# Patient Record
Sex: Male | Born: 1960
Health system: Southern US, Community
[De-identification: ages and names within clinical notes are randomized; demographics above are authoritative.]

## PROBLEM LIST (undated history)

## (undated) DIAGNOSIS — F32A Depression, unspecified: Secondary | ICD-10-CM

## (undated) DIAGNOSIS — I10 Essential (primary) hypertension: Secondary | ICD-10-CM

## (undated) DIAGNOSIS — F329 Major depressive disorder, single episode, unspecified: Secondary | ICD-10-CM

## (undated) DIAGNOSIS — E785 Hyperlipidemia, unspecified: Secondary | ICD-10-CM

## (undated) DIAGNOSIS — K219 Gastro-esophageal reflux disease without esophagitis: Secondary | ICD-10-CM

## (undated) DIAGNOSIS — J189 Pneumonia, unspecified organism: Secondary | ICD-10-CM

## (undated) DIAGNOSIS — S22069A Unspecified fracture of T7-T8 vertebra, initial encounter for closed fracture: Secondary | ICD-10-CM

## (undated) DIAGNOSIS — H7290 Unspecified perforation of tympanic membrane, unspecified ear: Secondary | ICD-10-CM

## (undated) DIAGNOSIS — D126 Benign neoplasm of colon, unspecified: Secondary | ICD-10-CM

## (undated) HISTORY — PX: FRACTURE SURGERY: SHX138

## (undated) HISTORY — DX: Unspecified perforation of tympanic membrane, unspecified ear: H72.90

## (undated) HISTORY — PX: COLONOSCOPY: SHX174

## (undated) HISTORY — DX: Hyperlipidemia, unspecified: E78.5

## (undated) HISTORY — PX: SPINE SURGERY: SHX786

## (undated) HISTORY — PX: EYE SURGERY: SHX253

## (undated) HISTORY — DX: Essential (primary) hypertension: I10

## (undated) HISTORY — PX: LIPOMA EXCISION: SHX5283

## (undated) HISTORY — PX: CARPAL TUNNEL RELEASE: SHX101

## (undated) HISTORY — PX: HERNIA REPAIR: SHX51

---

## 2006-05-13 ENCOUNTER — Ambulatory Visit: Payer: Self-pay | Admitting: Unknown Physician Specialty

## 2006-05-27 ENCOUNTER — Ambulatory Visit: Payer: Self-pay | Admitting: Internal Medicine

## 2006-07-10 ENCOUNTER — Encounter: Payer: Self-pay | Admitting: Internal Medicine

## 2006-07-31 ENCOUNTER — Encounter: Payer: Self-pay | Admitting: Internal Medicine

## 2006-09-15 ENCOUNTER — Ambulatory Visit: Payer: Self-pay | Admitting: Gastroenterology

## 2007-10-24 ENCOUNTER — Emergency Department: Payer: Self-pay | Admitting: Emergency Medicine

## 2007-11-24 ENCOUNTER — Ambulatory Visit: Payer: Self-pay | Admitting: Gastroenterology

## 2008-06-17 ENCOUNTER — Encounter: Payer: Self-pay | Admitting: Unknown Physician Specialty

## 2008-08-24 ENCOUNTER — Ambulatory Visit: Payer: Self-pay | Admitting: Neurosurgery

## 2009-01-03 ENCOUNTER — Encounter: Payer: Self-pay | Admitting: Internal Medicine

## 2009-01-28 ENCOUNTER — Encounter: Payer: Self-pay | Admitting: Internal Medicine

## 2009-02-28 ENCOUNTER — Encounter: Payer: Self-pay | Admitting: Internal Medicine

## 2009-04-11 ENCOUNTER — Ambulatory Visit: Payer: Self-pay | Admitting: General Practice

## 2009-06-22 ENCOUNTER — Inpatient Hospital Stay: Payer: Self-pay | Admitting: Psychiatry

## 2009-08-07 ENCOUNTER — Other Ambulatory Visit: Payer: Self-pay | Admitting: Psychiatry

## 2009-08-21 ENCOUNTER — Ambulatory Visit: Payer: Self-pay | Admitting: Psychiatry

## 2009-09-13 ENCOUNTER — Other Ambulatory Visit: Payer: Self-pay | Admitting: Psychiatry

## 2009-09-18 ENCOUNTER — Ambulatory Visit: Payer: Self-pay | Admitting: Unknown Physician Specialty

## 2014-05-03 ENCOUNTER — Observation Stay: Payer: Self-pay | Admitting: Internal Medicine

## 2014-05-03 LAB — PLATELET COUNT: Platelet: 172 10*3/uL (ref 150–440)

## 2014-05-04 LAB — CBC WITH DIFFERENTIAL/PLATELET
Basophil #: 0 10*3/uL (ref 0.0–0.1)
Basophil %: 0.3 %
Eosinophil #: 0.1 10*3/uL (ref 0.0–0.7)
Eosinophil %: 1.1 %
HCT: 44.2 % (ref 40.0–52.0)
HGB: 15.3 g/dL (ref 13.0–18.0)
Lymphocyte #: 2.1 10*3/uL (ref 1.0–3.6)
Lymphocyte %: 27.8 %
MCH: 32.5 pg (ref 26.0–34.0)
MCHC: 34.7 g/dL (ref 32.0–36.0)
MCV: 94 fL (ref 80–100)
Monocyte #: 1 x10 3/mm (ref 0.2–1.0)
Monocyte %: 13.2 %
Neutrophil #: 4.4 10*3/uL (ref 1.4–6.5)
Neutrophil %: 57.6 %
Platelet: 163 10*3/uL (ref 150–440)
RBC: 4.72 10*6/uL (ref 4.40–5.90)
RDW: 12.2 % (ref 11.5–14.5)
WBC: 7.7 10*3/uL (ref 3.8–10.6)

## 2014-05-04 LAB — BASIC METABOLIC PANEL
Anion Gap: 3 — ABNORMAL LOW (ref 7–16)
BUN: 11 mg/dL (ref 7–18)
Calcium, Total: 9.1 mg/dL (ref 8.5–10.1)
Chloride: 105 mmol/L (ref 98–107)
Co2: 32 mmol/L (ref 21–32)
Creatinine: 1.06 mg/dL (ref 0.60–1.30)
EGFR (African American): >60
EGFR (Non-African Amer.): >60
Glucose: 86 mg/dL (ref 65–99)
Osmolality: 278 (ref 275–301)
Potassium: 4.3 mmol/L (ref 3.5–5.1)
Sodium: 140 mmol/L (ref 136–145)

## 2014-05-05 LAB — CBC WITH DIFFERENTIAL/PLATELET
Basophil #: 0 10*3/uL (ref 0.0–0.1)
Basophil %: 0.3 %
Eosinophil #: 0.1 10*3/uL (ref 0.0–0.7)
Eosinophil %: 1.6 %
HCT: 44.2 % (ref 40.0–52.0)
HGB: 15.4 g/dL (ref 13.0–18.0)
Lymphocyte #: 2 10*3/uL (ref 1.0–3.6)
Lymphocyte %: 28.2 %
MCH: 32.3 pg (ref 26.0–34.0)
MCHC: 34.7 g/dL (ref 32.0–36.0)
MCV: 93 fL (ref 80–100)
Monocyte #: 1 x10 3/mm (ref 0.2–1.0)
Monocyte %: 13.8 %
Neutrophil #: 3.9 10*3/uL (ref 1.4–6.5)
Neutrophil %: 56.1 %
Platelet: 164 10*3/uL (ref 150–440)
RBC: 4.75 10*6/uL (ref 4.40–5.90)
RDW: 12 % (ref 11.5–14.5)
WBC: 7 10*3/uL (ref 3.8–10.6)

## 2014-05-25 ENCOUNTER — Encounter: Payer: Self-pay | Admitting: Internal Medicine

## 2014-05-31 ENCOUNTER — Encounter: Payer: Self-pay | Admitting: Internal Medicine

## 2014-06-30 ENCOUNTER — Encounter: Payer: Self-pay | Admitting: Internal Medicine

## 2014-09-15 ENCOUNTER — Ambulatory Visit: Payer: Self-pay | Admitting: Unknown Physician Specialty

## 2015-01-21 NOTE — Consult Note (Signed)
Brief Consult Note: Diagnosis: left shoulder strain.   Patient was seen by consultant.   Recommend further assessment or treatment.   Orders entered.   Comments: xray ordered, suspect cuff strain.  Electronic Signatures: Laurene Footman (MD)  (Signed 04-Aug-15 12:50)  Authored: Brief Consult Note   Last Updated: 04-Aug-15 12:50 by Laurene Footman (MD)
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
## 2015-01-23 LAB — SURGICAL PATHOLOGY

## 2015-08-28 ENCOUNTER — Ambulatory Visit: Payer: PPO | Attending: Rehabilitation | Admitting: Occupational Therapy

## 2015-08-28 ENCOUNTER — Encounter: Payer: Self-pay | Admitting: Occupational Therapy

## 2015-08-28 DIAGNOSIS — R278 Other lack of coordination: Secondary | ICD-10-CM | POA: Diagnosis present

## 2015-08-28 NOTE — Therapy (Signed)
[place] MAIN [facility] SERVICES [address] [place], [place], [address] Phone: [phone]   Fax:  [phone]  Occupational Therapy Evaluation  Patient Details  Name: Eddie Chapman MRN: [id number] Date of Birth: [birth year] No Data Recorded  Encounter Date: 08-30-2015      OT End of Session - 08-30-2015 1705    Visit Number 1   Number of Visits 1      No past medical history on file.  Past Surgical History  Procedure Laterality Date  . Eye surgery      There were no vitals filed for this visit.  Visit Diagnosis:  Muscular incoordination - Plan: Ot plan of care cert/re-cert      Subjective Assessment - 30-Aug-2015 1621    Subjective  I was in [facility]   Pertinent History 54 year old male who came to [facility] outpatient after a fall of a ladder suffering a rib fracture coller bone fracture and closed head injury.   Currently in Pain? Yes  Only hurts when he pulls things.   Pain Score 7   only when pulling    Patient range of motion, sensation, strength, and basic ADL are with in functional limits. No further Occupational Therapy needed. Patient to have a   Physical Therapy and Speech evaluation.  .                                   Plan - Aug 30, 2015 1630    Clinical Impression Statement B UE 5/5 except left shoulder flexion 4+/5 Grip Right 85 lbs - Left 73 - 3 jaw pinch left 20 lbs right 20 lbs. Sensation normal for light touch, sharp, temp, and stereognosis.9 hole peg test 26 sec on left and right 18 seconds.  Patient is retired and is doing all his basic ADL.  No further OT needed. Patient to be evaluated by PT and Speech Therapy.   OT Frequency 1x / week   OT Duration --  One week   Consulted and Agree with Plan of Care Patient;Family member/caregiver   Family Member Consulted Wife          G-Codes - 2015/08/30 1717    Functional Assessment Tool Used clinical observation   Functional Limitation Self care   Self Care Current Status [phone]) At least 80 percent but less than 100 percent impaired, limited or restricted   Self Care Goal Status [id number]) At least 80 percent but less than 100 percent impaired, limited or restricted   Self Care Discharge Status [id number]) At least 80 percent but less than 100 percent impaired, limited or restricted      Problem List There are no active problems to display for this patient.  Sharon Mt, MS/OTR/L  Kyus, Zerkel 30-Aug-2015, 5:28 PM  [place] MAIN [facility] SERVICES [address] [place], [place], [address] Phone: [phone]   Fax:  [phone]  Name: Eddie Chapman MRN: [id number] Date of Birth: 10/31/1960

## 2015-08-30 ENCOUNTER — Ambulatory Visit: Payer: PPO | Admitting: Occupational Therapy

## 2015-08-30 ENCOUNTER — Ambulatory Visit: Payer: PPO | Admitting: Speech Pathology

## 2015-09-05 ENCOUNTER — Ambulatory Visit: Payer: PPO | Attending: Rehabilitation | Admitting: Speech Pathology

## 2015-09-05 ENCOUNTER — Ambulatory Visit: Payer: PPO | Admitting: Occupational Therapy

## 2015-09-05 ENCOUNTER — Encounter: Payer: Self-pay | Admitting: Speech Pathology

## 2015-09-05 DIAGNOSIS — R262 Difficulty in walking, not elsewhere classified: Secondary | ICD-10-CM | POA: Diagnosis present

## 2015-09-05 DIAGNOSIS — R41841 Cognitive communication deficit: Secondary | ICD-10-CM | POA: Diagnosis present

## 2015-09-05 NOTE — Therapy (Signed)
[place] MAIN [facility] SERVICES [address] [place], [place], [address] Phone: [phone]   Fax:  [phone]  Speech Language Pathology Evaluation  Patient Details  Name: Eddie Chapman MRN: [id number] Date of Birth: [birth year] Referring Provider: Dr. Cleotilde Neer  Encounter Date: 2015-09-18      End of Session - September 18, 2015 1523    Visit Number 1   Number of Visits 1   Date for SLP Re-Evaluation 18-Sep-2015   SLP Start Time 1400   SLP Stop Time  1450   SLP Time Calculation (min) 50 min   Activity Tolerance Patient tolerated treatment well      History reviewed. No pertinent past medical history.  Past Surgical History  Procedure Laterality Date  . Eye surgery      There were no vitals filed for this visit.  Visit Diagnosis: Cognitive communication deficit - Plan: SLP plan of care cert/re-cert      Subjective Assessment - September 18, 2015 1522    Subjective The patient and his wife report no cognitive changes.   Patient is accompained by: Family member   Currently in Pain? No/denies            SLP Evaluation [facility] - 18-Sep-2015 0001    SLP Visit Information   SLP Received On 18-Sep-2015   Referring Provider Dr. Cleotilde Neer   Onset Date 07/16/2015   Medical Diagnosis Brain injury   Standardized Assessments   Standardized Assessments  Montreal Cognitive Assessment (MOCA)        Montreal Cognitive Assessment [facility])  Version: 7.1   Visuospatieal/Executive    Alternating trail making       1/1    Visuoconstruction Skills (copy 3-d design) 1/1    Draw a clock    3/3  Naming     3/3  Attention    Forward digit span    0/1    Backward digit span   0/1    Vigilance     1/1    Serial 7's     2/3  Language     Verbal Fluency    1/1    Repetition     2/2  Abstraction     2/2  Delayed Recall    3/5  Category cue helpful  Orientation     6/6  TOTAL     26/30 Within normal limits                    SLP  Education - September 18, 2015 1522    Education provided Yes   Education Details Use of Brain Games for cognitive stimulation   Person(s) Educated Patient;Spouse   Methods Explanation   Comprehension Verbalized understanding              Plan - 2015-09-18 1524    Clinical Impression Statement At 7 weeks post onset of brain injury, the patient is presenting with minimal cognitive-communication impairment (per testing with the [facility] Cognitive Assessment and interview with the patient and spouse).   Per the patient and his wife, the patient is at his baseline and he is fully functional at home.  There are no skilled needs and speech therapy is not indicated at this time.   Speech Therapy Frequency One time visit   Treatment/Interventions Patient/family education   Potential to Achieve Goals Good   Consulted and Agree with Plan of Care Patient;Family member/caregiver          G-Codes - 09/18/15 1528    Functional  Assessment Tool Used MOCA   Functional Limitations Memory   Memory Current Status [phone]) At least 1 percent but less than 20 percent impaired, limited or restricted   Memory Goal Status [id number]) At least 1 percent but less than 20 percent impaired, limited or restricted   Memory Discharge Status ([id number]) At least 1 percent but less than 20 percent impaired, limited or restricted      Problem List There are no active problems to display for this patient.   Lou Miner 09/05/2015, 3:31 PM  [place] MAIN [facility] SERVICES [address] [place], [place], [address] Phone: [phone]   Fax:  [phone]  Name: Eddie Chapman MRN: [id number] Date of Birth: 06/29/1961

## 2015-09-06 ENCOUNTER — Ambulatory Visit: Payer: PPO | Admitting: Physical Therapy

## 2015-09-07 ENCOUNTER — Ambulatory Visit: Payer: PPO | Admitting: Occupational Therapy

## 2015-09-07 ENCOUNTER — Ambulatory Visit: Payer: PPO | Admitting: Speech Pathology

## 2015-09-12 ENCOUNTER — Ambulatory Visit: Payer: PPO | Admitting: Occupational Therapy

## 2015-09-12 ENCOUNTER — Ambulatory Visit: Payer: PPO | Admitting: Physical Therapy

## 2015-09-12 ENCOUNTER — Encounter: Payer: Self-pay | Admitting: Physical Therapy

## 2015-09-12 ENCOUNTER — Ambulatory Visit: Payer: PPO | Admitting: Speech Pathology

## 2015-09-12 DIAGNOSIS — R262 Difficulty in walking, not elsewhere classified: Secondary | ICD-10-CM

## 2015-09-12 DIAGNOSIS — R41841 Cognitive communication deficit: Secondary | ICD-10-CM | POA: Diagnosis not present
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
## 2015-10-06 DIAGNOSIS — I671 Cerebral aneurysm, nonruptured: Secondary | ICD-10-CM | POA: Diagnosis not present

## 2015-10-06 DIAGNOSIS — I612 Nontraumatic intracerebral hemorrhage in hemisphere, unspecified: Secondary | ICD-10-CM | POA: Diagnosis not present

## 2015-10-06 DIAGNOSIS — S069X0S Unspecified intracranial injury without loss of consciousness, sequela: Secondary | ICD-10-CM | POA: Diagnosis not present

## 2015-10-06 DIAGNOSIS — G9389 Other specified disorders of brain: Secondary | ICD-10-CM | POA: Diagnosis not present

## 2015-10-06 DIAGNOSIS — Z6822 Body mass index (BMI) 22.0-22.9, adult: Secondary | ICD-10-CM | POA: Diagnosis not present

## 2015-10-06 DIAGNOSIS — W11XXXS Fall on and from ladder, sequela: Secondary | ICD-10-CM | POA: Diagnosis not present

## 2015-10-17 DIAGNOSIS — J3089 Other allergic rhinitis: Secondary | ICD-10-CM | POA: Diagnosis not present

## 2015-10-17 DIAGNOSIS — J342 Deviated nasal septum: Secondary | ICD-10-CM | POA: Diagnosis not present

## 2015-10-17 DIAGNOSIS — H7292 Unspecified perforation of tympanic membrane, left ear: Secondary | ICD-10-CM | POA: Diagnosis not present

## 2015-10-17 DIAGNOSIS — J3489 Other specified disorders of nose and nasal sinuses: Secondary | ICD-10-CM | POA: Diagnosis not present

## 2015-12-12 DIAGNOSIS — M5030 Other cervical disc degeneration, unspecified cervical region: Secondary | ICD-10-CM | POA: Diagnosis not present

## 2015-12-12 DIAGNOSIS — I10 Essential (primary) hypertension: Secondary | ICD-10-CM | POA: Diagnosis not present

## 2015-12-12 DIAGNOSIS — K219 Gastro-esophageal reflux disease without esophagitis: Secondary | ICD-10-CM | POA: Diagnosis not present

## 2015-12-12 DIAGNOSIS — R972 Elevated prostate specific antigen [PSA]: Secondary | ICD-10-CM | POA: Diagnosis not present

## 2015-12-19 DIAGNOSIS — R739 Hyperglycemia, unspecified: Secondary | ICD-10-CM | POA: Diagnosis not present

## 2015-12-19 DIAGNOSIS — F3342 Major depressive disorder, recurrent, in full remission: Secondary | ICD-10-CM | POA: Diagnosis not present

## 2015-12-19 DIAGNOSIS — E291 Testicular hypofunction: Secondary | ICD-10-CM | POA: Diagnosis not present

## 2015-12-19 DIAGNOSIS — I10 Essential (primary) hypertension: Secondary | ICD-10-CM | POA: Diagnosis not present

## 2015-12-19 DIAGNOSIS — E784 Other hyperlipidemia: Secondary | ICD-10-CM | POA: Diagnosis not present

## 2015-12-19 DIAGNOSIS — E559 Vitamin D deficiency, unspecified: Secondary | ICD-10-CM | POA: Diagnosis not present

## 2015-12-19 DIAGNOSIS — M5030 Other cervical disc degeneration, unspecified cervical region: Secondary | ICD-10-CM | POA: Diagnosis not present

## 2015-12-19 DIAGNOSIS — I671 Cerebral aneurysm, nonruptured: Secondary | ICD-10-CM | POA: Diagnosis not present

## 2015-12-19 DIAGNOSIS — K219 Gastro-esophageal reflux disease without esophagitis: Secondary | ICD-10-CM | POA: Diagnosis not present

## 2016-02-29 ENCOUNTER — Other Ambulatory Visit: Payer: Self-pay | Admitting: Internal Medicine

## 2016-03-01 ENCOUNTER — Other Ambulatory Visit: Payer: Self-pay | Admitting: Internal Medicine

## 2016-03-01 DIAGNOSIS — I671 Cerebral aneurysm, nonruptured: Secondary | ICD-10-CM

## 2016-03-19 DIAGNOSIS — H903 Sensorineural hearing loss, bilateral: Secondary | ICD-10-CM | POA: Diagnosis not present

## 2016-03-22 ENCOUNTER — Ambulatory Visit
Admission: RE | Admit: 2016-03-22 | Discharge: 2016-03-22 | Disposition: A | Discharge status: Home / Self Care | Payer: PPO | Source: Ambulatory Visit | Attending: Internal Medicine | Admitting: Internal Medicine

## 2016-03-22 DIAGNOSIS — I6502 Occlusion and stenosis of left vertebral artery: Secondary | ICD-10-CM | POA: Diagnosis not present

## 2016-03-22 DIAGNOSIS — I671 Cerebral aneurysm, nonruptured: Secondary | ICD-10-CM | POA: Diagnosis not present

## 2016-07-03 DIAGNOSIS — I671 Cerebral aneurysm, nonruptured: Secondary | ICD-10-CM | POA: Diagnosis not present

## 2016-07-03 DIAGNOSIS — E349 Endocrine disorder, unspecified: Secondary | ICD-10-CM | POA: Diagnosis not present

## 2016-07-03 DIAGNOSIS — E559 Vitamin D deficiency, unspecified: Secondary | ICD-10-CM | POA: Diagnosis not present

## 2016-07-03 DIAGNOSIS — R739 Hyperglycemia, unspecified: Secondary | ICD-10-CM | POA: Diagnosis not present

## 2016-07-03 DIAGNOSIS — M5030 Other cervical disc degeneration, unspecified cervical region: Secondary | ICD-10-CM | POA: Diagnosis not present

## 2016-07-03 DIAGNOSIS — Z79899 Other long term (current) drug therapy: Secondary | ICD-10-CM | POA: Diagnosis not present

## 2016-07-03 DIAGNOSIS — I10 Essential (primary) hypertension: Secondary | ICD-10-CM | POA: Diagnosis not present

## 2016-07-24 DIAGNOSIS — Z23 Encounter for immunization: Secondary | ICD-10-CM | POA: Diagnosis not present

## 2016-07-24 DIAGNOSIS — Z79899 Other long term (current) drug therapy: Secondary | ICD-10-CM | POA: Diagnosis not present

## 2016-07-24 DIAGNOSIS — F3342 Major depressive disorder, recurrent, in full remission: Secondary | ICD-10-CM | POA: Diagnosis not present

## 2016-07-24 DIAGNOSIS — E349 Endocrine disorder, unspecified: Secondary | ICD-10-CM | POA: Diagnosis not present

## 2016-07-24 DIAGNOSIS — I671 Cerebral aneurysm, nonruptured: Secondary | ICD-10-CM | POA: Diagnosis not present

## 2016-07-24 DIAGNOSIS — E559 Vitamin D deficiency, unspecified: Secondary | ICD-10-CM | POA: Diagnosis not present

## 2016-07-24 DIAGNOSIS — K219 Gastro-esophageal reflux disease without esophagitis: Secondary | ICD-10-CM | POA: Diagnosis not present

## 2016-07-24 DIAGNOSIS — E784 Other hyperlipidemia: Secondary | ICD-10-CM | POA: Diagnosis not present

## 2016-07-24 DIAGNOSIS — M5030 Other cervical disc degeneration, unspecified cervical region: Secondary | ICD-10-CM | POA: Diagnosis not present

## 2016-07-24 DIAGNOSIS — I10 Essential (primary) hypertension: Secondary | ICD-10-CM | POA: Diagnosis not present

## 2016-07-26 DIAGNOSIS — R42 Dizziness and giddiness: Secondary | ICD-10-CM | POA: Diagnosis not present

## 2016-07-26 DIAGNOSIS — H903 Sensorineural hearing loss, bilateral: Secondary | ICD-10-CM | POA: Diagnosis not present

## 2016-08-19 ENCOUNTER — Encounter (INDEPENDENT_AMBULATORY_CARE_PROVIDER_SITE_OTHER): Payer: Self-pay | Admitting: Vascular Surgery

## 2016-08-19 ENCOUNTER — Ambulatory Visit (INDEPENDENT_AMBULATORY_CARE_PROVIDER_SITE_OTHER): Payer: PPO | Admitting: Vascular Surgery

## 2016-08-19 ENCOUNTER — Encounter (INDEPENDENT_AMBULATORY_CARE_PROVIDER_SITE_OTHER): Payer: Self-pay

## 2016-08-19 DIAGNOSIS — I671 Cerebral aneurysm, nonruptured: Secondary | ICD-10-CM | POA: Diagnosis not present

## 2016-08-19 DIAGNOSIS — E785 Hyperlipidemia, unspecified: Secondary | ICD-10-CM | POA: Insufficient documentation

## 2016-08-19 DIAGNOSIS — I6502 Occlusion and stenosis of left vertebral artery: Secondary | ICD-10-CM

## 2016-08-19 DIAGNOSIS — I10 Essential (primary) hypertension: Secondary | ICD-10-CM | POA: Diagnosis not present

## 2016-08-19 DIAGNOSIS — I6509 Occlusion and stenosis of unspecified vertebral artery: Secondary | ICD-10-CM | POA: Insufficient documentation

## 2016-08-19 DIAGNOSIS — E782 Mixed hyperlipidemia: Secondary | ICD-10-CM | POA: Diagnosis not present

## 2016-08-19 NOTE — Progress Notes (Signed)
MRN : [id number]  Eddie Chapman is a 55 y.o. (06/29/1961) male who presents with chief complaint of  Chief Complaint  Patient presents with  . New Evaluation    Vertebral artery stenosis  .  History of Present Illness: The patient is seen At the request of Dr Carmin Richmond for evaluation of left vertebral stenosis. The vertebral stenosis was identified after an MRI.  The MRI was obtained as part of the work up for hearing loss.  The patient denies vertigo or syncope. There is no recent history of TIA symptoms or focal motor deficits. There is no prior documented CVA.  The patient denies amaurosis fugax.  There is no history of migraine headaches. There is no history of seizures.  The patient is taking enteric-coated aspirin 81 mg daily.  The patient has a history of coronary artery disease, no recent episodes of angina or shortness of breath. The patient denies PAD or claudication symptoms. There is a history of hyperlipidemia which is being treated with a statin.    Current Meds  Medication Sig  . amLODipine (NORVASC) 10 MG tablet Take by mouth.  Marland Kitchen aspirin EC 81 MG tablet Take 162 mg by mouth.  . Calcium Citrate-Vitamin D3 1000-400 LIQD Take by mouth.  . carisoprodol (SOMA) 350 MG tablet TAKE ONE TABLET BY MOUTH TWICE DAILY AS NEEDED  . cloNIDine (CATAPRES) 0.1 MG tablet Take 0.1 mg by mouth.  . diazepam (VALIUM) 10 MG tablet   . diphenhydrAMINE (BENADRYL) 25 mg capsule Take by mouth.  . DULoxetine (CYMBALTA) 60 MG capsule Take 60 mg by mouth.  . fluticasone (FLONASE) 50 MCG/ACT nasal spray Place into the nose.  Marland Kitchen HYDROcodone-acetaminophen (NORCO/VICODIN) 5-325 MG tablet   . losartan (COZAAR) 25 MG tablet Take 25 mg by mouth.  . lovastatin (MEVACOR) 40 MG tablet   . meclizine (ANTIVERT) 25 MG tablet TAKE ONE TABLET BY MOUTH THREE TIMES DAILY AS NEEDED FOR DIZZINESS FOR  UP  TO  10  DAYS  . Omega-3 Fatty Acids (FISH OIL PO) Take by mouth.  Marland Kitchen omeprazole (PRILOSEC) 20 MG capsule  Take by mouth.  . propranolol (INDERAL) 20 MG tablet Take 20 mg by mouth.  . traZODone (DESYREL) 50 MG tablet Take by mouth.  . vitamin E 1000 UNIT capsule Take by mouth.    Past Medical History:  Diagnosis Date  . Ear drum perforation    left x2   . Hyperlipidemia   . Hypertension     Past Surgical History:  Procedure Laterality Date  . CARPAL TUNNEL RELEASE     left hand  . EYE SURGERY    . FRACTURE SURGERY    . SPINE SURGERY      Social History Social History  Substance Use Topics  . Smoking status: Never Smoker  . Smokeless tobacco: Never Used  . Alcohol use Not on file    Family History Family History  Problem Relation Age of Onset  . Cancer Mother   . Varicose Veins Mother   No family history of bleeding/clotting disorders, porphyria or autoimmune disease   Allergies  Allergen Reactions  . Divalproex Sodium Other (See Comments)    Elevated liver enzymes  . Valproic Acid     unknown     REVIEW OF SYSTEMS (Negative unless checked)  Constitutional: [] Weight loss  [] Fever  [] Chills Cardiac: [] Chest pain   [] Chest pressure   [] Palpitations   [] Shortness of breath when laying flat   [] Shortness of breath with  exertion. Vascular:  [] Pain in legs with walking   [] Pain in legs at rest  [] History of DVT   [] Phlebitis   [] Swelling in legs   [] Varicose veins   [] Non-healing ulcers Pulmonary:   [] Uses home oxygen   [] Productive cough   [] Hemoptysis   [] Wheeze  [] COPD   [] Asthma Neurologic:  [] Dizziness   [] Seizures   [] History of stroke   [] History of TIA  [] Aphasia   [] Vissual changes   [] Weakness or numbness in arm   [] Weakness or numbness in leg Musculoskeletal:   [] Joint swelling   [] Joint pain   [] Low back pain Hematologic:  [] Easy bruising  [] Easy bleeding   [] Hypercoagulable state   [] Anemic Gastrointestinal:  [] Diarrhea   [] Vomiting  [x] Gastroesophageal reflux/heartburn   [] Difficulty swallowing. Genitourinary:  [] Chronic kidney disease   [] Difficult  urination  [] Frequent urination   [] Blood in urine Skin:  [] Rashes   [] Ulcers  Psychological:  [] History of anxiety   []  History of major depression.  Physical Examination  Vitals:   08/19/16 1533  BP: 118/77  Pulse: (!) 59  Resp: 16  Weight: 242 lb (109.8 kg)  Height: 5\' 10"  (1.778 m)   Body mass index is 34.72 kg/m. Gen: WD/WN, NAD Head: [place]/AT, No temporalis wasting.  Ear/Nose/Throat: Hearing grossly intact, nares w/o erythema or drainage, poor dentition Eyes: PER, EOMI, sclera nonicteric.  Neck: Supple, no masses.  No bruit or JVD.  Pulmonary:  Good air movement, clear to auscultation bilaterally, no use of accessory muscles.  Cardiac: RRR, normal S1, S2, no Murmurs. Vascular: No bruits auscultated Vessel Right Left  Radial Palpable Palpable  Ulnar Palpable Palpable  Brachial Palpable Palpable  Carotid Palpable Palpable  Femoral Palpable Palpable  Popliteal Palpable Palpable  PT Palpable Palpable  DP Palpable Palpable   Gastrointestinal: soft, non-distended. No guarding/no peritoneal signs.  Musculoskeletal: M/S 5/5 throughout.  No deformity or atrophy.  Neurologic: CN 2-12 intact. Pain and light touch intact in extremities.  Symmetrical.  Speech is fluent. Motor exam as listed above. Psychiatric: Judgment intact, Mood & affect appropriate for pt's clinical situation. Dermatologic: No rashes or ulcers noted.  No changes consistent with cellulitis. Lymph : No Cervical lymphadenopathy, no lichenification or skin changes of chronic lymphedema.  CBC Lab Results  Component Value Date   WBC 7.0 05/05/2014   HGB 15.4 05/05/2014   HCT 44.2 05/05/2014   MCV 93 05/05/2014   PLT 164 05/05/2014    BMET    Component Value Date/Time   NA 140 05/04/2014 0444   K 4.3 05/04/2014 0444   CL 105 05/04/2014 0444   CO2 32 05/04/2014 0444   GLUCOSE 86 05/04/2014 0444   BUN 11 05/04/2014 0444   CREATININE 1.06 05/04/2014 0444   CALCIUM 9.1 05/04/2014 0444   GFRNONAA >60  05/04/2014 0444   GFRAA >60 05/04/2014 0444   CrCl cannot be calculated (Patient's most recent lab result is older than the maximum 21 days allowed.).  COAG No results found for: INR, PROTIME  Radiology No results found.  Assessment/Plan 1. Occlusion and stenosis of left vertebral artery I have reviewed the MRA the vertebral artery stenosis is on the left in the distal portion. The right is widely patent and supplies a normal basilar.  The degree of stenosis in the left is moderate and does not need intervention at this time but if it does progress in this distal location he would require neurosurgical intervention.  2. Aneurysm, cerebral, nonruptured I do not treat cerebral aneurysms  this should be addressed by a neurointerventionalist who should likely follow issue #1  3. Essential hypertension Continue antihypertensive medications as already ordered and reviewed, no changes at this time.  4. Mixed hyperlipidemia Continue statin as ordered and reviewed, no changes at this time    Hortencia Pilar, MD  08/19/2016 5:32 PM

## 2016-10-21 DIAGNOSIS — I10 Essential (primary) hypertension: Secondary | ICD-10-CM | POA: Diagnosis not present

## 2016-10-21 DIAGNOSIS — Z79899 Other long term (current) drug therapy: Secondary | ICD-10-CM | POA: Diagnosis not present

## 2016-10-28 DIAGNOSIS — E349 Endocrine disorder, unspecified: Secondary | ICD-10-CM | POA: Diagnosis not present

## 2016-10-28 DIAGNOSIS — I739 Peripheral vascular disease, unspecified: Secondary | ICD-10-CM | POA: Diagnosis not present

## 2016-10-28 DIAGNOSIS — G894 Chronic pain syndrome: Secondary | ICD-10-CM | POA: Diagnosis not present

## 2016-10-28 DIAGNOSIS — E559 Vitamin D deficiency, unspecified: Secondary | ICD-10-CM | POA: Diagnosis not present

## 2016-10-28 DIAGNOSIS — E784 Other hyperlipidemia: Secondary | ICD-10-CM | POA: Diagnosis not present

## 2016-10-28 DIAGNOSIS — F3342 Major depressive disorder, recurrent, in full remission: Secondary | ICD-10-CM | POA: Diagnosis not present

## 2016-10-28 DIAGNOSIS — I671 Cerebral aneurysm, nonruptured: Secondary | ICD-10-CM | POA: Diagnosis not present

## 2016-10-28 DIAGNOSIS — I10 Essential (primary) hypertension: Secondary | ICD-10-CM | POA: Diagnosis not present

## 2016-10-28 DIAGNOSIS — K219 Gastro-esophageal reflux disease without esophagitis: Secondary | ICD-10-CM | POA: Diagnosis not present

## 2016-10-28 DIAGNOSIS — S22060S Wedge compression fracture of T7-T8 vertebra, sequela: Secondary | ICD-10-CM | POA: Diagnosis not present

## 2016-10-28 DIAGNOSIS — M5030 Other cervical disc degeneration, unspecified cervical region: Secondary | ICD-10-CM | POA: Diagnosis not present

## 2017-01-20 DIAGNOSIS — I671 Cerebral aneurysm, nonruptured: Secondary | ICD-10-CM | POA: Diagnosis not present

## 2017-01-20 DIAGNOSIS — E349 Endocrine disorder, unspecified: Secondary | ICD-10-CM | POA: Diagnosis not present

## 2017-01-20 DIAGNOSIS — E559 Vitamin D deficiency, unspecified: Secondary | ICD-10-CM | POA: Diagnosis not present

## 2017-01-20 DIAGNOSIS — M5030 Other cervical disc degeneration, unspecified cervical region: Secondary | ICD-10-CM | POA: Diagnosis not present

## 2017-01-20 DIAGNOSIS — I10 Essential (primary) hypertension: Secondary | ICD-10-CM | POA: Diagnosis not present

## 2017-01-24 DIAGNOSIS — H903 Sensorineural hearing loss, bilateral: Secondary | ICD-10-CM | POA: Diagnosis not present

## 2017-01-24 DIAGNOSIS — R42 Dizziness and giddiness: Secondary | ICD-10-CM | POA: Diagnosis not present

## 2017-01-27 DIAGNOSIS — M5412 Radiculopathy, cervical region: Secondary | ICD-10-CM | POA: Diagnosis not present

## 2017-01-27 DIAGNOSIS — E784 Other hyperlipidemia: Secondary | ICD-10-CM | POA: Diagnosis not present

## 2017-01-27 DIAGNOSIS — I10 Essential (primary) hypertension: Secondary | ICD-10-CM | POA: Diagnosis not present

## 2017-01-27 DIAGNOSIS — F3342 Major depressive disorder, recurrent, in full remission: Secondary | ICD-10-CM | POA: Diagnosis not present

## 2017-01-27 DIAGNOSIS — E349 Endocrine disorder, unspecified: Secondary | ICD-10-CM | POA: Diagnosis not present

## 2017-01-27 DIAGNOSIS — K219 Gastro-esophageal reflux disease without esophagitis: Secondary | ICD-10-CM | POA: Diagnosis not present

## 2017-01-27 DIAGNOSIS — G4734 Idiopathic sleep related nonobstructive alveolar hypoventilation: Secondary | ICD-10-CM | POA: Diagnosis not present

## 2017-01-27 DIAGNOSIS — I739 Peripheral vascular disease, unspecified: Secondary | ICD-10-CM | POA: Diagnosis not present

## 2017-01-27 DIAGNOSIS — E559 Vitamin D deficiency, unspecified: Secondary | ICD-10-CM | POA: Diagnosis not present

## 2017-01-27 DIAGNOSIS — G894 Chronic pain syndrome: Secondary | ICD-10-CM | POA: Diagnosis not present

## 2017-01-27 DIAGNOSIS — I671 Cerebral aneurysm, nonruptured: Secondary | ICD-10-CM | POA: Diagnosis not present

## 2017-04-25 DIAGNOSIS — H903 Sensorineural hearing loss, bilateral: Secondary | ICD-10-CM | POA: Diagnosis not present

## 2017-04-25 DIAGNOSIS — J301 Allergic rhinitis due to pollen: Secondary | ICD-10-CM | POA: Diagnosis not present

## 2017-04-25 DIAGNOSIS — R42 Dizziness and giddiness: Secondary | ICD-10-CM | POA: Diagnosis not present

## 2017-07-22 DIAGNOSIS — E785 Hyperlipidemia, unspecified: Secondary | ICD-10-CM | POA: Diagnosis not present

## 2017-07-22 DIAGNOSIS — I10 Essential (primary) hypertension: Secondary | ICD-10-CM | POA: Diagnosis not present

## 2017-07-22 DIAGNOSIS — I739 Peripheral vascular disease, unspecified: Secondary | ICD-10-CM | POA: Diagnosis not present

## 2017-07-22 DIAGNOSIS — I671 Cerebral aneurysm, nonruptured: Secondary | ICD-10-CM | POA: Diagnosis not present

## 2017-07-29 DIAGNOSIS — F3342 Major depressive disorder, recurrent, in full remission: Secondary | ICD-10-CM | POA: Diagnosis not present

## 2017-07-29 DIAGNOSIS — I10 Essential (primary) hypertension: Secondary | ICD-10-CM | POA: Diagnosis not present

## 2017-07-29 DIAGNOSIS — I671 Cerebral aneurysm, nonruptured: Secondary | ICD-10-CM | POA: Diagnosis not present

## 2017-07-29 DIAGNOSIS — K219 Gastro-esophageal reflux disease without esophagitis: Secondary | ICD-10-CM | POA: Diagnosis not present

## 2017-07-29 DIAGNOSIS — G894 Chronic pain syndrome: Secondary | ICD-10-CM | POA: Diagnosis not present

## 2017-07-29 DIAGNOSIS — M5030 Other cervical disc degeneration, unspecified cervical region: Secondary | ICD-10-CM | POA: Diagnosis not present

## 2017-07-29 DIAGNOSIS — E785 Hyperlipidemia, unspecified: Secondary | ICD-10-CM | POA: Diagnosis not present

## 2017-07-29 DIAGNOSIS — Z23 Encounter for immunization: Secondary | ICD-10-CM | POA: Diagnosis not present

## 2017-07-29 DIAGNOSIS — E349 Endocrine disorder, unspecified: Secondary | ICD-10-CM | POA: Diagnosis not present

## 2017-07-29 DIAGNOSIS — G4734 Idiopathic sleep related nonobstructive alveolar hypoventilation: Secondary | ICD-10-CM | POA: Diagnosis not present

## 2017-07-29 DIAGNOSIS — E559 Vitamin D deficiency, unspecified: Secondary | ICD-10-CM | POA: Diagnosis not present

## 2017-07-29 DIAGNOSIS — I739 Peripheral vascular disease, unspecified: Secondary | ICD-10-CM | POA: Diagnosis not present

## 2017-07-29 DIAGNOSIS — Z0000 Encounter for general adult medical examination without abnormal findings: Secondary | ICD-10-CM | POA: Diagnosis not present

## 2017-07-29 DIAGNOSIS — S22060S Wedge compression fracture of T7-T8 vertebra, sequela: Secondary | ICD-10-CM | POA: Diagnosis not present

## 2017-08-27 DIAGNOSIS — E785 Hyperlipidemia, unspecified: Secondary | ICD-10-CM | POA: Diagnosis not present

## 2017-08-27 DIAGNOSIS — K219 Gastro-esophageal reflux disease without esophagitis: Secondary | ICD-10-CM | POA: Diagnosis not present

## 2017-08-27 DIAGNOSIS — I10 Essential (primary) hypertension: Secondary | ICD-10-CM | POA: Diagnosis not present

## 2017-08-27 DIAGNOSIS — E559 Vitamin D deficiency, unspecified: Secondary | ICD-10-CM | POA: Diagnosis not present

## 2017-08-27 DIAGNOSIS — I671 Cerebral aneurysm, nonruptured: Secondary | ICD-10-CM | POA: Diagnosis not present

## 2017-08-27 DIAGNOSIS — F3342 Major depressive disorder, recurrent, in full remission: Secondary | ICD-10-CM | POA: Diagnosis not present

## 2017-08-27 DIAGNOSIS — E349 Endocrine disorder, unspecified: Secondary | ICD-10-CM | POA: Diagnosis not present

## 2017-08-27 DIAGNOSIS — I739 Peripheral vascular disease, unspecified: Secondary | ICD-10-CM | POA: Diagnosis not present

## 2017-08-27 DIAGNOSIS — G894 Chronic pain syndrome: Secondary | ICD-10-CM | POA: Diagnosis not present

## 2017-08-27 DIAGNOSIS — G4734 Idiopathic sleep related nonobstructive alveolar hypoventilation: Secondary | ICD-10-CM | POA: Diagnosis not present

## 2017-08-27 DIAGNOSIS — M5030 Other cervical disc degeneration, unspecified cervical region: Secondary | ICD-10-CM | POA: Diagnosis not present

## 2017-10-15 DIAGNOSIS — D2261 Melanocytic nevi of right upper limb, including shoulder: Secondary | ICD-10-CM | POA: Diagnosis not present

## 2017-10-15 DIAGNOSIS — L304 Erythema intertrigo: Secondary | ICD-10-CM | POA: Diagnosis not present

## 2017-10-15 DIAGNOSIS — D225 Melanocytic nevi of trunk: Secondary | ICD-10-CM | POA: Diagnosis not present

## 2017-10-15 DIAGNOSIS — L728 Other follicular cysts of the skin and subcutaneous tissue: Secondary | ICD-10-CM | POA: Diagnosis not present

## 2017-10-20 DIAGNOSIS — Z86010 Personal history of colonic polyps: Secondary | ICD-10-CM | POA: Diagnosis not present

## 2017-10-20 DIAGNOSIS — K219 Gastro-esophageal reflux disease without esophagitis: Secondary | ICD-10-CM | POA: Diagnosis not present

## 2017-10-20 DIAGNOSIS — I671 Cerebral aneurysm, nonruptured: Secondary | ICD-10-CM | POA: Diagnosis not present

## 2017-10-20 DIAGNOSIS — G894 Chronic pain syndrome: Secondary | ICD-10-CM | POA: Diagnosis not present

## 2017-10-20 DIAGNOSIS — G4734 Idiopathic sleep related nonobstructive alveolar hypoventilation: Secondary | ICD-10-CM | POA: Diagnosis not present

## 2017-10-28 DIAGNOSIS — H903 Sensorineural hearing loss, bilateral: Secondary | ICD-10-CM | POA: Diagnosis not present

## 2017-10-28 DIAGNOSIS — R42 Dizziness and giddiness: Secondary | ICD-10-CM | POA: Diagnosis not present

## 2017-10-28 DIAGNOSIS — J0100 Acute maxillary sinusitis, unspecified: Secondary | ICD-10-CM | POA: Diagnosis not present

## 2017-10-28 DIAGNOSIS — J301 Allergic rhinitis due to pollen: Secondary | ICD-10-CM | POA: Diagnosis not present

## 2017-12-29 ENCOUNTER — Other Ambulatory Visit: Payer: Self-pay

## 2017-12-29 ENCOUNTER — Encounter: Payer: Self-pay | Admitting: *Deleted

## 2017-12-29 ENCOUNTER — Ambulatory Visit
Admission: RE | Admit: 2017-12-29 | Discharge: 2017-12-29 | Disposition: A | Discharge status: Home / Self Care | Payer: PPO | Source: Ambulatory Visit | Attending: Unknown Physician Specialty | Admitting: Unknown Physician Specialty

## 2017-12-29 ENCOUNTER — Encounter
Admission: RE | Disposition: A | Discharge status: Home / Self Care | Payer: Self-pay | Source: Ambulatory Visit | Attending: Unknown Physician Specialty

## 2017-12-29 ENCOUNTER — Ambulatory Visit: Payer: PPO | Admitting: Anesthesiology

## 2017-12-29 DIAGNOSIS — K219 Gastro-esophageal reflux disease without esophagitis: Secondary | ICD-10-CM | POA: Insufficient documentation

## 2017-12-29 DIAGNOSIS — K621 Rectal polyp: Secondary | ICD-10-CM | POA: Diagnosis not present

## 2017-12-29 DIAGNOSIS — Z800 Family history of malignant neoplasm of digestive organs: Secondary | ICD-10-CM | POA: Insufficient documentation

## 2017-12-29 DIAGNOSIS — I739 Peripheral vascular disease, unspecified: Secondary | ICD-10-CM | POA: Insufficient documentation

## 2017-12-29 DIAGNOSIS — E785 Hyperlipidemia, unspecified: Secondary | ICD-10-CM | POA: Insufficient documentation

## 2017-12-29 DIAGNOSIS — K5730 Diverticulosis of large intestine without perforation or abscess without bleeding: Secondary | ICD-10-CM | POA: Insufficient documentation

## 2017-12-29 DIAGNOSIS — I10 Essential (primary) hypertension: Secondary | ICD-10-CM | POA: Diagnosis not present

## 2017-12-29 DIAGNOSIS — Z1211 Encounter for screening for malignant neoplasm of colon: Secondary | ICD-10-CM | POA: Insufficient documentation

## 2017-12-29 DIAGNOSIS — Z6835 Body mass index (BMI) 35.0-35.9, adult: Secondary | ICD-10-CM | POA: Insufficient documentation

## 2017-12-29 DIAGNOSIS — D128 Benign neoplasm of rectum: Secondary | ICD-10-CM | POA: Diagnosis not present

## 2017-12-29 DIAGNOSIS — Z86010 Personal history of colonic polyps: Secondary | ICD-10-CM | POA: Insufficient documentation

## 2017-12-29 DIAGNOSIS — Z7982 Long term (current) use of aspirin: Secondary | ICD-10-CM | POA: Diagnosis not present

## 2017-12-29 DIAGNOSIS — Z79891 Long term (current) use of opiate analgesic: Secondary | ICD-10-CM | POA: Insufficient documentation

## 2017-12-29 DIAGNOSIS — K640 First degree hemorrhoids: Secondary | ICD-10-CM | POA: Insufficient documentation

## 2017-12-29 DIAGNOSIS — Z8371 Family history of colonic polyps: Secondary | ICD-10-CM | POA: Diagnosis not present

## 2017-12-29 DIAGNOSIS — F329 Major depressive disorder, single episode, unspecified: Secondary | ICD-10-CM | POA: Diagnosis not present

## 2017-12-29 DIAGNOSIS — Z7951 Long term (current) use of inhaled steroids: Secondary | ICD-10-CM | POA: Diagnosis not present

## 2017-12-29 DIAGNOSIS — K635 Polyp of colon: Secondary | ICD-10-CM | POA: Insufficient documentation

## 2017-12-29 DIAGNOSIS — Z79899 Other long term (current) drug therapy: Secondary | ICD-10-CM | POA: Diagnosis not present

## 2017-12-29 DIAGNOSIS — D120 Benign neoplasm of cecum: Secondary | ICD-10-CM | POA: Diagnosis not present

## 2017-12-29 DIAGNOSIS — K5790 Diverticulosis of intestine, part unspecified, without perforation or abscess without bleeding: Secondary | ICD-10-CM | POA: Diagnosis not present

## 2017-12-29 DIAGNOSIS — D123 Benign neoplasm of transverse colon: Secondary | ICD-10-CM | POA: Diagnosis not present

## 2017-12-29 DIAGNOSIS — K648 Other hemorrhoids: Secondary | ICD-10-CM | POA: Diagnosis not present

## 2017-12-29 HISTORY — DX: Unspecified fracture of T7-t8 vertebra, initial encounter for closed fracture: S22.069A

## 2017-12-29 HISTORY — DX: Benign neoplasm of colon, unspecified: D12.6

## 2017-12-29 HISTORY — DX: Major depressive disorder, single episode, unspecified: F32.9

## 2017-12-29 HISTORY — DX: Gastro-esophageal reflux disease without esophagitis: K21.9

## 2017-12-29 HISTORY — PX: COLONOSCOPY WITH PROPOFOL: SHX5780

## 2017-12-29 HISTORY — DX: Depression, unspecified: F32.A

## 2017-12-29 SURGERY — COLONOSCOPY WITH PROPOFOL
Anesthesia: General

## 2017-12-29 MED ORDER — PROPOFOL 10 MG/ML IV BOLUS
INTRAVENOUS | Status: DC | PRN
  Administered 2017-12-29: 20 mg via INTRAVENOUS
  Administered 2017-12-29: 30 mg via INTRAVENOUS
  Administered 2017-12-29: 20 mg via INTRAVENOUS

## 2017-12-29 MED ORDER — FENTANYL CITRATE (PF) 100 MCG/2ML IJ SOLN
INTRAMUSCULAR | Status: AC
  Filled 2017-12-29: qty 2

## 2017-12-29 MED ORDER — LIDOCAINE HCL (PF) 2 % IJ SOLN
INTRAMUSCULAR | Status: AC
  Filled 2017-12-29: qty 10

## 2017-12-29 MED ORDER — PROPOFOL 500 MG/50ML IV EMUL
INTRAVENOUS | Status: AC
  Filled 2017-12-29: qty 50

## 2017-12-29 MED ORDER — ESMOLOL HCL 100 MG/10ML IV SOLN
INTRAVENOUS | Status: DC | PRN
Start: 2017-12-29 — End: 2017-12-29
  Administered 2017-12-29: 10 mg via INTRAVENOUS
  Administered 2017-12-29: 20 mg via INTRAVENOUS

## 2017-12-29 MED ORDER — PROPOFOL 500 MG/50ML IV EMUL
INTRAVENOUS | Status: DC | PRN
  Administered 2017-12-29: 50 ug/kg/min via INTRAVENOUS

## 2017-12-29 MED ORDER — FENTANYL CITRATE (PF) 100 MCG/2ML IJ SOLN
INTRAMUSCULAR | Status: DC | PRN
  Administered 2017-12-29 (×4): 25 ug via INTRAVENOUS

## 2017-12-29 MED ORDER — MIDAZOLAM HCL 2 MG/2ML IJ SOLN
INTRAMUSCULAR | Status: AC
  Filled 2017-12-29: qty 2

## 2017-12-29 MED ORDER — MIDAZOLAM HCL 5 MG/5ML IJ SOLN
INTRAMUSCULAR | Status: DC | PRN
  Administered 2017-12-29: 2 mg via INTRAVENOUS

## 2017-12-29 MED ORDER — SODIUM CHLORIDE 0.9 % IV SOLN
INTRAVENOUS | Status: DC
  Administered 2017-12-29: 14:00:00 via INTRAVENOUS

## 2017-12-29 MED ORDER — ESMOLOL HCL 100 MG/10ML IV SOLN
INTRAVENOUS | Status: AC
  Filled 2017-12-29: qty 10

## 2017-12-29 MED ORDER — LIDOCAINE HCL (PF) 2 % IJ SOLN
INTRAMUSCULAR | Status: DC | PRN
  Administered 2017-12-29: 100 mg

## 2017-12-29 NOTE — Anesthesia Postprocedure Evaluation (Signed)
Anesthesia Post Note  Patient: Eddie Chapman  Procedure(s) Performed: COLONOSCOPY WITH PROPOFOL (N/A )  Patient location during evaluation: PACU Anesthesia Type: General Level of consciousness: awake and alert and oriented Pain management: pain level controlled Vital Signs Assessment: post-procedure vital signs reviewed and stable Respiratory status: spontaneous breathing Cardiovascular status: blood pressure returned to baseline Anesthetic complications: no     Last Vitals:  Vitals:   12/29/17 1334 12/29/17 1450  BP: 123/83   Pulse: (!) 103   Resp: (!) 22   Temp: 37 C (!) 36.3 C  SpO2: 97%     Last Pain:  Vitals:   12/29/17 1450  TempSrc: Tympanic  PainSc:                  Jakub Debold

## 2017-12-29 NOTE — Anesthesia Preprocedure Evaluation (Addendum)
Anesthesia Evaluation  Patient identified by MRN, date of birth, ID band Patient awake    Reviewed: Allergy & Precautions, NPO status , Patient's Chart, lab work & pertinent test results  Airway Mallampati: III  TM Distance: >3 FB    Comment: Large neck Dental   Pulmonary neg pulmonary ROS,    Pulmonary exam normal        Cardiovascular hypertension, + Peripheral Vascular Disease  Normal cardiovascular exam     Neuro/Psych PSYCHIATRIC DISORDERS Depression Hx of cerebral aneurism    GI/Hepatic Neg liver ROS, GERD  ,Colon polyp   Endo/Other  negative endocrine ROSMorbid obesity  Renal/GU negative Renal ROS     Musculoskeletal   Abdominal Normal abdominal exam  (+)   Peds  Hematology negative hematology ROS (+)   Anesthesia Other Findings Past Medical History: No date: Adenomatous colon polyp No date: Depression No date: Ear drum perforation     Comment:  left x2  No date: GERD (gastroesophageal reflux disease) No date: Hyperlipidemia No date: Hypertension No date: T8 vertebral fracture (HCC)   Hx of cerebral aneurism  Reproductive/Obstetrics                            Anesthesia Physical Anesthesia Plan  ASA: III  Anesthesia Plan: General   Post-op Pain Management:    Induction: Intravenous  PONV Risk Score and Plan:   Airway Management Planned: Nasal Cannula  Additional Equipment:   Intra-op Plan:   Post-operative Plan:   Informed Consent: I have reviewed the patients History and Physical, chart, labs and discussed the procedure including the risks, benefits and alternatives for the proposed anesthesia with the patient or authorized representative who has indicated his/her understanding and acceptance.   Dental advisory given  Plan Discussed with: CRNA and Surgeon  Anesthesia Plan Comments:         Anesthesia Quick Evaluation

## 2017-12-29 NOTE — Anesthesia Post-op Follow-up Note (Signed)
Anesthesia QCDR form completed.        

## 2017-12-29 NOTE — Transfer of Care (Signed)
Immediate Anesthesia Transfer of Care Note  Patient: Eddie Chapman  Procedure(s) Performed: COLONOSCOPY WITH PROPOFOL (N/A )  Patient Location: PACU  Anesthesia Type:General  Level of Consciousness: sedated  Airway & Oxygen Therapy: Patient Spontanous Breathing and Patient connected to nasal cannula oxygen  Post-op Assessment: Report given to RN and Post -op Vital signs reviewed and stable  Post vital signs: Reviewed and stable  Last Vitals:  Vitals Value Taken Time  BP 116/87 12/29/2017  2:50 PM  Temp    Pulse 100 12/29/2017  2:51 PM  Resp 22 12/29/2017  2:51 PM  SpO2 94 % 12/29/2017  2:51 PM  Vitals shown include unvalidated device data.  Last Pain:  Vitals:   12/29/17 1334  TempSrc: Tympanic  PainSc: 5          Complications: No apparent anesthesia complications and Patient re-intubated

## 2017-12-29 NOTE — Op Note (Signed)
[facility] Patient Name: Eddie Chapman Procedure Date: 12/29/2017 2:20 PM MRN: [id number] Account #: [id number] Date of Birth: 1961-07-22 Admit Type: Outpatient Age: 57 Room: [facility] ENDO ROOM 1 Gender: Male Note Status: Finalized Procedure:            Colonoscopy Indications:          High risk colon cancer surveillance: Personal history                        of colonic polyps Providers:            Manya Silvas, MD Referring MD:         Ramonita Lab, MD (Referring MD) Medicines:            Propofol per Anesthesia Complications:        No immediate complications. Procedure:            Pre-Anesthesia Assessment:                       - After reviewing the risks and benefits, the patient                        was deemed in satisfactory condition to undergo the                        procedure.                       After obtaining informed consent, the colonoscope was                        passed under direct vision. Throughout the procedure,                        the patient's blood pressure, pulse, and oxygen                        saturations were monitored continuously. The                        Colonoscope was introduced through the anus and                        advanced to the the cecum, identified by appendiceal                        orifice and ileocecal valve. The colonoscopy was                        performed without difficulty. The patient tolerated the                        procedure well. The quality of the bowel preparation                        was excellent. Findings:      A diminutive polyp was found in the cecum. The polyp was sessile. The       polyp was removed with a jumbo cold forceps. Resection and retrieval       were complete.      A diminutive polyp was found in  the transverse colon. The polyp was       sessile. The polyp was removed with a jumbo cold forceps. Resection and       retrieval were complete.  Two sessile polyps were found in the rectum. The polyps were diminutive       in size. These polyps were removed with a jumbo cold forceps. Resection       and retrieval were complete.      Internal hemorrhoids were found during endoscopy. The hemorrhoids were       small and Grade I (internal hemorrhoids that do not prolapse).      A few small-mouthed diverticula were found in the sigmoid colon.      The exam was otherwise without abnormality. Impression:           - One diminutive polyp in the cecum, removed with a                        jumbo cold forceps. Resected and retrieved.                       - One diminutive polyp in the transverse colon, removed                        with a jumbo cold forceps. Resected and retrieved.                       - Two diminutive polyps in the rectum, removed with a                        jumbo cold forceps. Resected and retrieved.                       - Internal hemorrhoids. Recommendation:       - Await pathology results. Manya Silvas, MD 12/29/2017 2:49:45 PM This report has been signed electronically. Number of Addenda: 0 Note Initiated On: 12/29/2017 2:20 PM Scope Withdrawal Time: 0 hours 13 minutes 16 seconds  Total Procedure Duration: 0 hours 19 minutes 36 seconds       [facility]

## 2017-12-29 NOTE — H&P (Signed)
Primary Care Physician:  Adin Hector, MD Primary Gastroenterologist:  Dr. Vira Agar  Pre-Procedure History & Physical: HPI:  Eddie Chapman is a 57 y.o. male is here for an colonoscopy for [facility] colon polyps and FH colon cancer.   Past Medical History:  Diagnosis Date  . Adenomatous colon polyp   . Depression   . Ear drum perforation    left x2   . GERD (gastroesophageal reflux disease)   . Hyperlipidemia   . Hypertension   . T8 vertebral fracture [facility])     Past Surgical History:  Procedure Laterality Date  . CARPAL TUNNEL RELEASE     left hand  . COLONOSCOPY  09/18/2009, 09/15/2014  . EYE SURGERY    . FRACTURE SURGERY    . HERNIA REPAIR    . LIPOMA EXCISION    . SPINE SURGERY      Prior to Admission medications   Medication Sig Start Date End Date Taking? Authorizing Provider  amLODipine (NORVASC) 10 MG tablet Take by mouth. 01/29/16  Yes [provider]  aspirin EC 81 MG tablet Take 162 mg by mouth.   Yes [provider]  Calcium Citrate-Vitamin D3 1000-400 LIQD Take by mouth.   Yes [provider]  diazepam (VALIUM) 10 MG tablet  08/01/16  Yes [provider]  DULoxetine (CYMBALTA) 30 MG capsule Take 30 mg by mouth.  08/17/15  Yes [provider]  fluticasone (FLONASE) 50 MCG/ACT nasal spray Place into the nose.   Yes [provider]  HYDROcodone-acetaminophen (NORCO/VICODIN) 5-325 MG tablet  07/31/16  Yes [provider]  lovastatin (MEVACOR) 40 MG tablet 40 mg. 2 tablets at bedtime 08/01/16  Yes [provider]  meclizine (ANTIVERT) 25 MG tablet TAKE ONE TABLET BY MOUTH THREE TIMES DAILY AS NEEDED FOR DIZZINESS FOR  UP  TO  10  DAYS 07/01/16  Yes [provider]  Omega-3 Fatty Acids (FISH OIL PO) Take by mouth.   Yes [provider]  omeprazole (PRILOSEC) 20 MG capsule Take by mouth.   Yes [provider]  propranolol (INDERAL) 40 MG tablet Take 20 mg by mouth 2 (two) times  daily.  08/17/15  Yes [provider]  traZODone (DESYREL) 50 MG tablet Take by mouth. 11/29/15 12/29/17 Yes [provider]  vitamin E 1000 UNIT capsule Take by mouth.   Yes [provider]  carisoprodol (SOMA) 350 MG tablet TAKE ONE TABLET BY MOUTH TWICE DAILY AS NEEDED 10/31/15   [provider]  cloNIDine (CATAPRES) 0.1 MG tablet Take 0.1 mg by mouth. 08/17/15   [provider]  diphenhydrAMINE (BENADRYL) 25 mg capsule Take by mouth.    [provider]  losartan (COZAAR) 50 MG tablet Take 50 mg by mouth.  08/17/15   [provider]    Allergies as of 11/03/2017 - Review Complete 08/19/2016  Allergen Reaction Noted  . Divalproex sodium Other (See Comments) 05/13/2014  . Valproic acid  07/17/2015    Family History  Problem Relation Age of Onset  . Cancer Mother   . Varicose Veins Mother     Social History   Socioeconomic History  . Marital status: Single    Spouse name: Not on file  . Number of children: Not on file  . Years of education: Not on file  . Highest education level: Not on file  Occupational History  . Not on file  Social Needs  . Financial resource strain: Not on file  .  Food insecurity:    Worry: Not on file    Inability: Not on file  . Transportation needs:    Medical: Not on file    Non-medical: Not on file  Tobacco Use  . Smoking status: Never Smoker  . Smokeless tobacco: Never Used  Substance and Sexual Activity  . Alcohol use: Never    Frequency: Never  . Drug use: Never  . Sexual activity: Not on file  Lifestyle  . Physical activity:    Days per week: Not on file    Minutes per session: Not on file  . Stress: Not on file  Relationships  . Social connections:    Talks on phone: Not on file    Gets together: Not on file    Attends religious service: Not on file    Active member of club or organization: Not on file    Attends meetings of clubs or organizations: Not on file     Relationship status: Not on file  . Intimate partner violence:    Fear of current or ex partner: Not on file    Emotionally abused: Not on file    Physically abused: Not on file    Forced sexual activity: Not on file  Other Topics Concern  . Not on file  Social History Narrative  . Not on file    Review of Systems: See HPI, otherwise negative ROS  Physical Exam: BP 123/83   Pulse (!) 103   Temp 98.6 F (37 C) (Tympanic)   Resp (!) 22   Ht 6' (1.829 m)   Wt 120.2 kg (265 lb)   SpO2 97%   BMI 35.94 kg/m  General:   Alert,  pleasant and cooperative in NAD Head:  Normocephalic and atraumatic. Neck:  Supple; no masses or thyromegaly. Lungs:  Clear throughout to auscultation.    Heart:  Regular rate and rhythm. Abdomen:  Soft, nontender and nondistended. Normal bowel sounds, without guarding, and without rebound.   Neurologic:  Alert and  oriented x4;  grossly normal neurologically.  Impression/Plan: Eddie Chapman is here for an colonoscopy to be performed for [facility] colon polyps and family history of colon cancer.  Risks, benefits, limitations, and alternatives regarding  colonoscopy have been reviewed with the patient.  Questions have been answered.  All parties agreeable.   Gaylyn Cheers, MD  12/29/2017, 2:19 PM

## 2017-12-29 NOTE — Addendum Note (Signed)
Addendum  created 12/29/17 1500 by Alvin Critchley, MD   Intraprocedure Staff edited

## 2017-12-31 LAB — SURGICAL PATHOLOGY

## 2018-02-19 DIAGNOSIS — I739 Peripheral vascular disease, unspecified: Secondary | ICD-10-CM | POA: Diagnosis not present

## 2018-02-19 DIAGNOSIS — E559 Vitamin D deficiency, unspecified: Secondary | ICD-10-CM | POA: Diagnosis not present

## 2018-02-19 DIAGNOSIS — E349 Endocrine disorder, unspecified: Secondary | ICD-10-CM | POA: Diagnosis not present

## 2018-02-19 DIAGNOSIS — E785 Hyperlipidemia, unspecified: Secondary | ICD-10-CM | POA: Diagnosis not present

## 2018-02-19 DIAGNOSIS — I10 Essential (primary) hypertension: Secondary | ICD-10-CM | POA: Diagnosis not present

## 2018-02-26 DIAGNOSIS — E559 Vitamin D deficiency, unspecified: Secondary | ICD-10-CM | POA: Diagnosis not present

## 2018-02-26 DIAGNOSIS — G894 Chronic pain syndrome: Secondary | ICD-10-CM | POA: Diagnosis not present

## 2018-02-26 DIAGNOSIS — Z125 Encounter for screening for malignant neoplasm of prostate: Secondary | ICD-10-CM | POA: Diagnosis not present

## 2018-02-26 DIAGNOSIS — F3342 Major depressive disorder, recurrent, in full remission: Secondary | ICD-10-CM | POA: Diagnosis not present

## 2018-02-26 DIAGNOSIS — I10 Essential (primary) hypertension: Secondary | ICD-10-CM | POA: Diagnosis not present

## 2018-02-26 DIAGNOSIS — I739 Peripheral vascular disease, unspecified: Secondary | ICD-10-CM | POA: Diagnosis not present

## 2018-02-26 DIAGNOSIS — M5412 Radiculopathy, cervical region: Secondary | ICD-10-CM | POA: Diagnosis not present

## 2018-02-26 DIAGNOSIS — I671 Cerebral aneurysm, nonruptured: Secondary | ICD-10-CM | POA: Diagnosis not present

## 2018-02-26 DIAGNOSIS — D72821 Monocytosis (symptomatic): Secondary | ICD-10-CM | POA: Diagnosis not present

## 2018-02-26 DIAGNOSIS — S22060S Wedge compression fracture of T7-T8 vertebra, sequela: Secondary | ICD-10-CM | POA: Diagnosis not present

## 2018-02-26 DIAGNOSIS — E349 Endocrine disorder, unspecified: Secondary | ICD-10-CM | POA: Diagnosis not present

## 2018-02-26 DIAGNOSIS — E785 Hyperlipidemia, unspecified: Secondary | ICD-10-CM | POA: Diagnosis not present

## 2018-08-24 DIAGNOSIS — E559 Vitamin D deficiency, unspecified: Secondary | ICD-10-CM | POA: Diagnosis not present

## 2018-08-24 DIAGNOSIS — I739 Peripheral vascular disease, unspecified: Secondary | ICD-10-CM | POA: Diagnosis not present

## 2018-08-24 DIAGNOSIS — E785 Hyperlipidemia, unspecified: Secondary | ICD-10-CM | POA: Diagnosis not present

## 2018-08-24 DIAGNOSIS — M5412 Radiculopathy, cervical region: Secondary | ICD-10-CM | POA: Diagnosis not present

## 2018-08-24 DIAGNOSIS — E349 Endocrine disorder, unspecified: Secondary | ICD-10-CM | POA: Diagnosis not present

## 2018-08-24 DIAGNOSIS — I10 Essential (primary) hypertension: Secondary | ICD-10-CM | POA: Diagnosis not present

## 2018-08-31 DIAGNOSIS — R2689 Other abnormalities of gait and mobility: Secondary | ICD-10-CM | POA: Diagnosis not present

## 2018-08-31 DIAGNOSIS — I671 Cerebral aneurysm, nonruptured: Secondary | ICD-10-CM | POA: Diagnosis not present

## 2018-08-31 DIAGNOSIS — E785 Hyperlipidemia, unspecified: Secondary | ICD-10-CM | POA: Diagnosis not present

## 2018-08-31 DIAGNOSIS — I10 Essential (primary) hypertension: Secondary | ICD-10-CM | POA: Diagnosis not present

## 2018-08-31 DIAGNOSIS — E559 Vitamin D deficiency, unspecified: Secondary | ICD-10-CM | POA: Diagnosis not present

## 2018-08-31 DIAGNOSIS — Z0000 Encounter for general adult medical examination without abnormal findings: Secondary | ICD-10-CM | POA: Diagnosis not present

## 2018-08-31 DIAGNOSIS — G894 Chronic pain syndrome: Secondary | ICD-10-CM | POA: Diagnosis not present

## 2018-08-31 DIAGNOSIS — K219 Gastro-esophageal reflux disease without esophagitis: Secondary | ICD-10-CM | POA: Diagnosis not present

## 2018-08-31 DIAGNOSIS — Z23 Encounter for immunization: Secondary | ICD-10-CM | POA: Diagnosis not present

## 2018-08-31 DIAGNOSIS — G4733 Obstructive sleep apnea (adult) (pediatric): Secondary | ICD-10-CM | POA: Diagnosis not present

## 2018-08-31 DIAGNOSIS — M5030 Other cervical disc degeneration, unspecified cervical region: Secondary | ICD-10-CM | POA: Diagnosis not present

## 2018-08-31 DIAGNOSIS — E349 Endocrine disorder, unspecified: Secondary | ICD-10-CM | POA: Diagnosis not present

## 2018-08-31 DIAGNOSIS — R0609 Other forms of dyspnea: Secondary | ICD-10-CM | POA: Diagnosis not present

## 2018-08-31 DIAGNOSIS — I739 Peripheral vascular disease, unspecified: Secondary | ICD-10-CM | POA: Diagnosis not present

## 2018-09-11 DIAGNOSIS — R809 Proteinuria, unspecified: Secondary | ICD-10-CM | POA: Diagnosis not present

## 2018-09-11 DIAGNOSIS — R0609 Other forms of dyspnea: Secondary | ICD-10-CM | POA: Diagnosis not present

## 2018-09-11 DIAGNOSIS — R600 Localized edema: Secondary | ICD-10-CM | POA: Diagnosis not present

## 2018-09-18 DIAGNOSIS — E785 Hyperlipidemia, unspecified: Secondary | ICD-10-CM | POA: Diagnosis not present

## 2018-09-18 DIAGNOSIS — M5030 Other cervical disc degeneration, unspecified cervical region: Secondary | ICD-10-CM | POA: Diagnosis not present

## 2018-09-18 DIAGNOSIS — G894 Chronic pain syndrome: Secondary | ICD-10-CM | POA: Diagnosis not present

## 2018-09-18 DIAGNOSIS — I671 Cerebral aneurysm, nonruptured: Secondary | ICD-10-CM | POA: Diagnosis not present

## 2018-09-18 DIAGNOSIS — I10 Essential (primary) hypertension: Secondary | ICD-10-CM | POA: Diagnosis not present

## 2018-09-18 DIAGNOSIS — K219 Gastro-esophageal reflux disease without esophagitis: Secondary | ICD-10-CM | POA: Diagnosis not present

## 2018-09-18 DIAGNOSIS — E349 Endocrine disorder, unspecified: Secondary | ICD-10-CM | POA: Diagnosis not present

## 2018-09-18 DIAGNOSIS — I5022 Chronic systolic (congestive) heart failure: Secondary | ICD-10-CM | POA: Diagnosis not present

## 2018-09-18 DIAGNOSIS — E559 Vitamin D deficiency, unspecified: Secondary | ICD-10-CM | POA: Diagnosis not present

## 2018-09-18 DIAGNOSIS — I739 Peripheral vascular disease, unspecified: Secondary | ICD-10-CM | POA: Diagnosis not present

## 2018-09-18 DIAGNOSIS — F3342 Major depressive disorder, recurrent, in full remission: Secondary | ICD-10-CM | POA: Diagnosis not present

## 2018-09-24 DIAGNOSIS — R079 Chest pain, unspecified: Secondary | ICD-10-CM | POA: Diagnosis not present

## 2018-09-24 DIAGNOSIS — I10 Essential (primary) hypertension: Secondary | ICD-10-CM | POA: Diagnosis not present

## 2018-10-07 ENCOUNTER — Other Ambulatory Visit: Payer: Self-pay

## 2018-10-07 ENCOUNTER — Ambulatory Visit: Payer: PPO | Attending: Internal Medicine | Admitting: Physical Therapy

## 2018-10-07 ENCOUNTER — Encounter: Payer: Self-pay | Admitting: Physical Therapy

## 2018-10-07 DIAGNOSIS — R2681 Unsteadiness on feet: Secondary | ICD-10-CM | POA: Diagnosis not present

## 2018-10-07 NOTE — Therapy (Signed)
[place] MAIN [facility] SERVICES [address] [place], [place], [address] Phone: [phone]   Fax:  [phone]  Physical Therapy Evaluation  Patient Details  Name: Eddie Chapman MRN: [id number] Date of Birth: [birth year] Referring Provider (PT): Dr. Ramonita Lab III   Encounter Date: 10/07/2018  PT End of Session - 10/07/18 1552    Visit Number  1    Number of Visits  9    Date for PT Re-Evaluation  11/04/18    Authorization Type  1/10, start of care 10/07/18    PT Start Time  1502    PT Stop Time  1540    PT Time Calculation (min)  38 min    Equipment Utilized During Treatment  Gait belt    Activity Tolerance  Treatment limited secondary to medical complications (Comment);Other (comment)   drop in SPo2, increased HR and high BP; awaiting results of stress test   Behavior During Therapy  [facility] for tasks assessed/performed       Past Medical History:  Diagnosis Date  . Adenomatous colon polyp   . Depression   . Ear drum perforation    left x2   . GERD (gastroesophageal reflux disease)   . Hyperlipidemia   . Hypertension   . T8 vertebral fracture [facility])     Past Surgical History:  Procedure Laterality Date  . CARPAL TUNNEL RELEASE     left hand  . COLONOSCOPY  09/18/2009, 09/15/2014  . COLONOSCOPY WITH PROPOFOL N/A 12/29/2017   Procedure: COLONOSCOPY WITH PROPOFOL;  Surgeon: Manya Silvas, MD;  Location: [facility] ENDOSCOPY;  Service: Endoscopy;  Laterality: N/A;  . EYE SURGERY    . FRACTURE SURGERY    . HERNIA REPAIR    . LIPOMA EXCISION    . SPINE SURGERY      There were no vitals filed for this visit.   Subjective Assessment - 10/07/18 1515    Subjective  I am just having trouble with my balance and my legs are giving out if I stand too long;     Pertinent History  58 yo Male  is s/p brain injury on 07/16/15; after brain injury patient had PT for approximately 1 month and was discharged with good mobility; He reports in last 6  months he has started having numbness in his legs with prolonged static standing and imbalance. He reports falling approximately 1 year ago when he was statically standing and his legs just collapsed. He presents to therapy with SPC. He reports that he has been using it off and on over last year; In addition, patient does report increased shortness of breath; diagnosed with cardiomyopathy. Recent Echo shows EF of 30%; Went to see cardiologist and they are recommending a stress chest, he has that scheduled end of this week; Currently denies any shortness of breath, chest pain or numbness in UE;     Limitations  Standing;Walking    How long can you sit comfortably?  NA    How long can you stand comfortably?  5-10 min    How long can you walk comfortably?  increased fatigue walking 1 mile- not walking as much as he used to.     Diagnostic tests  no recent imaging;     Patient Stated Goals  lose weight, increase walking ability, improve balance, increase mobility;     Currently in Pain?  Yes    Pain Score  7     Pain Location  Back  Pain Orientation  Lower;Mid    Pain Descriptors / Indicators  Aching;Dull    Pain Type  Chronic pain    Pain Onset  More than a month ago    Pain Frequency  Intermittent    Aggravating Factors   putting weight on back, colder weather,     Pain Relieving Factors  laying flat on back, supine position    Effect of Pain on Daily Activities  decreased standing/walking tolerance;     Multiple Pain Sites  No         [facility] PT Assessment - 10/07/18 0001      Assessment   Medical Diagnosis  s/p Brain injury; imbalance    Referring Provider (PT)  Dr. Ramonita Lab III    Onset Date/Surgical Date  --   last 6 months   Hand Dominance  Right    Prior Therapy  had Aquatic Therapy in 2002 for back injury with good results; Had Inpatient rehab following injury; No outpatient PT for this condition;       Precautions   Precautions  Fall      Restrictions   Weight Bearing  Restrictions  No      Balance Screen   Has the patient fallen in the past 6 months  No    Has the patient had a decrease in activity level because of a fear of falling?   Yes    Is the patient reluctant to leave their home because of a fear of falling?   No      Home Environment   Additional Comments  Lives in single story home with 1-4 steps to enter (left rail); Has RW, cane; lives with wife; walk in shower, with builtin shower seat; elevated commode;       Prior Function   Level of Independence  Independent with household mobility with device    Vocation  Retired    Leisure  walk on walking trails, travel, explore       Cognition   Overall Cognitive Status  Within Functional Limits for tasks assessed      Sensation   Light Touch  Appears Intact    Proprioception  Appears Intact      Coordination   Gross Motor Movements are Fluid and Coordinated  Yes    Finger Nose Finger Test  accurated bilaterally;      Posture/Postural Control   Posture Comments  demonstrates mild slumped posture with thoracic kyphosis, reduced lumbar lordosis; able to self correct with cues      AROM   Overall AROM Comments  BLE AROM is [facility]      Strength   Overall Strength Comments  BLE gross strength is [facility]      Transfers   Comments  requires 2 HHA to push up from chair;       Ambulation/Gait   Gait Comments  pt ambulates without SPC with good gait speed and good foot clearance; however following 50 feet of ambulation very short of breath, vitals monitored and after 75 feet, SPo2 dropped to 89%, HR >100; patient sat down and within 30 sec, HR 92, SPO2 98%; BP at rest 132/98      Standardized Balance Assessment   10 Meter Walk  1.17 m/s, community ambulator                Objective measurements completed on examination: See above findings.  Stopped therapy secondary to risk for cardiac event and need for additional testing before  being cleared for PT; Patient verbalized understanding;              PT Education - 10/07/18 1552    Education Details  recommendations/concern of cardiac deconditioning;     Person(s) Educated  Patient    Methods  Explanation    Comprehension  Verbalized understanding       PT Short Term Goals - 10/07/18 1651      PT SHORT TERM GOAL #1   Title  Patient will be adherent to HEP at least 3x a week to improve functional strength and balance for better safety at home.    Time  2    Period  Weeks    Status  New    Target Date  10/21/18        PT Long Term Goals - 10/07/18 1653      PT LONG TERM GOAL #1   Title   Patient will demonstrate an improved Berg Balance Score of >45/56 as to demonstrate improved balance with ADLs such as sitting/standing and transfer balance and reduced fall risk.     Time  4    Period  Weeks    Status  New    Target Date  11/04/18      PT LONG TERM GOAL #2   Title  Patient will increase lower extremity functional scale to >60/80 to demonstrate improved functional mobility and increased tolerance with ADLs.     Time  4    Period  Weeks    Status  New    Target Date  11/04/18      PT LONG TERM GOAL #3   Title  Patient will tolerate 5 seconds of single leg stance without loss of balance to improve ability to get in and out of shower safely.    Time  4    Period  Weeks    Status  New    Target Date  11/04/18             Plan - 10/07/18 1555    Clinical Impression Statement  58 yo Male reports increased imbalance over last 6 months. He reports that when he is bending over or standing for long periods of time he feels like he will fall. In addition he has been using a SPC intermittently for over last year. Patient reports a decline in walking ability with overall increased fatigue. He exhibits good strength in BLE. Patient does become short of breath with minimal ambulation. Recent cardiology note reported that patient is at risk for a cardiovascular event and has been referred for stress  testing. Patient reports the cardiac stress test is schedule for later in the week. Vitals monitored with ambulation, after walking <100 feet, SPO2 dropped to 88%, HR increased to >100 and BP was 132/95. PT evaluation was stopped at this time. Recommend patient follow up with cardiologist. Upon clearance from cardiologist, he would benefit from additional skilled PT intervention to further assess balance and address deficits.     History and Personal Factors relevant to plan of care:  high risk for cardiovascular event, lives with wife in single story home with stairs to enter, no recent falls but is considered a high fall risk; low SPO2 with short distance ambulation limiting activity tolerance;     Clinical Presentation  Unstable    Clinical Presentation due to:  concerned about unstable vital signs with minimal activity and high risk for cardiovascular event; in addition is a high risk for falls  Clinical Decision Making  High    Rehab Potential  Fair    Clinical Impairments Affecting Rehab Potential  good PLOF, motivated; does have multiple medical co-morbidities which could affect care;     PT Frequency  2x / week    PT Duration  4 weeks    PT Treatment/Interventions  Aquatic Therapy;Cryotherapy;Moist Heat;Gait training;Functional mobility training;Stair training;Therapeutic activities;Therapeutic exercise;Balance training;Neuromuscular re-education;Patient/family education;Energy conservation    PT Next Visit Plan  continue to assess balance, address HEP    PT Home Exercise Plan  will address next visit    Recommended Other Services  recommend patient follow up with cardiologist prior to returning to physical therapy    Consulted and Agree with Plan of Care  Patient       Patient will benefit from skilled therapeutic intervention in order to improve the following deficits and impairments:  Abnormal gait, Cardiopulmonary status limiting activity, Decreased activity tolerance, Decreased  balance, Decreased endurance, Difficulty walking  Visit Diagnosis: Unsteadiness on feet     Problem List Patient Active Problem List   Diagnosis Date Noted  . Occlusion and stenosis of vertebral artery 08/19/2016  . Aneurysm, cerebral, nonruptured 08/19/2016  . Essential hypertension 08/19/2016  . Hyperlipidemia 08/19/2016    Wanita Derenzo PT, DPT 10/07/2018, 4:57 PM  [place] MAIN [facility] SERVICES [address] [place], [place], [address] Phone: [phone]   Fax:  [phone]  Name: WILLEM KLINGENSMITH MRN: [id number] Date of Birth: Apr 13, 1961

## 2018-10-09 DIAGNOSIS — R079 Chest pain, unspecified: Secondary | ICD-10-CM | POA: Diagnosis not present

## 2018-10-12 DIAGNOSIS — R079 Chest pain, unspecified: Secondary | ICD-10-CM | POA: Diagnosis not present

## 2018-10-13 ENCOUNTER — Ambulatory Visit: Payer: PPO | Admitting: Physical Therapy

## 2018-10-14 ENCOUNTER — Ambulatory Visit: Payer: PPO | Admitting: Physical Therapy

## 2018-10-15 ENCOUNTER — Ambulatory Visit: Payer: PPO | Admitting: Physical Therapy

## 2018-10-16 DIAGNOSIS — G4734 Idiopathic sleep related nonobstructive alveolar hypoventilation: Secondary | ICD-10-CM | POA: Diagnosis not present

## 2018-10-16 DIAGNOSIS — E785 Hyperlipidemia, unspecified: Secondary | ICD-10-CM | POA: Diagnosis not present

## 2018-10-16 DIAGNOSIS — I10 Essential (primary) hypertension: Secondary | ICD-10-CM | POA: Diagnosis not present

## 2018-10-16 DIAGNOSIS — I5022 Chronic systolic (congestive) heart failure: Secondary | ICD-10-CM | POA: Diagnosis not present

## 2018-10-19 DIAGNOSIS — G4733 Obstructive sleep apnea (adult) (pediatric): Secondary | ICD-10-CM | POA: Diagnosis not present

## 2018-10-20 ENCOUNTER — Ambulatory Visit: Payer: PPO | Admitting: Physical Therapy

## 2018-10-21 ENCOUNTER — Ambulatory Visit: Payer: PPO | Admitting: Physical Therapy

## 2018-10-21 DIAGNOSIS — G4733 Obstructive sleep apnea (adult) (pediatric): Secondary | ICD-10-CM | POA: Diagnosis not present

## 2018-10-22 ENCOUNTER — Ambulatory Visit: Payer: PPO | Admitting: Physical Therapy

## 2018-10-27 ENCOUNTER — Ambulatory Visit: Payer: PPO

## 2018-10-28 ENCOUNTER — Ambulatory Visit: Payer: Self-pay | Admitting: Physical Therapy

## 2018-10-28 DIAGNOSIS — R42 Dizziness and giddiness: Secondary | ICD-10-CM | POA: Diagnosis not present

## 2018-10-28 DIAGNOSIS — H903 Sensorineural hearing loss, bilateral: Secondary | ICD-10-CM | POA: Diagnosis not present

## 2018-10-29 ENCOUNTER — Ambulatory Visit: Payer: PPO | Admitting: Physical Therapy

## 2018-11-03 ENCOUNTER — Ambulatory Visit: Payer: PPO | Admitting: Physical Therapy

## 2018-11-04 ENCOUNTER — Ambulatory Visit: Payer: Self-pay | Admitting: Physical Therapy

## 2018-11-05 ENCOUNTER — Ambulatory Visit: Payer: PPO

## 2018-11-11 ENCOUNTER — Ambulatory Visit: Payer: Self-pay | Admitting: Physical Therapy

## 2018-11-26 DIAGNOSIS — I671 Cerebral aneurysm, nonruptured: Secondary | ICD-10-CM | POA: Diagnosis not present

## 2018-11-27 ENCOUNTER — Other Ambulatory Visit: Payer: Self-pay | Admitting: Student

## 2018-11-27 DIAGNOSIS — I671 Cerebral aneurysm, nonruptured: Secondary | ICD-10-CM

## 2018-12-11 DIAGNOSIS — I739 Peripheral vascular disease, unspecified: Secondary | ICD-10-CM | POA: Diagnosis not present

## 2018-12-11 DIAGNOSIS — E785 Hyperlipidemia, unspecified: Secondary | ICD-10-CM | POA: Diagnosis not present

## 2018-12-11 DIAGNOSIS — I10 Essential (primary) hypertension: Secondary | ICD-10-CM | POA: Diagnosis not present

## 2018-12-11 DIAGNOSIS — M5030 Other cervical disc degeneration, unspecified cervical region: Secondary | ICD-10-CM | POA: Diagnosis not present

## 2018-12-14 ENCOUNTER — Other Ambulatory Visit: Payer: Self-pay

## 2018-12-14 ENCOUNTER — Ambulatory Visit
Admission: RE | Admit: 2018-12-14 | Discharge: 2018-12-14 | Disposition: A | Discharge status: Home / Self Care | Payer: PPO | Source: Ambulatory Visit | Attending: Student | Admitting: Student

## 2018-12-14 DIAGNOSIS — I671 Cerebral aneurysm, nonruptured: Secondary | ICD-10-CM | POA: Diagnosis not present

## 2018-12-14 MED ORDER — IOPAMIDOL (ISOVUE-370) INJECTION 76%
75.0000 mL | Freq: Once | INTRAVENOUS | Status: AC | PRN
  Administered 2018-12-14: 75 mL via INTRAVENOUS

## 2018-12-18 DIAGNOSIS — I671 Cerebral aneurysm, nonruptured: Secondary | ICD-10-CM | POA: Diagnosis not present

## 2018-12-18 DIAGNOSIS — E559 Vitamin D deficiency, unspecified: Secondary | ICD-10-CM | POA: Diagnosis not present

## 2018-12-18 DIAGNOSIS — K219 Gastro-esophageal reflux disease without esophagitis: Secondary | ICD-10-CM | POA: Diagnosis not present

## 2018-12-18 DIAGNOSIS — G4733 Obstructive sleep apnea (adult) (pediatric): Secondary | ICD-10-CM | POA: Diagnosis not present

## 2018-12-18 DIAGNOSIS — I10 Essential (primary) hypertension: Secondary | ICD-10-CM | POA: Diagnosis not present

## 2018-12-18 DIAGNOSIS — G894 Chronic pain syndrome: Secondary | ICD-10-CM | POA: Diagnosis not present

## 2018-12-18 DIAGNOSIS — I5022 Chronic systolic (congestive) heart failure: Secondary | ICD-10-CM | POA: Diagnosis not present

## 2018-12-18 DIAGNOSIS — E349 Endocrine disorder, unspecified: Secondary | ICD-10-CM | POA: Diagnosis not present

## 2018-12-18 DIAGNOSIS — M5412 Radiculopathy, cervical region: Secondary | ICD-10-CM | POA: Diagnosis not present

## 2018-12-18 DIAGNOSIS — I739 Peripheral vascular disease, unspecified: Secondary | ICD-10-CM | POA: Diagnosis not present

## 2018-12-18 DIAGNOSIS — Z23 Encounter for immunization: Secondary | ICD-10-CM | POA: Diagnosis not present

## 2018-12-18 DIAGNOSIS — E785 Hyperlipidemia, unspecified: Secondary | ICD-10-CM | POA: Diagnosis not present

## 2019-01-19 DIAGNOSIS — I5022 Chronic systolic (congestive) heart failure: Secondary | ICD-10-CM | POA: Diagnosis not present

## 2019-01-19 DIAGNOSIS — G4733 Obstructive sleep apnea (adult) (pediatric): Secondary | ICD-10-CM | POA: Diagnosis not present

## 2019-01-19 DIAGNOSIS — E785 Hyperlipidemia, unspecified: Secondary | ICD-10-CM | POA: Diagnosis not present

## 2019-01-19 DIAGNOSIS — I10 Essential (primary) hypertension: Secondary | ICD-10-CM | POA: Diagnosis not present

## 2019-01-28 DIAGNOSIS — I5022 Chronic systolic (congestive) heart failure: Secondary | ICD-10-CM | POA: Diagnosis not present

## 2019-04-13 DIAGNOSIS — R739 Hyperglycemia, unspecified: Secondary | ICD-10-CM | POA: Diagnosis not present

## 2019-04-13 DIAGNOSIS — I739 Peripheral vascular disease, unspecified: Secondary | ICD-10-CM | POA: Diagnosis not present

## 2019-04-13 DIAGNOSIS — I10 Essential (primary) hypertension: Secondary | ICD-10-CM | POA: Diagnosis not present

## 2019-04-13 DIAGNOSIS — E785 Hyperlipidemia, unspecified: Secondary | ICD-10-CM | POA: Diagnosis not present

## 2019-04-20 DIAGNOSIS — I671 Cerebral aneurysm, nonruptured: Secondary | ICD-10-CM | POA: Diagnosis not present

## 2019-04-20 DIAGNOSIS — S22060S Wedge compression fracture of T7-T8 vertebra, sequela: Secondary | ICD-10-CM | POA: Diagnosis not present

## 2019-04-20 DIAGNOSIS — G894 Chronic pain syndrome: Secondary | ICD-10-CM | POA: Diagnosis not present

## 2019-04-20 DIAGNOSIS — M5412 Radiculopathy, cervical region: Secondary | ICD-10-CM | POA: Diagnosis not present

## 2019-04-20 DIAGNOSIS — I5022 Chronic systolic (congestive) heart failure: Secondary | ICD-10-CM | POA: Diagnosis not present

## 2019-04-20 DIAGNOSIS — G4733 Obstructive sleep apnea (adult) (pediatric): Secondary | ICD-10-CM | POA: Diagnosis not present

## 2019-04-20 DIAGNOSIS — D696 Thrombocytopenia, unspecified: Secondary | ICD-10-CM | POA: Diagnosis not present

## 2019-04-20 DIAGNOSIS — I10 Essential (primary) hypertension: Secondary | ICD-10-CM | POA: Diagnosis not present

## 2019-04-20 DIAGNOSIS — E559 Vitamin D deficiency, unspecified: Secondary | ICD-10-CM | POA: Diagnosis not present

## 2019-04-20 DIAGNOSIS — K219 Gastro-esophageal reflux disease without esophagitis: Secondary | ICD-10-CM | POA: Diagnosis not present

## 2019-04-20 DIAGNOSIS — I739 Peripheral vascular disease, unspecified: Secondary | ICD-10-CM | POA: Diagnosis not present

## 2019-04-22 ENCOUNTER — Other Ambulatory Visit: Payer: Self-pay | Admitting: Pharmacist

## 2019-04-22 DIAGNOSIS — I10 Essential (primary) hypertension: Secondary | ICD-10-CM | POA: Diagnosis not present

## 2019-04-22 DIAGNOSIS — E785 Hyperlipidemia, unspecified: Secondary | ICD-10-CM | POA: Diagnosis not present

## 2019-04-22 DIAGNOSIS — I5022 Chronic systolic (congestive) heart failure: Secondary | ICD-10-CM | POA: Diagnosis not present

## 2019-04-22 DIAGNOSIS — G4733 Obstructive sleep apnea (adult) (pediatric): Secondary | ICD-10-CM | POA: Diagnosis not present

## 2019-04-22 NOTE — Patient Outreach (Signed)
[place] [facility]) Care Management  [place]   04/22/2019  Eddie Chapman [birth year] [id number]  Reason for referral: Medication Assistance  Referral source: [employer] Current insurance: [employer]  PMHx includes but not limited to:  OSA, CHF, obesity, PVD, chronic pain, HTN, HLD, GERD, depression, hypotestosteronism, vitamin D deficiency, SAH with TBI following fall in 2016  Outreach:  Unsuccessful telephone call attempt #1 to patient. HIPAA compliant voicemail left requesting a return call  Plan:  -I will mail patient an unsuccessful outreach letter.  -I will make another outreach attempt to patient within 3-4 business days.    Ralene Bathe, PharmD, [place] [phone]

## 2019-04-28 ENCOUNTER — Ambulatory Visit: Payer: Self-pay | Admitting: Pharmacist

## 2019-04-28 ENCOUNTER — Other Ambulatory Visit: Payer: Self-pay | Admitting: Pharmacist

## 2019-04-28 NOTE — Patient Outreach (Signed)
[place] [facility]) Care Management  [place]  04/28/2019  Eddie Chapman 1960/10/26 [id number]  Reason for referral: Medication Assistance  Referral source: [employer] Current insurance: [employer]  PMHx includes but not limited to:  OSA, CHF, obesity, PVD, chronic pain, HTN, HLD, GERD, depression, hypotestosteronism, vitamin D deficiency, SAH with TBI following fall in 2016  Outreach:  Unsuccessful telephone call attempt #3 to patient.   HIPAA compliant voicemail left requesting a return call  Plan:  -I will make another outreach attempt to patient within 3-4 business days.    Ralene Bathe, PharmD, [place] [phone]

## 2019-05-05 ENCOUNTER — Ambulatory Visit: Payer: Self-pay | Admitting: Pharmacist

## 2019-05-05 ENCOUNTER — Other Ambulatory Visit: Payer: Self-pay | Admitting: Pharmacist

## 2019-05-05 NOTE — Patient Outreach (Signed)
[place] [facility]) Care Management [place]  05/05/2019  VASHAUN OSMON 07-09-1961 [id number]  Reason for referral: medication assistance  Outreach:  Unsuccessful telephone call attempt #3 to patient. HIPAA compliant voicemail left requesting a return call  Plan:  -I will close [place] case at this time as I have been unable to establish and/or maintain contact with patient.  -I am happy to assist in the future as needed.   Ralene Bathe, PharmD, [place] [phone]

## 2019-06-09 DIAGNOSIS — G4733 Obstructive sleep apnea (adult) (pediatric): Secondary | ICD-10-CM | POA: Diagnosis not present

## 2019-07-09 DIAGNOSIS — G4733 Obstructive sleep apnea (adult) (pediatric): Secondary | ICD-10-CM | POA: Diagnosis not present

## 2019-07-13 DIAGNOSIS — H903 Sensorineural hearing loss, bilateral: Secondary | ICD-10-CM | POA: Diagnosis not present

## 2019-07-28 DIAGNOSIS — G4733 Obstructive sleep apnea (adult) (pediatric): Secondary | ICD-10-CM | POA: Diagnosis not present

## 2019-07-28 DIAGNOSIS — I739 Peripheral vascular disease, unspecified: Secondary | ICD-10-CM | POA: Diagnosis not present

## 2019-07-28 DIAGNOSIS — I10 Essential (primary) hypertension: Secondary | ICD-10-CM | POA: Diagnosis not present

## 2019-07-28 DIAGNOSIS — I5022 Chronic systolic (congestive) heart failure: Secondary | ICD-10-CM | POA: Diagnosis not present

## 2019-07-28 DIAGNOSIS — D696 Thrombocytopenia, unspecified: Secondary | ICD-10-CM | POA: Diagnosis not present

## 2019-07-28 DIAGNOSIS — E785 Hyperlipidemia, unspecified: Secondary | ICD-10-CM | POA: Diagnosis not present

## 2019-08-09 DIAGNOSIS — G4733 Obstructive sleep apnea (adult) (pediatric): Secondary | ICD-10-CM | POA: Diagnosis not present

## 2019-09-13 DIAGNOSIS — D696 Thrombocytopenia, unspecified: Secondary | ICD-10-CM | POA: Diagnosis not present

## 2019-09-13 DIAGNOSIS — G894 Chronic pain syndrome: Secondary | ICD-10-CM | POA: Diagnosis not present

## 2019-09-13 DIAGNOSIS — G4733 Obstructive sleep apnea (adult) (pediatric): Secondary | ICD-10-CM | POA: Diagnosis not present

## 2019-09-13 DIAGNOSIS — I10 Essential (primary) hypertension: Secondary | ICD-10-CM | POA: Diagnosis not present

## 2019-09-13 DIAGNOSIS — I739 Peripheral vascular disease, unspecified: Secondary | ICD-10-CM | POA: Diagnosis not present

## 2019-09-13 DIAGNOSIS — Z125 Encounter for screening for malignant neoplasm of prostate: Secondary | ICD-10-CM | POA: Diagnosis not present

## 2019-09-13 DIAGNOSIS — E785 Hyperlipidemia, unspecified: Secondary | ICD-10-CM | POA: Diagnosis not present

## 2019-09-20 DIAGNOSIS — I739 Peripheral vascular disease, unspecified: Secondary | ICD-10-CM | POA: Diagnosis not present

## 2019-09-20 DIAGNOSIS — E349 Endocrine disorder, unspecified: Secondary | ICD-10-CM | POA: Diagnosis not present

## 2019-09-20 DIAGNOSIS — E559 Vitamin D deficiency, unspecified: Secondary | ICD-10-CM | POA: Diagnosis not present

## 2019-09-20 DIAGNOSIS — G894 Chronic pain syndrome: Secondary | ICD-10-CM | POA: Diagnosis not present

## 2019-09-20 DIAGNOSIS — I10 Essential (primary) hypertension: Secondary | ICD-10-CM | POA: Diagnosis not present

## 2019-09-20 DIAGNOSIS — Z0000 Encounter for general adult medical examination without abnormal findings: Secondary | ICD-10-CM | POA: Diagnosis not present

## 2019-09-20 DIAGNOSIS — K219 Gastro-esophageal reflux disease without esophagitis: Secondary | ICD-10-CM | POA: Diagnosis not present

## 2019-09-20 DIAGNOSIS — I671 Cerebral aneurysm, nonruptured: Secondary | ICD-10-CM | POA: Diagnosis not present

## 2019-09-20 DIAGNOSIS — D696 Thrombocytopenia, unspecified: Secondary | ICD-10-CM | POA: Diagnosis not present

## 2019-09-20 DIAGNOSIS — G4734 Idiopathic sleep related nonobstructive alveolar hypoventilation: Secondary | ICD-10-CM | POA: Diagnosis not present

## 2019-09-20 DIAGNOSIS — I5022 Chronic systolic (congestive) heart failure: Secondary | ICD-10-CM | POA: Diagnosis not present

## 2019-09-20 DIAGNOSIS — G4733 Obstructive sleep apnea (adult) (pediatric): Secondary | ICD-10-CM | POA: Diagnosis not present

## 2019-10-18 DIAGNOSIS — E559 Vitamin D deficiency, unspecified: Secondary | ICD-10-CM | POA: Diagnosis not present

## 2019-10-18 DIAGNOSIS — I739 Peripheral vascular disease, unspecified: Secondary | ICD-10-CM | POA: Diagnosis not present

## 2019-10-18 DIAGNOSIS — I5022 Chronic systolic (congestive) heart failure: Secondary | ICD-10-CM | POA: Diagnosis not present

## 2019-10-18 DIAGNOSIS — E785 Hyperlipidemia, unspecified: Secondary | ICD-10-CM | POA: Diagnosis not present

## 2019-10-18 DIAGNOSIS — G894 Chronic pain syndrome: Secondary | ICD-10-CM | POA: Diagnosis not present

## 2019-10-18 DIAGNOSIS — E349 Endocrine disorder, unspecified: Secondary | ICD-10-CM | POA: Diagnosis not present

## 2019-10-18 DIAGNOSIS — G4734 Idiopathic sleep related nonobstructive alveolar hypoventilation: Secondary | ICD-10-CM | POA: Diagnosis not present

## 2019-10-18 DIAGNOSIS — I10 Essential (primary) hypertension: Secondary | ICD-10-CM | POA: Diagnosis not present

## 2019-10-18 DIAGNOSIS — I671 Cerebral aneurysm, nonruptured: Secondary | ICD-10-CM | POA: Diagnosis not present

## 2019-10-18 DIAGNOSIS — G4733 Obstructive sleep apnea (adult) (pediatric): Secondary | ICD-10-CM | POA: Diagnosis not present

## 2019-10-18 DIAGNOSIS — D696 Thrombocytopenia, unspecified: Secondary | ICD-10-CM | POA: Diagnosis not present

## 2019-10-28 DIAGNOSIS — J301 Allergic rhinitis due to pollen: Secondary | ICD-10-CM | POA: Diagnosis not present

## 2019-10-28 DIAGNOSIS — R42 Dizziness and giddiness: Secondary | ICD-10-CM | POA: Diagnosis not present

## 2019-10-28 DIAGNOSIS — H903 Sensorineural hearing loss, bilateral: Secondary | ICD-10-CM | POA: Diagnosis not present

## 2019-11-01 DIAGNOSIS — I272 Pulmonary hypertension, unspecified: Secondary | ICD-10-CM | POA: Diagnosis not present

## 2019-11-01 DIAGNOSIS — I5022 Chronic systolic (congestive) heart failure: Secondary | ICD-10-CM | POA: Diagnosis not present

## 2019-11-15 DIAGNOSIS — F3342 Major depressive disorder, recurrent, in full remission: Secondary | ICD-10-CM | POA: Diagnosis not present

## 2019-11-15 DIAGNOSIS — S22060S Wedge compression fracture of T7-T8 vertebra, sequela: Secondary | ICD-10-CM | POA: Diagnosis not present

## 2019-11-15 DIAGNOSIS — G894 Chronic pain syndrome: Secondary | ICD-10-CM | POA: Diagnosis not present

## 2019-11-15 DIAGNOSIS — E559 Vitamin D deficiency, unspecified: Secondary | ICD-10-CM | POA: Diagnosis not present

## 2019-11-15 DIAGNOSIS — K219 Gastro-esophageal reflux disease without esophagitis: Secondary | ICD-10-CM | POA: Diagnosis not present

## 2019-11-15 DIAGNOSIS — I739 Peripheral vascular disease, unspecified: Secondary | ICD-10-CM | POA: Diagnosis not present

## 2019-11-15 DIAGNOSIS — I10 Essential (primary) hypertension: Secondary | ICD-10-CM | POA: Diagnosis not present

## 2019-11-15 DIAGNOSIS — I671 Cerebral aneurysm, nonruptured: Secondary | ICD-10-CM | POA: Diagnosis not present

## 2019-11-15 DIAGNOSIS — G4733 Obstructive sleep apnea (adult) (pediatric): Secondary | ICD-10-CM | POA: Diagnosis not present

## 2019-11-15 DIAGNOSIS — E349 Endocrine disorder, unspecified: Secondary | ICD-10-CM | POA: Diagnosis not present

## 2019-11-15 DIAGNOSIS — I5022 Chronic systolic (congestive) heart failure: Secondary | ICD-10-CM | POA: Diagnosis not present

## 2019-11-15 DIAGNOSIS — E785 Hyperlipidemia, unspecified: Secondary | ICD-10-CM | POA: Diagnosis not present

## 2019-11-29 DIAGNOSIS — I272 Pulmonary hypertension, unspecified: Secondary | ICD-10-CM | POA: Diagnosis not present

## 2019-11-29 DIAGNOSIS — I5022 Chronic systolic (congestive) heart failure: Secondary | ICD-10-CM | POA: Diagnosis not present

## 2019-12-30 DIAGNOSIS — I5022 Chronic systolic (congestive) heart failure: Secondary | ICD-10-CM | POA: Diagnosis not present

## 2019-12-30 DIAGNOSIS — I272 Pulmonary hypertension, unspecified: Secondary | ICD-10-CM | POA: Diagnosis not present

## 2020-01-27 DIAGNOSIS — E785 Hyperlipidemia, unspecified: Secondary | ICD-10-CM | POA: Diagnosis not present

## 2020-01-27 DIAGNOSIS — I739 Peripheral vascular disease, unspecified: Secondary | ICD-10-CM | POA: Diagnosis not present

## 2020-01-27 DIAGNOSIS — I10 Essential (primary) hypertension: Secondary | ICD-10-CM | POA: Diagnosis not present

## 2020-01-27 DIAGNOSIS — I671 Cerebral aneurysm, nonruptured: Secondary | ICD-10-CM | POA: Diagnosis not present

## 2020-01-27 DIAGNOSIS — I5022 Chronic systolic (congestive) heart failure: Secondary | ICD-10-CM | POA: Diagnosis not present

## 2020-01-27 DIAGNOSIS — J9611 Chronic respiratory failure with hypoxia: Secondary | ICD-10-CM | POA: Diagnosis not present

## 2020-01-27 DIAGNOSIS — G4733 Obstructive sleep apnea (adult) (pediatric): Secondary | ICD-10-CM | POA: Diagnosis not present

## 2020-01-29 DIAGNOSIS — I272 Pulmonary hypertension, unspecified: Secondary | ICD-10-CM | POA: Diagnosis not present

## 2020-01-29 DIAGNOSIS — I5022 Chronic systolic (congestive) heart failure: Secondary | ICD-10-CM | POA: Diagnosis not present

## 2020-02-10 DIAGNOSIS — E785 Hyperlipidemia, unspecified: Secondary | ICD-10-CM | POA: Diagnosis not present

## 2020-02-10 DIAGNOSIS — G4733 Obstructive sleep apnea (adult) (pediatric): Secondary | ICD-10-CM | POA: Diagnosis not present

## 2020-02-10 DIAGNOSIS — K219 Gastro-esophageal reflux disease without esophagitis: Secondary | ICD-10-CM | POA: Diagnosis not present

## 2020-02-10 DIAGNOSIS — I10 Essential (primary) hypertension: Secondary | ICD-10-CM | POA: Diagnosis not present

## 2020-02-10 DIAGNOSIS — I739 Peripheral vascular disease, unspecified: Secondary | ICD-10-CM | POA: Diagnosis not present

## 2020-02-14 DIAGNOSIS — S22060S Wedge compression fracture of T7-T8 vertebra, sequela: Secondary | ICD-10-CM | POA: Diagnosis not present

## 2020-02-14 DIAGNOSIS — J9611 Chronic respiratory failure with hypoxia: Secondary | ICD-10-CM | POA: Diagnosis not present

## 2020-02-14 DIAGNOSIS — I739 Peripheral vascular disease, unspecified: Secondary | ICD-10-CM | POA: Diagnosis not present

## 2020-02-14 DIAGNOSIS — F3342 Major depressive disorder, recurrent, in full remission: Secondary | ICD-10-CM | POA: Diagnosis not present

## 2020-02-14 DIAGNOSIS — E785 Hyperlipidemia, unspecified: Secondary | ICD-10-CM | POA: Diagnosis not present

## 2020-02-14 DIAGNOSIS — M47814 Spondylosis without myelopathy or radiculopathy, thoracic region: Secondary | ICD-10-CM | POA: Diagnosis not present

## 2020-02-14 DIAGNOSIS — I10 Essential (primary) hypertension: Secondary | ICD-10-CM | POA: Diagnosis not present

## 2020-02-14 DIAGNOSIS — I5022 Chronic systolic (congestive) heart failure: Secondary | ICD-10-CM | POA: Diagnosis not present

## 2020-02-14 DIAGNOSIS — G4734 Idiopathic sleep related nonobstructive alveolar hypoventilation: Secondary | ICD-10-CM | POA: Diagnosis not present

## 2020-02-14 DIAGNOSIS — G4733 Obstructive sleep apnea (adult) (pediatric): Secondary | ICD-10-CM | POA: Diagnosis not present

## 2020-02-14 DIAGNOSIS — I671 Cerebral aneurysm, nonruptured: Secondary | ICD-10-CM | POA: Diagnosis not present

## 2020-02-14 DIAGNOSIS — K219 Gastro-esophageal reflux disease without esophagitis: Secondary | ICD-10-CM | POA: Diagnosis not present

## 2020-02-29 DIAGNOSIS — I272 Pulmonary hypertension, unspecified: Secondary | ICD-10-CM | POA: Diagnosis not present

## 2020-02-29 DIAGNOSIS — I5022 Chronic systolic (congestive) heart failure: Secondary | ICD-10-CM | POA: Diagnosis not present

## 2020-03-30 DIAGNOSIS — I5022 Chronic systolic (congestive) heart failure: Secondary | ICD-10-CM | POA: Diagnosis not present

## 2020-03-30 DIAGNOSIS — I272 Pulmonary hypertension, unspecified: Secondary | ICD-10-CM | POA: Diagnosis not present

## 2020-04-30 DIAGNOSIS — I5022 Chronic systolic (congestive) heart failure: Secondary | ICD-10-CM | POA: Diagnosis not present

## 2020-04-30 DIAGNOSIS — I272 Pulmonary hypertension, unspecified: Secondary | ICD-10-CM | POA: Diagnosis not present

## 2020-05-09 DIAGNOSIS — Z79891 Long term (current) use of opiate analgesic: Secondary | ICD-10-CM | POA: Diagnosis not present

## 2020-05-09 DIAGNOSIS — I10 Essential (primary) hypertension: Secondary | ICD-10-CM | POA: Diagnosis not present

## 2020-05-09 DIAGNOSIS — E349 Endocrine disorder, unspecified: Secondary | ICD-10-CM | POA: Diagnosis not present

## 2020-05-09 DIAGNOSIS — E785 Hyperlipidemia, unspecified: Secondary | ICD-10-CM | POA: Diagnosis not present

## 2020-05-09 DIAGNOSIS — D696 Thrombocytopenia, unspecified: Secondary | ICD-10-CM | POA: Diagnosis not present

## 2020-05-16 DIAGNOSIS — E559 Vitamin D deficiency, unspecified: Secondary | ICD-10-CM | POA: Diagnosis not present

## 2020-05-16 DIAGNOSIS — K219 Gastro-esophageal reflux disease without esophagitis: Secondary | ICD-10-CM | POA: Diagnosis not present

## 2020-05-16 DIAGNOSIS — G4734 Idiopathic sleep related nonobstructive alveolar hypoventilation: Secondary | ICD-10-CM | POA: Diagnosis not present

## 2020-05-16 DIAGNOSIS — I5022 Chronic systolic (congestive) heart failure: Secondary | ICD-10-CM | POA: Diagnosis not present

## 2020-05-16 DIAGNOSIS — E349 Endocrine disorder, unspecified: Secondary | ICD-10-CM | POA: Diagnosis not present

## 2020-05-16 DIAGNOSIS — D696 Thrombocytopenia, unspecified: Secondary | ICD-10-CM | POA: Diagnosis not present

## 2020-05-16 DIAGNOSIS — I739 Peripheral vascular disease, unspecified: Secondary | ICD-10-CM | POA: Diagnosis not present

## 2020-05-16 DIAGNOSIS — J9611 Chronic respiratory failure with hypoxia: Secondary | ICD-10-CM | POA: Diagnosis not present

## 2020-05-16 DIAGNOSIS — L918 Other hypertrophic disorders of the skin: Secondary | ICD-10-CM | POA: Diagnosis not present

## 2020-05-16 DIAGNOSIS — I10 Essential (primary) hypertension: Secondary | ICD-10-CM | POA: Diagnosis not present

## 2020-05-16 DIAGNOSIS — E785 Hyperlipidemia, unspecified: Secondary | ICD-10-CM | POA: Diagnosis not present

## 2020-05-31 DIAGNOSIS — I5022 Chronic systolic (congestive) heart failure: Secondary | ICD-10-CM | POA: Diagnosis not present

## 2020-05-31 DIAGNOSIS — I272 Pulmonary hypertension, unspecified: Secondary | ICD-10-CM | POA: Diagnosis not present

## 2020-08-02 DIAGNOSIS — I10 Essential (primary) hypertension: Secondary | ICD-10-CM | POA: Diagnosis not present

## 2020-08-02 DIAGNOSIS — I5022 Chronic systolic (congestive) heart failure: Secondary | ICD-10-CM | POA: Diagnosis not present

## 2020-08-02 DIAGNOSIS — G4733 Obstructive sleep apnea (adult) (pediatric): Secondary | ICD-10-CM | POA: Diagnosis not present

## 2020-08-02 DIAGNOSIS — I671 Cerebral aneurysm, nonruptured: Secondary | ICD-10-CM | POA: Diagnosis not present

## 2020-08-02 DIAGNOSIS — J9611 Chronic respiratory failure with hypoxia: Secondary | ICD-10-CM | POA: Diagnosis not present

## 2020-08-02 DIAGNOSIS — E559 Vitamin D deficiency, unspecified: Secondary | ICD-10-CM | POA: Diagnosis not present

## 2020-08-02 DIAGNOSIS — I739 Peripheral vascular disease, unspecified: Secondary | ICD-10-CM | POA: Diagnosis not present

## 2020-08-02 DIAGNOSIS — E785 Hyperlipidemia, unspecified: Secondary | ICD-10-CM | POA: Diagnosis not present

## 2020-08-02 DIAGNOSIS — G4734 Idiopathic sleep related nonobstructive alveolar hypoventilation: Secondary | ICD-10-CM | POA: Diagnosis not present

## 2020-08-29 DIAGNOSIS — I5022 Chronic systolic (congestive) heart failure: Secondary | ICD-10-CM | POA: Diagnosis not present

## 2020-08-30 DIAGNOSIS — J9819 Other pulmonary collapse: Secondary | ICD-10-CM

## 2020-08-30 HISTORY — DX: Other pulmonary collapse: J98.19

## 2020-08-31 DIAGNOSIS — G4733 Obstructive sleep apnea (adult) (pediatric): Secondary | ICD-10-CM | POA: Diagnosis not present

## 2020-08-31 DIAGNOSIS — I5022 Chronic systolic (congestive) heart failure: Secondary | ICD-10-CM | POA: Diagnosis not present

## 2020-08-31 DIAGNOSIS — I272 Pulmonary hypertension, unspecified: Secondary | ICD-10-CM | POA: Diagnosis not present

## 2020-08-31 DIAGNOSIS — I10 Essential (primary) hypertension: Secondary | ICD-10-CM | POA: Diagnosis not present

## 2020-08-31 DIAGNOSIS — I739 Peripheral vascular disease, unspecified: Secondary | ICD-10-CM | POA: Diagnosis not present

## 2020-08-31 DIAGNOSIS — J9611 Chronic respiratory failure with hypoxia: Secondary | ICD-10-CM | POA: Diagnosis not present

## 2020-08-31 DIAGNOSIS — E785 Hyperlipidemia, unspecified: Secondary | ICD-10-CM | POA: Diagnosis not present

## 2020-09-21 ENCOUNTER — Emergency Department: Payer: PPO

## 2020-09-21 ENCOUNTER — Encounter: Payer: Self-pay | Admitting: Pulmonary Disease

## 2020-09-21 ENCOUNTER — Other Ambulatory Visit: Payer: Self-pay

## 2020-09-21 ENCOUNTER — Inpatient Hospital Stay: Payer: PPO

## 2020-09-21 ENCOUNTER — Inpatient Hospital Stay
Admission: EM | Admit: 2020-09-21 | Discharge: 2020-10-20 | DRG: 870 | Disposition: A | Discharge status: Skilled Nursing Facility | Payer: PPO | Attending: Internal Medicine | Admitting: Internal Medicine

## 2020-09-21 DIAGNOSIS — G9341 Metabolic encephalopathy: Secondary | ICD-10-CM | POA: Diagnosis not present

## 2020-09-21 DIAGNOSIS — Z20822 Contact with and (suspected) exposure to covid-19: Secondary | ICD-10-CM | POA: Diagnosis not present

## 2020-09-21 DIAGNOSIS — E785 Hyperlipidemia, unspecified: Secondary | ICD-10-CM | POA: Diagnosis not present

## 2020-09-21 DIAGNOSIS — A419 Sepsis, unspecified organism: Secondary | ICD-10-CM | POA: Diagnosis not present

## 2020-09-21 DIAGNOSIS — N179 Acute kidney failure, unspecified: Secondary | ICD-10-CM | POA: Diagnosis present

## 2020-09-21 DIAGNOSIS — I248 Other forms of acute ischemic heart disease: Secondary | ICD-10-CM | POA: Diagnosis present

## 2020-09-21 DIAGNOSIS — R29818 Other symptoms and signs involving the nervous system: Secondary | ICD-10-CM | POA: Diagnosis not present

## 2020-09-21 DIAGNOSIS — G4733 Obstructive sleep apnea (adult) (pediatric): Secondary | ICD-10-CM | POA: Diagnosis not present

## 2020-09-21 DIAGNOSIS — I272 Pulmonary hypertension, unspecified: Secondary | ICD-10-CM | POA: Diagnosis not present

## 2020-09-21 DIAGNOSIS — Z87820 Personal history of traumatic brain injury: Secondary | ICD-10-CM | POA: Diagnosis not present

## 2020-09-21 DIAGNOSIS — R1312 Dysphagia, oropharyngeal phase: Secondary | ICD-10-CM | POA: Diagnosis not present

## 2020-09-21 DIAGNOSIS — R0689 Other abnormalities of breathing: Secondary | ICD-10-CM | POA: Diagnosis not present

## 2020-09-21 DIAGNOSIS — N170 Acute kidney failure with tubular necrosis: Secondary | ICD-10-CM | POA: Diagnosis not present

## 2020-09-21 DIAGNOSIS — I10 Essential (primary) hypertension: Secondary | ICD-10-CM | POA: Diagnosis not present

## 2020-09-21 DIAGNOSIS — I5031 Acute diastolic (congestive) heart failure: Secondary | ICD-10-CM | POA: Diagnosis not present

## 2020-09-21 DIAGNOSIS — I739 Peripheral vascular disease, unspecified: Secondary | ICD-10-CM | POA: Diagnosis not present

## 2020-09-21 DIAGNOSIS — J9600 Acute respiratory failure, unspecified whether with hypoxia or hypercapnia: Secondary | ICD-10-CM | POA: Diagnosis not present

## 2020-09-21 DIAGNOSIS — I517 Cardiomegaly: Secondary | ICD-10-CM | POA: Diagnosis not present

## 2020-09-21 DIAGNOSIS — T50904A Poisoning by unspecified drugs, medicaments and biological substances, undetermined, initial encounter: Secondary | ICD-10-CM | POA: Diagnosis not present

## 2020-09-21 DIAGNOSIS — J189 Pneumonia, unspecified organism: Secondary | ICD-10-CM | POA: Diagnosis not present

## 2020-09-21 DIAGNOSIS — J811 Chronic pulmonary edema: Secondary | ICD-10-CM | POA: Diagnosis not present

## 2020-09-21 DIAGNOSIS — J9601 Acute respiratory failure with hypoxia: Secondary | ICD-10-CM

## 2020-09-21 DIAGNOSIS — M7989 Other specified soft tissue disorders: Secondary | ICD-10-CM | POA: Diagnosis not present

## 2020-09-21 DIAGNOSIS — Z86010 Personal history of colonic polyps: Secondary | ICD-10-CM

## 2020-09-21 DIAGNOSIS — J90 Pleural effusion, not elsewhere classified: Secondary | ICD-10-CM | POA: Diagnosis not present

## 2020-09-21 DIAGNOSIS — Z0189 Encounter for other specified special examinations: Secondary | ICD-10-CM

## 2020-09-21 DIAGNOSIS — G9389 Other specified disorders of brain: Secondary | ICD-10-CM | POA: Diagnosis present

## 2020-09-21 DIAGNOSIS — Z7982 Long term (current) use of aspirin: Secondary | ICD-10-CM | POA: Diagnosis not present

## 2020-09-21 DIAGNOSIS — I5022 Chronic systolic (congestive) heart failure: Secondary | ICD-10-CM | POA: Diagnosis not present

## 2020-09-21 DIAGNOSIS — E876 Hypokalemia: Secondary | ICD-10-CM | POA: Diagnosis not present

## 2020-09-21 DIAGNOSIS — F32A Depression, unspecified: Secondary | ICD-10-CM | POA: Diagnosis present

## 2020-09-21 DIAGNOSIS — J9690 Respiratory failure, unspecified, unspecified whether with hypoxia or hypercapnia: Secondary | ICD-10-CM

## 2020-09-21 DIAGNOSIS — I671 Cerebral aneurysm, nonruptured: Secondary | ICD-10-CM | POA: Diagnosis present

## 2020-09-21 DIAGNOSIS — R4182 Altered mental status, unspecified: Secondary | ICD-10-CM | POA: Diagnosis not present

## 2020-09-21 DIAGNOSIS — R6521 Severe sepsis with septic shock: Secondary | ICD-10-CM | POA: Diagnosis present

## 2020-09-21 DIAGNOSIS — R4020 Unspecified coma: Secondary | ICD-10-CM | POA: Diagnosis not present

## 2020-09-21 DIAGNOSIS — G9340 Encephalopathy, unspecified: Secondary | ICD-10-CM | POA: Diagnosis not present

## 2020-09-21 DIAGNOSIS — I350 Nonrheumatic aortic (valve) stenosis: Secondary | ICD-10-CM | POA: Diagnosis not present

## 2020-09-21 DIAGNOSIS — F419 Anxiety disorder, unspecified: Secondary | ICD-10-CM | POA: Diagnosis present

## 2020-09-21 DIAGNOSIS — Z6841 Body Mass Index (BMI) 40.0 and over, adult: Secondary | ICD-10-CM | POA: Diagnosis not present

## 2020-09-21 DIAGNOSIS — E662 Morbid (severe) obesity with alveolar hypoventilation: Secondary | ICD-10-CM | POA: Diagnosis present

## 2020-09-21 DIAGNOSIS — R5381 Other malaise: Secondary | ICD-10-CM | POA: Diagnosis present

## 2020-09-21 DIAGNOSIS — J690 Pneumonitis due to inhalation of food and vomit: Secondary | ICD-10-CM | POA: Diagnosis not present

## 2020-09-21 DIAGNOSIS — J9602 Acute respiratory failure with hypercapnia: Secondary | ICD-10-CM | POA: Diagnosis present

## 2020-09-21 DIAGNOSIS — I110 Hypertensive heart disease with heart failure: Secondary | ICD-10-CM | POA: Diagnosis present

## 2020-09-21 DIAGNOSIS — I469 Cardiac arrest, cause unspecified: Secondary | ICD-10-CM | POA: Diagnosis present

## 2020-09-21 DIAGNOSIS — G928 Other toxic encephalopathy: Secondary | ICD-10-CM | POA: Diagnosis not present

## 2020-09-21 DIAGNOSIS — S2242XA Multiple fractures of ribs, left side, initial encounter for closed fracture: Secondary | ICD-10-CM | POA: Diagnosis not present

## 2020-09-21 DIAGNOSIS — Z515 Encounter for palliative care: Secondary | ICD-10-CM | POA: Diagnosis not present

## 2020-09-21 DIAGNOSIS — Z7189 Other specified counseling: Secondary | ICD-10-CM | POA: Diagnosis not present

## 2020-09-21 DIAGNOSIS — Z4659 Encounter for fitting and adjustment of other gastrointestinal appliance and device: Secondary | ICD-10-CM

## 2020-09-21 DIAGNOSIS — J181 Lobar pneumonia, unspecified organism: Secondary | ICD-10-CM | POA: Diagnosis not present

## 2020-09-21 DIAGNOSIS — J9811 Atelectasis: Secondary | ICD-10-CM | POA: Diagnosis not present

## 2020-09-21 DIAGNOSIS — R509 Fever, unspecified: Secondary | ICD-10-CM

## 2020-09-21 DIAGNOSIS — R0682 Tachypnea, not elsewhere classified: Secondary | ICD-10-CM | POA: Diagnosis not present

## 2020-09-21 DIAGNOSIS — J80 Acute respiratory distress syndrome: Secondary | ICD-10-CM | POA: Diagnosis not present

## 2020-09-21 DIAGNOSIS — K219 Gastro-esophageal reflux disease without esophagitis: Secondary | ICD-10-CM | POA: Diagnosis present

## 2020-09-21 DIAGNOSIS — Z79899 Other long term (current) drug therapy: Secondary | ICD-10-CM

## 2020-09-21 DIAGNOSIS — R0902 Hypoxemia: Secondary | ICD-10-CM | POA: Diagnosis not present

## 2020-09-21 DIAGNOSIS — R404 Transient alteration of awareness: Secondary | ICD-10-CM | POA: Diagnosis not present

## 2020-09-21 DIAGNOSIS — I5033 Acute on chronic diastolic (congestive) heart failure: Secondary | ICD-10-CM | POA: Diagnosis not present

## 2020-09-21 DIAGNOSIS — Z01818 Encounter for other preprocedural examination: Secondary | ICD-10-CM

## 2020-09-21 DIAGNOSIS — I700 Atherosclerosis of aorta: Secondary | ICD-10-CM | POA: Diagnosis present

## 2020-09-21 DIAGNOSIS — M6281 Muscle weakness (generalized): Secondary | ICD-10-CM | POA: Diagnosis not present

## 2020-09-21 DIAGNOSIS — R000 Tachycardia, unspecified: Secondary | ICD-10-CM | POA: Diagnosis not present

## 2020-09-21 DIAGNOSIS — Z4682 Encounter for fitting and adjustment of non-vascular catheter: Secondary | ICD-10-CM | POA: Diagnosis not present

## 2020-09-21 DIAGNOSIS — Z452 Encounter for adjustment and management of vascular access device: Secondary | ICD-10-CM

## 2020-09-21 DIAGNOSIS — K920 Hematemesis: Secondary | ICD-10-CM | POA: Diagnosis not present

## 2020-09-21 LAB — CBC WITH DIFFERENTIAL/PLATELET
Abs Immature Granulocytes: 0.17 10*3/uL — ABNORMAL HIGH (ref 0.00–0.07)
Basophils Absolute: 0 10*3/uL (ref 0.0–0.1)
Basophils Relative: 0 %
Eosinophils Absolute: 0 10*3/uL (ref 0.0–0.5)
Eosinophils Relative: 0 %
HCT: 62.8 % — ABNORMAL HIGH (ref 39.0–52.0)
Hemoglobin: 19 g/dL — ABNORMAL HIGH (ref 13.0–17.0)
Immature Granulocytes: 1 %
Lymphocytes Relative: 3 %
Lymphs Abs: 0.3 10*3/uL — ABNORMAL LOW (ref 0.7–4.0)
MCH: 30.3 pg (ref 26.0–34.0)
MCHC: 30.3 g/dL (ref 30.0–36.0)
MCV: 100 fL (ref 80.0–100.0)
Monocytes Absolute: 1.5 10*3/uL — ABNORMAL HIGH (ref 0.1–1.0)
Monocytes Relative: 12 %
Neutro Abs: 10.5 10*3/uL — ABNORMAL HIGH (ref 1.7–7.7)
Neutrophils Relative %: 84 %
Platelets: 142 10*3/uL — ABNORMAL LOW (ref 150–400)
RBC: 6.28 MIL/uL — ABNORMAL HIGH (ref 4.22–5.81)
RDW: 12.8 % (ref 11.5–15.5)
WBC: 12.5 10*3/uL — ABNORMAL HIGH (ref 4.0–10.5)
nRBC: 0 % (ref 0.0–0.2)

## 2020-09-21 LAB — BLOOD GAS, ARTERIAL
Acid-Base Excess: 6.9 mmol/L — ABNORMAL HIGH (ref 0.0–2.0)
Bicarbonate: 38.6 mmol/L — ABNORMAL HIGH (ref 20.0–28.0)
FIO2: 0.5
MECHVT: 500 mL
O2 Saturation: 89.1 %
PEEP: 5 cmH2O
Patient temperature: 37
RATE: 18 resp/min
pCO2 arterial: 88 mmHg (ref 32.0–48.0)
pH, Arterial: 7.25 — ABNORMAL LOW (ref 7.350–7.450)
pO2, Arterial: 66 mmHg — ABNORMAL LOW (ref 83.0–108.0)

## 2020-09-21 LAB — LACTIC ACID, PLASMA
Lactic Acid, Venous: 2 mmol/L (ref 0.5–1.9)
Lactic Acid, Venous: 2.7 mmol/L (ref 0.5–1.9)
Lactic Acid, Venous: 3.4 mmol/L (ref 0.5–1.9)
Lactic Acid, Venous: 1.9 mmol/L (ref 0.5–1.9)

## 2020-09-21 LAB — URINALYSIS, COMPLETE (UACMP) WITH MICROSCOPIC
Bilirubin Urine: NEGATIVE
Glucose, UA: NEGATIVE mg/dL
Ketones, ur: NEGATIVE mg/dL
Leukocytes,Ua: NEGATIVE
Nitrite: NEGATIVE
Protein, ur: >=300 mg/dL — AB
Specific Gravity, Urine: 1.022 (ref 1.005–1.030)
Squamous Epithelial / LPF: NONE SEEN (ref 0–5)
pH: 5 (ref 5.0–8.0)

## 2020-09-21 LAB — COMPREHENSIVE METABOLIC PANEL
ALT: 33 U/L (ref 0–44)
AST: 33 U/L (ref 15–41)
Albumin: 3.1 g/dL — ABNORMAL LOW (ref 3.5–5.0)
Alkaline Phosphatase: 54 U/L (ref 38–126)
Anion gap: 13 (ref 5–15)
BUN: 18 mg/dL (ref 6–20)
CO2: 33 mmol/L — ABNORMAL HIGH (ref 22–32)
Calcium: 8.5 mg/dL — ABNORMAL LOW (ref 8.9–10.3)
Chloride: 97 mmol/L — ABNORMAL LOW (ref 98–111)
Creatinine, Ser: 1.17 mg/dL (ref 0.61–1.24)
GFR, Estimated: >60 mL/min (ref >60)
Glucose, Bld: 114 mg/dL — ABNORMAL HIGH (ref 70–99)
Potassium: 5 mmol/L (ref 3.5–5.1)
Sodium: 143 mmol/L (ref 135–145)
Total Bilirubin: 0.9 mg/dL (ref 0.3–1.2)
Total Protein: 6.1 g/dL — ABNORMAL LOW (ref 6.5–8.1)

## 2020-09-21 LAB — URINE DRUG SCREEN, QUALITATIVE (ARMC ONLY)
Amphetamines, Ur Screen: NOT DETECTED
Barbiturates, Ur Screen: NOT DETECTED
Benzodiazepine, Ur Scrn: NOT DETECTED
Cannabinoid 50 Ng, Ur ~~LOC~~: POSITIVE — AB
Cocaine Metabolite,Ur ~~LOC~~: NOT DETECTED
MDMA (Ecstasy)Ur Screen: NOT DETECTED
Methadone Scn, Ur: NOT DETECTED
Opiate, Ur Screen: POSITIVE — AB
Phencyclidine (PCP) Ur S: NOT DETECTED
Tricyclic, Ur Screen: NOT DETECTED

## 2020-09-21 LAB — ACETAMINOPHEN LEVEL: Acetaminophen (Tylenol), Serum: <10 ug/mL — ABNORMAL LOW (ref 10–30)

## 2020-09-21 LAB — SALICYLATE LEVEL: Salicylate Lvl: <7 mg/dL — ABNORMAL LOW (ref 7.0–30.0)

## 2020-09-21 LAB — TROPONIN I (HIGH SENSITIVITY)
Troponin I (High Sensitivity): 53 ng/L — ABNORMAL HIGH (ref <18)
Troponin I (High Sensitivity): 56 ng/L — ABNORMAL HIGH (ref <18)

## 2020-09-21 LAB — PROCALCITONIN: Procalcitonin: 0.13 ng/mL

## 2020-09-21 LAB — RESP PANEL BY RT-PCR (FLU A&B, COVID) ARPGX2
Influenza A by PCR: NEGATIVE
Influenza B by PCR: NEGATIVE
SARS Coronavirus 2 by RT PCR: NEGATIVE

## 2020-09-21 LAB — MRSA PCR SCREENING: MRSA by PCR: NEGATIVE

## 2020-09-21 LAB — ETHANOL: Alcohol, Ethyl (B): <10 mg/dL (ref <10)

## 2020-09-21 LAB — BRAIN NATRIURETIC PEPTIDE: B Natriuretic Peptide: 343.1 pg/mL — ABNORMAL HIGH (ref 0.0–100.0)

## 2020-09-21 MED ORDER — MIDAZOLAM HCL 2 MG/2ML IJ SOLN
6.0000 mg | Freq: Once | INTRAMUSCULAR | Status: AC
Start: 2020-09-21 — End: 2020-09-21

## 2020-09-21 MED ORDER — LACTATED RINGERS IV SOLN: INTRAVENOUS | Status: DC

## 2020-09-21 MED ORDER — SODIUM CHLORIDE 0.9 % IV SOLN
250.0000 mL | INTRAVENOUS | Status: DC
  Administered 2020-09-23 – 2020-10-09 (×8): 250 mL via INTRAVENOUS

## 2020-09-21 MED ORDER — CHLORHEXIDINE GLUCONATE 0.12 % MT SOLN
15.0000 mL | Freq: Two times a day (BID) | OROMUCOSAL | Status: DC
  Administered 2020-09-21 – 2020-09-24 (×6): 15 mL via OROMUCOSAL
  Filled 2020-09-21 (×3): qty 15

## 2020-09-21 MED ORDER — DEXMEDETOMIDINE HCL IN NACL 400 MCG/100ML IV SOLN: 0.4000 ug/kg/h | INTRAVENOUS | Status: DC

## 2020-09-21 MED ORDER — ORAL CARE MOUTH RINSE
15.0000 mL | Freq: Two times a day (BID) | OROMUCOSAL | Status: DC
  Administered 2020-09-21 – 2020-09-22 (×2): 15 mL via OROMUCOSAL

## 2020-09-21 MED ORDER — LACTATED RINGERS IV BOLUS
500.0000 mL | Freq: Once | INTRAVENOUS | Status: AC
  Administered 2020-09-21: 500 mL via INTRAVENOUS

## 2020-09-21 MED ORDER — ASPIRIN 300 MG RE SUPP: 300.0000 mg | RECTAL | Status: AC

## 2020-09-21 MED ORDER — VANCOMYCIN HCL 1500 MG/300ML IV SOLN
1500.0000 mg | Freq: Once | INTRAVENOUS | Status: AC
  Administered 2020-09-21: 1500 mg via INTRAVENOUS
  Filled 2020-09-21: qty 300

## 2020-09-21 MED ORDER — VANCOMYCIN HCL 1500 MG/300ML IV SOLN
1500.0000 mg | Freq: Once | INTRAVENOUS | Status: DC
  Filled 2020-09-21: qty 300

## 2020-09-21 MED ORDER — POLYETHYLENE GLYCOL 3350 17 G PO PACK: 17.0000 g | PACK | Freq: Every day | ORAL | Status: DC | PRN

## 2020-09-21 MED ORDER — SODIUM CHLORIDE 0.9 % IV SOLN
2.0000 g | Freq: Once | INTRAVENOUS | Status: AC
  Administered 2020-09-21: 2 g via INTRAVENOUS
  Filled 2020-09-21: qty 2

## 2020-09-21 MED ORDER — FENTANYL 2500MCG IN NS 250ML (10MCG/ML) PREMIX INFUSION
0.0000 ug/h | INTRAVENOUS | Status: DC
Start: 2020-09-21 — End: 2020-10-04
  Administered 2020-09-21: 25 ug/h via INTRAVENOUS
  Administered 2020-09-22 – 2020-09-24 (×9): 400 ug/h via INTRAVENOUS
  Administered 2020-09-25: 50 ug/h via INTRAVENOUS
  Administered 2020-09-26 – 2020-09-28 (×4): 150 ug/h via INTRAVENOUS
  Administered 2020-09-29: 300 ug/h via INTRAVENOUS
  Administered 2020-09-29: 150 ug/h via INTRAVENOUS
  Administered 2020-09-30 (×2): 350 ug/h via INTRAVENOUS
  Administered 2020-09-30: 300 ug/h via INTRAVENOUS
  Administered 2020-10-01: 325 ug/h via INTRAVENOUS
  Administered 2020-10-01: 300 ug/h via INTRAVENOUS
  Administered 2020-10-01: 150 ug/h via INTRAVENOUS
  Administered 2020-10-01: 300 ug/h via INTRAVENOUS
  Administered 2020-10-02: 350 ug/h via INTRAVENOUS
  Administered 2020-10-02: 325 ug/h via INTRAVENOUS
  Administered 2020-10-02: 350 ug/h via INTRAVENOUS
  Administered 2020-10-03: 400 ug/h via INTRAVENOUS
  Filled 2020-09-21 (×29): qty 250

## 2020-09-21 MED ORDER — LACTATED RINGERS IV BOLUS (SEPSIS)
1000.0000 mL | Freq: Once | INTRAVENOUS | Status: AC
  Administered 2020-09-21: 1000 mL via INTRAVENOUS

## 2020-09-21 MED ORDER — FENTANYL CITRATE (PF) 100 MCG/2ML IJ SOLN: 100.0000 ug | Freq: Once | INTRAMUSCULAR | Status: AC

## 2020-09-21 MED ORDER — HEPARIN SODIUM (PORCINE) 5000 UNIT/ML IJ SOLN
5000.0000 [IU] | Freq: Three times a day (TID) | INTRAMUSCULAR | Status: DC
  Administered 2020-09-21 – 2020-09-26 (×15): 5000 [IU] via SUBCUTANEOUS
  Filled 2020-09-21 (×15): qty 1

## 2020-09-21 MED ORDER — VANCOMYCIN HCL IN DEXTROSE 1-5 GM/200ML-% IV SOLN
1000.0000 mg | Freq: Once | INTRAVENOUS | Status: AC
  Administered 2020-09-21: 1000 mg via INTRAVENOUS
  Filled 2020-09-21: qty 200

## 2020-09-21 MED ORDER — ASPIRIN 81 MG PO CHEW
324.0000 mg | CHEWABLE_TABLET | ORAL | Status: AC
  Administered 2020-09-21: 324 mg via ORAL
  Filled 2020-09-21: qty 4

## 2020-09-21 MED ORDER — FENTANYL CITRATE (PF) 100 MCG/2ML IJ SOLN
INTRAMUSCULAR | Status: AC
  Administered 2020-09-21: 100 ug via INTRAVENOUS
  Filled 2020-09-21: qty 2

## 2020-09-21 MED ORDER — MIDAZOLAM 50MG/50ML (1MG/ML) PREMIX INFUSION
0.5000 mg/h | INTRAVENOUS | Status: DC
  Administered 2020-09-21: 0.5 mg/h via INTRAVENOUS
  Administered 2020-09-22 – 2020-09-24 (×8): 6 mg/h via INTRAVENOUS
  Administered 2020-09-25: 1.5 mg/h via INTRAVENOUS
  Filled 2020-09-21 (×10): qty 50

## 2020-09-21 MED ORDER — LACTATED RINGERS IV BOLUS (SEPSIS): 400.0000 mL | Freq: Once | INTRAVENOUS | Status: DC

## 2020-09-21 MED ORDER — MIDAZOLAM HCL 2 MG/2ML IJ SOLN
INTRAMUSCULAR | Status: AC
Start: 2020-09-21 — End: 2020-09-21
  Administered 2020-09-21: 6 mg via INTRAVENOUS
  Filled 2020-09-21: qty 6

## 2020-09-21 MED ORDER — PIPERACILLIN-TAZOBACTAM 3.375 G IVPB
3.3750 g | Freq: Three times a day (TID) | INTRAVENOUS | Status: DC
  Administered 2020-09-22 – 2020-09-28 (×19): 3.375 g via INTRAVENOUS
  Filled 2020-09-21 (×20): qty 50

## 2020-09-21 MED ORDER — NOREPINEPHRINE 4 MG/250ML-% IV SOLN: 2.0000 ug/min | INTRAVENOUS | Status: DC

## 2020-09-21 MED ORDER — DOCUSATE SODIUM 100 MG PO CAPS: 100.0000 mg | ORAL_CAPSULE | Freq: Two times a day (BID) | ORAL | Status: DC | PRN

## 2020-09-21 NOTE — ED Notes (Signed)
edp aware of bp orders for 2L LR

## 2020-09-21 NOTE — Consult Note (Signed)
Pharmacy Antibiotic Note  Eddie Chapman is a 59 y.o. male admitted on 09/21/2020 with sepsis. PMH includes cerebral aneurysm, PVD, HFrEF, pulmonary HTN, morbid obesity, OSA, GERD, hx of TBI, and DLD. Pt was found unresponsive by girlfriend. Pt UDS positive for cannabinoid and opiates. Pt was found to be severely hypoxemic in circulatory shock. Pharmacy has been consulted for Zosyn dosing.  LA 2.0>2.7, WBC 12.5, afebrile, RR 24, SBP 85-110, Scr 1.17  Plan: Zosyn 3.375g IV q8h (4 hour infusion).  Monitor Scr   Height: 5\' 10"  (177.8 cm) Weight: (!) 145.1 kg (319 lb 14.2 oz) IBW/kg (Calculated) : 73  Temp (24hrs), Avg:98.2 F (36.8 C), Min:97.5 F (36.4 C), Max:98.96 F (37.2 C)  Recent Labs  Lab 09/21/20 1327 09/21/20 1412 09/21/20 1512 09/21/20 1519  WBC 12.5*  --   --   --   CREATININE  --   --   --  1.17  LATICACIDVEN  --  2.0* 2.7*  --     Estimated Creatinine Clearance: 96.7 mL/min (by C-G formula based on SCr of 1.17 mg/dL).    Allergies  Allergen Reactions  . Divalproex Sodium Other (See Comments)    Elevated liver enzymes  . Valproic Acid     unknown    Antimicrobials this admission: 12/23 cefepime x 1 dose 12/23 Zosyn >> 12/23 vanc x 1 dose   Microbiology results: 12/23 BCx: pending 12/23 tracheal aspirate: pending 12/23 MRSA PCR: negative 12/23 resp. Panel: negative   Thank you for allowing pharmacy to be a part of this patient's care.  Benn Moulder, PharmD Pharmacy Resident  09/21/2020 6:14 PM

## 2020-09-21 NOTE — ED Notes (Signed)
Pt itubated by Dr Cherylann Banas at 1310 3mm tube with positive color change  26 at lip

## 2020-09-21 NOTE — Consult Note (Signed)
CODE SEPSIS - PHARMACY COMMUNICATION  **Broad Spectrum Antibiotics should be administered within 1 hour of Sepsis diagnosis**  Time Code Sepsis Called/Page Received: 1417  Antibiotics Ordered: Cefepime/Vancomycin  Time of 1st antibiotic administration: 4008  Additional action taken by pharmacy: none  If necessary, Name of Provider/Nurse Contacted: [place], PharmD, BCPS Clinical Pharmacist 09/21/2020 2:50 PM

## 2020-09-21 NOTE — Sepsis Progress Note (Signed)
Notified bedside nurse of need to draw repeat lactic acid. 

## 2020-09-21 NOTE — Sepsis Progress Note (Signed)
Notified provider of need to order fluid bolus and a third lactic acid.

## 2020-09-21 NOTE — Sepsis Progress Note (Signed)
Sepsis monitoring complete. Lactic acid rising. MD aware and trending procalcitonin.

## 2020-09-21 NOTE — Progress Notes (Signed)
Per Dr. Cherylann Banas the ETT was advanced 2 cm to 27 at the lip.

## 2020-09-21 NOTE — ED Notes (Addendum)
Preparing to intubate: Dr Cherylann Banas at [facility]. Samantha RN working on second IV.  Levada Dy RN adminstering meds 30mg  etomidate given at 1309 120mg  succinylcholine given at 1310

## 2020-09-21 NOTE — ED Triage Notes (Signed)
Pt to ed via ems from home. Pt was found down by his girlfiend. lkw yesterday afternoon when he went to bed. Pt tachycardiac and tachypneac, shallow belly breathing. Incomprehensible sounds, responds to negative stimuli.

## 2020-09-21 NOTE — Progress Notes (Signed)
The pt was transported to CT and back while on the vent.

## 2020-09-21 NOTE — Sepsis Progress Note (Signed)
Notified bedside nurse of need to draw blood cultures.  

## 2020-09-21 NOTE — H&P (Signed)
CRITICAL CARE NOTE    Name: JAYTON POPELKA MRN: [id number] DOB: 1961-01-10     LOS: 0   SUBJECTIVE FINDINGS & SIGNIFICANT EVENTS    Patient description:  59 yo M with hx of cerebral aneurysm, PVD, HFrEF, pulmonary htn, morbid obesity, OSA, GERD, hx of TBI, dyslipidemia who came in after girlfriend found him to be unresponsive.  He was found to be severely hypoxemic in circulatory shock with left lung infiltrate on CXR. Labwork consistent with sepsis.  Lines/tubes :  PIVx2  Microbiology/Sepsis markers: No results found for this or any previous visit.  Anti-infectives:  Anti-infectives (From admission, onward)   Start     Dose/Rate Route Frequency Ordered Stop   09/21/20 1530  vancomycin (VANCOREADY) IVPB 1500 mg/300 mL        1,500 mg 150 mL/hr over 120 Minutes Intravenous  Once 09/21/20 1425     09/21/20 1430  ceFEPIme (MAXIPIME) 2 g in sodium chloride 0.9 % 100 mL IVPB        2 g 200 mL/hr over 30 Minutes Intravenous  Once 09/21/20 1416 09/21/20 1515   09/21/20 1430  vancomycin (VANCOCIN) IVPB 1000 mg/200 mL premix        1,000 mg 200 mL/hr over 60 Minutes Intravenous  Once 09/21/20 1416     09/21/20 1430  vancomycin (VANCOREADY) IVPB 1500 mg/300 mL  Status:  Discontinued        1,500 mg 150 mL/hr over 120 Minutes Intravenous  Once 09/21/20 1425 09/21/20 1425        PAST MEDICAL HISTORY   Past Medical History:  Diagnosis Date  . Adenomatous colon polyp   . Depression   . Ear drum perforation    left x2   . GERD (gastroesophageal reflux disease)   . Hyperlipidemia   . Hypertension   . T8 vertebral fracture [facility])      SURGICAL HISTORY   Past Surgical History:  Procedure Laterality Date  . CARPAL TUNNEL RELEASE     left hand  . COLONOSCOPY  09/18/2009, 09/15/2014  . COLONOSCOPY WITH  PROPOFOL N/A 12/29/2017   Procedure: COLONOSCOPY WITH PROPOFOL;  Surgeon: Manya Silvas, MD;  Location: [facility] ENDOSCOPY;  Service: Endoscopy;  Laterality: N/A;  . EYE SURGERY    . FRACTURE SURGERY    . HERNIA REPAIR    . LIPOMA EXCISION    . SPINE SURGERY       FAMILY HISTORY   Family History  Problem Relation Age of Onset  . Cancer Mother   . Varicose Veins Mother      SOCIAL HISTORY   Social History   Tobacco Use  . Smoking status: Never Smoker  . Smokeless tobacco: Never Used  Vaping Use  . Vaping Use: Never used  Substance Use Topics  . Alcohol use: Never  . Drug use: Never     MEDICATIONS   Current Medication:  Current Facility-Administered Medications:  .  aspirin chewable tablet 324 mg, 324 mg, Oral, NOW **OR** aspirin suppository 300 mg, 300 mg, Rectal, NOW, [name], Soliana Kitko, MD .  docusate sodium (COLACE) capsule 100 mg, 100 mg, Oral, BID PRN, [name], Lequita Meadowcroft, MD .  fentaNYL 2531mg in NS 2562m(1045mml) infusion-PREMIX, 0-400 mcg/hr, Intravenous, Continuous, Siadecki, SebFelix AhmadiD, Last Rate: 7.5 mL/hr at 09/21/20 1510, 75 mcg/hr at 09/21/20 1510 .  heparin injection 5,000 Units, 5,000 Units, Subcutaneous, Q8H, Roshelle Traub, MD .  lactated ringers bolus 1,000 mL, 1,000 mL, Intravenous, Once **AND** [COMPLETED] lactated ringers bolus 1,000  mL, 1,000 mL, Intravenous, Once, Stopped at 09/21/20 1513 **AND** lactated ringers bolus 400 mL, 400 mL, Intravenous, Once, Arta Silence, MD .  midazolam (VERSED) 50 mg/50 mL (1 mg/mL) premix infusion, 0.5-10 mg/hr, Intravenous, Continuous, Siadecki, Felix Ahmadi, MD, Last Rate: 1.5 mL/hr at 09/21/20 1510, 1.5 mg/hr at 09/21/20 1510 .  polyethylene glycol (MIRALAX / GLYCOLAX) packet 17 g, 17 g, Oral, Daily PRN, Ottie Glazier, MD .  vancomycin (VANCOCIN) IVPB 1000 mg/200 mL premix, 1,000 mg, Intravenous, Once, Arta Silence, MD .  vancomycin (VANCOREADY) IVPB 1500 mg/300 mL, 1,500 mg, Intravenous, Once,  Shanlever, Pierce Crane, [facility]  Current Outpatient Medications:  .  amLODipine (NORVASC) 10 MG tablet, Take by mouth., Disp: , Rfl:  .  aspirin EC 81 MG tablet, Take 162 mg by mouth., Disp: , Rfl:  .  Calcium Citrate-Vitamin D3 1000-400 LIQD, Take by mouth., Disp: , Rfl:  .  carisoprodol (SOMA) 350 MG tablet, TAKE ONE TABLET BY MOUTH TWICE DAILY AS NEEDED, Disp: , Rfl:  .  cloNIDine (CATAPRES) 0.1 MG tablet, Take 0.1 mg by mouth., Disp: , Rfl:  .  diazepam (VALIUM) 10 MG tablet, , Disp: , Rfl:  .  diphenhydrAMINE (BENADRYL) 25 mg capsule, Take by mouth., Disp: , Rfl:  .  DULoxetine (CYMBALTA) 30 MG capsule, Take 30 mg by mouth. , Disp: , Rfl:  .  fluticasone (FLONASE) 50 MCG/ACT nasal spray, Place into the nose., Disp: , Rfl:  .  HYDROcodone-acetaminophen (NORCO/VICODIN) 5-325 MG tablet, , Disp: , Rfl:  .  losartan (COZAAR) 50 MG tablet, Take 50 mg by mouth. , Disp: , Rfl:  .  lovastatin (MEVACOR) 40 MG tablet, 40 mg. 2 tablets at bedtime, Disp: , Rfl:  .  meclizine (ANTIVERT) 25 MG tablet, TAKE ONE TABLET BY MOUTH THREE TIMES DAILY AS NEEDED FOR DIZZINESS FOR  UP  TO  10  DAYS, Disp: , Rfl:  .  Omega-3 Fatty Acids (FISH OIL PO), Take by mouth., Disp: , Rfl:  .  omeprazole (PRILOSEC) 20 MG capsule, Take by mouth., Disp: , Rfl:  .  propranolol (INDERAL) 40 MG tablet, Take 20 mg by mouth 2 (two) times daily. , Disp: , Rfl:  .  traZODone (DESYREL) 50 MG tablet, Take by mouth., Disp: , Rfl:  .  vitamin E 1000 UNIT capsule, Take by mouth., Disp: , Rfl:     ALLERGIES   Divalproex sodium and Valproic acid    REVIEW OF SYSTEMS     Unable to obtain due to unresponsive state in critically ill state  PHYSICAL EXAMINATION   Vital Signs: Pulse Rate:  [97-113] 97 (12/23 1500) Resp:  [17-38] 19 (12/23 1500) BP: (56-147)/(36-93) 108/74 (12/23 1500) SpO2:  [89 %-99 %] 92 % (12/23 1500) Weight:  [147.4 kg] 147.4 kg (12/23 1313)  GENERAL:obese age appropirate. HEAD: Normocephalic, atraumatic.   EYES: Pupils equal, round, reactive to light.  No scleral icterus.  MOUTH: Moist mucosal membrane. NECK: Supple. No thyromegaly. No nodules. No JVD.  PULMONARY: decreased L lung breath sounds, rhonchi bilaterally  CARDIOVASCULAR: S1 and S2. Regular rate and rhythm. No murmurs, rubs, or gallops.  GASTROINTESTINAL: Soft, nontender, non-distended. No masses. Positive bowel sounds. No hepatosplenomegaly.  MUSCULOSKELETAL: No swelling, clubbing, or edema.  NEUROLOGIC: Mild distress due to acute illness SKIN:intact,warm,dry   PERTINENT DATA     Infusions: . fentaNYL infusion INTRAVENOUS 75 mcg/hr (09/21/20 1510)  . lactated ringers     And  . lactated ringers    . midazolam 1.5 mg/hr (09/21/20 1510)  .  vancomycin    . vancomycin     Scheduled Medications: . aspirin  324 mg Oral NOW   Or  . aspirin  300 mg Rectal NOW  . heparin  5,000 Units Subcutaneous Q8H   PRN Medications: docusate sodium, polyethylene glycol Hemodynamic parameters:   Intake/Output: No intake/output data recorded.  Ventilator  Settings:      LAB RESULTS:  Basic Metabolic Panel: No results for input(s): NA, K, CL, CO2, GLUCOSE, BUN, CREATININE, CALCIUM, MG, PHOS in the last 168 hours. Liver Function Tests: No results for input(s): AST, ALT, ALKPHOS, BILITOT, PROT, ALBUMIN in the last 168 hours. No results for input(s): LIPASE, AMYLASE in the last 168 hours. No results for input(s): AMMONIA in the last 168 hours. CBC: Recent Labs  Lab 09/21/20 1327  WBC 12.5*  NEUTROABS 10.5*  HGB 19.0*  HCT 62.8*  MCV 100.0  PLT 142*   Cardiac Enzymes: No results for input(s): CKTOTAL, CKMB, CKMBINDEX, TROPONINI in the last 168 hours. BNP: Invalid input(s): POCBNP CBG: No results for input(s): GLUCAP in the last 168 hours.     IMAGING RESULTS:  Imaging: CT Head Wo Contrast  Result Date: 09/21/2020 CLINICAL DATA:  Altered mental status EXAM: CT HEAD WITHOUT CONTRAST TECHNIQUE: Contiguous axial  images were obtained from the base of the skull through the vertex without intravenous contrast. COMPARISON:  CT 12/14/2018 FINDINGS: Brain: No evidence of acute infarction, hemorrhage, hydrocephalus, extra-axial collection or mass lesion/mass effect. Chronic right hemisphere encephalomalacia predominantly involving the right frontal and temporal lobes, not appreciably changed from prior. Ex vacuo dilatation of the occipital horn of the right lateral ventricle. Vascular: Known small anterior splenic failure a additional left the. No hyperdense vessel. Skull: Negative for acute calvarial fracture. Sinuses/Orbits: Lupus retention cyst versus sinonasal polyp in the left maxillary sinus. Scattered bilateral ethmoid mucosal thickening. Orbital structures within normal limits. Other: Negative for scalp hematoma. IMPRESSION: 1. No acute intracranial findings. 2. Chronic right hemispheric encephalomalacia, not appreciably changed from prior CT. Electronically Signed   By: Davina Poke D.O.   On: 09/21/2020 14:12   DG Chest Portable 1 View  Result Date: 09/21/2020 CLINICAL DATA:  Respiratory distress, tachypnea EXAM: PORTABLE CHEST 1 VIEW COMPARISON:  None. FINDINGS: ET tube terminates 3.8 cm above the carina. Heart size is partially obscured but appears enlarged. Atherosclerotic calcification of the aortic knob. Dense left basilar airspace opacity. Small bilateral pleural effusions. No pneumothorax. Possible nondisplaced fractures of the lateral right fourth and fifth ribs. IMPRESSION: 1. ET tube terminates 3.8 cm above the carina. 2. Dense left basilar airspace opacity which may reflect atelectasis versus pneumonia. 3. Small bilateral pleural effusions. 4. Possible nondisplaced fractures of the lateral right fourth and fifth ribs. Electronically Signed   By: Davina Poke D.O.   On: 09/21/2020 14:01   _0 @ CT Head Wo Contrast  Result Date: 09/21/2020 CLINICAL DATA:  Altered mental status EXAM: CT  HEAD WITHOUT CONTRAST TECHNIQUE: Contiguous axial images were obtained from the base of the skull through the vertex without intravenous contrast. COMPARISON:  CT 12/14/2018 FINDINGS: Brain: No evidence of acute infarction, hemorrhage, hydrocephalus, extra-axial collection or mass lesion/mass effect. Chronic right hemisphere encephalomalacia predominantly involving the right frontal and temporal lobes, not appreciably changed from prior. Ex vacuo dilatation of the occipital horn of the right lateral ventricle. Vascular: Known small anterior splenic failure a additional left the. No hyperdense vessel. Skull: Negative for acute calvarial fracture. Sinuses/Orbits: Lupus retention cyst versus sinonasal polyp in the left maxillary sinus. Scattered  bilateral ethmoid mucosal thickening. Orbital structures within normal limits. Other: Negative for scalp hematoma. IMPRESSION: 1. No acute intracranial findings. 2. Chronic right hemispheric encephalomalacia, not appreciably changed from prior CT. Electronically Signed   By: Davina Poke D.O.   On: 09/21/2020 14:12   DG Chest Portable 1 View  Result Date: 09/21/2020 CLINICAL DATA:  Respiratory distress, tachypnea EXAM: PORTABLE CHEST 1 VIEW COMPARISON:  None. FINDINGS: ET tube terminates 3.8 cm above the carina. Heart size is partially obscured but appears enlarged. Atherosclerotic calcification of the aortic knob. Dense left basilar airspace opacity. Small bilateral pleural effusions. No pneumothorax. Possible nondisplaced fractures of the lateral right fourth and fifth ribs. IMPRESSION: 1. ET tube terminates 3.8 cm above the carina. 2. Dense left basilar airspace opacity which may reflect atelectasis versus pneumonia. 3. Small bilateral pleural effusions. 4. Possible nondisplaced fractures of the lateral right fourth and fifth ribs. Electronically Signed   By: Davina Poke D.O.   On: 09/21/2020 14:01         ASSESSMENT AND PLAN    -Multidisciplinary  rounds held today  Acute Hypoxic Respiratory Failure -due to left lung pneumonia -there is possible broken and displaced ribs on right 4/5 , as well as consolidated dense LLL infiltrate- will characterize with CT chest -continue Bronchodilator Therapy -Wean Fio2 and PEEP as tolerated -will perform SAT/SBT when respiratory parameters are met -empiric cefepime /vanco started in ED - will obtain mrsa nasal and procal trend to guide narrowing antimicrobials   Septic shock   - due toleft lung consolidated pneumonia  -use vasopressors to keep MAP>65 -follow ABG and LA -follow up cultures -emperic ABX -consider stress dose steroids    Renal Failure-most likely due to ATN -follow chem 7 -follow UO -continue Foley Catheter-assess need daily     ID -continue IV abx as prescibed -follow up cultures  GI/Nutrition GI PROPHYLAXIS as indicated DIET-->TF's as tolerated Constipation protocol as indicated  ENDO - ICU hypoglycemic\Hyperglycemia protocol -check FSBS per protocol   ELECTROLYTES -follow labs as needed -replace as needed -pharmacy consultation   DVT/GI PRX ordered -SCDs  TRANSFUSIONS AS NEEDED MONITOR FSBS ASSESS the need for LABS as needed   Critical care provider statement:    Critical care time (minutes):  33   Critical care time was exclusive of:  Separately billable procedures and treating other patients   Critical care was necessary to treat or prevent imminent or life-threatening deterioration of the following conditions:  septic shock, acute hypoxemic respiratory failure, left lung consolidated pneumonia   Critical care was time spent personally by me on the following activities:  Development of treatment plan with patient or surrogate, discussions with consultants, evaluation of patient's response to treatment, examination of patient, obtaining history from patient or surrogate, ordering and performing treatments and interventions, ordering and review of  laboratory studies and re-evaluation of patient's condition.  I assumed direction of critical care for this patient from another provider in my specialty: no    This document was prepared using Dragon voice recognition software and may include unintentional dictation errors.    Ottie Glazier, M.D.  Division of [place]

## 2020-09-21 NOTE — Consult Note (Signed)
PHARMACY -  BRIEF ANTIBIOTIC NOTE   Pharmacy has received consult(s) for Vancomycin/Cefepime from an ED provider.  The patient's profile has been reviewed for ht/wt/allergies/indication/available labs.    One time order(s) placed for Cefepime 2g IV x 1 and Vancomycin 1g IV x 1  Will order an additional 1500mg  IV vancomycin to be administered immediately following 1g dose for a total loading dose of 2500mg   Further antibiotics/pharmacy consults should be ordered by admitting physician if indicated.                       Thank you,  Lu Duffel, PharmD, BCPS Clinical Pharmacist 09/21/2020 2:27 PM

## 2020-09-21 NOTE — ED Notes (Signed)
Dr Cherylann Banas made aware of lactic of 2.0

## 2020-09-21 NOTE — ED Notes (Signed)
Sedatives turned up per icu intensivist

## 2020-09-21 NOTE — Procedures (Signed)
Central Venous Catheter Insertion Procedure Note  Eddie Chapman  [id number]  1961/02/20  Date:09/21/20  Time:11:51 PM   Provider Performing:Cordie Beazley D Dewaine Conger   Procedure: Insertion of Non-tunneled Central Venous [phone]) with US guidance ([id number])   Indication(s) Medication administration and Difficult access  Consent Risks of the procedure as well as the alternatives and risks of each were explained to the patient and/or caregiver.  Consent for the procedure was obtained and is signed in the bedside chart  Anesthesia Topical only with 1% lidocaine   Timeout Verified patient identification, verified procedure, site/side was marked, verified correct patient position, special equipment/implants available, medications/allergies/relevant history reviewed, required imaging and test results available.  Sterile Technique Maximal sterile technique including full sterile barrier drape, hand hygiene, sterile gown, sterile gloves, mask, hair covering, sterile ultrasound probe cover (if used).  Procedure Description Area of catheter insertion was cleaned with chlorhexidine and draped in sterile fashion.  With real-time ultrasound guidance a central venous catheter was placed into the right internal jugular vein. Nonpulsatile blood flow and easy flushing noted in all ports.  The catheter was sutured in place and sterile dressing applied.  Complications/Tolerance None; patient tolerated the procedure well. Chest X-ray is ordered to verify placement for internal jugular or subclavian cannulation.   Chest x-ray is not ordered for femoral cannulation.  EBL Minimal  Specimen(s) None    Line secured at the 18 cm mark.  BIOPATCH applied to the insertion site.    Darel Hong, AGACNP-BC [facility] Pager: [phone]

## 2020-09-21 NOTE — ED Notes (Signed)
Oral secretions suctioned

## 2020-09-21 NOTE — ED Provider Notes (Signed)
[facility] Emergency Department Provider Note ____________________________________________   Event Date/Time   First MD Initiated Contact with Patient 09/21/20 1321     (approximate)  I have reviewed the triage vital signs and the nursing notes.   HISTORY  Chief Complaint Altered Mental Status  Level 5 caveat: History present illness limited due to altered mental status and respiratory distress  HPI Eddie Chapman is a 59 y.o. male with PMH as noted below who presents with respiratory distress and altered mental status.  Per EMS, the patient's girlfriend reported that yesterday he was at his baseline.  This morning she found him snoring and unresponsive.  The patient himself is unable to give any history.  Past Medical History:  Diagnosis Date  . Adenomatous colon polyp   . Depression   . Ear drum perforation    left x2   . GERD (gastroesophageal reflux disease)   . Hyperlipidemia   . Hypertension   . T8 vertebral fracture [facility])     Patient Active Problem List   Diagnosis Date Noted  . Septic shock (HCC) 09/21/2020  . Occlusion and stenosis of vertebral artery 08/19/2016  . Aneurysm, cerebral, nonruptured 08/19/2016  . Essential hypertension 08/19/2016  . Hyperlipidemia 08/19/2016    Past Surgical History:  Procedure Laterality Date  . CARPAL TUNNEL RELEASE     left hand  . COLONOSCOPY  09/18/2009, 09/15/2014  . COLONOSCOPY WITH PROPOFOL N/A 12/29/2017   Procedure: COLONOSCOPY WITH PROPOFOL;  Surgeon: Scot Jun, MD;  Location: [facility] ENDOSCOPY;  Service: Endoscopy;  Laterality: N/A;  . EYE SURGERY    . FRACTURE SURGERY    . HERNIA REPAIR    . LIPOMA EXCISION    . SPINE SURGERY      Prior to Admission medications   Medication Sig Start Date End Date Taking? Authorizing Provider  amLODipine (NORVASC) 10 MG tablet Take by mouth. 01/29/16   [provider]  aspirin EC 81 MG tablet Take 162 mg by mouth.    [provider]  cloNIDine (CATAPRES) 0.1 MG tablet Take 0.1 mg by mouth. 08/17/15   [provider]  diazepam (VALIUM) 10 MG tablet  08/01/16   [provider]  DULoxetine (CYMBALTA) 30 MG capsule Take 30 mg by mouth.  08/17/15   [provider]  fluticasone (FLONASE) 50 MCG/ACT nasal spray Place into the nose.    [provider]  HYDROcodone-acetaminophen (NORCO/VICODIN) 5-325 MG tablet  07/31/16   [provider]  losartan (COZAAR) 50 MG tablet Take 50 mg by mouth.  08/17/15   [provider]  lovastatin (MEVACOR) 40 MG tablet 40 mg. 2 tablets at bedtime 08/01/16   [provider]  metoprolol succinate (TOPROL-XL) 100 MG 24 hr tablet Take 100 mg by mouth daily. 07/23/20   [provider]  Omega-3 Fatty Acids (FISH OIL) 1000 MG CAPS Take 1 capsule by mouth daily.    [provider]  omeprazole (PRILOSEC) 20 MG capsule Take by mouth.    [provider]  propranolol (INDERAL) 40 MG tablet Take 20 mg by mouth 2 (two) times daily.  08/17/15   [provider]  spironolactone (ALDACTONE) 25 MG tablet Take 25 mg by mouth daily. 08/31/20   [provider]  telmisartan (MICARDIS) 20 MG tablet Take 20 mg by mouth daily. 07/07/20   [provider]  traZODone (DESYREL) 50 MG tablet Take by mouth. 11/29/15 12/29/17  [provider]    Allergies  Divalproex sodium and Valproic acid  Family History  Problem Relation Age of Onset  . Cancer Mother   . Varicose Veins Mother     Social History Social History   Tobacco Use  . Smoking status: Never Smoker  . Smokeless tobacco: Never Used  Vaping Use  . Vaping Use: Never used  Substance Use Topics  . Alcohol use: Never  . Drug use: Never    Review of Systems Level 5 caveat: Unable to obtain review of systems due to altered mental status and respiratory distress    ____________________________________________   PHYSICAL EXAM:  VITAL  SIGNS: ED Triage Vitals  Enc Vitals Group     BP 09/21/20 1310 (!) 147/92     Pulse Rate 09/21/20 1310 (!) 113     Resp 09/21/20 1310 (!) 38     Temp --      Temp src --      SpO2 09/21/20 1310 (!) 89 %     Weight 09/21/20 1313 (!) 325 lb (147.4 kg)     Height 09/21/20 1313 5\' 10"  (1.778 m)     Head Circumference --      Peak Flow --      Pain Score --      Pain Loc --      Pain Edu? --      Excl. in GC? --     Constitutional: Somnolent, moans or grunts to painful stimuli. Eyes: Conjunctivae are normal.  EOMI.  PERRLA. Head: Atraumatic. Nose: No congestion/rhinnorhea. Mouth/Throat: Mucous membranes are dry.   Neck: Normal range of motion.  Cardiovascular: Tachycardic, regular rhythm. Grossly normal heart sounds.  Good peripheral circulation. Respiratory: Tachypneic with accessory muscle use.  Diffuse rhonchi bilaterally and somewhat diminished breath sounds on the left. Gastrointestinal: Soft and nontender. No distention.  Genitourinary: Normal external genitalia. Musculoskeletal: No lower extremity edema.  Distal lower extremities appear slightly cyanotic especially on the left.  2+ DP pulses bilaterally. Neurologic: Motor intact in all all extremities.  Otherwise unable to assess due to mental status. Skin:  Skin is warm and dry. No rash noted. Psychiatric: Unable to assess due to mental status. ____________________________________________   LABS (all labs ordered are listed, but only abnormal results are displayed)  Labs Reviewed  ACETAMINOPHEN LEVEL - Abnormal; Notable for the following components:      Result Value   Acetaminophen (Tylenol), Serum <10 (*)    All other components within normal limits  SALICYLATE LEVEL - Abnormal; Notable for the following components:   Salicylate Lvl <7.0 (*)    All other components within normal limits  BRAIN NATRIURETIC PEPTIDE - Abnormal; Notable for the following components:   B Natriuretic Peptide 343.1 (*)    All other  components within normal limits  LACTIC ACID, PLASMA - Abnormal; Notable for the following components:   Lactic Acid, Venous 2.0 (*)    All other components within normal limits  LACTIC ACID, PLASMA - Abnormal; Notable for the following components:   Lactic Acid, Venous 2.7 (*)    All other components within normal limits  CBC WITH DIFFERENTIAL/PLATELET - Abnormal; Notable for the following components:   WBC 12.5 (*)    RBC 6.28 (*)    Hemoglobin 19.0 (*)    HCT 62.8 (*)    Platelets 142 (*)    Neutro Abs 10.5 (*)    Lymphs Abs 0.3 (*)    Monocytes Absolute 1.5 (*)    Abs Immature Granulocytes 0.17 (*)  All other components within normal limits  URINALYSIS, COMPLETE (UACMP) WITH MICROSCOPIC - Abnormal; Notable for the following components:   Color, Urine YELLOW (*)    APPearance HAZY (*)    Hgb urine dipstick MODERATE (*)    Protein, ur >=300 (*)    Bacteria, UA RARE (*)    All other components within normal limits  URINE DRUG SCREEN, QUALITATIVE ([facility] ONLY) - Abnormal; Notable for the following components:   Opiate, Ur Screen POSITIVE (*)    Cannabinoid 50 Ng, Ur [place] POSITIVE (*)    All other components within normal limits  BLOOD GAS, ARTERIAL - Abnormal; Notable for the following components:   pH, Arterial 7.25 (*)    pCO2 arterial 88 (*)    pO2, Arterial 66 (*)    Bicarbonate 38.6 (*)    Acid-Base Excess 6.9 (*)    All other components within normal limits  TROPONIN I (HIGH SENSITIVITY) - Abnormal; Notable for the following components:   Troponin I (High Sensitivity) 53 (*)    All other components within normal limits  RESP PANEL BY RT-PCR (FLU A&B, COVID) ARPGX2  CULTURE, BLOOD (ROUTINE X 2)  CULTURE, BLOOD (ROUTINE X 2)  CULTURE, RESPIRATORY  MRSA PCR SCREENING  ETHANOL  COMPREHENSIVE METABOLIC PANEL  HIV ANTIBODY (ROUTINE TESTING W REFLEX)  TROPONIN I (HIGH SENSITIVITY)   ____________________________________________  EKG  ED ECG REPORT I, Arta Silence, the attending physician, personally viewed and interpreted this ECG.  Date: 09/21/2020 EKG Time: 1314 Rate: 107 Rhythm: sinus tachycardia QRS Axis: normal Intervals: normal ST/T Wave abnormalities: Nonspecific abnormalities Narrative Interpretation: Nonspecific abnormalities with no evidence of acute ischemia  ____________________________________________  RADIOLOGY  CXR interpreted by me shows diffuse left lung opacity.  ET tube is in good position above the carina. CT head: No ICH or other acute abnormality   ____________________________________________   PROCEDURES  Procedure(s) performed: Yes  Procedure Name: Intubation Date/Time: 09/21/2020 4:11 PM Performed by: Arta Silence, MD Pre-anesthesia Checklist: Patient identified, Patient being monitored, Emergency Drugs available, Timeout performed and Suction available Oxygen Delivery Method: Ambu bag Preoxygenation: Pre-oxygenation with 100% oxygen Induction Type: Rapid sequence Ventilation: Mask ventilation without difficulty Laryngoscope Size: Glidescope and 3 Tube size: 8.0 mm Number of attempts: 1 Placement Confirmation: ETT inserted through vocal cords under direct vision,  CO2 detector and Breath sounds checked- equal and bilateral Secured at: 25 cm Tube secured with: ETT holder       Critical Care performed: Yes  CRITICAL CARE Performed by: Arta Silence   Total critical care time: 60 minutes  Critical care time was exclusive of separately billable procedures and treating other patients.  Critical care was necessary to treat or prevent imminent or life-threatening deterioration.  Critical care was time spent personally by me on the following activities: development of treatment plan with patient and/or surrogate as well as nursing, discussions with consultants, evaluation of patient's response to treatment, examination of patient, obtaining history from patient or surrogate,  ordering and performing treatments and interventions, ordering and review of laboratory studies, ordering and review of radiographic studies, pulse oximetry and re-evaluation of patient's condition.  ____________________________________________   INITIAL IMPRESSION / ASSESSMENT AND PLAN / ED COURSE  Pertinent labs & imaging results that were available during my care of the patient were reviewed by me and considered in my medical decision making (see chart for details).  59 year old male with PMH as noted above presents after he was found unresponsive and with sonorous respirations by his girlfriend this  morning.  EMS was not able to get an adequate O2 saturation although the patient was placed on a nonrebreather.  I reviewed the past medical records in [place].  The patient was last here for colonoscopy in 2019 and otherwise has no recent ED visits or admissions.  On initial exam, the patient was somnolent and moaning or grunting to painful stimuli.  He was tachypneic and in respiratory distress.  Initially he was tachycardic with a stable blood pressure.  There was some difficulty obtaining an O2 saturation initially, however when we were able to get a reliable waveform it was 84% on nonrebreather.  The patient had some cyanosis in his distal lower extremities near the toes but good DP pulses and cap refill.  He moved all extremities although neurologic exam was limited by his mental status.  Given the patient's respiratory failure, work of breathing, and altered mental status resulting in inability to protect his airway, he required intubation.  The patient was intubated successfully on the first attempt via glide scope.  Differential includes pneumonia, CHF, COPD, COVID-19, respiratory failure due to other etiology such as intracranial hemorrhage or metabolic derangement.  We will obtain a CT head, chest x-ray, lab work-up, and reassess.  ----------------------------------------- 4:09 PM on  09/21/2020 -----------------------------------------  CT head was negative.  Chest x-ray revealed dense left lung opacities consistent with pneumonia.  Code sepsis was activated and the patient was started on broad-spectrum IV antibiotics.  The patient developed hypotension after returning from CT and fluids were initiated per the sepsis protocol, with good response.  I consulted Dr. [name] from the ICU for admission and he agreed to admit the patient.  ___________________________  Eddie Chapman was evaluated in Emergency Department on 09/21/2020 for the symptoms described in the history of present illness. He was evaluated in the context of the global COVID-19 pandemic, which necessitated consideration that the patient might be at risk for infection with the SARS-CoV-2 virus that causes COVID-19. Institutional protocols and algorithms that pertain to the evaluation of patients at risk for COVID-19 are in a state of rapid change based on information released by regulatory bodies including the CDC and federal and state organizations. These policies and algorithms were followed during the patient's care in the ED. ____________________________________________   FINAL CLINICAL IMPRESSION(S) / ED DIAGNOSES  Final diagnoses:  Acute respiratory failure with hypoxia (HCC)  Altered mental status, unspecified altered mental status type      NEW MEDICATIONS STARTED DURING THIS VISIT:  Current Discharge Medication List       Note:  This document was prepared using Dragon voice recognition software and may include unintentional dictation errors.    Arta Silence, MD 09/21/20 [phone]

## 2020-09-21 NOTE — Progress Notes (Signed)
The pt was transported to CCU while on the vent.

## 2020-09-22 ENCOUNTER — Inpatient Hospital Stay
Admit: 2020-09-22 | Discharge: 2020-09-22 | Disposition: A | Discharge status: Home / Self Care | Payer: PPO | Attending: Pulmonary Disease | Admitting: Pulmonary Disease

## 2020-09-22 ENCOUNTER — Inpatient Hospital Stay: Payer: PPO

## 2020-09-22 DIAGNOSIS — R012 Other cardiac sounds: Secondary | ICD-10-CM

## 2020-09-22 LAB — CBC WITH DIFFERENTIAL/PLATELET
Abs Immature Granulocytes: 0.08 10*3/uL — ABNORMAL HIGH (ref 0.00–0.07)
Basophils Absolute: 0 10*3/uL (ref 0.0–0.1)
Basophils Relative: 0 %
Eosinophils Absolute: 0 10*3/uL (ref 0.0–0.5)
Eosinophils Relative: 0 %
HCT: 48.2 % (ref 39.0–52.0)
Hemoglobin: 14.9 g/dL (ref 13.0–17.0)
Immature Granulocytes: 1 %
Lymphocytes Relative: 6 %
Lymphs Abs: 0.8 10*3/uL (ref 0.7–4.0)
MCH: 30.2 pg (ref 26.0–34.0)
MCHC: 30.9 g/dL (ref 30.0–36.0)
MCV: 97.6 fL (ref 80.0–100.0)
Monocytes Absolute: 2 10*3/uL — ABNORMAL HIGH (ref 0.1–1.0)
Monocytes Relative: 15 %
Neutro Abs: 10.6 10*3/uL — ABNORMAL HIGH (ref 1.7–7.7)
Neutrophils Relative %: 78 %
Platelets: 126 10*3/uL — ABNORMAL LOW (ref 150–400)
RBC: 4.94 MIL/uL (ref 4.22–5.81)
RDW: 12.8 % (ref 11.5–15.5)
WBC: 13.5 10*3/uL — ABNORMAL HIGH (ref 4.0–10.5)
nRBC: 0 % (ref 0.0–0.2)

## 2020-09-22 LAB — ECHOCARDIOGRAM COMPLETE
AR max vel: 2.55 cm2
AV Area VTI: 2.88 cm2
AV Area mean vel: 2.67 cm2
AV Mean grad: 5 mmHg
AV Peak grad: 8.4 mmHg
Ao pk vel: 1.45 m/s
Area-P 1/2: 2.53 cm2
Height: 70 in
S' Lateral: 3.86 cm
Weight: 5114.67 oz

## 2020-09-22 LAB — BASIC METABOLIC PANEL
Anion gap: 7 (ref 5–15)
BUN: 20 mg/dL (ref 6–20)
CO2: 36 mmol/L — ABNORMAL HIGH (ref 22–32)
Calcium: 8.1 mg/dL — ABNORMAL LOW (ref 8.9–10.3)
Chloride: 97 mmol/L — ABNORMAL LOW (ref 98–111)
Creatinine, Ser: 1.16 mg/dL (ref 0.61–1.24)
GFR, Estimated: >60 mL/min (ref >60)
Glucose, Bld: 93 mg/dL (ref 70–99)
Potassium: 4.8 mmol/L (ref 3.5–5.1)
Sodium: 140 mmol/L (ref 135–145)

## 2020-09-22 LAB — HIV ANTIBODY (ROUTINE TESTING W REFLEX): HIV Screen 4th Generation wRfx: NONREACTIVE

## 2020-09-22 LAB — PROCALCITONIN: Procalcitonin: 0.13 ng/mL

## 2020-09-22 LAB — GLUCOSE, CAPILLARY: Glucose-Capillary: 95 mg/dL (ref 70–99)

## 2020-09-22 MED ORDER — SUCRALFATE 1 GM/10ML PO SUSP
1.0000 g | Freq: Three times a day (TID) | ORAL | Status: DC
  Administered 2020-09-22 – 2020-10-03 (×44): 1 g
  Filled 2020-09-22 (×51): qty 10

## 2020-09-22 MED ORDER — ORAL CARE MOUTH RINSE
15.0000 mL | OROMUCOSAL | Status: DC
  Administered 2020-09-22 – 2020-09-24 (×21): 15 mL via OROMUCOSAL

## 2020-09-22 MED ORDER — PANTOPRAZOLE SODIUM 40 MG IV SOLR
40.0000 mg | Freq: Every day | INTRAVENOUS | Status: DC
  Administered 2020-09-22: 40 mg via INTRAVENOUS
  Filled 2020-09-22: qty 40

## 2020-09-22 MED ORDER — LORAZEPAM 2 MG/ML IJ SOLN
2.0000 mg | INTRAMUSCULAR | Status: DC | PRN
  Administered 2020-09-22 – 2020-10-10 (×5): 2 mg via INTRAVENOUS
  Filled 2020-09-22 (×5): qty 1

## 2020-09-22 MED ORDER — PERFLUTREN LIPID MICROSPHERE
1.0000 mL | INTRAVENOUS | Status: AC | PRN
  Administered 2020-09-22: 3 mL via INTRAVENOUS
  Filled 2020-09-22: qty 10

## 2020-09-22 MED ORDER — CHLORHEXIDINE GLUCONATE CLOTH 2 % EX PADS
6.0000 | MEDICATED_PAD | Freq: Every day | CUTANEOUS | Status: DC
  Administered 2020-09-22 – 2020-10-14 (×23): 6 via TOPICAL

## 2020-09-22 MED ORDER — PROSOURCE TF PO LIQD
90.0000 mL | Freq: Every day | ORAL | Status: DC
  Administered 2020-09-22 – 2020-09-24 (×10): 90 mL
  Filled 2020-09-22: qty 90

## 2020-09-22 MED ORDER — PIVOT 1.5 CAL PO LIQD
1000.0000 mL | ORAL | Status: DC
  Administered 2020-09-22 – 2020-09-25 (×4): 1000 mL
  Filled 2020-09-22: qty 1000

## 2020-09-22 MED ORDER — ORAL CARE MOUTH RINSE: 15.0000 mL | OROMUCOSAL | Status: DC

## 2020-09-22 MED ORDER — NOREPINEPHRINE 16 MG/250ML-% IV SOLN
0.0000 ug/min | INTRAVENOUS | Status: DC
  Filled 2020-09-22: qty 250

## 2020-09-22 NOTE — Progress Notes (Signed)
CRITICAL CARE NOTE    Name: Eddie Chapman MRN: [id number] DOB: April 14, 1961     LOS: 1   SUBJECTIVE FINDINGS & SIGNIFICANT EVENTS    Patient description:  59 yo M with hx of cerebral aneurysm, PVD, HFrEF, pulmonary htn, morbid obesity, OSA, GERD, hx of TBI, dyslipidemia who came in after girlfriend found him to be unresponsive.  He was found to be severely hypoxemic in circulatory shock with left lung infiltrate on CXR. Labwork consistent with sepsis.   Events:  09/22/20- patient is off vasopressors.  He is weaning on FIO2.  Family at bedside (girlfriend).  Patient is weaned to 40% FIO2.   Lines/tubes :  PIVx2  Microbiology/Sepsis markers: Results for orders placed or performed during the hospital encounter of 09/21/20  Resp Panel by RT-PCR (Flu A&B, Covid) Nasopharyngeal Swab     Status: None   Collection Time: 09/21/20  2:12 PM   Specimen: Nasopharyngeal Swab; Nasopharyngeal(NP) swabs in vial transport medium  Result Value Ref Range Status   SARS Coronavirus 2 by RT PCR NEGATIVE NEGATIVE Final    Comment: (NOTE) SARS-CoV-2 target nucleic acids are NOT DETECTED.  The SARS-CoV-2 RNA is generally detectable in upper respiratory specimens during the acute phase of infection. The lowest concentration of SARS-CoV-2 viral copies this assay can detect is 138 copies/mL. A negative result does not preclude SARS-Cov-2 infection and should not be used as the sole basis for treatment or other patient management decisions. A negative result may occur with  improper specimen collection/handling, submission of specimen other than nasopharyngeal swab, presence of viral mutation(s) within the areas targeted by this assay, and inadequate number of viral copies(<138 copies/mL). A negative result must be combined  with clinical observations, patient history, and epidemiological information. The expected result is Negative.  Fact Sheet for Patients:  EntrepreneurPulse.com.au  Fact Sheet for Healthcare Providers:  IncredibleEmployment.be  This test is no t yet approved or cleared by the [place] FDA and  has been authorized for detection and/or diagnosis of SARS-CoV-2 by FDA under an Emergency Use Authorization (EUA). This EUA will remain  in effect (meaning this test can be used) for the duration of the COVID-19 declaration under Section 564(b)(1) of the Act, 21 U.S.C.section 360bbb-3(b)(1), unless the authorization is terminated  or revoked sooner.       Influenza A by PCR NEGATIVE NEGATIVE Final   Influenza B by PCR NEGATIVE NEGATIVE Final    Comment: (NOTE) The Xpert Xpress SARS-CoV-2/FLU/RSV plus assay is intended as an aid in the diagnosis of influenza from Nasopharyngeal swab specimens and should not be used as a sole basis for treatment. Nasal washings and aspirates are unacceptable for Xpert Xpress SARS-CoV-2/FLU/RSV testing.  Fact Sheet for Patients: EntrepreneurPulse.com.au  Fact Sheet for Healthcare Providers: IncredibleEmployment.be  This test is not yet approved or cleared by the [place] FDA and has been authorized for detection and/or diagnosis of SARS-CoV-2 by FDA under an Emergency Use Authorization (EUA). This EUA will remain in effect (meaning this test can be used) for the duration of the COVID-19 declaration under Section 564(b)(1) of the Act, 21 U.S.C. section 360bbb-3(b)(1), unless the authorization is terminated or revoked.  Performed at [facility], [address] [place], [place] [address]   Culture, blood (routine x 2)     Status: None (Preliminary result)   Collection Time: 09/21/20  2:45 PM   Specimen: BLOOD  Result Value Ref Range Status   Specimen  Description BLOOD BLOOD RIGHT FOREARM  Final   Special Requests   Final    BOTTLES DRAWN AEROBIC AND ANAEROBIC Blood Culture results may not be optimal due to an inadequate volume of blood received in culture bottles   Culture   Final    NO GROWTH < 24 HOURS Performed at [facility], [address], [place], [place] [address]    Report Status PENDING  Incomplete  Culture, blood (routine x 2)     Status: None (Preliminary result)   Collection Time: 09/21/20  2:45 PM   Specimen: BLOOD  Result Value Ref Range Status   Specimen Description BLOOD RIGHT ANTECUBITAL  Final   Special Requests   Final    BOTTLES DRAWN AEROBIC AND ANAEROBIC Blood Culture results may not be optimal due to an excessive volume of blood received in culture bottles   Culture   Final    NO GROWTH < 24 HOURS Performed at [facility], [address] [place], [place] [address]    Report Status PENDING  Incomplete  Culture, respiratory (non-expectorated)     Status: None (Preliminary result)   Collection Time: 09/21/20  3:14 PM   Specimen: Tracheal Aspirate; Respiratory  Result Value Ref Range Status   Specimen Description   Final    TRACHEAL ASPIRATE Performed at [facility], [address] [place], [place] [address]    Special Requests   Final    NONE Performed at [facility], [address], [place], [place] [address]    Gram Stain PENDING  Incomplete   Culture   Final    TOO YOUNG TO READ Performed at [facility], [place] [address], [place], [place] [address]    Report Status PENDING  Incomplete  MRSA PCR Screening     Status: None   Collection Time: 09/21/20  4:15 PM   Specimen: Nasal Mucosa; Nasopharyngeal  Result Value Ref Range Status   MRSA by PCR NEGATIVE NEGATIVE Final    Comment:        The GeneXpert MRSA Assay (FDA approved for NASAL specimens only), is one component of a comprehensive MRSA colonization surveillance program. It is  not intended to diagnose MRSA infection nor to guide or monitor treatment for MRSA infections. Performed at [facility], [address] [place],  [address]     Anti-infectives:  Anti-infectives (From admission, onward)   Start     Dose/Rate Route Frequency Ordered Stop   09/21/20 1845  piperacillin-tazobactam (ZOSYN) IVPB 3.375 g        3.375 g 12.5 mL/hr over 240 Minutes Intravenous Every 8 hours 09/21/20 1805     09/21/20 1530  vancomycin (VANCOREADY) IVPB 1500 mg/300 mL        1,500 mg 150 mL/hr over 120 Minutes Intravenous  Once 09/21/20 1425 09/21/20 2051   09/21/20 1430  ceFEPIme (MAXIPIME) 2 g in sodium chloride 0.9 % 100 mL IVPB        2 g 200 mL/hr over 30 Minutes Intravenous  Once 09/21/20 1416 09/21/20 1606   09/21/20 1430  vancomycin (VANCOCIN) IVPB 1000 mg/200 mL premix        1,000 mg 200 mL/hr over 60 Minutes Intravenous  Once 09/21/20 1416 09/21/20 1714   09/21/20 1430  vancomycin (VANCOREADY) IVPB 1500 mg/300 mL  Status:  Discontinued        1,500 mg 150 mL/hr over 120 Minutes Intravenous  Once 09/21/20 1425 09/21/20 1425        PAST MEDICAL HISTORY  Past Medical History:  Diagnosis Date  . Adenomatous colon polyp   . Depression   . Ear drum perforation    left x2   . GERD (gastroesophageal reflux disease)   . Hyperlipidemia   . Hypertension   . T8 vertebral fracture [facility])      SURGICAL HISTORY   Past Surgical History:  Procedure Laterality Date  . CARPAL TUNNEL RELEASE     left hand  . COLONOSCOPY  09/18/2009, 09/15/2014  . COLONOSCOPY WITH PROPOFOL N/A 12/29/2017   Procedure: COLONOSCOPY WITH PROPOFOL;  Surgeon: Manya Silvas, MD;  Location: [facility] ENDOSCOPY;  Service: Endoscopy;  Laterality: N/A;  . EYE SURGERY    . FRACTURE SURGERY    . HERNIA REPAIR    . LIPOMA EXCISION    . SPINE SURGERY       FAMILY HISTORY   Family History  Problem Relation Age of Onset  . Cancer Mother   . Varicose Veins Mother       SOCIAL HISTORY   Social History   Tobacco Use  . Smoking status: Never Smoker  . Smokeless tobacco: Never Used  Vaping Use  . Vaping Use: Never used  Substance Use Topics  . Alcohol use: Never  . Drug use: Never     MEDICATIONS   Current Medication:  Current Facility-Administered Medications:  .  0.9 %  sodium chloride infusion, 250 mL, Intravenous, Continuous, Darel Hong D, NP .  chlorhexidine (PERIDEX) 0.12 % solution 15 mL, 15 mL, Mouth Rinse, BID, [name], Majestic Molony, MD, 15 mL at 09/22/20 1008 .  Chlorhexidine Gluconate Cloth 2 % PADS 6 each, 6 each, Topical, Daily, Darel Hong D, NP .  dexmedetomidine (PRECEDEX) 400 MCG/100ML (4 mcg/mL) infusion, 0.4-1.2 mcg/kg/hr, Intravenous, Titrated, Darel Hong D, NP .  docusate sodium (COLACE) capsule 100 mg, 100 mg, Oral, BID PRN, Ottie Glazier, MD .  fentaNYL 2569mg in NS 2568m(1046mml) infusion-PREMIX, 0-400 mcg/hr, Intravenous, Continuous, Siadecki, SebFelix AhmadiD, Last Rate: 40 mL/hr at 09/22/20 0717, 400 mcg/hr at 09/22/20 0717 .  heparin injection 5,000 Units, 5,000 Units, Subcutaneous, Q8H, AleOttie GlazierD, 5,000 Units at 09/22/20 0608 .  [COMPLETED] lactated ringers bolus 1,000 mL, 1,000 mL, Intravenous, Once, Last Rate: 2,000 mL/hr at 09/21/20 1525, 1,000 mL at 09/21/20 1525 **AND** [COMPLETED] lactated ringers bolus 1,000 mL, 1,000 mL, Intravenous, Once, Stopped at 09/21/20 1513 **AND** lactated ringers bolus 400 mL, 400 mL, Intravenous, Once, SiaArta SilenceD .  lactated ringers infusion, , Intravenous, Continuous, AleOttie GlazierD, Last Rate: 50 mL/hr at 09/21/20 2200, Infusion Verify at 09/21/20 2200 .  MEDLINE mouth rinse, 15 mL, Mouth Rinse, q12n4p, Amere Iott, MD, 15 mL at 09/21/20 2139 .  midazolam (VERSED) 50 mg/50 mL (1 mg/mL) premix infusion, 0.5-10 mg/hr, Intravenous, Continuous, Siadecki, SebFelix AhmadiD, Last Rate: 6 mL/hr at 09/22/20 0938, 6 mg/hr at 09/22/20 0938 .   norepinephrine (LEVOPHED) 16 mg in 250m56memix infusion, 0-40 mcg/min, Intravenous, Titrated, KeenDarel HongNP .  piperacillin-tazobactam (ZOSYN) IVPB 3.375 g, 3.375 g, Intravenous, Q8H, CunnBenn MoulderH, Last Rate: 12.5 mL/hr at 09/22/20 0608, 3.375 g at 09/22/20 0608 .  polyethylene glycol (MIRALAX / GLYCOLAX) packet 17 g, 17 g, Oral, Daily PRN, AlesOttie Glazier    ALLERGIES   Divalproex sodium and Valproic acid    REVIEW OF SYSTEMS     Unable to obtain due to unresponsive state in critically ill state  PHYSICAL EXAMINATION   Vital Signs: Temp:  [97.5 F (36.4 C)-99.14 F (37.3  C)] 98.8 F (37.1 C) (12/24 0800) Pulse Rate:  [59-113] 59 (12/24 0800) Resp:  [17-38] 24 (12/24 0800) BP: (56-147)/(36-93) 105/71 (12/24 0800) SpO2:  [89 %-100 %] 97 % (12/24 0819) FiO2 (%):  [40 %-50 %] 40 % (12/24 0819) Weight:  [145 kg-147.4 kg] (P) 145 kg (12/24 0500)  GENERAL:obese age appropirate. HEAD: Normocephalic, atraumatic.  EYES: Pupils equal, round, reactive to light.  No scleral icterus.  MOUTH: Moist mucosal membrane. NECK: Supple. No thyromegaly. No nodules. No JVD.  PULMONARY: decreased L lung breath sounds, rhonchi bilaterally  CARDIOVASCULAR: S1 and S2. Regular rate and rhythm. No murmurs, rubs, or gallops.  GASTROINTESTINAL: Soft, nontender, non-distended. No masses. Positive bowel sounds. No hepatosplenomegaly.  MUSCULOSKELETAL: No swelling, clubbing, or edema.  NEUROLOGIC: Mild distress due to acute illness SKIN:intact,warm,dry   PERTINENT DATA     Infusions: . sodium chloride    . dexmedetomidine (PRECEDEX) IV infusion    . fentaNYL infusion INTRAVENOUS 400 mcg/hr (09/22/20 0717)  . lactated ringers    . lactated ringers 50 mL/hr at 09/21/20 2200  . midazolam 6 mg/hr (09/22/20 0938)  . norepinephrine (LEVOPHED) Adult infusion    . piperacillin-tazobactam (ZOSYN)  IV 3.375 g (09/22/20 9024)   Scheduled Medications: . chlorhexidine  15 mL  Mouth Rinse BID  . Chlorhexidine Gluconate Cloth  6 each Topical Daily  . heparin  5,000 Units Subcutaneous Q8H  . mouth rinse  15 mL Mouth Rinse q12n4p   PRN Medications: docusate sodium, polyethylene glycol Hemodynamic parameters:   Intake/Output: 12/23 0701 - 12/24 0700 In: 3533.5 [I.V.:686.7; NG/GT:80; IV Piggyback:2766.9] Out: 1300 [Urine:825; Emesis/NG output:475]  Ventilator  Settings: Vent Mode: PRVC FiO2 (%):  [40 %-50 %] 40 % Set Rate:  [18 bmp-24 bmp] 24 bmp Vt Set:  [500 mL] 500 mL PEEP:  [5 cmH20-8 cmH20] 5 cmH20 Plateau Pressure:  [23 cmH20-26 cmH20] 23 cmH20    LAB RESULTS:  Basic Metabolic Panel: Recent Labs  Lab 09/21/20 1519 09/22/20 0432  NA 143 140  K 5.0 4.8  CL 97* 97*  CO2 33* 36*  GLUCOSE 114* 93  BUN 18 20  CREATININE 1.17 1.16  CALCIUM 8.5* 8.1*   Liver Function Tests: Recent Labs  Lab 09/21/20 1519  AST 33  ALT 33  ALKPHOS 54  BILITOT 0.9  PROT 6.1*  ALBUMIN 3.1*   No results for input(s): LIPASE, AMYLASE in the last 168 hours. No results for input(s): AMMONIA in the last 168 hours. CBC: Recent Labs  Lab 09/21/20 1327 09/22/20 0432  WBC 12.5* 13.5*  NEUTROABS 10.5* 10.6*  HGB 19.0* 14.9  HCT 62.8* 48.2  MCV 100.0 97.6  PLT 142* 126*   Cardiac Enzymes: No results for input(s): CKTOTAL, CKMB, CKMBINDEX, TROPONINI in the last 168 hours. BNP: Invalid input(s): POCBNP CBG: Recent Labs  Lab 09/21/20 1536  GLUCAP 95       IMAGING RESULTS:  Imaging: CT Head Wo Contrast  Result Date: 09/21/2020 CLINICAL DATA:  Altered mental status EXAM: CT HEAD WITHOUT CONTRAST TECHNIQUE: Contiguous axial images were obtained from the base of the skull through the vertex without intravenous contrast. COMPARISON:  CT 12/14/2018 FINDINGS: Brain: No evidence of acute infarction, hemorrhage, hydrocephalus, extra-axial collection or mass lesion/mass effect. Chronic right hemisphere encephalomalacia predominantly involving the right  frontal and temporal lobes, not appreciably changed from prior. Ex vacuo dilatation of the occipital horn of the right lateral ventricle. Vascular: Known small anterior splenic failure a additional left the. No hyperdense vessel. Skull: Negative for  acute calvarial fracture. Sinuses/Orbits: Lupus retention cyst versus sinonasal polyp in the left maxillary sinus. Scattered bilateral ethmoid mucosal thickening. Orbital structures within normal limits. Other: Negative for scalp hematoma. IMPRESSION: 1. No acute intracranial findings. 2. Chronic right hemispheric encephalomalacia, not appreciably changed from prior CT. Electronically Signed   By: Davina Poke D.O.   On: 09/21/2020 14:12   DG Chest Port 1 View  Result Date: 09/22/2020 CLINICAL DATA:  Central line placement. EXAM: PORTABLE CHEST 1 VIEW COMPARISON:  09/21/2020 FINDINGS: Endotracheal tube terminates 3.2 cm above the carina. A new enteric tube courses into the abdomen with tip not imaged. A new right jugular catheter terminates over the upper SVC. The cardiac silhouette remains enlarged. There is persistent asymmetric left basilar airspace opacity with an underlying left pleural effusion. Left basilar aeration is improved from the prior study. No airspace consolidation is evident in the right lung. There is no pneumothorax. IMPRESSION: 1. Interval right jugular catheter placement as above. 2. Improved left basilar lung aeration. Electronically Signed   By: Logan Bores M.D.   On: 09/22/2020 00:20   DG Chest Portable 1 View  Result Date: 09/21/2020 CLINICAL DATA:  Respiratory distress, tachypnea EXAM: PORTABLE CHEST 1 VIEW COMPARISON:  None. FINDINGS: ET tube terminates 3.8 cm above the carina. Heart size is partially obscured but appears enlarged. Atherosclerotic calcification of the aortic knob. Dense left basilar airspace opacity. Small bilateral pleural effusions. No pneumothorax. Possible nondisplaced fractures of the lateral right fourth  and fifth ribs. IMPRESSION: 1. ET tube terminates 3.8 cm above the carina. 2. Dense left basilar airspace opacity which may reflect atelectasis versus pneumonia. 3. Small bilateral pleural effusions. 4. Possible nondisplaced fractures of the lateral right fourth and fifth ribs. Electronically Signed   By: Davina Poke D.O.   On: 09/21/2020 14:01   DG Abd Portable 1V  Result Date: 09/21/2020 CLINICAL DATA:  OG tube placement EXAM: PORTABLE ABDOMEN - 1 VIEW COMPARISON:  Partial comparison to chest radiographs dated 09/21/2020 FINDINGS: Enteric tube terminates in the proximal gastric body. Left lower lobe opacity, likely reflecting combination of atelectasis and pleural effusion, better evaluated on dedicated chest radiograph. IMPRESSION: Enteric tube terminates in the proximal gastric body. Electronically Signed   By: Julian Hy M.D.   On: 09/21/2020 16:40   _0 @ CT Head Wo Contrast  Result Date: 09/21/2020 CLINICAL DATA:  Altered mental status EXAM: CT HEAD WITHOUT CONTRAST TECHNIQUE: Contiguous axial images were obtained from the base of the skull through the vertex without intravenous contrast. COMPARISON:  CT 12/14/2018 FINDINGS: Brain: No evidence of acute infarction, hemorrhage, hydrocephalus, extra-axial collection or mass lesion/mass effect. Chronic right hemisphere encephalomalacia predominantly involving the right frontal and temporal lobes, not appreciably changed from prior. Ex vacuo dilatation of the occipital horn of the right lateral ventricle. Vascular: Known small anterior splenic failure a additional left the. No hyperdense vessel. Skull: Negative for acute calvarial fracture. Sinuses/Orbits: Lupus retention cyst versus sinonasal polyp in the left maxillary sinus. Scattered bilateral ethmoid mucosal thickening. Orbital structures within normal limits. Other: Negative for scalp hematoma. IMPRESSION: 1. No acute intracranial findings. 2. Chronic right hemispheric  encephalomalacia, not appreciably changed from prior CT. Electronically Signed   By: Davina Poke D.O.   On: 09/21/2020 14:12   DG Chest Port 1 View  Result Date: 09/22/2020 CLINICAL DATA:  Central line placement. EXAM: PORTABLE CHEST 1 VIEW COMPARISON:  09/21/2020 FINDINGS: Endotracheal tube terminates 3.2 cm above the carina. A new enteric tube courses into the abdomen  with tip not imaged. A new right jugular catheter terminates over the upper SVC. The cardiac silhouette remains enlarged. There is persistent asymmetric left basilar airspace opacity with an underlying left pleural effusion. Left basilar aeration is improved from the prior study. No airspace consolidation is evident in the right lung. There is no pneumothorax. IMPRESSION: 1. Interval right jugular catheter placement as above. 2. Improved left basilar lung aeration. Electronically Signed   By: Logan Bores M.D.   On: 09/22/2020 00:20   DG Chest Portable 1 View  Result Date: 09/21/2020 CLINICAL DATA:  Respiratory distress, tachypnea EXAM: PORTABLE CHEST 1 VIEW COMPARISON:  None. FINDINGS: ET tube terminates 3.8 cm above the carina. Heart size is partially obscured but appears enlarged. Atherosclerotic calcification of the aortic knob. Dense left basilar airspace opacity. Small bilateral pleural effusions. No pneumothorax. Possible nondisplaced fractures of the lateral right fourth and fifth ribs. IMPRESSION: 1. ET tube terminates 3.8 cm above the carina. 2. Dense left basilar airspace opacity which may reflect atelectasis versus pneumonia. 3. Small bilateral pleural effusions. 4. Possible nondisplaced fractures of the lateral right fourth and fifth ribs. Electronically Signed   By: Davina Poke D.O.   On: 09/21/2020 14:01   DG Abd Portable 1V  Result Date: 09/21/2020 CLINICAL DATA:  OG tube placement EXAM: PORTABLE ABDOMEN - 1 VIEW COMPARISON:  Partial comparison to chest radiographs dated 09/21/2020 FINDINGS: Enteric tube  terminates in the proximal gastric body. Left lower lobe opacity, likely reflecting combination of atelectasis and pleural effusion, better evaluated on dedicated chest radiograph. IMPRESSION: Enteric tube terminates in the proximal gastric body. Electronically Signed   By: Julian Hy M.D.   On: 09/21/2020 16:40         ASSESSMENT AND PLAN    -Multidisciplinary rounds held today  Acute Hypoxic Respiratory Failure -due to left lung pneumonia -there is possible broken and displaced ribs on right 4/5 , as well as consolidated dense LLL infiltrate- will characterize with CT chest -continue Bronchodilator Therapy -Wean Fio2 and PEEP as tolerated -will perform SAT/SBT when respiratory parameters are met -empiric cefepime /vanco started in ED - will obtain mrsa nasal and procal trend to guide narrowing antimicrobials   Septic shock   - due toleft lung consolidated pneumonia  -use vasopressors to keep MAP>65 -follow ABG and LA -follow up cultures -emperic ABX -consider stress dose steroids      Renal Failure-most likely due to ATN -follow chem 7 -follow UO -continue Foley Catheter-assess need daily     ID -continue IV abx as prescibed -follow up cultures  GI/Nutrition GI PROPHYLAXIS as indicated DIET-->TF's as tolerated Constipation protocol as indicated  ENDO - ICU hypoglycemic\Hyperglycemia protocol -check FSBS per protocol   ELECTROLYTES -follow labs as needed -replace as needed -pharmacy consultation   DVT/GI PRX ordered -SCDs  TRANSFUSIONS AS NEEDED MONITOR FSBS ASSESS the need for LABS as needed   Critical care provider statement:    Critical care time (minutes):  33   Critical care time was exclusive of:  Separately billable procedures and treating other patients   Critical care was necessary to treat or prevent imminent or life-threatening deterioration of the following conditions:  septic shock, acute hypoxemic respiratory failure, left  lung consolidated pneumonia   Critical care was time spent personally by me on the following activities:  Development of treatment plan with patient or surrogate, discussions with consultants, evaluation of patient's response to treatment, examination of patient, obtaining history from patient or surrogate, ordering and performing treatments  and interventions, ordering and review of laboratory studies and re-evaluation of patient's condition.  I assumed direction of critical care for this patient from another provider in my specialty: no    This document was prepared using Dragon voice recognition software and may include unintentional dictation errors.    Ottie Glazier, M.D.  Division of [place]

## 2020-09-22 NOTE — Progress Notes (Signed)
Initial Nutrition Assessment  DOCUMENTATION CODES:   Morbid obesity  INTERVENTION:  Initiate Pivot 1.5 Cal at 20 mL/hr (480 mL goal daily volume) + PROSource TF 90 mL 6 times daily per tube. Provides 1200 kcal, 177 grams of protein, 360 mL H2O daily.  Provide MVI daily per tube.  NUTRITION DIAGNOSIS:   Inadequate oral intake related to inability to eat as evidenced by NPO status.  GOAL:   Patient will meet greater than or equal to 90% of their needs  MONITOR:   Vent status,Labs,Weight trends,TF tolerance,I & O's  REASON FOR ASSESSMENT:   Ventilator,Consult Enteral/tube feeding initiation and management  ASSESSMENT:   59 year old male with PMHx of cerebral aneurysm, TBI, HLD, HTN, depression, GERD, HFrEF, OSA admitted with severe hypoxemia, circulatory shock, left lung consolidated PNA.   12/23 intubated  Patient is currently intubated on ventilator support MV: 11.7 L/min Temp (24hrs), Avg:98.8 F (37.1 C), Min:97.5 F (36.4 C), Max:99.14 F (37.3 C)  Medications reviewed and include: Protonix, Carafate 1 gram TID and QHS, fentanyl gtt, Versed gtt, Zosyn.  Labs reviewed: Chloride 97, CO2 36.  I/O: 825 mL UOP yesterday  Limited weight hx to trend in chart. Pt was 109.8 kg on 08/19/2016 and 120.2 kg on 12/29/2017. Documented to be 145.1 kg on 12/23. Current wt may be falsely elevated from edema so will continue to monitor.  Enteral Access: 18 Fr. OGT placed 12/23; terminates in proximal gastric body per abdominal x-ray 12/23  Discussed with RN and on rounds. Per MD okay to initiate trickle feeds today.  NUTRITION - FOCUSED PHYSICAL EXAM:  Flowsheet Row Most Recent Value  Orbital Region No depletion  Upper Arm Region No depletion  Thoracic and Lumbar Region No depletion  Buccal Region Unable to assess  Temple Region No depletion  Clavicle Bone Region No depletion  Clavicle and Acromion Bone Region No depletion  Scapular Bone Region Unable to assess  Dorsal  Hand No depletion  Patellar Region No depletion  Anterior Thigh Region No depletion  Posterior Calf Region No depletion  Edema (RD Assessment) Moderate  Hair Reviewed  Eyes Unable to assess  Mouth Unable to assess  Skin Reviewed  Nails Reviewed     Diet Order:   Diet Order    None     EDUCATION NEEDS:   No education needs have been identified at this time  Skin:  Skin Assessment: Reviewed RN Assessment  Last BM:  Unknown/PTA  Height:   Ht Readings from Last 1 Encounters:  09/21/20 5\' 10"  (1.778 m)   Weight:   Wt Readings from Last 1 Encounters:  09/22/20 (!) (P) 145 kg   Ideal Body Weight:  75.5 kg  BMI:  Body mass index is 45.87 kg/m (pended).  Estimated Nutritional Needs:   Kcal:  1595-2030 (11-14 grams/kg)  Protein:  up to 189 grams (2.5 grams/kg IBW)  Fluid:  2-2.3 L/day  Jacklynn Barnacle, MS, RD, LDN Pager number available on Amion

## 2020-09-22 NOTE — Progress Notes (Signed)
*  PRELIMINARY RESULTS* Echocardiogram 2D Echocardiogram has been performed.  Eddie Chapman Eddie Chapman Eddie Chapman 09/22/2020, 10:59 AM

## 2020-09-23 LAB — CBC WITH DIFFERENTIAL/PLATELET
Abs Immature Granulocytes: 0.09 10*3/uL — ABNORMAL HIGH (ref 0.00–0.07)
Basophils Absolute: 0 10*3/uL (ref 0.0–0.1)
Basophils Relative: 0 %
Eosinophils Absolute: 0 10*3/uL (ref 0.0–0.5)
Eosinophils Relative: 0 %
HCT: 47.5 % (ref 39.0–52.0)
Hemoglobin: 15.1 g/dL (ref 13.0–17.0)
Immature Granulocytes: 1 %
Lymphocytes Relative: 8 %
Lymphs Abs: 0.9 10*3/uL (ref 0.7–4.0)
MCH: 30.3 pg (ref 26.0–34.0)
MCHC: 31.8 g/dL (ref 30.0–36.0)
MCV: 95.4 fL (ref 80.0–100.0)
Monocytes Absolute: 1.5 10*3/uL — ABNORMAL HIGH (ref 0.1–1.0)
Monocytes Relative: 13 %
Neutro Abs: 9.6 10*3/uL — ABNORMAL HIGH (ref 1.7–7.7)
Neutrophils Relative %: 78 %
Platelets: 136 10*3/uL — ABNORMAL LOW (ref 150–400)
RBC: 4.98 MIL/uL (ref 4.22–5.81)
RDW: 13.3 % (ref 11.5–15.5)
WBC: 12.2 10*3/uL — ABNORMAL HIGH (ref 4.0–10.5)
nRBC: 0 % (ref 0.0–0.2)

## 2020-09-23 LAB — BASIC METABOLIC PANEL
Anion gap: 9 (ref 5–15)
BUN: 24 mg/dL — ABNORMAL HIGH (ref 6–20)
CO2: 31 mmol/L (ref 22–32)
Calcium: 8 mg/dL — ABNORMAL LOW (ref 8.9–10.3)
Chloride: 98 mmol/L (ref 98–111)
Creatinine, Ser: 1.21 mg/dL (ref 0.61–1.24)
GFR, Estimated: >60 mL/min (ref >60)
Glucose, Bld: 95 mg/dL (ref 70–99)
Potassium: 3.9 mmol/L (ref 3.5–5.1)
Sodium: 138 mmol/L (ref 135–145)

## 2020-09-23 LAB — MAGNESIUM: Magnesium: 1.8 mg/dL (ref 1.7–2.4)

## 2020-09-23 LAB — GLUCOSE, CAPILLARY
Glucose-Capillary: 104 mg/dL — ABNORMAL HIGH (ref 70–99)
Glucose-Capillary: 107 mg/dL — ABNORMAL HIGH (ref 70–99)

## 2020-09-23 LAB — PROCALCITONIN: Procalcitonin: 0.14 ng/mL

## 2020-09-23 LAB — PHOSPHORUS: Phosphorus: 2.7 mg/dL (ref 2.5–4.6)

## 2020-09-23 MED ORDER — PANTOPRAZOLE SODIUM 40 MG PO PACK
40.0000 mg | PACK | Freq: Every day | ORAL | Status: DC
  Administered 2020-09-24 – 2020-10-07 (×13): 40 mg
  Filled 2020-09-23 (×14): qty 20

## 2020-09-23 MED ORDER — MAGNESIUM SULFATE 2 GM/50ML IV SOLN
2.0000 g | Freq: Once | INTRAVENOUS | Status: AC
  Administered 2020-09-23: 2 g via INTRAVENOUS
  Filled 2020-09-23: qty 50

## 2020-09-23 MED ORDER — IPRATROPIUM-ALBUTEROL 0.5-2.5 (3) MG/3ML IN SOLN
3.0000 mL | Freq: Four times a day (QID) | RESPIRATORY_TRACT | Status: DC
  Administered 2020-09-23 – 2020-10-04 (×41): 3 mL via RESPIRATORY_TRACT
  Filled 2020-09-23 (×39): qty 3

## 2020-09-23 NOTE — Progress Notes (Signed)
CRITICAL CARE NOTE    Name: Eddie Chapman MRN: [id number] DOB: [birth year]     LOS: 2   SUBJECTIVE FINDINGS & SIGNIFICANT EVENTS    Patient description:  59 yo M with hx of cerebral aneurysm, PVD, HFrEF, pulmonary htn, morbid obesity, OSA, GERD, hx of TBI, dyslipidemia who came in after girlfriend found him to be unresponsive.  He was found to be severely hypoxemic in circulatory shock with left lung infiltrate on CXR. Labwork consistent with sepsis.   Events:  09/22/20- patient is off vasopressors.  He is weaning on FIO2.  Family at bedside (girlfriend).  Patient is weaned to 40% FIO2. 09/23/20- patient failed SBT today.   Lines/tubes :  PIVx2  Microbiology/Sepsis markers: Results for orders placed or performed during the hospital encounter of 09/21/20  Resp Panel by RT-PCR (Flu A&B, Covid) Nasopharyngeal Swab     Status: None   Collection Time: 09/21/20  2:12 PM   Specimen: Nasopharyngeal Swab; Nasopharyngeal(NP) swabs in vial transport medium  Result Value Ref Range Status   SARS Coronavirus 2 by RT PCR NEGATIVE NEGATIVE Final    Comment: (NOTE) SARS-CoV-2 target nucleic acids are NOT DETECTED.  The SARS-CoV-2 RNA is generally detectable in upper respiratory specimens during the acute phase of infection. The lowest concentration of SARS-CoV-2 viral copies this assay can detect is 138 copies/mL. A negative result does not preclude SARS-Cov-2 infection and should not be used as the sole basis for treatment or other patient management decisions. A negative result may occur with  improper specimen collection/handling, submission of specimen other than nasopharyngeal swab, presence of viral mutation(s) within the areas targeted by this assay, and inadequate number of viral copies(<138 copies/mL). A  negative result must be combined with clinical observations, patient history, and epidemiological information. The expected result is Negative.  Fact Sheet for Patients:  EntrepreneurPulse.com.au  Fact Sheet for Healthcare Providers:  IncredibleEmployment.be  This test is no t yet approved or cleared by the [place] FDA and  has been authorized for detection and/or diagnosis of SARS-CoV-2 by FDA under an Emergency Use Authorization (EUA). This EUA will remain  in effect (meaning this test can be used) for the duration of the COVID-19 declaration under Section 564(b)(1) of the Act, 21 U.S.C.section 360bbb-3(b)(1), unless the authorization is terminated  or revoked sooner.       Influenza A by PCR NEGATIVE NEGATIVE Final   Influenza B by PCR NEGATIVE NEGATIVE Final    Comment: (NOTE) The Xpert Xpress SARS-CoV-2/FLU/RSV plus assay is intended as an aid in the diagnosis of influenza from Nasopharyngeal swab specimens and should not be used as a sole basis for treatment. Nasal washings and aspirates are unacceptable for Xpert Xpress SARS-CoV-2/FLU/RSV testing.  Fact Sheet for Patients: EntrepreneurPulse.com.au  Fact Sheet for Healthcare Providers: IncredibleEmployment.be  This test is not yet approved or cleared by the [place] FDA and has been authorized for detection and/or diagnosis of SARS-CoV-2 by FDA under an Emergency Use Authorization (EUA). This EUA will remain in effect (meaning this test can be used) for the duration of the COVID-19 declaration under Section 564(b)(1) of the Act, 21 U.S.C. section 360bbb-3(b)(1), unless the authorization is terminated or revoked.  Performed at [facility], [address] [place], [place] [address]   Culture, blood (routine x 2)     Status: None (Preliminary result)   Collection Time: 09/21/20  2:45 PM   Specimen: BLOOD  Result Value Ref  Range Status   Specimen Description  BLOOD BLOOD RIGHT FOREARM  Final   Special Requests   Final    BOTTLES DRAWN AEROBIC AND ANAEROBIC Blood Culture results may not be optimal due to an inadequate volume of blood received in culture bottles   Culture   Final    NO GROWTH 2 DAYS Performed at [facility], [address], [place], [place] [address]    Report Status PENDING  Incomplete  Culture, blood (routine x 2)     Status: None (Preliminary result)   Collection Time: 09/21/20  2:45 PM   Specimen: BLOOD  Result Value Ref Range Status   Specimen Description BLOOD RIGHT ANTECUBITAL  Final   Special Requests   Final    BOTTLES DRAWN AEROBIC AND ANAEROBIC Blood Culture results may not be optimal due to an excessive volume of blood received in culture bottles   Culture   Final    NO GROWTH 2 DAYS Performed at [facility], [address], [place], [place] [address]    Report Status PENDING  Incomplete  Culture, respiratory (non-expectorated)     Status: None (Preliminary result)   Collection Time: 09/21/20  3:14 PM   Specimen: Tracheal Aspirate; Respiratory  Result Value Ref Range Status   Specimen Description   Final    TRACHEAL ASPIRATE Performed at [facility], [address], [place], [place] [address]    Special Requests   Final    NONE Performed at [facility], [address] [place], [place] [address]    Gram Stain   Final    FEW WBC PRESENT,BOTH PMN AND MONONUCLEAR FEW GRAM POSITIVE COCCI IN PAIRS IN CHAINS FEW GRAM NEGATIVE RODS    Culture   Final    FEW Normal respiratory flora-no Staph aureus or Pseudomonas seen Performed at [facility], [place] [address], [place], [place] [address]    Report Status PENDING  Incomplete  MRSA PCR Screening     Status: None   Collection Time: 09/21/20  4:15 PM   Specimen: Nasal Mucosa; Nasopharyngeal  Result Value Ref Range Status   MRSA by PCR NEGATIVE NEGATIVE Final    Comment:         The GeneXpert MRSA Assay (FDA approved for NASAL specimens only), is one component of a comprehensive MRSA colonization surveillance program. It is not intended to diagnose MRSA infection nor to guide or monitor treatment for MRSA infections. Performed at [facility], [address] [place], [place] [address]   Culture, respiratory     Status: None (Preliminary result)   Collection Time: 09/22/20 11:40 AM   Specimen: Tracheal Aspirate; Respiratory  Result Value Ref Range Status   Specimen Description   Final    TRACHEAL ASPIRATE Performed at [facility], [address] [place], [place] [address]    Special Requests   Final    NONE Performed at [facility], [address] [place], [place] [address]    Gram Stain   Final    ABUNDANT WBC PRESENT, PREDOMINANTLY PMN NO ORGANISMS SEEN    Culture   Final    NO GROWTH < 12 HOURS Performed at [facility] [address] [place], [place] [address]    Report Status PENDING  Incomplete    Anti-infectives:  Anti-infectives (From admission, onward)   Start     Dose/Rate Route Frequency Ordered Stop   09/21/20 1845  piperacillin-tazobactam (ZOSYN) IVPB 3.375 g        3.375  g 12.5 mL/hr over 240 Minutes Intravenous Every 8 hours 09/21/20 1805     09/21/20 1530  vancomycin (VANCOREADY) IVPB 1500 mg/300 mL        1,500 mg 150 mL/hr over 120 Minutes Intravenous  Once 09/21/20 1425 09/21/20 2051   09/21/20 1430  ceFEPIme (MAXIPIME) 2 g in sodium chloride 0.9 % 100 mL IVPB        2 g 200 mL/hr over 30 Minutes Intravenous  Once 09/21/20 1416 09/21/20 1606   09/21/20 1430  vancomycin (VANCOCIN) IVPB 1000 mg/200 mL premix        1,000 mg 200 mL/hr over 60 Minutes Intravenous  Once 09/21/20 1416 09/21/20 1714   09/21/20 1430  vancomycin (VANCOREADY) IVPB 1500 mg/300 mL  Status:  Discontinued        1,500 mg 150 mL/hr over 120 Minutes Intravenous  Once 09/21/20 1425 09/21/20 1425         PAST MEDICAL HISTORY   Past Medical History:  Diagnosis Date  . Adenomatous colon polyp   . Depression   . Ear drum perforation    left x2   . GERD (gastroesophageal reflux disease)   . Hyperlipidemia   . Hypertension   . T8 vertebral fracture [facility])      SURGICAL HISTORY   Past Surgical History:  Procedure Laterality Date  . CARPAL TUNNEL RELEASE     left hand  . COLONOSCOPY  09/18/2009, 09/15/2014  . COLONOSCOPY WITH PROPOFOL N/A 12/29/2017   Procedure: COLONOSCOPY WITH PROPOFOL;  Surgeon: Manya Silvas, MD;  Location: [facility] ENDOSCOPY;  Service: Endoscopy;  Laterality: N/A;  . EYE SURGERY    . FRACTURE SURGERY    . HERNIA REPAIR    . LIPOMA EXCISION    . SPINE SURGERY       FAMILY HISTORY   Family History  Problem Relation Age of Onset  . Cancer Mother   . Varicose Veins Mother      SOCIAL HISTORY   Social History   Tobacco Use  . Smoking status: Never Smoker  . Smokeless tobacco: Never Used  Vaping Use  . Vaping Use: Never used  Substance Use Topics  . Alcohol use: Never  . Drug use: Never     MEDICATIONS   Current Medication:  Current Facility-Administered Medications:  .  0.9 %  sodium chloride infusion, 250 mL, Intravenous, Continuous, Darel Hong D, NP .  chlorhexidine (PERIDEX) 0.12 % solution 15 mL, 15 mL, Mouth Rinse, BID, [name], Barbarann Kelly, MD, 15 mL at 09/23/20 0956 .  Chlorhexidine Gluconate Cloth 2 % PADS 6 each, 6 each, Topical, Daily, Bradly Bienenstock, NP, 6 each at 09/22/20 2208 .  docusate sodium (COLACE) capsule 100 mg, 100 mg, Oral, BID PRN, [name], Delonda Coley, MD .  feeding supplement (PIVOT 1.5 CAL) liquid 1,000 mL, 1,000 mL, Per Tube, Q24H, Kathleen Likins, MD, Last Rate: 20 mL/hr at 09/23/20 1153, 1,000 mL at 09/23/20 1153 .  feeding supplement (PROSource TF) liquid 90 mL, 90 mL, Per Tube, 6 X Daily, Anthonee Gelin, MD, 90 mL at 09/23/20 1150 .  fentaNYL 2552mg in NS 2523m(1025mml) infusion-PREMIX, 0-400 mcg/hr,  Intravenous, Continuous, Siadecki, SebFelix AhmadiD, Last Rate: 20 mL/hr at 09/23/20 1144, 200 mcg/hr at 09/23/20 1144 .  heparin injection 5,000 Units, 5,000 Units, Subcutaneous, Q8H, AleOttie GlazierD, 5,000 Units at 09/23/20 0556 .  ipratropium-albuterol (DUONEB) 0.5-2.5 (3) MG/3ML nebulizer solution 3 mL, 3 mL, Nebulization, Q6H, Ezma Rehm, MD .  LORazepam (ATIVAN) injection 2 mg, 2  mg, Intravenous, Q3H PRN, Ottie Glazier, MD, 2 mg at 09/22/20 2224 .  MEDLINE mouth rinse, 15 mL, Mouth Rinse, Q2H, Jasen Hartstein, MD, 15 mL at 09/23/20 1145 .  midazolam (VERSED) 50 mg/50 mL (1 mg/mL) premix infusion, 0.5-10 mg/hr, Intravenous, Continuous, Siadecki, Felix Ahmadi, MD, Last Rate: 4 mL/hr at 09/23/20 1144, 4 mg/hr at 09/23/20 1144 .  pantoprazole sodium (PROTONIX) 40 mg/20 mL oral suspension 40 mg, 40 mg, Per Tube, Daily, [name], RPH .  piperacillin-tazobactam (ZOSYN) IVPB 3.375 g, 3.375 g, Intravenous, Q8H, Benn Moulder, RPH, Last Rate: 12.5 mL/hr at 09/23/20 0557, 3.375 g at 09/23/20 0557 .  polyethylene glycol (MIRALAX / GLYCOLAX) packet 17 g, 17 g, Oral, Daily PRN, [name], Cameran Ahmed, MD .  sucralfate (CARAFATE) 1 GM/10ML suspension 1 g, 1 g, Per Tube, TID WC & HS, [name], Nimah Uphoff, MD, 1 g at 09/23/20 1150    ALLERGIES   Divalproex sodium and Valproic acid    REVIEW OF SYSTEMS     Unable to obtain due to unresponsive state in critically ill state  PHYSICAL EXAMINATION   Vital Signs: Temp:  [98.4 F (36.9 C)-99.32 F (37.4 C)] 98.4 F (36.9 C) (12/25 1300) Pulse Rate:  [54-64] 56 (12/25 1300) Resp:  [20-24] 24 (12/25 1300) BP: (107-141)/(72-92) 127/84 (12/25 1300) SpO2:  [95 %-100 %] 97 % (12/25 1300) FiO2 (%):  [30 %-40 %] 30 % (12/25 1200) Weight:  [145.9 kg] 145.9 kg (12/25 0453)  GENERAL:obese age appropirate. HEAD: Normocephalic, atraumatic.  EYES: Pupils equal, round, reactive to light.  No scleral icterus.  MOUTH: Moist mucosal membrane. NECK:  Supple. No thyromegaly. No nodules. No JVD.  PULMONARY: decreased L lung breath sounds, rhonchi bilaterally  CARDIOVASCULAR: S1 and S2. Regular rate and rhythm. No murmurs, rubs, or gallops.  GASTROINTESTINAL: Soft, nontender, non-distended. No masses. Positive bowel sounds. No hepatosplenomegaly.  MUSCULOSKELETAL: No swelling, clubbing, or edema.  NEUROLOGIC: Mild distress due to acute illness SKIN:intact,warm,dry   PERTINENT DATA     Infusions: . sodium chloride    . fentaNYL infusion INTRAVENOUS 200 mcg/hr (09/23/20 1144)  . midazolam 4 mg/hr (09/23/20 1144)  . piperacillin-tazobactam (ZOSYN)  IV 3.375 g (09/23/20 0557)   Scheduled Medications: . chlorhexidine  15 mL Mouth Rinse BID  . Chlorhexidine Gluconate Cloth  6 each Topical Daily  . feeding supplement (PIVOT 1.5 CAL)  1,000 mL Per Tube Q24H  . feeding supplement (PROSource TF)  90 mL Per Tube 6 X Daily  . heparin  5,000 Units Subcutaneous Q8H  . ipratropium-albuterol  3 mL Nebulization Q6H  . mouth rinse  15 mL Mouth Rinse Q2H  . pantoprazole sodium  40 mg Per Tube Daily  . sucralfate  1 g Per Tube TID WC & HS   PRN Medications: docusate sodium, LORazepam, polyethylene glycol Hemodynamic parameters:   Intake/Output: 12/24 0701 - 12/25 0700 In: 1462.3 [I.V.:980.7; NG/GT:331.7; IV Piggyback:149.9] Out: 800 [Urine:800]  Ventilator  Settings: Vent Mode: PRVC FiO2 (%):  [30 %-40 %] 30 % Set Rate:  [24 bmp] 24 bmp Vt Set:  [500 mL] 500 mL PEEP:  [10 cmH20] 10 cmH20 Plateau Pressure:  [22 cmH20-23 cmH20] 22 cmH20    LAB RESULTS:  Basic Metabolic Panel: Recent Labs  Lab 09/21/20 1519 09/22/20 0432 09/23/20 0456  NA 143 140 138  K 5.0 4.8 3.9  CL 97* 97* 98  CO2 33* 36* 31  GLUCOSE 114* 93 95  BUN 18 20 24*  CREATININE 1.17 1.16 1.21  CALCIUM 8.5* 8.1*  8.0*  MG  --   --  1.8  PHOS  --   --  2.7   Liver Function Tests: Recent Labs  Lab 09/21/20 1519  AST 33  ALT 33  ALKPHOS 54  BILITOT 0.9   PROT 6.1*  ALBUMIN 3.1*   No results for input(s): LIPASE, AMYLASE in the last 168 hours. No results for input(s): AMMONIA in the last 168 hours. CBC: Recent Labs  Lab 09/21/20 1327 09/22/20 0432 09/23/20 0456  WBC 12.5* 13.5* 12.2*  NEUTROABS 10.5* 10.6* 9.6*  HGB 19.0* 14.9 15.1  HCT 62.8* 48.2 47.5  MCV 100.0 97.6 95.4  PLT 142* 126* 136*   Cardiac Enzymes: No results for input(s): CKTOTAL, CKMB, CKMBINDEX, TROPONINI in the last 168 hours. BNP: Invalid input(s): POCBNP CBG: Recent Labs  Lab 09/21/20 1536 09/23/20 0349  GLUCAP 95 104*       IMAGING RESULTS:  Imaging: CT Head Wo Contrast  Result Date: 09/21/2020 CLINICAL DATA:  Altered mental status EXAM: CT HEAD WITHOUT CONTRAST TECHNIQUE: Contiguous axial images were obtained from the base of the skull through the vertex without intravenous contrast. COMPARISON:  CT 12/14/2018 FINDINGS: Brain: No evidence of acute infarction, hemorrhage, hydrocephalus, extra-axial collection or mass lesion/mass effect. Chronic right hemisphere encephalomalacia predominantly involving the right frontal and temporal lobes, not appreciably changed from prior. Ex vacuo dilatation of the occipital horn of the right lateral ventricle. Vascular: Known small anterior splenic failure a additional left the. No hyperdense vessel. Skull: Negative for acute calvarial fracture. Sinuses/Orbits: Lupus retention cyst versus sinonasal polyp in the left maxillary sinus. Scattered bilateral ethmoid mucosal thickening. Orbital structures within normal limits. Other: Negative for scalp hematoma. IMPRESSION: 1. No acute intracranial findings. 2. Chronic right hemispheric encephalomalacia, not appreciably changed from prior CT. Electronically Signed   By: Davina Poke D.O.   On: 09/21/2020 14:12   DG Chest Port 1 View  Result Date: 09/22/2020 CLINICAL DATA:  Central line placement. EXAM: PORTABLE CHEST 1 VIEW COMPARISON:  09/21/2020 FINDINGS:  Endotracheal tube terminates 3.2 cm above the carina. A new enteric tube courses into the abdomen with tip not imaged. A new right jugular catheter terminates over the upper SVC. The cardiac silhouette remains enlarged. There is persistent asymmetric left basilar airspace opacity with an underlying left pleural effusion. Left basilar aeration is improved from the prior study. No airspace consolidation is evident in the right lung. There is no pneumothorax. IMPRESSION: 1. Interval right jugular catheter placement as above. 2. Improved left basilar lung aeration. Electronically Signed   By: Logan Bores M.D.   On: 09/22/2020 00:20   DG Chest Portable 1 View  Result Date: 09/21/2020 CLINICAL DATA:  Respiratory distress, tachypnea EXAM: PORTABLE CHEST 1 VIEW COMPARISON:  None. FINDINGS: ET tube terminates 3.8 cm above the carina. Heart size is partially obscured but appears enlarged. Atherosclerotic calcification of the aortic knob. Dense left basilar airspace opacity. Small bilateral pleural effusions. No pneumothorax. Possible nondisplaced fractures of the lateral right fourth and fifth ribs. IMPRESSION: 1. ET tube terminates 3.8 cm above the carina. 2. Dense left basilar airspace opacity which may reflect atelectasis versus pneumonia. 3. Small bilateral pleural effusions. 4. Possible nondisplaced fractures of the lateral right fourth and fifth ribs. Electronically Signed   By: Davina Poke D.O.   On: 09/21/2020 14:01   DG Abd Portable 1V  Result Date: 09/21/2020 CLINICAL DATA:  OG tube placement EXAM: PORTABLE ABDOMEN - 1 VIEW COMPARISON:  Partial comparison to chest radiographs dated 09/21/2020  FINDINGS: Enteric tube terminates in the proximal gastric body. Left lower lobe opacity, likely reflecting combination of atelectasis and pleural effusion, better evaluated on dedicated chest radiograph. IMPRESSION: Enteric tube terminates in the proximal gastric body. Electronically Signed   By: Julian Hy M.D.   On: 09/21/2020 16:40   ECHOCARDIOGRAM COMPLETE  Result Date: 09/22/2020    ECHOCARDIOGRAM REPORT   Patient Name:   JAMAREE HOSIER Date of Exam: 09/22/2020 Medical Rec #:  [id number]     Height:       70.0 in Accession #:    [id number]    Weight:       319.9 lb Date of Birth:  [birth year]      BSA:          2.548 m Patient Age:    28 years      BP:           103/68 mmHg Patient Gender: M             HR:           66 bpm. Exam Location:  [facility] Procedure: 2D Echo, Color Doppler, Cardiac Doppler and Intracardiac            Opacification Agent Indications:     R01.2 Other cardiac sounds  History:         Patient has no prior history of Echocardiogram examinations.                  Risk Factors:Hypertension and Dyslipidemia.  Sonographer:     Charmayne Sheer RDCS (AE) Referring Phys:  [id number] Ottie Glazier Diagnosing Phys: Serafina Royals MD  Sonographer Comments: Echo performed with patient supine and on artificial respirator and Technically difficult study due to poor echo windows. Image acquisition challenging due to patient body habitus. IMPRESSIONS  1. Left ventricular ejection fraction, by estimation, is 50 to 55%. The left ventricle has low normal function. The left ventricle has no regional wall motion abnormalities. Left ventricular diastolic parameters were normal.  2. Right ventricular systolic function is normal. The right ventricular size is normal.  3. The mitral valve is normal in structure. Trivial mitral valve regurgitation.  4. The aortic valve is normal in structure. Aortic valve regurgitation is not visualized. FINDINGS  Left Ventricle: Left ventricular ejection fraction, by estimation, is 50 to 55%. The left ventricle has low normal function. The left ventricle has no regional wall motion abnormalities. Definity contrast agent was given IV to delineate the left ventricular endocardial borders. The left ventricular internal cavity size was normal in size. There is no left ventricular  hypertrophy. Left ventricular diastolic parameters were normal. Right Ventricle: The right ventricular size is normal. No increase in right ventricular wall thickness. Right ventricular systolic function is normal. Left Atrium: Left atrial size was normal in size. Right Atrium: Right atrial size was normal in size. Pericardium: There is no evidence of pericardial effusion. Mitral Valve: The mitral valve is normal in structure. Trivial mitral valve regurgitation. MV peak gradient, 3.1 mmHg. The mean mitral valve gradient is 1.0 mmHg. Tricuspid Valve: The tricuspid valve is normal in structure. Tricuspid valve regurgitation is trivial. Aortic Valve: The aortic valve is normal in structure. Aortic valve regurgitation is not visualized. Aortic valve mean gradient measures 5.0 mmHg. Aortic valve peak gradient measures 8.4 mmHg. Aortic valve area, by VTI measures 2.88 cm. Pulmonic Valve: The pulmonic valve was normal in structure. Pulmonic valve regurgitation is not visualized. Aorta: The aortic root and ascending  aorta are structurally normal, with no evidence of dilitation. IAS/Shunts: No atrial level shunt detected by color flow Doppler.  LEFT VENTRICLE PLAX 2D LVIDd:         4.90 cm  Diastology LVIDs:         3.86 cm  LV e' medial:    3.59 cm/s LV PW:         1.61 cm  LV E/e' medial:  16.8 LV IVS:        1.16 cm  LV e' lateral:   5.00 cm/s LVOT diam:     2.20 cm  LV E/e' lateral: 12.1 LV SV:         75 LV SV Index:   29 LVOT Area:     3.80 cm  RIGHT VENTRICLE RV Basal diam:  3.29 cm RV S prime:     11.70 cm/s LEFT ATRIUM           Index       RIGHT ATRIUM           Index LA diam:      4.10 cm 1.61 cm/m  RA Area:     19.40 cm LA Vol (A4C): 56.2 ml 22.05 ml/m RA Volume:   58.70 ml  23.03 ml/m  AORTIC VALVE                    PULMONIC VALVE AV Area (Vmax):    2.55 cm     PV Vmax:       1.06 m/s AV Area (Vmean):   2.67 cm     PV Vmean:      68.200 cm/s AV Area (VTI):     2.88 cm     PV VTI:        0.210 m AV  Vmax:           145.00 cm/s  PV Peak grad:  4.5 mmHg AV Vmean:          100.000 cm/s PV Mean grad:  2.0 mmHg AV VTI:            0.260 m AV Peak Grad:      8.4 mmHg AV Mean Grad:      5.0 mmHg LVOT Vmax:         97.30 cm/s LVOT Vmean:        70.200 cm/s LVOT VTI:          0.197 m LVOT/AV VTI ratio: 0.76  AORTA Ao Root diam: 4.10 cm MITRAL VALVE MV Area (PHT): 2.53 cm    SHUNTS MV Peak grad:  3.1 mmHg    Systemic VTI:  0.20 m MV Mean grad:  1.0 mmHg    Systemic Diam: 2.20 cm MV Vmax:       0.88 m/s MV Vmean:      41.4 cm/s MV Decel Time: 300 msec MV E velocity: 60.40 cm/s MV A velocity: 78.00 cm/s MV E/A ratio:  0.77 Serafina Royals MD Electronically signed by Serafina Royals MD Signature Date/Time: 09/22/2020/2:49:51 PM    Final    '@PROBHOSP' @ No results found.       ASSESSMENT AND PLAN    -Multidisciplinary rounds held today  Acute Hypoxic Respiratory Failure -due to left lung pneumonia -there is possible broken and displaced ribs on right 4/5 , as well as consolidated dense LLL infiltrate- will characterize with CT chest -continue Bronchodilator Therapy -Wean Fio2 and PEEP as tolerated -will perform SAT/SBT when respiratory parameters are met -empiric cefepime /  vanco started in ED - will obtain mrsa nasal and procal trend to guide narrowing antimicrobials -tracheal aspirate -+ polymicrobial growth   Septic shock   - due toleft lung consolidated pneumonia  -12/25- shock physiology resolved   Renal Failure-acute stage 2 -most likely due to ATN -follow chem 7 -follow UO -continue Foley Catheter-assess need daily   ID -continue IV abx as prescibed -follow up cultures  GI/Nutrition GI PROPHYLAXIS as indicated DIET-->TF's as tolerated Constipation protocol as indicated  ENDO - ICU hypoglycemic\Hyperglycemia protocol -check FSBS per protocol   ELECTROLYTES -follow labs as needed -replace as needed -pharmacy consultation   DVT/GI PRX ordered -SCDs  TRANSFUSIONS AS  NEEDED MONITOR FSBS ASSESS the need for LABS as needed   Critical care provider statement:    Critical care time (minutes):  33   Critical care time was exclusive of:  Separately billable procedures and treating other patients   Critical care was necessary to treat or prevent imminent or life-threatening deterioration of the following conditions:  septic shock, acute hypoxemic respiratory failure, left lung consolidated pneumonia   Critical care was time spent personally by me on the following activities:  Development of treatment plan with patient or surrogate, discussions with consultants, evaluation of patient's response to treatment, examination of patient, obtaining history from patient or surrogate, ordering and performing treatments and interventions, ordering and review of laboratory studies and re-evaluation of patient's condition.  I assumed direction of critical care for this patient from another provider in my specialty: no    This document was prepared using Dragon voice recognition software and may include unintentional dictation errors.    Ottie Glazier, M.D.  Division of [place]

## 2020-09-23 NOTE — Progress Notes (Signed)
Pt's Vent wean attempted multiple times, pt failed wean every time with no spontaneous breaths and desat into low 80's. Pt placed back on resting settings. MD aware

## 2020-09-24 ENCOUNTER — Inpatient Hospital Stay: Payer: PPO

## 2020-09-24 LAB — CBC WITH DIFFERENTIAL/PLATELET
Abs Immature Granulocytes: 0.07 10*3/uL (ref 0.00–0.07)
Basophils Absolute: 0 10*3/uL (ref 0.0–0.1)
Basophils Relative: 0 %
Eosinophils Absolute: 0 10*3/uL (ref 0.0–0.5)
Eosinophils Relative: 0 %
HCT: 46.7 % (ref 39.0–52.0)
Hemoglobin: 14.9 g/dL (ref 13.0–17.0)
Immature Granulocytes: 1 %
Lymphocytes Relative: 9 %
Lymphs Abs: 0.9 10*3/uL (ref 0.7–4.0)
MCH: 30.7 pg (ref 26.0–34.0)
MCHC: 31.9 g/dL (ref 30.0–36.0)
MCV: 96.1 fL (ref 80.0–100.0)
Monocytes Absolute: 1.2 10*3/uL — ABNORMAL HIGH (ref 0.1–1.0)
Monocytes Relative: 12 %
Neutro Abs: 7.3 10*3/uL (ref 1.7–7.7)
Neutrophils Relative %: 78 %
Platelets: 148 10*3/uL — ABNORMAL LOW (ref 150–400)
RBC: 4.86 MIL/uL (ref 4.22–5.81)
RDW: 13.5 % (ref 11.5–15.5)
WBC: 9.5 10*3/uL (ref 4.0–10.5)
nRBC: 0 % (ref 0.0–0.2)

## 2020-09-24 LAB — CULTURE, RESPIRATORY W GRAM STAIN: Culture: NORMAL

## 2020-09-24 LAB — GLUCOSE, CAPILLARY
Glucose-Capillary: 101 mg/dL — ABNORMAL HIGH (ref 70–99)
Glucose-Capillary: 110 mg/dL — ABNORMAL HIGH (ref 70–99)
Glucose-Capillary: 117 mg/dL — ABNORMAL HIGH (ref 70–99)
Glucose-Capillary: 89 mg/dL (ref 70–99)
Glucose-Capillary: 93 mg/dL (ref 70–99)
Glucose-Capillary: 95 mg/dL (ref 70–99)
Glucose-Capillary: 99 mg/dL (ref 70–99)

## 2020-09-24 LAB — BASIC METABOLIC PANEL
Anion gap: 6 (ref 5–15)
BUN: 26 mg/dL — ABNORMAL HIGH (ref 6–20)
CO2: 31 mmol/L (ref 22–32)
Calcium: 8.1 mg/dL — ABNORMAL LOW (ref 8.9–10.3)
Chloride: 98 mmol/L (ref 98–111)
Creatinine, Ser: 0.98 mg/dL (ref 0.61–1.24)
GFR, Estimated: >60 mL/min (ref >60)
Glucose, Bld: 96 mg/dL (ref 70–99)
Potassium: 4.3 mmol/L (ref 3.5–5.1)
Sodium: 135 mmol/L (ref 135–145)

## 2020-09-24 LAB — MAGNESIUM: Magnesium: 2.2 mg/dL (ref 1.7–2.4)

## 2020-09-24 LAB — PHOSPHORUS: Phosphorus: 4.4 mg/dL (ref 2.5–4.6)

## 2020-09-24 MED ORDER — ORAL CARE MOUTH RINSE
15.0000 mL | OROMUCOSAL | Status: DC
  Administered 2020-09-24 – 2020-10-05 (×107): 15 mL via OROMUCOSAL

## 2020-09-24 MED ORDER — PROSOURCE TF PO LIQD
90.0000 mL | Freq: Every day | ORAL | Status: DC
  Administered 2020-09-24 – 2020-09-25 (×7): 90 mL
  Filled 2020-09-24: qty 90

## 2020-09-24 MED ORDER — CHLORHEXIDINE GLUCONATE 0.12% ORAL RINSE (MEDLINE KIT)
15.0000 mL | Freq: Two times a day (BID) | OROMUCOSAL | Status: DC
  Administered 2020-09-24 – 2020-10-10 (×32): 15 mL via OROMUCOSAL

## 2020-09-24 MED ORDER — CHLORHEXIDINE GLUCONATE 0.12 % MT SOLN: 15.0000 mL | Freq: Two times a day (BID) | OROMUCOSAL | Status: DC

## 2020-09-24 MED ORDER — FUROSEMIDE 10 MG/ML IJ SOLN
40.0000 mg | Freq: Every day | INTRAMUSCULAR | Status: DC
  Administered 2020-09-24 – 2020-10-05 (×12): 40 mg via INTRAVENOUS
  Filled 2020-09-24 (×13): qty 4

## 2020-09-24 NOTE — Progress Notes (Signed)
Sedation off approx 5.5 hrs this shift.  Pt not purposeful. Unable to follow commands, he did appear to try to look at RN when his name was called, but did not track or focus.  Right eye very red, and also deviated up and inward at times.  At 1800 I attempted PS/CPAP again (pt has failed SBT eariler in shift)  He maintained O2 sats in low 90s for a few minutes, but became diaphoretic and very flushed, HR and BP increased while his RR was < 10, he did not take breaths when verbally cues.  PRVC restarted, Sedation restarted at < 50%.  TF restarted also.

## 2020-09-25 ENCOUNTER — Inpatient Hospital Stay: Payer: PPO

## 2020-09-25 DIAGNOSIS — J9601 Acute respiratory failure with hypoxia: Secondary | ICD-10-CM | POA: Diagnosis not present

## 2020-09-25 LAB — PROCALCITONIN: Procalcitonin: 0.15 ng/mL

## 2020-09-25 LAB — BASIC METABOLIC PANEL
Anion gap: 5 (ref 5–15)
BUN: 27 mg/dL — ABNORMAL HIGH (ref 6–20)
CO2: 36 mmol/L — ABNORMAL HIGH (ref 22–32)
Calcium: 8.5 mg/dL — ABNORMAL LOW (ref 8.9–10.3)
Chloride: 96 mmol/L — ABNORMAL LOW (ref 98–111)
Creatinine, Ser: 0.91 mg/dL (ref 0.61–1.24)
GFR, Estimated: >60 mL/min (ref >60)
Glucose, Bld: 117 mg/dL — ABNORMAL HIGH (ref 70–99)
Potassium: 4.7 mmol/L (ref 3.5–5.1)
Sodium: 137 mmol/L (ref 135–145)

## 2020-09-25 LAB — CULTURE, RESPIRATORY W GRAM STAIN: Culture: NORMAL

## 2020-09-25 LAB — GLUCOSE, CAPILLARY
Glucose-Capillary: 111 mg/dL — ABNORMAL HIGH (ref 70–99)
Glucose-Capillary: 113 mg/dL — ABNORMAL HIGH (ref 70–99)
Glucose-Capillary: 115 mg/dL — ABNORMAL HIGH (ref 70–99)
Glucose-Capillary: 133 mg/dL — ABNORMAL HIGH (ref 70–99)
Glucose-Capillary: 136 mg/dL — ABNORMAL HIGH (ref 70–99)
Glucose-Capillary: 137 mg/dL — ABNORMAL HIGH (ref 70–99)

## 2020-09-25 LAB — CBC
HCT: 49.1 % (ref 39.0–52.0)
Hemoglobin: 15 g/dL (ref 13.0–17.0)
MCH: 30.2 pg (ref 26.0–34.0)
MCHC: 30.5 g/dL (ref 30.0–36.0)
MCV: 99 fL (ref 80.0–100.0)
Platelets: 141 10*3/uL — ABNORMAL LOW (ref 150–400)
RBC: 4.96 MIL/uL (ref 4.22–5.81)
RDW: 13.5 % (ref 11.5–15.5)
WBC: 9.6 10*3/uL (ref 4.0–10.5)
nRBC: 0 % (ref 0.0–0.2)

## 2020-09-25 LAB — TRIGLYCERIDES: Triglycerides: 154 mg/dL — ABNORMAL HIGH (ref <150)

## 2020-09-25 LAB — PHOSPHORUS: Phosphorus: 4.8 mg/dL — ABNORMAL HIGH (ref 2.5–4.6)

## 2020-09-25 LAB — MAGNESIUM: Magnesium: 2.1 mg/dL (ref 1.7–2.4)

## 2020-09-25 MED ORDER — VITAL HIGH PROTEIN PO LIQD
1000.0000 mL | ORAL | Status: DC
  Administered 2020-09-25 – 2020-10-01 (×7): 1000 mL

## 2020-09-25 MED ORDER — MIDAZOLAM HCL 2 MG/2ML IJ SOLN
INTRAMUSCULAR | Status: AC
  Administered 2020-09-25: 2 mg
  Filled 2020-09-25: qty 2

## 2020-09-25 MED ORDER — DOCUSATE SODIUM 50 MG/5ML PO LIQD
200.0000 mg | Freq: Two times a day (BID) | ORAL | Status: DC
  Administered 2020-09-25 – 2020-10-11 (×19): 200 mg
  Filled 2020-09-25 (×26): qty 20

## 2020-09-25 MED ORDER — FENTANYL CITRATE (PF) 100 MCG/2ML IJ SOLN
100.0000 ug | INTRAMUSCULAR | Status: DC | PRN
  Administered 2020-09-25 (×2): 100 ug via INTRAVENOUS
  Filled 2020-09-25 (×2): qty 2

## 2020-09-25 MED ORDER — PROSOURCE TF PO LIQD
90.0000 mL | Freq: Three times a day (TID) | ORAL | Status: DC
  Administered 2020-09-25 – 2020-10-03 (×22): 90 mL
  Filled 2020-09-25 (×28): qty 90

## 2020-09-25 MED ORDER — POLYETHYLENE GLYCOL 3350 17 G PO PACK
17.0000 g | PACK | Freq: Every day | ORAL | Status: DC
  Administered 2020-09-25 – 2020-10-11 (×11): 17 g
  Filled 2020-09-25 (×12): qty 1

## 2020-09-25 MED ORDER — PROPOFOL 1000 MG/100ML IV EMUL
INTRAVENOUS | Status: AC
  Administered 2020-09-25: 20 ug/kg/min via INTRAVENOUS
  Filled 2020-09-25: qty 100

## 2020-09-25 MED ORDER — DEXMEDETOMIDINE HCL IN NACL 400 MCG/100ML IV SOLN
0.4000 ug/kg/h | INTRAVENOUS | Status: DC
  Administered 2020-09-25: 1.2 ug/kg/h via INTRAVENOUS
  Administered 2020-09-25: 1 ug/kg/h via INTRAVENOUS
  Administered 2020-09-25 – 2020-09-26 (×11): 1.2 ug/kg/h via INTRAVENOUS
  Administered 2020-09-26: 1 ug/kg/h via INTRAVENOUS
  Administered 2020-09-27 (×3): 1.2 ug/kg/h via INTRAVENOUS
  Administered 2020-09-27: 0.6 ug/kg/h via INTRAVENOUS
  Administered 2020-09-27 (×4): 1.2 ug/kg/h via INTRAVENOUS
  Administered 2020-09-27: 0.6 ug/kg/h via INTRAVENOUS
  Administered 2020-09-28: 0.8 ug/kg/h via INTRAVENOUS
  Administered 2020-09-28: 0.6 ug/kg/h via INTRAVENOUS
  Administered 2020-09-28: 1.2 ug/kg/h via INTRAVENOUS
  Administered 2020-09-28: 0.4 ug/kg/h via INTRAVENOUS
  Administered 2020-09-28 – 2020-09-29 (×3): 0.8 ug/kg/h via INTRAVENOUS
  Administered 2020-09-29: 1.2 ug/kg/h via INTRAVENOUS
  Administered 2020-09-29 (×2): 0.8 ug/kg/h via INTRAVENOUS
  Administered 2020-09-29: 1.2 ug/kg/h via INTRAVENOUS
  Administered 2020-09-29: 0.8 ug/kg/h via INTRAVENOUS
  Administered 2020-09-29: 1.2 ug/kg/h via INTRAVENOUS
  Administered 2020-09-30 (×7): 1 ug/kg/h via INTRAVENOUS
  Administered 2020-09-30: 1.2 ug/kg/h via INTRAVENOUS
  Administered 2020-10-01: 0.4 ug/kg/h via INTRAVENOUS
  Administered 2020-10-01: 1 ug/kg/h via INTRAVENOUS
  Administered 2020-10-01 (×2): 1.2 ug/kg/h via INTRAVENOUS
  Administered 2020-10-01 (×2): 1 ug/kg/h via INTRAVENOUS
  Administered 2020-10-02 – 2020-10-03 (×10): 1.2 ug/kg/h via INTRAVENOUS
  Filled 2020-09-25 (×67): qty 100

## 2020-09-25 MED ORDER — PROPOFOL 1000 MG/100ML IV EMUL
5.0000 ug/kg/min | INTRAVENOUS | Status: DC
  Administered 2020-09-26 (×4): 25 ug/kg/min via INTRAVENOUS
  Administered 2020-09-26: 10 ug/kg/min via INTRAVENOUS
  Administered 2020-09-27: 20 ug/kg/min via INTRAVENOUS
  Administered 2020-09-27 (×2): 25 ug/kg/min via INTRAVENOUS
  Administered 2020-09-28 – 2020-09-29 (×12): 30 ug/kg/min via INTRAVENOUS
  Administered 2020-09-30 (×5): 20 ug/kg/min via INTRAVENOUS
  Administered 2020-10-01: 50 ug/kg/min via INTRAVENOUS
  Administered 2020-10-01 (×2): 60 ug/kg/min via INTRAVENOUS
  Administered 2020-10-01: 20 ug/kg/min via INTRAVENOUS
  Administered 2020-10-01: 50 ug/kg/min via INTRAVENOUS
  Administered 2020-10-01: 20 ug/kg/min via INTRAVENOUS
  Filled 2020-09-25 (×35): qty 100

## 2020-09-25 NOTE — Progress Notes (Signed)
CRITICAL CARE NOTE    Name: Eddie Chapman MRN: [id number] DOB: 12/28/1960     LOS: 4   SUBJECTIVE FINDINGS & SIGNIFICANT EVENTS    Patient description:  59 yo M with hx of cerebral aneurysm, PVD, HFrEF, pulmonary htn, morbid obesity, OSA, GERD, hx of TBI, dyslipidemia who came in after girlfriend found him to be unresponsive.  He was found to be severely hypoxemic in circulatory shock with left lung infiltrate on CXR. Labwork consistent with sepsis.   Events:  09/22/20- patient is off vasopressors.  He is weaning on FIO2.  Family at bedside (girlfriend).  Patient is weaned to 40% FIO2. 09/23/20- patient failed SBT today. 09/25/20-patient with continued tenacious secretions and patient also with altered mentation.  Started Precedex in hopes to be able to wean Versed.  As patient extremely altered and difficult to arouse.  CT head and CT chest obtained   Lines/tubes :  PIVx2  Microbiology/Sepsis markers: Results for orders placed or performed during the hospital encounter of 09/21/20  Resp Panel by RT-PCR (Flu A&B, Covid) Nasopharyngeal Swab     Status: None   Collection Time: 09/21/20  2:12 PM   Specimen: Nasopharyngeal Swab; Nasopharyngeal(NP) swabs in vial transport medium  Result Value Ref Range Status   SARS Coronavirus 2 by RT PCR NEGATIVE NEGATIVE Final    Comment: (NOTE) SARS-CoV-2 target nucleic acids are NOT DETECTED.  The SARS-CoV-2 RNA is generally detectable in upper respiratory specimens during the acute phase of infection. The lowest concentration of SARS-CoV-2 viral copies this assay can detect is 138 copies/mL. A negative result does not preclude SARS-Cov-2 infection and should not be used as the sole basis for treatment or other patient management decisions. A negative result may occur  with  improper specimen collection/handling, submission of specimen other than nasopharyngeal swab, presence of viral mutation(s) within the areas targeted by this assay, and inadequate number of viral copies(<138 copies/mL). A negative result must be combined with clinical observations, patient history, and epidemiological information. The expected result is Negative.  Fact Sheet for Patients:  EntrepreneurPulse.com.au  Fact Sheet for Healthcare Providers:  IncredibleEmployment.be  This test is no t yet approved or cleared by the [place] FDA and  has been authorized for detection and/or diagnosis of SARS-CoV-2 by FDA under an Emergency Use Authorization (EUA). This EUA will remain  in effect (meaning this test can be used) for the duration of the COVID-19 declaration under Section 564(b)(1) of the Act, 21 U.S.C.section 360bbb-3(b)(1), unless the authorization is terminated  or revoked sooner.       Influenza A by PCR NEGATIVE NEGATIVE Final   Influenza B by PCR NEGATIVE NEGATIVE Final    Comment: (NOTE) The Xpert Xpress SARS-CoV-2/FLU/RSV plus assay is intended as an aid in the diagnosis of influenza from Nasopharyngeal swab specimens and should not be used as a sole basis for treatment. Nasal washings and aspirates are unacceptable for Xpert Xpress SARS-CoV-2/FLU/RSV testing.  Fact Sheet for Patients: EntrepreneurPulse.com.au  Fact Sheet for Healthcare Providers: IncredibleEmployment.be  This test is not yet approved or cleared by the [place] FDA and has been authorized for detection and/or diagnosis of SARS-CoV-2 by FDA under an Emergency Use Authorization (EUA). This EUA will remain in effect (meaning this test can be used) for the duration of the COVID-19 declaration under Section 564(b)(1) of the Act, 21 U.S.C. section 360bbb-3(b)(1), unless the authorization is terminated  or revoked.  Performed at [facility], [place], [place]  [address]   Culture, blood (routine x 2)     Status: None (Preliminary result)   Collection Time: 09/21/20  2:45 PM   Specimen: BLOOD  Result Value Ref Range Status   Specimen Description BLOOD BLOOD RIGHT FOREARM  Final   Special Requests   Final    BOTTLES DRAWN AEROBIC AND ANAEROBIC Blood Culture results may not be optimal due to an inadequate volume of blood received in culture bottles   Culture   Final    NO GROWTH 4 DAYS Performed at [facility], [address], [place], [place] [address]    Report Status PENDING  Incomplete  Culture, blood (routine x 2)     Status: None (Preliminary result)   Collection Time: 09/21/20  2:45 PM   Specimen: BLOOD  Result Value Ref Range Status   Specimen Description BLOOD RIGHT ANTECUBITAL  Final   Special Requests   Final    BOTTLES DRAWN AEROBIC AND ANAEROBIC Blood Culture results may not be optimal due to an excessive volume of blood received in culture bottles   Culture   Final    NO GROWTH 4 DAYS Performed at [facility], [address], [place], [place] [address]    Report Status PENDING  Incomplete  Culture, respiratory (non-expectorated)     Status: None   Collection Time: 09/21/20  3:14 PM   Specimen: Tracheal Aspirate; Respiratory  Result Value Ref Range Status   Specimen Description   Final    TRACHEAL ASPIRATE Performed at [facility], [address], [place], [place] [address]    Special Requests   Final    NONE Performed at [facility], [address] [place], [place] [address]    Gram Stain   Final    FEW WBC PRESENT,BOTH PMN AND MONONUCLEAR FEW GRAM POSITIVE COCCI IN PAIRS IN CHAINS FEW GRAM NEGATIVE RODS    Culture   Final    FEW Normal respiratory flora-no Staph aureus or Pseudomonas seen Performed at [facility], [address], [place],  [place] [address]    Report Status  09/24/2020 FINAL  Final  MRSA PCR Screening     Status: None   Collection Time: 09/21/20  4:15 PM   Specimen: Nasal Mucosa; Nasopharyngeal  Result Value Ref Range Status   MRSA by PCR NEGATIVE NEGATIVE Final    Comment:        The GeneXpert MRSA Assay (FDA approved for NASAL specimens only), is one component of a comprehensive MRSA colonization surveillance program. It is not intended to diagnose MRSA infection nor to guide or monitor treatment for MRSA infections. Performed at [facility], [address] [place], [place] [address]   Culture, respiratory     Status: None   Collection Time: 09/22/20 11:40 AM   Specimen: Tracheal Aspirate; Respiratory  Result Value Ref Range Status   Specimen Description   Final    TRACHEAL ASPIRATE Performed at [facility], [address], [place], [place] [address]    Special Requests   Final    NONE Performed at [facility], [address] [place], [place] [address]    Gram Stain   Final    ABUNDANT WBC PRESENT, PREDOMINANTLY PMN NO ORGANISMS SEEN    Culture   Final    RARE Normal respiratory flora-no Staph aureus or Pseudomonas seen Performed at [facility] [address], [place],  [address]    Report Status 09/25/2020 FINAL  Final    Anti-infectives:  Anti-infectives (From admission, onward)   Start     Dose/Rate Route Frequency Ordered Stop   09/21/20 1845  piperacillin-tazobactam (ZOSYN) IVPB 3.375 g        3.375 g 12.5 mL/hr over 240 Minutes Intravenous Every 8 hours 09/21/20 1805     09/21/20 1530  vancomycin (VANCOREADY) IVPB 1500 mg/300 mL        1,500 mg 150 mL/hr over 120 Minutes Intravenous  Once 09/21/20 1425 09/21/20 2051   09/21/20 1430  ceFEPIme (MAXIPIME) 2 g in sodium chloride 0.9 % 100 mL IVPB        2 g 200 mL/hr over 30 Minutes Intravenous  Once 09/21/20 1416 09/21/20 1606   09/21/20 1430  vancomycin (VANCOCIN) IVPB 1000 mg/200 mL premix        1,000 mg 200  mL/hr over 60 Minutes Intravenous  Once 09/21/20 1416 09/21/20 1714   09/21/20 1430  vancomycin (VANCOREADY) IVPB 1500 mg/300 mL  Status:  Discontinued        1,500 mg 150 mL/hr over 120 Minutes Intravenous  Once 09/21/20 1425 09/21/20 1425        PAST MEDICAL HISTORY   Past Medical History:  Diagnosis Date  . Adenomatous colon polyp   . Depression   . Ear drum perforation    left x2   . GERD (gastroesophageal reflux disease)   . Hyperlipidemia   . Hypertension   . T8 vertebral fracture [facility])      SURGICAL HISTORY   Past Surgical History:  Procedure Laterality Date  . CARPAL TUNNEL RELEASE     left hand  . COLONOSCOPY  09/18/2009, 09/15/2014  . COLONOSCOPY WITH PROPOFOL N/A 12/29/2017   Procedure: COLONOSCOPY WITH PROPOFOL;  Surgeon: Manya Silvas, MD;  Location: [facility] ENDOSCOPY;  Service: Endoscopy;  Laterality: N/A;  . EYE SURGERY    . FRACTURE SURGERY    . HERNIA REPAIR    . LIPOMA EXCISION    . SPINE SURGERY       FAMILY HISTORY   Family History  Problem Relation Age of Onset  . Cancer Mother   . Varicose Veins Mother      SOCIAL HISTORY   Social History   Tobacco Use  . Smoking status: Never Smoker  . Smokeless tobacco: Never Used  Vaping Use  . Vaping Use: Never used  Substance Use Topics  . Alcohol use: Never  . Drug use: Never     MEDICATIONS   Current Medication:  Current Facility-Administered Medications:  .  0.9 %  sodium chloride infusion, 250 mL, Intravenous, Continuous, Bradly Bienenstock, NP, Stopped at 09/25/20 0532 .  chlorhexidine gluconate (MEDLINE KIT) (PERIDEX) 0.12 % solution 15 mL, 15 mL, Mouth Rinse, BID, [name], Fuad, MD, 15 mL at 09/25/20 0703 .  Chlorhexidine Gluconate Cloth 2 % PADS 6 each, 6 each, Topical, Daily, Bradly Bienenstock, NP, 6 each at 09/24/20 2109 .  dexmedetomidine (PRECEDEX) 400 MCG/100ML (4 mcg/mL) infusion, 0.4-1.2 mcg/kg/hr, Intravenous, Titrated, Tyna Jaksch, MD, Last Rate: 44.6 mL/hr at  09/25/20 1650, 1.2 mcg/kg/hr at 09/25/20 1650 .  docusate (COLACE) 50 MG/5ML liquid 200 mg, 200 mg, Per Tube, BID, [name], RPH .  docusate sodium (COLACE) capsule 100 mg, 100 mg, Oral, BID PRN, Aleskerov, Fuad, MD .  feeding supplement (PROSource TF) liquid 90 mL, 90 mL, Per Tube, TID, Tyna Jaksch, MD .  feeding supplement (VITAL HIGH PROTEIN) liquid 1,000 mL, 1,000 mL, Per Tube,  Continuous, Tyna Jaksch, MD, Last Rate: 60 mL/hr at 09/25/20 1543, 1,000 mL at 09/25/20 1543 .  fentaNYL (SUBLIMAZE) injection 100 mcg, 100 mcg, Intravenous, Q1H PRN, Tyna Jaksch, MD, 100 mcg at 09/25/20 1427 .  fentaNYL 2518mg in NS 2558m(1021mml) infusion-PREMIX, 0-400 mcg/hr, Intravenous, Continuous, Siadecki, SebFelix AhmadiD, Last Rate: 10 mL/hr at 09/25/20 0700, 100 mcg/hr at 09/25/20 0700 .  furosemide (LASIX) injection 40 mg, 40 mg, Intravenous, Daily, AleLanney Ginsuad, MD, 40 mg at 09/25/20 0855 .  heparin injection 5,000 Units, 5,000 Units, Subcutaneous, Q8H, AleOttie GlazierD, 5,000 Units at 09/25/20 1350 .  ipratropium-albuterol (DUONEB) 0.5-2.5 (3) MG/3ML nebulizer solution 3 mL, 3 mL, Nebulization, Q6H, Aleskerov, Fuad, MD, 3 mL at 09/25/20 1411 .  LORazepam (ATIVAN) injection 2 mg, 2 mg, Intravenous, Q3H PRN, AleOttie GlazierD, 2 mg at 09/22/20 2224 .  MEDLINE mouth rinse, 15 mL, Mouth Rinse, 10 times per day, AleOttie GlazierD, 15 mL at 09/25/20 1643 .  midazolam (VERSED) 50 mg/50 mL (1 mg/mL) premix infusion, 0.5-10 mg/hr, Intravenous, Continuous, Siadecki, Sebastian, MD, Last Rate: 1.5 mL/hr at 09/25/20 0900, 1.5 mg/hr at 09/25/20 0900 .  pantoprazole sodium (PROTONIX) 40 mg/20 mL oral suspension 40 mg, 40 mg, Per Tube, Daily, GruDallie PilesPH, 40 mg at 09/25/20 0855 .  piperacillin-tazobactam (ZOSYN) IVPB 3.375 g, 3.375 g, Intravenous, Q8H, CunBenn MoulderPH, Last Rate: 12.5 mL/hr at 09/25/20 1352, 3.375 g at 09/25/20 1352 .  polyethylene glycol (MIRALAX /  GLYCOLAX) packet 17 g, 17 g, Oral, Daily PRN, AleLanney Ginsuad, MD .  polyethylene glycol (MIRALAX / GLYCOLAX) packet 17 g, 17 g, Per Tube, Daily, GruDallie PilesPH, 17 g at 09/25/20 1642 .  sucralfate (CARAFATE) 1 GM/10ML suspension 1 g, 1 g, Per Tube, TID WC & HS, AleLanney Ginsuad, MD, 1 g at 09/25/20 1643    ALLERGIES   Divalproex sodium and Valproic acid    REVIEW OF SYSTEMS     Unable to obtain due to unresponsive state in critically ill state  PHYSICAL EXAMINATION   Vital Signs: Temp:  [98.42 F (36.9 C)-99.86 F (37.7 C)] 99.5 F (37.5 C) (12/27 1700) Pulse Rate:  [61-107] 67 (12/27 1700) Resp:  [15-33] 20 (12/27 1700) BP: (109-158)/(65-125) 152/99 (12/27 1700) SpO2:  [96 %-100 %] 100 % (12/27 1700) FiO2 (%):  [40 %] 40 % (12/27 1420)  GENERAL:obese age appropirate. HEAD: Normocephalic, atraumatic.  EYES: Pupils equal, round, reactive to light.  No scleral icterus.  MOUTH: Moist mucosal membrane. NECK: Supple. No thyromegaly. No nodules. No JVD.  PULMONARY: decreased L lung breath sounds, rhonchi bilaterally  CARDIOVASCULAR: S1 and S2. Regular rate and rhythm. No murmurs, rubs, or gallops.  GASTROINTESTINAL: Soft, nontender, non-distended. No masses. Positive bowel sounds. No hepatosplenomegaly.  MUSCULOSKELETAL: No swelling, clubbing, or edema.  NEUROLOGIC: Mild distress due to acute illness SKIN:intact,warm,dry   PERTINENT DATA     Infusions: . sodium chloride Stopped (09/25/20 0532)  . dexmedetomidine (PRECEDEX) IV infusion 1.2 mcg/kg/hr (09/25/20 1650)  . feeding supplement (VITAL HIGH PROTEIN) 1,000 mL (09/25/20 1543)  . fentaNYL infusion INTRAVENOUS 100 mcg/hr (09/25/20 0700)  . midazolam 1.5 mg/hr (09/25/20 0900)  . piperacillin-tazobactam (ZOSYN)  IV 3.375 g (09/25/20 1352)   Scheduled Medications: . chlorhexidine gluconate (MEDLINE KIT)  15 mL Mouth Rinse BID  . Chlorhexidine Gluconate Cloth  6 each Topical Daily  . docusate  200 mg Per Tube  BID  . feeding supplement (PROSource TF)  90 mL Per Tube TID  .  furosemide  40 mg Intravenous Daily  . heparin  5,000 Units Subcutaneous Q8H  . ipratropium-albuterol  3 mL Nebulization Q6H  . mouth rinse  15 mL Mouth Rinse 10 times per day  . pantoprazole sodium  40 mg Per Tube Daily  . polyethylene glycol  17 g Per Tube Daily  . sucralfate  1 g Per Tube TID WC & HS   PRN Medications: docusate sodium, fentaNYL (SUBLIMAZE) injection, LORazepam, polyethylene glycol Hemodynamic parameters:   Intake/Output: 12/26 0701 - 12/27 0700 In: 1396.3 [I.V.:546.8; NG/GT:703.7; IV Piggyback:145.8] Out: 1610 [Urine:4730]  Ventilator  Settings: Vent Mode: PRVC FiO2 (%):  [40 %] 40 % Set Rate:  [15 bmp] 15 bmp Vt Set:  [500 mL] 500 mL PEEP:  [5 cmH20-8 cmH20] 8 cmH20 Pressure Support:  [10 cmH20] 10 cmH20 Plateau Pressure:  [17 cmH20-19 cmH20] 19 cmH20    LAB RESULTS:  Basic Metabolic Panel: Recent Labs  Lab 09/21/20 1519 09/22/20 0432 09/23/20 0456 09/24/20 0615 09/25/20 0534  NA 143 140 138 135 137  K 5.0 4.8 3.9 4.3 4.7  CL 97* 97* 98 98 96*  CO2 33* 36* 31 31 36*  GLUCOSE 114* 93 95 96 117*  BUN 18 20 24* 26* 27*  CREATININE 1.17 1.16 1.21 0.98 0.91  CALCIUM 8.5* 8.1* 8.0* 8.1* 8.5*  MG  --   --  1.8 2.2 2.1  PHOS  --   --  2.7 4.4 4.8*   Liver Function Tests: Recent Labs  Lab 09/21/20 1519  AST 33  ALT 33  ALKPHOS 54  BILITOT 0.9  PROT 6.1*  ALBUMIN 3.1*   No results for input(s): LIPASE, AMYLASE in the last 168 hours. No results for input(s): AMMONIA in the last 168 hours. CBC: Recent Labs  Lab 09/21/20 1327 09/22/20 0432 09/23/20 0456 09/24/20 0615 09/25/20 0534  WBC 12.5* 13.5* 12.2* 9.5 9.6  NEUTROABS 10.5* 10.6* 9.6* 7.3  --   HGB 19.0* 14.9 15.1 14.9 15.0  HCT 62.8* 48.2 47.5 46.7 49.1  MCV 100.0 97.6 95.4 96.1 99.0  PLT 142* 126* 136* 148* 141*   Cardiac Enzymes: No results for input(s): CKTOTAL, CKMB, CKMBINDEX, TROPONINI in the last 168  hours. BNP: Invalid input(s): POCBNP CBG: Recent Labs  Lab 09/24/20 2338 09/25/20 0330 09/25/20 0756 09/25/20 1135 09/25/20 1644  GLUCAP 101* 111* 113* 133* 115*       IMAGING RESULTS:  Imaging: CT HEAD WO CONTRAST  Result Date: 09/25/2020 CLINICAL DATA:  59 year old male with altered mental status. EXAM: CT HEAD WITHOUT CONTRAST TECHNIQUE: Contiguous axial images were obtained from the base of the skull through the vertex without intravenous contrast. COMPARISON:  Head CT dated 09/21/2020. FINDINGS: Brain: There is a large area of old infarct and encephalomalacia involving the right MCA territory. There is no acute intracranial hemorrhage. No mass effect midline shift no extra-axial fluid collection. Vascular: No hyperdense vessel or unexpected calcification. Skull: Normal. Negative for fracture or focal lesion. Sinuses/Orbits: There is diffuse mucoperiosteal thickening of paranasal sinuses. No air-fluid level. The mastoid air cells are clear. Other: None IMPRESSION: 1. No acute intracranial hemorrhage. 2. 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 Electronically Signed   By: Anner Crete M.D.   On: 09/25/2020 15:36   CT CHEST WO CONTRAST  Result Date: 09/25/2020 CLINICAL DATA:  Mental status change. EXAM: CT CHEST WITHOUT CONTRAST TECHNIQUE: Multidetector CT imaging of the chest was performed following the standard protocol without IV contrast. COMPARISON:  Chest  x-ray 09/24/2020, chest x-ray 09/22/2020. FINDINGS: Lines and tubes: Endotracheal tube terminates 0.5 cm above the carina just proximal to the origin of the left mainstem bronchus. Enteric tube courses below the hemidiaphragm with tip and side port collimated off view. Enteric tube is noted to course within the gastric lumen. Right internal jugular venous catheter with tip terminating in the proximal superior vena cava just proximal to the confluence of the superior vena cava and left  brachiocephalic vein. Cardiovascular: Cardiomegaly. No significant pericardial effusion. Limited evaluation of the aortic root due to motion artifact and noncontrast study. Suggestion of an ectatic ascending aorta that is completely evaluated on this noncontrast study with ascending aorta measuring at least up to 4 cm. Descending thoracic aorta appears to be normal in caliber. Mild atherosclerotic plaque of the thoracic aorta. At least mild three-vessel coronary artery calcifications. The main left and right pulmonary arteries are enlarged in caliber. Mediastinum/Nodes: Enlarged 1 cm precarinal lymph node. Otherwise prominent mediastinal lymph nodes. No gross hilar adenopathy, noting limited sensitivity for the detection of hilar adenopathy on this noncontrast study. No axillary lymphadenopathy. The esophagus and trachea are unremarkable. The thyroid is partially visualized and grossly unremarkable. Lungs/Pleura: Expiratory phase of respiration. Consolidation of the left lower lobe. No definite evaluation of intraparenchymal abscess formation; however, please note markedly limited evaluation on this noncontrast study no cavitary lesion identified. Passive atelectasis of the right lower lobe. No pulmonary nodule or mass within the aerated lungs. Trace bilateral, right greater than left, pleural effusions. No pneumothorax. Upper Abdomen: A 3.5 cm right adrenal gland mass is noted with a density of 80 Hounsfield units. No other acute abnormality. Musculoskeletal: No abdominal wall hernia or abnormality No suspicious lytic or blastic osseous lesions. No acute displaced fracture. Old healed left rib fractures. Multilevel degenerative changes of the spine with several mid to lowerthoracic vertebral bodies demonstrating height loss. The T9 vertebral body demonstrates at least 80% vertebral body height loss. IMPRESSION: 1. Endotracheal tube located at the carina, just proximal to the origin of the left mainstem bronchus.  This study demonstrates in expiratory phase of respiration, therefore recommend retraction by no more than 2 cm. 2. Left lower lobe consolidation with associated bilateral trace pleural effusions. Associated 1 cm precarinal lymph node that is likely reactive in etiology. No definite abscess formation identified; however, limited evaluation on this noncontrast study. An underlying pulmonary mass cannot be excluded. Followup PA and lateral chest X-ray is recommended in 3-4 weeks following therapy to ensure resolution and exclude underlying malignancy. 3. A indeterminate 3.5 cm right adrenal gland mass. Recommend further evaluation with dedicated CT adrenal protocol. 4. Suggestion of an ectatic ascending aorta with limited evaluation on this noncontrast study. Aortic aneurysm NOS (ICD10-I71.9). Aortic Atherosclerosis (ICD10-I70.0). 5. Other imaging findings of potential clinical significance: At least mild four-vessel coronary artery calcifications. Cardiomegaly. Multilevel mid to lower thoracic chronic appearing compression fractures with greater than 80% height loss of the T9 vertebral body. These results were called by telephone at the time of interpretation on 09/25/2020 at 3:41 pm to provider [facility] , who verbally acknowledged these results. Electronically Signed   By: Iven Finn M.D.   On: 09/25/2020 15:55   DG Chest Port 1 View  Result Date: 09/24/2020 CLINICAL DATA:  Unresponsive.  Hypoxia EXAM: PORTABLE CHEST 1 VIEW COMPARISON:  Two days ago FINDINGS: Endotracheal tube with tip halfway between the clavicular heads and carina. Right IJ line with tip near the upper SVC. The enteric tube reaches the stomach.  Stable cardiomegaly and vascular pedicle widening with vascular congestion. Asymmetric retrocardiac opacity with pleural fluid. No visible pneumothorax. IMPRESSION: 1. Stable hardware positioning. 2. Cardiomegaly and vascular congestion with left pleural effusion. 3. Retrocardiac  atelectasis or pneumonia. Electronically Signed   By: Monte Fantasia M.D.   On: 09/24/2020 07:59   _0 @ CT HEAD WO CONTRAST  Result Date: 09/25/2020 CLINICAL DATA:  59 year old male with altered mental status. EXAM: CT HEAD WITHOUT CONTRAST TECHNIQUE: Contiguous axial images were obtained from the base of the skull through the vertex without intravenous contrast. COMPARISON:  Head CT dated 09/21/2020. FINDINGS: Brain: There is a large area of old infarct and encephalomalacia involving the right MCA territory. There is no acute intracranial hemorrhage. No mass effect midline shift no extra-axial fluid collection. Vascular: No hyperdense vessel or unexpected calcification. Skull: Normal. Negative for fracture or focal lesion. Sinuses/Orbits: There is diffuse mucoperiosteal thickening of paranasal sinuses. No air-fluid level. The mastoid air cells are clear. Other: None IMPRESSION: 1. No acute intracranial hemorrhage. 2. 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 Electronically Signed   By: Anner Crete M.D.   On: 09/25/2020 15:36   CT CHEST WO CONTRAST  Result Date: 09/25/2020 CLINICAL DATA:  Mental status change. EXAM: CT CHEST WITHOUT CONTRAST TECHNIQUE: Multidetector CT imaging of the chest was performed following the standard protocol without IV contrast. COMPARISON:  Chest x-ray 09/24/2020, chest x-ray 09/22/2020. FINDINGS: Lines and tubes: Endotracheal tube terminates 0.5 cm above the carina just proximal to the origin of the left mainstem bronchus. Enteric tube courses below the hemidiaphragm with tip and side port collimated off view. Enteric tube is noted to course within the gastric lumen. Right internal jugular venous catheter with tip terminating in the proximal superior vena cava just proximal to the confluence of the superior vena cava and left brachiocephalic vein. Cardiovascular: Cardiomegaly. No significant pericardial effusion. Limited  evaluation of the aortic root due to motion artifact and noncontrast study. Suggestion of an ectatic ascending aorta that is completely evaluated on this noncontrast study with ascending aorta measuring at least up to 4 cm. Descending thoracic aorta appears to be normal in caliber. Mild atherosclerotic plaque of the thoracic aorta. At least mild three-vessel coronary artery calcifications. The main left and right pulmonary arteries are enlarged in caliber. Mediastinum/Nodes: Enlarged 1 cm precarinal lymph node. Otherwise prominent mediastinal lymph nodes. No gross hilar adenopathy, noting limited sensitivity for the detection of hilar adenopathy on this noncontrast study. No axillary lymphadenopathy. The esophagus and trachea are unremarkable. The thyroid is partially visualized and grossly unremarkable. Lungs/Pleura: Expiratory phase of respiration. Consolidation of the left lower lobe. No definite evaluation of intraparenchymal abscess formation; however, please note markedly limited evaluation on this noncontrast study no cavitary lesion identified. Passive atelectasis of the right lower lobe. No pulmonary nodule or mass within the aerated lungs. Trace bilateral, right greater than left, pleural effusions. No pneumothorax. Upper Abdomen: A 3.5 cm right adrenal gland mass is noted with a density of 80 Hounsfield units. No other acute abnormality. Musculoskeletal: No abdominal wall hernia or abnormality No suspicious lytic or blastic osseous lesions. No acute displaced fracture. Old healed left rib fractures. Multilevel degenerative changes of the spine with several mid to lowerthoracic vertebral bodies demonstrating height loss. The T9 vertebral body demonstrates at least 80% vertebral body height loss. IMPRESSION: 1. Endotracheal tube located at the carina, just proximal to the origin of the left mainstem bronchus. This study demonstrates in expiratory phase of  respiration, therefore recommend retraction by no  more than 2 cm. 2. Left lower lobe consolidation with associated bilateral trace pleural effusions. Associated 1 cm precarinal lymph node that is likely reactive in etiology. No definite abscess formation identified; however, limited evaluation on this noncontrast study. An underlying pulmonary mass cannot be excluded. Followup PA and lateral chest X-ray is recommended in 3-4 weeks following therapy to ensure resolution and exclude underlying malignancy. 3. A indeterminate 3.5 cm right adrenal gland mass. Recommend further evaluation with dedicated CT adrenal protocol. 4. Suggestion of an ectatic ascending aorta with limited evaluation on this noncontrast study. Aortic aneurysm NOS (ICD10-I71.9). Aortic Atherosclerosis (ICD10-I70.0). 5. Other imaging findings of potential clinical significance: At least mild four-vessel coronary artery calcifications. Cardiomegaly. Multilevel mid to lower thoracic chronic appearing compression fractures with greater than 80% height loss of the T9 vertebral body. These results were called by telephone at the time of interpretation on 09/25/2020 at 3:41 pm to provider [facility] , who verbally acknowledged these results. Electronically Signed   By: Iven Finn M.D.   On: 09/25/2020 15:55        CT from 09/25/20   Other: None  IMPRESSION: 1. No acute intracranial hemorrhage. 2. 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   IMPRESSION: 1. Endotracheal tube located at the carina, just proximal to the origin of the left mainstem bronchus. This study demonstrates in expiratory phase of respiration, therefore recommend retraction by no more than 2 cm. 2. Left lower lobe consolidation with associated bilateral trace pleural effusions. Associated 1 cm precarinal lymph node that is likely reactive in etiology. No definite abscess formation identified; however, limited evaluation on this noncontrast study. An  underlying pulmonary mass cannot be excluded. Followup PA and lateral chest X-ray is recommended in 3-4 weeks following therapy to ensure resolution and exclude underlying malignancy. 3. A indeterminate 3.5 cm right adrenal gland mass. Recommend further evaluation with dedicated CT adrenal protocol. 4. Suggestion of an ectatic ascending aorta with limited evaluation on this noncontrast study. Aortic aneurysm NOS (ICD10-I71.9). Aortic Atherosclerosis (ICD10-I70.0). 5. Other imaging findings of potential clinical significance: At least mild four-vessel coronary artery calcifications. Cardiomegaly. Multilevel mid to lower thoracic chronic appearing compression fractures with greater than 80% height loss of the T9 vertebral body.  These results were called by telephone at the time of interpretation on 09/25/2020 at 3:41 pm to provider [facility] , who verbally acknowledged these results.    Electronically Signed   By: Anner Crete M.D.   On: 09/25/2020 15:36  ASSESSMENT AND PLAN    -Multidisciplinary rounds held today  Acute Hypoxic Respiratory Failure -due to left lung pneumonia -there is possible broken and displaced ribs on right 4/5 , as well as consolidated dense LLL infiltrate- will characterize with CT chest -continue Bronchodilator Therapy -Wean Fio2 and PEEP as tolerated -will perform SAT/SBT when respiratory parameters are met -On Zosyn will complete 7-day course today day 5 -tracheal aspirate -+ polymicrobial growth  -Have ordered for pulmonary hygiene to be performed every 4 hours -CT scan performed and notable for  Septic shock   - due toleft lung consolidated pneumonia  -12/25- shock physiology resolved -Zosyn for 7 days   Renal Failure-acute stage 2 -most likely due to ATN -follow chem 7 -follow UO -continue Foley Catheter-assess need daily   Aortic ectasia -Will need to be followed up outpatient with contrasted study per  radiology  ID -continue IV abx as prescibed -follow up cultures  GI/Nutrition GI PROPHYLAXIS as indicated DIET-->TF's as tolerated Constipation protocol as indicated  ENDO - ICU hypoglycemic\Hyperglycemia protocol -check FSBS per protocol   ELECTROLYTES -follow labs as needed -replace as needed -pharmacy consultation   DVT/GI PRX ordered -SCDs  TRANSFUSIONS AS NEEDED MONITOR FSBS ASSESS the need for LABS as needed   Critical care provider statement:    Critical care time (minutes):  35   Critical care time was exclusive of:  Separately billable procedures and treating other patients   Critical care was necessary to treat or prevent imminent or life-threatening deterioration of the following conditions:  septic shock, acute hypoxemic respiratory failure, left lung consolidated pneumonia   Critical care was time spent personally by me on the following activities:  Development of treatment plan with patient or surrogate, discussions with consultants, evaluation of patient's response to treatment, examination of patient, obtaining history from patient or surrogate, ordering and performing treatments and interventions, ordering and review of laboratory studies and re-evaluation of patient's condition.  I assumed direction of critical care for this patient from another provider in my specialty: no    This document was prepared using Dragon voice recognition software and may include unintentional dictation errors.                                                                              Newell Coral DO Internal Medicine/Pediatrics Pulmonary and Critical Care Fellow PGY-7

## 2020-09-25 NOTE — Progress Notes (Addendum)
Nutrition Follow-up  DOCUMENTATION CODES:   Morbid obesity  INTERVENTION:  Initiate new goal TF regimen of Vital High Protein at 60 mL/hr (1440 mL goal daily volume) + PROSource TF 90 mL TID per tube. Provides 1680 kcal, 192 grams of protein, 1210 mL H2O daily.  NUTRITION DIAGNOSIS:   Inadequate oral intake related to inability to eat as evidenced by NPO status.  Ongoing.  GOAL:   Patient will meet greater than or equal to 90% of their needs  Met with TF regimen.  MONITOR:   Vent status,Labs,Weight trends,TF tolerance,I & O's  REASON FOR ASSESSMENT:   Ventilator,Consult Enteral/tube feeding initiation and management  ASSESSMENT:   59 year old male with PMHx of cerebral aneurysm, TBI, HLD, HTN, depression, GERD, HFrEF, OSA admitted with severe hypoxemia, circulatory shock, left lung consolidated PNA.  12/23 intubated  Patient is currently intubated on ventilator support MV: 7.5 L/min Temp (24hrs), Avg:98.6 F (37 C), Min:98.42 F (36.9 C), Max:99.86 F (37.7 C)  Medications reviewed and include: Lasix 40 mg daily IV, Protonix, Carafate 1 gram TID and QHS, Precedex gtt, fentanyl gtt, Versed gtt, Zosyn.  Labs reviewed: CBG 101-133, Chloride 96, CO2 36, BUN 27, Phosphorus 4.8.  I/O: 4730 mL UOP yesterday (1.3 mL/kg/hr)  Weight trend: 148.5 kg on 12/26; +13.4 kg from 145.1 kg on 12/23  Enteral Access: 18 Fr. OGT placed 12/23; terminates in proximal gastric body per abdominal x-ray 12/23  TF regimen: Pivot 1.5 Cal at 20 mL/hr + PROSource TF 90 mL 6 times daily per tube  Discussed with RN and on rounds. RD noted patient has not had BM at all during admission. Plan is to change bowel regimen from PRN to scheduled.  Diet Order:   Diet Order    None     EDUCATION NEEDS:   No education needs have been identified at this time  Skin:  Skin Assessment: Reviewed RN Assessment  Last BM:  Unknown/PTA  Height:   Ht Readings from Last 1 Encounters:  09/21/20 5'  10" (1.778 m)   Weight:   Wt Readings from Last 1 Encounters:  09/24/20 (!) 148.5 kg   Ideal Body Weight:  75.5 kg  BMI:  Body mass index is 46.97 kg/m.  Estimated Nutritional Needs:   Kcal:  1595-2030 (11-14 grams/kg)  Protein:  up to 189 grams (2.5 grams/kg IBW)  Fluid:  2-2.3 L/day  Jacklynn Barnacle, MS, RD, LDN Pager number available on Amion

## 2020-09-25 NOTE — Progress Notes (Signed)
Patient's sedation turned off this am, precedex ordered. Patient was able to open eyes during weaning but unable to follow commands. Patient started having shallow, labored breathing and diaphoretic. Per MD placed back on a rate and PRN fentanyl push given. CT of head and chest performed today. Tube feeds changed per orders. Foley intact with adequate urine output. Potassium replaced. Continue to monitor.

## 2020-09-25 NOTE — Progress Notes (Signed)
Patient transported to CT and transported back to ICU room, on the ventilator, with no issues.

## 2020-09-26 DIAGNOSIS — J9601 Acute respiratory failure with hypoxia: Secondary | ICD-10-CM | POA: Diagnosis not present

## 2020-09-26 LAB — CBC
HCT: 48.9 % (ref 39.0–52.0)
HCT: 50.5 % (ref 39.0–52.0)
Hemoglobin: 15.3 g/dL (ref 13.0–17.0)
Hemoglobin: 15.5 g/dL (ref 13.0–17.0)
MCH: 30.7 pg (ref 26.0–34.0)
MCH: 30.2 pg (ref 26.0–34.0)
MCHC: 31.3 g/dL (ref 30.0–36.0)
MCHC: 30.7 g/dL (ref 30.0–36.0)
MCV: 98 fL (ref 80.0–100.0)
MCV: 98.2 fL (ref 80.0–100.0)
Platelets: 149 10*3/uL — ABNORMAL LOW (ref 150–400)
Platelets: 165 10*3/uL (ref 150–400)
RBC: 4.99 MIL/uL (ref 4.22–5.81)
RBC: 5.14 MIL/uL (ref 4.22–5.81)
RDW: 12.7 % (ref 11.5–15.5)
RDW: 12.9 % (ref 11.5–15.5)
WBC: 9.7 10*3/uL (ref 4.0–10.5)
WBC: 9.4 10*3/uL (ref 4.0–10.5)
nRBC: 0 % (ref 0.0–0.2)
nRBC: 0 % (ref 0.0–0.2)

## 2020-09-26 LAB — RENAL FUNCTION PANEL
Albumin: 2.8 g/dL — ABNORMAL LOW (ref 3.5–5.0)
Anion gap: 8 (ref 5–15)
BUN: 27 mg/dL — ABNORMAL HIGH (ref 6–20)
CO2: 38 mmol/L — ABNORMAL HIGH (ref 22–32)
Calcium: 8.8 mg/dL — ABNORMAL LOW (ref 8.9–10.3)
Chloride: 96 mmol/L — ABNORMAL LOW (ref 98–111)
Creatinine, Ser: 0.85 mg/dL (ref 0.61–1.24)
GFR, Estimated: >60 mL/min (ref >60)
Glucose, Bld: 124 mg/dL — ABNORMAL HIGH (ref 70–99)
Phosphorus: 2 mg/dL — ABNORMAL LOW (ref 2.5–4.6)
Potassium: 4.2 mmol/L (ref 3.5–5.1)
Sodium: 142 mmol/L (ref 135–145)

## 2020-09-26 LAB — GLUCOSE, CAPILLARY
Glucose-Capillary: 143 mg/dL — ABNORMAL HIGH (ref 70–99)
Glucose-Capillary: 147 mg/dL — ABNORMAL HIGH (ref 70–99)
Glucose-Capillary: 126 mg/dL — ABNORMAL HIGH (ref 70–99)
Glucose-Capillary: 113 mg/dL — ABNORMAL HIGH (ref 70–99)
Glucose-Capillary: 138 mg/dL — ABNORMAL HIGH (ref 70–99)

## 2020-09-26 LAB — CULTURE, BLOOD (ROUTINE X 2)
Culture: NO GROWTH
Culture: NO GROWTH

## 2020-09-26 LAB — PROCALCITONIN: Procalcitonin: 0.12 ng/mL

## 2020-09-26 LAB — MAGNESIUM: Magnesium: 2.2 mg/dL (ref 1.7–2.4)

## 2020-09-26 LAB — LACTIC ACID, PLASMA: Lactic Acid, Venous: 0.9 mmol/L (ref 0.5–1.9)

## 2020-09-26 MED ORDER — ACETAMINOPHEN 325 MG PO TABS
650.0000 mg | ORAL_TABLET | Freq: Once | ORAL | Status: AC
  Administered 2020-09-26: 650 mg via ORAL
  Filled 2020-09-26: qty 2

## 2020-09-26 MED ORDER — ENOXAPARIN SODIUM 80 MG/0.8ML ~~LOC~~ SOLN
0.5000 mg/kg | SUBCUTANEOUS | Status: DC
  Administered 2020-09-26 – 2020-10-14 (×19): 70 mg via SUBCUTANEOUS
  Filled 2020-09-26 (×20): qty 0.8

## 2020-09-26 MED ORDER — QUETIAPINE FUMARATE 25 MG PO TABS
50.0000 mg | ORAL_TABLET | Freq: Two times a day (BID) | ORAL | Status: DC
  Administered 2020-09-26 – 2020-09-30 (×10): 50 mg via ORAL
  Filled 2020-09-26 (×11): qty 2

## 2020-09-26 MED ORDER — ACETAMINOPHEN 325 MG PO TABS
ORAL_TABLET | ORAL | Status: AC
  Filled 2020-09-26: qty 2

## 2020-09-26 MED ORDER — ACETAMINOPHEN 325 MG PO TABS
650.0000 mg | ORAL_TABLET | Freq: Four times a day (QID) | ORAL | Status: DC | PRN
  Administered 2020-09-26 – 2020-10-05 (×7): 650 mg via ORAL
  Filled 2020-09-26 (×6): qty 2

## 2020-09-26 NOTE — Progress Notes (Signed)
Dr. Berneice Heinrich into see patient this am. Patient able to wiggle toes, follow simple commands. MD placed patient on spontaneous low tidal volumes noted with agitation. Per orders re-sedated patient. Seroquel ordered bid to help patient with agitation and anxiety during weaning trials. Foley intact with adequate urine output. Patient tolerating tube feeds. Continue to monitor.

## 2020-09-26 NOTE — Progress Notes (Signed)
CRITICAL CARE NOTE    Name: Eddie Chapman MRN: [id number] DOB: Aug 31, 1961     LOS: 5   SUBJECTIVE FINDINGS & SIGNIFICANT EVENTS    Patient description:  59 yo M with hx of cerebral aneurysm, PVD, HFrEF, pulmonary htn, morbid obesity, OSA, GERD, hx of TBI, dyslipidemia who came in after girlfriend found him to be unresponsive.  He was found to be severely hypoxemic in circulatory shock with left lung infiltrate on CXR. Labwork consistent with sepsis.   Events:  09/22/20- patient is off vasopressors.  He is weaning on FIO2.  Family at bedside (girlfriend).  Patient is weaned to 40% FIO2. 09/23/20- patient failed SBT today. 09/25/20-patient with continued tenacious secretions and patient also with altered mentation.  Started Precedex in hopes to be able to wean Versed.  As patient extremely altered and difficult to arouse.  CT head and CT chest obtained 09/26/20-Pateint following commands intermittently but agitated.  SBT attempted this morning and failed. Patient was taking TV of 200 on 10/5. RR increased to 40. Placed back on rate and sedaiton increased.   Lines/tubes :  PIVx2  Microbiology/Sepsis markers: Results for orders placed or performed during the hospital encounter of 09/21/20  Resp Panel by RT-PCR (Flu A&B, Covid) Nasopharyngeal Swab     Status: None   Collection Time: 09/21/20  2:12 PM   Specimen: Nasopharyngeal Swab; Nasopharyngeal(NP) swabs in vial transport medium  Result Value Ref Range Status   SARS Coronavirus 2 by RT PCR NEGATIVE NEGATIVE Final    Comment: (NOTE) SARS-CoV-2 target nucleic acids are NOT DETECTED.  The SARS-CoV-2 RNA is generally detectable in upper respiratory specimens during the acute phase of infection. The lowest concentration of SARS-CoV-2 viral copies this assay can  detect is 138 copies/mL. A negative result does not preclude SARS-Cov-2 infection and should not be used as the sole basis for treatment or other patient management decisions. A negative result may occur with  improper specimen collection/handling, submission of specimen other than nasopharyngeal swab, presence of viral mutation(s) within the areas targeted by this assay, and inadequate number of viral copies(<138 copies/mL). A negative result must be combined with clinical observations, patient history, and epidemiological information. The expected result is Negative.  Fact Sheet for Patients:  EntrepreneurPulse.com.au  Fact Sheet for Healthcare Providers:  IncredibleEmployment.be  This test is no t yet approved or cleared by the [place] FDA and  has been authorized for detection and/or diagnosis of SARS-CoV-2 by FDA under an Emergency Use Authorization (EUA). This EUA will remain  in effect (meaning this test can be used) for the duration of the COVID-19 declaration under Section 564(b)(1) of the Act, 21 U.S.C.section 360bbb-3(b)(1), unless the authorization is terminated  or revoked sooner.       Influenza A by PCR NEGATIVE NEGATIVE Final   Influenza B by PCR NEGATIVE NEGATIVE Final    Comment: (NOTE) The Xpert Xpress SARS-CoV-2/FLU/RSV plus assay is intended as an aid in the diagnosis of influenza from Nasopharyngeal swab specimens and should not be used as a sole basis for treatment. Nasal washings and aspirates are unacceptable for Xpert Xpress SARS-CoV-2/FLU/RSV testing.  Fact Sheet for Patients: EntrepreneurPulse.com.au  Fact Sheet for Healthcare Providers: IncredibleEmployment.be  This test is not yet approved or cleared by the [place] FDA and has been authorized for detection and/or diagnosis of SARS-CoV-2 by FDA under an Emergency Use Authorization (EUA). This EUA will remain in  effect (meaning this test can be used) for the duration of  the COVID-19 declaration under Section 564(b)(1) of the Act, 21 U.S.C. section 360bbb-3(b)(1), unless the authorization is terminated or revoked.  Performed at [facility], [address] [place], [place] [address]   Culture, blood (routine x 2)     Status: None   Collection Time: 09/21/20  2:45 PM   Specimen: BLOOD  Result Value Ref Range Status   Specimen Description BLOOD BLOOD RIGHT FOREARM  Final   Special Requests   Final    BOTTLES DRAWN AEROBIC AND ANAEROBIC Blood Culture results may not be optimal due to an inadequate volume of blood received in culture bottles   Culture   Final    NO GROWTH 5 DAYS Performed at [facility], [address] [place], [place] [address]    Report Status 09/26/2020 FINAL  Final  Culture, blood (routine x 2)     Status: None   Collection Time: 09/21/20  2:45 PM   Specimen: BLOOD  Result Value Ref Range Status   Specimen Description BLOOD RIGHT ANTECUBITAL  Final   Special Requests   Final    BOTTLES DRAWN AEROBIC AND ANAEROBIC Blood Culture results may not be optimal due to an excessive volume of blood received in culture bottles   Culture   Final    NO GROWTH 5 DAYS Performed at [facility], [address] [place], [place] [address]    Report Status 09/26/2020 FINAL  Final  Culture, respiratory (non-expectorated)     Status: None   Collection Time: 09/21/20  3:14 PM   Specimen: Tracheal Aspirate; Respiratory  Result Value Ref Range Status   Specimen Description   Final    TRACHEAL ASPIRATE Performed at [facility], [address], [place], [place] [address]    Special Requests   Final    NONE Performed at [facility], [address] [place], [place] [address]    Gram Stain   Final    FEW WBC PRESENT,BOTH PMN AND MONONUCLEAR FEW GRAM POSITIVE COCCI IN PAIRS IN CHAINS FEW GRAM NEGATIVE RODS    Culture   Final    FEW  Normal respiratory flora-no Staph aureus or Pseudomonas seen Performed at [facility], [place] [address], [place], [place] [address]    Report Status 09/24/2020 FINAL  Final  MRSA PCR Screening     Status: None   Collection Time: 09/21/20  4:15 PM   Specimen: Nasal Mucosa; Nasopharyngeal  Result Value Ref Range Status   MRSA by PCR NEGATIVE NEGATIVE Final    Comment:        The GeneXpert MRSA Assay (FDA approved for NASAL specimens only), is one component of a comprehensive MRSA colonization surveillance program. It is not intended to diagnose MRSA infection nor to guide or monitor treatment for MRSA infections. Performed at [facility], [address] [place], [place] [address]   Culture, respiratory     Status: None   Collection Time: 09/22/20 11:40 AM   Specimen: Tracheal Aspirate; Respiratory  Result Value Ref Range Status   Specimen Description   Final    TRACHEAL ASPIRATE Performed at [facility], [address] [place], [place] [address]    Special Requests   Final    NONE Performed at [facility], [address] [place], [place] [address]    Gram Stain   Final    ABUNDANT WBC PRESENT, PREDOMINANTLY PMN NO ORGANISMS SEEN    Culture   Final  RARE Normal respiratory flora-no Staph aureus or Pseudomonas seen Performed at [place] [address] [place], [place] [address]    Report Status 09/25/2020 FINAL  Final    Anti-infectives:  Anti-infectives (From admission, onward)   Start     Dose/Rate Route Frequency Ordered Stop   09/21/20 1845  piperacillin-tazobactam (ZOSYN) IVPB 3.375 g        3.375 g 12.5 mL/hr over 240 Minutes Intravenous Every 8 hours 09/21/20 1805     09/21/20 1530  vancomycin (VANCOREADY) IVPB 1500 mg/300 mL        1,500 mg 150 mL/hr over 120 Minutes Intravenous  Once 09/21/20 1425 09/21/20 2051   09/21/20 1430  ceFEPIme (MAXIPIME) 2 g in sodium chloride 0.9 % 100 mL IVPB        2 g 200  mL/hr over 30 Minutes Intravenous  Once 09/21/20 1416 09/21/20 1606   09/21/20 1430  vancomycin (VANCOCIN) IVPB 1000 mg/200 mL premix        1,000 mg 200 mL/hr over 60 Minutes Intravenous  Once 09/21/20 1416 09/21/20 1714   09/21/20 1430  vancomycin (VANCOREADY) IVPB 1500 mg/300 mL  Status:  Discontinued        1,500 mg 150 mL/hr over 120 Minutes Intravenous  Once 09/21/20 1425 09/21/20 1425        PAST MEDICAL HISTORY   Past Medical History:  Diagnosis Date  . Adenomatous colon polyp   . Depression   . Ear drum perforation    left x2   . GERD (gastroesophageal reflux disease)   . Hyperlipidemia   . Hypertension   . T8 vertebral fracture [facility])      SURGICAL HISTORY   Past Surgical History:  Procedure Laterality Date  . CARPAL TUNNEL RELEASE     left hand  . COLONOSCOPY  09/18/2009, 09/15/2014  . COLONOSCOPY WITH PROPOFOL N/A 12/29/2017   Procedure: COLONOSCOPY WITH PROPOFOL;  Surgeon: Manya Silvas, MD;  Location: [facility] ENDOSCOPY;  Service: Endoscopy;  Laterality: N/A;  . EYE SURGERY    . FRACTURE SURGERY    . HERNIA REPAIR    . LIPOMA EXCISION    . SPINE SURGERY       FAMILY HISTORY   Family History  Problem Relation Age of Onset  . Cancer Mother   . Varicose Veins Mother      SOCIAL HISTORY   Social History   Tobacco Use  . Smoking status: Never Smoker  . Smokeless tobacco: Never Used  Vaping Use  . Vaping Use: Never used  Substance Use Topics  . Alcohol use: Never  . Drug use: Never     MEDICATIONS   Current Medication:  Current Facility-Administered Medications:  .  0.9 %  sodium chloride infusion, 250 mL, Intravenous, Continuous, Bradly Bienenstock, NP, Stopped at 09/25/20 0532 .  chlorhexidine gluconate (MEDLINE KIT) (PERIDEX) 0.12 % solution 15 mL, 15 mL, Mouth Rinse, BID, [name], Fuad, MD, 15 mL at 09/25/20 1934 .  Chlorhexidine Gluconate Cloth 2 % PADS 6 each, 6 each, Topical, Daily, Bradly Bienenstock, NP, 6 each at 09/25/20  2100 .  dexmedetomidine (PRECEDEX) 400 MCG/100ML (4 mcg/mL) infusion, 0.4-1.2 mcg/kg/hr, Intravenous, Titrated, Tyna Jaksch, MD, Last Rate: 44.6 mL/hr at 09/26/20 0705, 1.2 mcg/kg/hr at 09/26/20 0705 .  docusate (COLACE) 50 MG/5ML liquid 200 mg, 200 mg, Per Tube, BID, [name], RPH, 200 mg at 09/25/20 2100 .  docusate sodium (COLACE) capsule 100 mg, 100 mg, Oral, BID PRN,  Ottie Glazier, MD .  feeding supplement (PROSource TF) liquid 90 mL, 90 mL, Per Tube, TID, Tyna Jaksch, MD, 90 mL at 09/25/20 2100 .  feeding supplement (VITAL HIGH PROTEIN) liquid 1,000 mL, 1,000 mL, Per Tube, Continuous, Tyna Jaksch, MD, Last Rate: 60 mL/hr at 09/25/20 1543, 1,000 mL at 09/25/20 1543 .  fentaNYL (SUBLIMAZE) injection 100 mcg, 100 mcg, Intravenous, Q1H PRN, Tyna Jaksch, MD, 100 mcg at 09/25/20 2321 .  fentaNYL 2565mg in NS 2535m(1056mml) infusion-PREMIX, 0-400 mcg/hr, Intravenous, Continuous, Siadecki, SebFelix AhmadiD, Last Rate: 5 mL/hr at 09/25/20 2327, 50 mcg/hr at 09/25/20 2327 .  furosemide (LASIX) injection 40 mg, 40 mg, Intravenous, Daily, AleLanney Ginsuad, MD, 40 mg at 09/25/20 0855 .  heparin injection 5,000 Units, 5,000 Units, Subcutaneous, Q8H, AleOttie GlazierD, 5,000 Units at 09/26/20 0500 .  ipratropium-albuterol (DUONEB) 0.5-2.5 (3) MG/3ML nebulizer solution 3 mL, 3 mL, Nebulization, Q6H, AleLanney Ginsuad, MD, 3 mL at 09/26/20 0119 .  LORazepam (ATIVAN) injection 2 mg, 2 mg, Intravenous, Q3H PRN, AleOttie GlazierD, 2 mg at 09/25/20 2103 .  MEDLINE mouth rinse, 15 mL, Mouth Rinse, 10 times per day, AleOttie GlazierD, 15 mL at 09/26/20 0505 .  midazolam (VERSED) 50 mg/50 mL (1 mg/mL) premix infusion, 0.5-10 mg/hr, Intravenous, Continuous, SiaArta SilenceD, Stopped at 09/25/20 1059 .  pantoprazole sodium (PROTONIX) 40 mg/20 mL oral suspension 40 mg, 40 mg, Per Tube, Daily, GruDallie PilesPH, 40 mg at 09/25/20 0855 .  piperacillin-tazobactam (ZOSYN)  IVPB 3.375 g, 3.375 g, Intravenous, Q8H, CunBenn MoulderPH, Last Rate: 12.5 mL/hr at 09/26/20 0500, 3.375 g at 09/26/20 0500 .  polyethylene glycol (MIRALAX / GLYCOLAX) packet 17 g, 17 g, Oral, Daily PRN, AleLanney Ginsuad, MD .  polyethylene glycol (MIRALAX / GLYCOLAX) packet 17 g, 17 g, Per Tube, Daily, GruDallie PilesPH, 17 g at 09/25/20 1642 .  propofol (DIPRIVAN) 1000 MG/100ML infusion, 5-80 mcg/kg/min, Intravenous, Titrated, KeeDarel Hong NP, Last Rate: 8.91 mL/hr at 09/26/20 0706, 10 mcg/kg/min at 09/26/20 0706 .  sucralfate (CARAFATE) 1 GM/10ML suspension 1 g, 1 g, Per Tube, TID WC & HS, AleLanney Ginsuad, MD, 1 g at 09/25/20 2124    ALLERGIES   Divalproex sodium and Valproic acid    REVIEW OF SYSTEMS     Unable to obtain due to unresponsive state in critically ill state  PHYSICAL EXAMINATION   Vital Signs: Temp:  [98.42 F (36.9 C)-99.86 F (37.7 C)] 99.86 F (37.7 C) (12/28 0700) Pulse Rate:  [61-102] 80 (12/28 0700) Resp:  [15-33] 20 (12/28 0700) BP: (109-166)/(66-125) 146/94 (12/28 0700) SpO2:  [96 %-100 %] 98 % (12/28 0700) FiO2 (%):  [30 %-40 %] 30 % (12/28 0119) Weight:  [141.2 kg] 141.2 kg (12/28 0451)  GENERAL:obese age appropirate. HEAD: Normocephalic, atraumatic.  EYES: Pupils equal, round, reactive to light.  No scleral icterus.  MOUTH: Moist mucosal membrane. NECK: Supple. No thyromegaly. No nodules. No JVD.  PULMONARY: decreased L lung breath sounds, rhonchi bilaterally  CARDIOVASCULAR: S1 and S2. Regular rate and rhythm. No murmurs, rubs, or gallops.  GASTROINTESTINAL: Soft, nontender, non-distended. No masses. Positive bowel sounds. No hepatosplenomegaly.  MUSCULOSKELETAL: No swelling, clubbing, or edema.  NEUROLOGIC: Mild distress due to acute illness SKIN:intact,warm,dry   PERTINENT DATA     Infusions: . sodium chloride Stopped (09/25/20 0532)  . dexmedetomidine (PRECEDEX) IV infusion 1.2 mcg/kg/hr (09/26/20 0705)  . feeding  supplement (VITAL HIGH PROTEIN) 1,000 mL (09/25/20 1543)  . fentaNYL infusion  INTRAVENOUS 50 mcg/hr (09/25/20 2327)  . midazolam Stopped (09/25/20 1059)  . piperacillin-tazobactam (ZOSYN)  IV 3.375 g (09/26/20 0500)  . propofol (DIPRIVAN) infusion 10 mcg/kg/min (09/26/20 0706)   Scheduled Medications: . chlorhexidine gluconate (MEDLINE KIT)  15 mL Mouth Rinse BID  . Chlorhexidine Gluconate Cloth  6 each Topical Daily  . docusate  200 mg Per Tube BID  . feeding supplement (PROSource TF)  90 mL Per Tube TID  . furosemide  40 mg Intravenous Daily  . heparin  5,000 Units Subcutaneous Q8H  . ipratropium-albuterol  3 mL Nebulization Q6H  . mouth rinse  15 mL Mouth Rinse 10 times per day  . pantoprazole sodium  40 mg Per Tube Daily  . polyethylene glycol  17 g Per Tube Daily  . sucralfate  1 g Per Tube TID WC & HS   PRN Medications: docusate sodium, fentaNYL (SUBLIMAZE) injection, LORazepam, polyethylene glycol Hemodynamic parameters:   Intake/Output: 12/27 0701 - 12/28 0700 In: 3708.9 [I.V.:861.8; IV Piggyback:197.1] Out: 1350 [Urine:1350]  Ventilator  Settings: Vent Mode: PRVC FiO2 (%):  [30 %-40 %] 30 % Set Rate:  [15 bmp] 15 bmp Vt Set:  [500 mL] 500 mL PEEP:  [5 cmH20-8 cmH20] 8 cmH20 Pressure Support:  [10 cmH20] 10 cmH20 Plateau Pressure:  [19 cmH20] 19 cmH20    LAB RESULTS:  Basic Metabolic Panel: Recent Labs  Lab 09/22/20 0432 09/23/20 0456 09/24/20 0615 09/25/20 0534 09/26/20 0450  NA 140 138 135 137 142  K 4.8 3.9 4.3 4.7 4.2  CL 97* 98 98 96* 96*  CO2 36* 31 31 36* 38*  GLUCOSE 93 95 96 117* 124*  BUN 20 24* 26* 27* 27*  CREATININE 1.16 1.21 0.98 0.91 0.85  CALCIUM 8.1* 8.0* 8.1* 8.5* 8.8*  MG  --  1.8 2.2 2.1 2.2  PHOS  --  2.7 4.4 4.8* 2.0*   Liver Function Tests: Recent Labs  Lab 09/21/20 1519 09/26/20 0450  AST 33  --   ALT 33  --   ALKPHOS 54  --   BILITOT 0.9  --   PROT 6.1*  --   ALBUMIN 3.1* 2.8*   No results for input(s): LIPASE,  AMYLASE in the last 168 hours. No results for input(s): AMMONIA in the last 168 hours. CBC: Recent Labs  Lab 09/21/20 1327 09/22/20 0432 09/23/20 0456 09/24/20 0615 09/25/20 0534 09/26/20 0450  WBC 12.5* 13.5* 12.2* 9.5 9.6 9.7  NEUTROABS 10.5* 10.6* 9.6* 7.3  --   --   HGB 19.0* 14.9 15.1 14.9 15.0 15.3  HCT 62.8* 48.2 47.5 46.7 49.1 48.9  MCV 100.0 97.6 95.4 96.1 99.0 98.0  PLT 142* 126* 136* 148* 141* 149*   Cardiac Enzymes: No results for input(s): CKTOTAL, CKMB, CKMBINDEX, TROPONINI in the last 168 hours. BNP: Invalid input(s): POCBNP CBG: Recent Labs  Lab 09/25/20 1644 09/25/20 1936 09/25/20 2332 09/26/20 0332 09/26/20 0746  GLUCAP 115* 137* 136* 143* 147*       IMAGING RESULTS:  Imaging: CT HEAD WO CONTRAST  Result Date: 09/25/2020 CLINICAL DATA:  59 year old male with altered mental status. EXAM: CT HEAD WITHOUT CONTRAST TECHNIQUE: Contiguous axial images were obtained from the base of the skull through the vertex without intravenous contrast. COMPARISON:  Head CT dated 09/21/2020. FINDINGS: Brain: There is a large area of old infarct and encephalomalacia involving the right MCA territory. There is no acute intracranial hemorrhage. No mass effect midline shift no extra-axial fluid collection. Vascular: No hyperdense vessel or unexpected calcification.  Skull: Normal. Negative for fracture or focal lesion. Sinuses/Orbits: There is diffuse mucoperiosteal thickening of paranasal sinuses. No air-fluid level. The mastoid air cells are clear. Other: None IMPRESSION: 1. No acute intracranial hemorrhage. 2. 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 Electronically Signed   By: Anner Crete M.D.   On: 09/25/2020 15:36   CT CHEST WO CONTRAST  Result Date: 09/25/2020 CLINICAL DATA:  Mental status change. EXAM: CT CHEST WITHOUT CONTRAST TECHNIQUE: Multidetector CT imaging of the chest was performed following the standard protocol  without IV contrast. COMPARISON:  Chest x-ray 09/24/2020, chest x-ray 09/22/2020. FINDINGS: Lines and tubes: Endotracheal tube terminates 0.5 cm above the carina just proximal to the origin of the left mainstem bronchus. Enteric tube courses below the hemidiaphragm with tip and side port collimated off view. Enteric tube is noted to course within the gastric lumen. Right internal jugular venous catheter with tip terminating in the proximal superior vena cava just proximal to the confluence of the superior vena cava and left brachiocephalic vein. Cardiovascular: Cardiomegaly. No significant pericardial effusion. Limited evaluation of the aortic root due to motion artifact and noncontrast study. Suggestion of an ectatic ascending aorta that is completely evaluated on this noncontrast study with ascending aorta measuring at least up to 4 cm. Descending thoracic aorta appears to be normal in caliber. Mild atherosclerotic plaque of the thoracic aorta. At least mild three-vessel coronary artery calcifications. The main left and right pulmonary arteries are enlarged in caliber. Mediastinum/Nodes: Enlarged 1 cm precarinal lymph node. Otherwise prominent mediastinal lymph nodes. No gross hilar adenopathy, noting limited sensitivity for the detection of hilar adenopathy on this noncontrast study. No axillary lymphadenopathy. The esophagus and trachea are unremarkable. The thyroid is partially visualized and grossly unremarkable. Lungs/Pleura: Expiratory phase of respiration. Consolidation of the left lower lobe. No definite evaluation of intraparenchymal abscess formation; however, please note markedly limited evaluation on this noncontrast study no cavitary lesion identified. Passive atelectasis of the right lower lobe. No pulmonary nodule or mass within the aerated lungs. Trace bilateral, right greater than left, pleural effusions. No pneumothorax. Upper Abdomen: A 3.5 cm right adrenal gland mass is noted with a density of  80 Hounsfield units. No other acute abnormality. Musculoskeletal: No abdominal wall hernia or abnormality No suspicious lytic or blastic osseous lesions. No acute displaced fracture. Old healed left rib fractures. Multilevel degenerative changes of the spine with several mid to lowerthoracic vertebral bodies demonstrating height loss. The T9 vertebral body demonstrates at least 80% vertebral body height loss. IMPRESSION: 1. Endotracheal tube located at the carina, just proximal to the origin of the left mainstem bronchus. This study demonstrates in expiratory phase of respiration, therefore recommend retraction by no more than 2 cm. 2. Left lower lobe consolidation with associated bilateral trace pleural effusions. Associated 1 cm precarinal lymph node that is likely reactive in etiology. No definite abscess formation identified; however, limited evaluation on this noncontrast study. An underlying pulmonary mass cannot be excluded. Followup PA and lateral chest X-ray is recommended in 3-4 weeks following therapy to ensure resolution and exclude underlying malignancy. 3. A indeterminate 3.5 cm right adrenal gland mass. Recommend further evaluation with dedicated CT adrenal protocol. 4. Suggestion of an ectatic ascending aorta with limited evaluation on this noncontrast study. Aortic aneurysm NOS (ICD10-I71.9). Aortic Atherosclerosis (ICD10-I70.0). 5. Other imaging findings of potential clinical significance: At least mild four-vessel coronary artery calcifications. Cardiomegaly. Multilevel mid to lower thoracic chronic appearing compression fractures with greater than 80%  height loss of the T9 vertebral body. These results were called by telephone at the time of interpretation on 09/25/2020 at 3:41 pm to provider [facility] , who verbally acknowledged these results. Electronically Signed   By: Iven Finn M.D.   On: 09/25/2020 15:55   DG Chest Port 1 View  Result Date: 09/25/2020 CLINICAL DATA:   Respiratory failure EXAM: PORTABLE CHEST 1 VIEW COMPARISON:  09/24/2020 FINDINGS: Endotracheal tube is again seen, 5.0 cm above the carina. Nasogastric tube extends into the upper abdomen beyond the margin of the examination. Right internal jugular central venous catheter tip overlies the right innominate vein. Lung volumes are small, but pulmonary insufflation remain stable since prior examination. Left basilar consolidation persists, unchanged. No pneumothorax. Small left pleural effusion is difficult to exclude. Cardiac size is mildly enlarged, unchanged. IMPRESSION: Stable support lines and tubes. Pulmonary hypoinflation, unchanged. Stable left basilar consolidation. Electronically Signed   By: Fidela Salisbury MD   On: 09/25/2020 23:36   _0 @ CT HEAD WO CONTRAST  Result Date: 09/25/2020 CLINICAL DATA:  59 year old male with altered mental status. EXAM: CT HEAD WITHOUT CONTRAST TECHNIQUE: Contiguous axial images were obtained from the base of the skull through the vertex without intravenous contrast. COMPARISON:  Head CT dated 09/21/2020. FINDINGS: Brain: There is a large area of old infarct and encephalomalacia involving the right MCA territory. There is no acute intracranial hemorrhage. No mass effect midline shift no extra-axial fluid collection. Vascular: No hyperdense vessel or unexpected calcification. Skull: Normal. Negative for fracture or focal lesion. Sinuses/Orbits: There is diffuse mucoperiosteal thickening of paranasal sinuses. No air-fluid level. The mastoid air cells are clear. Other: None IMPRESSION: 1. No acute intracranial hemorrhage. 2. 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 Electronically Signed   By: Anner Crete M.D.   On: 09/25/2020 15:36   CT CHEST WO CONTRAST  Result Date: 09/25/2020 CLINICAL DATA:  Mental status change. EXAM: CT CHEST WITHOUT CONTRAST TECHNIQUE: Multidetector CT imaging of the chest was performed following  the standard protocol without IV contrast. COMPARISON:  Chest x-ray 09/24/2020, chest x-ray 09/22/2020. FINDINGS: Lines and tubes: Endotracheal tube terminates 0.5 cm above the carina just proximal to the origin of the left mainstem bronchus. Enteric tube courses below the hemidiaphragm with tip and side port collimated off view. Enteric tube is noted to course within the gastric lumen. Right internal jugular venous catheter with tip terminating in the proximal superior vena cava just proximal to the confluence of the superior vena cava and left brachiocephalic vein. Cardiovascular: Cardiomegaly. No significant pericardial effusion. Limited evaluation of the aortic root due to motion artifact and noncontrast study. Suggestion of an ectatic ascending aorta that is completely evaluated on this noncontrast study with ascending aorta measuring at least up to 4 cm. Descending thoracic aorta appears to be normal in caliber. Mild atherosclerotic plaque of the thoracic aorta. At least mild three-vessel coronary artery calcifications. The main left and right pulmonary arteries are enlarged in caliber. Mediastinum/Nodes: Enlarged 1 cm precarinal lymph node. Otherwise prominent mediastinal lymph nodes. No gross hilar adenopathy, noting limited sensitivity for the detection of hilar adenopathy on this noncontrast study. No axillary lymphadenopathy. The esophagus and trachea are unremarkable. The thyroid is partially visualized and grossly unremarkable. Lungs/Pleura: Expiratory phase of respiration. Consolidation of the left lower lobe. No definite evaluation of intraparenchymal abscess formation; however, please note markedly limited evaluation on this noncontrast study no cavitary lesion identified. Passive atelectasis of the right lower lobe. No  pulmonary nodule or mass within the aerated lungs. Trace bilateral, right greater than left, pleural effusions. No pneumothorax. Upper Abdomen: A 3.5 cm right adrenal gland mass is  noted with a density of 80 Hounsfield units. No other acute abnormality. Musculoskeletal: No abdominal wall hernia or abnormality No suspicious lytic or blastic osseous lesions. No acute displaced fracture. Old healed left rib fractures. Multilevel degenerative changes of the spine with several mid to lowerthoracic vertebral bodies demonstrating height loss. The T9 vertebral body demonstrates at least 80% vertebral body height loss. IMPRESSION: 1. Endotracheal tube located at the carina, just proximal to the origin of the left mainstem bronchus. This study demonstrates in expiratory phase of respiration, therefore recommend retraction by no more than 2 cm. 2. Left lower lobe consolidation with associated bilateral trace pleural effusions. Associated 1 cm precarinal lymph node that is likely reactive in etiology. No definite abscess formation identified; however, limited evaluation on this noncontrast study. An underlying pulmonary mass cannot be excluded. Followup PA and lateral chest X-ray is recommended in 3-4 weeks following therapy to ensure resolution and exclude underlying malignancy. 3. A indeterminate 3.5 cm right adrenal gland mass. Recommend further evaluation with dedicated CT adrenal protocol. 4. Suggestion of an ectatic ascending aorta with limited evaluation on this noncontrast study. Aortic aneurysm NOS (ICD10-I71.9). Aortic Atherosclerosis (ICD10-I70.0). 5. Other imaging findings of potential clinical significance: At least mild four-vessel coronary artery calcifications. Cardiomegaly. Multilevel mid to lower thoracic chronic appearing compression fractures with greater than 80% height loss of the T9 vertebral body. These results were called by telephone at the time of interpretation on 09/25/2020 at 3:41 pm to provider [facility] , who verbally acknowledged these results. Electronically Signed   By: Iven Finn M.D.   On: 09/25/2020 15:55   DG Chest Port 1 View  Result Date:  09/25/2020 CLINICAL DATA:  Respiratory failure EXAM: PORTABLE CHEST 1 VIEW COMPARISON:  09/24/2020 FINDINGS: Endotracheal tube is again seen, 5.0 cm above the carina. Nasogastric tube extends into the upper abdomen beyond the margin of the examination. Right internal jugular central venous catheter tip overlies the right innominate vein. Lung volumes are small, but pulmonary insufflation remain stable since prior examination. Left basilar consolidation persists, unchanged. No pneumothorax. Small left pleural effusion is difficult to exclude. Cardiac size is mildly enlarged, unchanged. IMPRESSION: Stable support lines and tubes. Pulmonary hypoinflation, unchanged. Stable left basilar consolidation. Electronically Signed   By: Fidela Salisbury MD   On: 09/25/2020 23:36      CT from 09/25/20   Other: None  IMPRESSION: 1. No acute intracranial hemorrhage. 2. 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   IMPRESSION: 1. Endotracheal tube located at the carina, just proximal to the origin of the left mainstem bronchus. This study demonstrates in expiratory phase of respiration, therefore recommend retraction by no more than 2 cm. 2. Left lower lobe consolidation with associated bilateral trace pleural effusions. Associated 1 cm precarinal lymph node that is likely reactive in etiology. No definite abscess formation identified; however, limited evaluation on this noncontrast study. An underlying pulmonary mass cannot be excluded. Followup PA and lateral chest X-ray is recommended in 3-4 weeks following therapy to ensure resolution and exclude underlying malignancy. 3. A indeterminate 3.5 cm right adrenal gland mass. Recommend further evaluation with dedicated CT adrenal protocol. 4. Suggestion of an ectatic ascending aorta with limited evaluation on this noncontrast study. Aortic aneurysm NOS (ICD10-I71.9). Aortic Atherosclerosis (ICD10-I70.0). 5. Other  imaging findings of potential  clinical significance: At least mild four-vessel coronary artery calcifications. Cardiomegaly. Multilevel mid to lower thoracic chronic appearing compression fractures with greater than 80% height loss of the T9 vertebral body.  These results were called by telephone at the time of interpretation on 09/25/2020 at 3:41 pm to provider [facility] , who verbally acknowledged these results.    Electronically Signed   By: Anner Crete M.D.   On: 09/25/2020 15:36  ASSESSMENT AND PLAN    -Multidisciplinary rounds held today  Acute Hypoxic Respiratory Failure -due to left lung pneumonia -there is possible broken and displaced ribs on right 4/5 , as well as consolidated dense LLL infiltrate- will characterize with CT chest -continue Bronchodilator Therapy -Wean Fio2 and PEEP as tolerated -will perform SAT/SBT when respiratory parameters are met -On Zosyn will complete 7-day course today day 5 -tracheal aspirate -+ polymicrobial growth  -Have ordered for pulmonary hygiene to be performed every 4 hours -CT scan performed and notable for LLL PNA. No abscess or complication -Will start oral sedation to help wean and help with agitation. In hopes to achieve better weaning of sedation and vent   Septic shock   - due to left lung consolidated pneumonia  -12/25- shock physiology resolved -Zosyn for 7 days   Renal Failure-acute stage 2 -most likely due to ATN -follow chem 7 -follow UO -continue Foley Catheter-assess need daily   Aortic ectasia -Will need to be followed up outpatient with contrasted study per radiology  ID -continue IV abx as prescibed -follow up cultures  GI/Nutrition GI PROPHYLAXIS as indicated DIET-->TF's as tolerated Constipation protocol as indicated  ENDO - ICU hypoglycemic\Hyperglycemia protocol -check FSBS per protocol   ELECTROLYTES -follow labs as needed -replace as needed -pharmacy  consultation   DVT/GI PRX ordered -SCDs  TRANSFUSIONS AS NEEDED MONITOR FSBS ASSESS the need for LABS as needed   Critical care provider statement:    Critical care time (minutes):  35   Critical care time was exclusive of:  Separately billable procedures and treating other patients   Critical care was necessary to treat or prevent imminent or life-threatening deterioration of the following conditions:  septic shock, acute hypoxemic respiratory failure, left lung consolidated pneumonia   Critical care was time spent personally by me on the following activities:  Development of treatment plan with patient or surrogate, discussions with consultants, evaluation of patient's response to treatment, examination of patient, obtaining history from patient or surrogate, ordering and performing treatments and interventions, ordering and review of laboratory studies and re-evaluation of patient's condition.  I assumed direction of critical care for this patient from another provider in my specialty: no    This document was prepared using Dragon voice recognition software and may include unintentional dictation errors.                                                                              Newell Coral DO Internal Medicine/Pediatrics Pulmonary and Critical Care Fellow PGY-7

## 2020-09-27 ENCOUNTER — Inpatient Hospital Stay: Payer: PPO

## 2020-09-27 DIAGNOSIS — J9601 Acute respiratory failure with hypoxia: Secondary | ICD-10-CM | POA: Diagnosis not present

## 2020-09-27 LAB — BASIC METABOLIC PANEL
Anion gap: 9 (ref 5–15)
BUN: 28 mg/dL — ABNORMAL HIGH (ref 6–20)
CO2: 37 mmol/L — ABNORMAL HIGH (ref 22–32)
Calcium: 8.9 mg/dL (ref 8.9–10.3)
Chloride: 94 mmol/L — ABNORMAL LOW (ref 98–111)
Creatinine, Ser: 0.79 mg/dL (ref 0.61–1.24)
GFR, Estimated: >60 mL/min (ref >60)
Glucose, Bld: 153 mg/dL — ABNORMAL HIGH (ref 70–99)
Potassium: 4 mmol/L (ref 3.5–5.1)
Sodium: 140 mmol/L (ref 135–145)

## 2020-09-27 LAB — CBC
HCT: 49.8 % (ref 39.0–52.0)
Hemoglobin: 15.8 g/dL (ref 13.0–17.0)
MCH: 31 pg (ref 26.0–34.0)
MCHC: 31.7 g/dL (ref 30.0–36.0)
MCV: 97.8 fL (ref 80.0–100.0)
Platelets: 160 10*3/uL (ref 150–400)
RBC: 5.09 MIL/uL (ref 4.22–5.81)
RDW: 13 % (ref 11.5–15.5)
WBC: 9.2 10*3/uL (ref 4.0–10.5)
nRBC: 0 % (ref 0.0–0.2)

## 2020-09-27 LAB — GLUCOSE, CAPILLARY
Glucose-Capillary: 118 mg/dL — ABNORMAL HIGH (ref 70–99)
Glucose-Capillary: 121 mg/dL — ABNORMAL HIGH (ref 70–99)
Glucose-Capillary: 125 mg/dL — ABNORMAL HIGH (ref 70–99)
Glucose-Capillary: 135 mg/dL — ABNORMAL HIGH (ref 70–99)
Glucose-Capillary: 81 mg/dL (ref 70–99)
Glucose-Capillary: 83 mg/dL (ref 70–99)
Glucose-Capillary: 91 mg/dL (ref 70–99)

## 2020-09-27 MED ORDER — LABETALOL HCL 5 MG/ML IV SOLN
10.0000 mg | INTRAVENOUS | Status: DC | PRN
  Administered 2020-10-03 – 2020-10-04 (×6): 10 mg via INTRAVENOUS
  Filled 2020-09-27 (×5): qty 4

## 2020-09-27 NOTE — Progress Notes (Signed)
CRITICAL CARE NOTE    Name: Eddie Chapman MRN: [id number] DOB: May 31, 1961     LOS: 6   SUBJECTIVE FINDINGS & SIGNIFICANT EVENTS    Patient description:  59 yo M with hx of cerebral aneurysm, PVD, HFrEF, pulmonary htn, morbid obesity, OSA, GERD, hx of TBI, dyslipidemia who came in after girlfriend found him to be unresponsive.  He was found to be severely hypoxemic in circulatory shock with left lung infiltrate on CXR. Labwork consistent with sepsis.   Events:  09/22/20- patient is off vasopressors.  He is weaning on FIO2.  Family at bedside (girlfriend).  Patient is weaned to 40% FIO2. 09/23/20- patient failed SBT today. 09/25/20-patient with continued tenacious secretions and patient also with altered mentation.  Started Precedex in hopes to be able to wean Versed.  As patient extremely altered and difficult to arouse.  CT head and CT chest obtained 09/26/20-Pateint following commands intermittently but agitated.  SBT attempted this morning and failed. Patient was taking TV of 200 on 10/5. RR increased to 40. Placed back on rate and sedaiton increased.  09/27/2020-patient attempted another spontaneous breathing trial.  Patient becomes extremely agitated.  Patient takes shallow breaths and has high respiratory rate.  Lines/tubes :  PIVx2  Microbiology/Sepsis markers: Results for orders placed or performed during the hospital encounter of 09/21/20  Resp Panel by RT-PCR (Flu A&B, Covid) Nasopharyngeal Swab     Status: None   Collection Time: 09/21/20  2:12 PM   Specimen: Nasopharyngeal Swab; Nasopharyngeal(NP) swabs in vial transport medium  Result Value Ref Range Status   SARS Coronavirus 2 by RT PCR NEGATIVE NEGATIVE Final    Comment: (NOTE) SARS-CoV-2 target nucleic acids are NOT DETECTED.  The SARS-CoV-2  RNA is generally detectable in upper respiratory specimens during the acute phase of infection. The lowest concentration of SARS-CoV-2 viral copies this assay can detect is 138 copies/mL. A negative result does not preclude SARS-Cov-2 infection and should not be used as the sole basis for treatment or other patient management decisions. A negative result may occur with  improper specimen collection/handling, submission of specimen other than nasopharyngeal swab, presence of viral mutation(s) within the areas targeted by this assay, and inadequate number of viral copies(<138 copies/mL). A negative result must be combined with clinical observations, patient history, and epidemiological information. The expected result is Negative.  Fact Sheet for Patients:  EntrepreneurPulse.com.au  Fact Sheet for Healthcare Providers:  IncredibleEmployment.be  This test is no t yet approved or cleared by the [place] FDA and  has been authorized for detection and/or diagnosis of SARS-CoV-2 by FDA under an Emergency Use Authorization (EUA). This EUA will remain  in effect (meaning this test can be used) for the duration of the COVID-19 declaration under Section 564(b)(1) of the Act, 21 U.S.C.section 360bbb-3(b)(1), unless the authorization is terminated  or revoked sooner.       Influenza A by PCR NEGATIVE NEGATIVE Final   Influenza B by PCR NEGATIVE NEGATIVE Final    Comment: (NOTE) The Xpert Xpress SARS-CoV-2/FLU/RSV plus assay is intended as an aid in the diagnosis of influenza from Nasopharyngeal swab specimens and should not be used as a sole basis for treatment. Nasal washings and aspirates are unacceptable for Xpert Xpress SARS-CoV-2/FLU/RSV testing.  Fact Sheet for Patients: EntrepreneurPulse.com.au  Fact Sheet for Healthcare Providers: IncredibleEmployment.be  This test is not yet approved or cleared by the  [place] FDA and has been authorized for detection and/or diagnosis of SARS-CoV-2 by FDA under  an Emergency Use Authorization (EUA). This EUA will remain in effect (meaning this test can be used) for the duration of the COVID-19 declaration under Section 564(b)(1) of the Act, 21 U.S.C. section 360bbb-3(b)(1), unless the authorization is terminated or revoked.  Performed at [facility], [address] [place], [place] [address]   Culture, blood (routine x 2)     Status: None   Collection Time: 09/21/20  2:45 PM   Specimen: BLOOD  Result Value Ref Range Status   Specimen Description BLOOD BLOOD RIGHT FOREARM  Final   Special Requests   Final    BOTTLES DRAWN AEROBIC AND ANAEROBIC Blood Culture results may not be optimal due to an inadequate volume of blood received in culture bottles   Culture   Final    NO GROWTH 5 DAYS Performed at [facility], [address] [place], [place] [address]    Report Status 09/26/2020 FINAL  Final  Culture, blood (routine x 2)     Status: None   Collection Time: 09/21/20  2:45 PM   Specimen: BLOOD  Result Value Ref Range Status   Specimen Description BLOOD RIGHT ANTECUBITAL  Final   Special Requests   Final    BOTTLES DRAWN AEROBIC AND ANAEROBIC Blood Culture results may not be optimal due to an excessive volume of blood received in culture bottles   Culture   Final    NO GROWTH 5 DAYS Performed at [facility], [address], [place], [place] [address]    Report Status 09/26/2020 FINAL  Final  Culture, respiratory (non-expectorated)     Status: None   Collection Time: 09/21/20  3:14 PM   Specimen: Tracheal Aspirate; Respiratory  Result Value Ref Range Status   Specimen Description   Final    TRACHEAL ASPIRATE Performed at [facility], [address] [place], [place] [address]    Special Requests   Final    NONE Performed at [facility], [address] [place], [place] [address]     Gram Stain   Final    FEW WBC PRESENT,BOTH PMN AND MONONUCLEAR FEW GRAM POSITIVE COCCI IN PAIRS IN CHAINS FEW GRAM NEGATIVE RODS    Culture   Final    FEW Normal respiratory flora-no Staph aureus or Pseudomonas seen Performed at [facility], [place] [address] [place], [place] [address]    Report Status 09/24/2020 FINAL  Final  MRSA PCR Screening     Status: None   Collection Time: 09/21/20  4:15 PM   Specimen: Nasal Mucosa; Nasopharyngeal  Result Value Ref Range Status   MRSA by PCR NEGATIVE NEGATIVE Final    Comment:        The GeneXpert MRSA Assay (FDA approved for NASAL specimens only), is one component of a comprehensive MRSA colonization surveillance program. It is not intended to diagnose MRSA infection nor to guide or monitor treatment for MRSA infections. Performed at [facility], [address] [place],  [address]   Culture, respiratory     Status: None   Collection Time: 09/22/20 11:40 AM   Specimen: Tracheal Aspirate; Respiratory  Result Value Ref Range Status   Specimen Description   Final    TRACHEAL ASPIRATE Performed at [facility], [address], [place],  [address]    Special Requests   Final    NONE Performed at [facility], [address] [place],  [address]    Gram Stain   Final  ABUNDANT WBC PRESENT, PREDOMINANTLY PMN NO ORGANISMS SEEN    Culture   Final    RARE Normal respiratory flora-no Staph aureus or Pseudomonas seen Performed at [place] [address], [place], [place] [address]    Report Status 09/25/2020 FINAL  Final  CULTURE, BLOOD (ROUTINE X 2) w Reflex to ID Panel     Status: None (Preliminary result)   Collection Time: 09/26/20 10:41 PM   Specimen: BLOOD  Result Value Ref Range Status   Specimen Description BLOOD BLOOD RIGHT HAND  Final   Special Requests   Final    BOTTLES DRAWN AEROBIC AND ANAEROBIC Blood Culture results may not be optimal due to an  excessive volume of blood received in culture bottles   Culture   Final    NO GROWTH < 12 HOURS Performed at [facility], [address], [place], [place] [address]    Report Status PENDING  Incomplete  CULTURE, BLOOD (ROUTINE X 2) w Reflex to ID Panel     Status: None (Preliminary result)   Collection Time: 09/26/20 11:57 PM   Specimen: BLOOD  Result Value Ref Range Status   Specimen Description BLOOD RIGHT RADIAL  Final   Special Requests   Final    BOTTLES DRAWN AEROBIC AND ANAEROBIC Blood Culture adequate volume   Culture   Final    NO GROWTH < 12 HOURS Performed at [facility], [address], [place], [place] [address]    Report Status PENDING  Incomplete    Anti-infectives:  Anti-infectives (From admission, onward)   Start     Dose/Rate Route Frequency Ordered Stop   09/21/20 1845  piperacillin-tazobactam (ZOSYN) IVPB 3.375 g        3.375 g 12.5 mL/hr over 240 Minutes Intravenous Every 8 hours 09/21/20 1805     09/21/20 1530  vancomycin (VANCOREADY) IVPB 1500 mg/300 mL        1,500 mg 150 mL/hr over 120 Minutes Intravenous  Once 09/21/20 1425 09/21/20 2051   09/21/20 1430  ceFEPIme (MAXIPIME) 2 g in sodium chloride 0.9 % 100 mL IVPB        2 g 200 mL/hr over 30 Minutes Intravenous  Once 09/21/20 1416 09/21/20 1606   09/21/20 1430  vancomycin (VANCOCIN) IVPB 1000 mg/200 mL premix        1,000 mg 200 mL/hr over 60 Minutes Intravenous  Once 09/21/20 1416 09/21/20 1714   09/21/20 1430  vancomycin (VANCOREADY) IVPB 1500 mg/300 mL  Status:  Discontinued        1,500 mg 150 mL/hr over 120 Minutes Intravenous  Once 09/21/20 1425 09/21/20 1425        PAST MEDICAL HISTORY   Past Medical History:  Diagnosis Date  . Adenomatous colon polyp   . Depression   . Ear drum perforation    left x2   . GERD (gastroesophageal reflux disease)   . Hyperlipidemia   . Hypertension   . T8 vertebral fracture [facility])      SURGICAL HISTORY   Past Surgical  History:  Procedure Laterality Date  . CARPAL TUNNEL RELEASE     left hand  . COLONOSCOPY  09/18/2009, 09/15/2014  . COLONOSCOPY WITH PROPOFOL N/A 12/29/2017   Procedure: COLONOSCOPY WITH PROPOFOL;  Surgeon: Manya Silvas, MD;  Location: [facility] ENDOSCOPY;  Service: Endoscopy;  Laterality: N/A;  . EYE SURGERY    . FRACTURE SURGERY    . HERNIA REPAIR    . LIPOMA EXCISION    .  SPINE SURGERY       FAMILY HISTORY   Family History  Problem Relation Age of Onset  . Cancer Mother   . Varicose Veins Mother      SOCIAL HISTORY   Social History   Tobacco Use  . Smoking status: Never Smoker  . Smokeless tobacco: Never Used  Vaping Use  . Vaping Use: Never used  Substance Use Topics  . Alcohol use: Never  . Drug use: Never     MEDICATIONS   Current Medication:  Current Facility-Administered Medications:  .  0.9 %  sodium chloride infusion, 250 mL, Intravenous, Continuous, Bradly Bienenstock, NP, Stopped at 09/25/20 0532 .  acetaminophen (TYLENOL) tablet 650 mg, 650 mg, Oral, Q6H PRN, Tyna Jaksch, MD, 650 mg at 09/26/20 1818 .  chlorhexidine gluconate (MEDLINE KIT) (PERIDEX) 0.12 % solution 15 mL, 15 mL, Mouth Rinse, BID, [name], Fuad, MD, 15 mL at 09/27/20 0857 .  Chlorhexidine Gluconate Cloth 2 % PADS 6 each, 6 each, Topical, Daily, Bradly Bienenstock, NP, 6 each at 09/26/20 2230 .  dexmedetomidine (PRECEDEX) 400 MCG/100ML (4 mcg/mL) infusion, 0.4-1.2 mcg/kg/hr, Intravenous, Titrated, Tyna Jaksch, MD, Last Rate: 22.3 mL/hr at 09/27/20 1731, 0.6 mcg/kg/hr at 09/27/20 1731 .  docusate (COLACE) 50 MG/5ML liquid 200 mg, 200 mg, Per Tube, BID, [name], RPH, 200 mg at 09/27/20 0901 .  docusate sodium (COLACE) capsule 100 mg, 100 mg, Oral, BID PRN, [name], Fuad, MD .  enoxaparin (LOVENOX) injection 70 mg, 0.5 mg/kg, Subcutaneous, Q24H, Tyna Jaksch, MD, 70 mg at 09/26/20 2156 .  feeding supplement (PROSource TF) liquid 90 mL, 90 mL, Per Tube, TID,  Tyna Jaksch, MD, 90 mL at 09/27/20 1635 .  feeding supplement (VITAL HIGH PROTEIN) liquid 1,000 mL, 1,000 mL, Per Tube, Continuous, Tyna Jaksch, MD, Last Rate: 60 mL/hr at 09/27/20 0913, 1,000 mL at 09/27/20 0913 .  fentaNYL (SUBLIMAZE) injection 100 mcg, 100 mcg, Intravenous, Q1H PRN, Tyna Jaksch, MD, 100 mcg at 09/25/20 2321 .  fentaNYL 2577mg in NS 2545m(1010mml) infusion-PREMIX, 0-400 mcg/hr, Intravenous, Continuous, Siadecki, SebFelix AhmadiD, Last Rate: 15 mL/hr at 09/27/20 1714, 150 mcg/hr at 09/27/20 1714 .  furosemide (LASIX) injection 40 mg, 40 mg, Intravenous, Daily, AleOttie GlazierD, 40 mg at 09/27/20 0901 .  ipratropium-albuterol (DUONEB) 0.5-2.5 (3) MG/3ML nebulizer solution 3 mL, 3 mL, Nebulization, Q6H, Aleskerov, Fuad, MD, 3 mL at 09/27/20 1516 .  labetalol (NORMODYNE) injection 10 mg, 10 mg, Intravenous, Q2H PRN, IzqTyna JakschD .  LORazepam (ATIVAN) injection 2 mg, 2 mg, Intravenous, Q3H PRN, AleOttie GlazierD, 2 mg at 09/27/20 0619 .  MEDLINE mouth rinse, 15 mL, Mouth Rinse, 10 times per day, AleOttie GlazierD, 15 mL at 09/27/20 1733 .  pantoprazole sodium (PROTONIX) 40 mg/20 mL oral suspension 40 mg, 40 mg, Per Tube, Daily, GruDallie PilesPH, 40 mg at 09/27/20 0901 .  piperacillin-tazobactam (ZOSYN) IVPB 3.375 g, 3.375 g, Intravenous, Q8H, CunBenn MoulderPH, Last Rate: 12.5 mL/hr at 09/27/20 1420, 3.375 g at 09/27/20 1420 .  polyethylene glycol (MIRALAX / GLYCOLAX) packet 17 g, 17 g, Oral, Daily PRN, AleLanney Ginsuad, MD .  polyethylene glycol (MIRALAX / GLYCOLAX) packet 17 g, 17 g, Per Tube, Daily, GruDallie PilesPH, 17 g at 09/27/20 0901 .  propofol (DIPRIVAN) 1000 MG/100ML infusion, 5-80 mcg/kg/min, Intravenous, Titrated, KeeDarel Hong NP, Last Rate: 17.82 mL/hr at 09/27/20 1607, 20 mcg/kg/min at 09/27/20 1607 .  QUEtiapine (SEROQUEL)  tablet 50 mg, 50 mg, Oral, BID, Tyna Jaksch, MD, 50 mg at 09/27/20 0901 .   sucralfate (CARAFATE) 1 GM/10ML suspension 1 g, 1 g, Per Tube, TID WC & HS, [name], Fuad, MD, 1 g at 09/27/20 1635    ALLERGIES   Divalproex sodium and Valproic acid    REVIEW OF SYSTEMS     Unable to obtain due to unresponsive state in critically ill state  PHYSICAL EXAMINATION   Vital Signs: Temp:  [97.6 F (36.4 C)-101.84 F (38.8 C)] 99.9 F (37.7 C) (12/29 1636) Pulse Rate:  [51-91] 62 (12/29 1700) Resp:  [15-42] 19 (12/29 1700) BP: (106-163)/(75-121) 126/88 (12/29 1700) SpO2:  [92 %-100 %] 97 % (12/29 1700) FiO2 (%):  [24 %-30 %] 28 % (12/29 1700) Weight:  [145 kg] 145 kg (12/29 0500)  GENERAL:obese age appropirate. HEAD: Normocephalic, atraumatic.  EYES: Pupils equal, round, reactive to light.  No scleral icterus.  MOUTH: Moist mucosal membrane. NECK: Supple. No thyromegaly. No nodules. No JVD.  PULMONARY: decreased L lung breath sounds, rhonchi bilaterally  CARDIOVASCULAR: S1 and S2. Regular rate and rhythm. No murmurs, rubs, or gallops.  GASTROINTESTINAL: Soft, nontender, non-distended. No masses. Positive bowel sounds. No hepatosplenomegaly.  MUSCULOSKELETAL: No swelling, clubbing, or edema.  NEUROLOGIC: Mild distress due to acute illness SKIN:intact,warm,dry   PERTINENT DATA     Infusions: . sodium chloride Stopped (09/25/20 0532)  . dexmedetomidine (PRECEDEX) IV infusion 0.6 mcg/kg/hr (09/27/20 1731)  . feeding supplement (VITAL HIGH PROTEIN) 1,000 mL (09/27/20 0913)  . fentaNYL infusion INTRAVENOUS 150 mcg/hr (09/27/20 1714)  . piperacillin-tazobactam (ZOSYN)  IV 3.375 g (09/27/20 1420)  . propofol (DIPRIVAN) infusion 20 mcg/kg/min (09/27/20 1607)   Scheduled Medications: . chlorhexidine gluconate (MEDLINE KIT)  15 mL Mouth Rinse BID  . Chlorhexidine Gluconate Cloth  6 each Topical Daily  . docusate  200 mg Per Tube BID  . enoxaparin (LOVENOX) injection  0.5 mg/kg Subcutaneous Q24H  . feeding supplement (PROSource TF)  90 mL Per Tube TID  .  furosemide  40 mg Intravenous Daily  . ipratropium-albuterol  3 mL Nebulization Q6H  . mouth rinse  15 mL Mouth Rinse 10 times per day  . pantoprazole sodium  40 mg Per Tube Daily  . polyethylene glycol  17 g Per Tube Daily  . QUEtiapine  50 mg Oral BID  . sucralfate  1 g Per Tube TID WC & HS   PRN Medications: acetaminophen, docusate sodium, fentaNYL (SUBLIMAZE) injection, labetalol, LORazepam, polyethylene glycol Hemodynamic parameters:   Intake/Output: 12/28 0701 - 12/29 0700 In: 519.2 [I.V.:519.2] Out: 9030 [Urine:4550]  Ventilator  Settings: Vent Mode: PRVC FiO2 (%):  [24 %-30 %] 28 % Set Rate:  [15 bmp] 15 bmp Vt Set:  [500 mL] 500 mL PEEP:  [5 cmH20-8 cmH20] 5 cmH20 Pressure Support:  [5 cmH20] 5 cmH20 Plateau Pressure:  [17 SPQ33-00 cmH20] 17 cmH20    LAB RESULTS:  Basic Metabolic Panel: Recent Labs  Lab 09/23/20 0456 09/24/20 0615 09/25/20 0534 09/26/20 0450 09/27/20 0454  NA 138 135 137 142 140  K 3.9 4.3 4.7 4.2 4.0  CL 98 98 96* 96* 94*  CO2 31 31 36* 38* 37*  GLUCOSE 95 96 117* 124* 153*  BUN 24* 26* 27* 27* 28*  CREATININE 1.21 0.98 0.91 0.85 0.79  CALCIUM 8.0* 8.1* 8.5* 8.8* 8.9  MG 1.8 2.2 2.1 2.2  --   PHOS 2.7 4.4 4.8* 2.0*  --    Liver Function Tests: Recent Labs  Lab 09/21/20 1519 09/26/20 0450  AST 33  --   ALT 33  --   ALKPHOS 54  --   BILITOT 0.9  --   PROT 6.1*  --   ALBUMIN 3.1* 2.8*   No results for input(s): LIPASE, AMYLASE in the last 168 hours. No results for input(s): AMMONIA in the last 168 hours. CBC: Recent Labs  Lab 09/21/20 1327 09/22/20 0432 09/23/20 0456 09/24/20 0615 09/25/20 0534 09/26/20 0450 09/26/20 2225 09/27/20 0454  WBC 12.5* 13.5* 12.2* 9.5 9.6 9.7 9.4 9.2  NEUTROABS 10.5* 10.6* 9.6* 7.3  --   --   --   --   HGB 19.0* 14.9 15.1 14.9 15.0 15.3 15.5 15.8  HCT 62.8* 48.2 47.5 46.7 49.1 48.9 50.5 49.8  MCV 100.0 97.6 95.4 96.1 99.0 98.0 98.2 97.8  PLT 142* 126* 136* 148* 141* 149* 165 160    Cardiac Enzymes: No results for input(s): CKTOTAL, CKMB, CKMBINDEX, TROPONINI in the last 168 hours. BNP: Invalid input(s): POCBNP CBG: Recent Labs  Lab 09/27/20 0009 09/27/20 0438 09/27/20 0727 09/27/20 1135 09/27/20 1643  GLUCAP 118* 135* 125* 121* 81       IMAGING RESULTS:  Imaging: US Venous Img Lower Bilateral (DVT)  Result Date: 09/27/2020 CLINICAL DATA:  59 year old male with fever and leg swelling EXAM: BILATERAL LOWER EXTREMITY VENOUS DOPPLER ULTRASOUND TECHNIQUE: Gray-scale sonography with graded compression, as well as color Doppler and duplex ultrasound were performed to evaluate the lower extremity deep venous systems from the level of the common femoral vein and including the common femoral, femoral, profunda femoral, popliteal and calf veins including the posterior tibial, peroneal and gastrocnemius veins when visible. The superficial great saphenous vein was also interrogated. Spectral Doppler was utilized to evaluate flow at rest and with distal augmentation maneuvers in the common femoral, femoral and popliteal veins. COMPARISON:  None. FINDINGS: RIGHT LOWER EXTREMITY Common Femoral Vein: No evidence of thrombus. Normal compressibility, respiratory phasicity and response to augmentation. Saphenofemoral Junction: No evidence of thrombus. Normal compressibility and flow on color Doppler imaging. Profunda Femoral Vein: No evidence of thrombus. Normal compressibility and flow on color Doppler imaging. Femoral Vein: No evidence of thrombus. Normal compressibility, respiratory phasicity and response to augmentation. Popliteal Vein: No evidence of thrombus. Normal compressibility, respiratory phasicity and response to augmentation. Calf Veins: No evidence of thrombus. Normal compressibility and flow on color Doppler imaging. Superficial Great Saphenous Vein: No evidence of thrombus. Normal compressibility and flow on color Doppler imaging. Other Findings:  None. LEFT LOWER  EXTREMITY Common Femoral Vein: No evidence of thrombus. Normal compressibility, respiratory phasicity and response to augmentation. Saphenofemoral Junction: No evidence of thrombus. Normal compressibility and flow on color Doppler imaging. Profunda Femoral Vein: No evidence of thrombus. Normal compressibility and flow on color Doppler imaging. Femoral Vein: No evidence of thrombus. Normal compressibility, respiratory phasicity and response to augmentation. Popliteal Vein: No evidence of thrombus. Normal compressibility, respiratory phasicity and response to augmentation. Calf Veins: No evidence of thrombus. Normal compressibility and flow on color Doppler imaging. Superficial Great Saphenous Vein: No evidence of thrombus. Normal compressibility and flow on color Doppler imaging. Other Findings:  None. IMPRESSION: Sonographic survey of the bilateral lower extremities negative for DVT Electronically Signed   By: Corrie Mckusick D.O.   On: 09/27/2020 12:40   DG Chest Port 1 View  Result Date: 09/25/2020 CLINICAL DATA:  Respiratory failure EXAM: PORTABLE CHEST 1 VIEW COMPARISON:  09/24/2020 FINDINGS: Endotracheal tube is again seen, 5.0 cm above the carina. Nasogastric  tube extends into the upper abdomen beyond the margin of the examination. Right internal jugular central venous catheter tip overlies the right innominate vein. Lung volumes are small, but pulmonary insufflation remain stable since prior examination. Left basilar consolidation persists, unchanged. No pneumothorax. Small left pleural effusion is difficult to exclude. Cardiac size is mildly enlarged, unchanged. IMPRESSION: Stable support lines and tubes. Pulmonary hypoinflation, unchanged. Stable left basilar consolidation. Electronically Signed   By: Fidela Salisbury MD   On: 09/25/2020 23:36   '@PROBHOSP' @ US Venous Img Lower Bilateral (DVT)  Result Date: 09/27/2020 CLINICAL DATA:  59 year old male with fever and leg swelling EXAM: BILATERAL LOWER  EXTREMITY VENOUS DOPPLER ULTRASOUND TECHNIQUE: Gray-scale sonography with graded compression, as well as color Doppler and duplex ultrasound were performed to evaluate the lower extremity deep venous systems from the level of the common femoral vein and including the common femoral, femoral, profunda femoral, popliteal and calf veins including the posterior tibial, peroneal and gastrocnemius veins when visible. The superficial great saphenous vein was also interrogated. Spectral Doppler was utilized to evaluate flow at rest and with distal augmentation maneuvers in the common femoral, femoral and popliteal veins. COMPARISON:  None. FINDINGS: RIGHT LOWER EXTREMITY Common Femoral Vein: No evidence of thrombus. Normal compressibility, respiratory phasicity and response to augmentation. Saphenofemoral Junction: No evidence of thrombus. Normal compressibility and flow on color Doppler imaging. Profunda Femoral Vein: No evidence of thrombus. Normal compressibility and flow on color Doppler imaging. Femoral Vein: No evidence of thrombus. Normal compressibility, respiratory phasicity and response to augmentation. Popliteal Vein: No evidence of thrombus. Normal compressibility, respiratory phasicity and response to augmentation. Calf Veins: No evidence of thrombus. Normal compressibility and flow on color Doppler imaging. Superficial Great Saphenous Vein: No evidence of thrombus. Normal compressibility and flow on color Doppler imaging. Other Findings:  None. LEFT LOWER EXTREMITY Common Femoral Vein: No evidence of thrombus. Normal compressibility, respiratory phasicity and response to augmentation. Saphenofemoral Junction: No evidence of thrombus. Normal compressibility and flow on color Doppler imaging. Profunda Femoral Vein: No evidence of thrombus. Normal compressibility and flow on color Doppler imaging. Femoral Vein: No evidence of thrombus. Normal compressibility, respiratory phasicity and response to augmentation.  Popliteal Vein: No evidence of thrombus. Normal compressibility, respiratory phasicity and response to augmentation. Calf Veins: No evidence of thrombus. Normal compressibility and flow on color Doppler imaging. Superficial Great Saphenous Vein: No evidence of thrombus. Normal compressibility and flow on color Doppler imaging. Other Findings:  None. IMPRESSION: Sonographic survey of the bilateral lower extremities negative for DVT Electronically Signed   By: Corrie Mckusick D.O.   On: 09/27/2020 12:40      CT from 09/25/20   Other: None  IMPRESSION: 1. No acute intracranial hemorrhage. 2. 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   IMPRESSION: 1. Endotracheal tube located at the carina, just proximal to the origin of the left mainstem bronchus. This study demonstrates in expiratory phase of respiration, therefore recommend retraction by no more than 2 cm. 2. Left lower lobe consolidation with associated bilateral trace pleural effusions. Associated 1 cm precarinal lymph node that is likely reactive in etiology. No definite abscess formation identified; however, limited evaluation on this noncontrast study. An underlying pulmonary mass cannot be excluded. Followup PA and lateral chest X-ray is recommended in 3-4 weeks following therapy to ensure resolution and exclude underlying malignancy. 3. A indeterminate 3.5 cm right adrenal gland mass. Recommend further evaluation with dedicated CT adrenal protocol. 4. Suggestion of an ectatic  ascending aorta with limited evaluation on this noncontrast study. Aortic aneurysm NOS (ICD10-I71.9). Aortic Atherosclerosis (ICD10-I70.0). 5. Other imaging findings of potential clinical significance: At least mild four-vessel coronary artery calcifications. Cardiomegaly. Multilevel mid to lower thoracic chronic appearing compression fractures with greater than 80% height loss of the T9 vertebral body.  These  results were called by telephone at the time of interpretation on 09/25/2020 at 3:41 pm to provider [facility] , who verbally acknowledged these results.    Electronically Signed   By: Anner Crete M.D.   On: 09/25/2020 15:36  ASSESSMENT AND PLAN    -Multidisciplinary rounds held today  Acute Hypoxic Respiratory Failure -due to left lung pneumonia -there is possible broken and displaced ribs on right 4/5 , as well as consolidated dense LLL infiltrate- will characterize with CT chest -continue Bronchodilator Therapy -Wean Fio2 and PEEP as tolerated -will perform SAT/SBT when respiratory parameters are met -On Zosyn will complete 7-day course today day 5 -tracheal aspirate -+ polymicrobial growth  -Have ordered for pulmonary hygiene to be performed every 4 hours -CT scan performed and notable for LLL PNA. No abscess or complication -Will start oral sedation to help wean and help with agitation. In hopes to achieve better weaning of sedation and vent  -Wife aware that if patient continues to have difficulty weaning he may end up needing a tracheostomy in the future.  However it is still quite early thus will further explore these directions in the week to come.  Septic shock   - due to left lung consolidated pneumonia  -12/25- shock physiology resolved -Zosyn for 7 days -Patient continues to be febrile thus have sent for Dopplers of the lower extremities to look for thrombus   Renal Failure-acute stage 2 -most likely due to ATN -follow chem 7 -follow UO -continue Foley Catheter-assess need daily   Aortic ectasia -Will need to be followed up outpatient with contrasted study per radiology  ID -continue IV abx as prescibed -follow up cultures  GI/Nutrition GI PROPHYLAXIS as indicated DIET-->TF's as tolerated Constipation protocol as indicated  ENDO - ICU hypoglycemic\Hyperglycemia protocol -check FSBS per protocol   ELECTROLYTES -follow labs as  needed -replace as needed -pharmacy consultation   DVT/GI PRX ordered -SCDs  TRANSFUSIONS AS NEEDED MONITOR FSBS ASSESS the need for LABS as needed   Critical care provider statement:    Critical care time (minutes):  35   Critical care time was exclusive of:  Separately billable procedures and treating other patients   Critical care was necessary to treat or prevent imminent or life-threatening deterioration of the following conditions:  septic shock, acute hypoxemic respiratory failure, left lung consolidated pneumonia   Critical care was time spent personally by me on the following activities:  Development of treatment plan with patient or surrogate, discussions with consultants, evaluation of patient's response to treatment, examination of patient, obtaining history from patient or surrogate, ordering and performing treatments and interventions, ordering and review of laboratory studies and re-evaluation of patient's condition.  I assumed direction of critical care for this patient from another provider in my specialty: no    This document was prepared using Dragon voice recognition software and may include unintentional dictation errors.  Newell Coral DO Internal Medicine/Pediatrics Pulmonary and Critical Care Fellow PGY-7

## 2020-09-27 NOTE — Progress Notes (Signed)
Shift summary-  Patient febrile throughout shift provider cooling blanket applied and labs ordered.

## 2020-09-28 ENCOUNTER — Inpatient Hospital Stay: Payer: PPO

## 2020-09-28 DIAGNOSIS — R6521 Severe sepsis with septic shock: Secondary | ICD-10-CM

## 2020-09-28 DIAGNOSIS — R4182 Altered mental status, unspecified: Secondary | ICD-10-CM | POA: Diagnosis not present

## 2020-09-28 DIAGNOSIS — A419 Sepsis, unspecified organism: Secondary | ICD-10-CM | POA: Diagnosis not present

## 2020-09-28 LAB — BLOOD CULTURE ID PANEL (REFLEXED) - BCID2

## 2020-09-28 LAB — CBC WITH DIFFERENTIAL/PLATELET
Abs Immature Granulocytes: 0.05 10*3/uL (ref 0.00–0.07)
Basophils Absolute: 0 10*3/uL (ref 0.0–0.1)
Basophils Relative: 0 %
Eosinophils Absolute: 0 10*3/uL (ref 0.0–0.5)
Eosinophils Relative: 1 %
HCT: 48.4 % (ref 39.0–52.0)
Hemoglobin: 15 g/dL (ref 13.0–17.0)
Immature Granulocytes: 1 %
Lymphocytes Relative: 13 %
Lymphs Abs: 1.1 10*3/uL (ref 0.7–4.0)
MCH: 30.2 pg (ref 26.0–34.0)
MCHC: 31 g/dL (ref 30.0–36.0)
MCV: 97.4 fL (ref 80.0–100.0)
Monocytes Absolute: 1.3 10*3/uL — ABNORMAL HIGH (ref 0.1–1.0)
Monocytes Relative: 15 %
Neutro Abs: 6 10*3/uL (ref 1.7–7.7)
Neutrophils Relative %: 70 %
Platelets: 172 10*3/uL (ref 150–400)
RBC: 4.97 MIL/uL (ref 4.22–5.81)
RDW: 13 % (ref 11.5–15.5)
WBC: 8.6 10*3/uL (ref 4.0–10.5)
nRBC: 0 % (ref 0.0–0.2)

## 2020-09-28 LAB — MAGNESIUM: Magnesium: 2.2 mg/dL (ref 1.7–2.4)

## 2020-09-28 LAB — RENAL FUNCTION PANEL
Albumin: 2.5 g/dL — ABNORMAL LOW (ref 3.5–5.0)
Anion gap: 8 (ref 5–15)
BUN: 35 mg/dL — ABNORMAL HIGH (ref 6–20)
CO2: 37 mmol/L — ABNORMAL HIGH (ref 22–32)
Calcium: 8.8 mg/dL — ABNORMAL LOW (ref 8.9–10.3)
Chloride: 95 mmol/L — ABNORMAL LOW (ref 98–111)
Creatinine, Ser: 0.73 mg/dL (ref 0.61–1.24)
GFR, Estimated: >60 mL/min (ref >60)
Glucose, Bld: 115 mg/dL — ABNORMAL HIGH (ref 70–99)
Phosphorus: 3.6 mg/dL (ref 2.5–4.6)
Potassium: 4 mmol/L (ref 3.5–5.1)
Sodium: 140 mmol/L (ref 135–145)

## 2020-09-28 LAB — GLUCOSE, CAPILLARY
Glucose-Capillary: 119 mg/dL — ABNORMAL HIGH (ref 70–99)
Glucose-Capillary: 101 mg/dL — ABNORMAL HIGH (ref 70–99)
Glucose-Capillary: 102 mg/dL — ABNORMAL HIGH (ref 70–99)
Glucose-Capillary: 134 mg/dL — ABNORMAL HIGH (ref 70–99)
Glucose-Capillary: 97 mg/dL (ref 70–99)
Glucose-Capillary: 129 mg/dL — ABNORMAL HIGH (ref 70–99)

## 2020-09-28 LAB — TRIGLYCERIDES: Triglycerides: 170 mg/dL — ABNORMAL HIGH (ref <150)

## 2020-09-28 NOTE — Progress Notes (Addendum)
On sedation wean, left gaze preference, not moving LLE even to pain.  Tachypneic, unable to lift head off bed, can nod head occasionally appropriately to command.  This could be related to old R MCA stroke but deficits are pretty profound. Re-sedate, repeat head CT, consider EEG.  Myrla Halsted MD PCCM  Update: CT looks okay, check EEG.  Discussed with girlfriend at bedside.  He does not drink alcohol, no illicit substances except marijuana.  May just need to do a slower sedation wean tomorrow and try SAT again.

## 2020-09-28 NOTE — Progress Notes (Signed)
Neuro: Stable on sedation, trying to wean propofol, PICC line placement required it to be turned back on, will pass on to night shift to wean as tolerated Resp: tolerating vent settings well CV: TMAX 38, vital signs otherwise stable GIGU: foley/OG in place, tolerating well, no BM but passing significant gas Skin: blisters on bilateral forearms, gauze dressings in place Social: significant other at bedside for most of the day, the patient responds very well to her. They were interactive while sedation was down.  Events: PICC line placement, vent setting changes

## 2020-09-28 NOTE — Procedures (Signed)
Patient Name: Eddie Chapman  MRN: [id number]  Epilepsy Attending: Charlsie Quest  Referring Physician/Provider: Dr Levon Hedger Date: 09/28/2020 Duration:   Patient history: 59yo M with chronic R MCA infarct with left gaze preference, not moving LLE. EEG to evaluate for seizure  Level of alertness:  comatose  AEDs during EEG study: Propofol  Technical aspects: This EEG study was done with scalp electrodes positioned according to the 10-20 International system of electrode placement. Electrical activity was acquired at a sampling rate of 500Hz  and reviewed with a high frequency filter of 70Hz  and a low frequency filter of 1Hz . EEG data were recorded continuously and digitally stored.   Description: EEG showed continuous generalized 3 to 6 Hz theta-delta slowing with overriding 15 to 18 Hz beta activity with distributed symmetrically and diffusely.  Hyperventilation and photic stimulation were not performed.     ABNORMALITY -Excessive beta, generalized -Continuous slow, generalized  IMPRESSION: This study is suggestive of severe diffuse encephalopathy, nonspecific etiology but likely related to sedation. No seizures or epileptiform discharges were seen throughout the recording.  Riann Oman 

## 2020-09-28 NOTE — Progress Notes (Signed)
eeg done °

## 2020-09-28 NOTE — Progress Notes (Signed)
CRITICAL CARE NOTE    Name: Eddie Chapman MRN: [id number] DOB: 1960-10-12     LOS: 7   SUBJECTIVE FINDINGS & SIGNIFICANT EVENTS    Patient description:  59 yo M with hx of cerebral aneurysm, PVD, HFrEF, pulmonary htn, morbid obesity, OSA, GERD, hx of TBI, dyslipidemia who came in after girlfriend found him to be unresponsive.  He was found to be severely hypoxemic in circulatory shock with left lung infiltrate on CXR. Labwork consistent with sepsis.   Events:  09/22/20- patient is off vasopressors.  He is weaning on FIO2.  Family at bedside (girlfriend).  Patient is weaned to 40% FIO2. 09/23/20- patient failed SBT today. 09/25/20-patient with continued tenacious secretions and patient also with altered mentation.  Started Precedex in hopes to be able to wean Versed.  As patient extremely altered and difficult to arouse.  CT head and CT chest obtained 09/26/20-Pateint following commands intermittently but agitated.  SBT attempted this morning and failed. Patient was taking TV of 200 on 10/5. RR increased to 40. Placed back on rate and sedaiton increased.  09/27/2020-patient attempted another spontaneous breathing trial.  Patient becomes extremely agitated.  Patient takes shallow breaths and has high respiratory rate. 12/30- remains heavily sedated  Lines/tubes :  PIVx2  PAST MEDICAL HISTORY   Past Medical History:  Diagnosis Date  . Adenomatous colon polyp   . Depression   . Ear drum perforation    left x2   . GERD (gastroesophageal reflux disease)   . Hyperlipidemia   . Hypertension   . T8 vertebral fracture [facility])      SURGICAL HISTORY   Past Surgical History:  Procedure Laterality Date  . CARPAL TUNNEL RELEASE     left hand  . COLONOSCOPY  09/18/2009, 09/15/2014  . COLONOSCOPY WITH PROPOFOL  N/A 12/29/2017   Procedure: COLONOSCOPY WITH PROPOFOL;  Surgeon: Manya Silvas, MD;  Location: [facility] ENDOSCOPY;  Service: Endoscopy;  Laterality: N/A;  . EYE SURGERY    . FRACTURE SURGERY    . HERNIA REPAIR    . LIPOMA EXCISION    . SPINE SURGERY       FAMILY HISTORY   Family History  Problem Relation Age of Onset  . Cancer Mother   . Varicose Veins Mother      SOCIAL HISTORY   Social History   Tobacco Use  . Smoking status: Never Smoker  . Smokeless tobacco: Never Used  Vaping Use  . Vaping Use: Never used  Substance Use Topics  . Alcohol use: Never  . Drug use: Never     MEDICATIONS   Current Medication:  Current Facility-Administered Medications:  .  0.9 %  sodium chloride infusion, 250 mL, Intravenous, Continuous, Bradly Bienenstock, NP, Stopped at 09/25/20 0532 .  acetaminophen (TYLENOL) tablet 650 mg, 650 mg, Oral, Q6H PRN, Tyna Jaksch, MD, 650 mg at 09/26/20 1818 .  chlorhexidine gluconate (MEDLINE KIT) (PERIDEX) 0.12 % solution 15 mL, 15 mL, Mouth Rinse, BID, [name], Fuad, MD, 15 mL at 09/28/20 0932 .  Chlorhexidine Gluconate Cloth 2 % PADS 6 each, 6 each, Topical, Daily, Bradly Bienenstock, NP, 6 each at 09/27/20 2133 .  dexmedetomidine (PRECEDEX) 400 MCG/100ML (4 mcg/mL) infusion, 0.4-1.2 mcg/kg/hr, Intravenous, Titrated, Tyna Jaksch, MD, Last Rate: 14.85 mL/hr at 09/28/20 0807, 0.4 mcg/kg/hr at 09/28/20 0807 .  docusate (COLACE) 50 MG/5ML liquid 200 mg, 200 mg, Per Tube, BID, [name], RPH, 200 mg at 09/28/20 8416 .  docusate sodium (  COLACE) capsule 100 mg, 100 mg, Oral, BID PRN, [name], Fuad, MD .  enoxaparin (LOVENOX) injection 70 mg, 0.5 mg/kg, Subcutaneous, Q24H, Tyna Jaksch, MD, 70 mg at 09/27/20 2133 .  feeding supplement (PROSource TF) liquid 90 mL, 90 mL, Per Tube, TID, Tyna Jaksch, MD, 90 mL at 09/28/20 0918 .  feeding supplement (VITAL HIGH PROTEIN) liquid 1,000 mL, 1,000 mL, Per Tube, Continuous,  Tyna Jaksch, MD, Last Rate: 60 mL/hr at 09/27/20 0913, 1,000 mL at 09/27/20 0913 .  fentaNYL (SUBLIMAZE) injection 100 mcg, 100 mcg, Intravenous, Q1H PRN, Tyna Jaksch, MD, 100 mcg at 09/25/20 2321 .  fentaNYL 2514mg in NS 2531m(1054mml) infusion-PREMIX, 0-400 mcg/hr, Intravenous, Continuous, Siadecki, SebFelix AhmadiD, Last Rate: 15 mL/hr at 09/27/20 2044, 150 mcg/hr at 09/27/20 2044 .  furosemide (LASIX) injection 40 mg, 40 mg, Intravenous, Daily, AleOttie GlazierD, 40 mg at 09/28/20 0933 .  ipratropium-albuterol (DUONEB) 0.5-2.5 (3) MG/3ML nebulizer solution 3 mL, 3 mL, Nebulization, Q6H, AleLanney Ginsuad, MD, 3 mL at 09/28/20 0725 .  labetalol (NORMODYNE) injection 10 mg, 10 mg, Intravenous, Q2H PRN, IzqTyna JakschD .  LORazepam (ATIVAN) injection 2 mg, 2 mg, Intravenous, Q3H PRN, AleOttie GlazierD, 2 mg at 09/27/20 0619 .  MEDLINE mouth rinse, 15 mL, Mouth Rinse, 10 times per day, AleOttie GlazierD, 15 mL at 09/28/20 0538 .  pantoprazole sodium (PROTONIX) 40 mg/20 mL oral suspension 40 mg, 40 mg, Per Tube, Daily, GruDallie PilesPH, 40 mg at 09/28/20 [id number] piperacillin-tazobactam (ZOSYN) IVPB 3.375 g, 3.375 g, Intravenous, Q8H, CunBenn MoulderPH, Last Rate: 12.5 mL/hr at 09/28/20 0621, 3.375 g at 09/28/20 [id number] polyethylene glycol (MIRALAX / GLYCOLAX) packet 17 g, 17 g, Oral, Daily PRN, AleLanney Ginsuad, MD .  polyethylene glycol (MIRALAX / GLYCOLAX) packet 17 g, 17 g, Per Tube, Daily, GruDallie PilesPH, 17 g at 09/28/20 [id number] propofol (DIPRIVAN) 1000 MG/100ML infusion, 5-80 mcg/kg/min, Intravenous, Titrated, KeeDarel Hong NP, Last Rate: 26.7 mL/hr at 09/28/20 0805, 30 mcg/kg/min at 09/28/20 0805 .  QUEtiapine (SEROQUEL) tablet 50 mg, 50 mg, Oral, BID, IzqTyna JakschD, 50 mg at 09/28/20 0920 .  sucralfate (CARAFATE) 1 GM/10ML suspension 1 g, 1 g, Per Tube, TID WC & HS, AleLanney Ginsuad, MD, 1 g at 09/28/20 [id number] ALLERGIES    Divalproex sodium and Valproic acid    PHYSICAL EXAMINATION   Vital Signs: Temp:  [97.7 F (36.5 C)-100 F (37.8 C)] 97.7 F (36.5 C) (12/30 0400) Pulse Rate:  [45-91] 54 (12/30 0700) Resp:  [15-42] 15 (12/30 0700) BP: (84-147)/(57-121) 92/69 (12/30 0700) SpO2:  [91 %-98 %] 97 % (12/30 0700) FiO2 (%):  [28 %-30 %] 28 % (12/30 0727)  Constitutional: chronically ill appearing man in NAD  Eyes: Pupils equal Ears, nose, mouth, and throat: ETT in place, some thick secretion Cardiovascular: RRR, ext warm Respiratory: diminished bases, no accessory muscle use Gastrointestinal: Soft, hypoactive BS Skin: No rashes, normal turgor, no edema; peripheral vascular disease changes Neurologic: winces to pain, + cough Psychiatric: RASS -4  LE DVT neg  ASSESSMENT AND PLAN   -Acute hypoxemic respiratory failure with question of LLL infiltrate/PNA- CT not very impressive, fever curve downtrending -Acute metabolic encephalopathy related to critical illness, sedation -Class 3 obesity -GERD -Depression   -Work on sedation wean and SBT today -7 days zosyn -Reassess mental status after extubation   Patient critically ill due to respiratory failure Interventions to  address this today sedation and ventilator weaning Risk of deterioration without these interventions is high  I personally spent 35 minutes providing critical care not including any separately billable procedures  Erskine Emery MD [facility] 09/28/2020 10:50 AM Personal pager: [phone] If unanswered, please page CCM On-call: [phone]

## 2020-09-29 DIAGNOSIS — G9340 Encephalopathy, unspecified: Secondary | ICD-10-CM

## 2020-09-29 DIAGNOSIS — J9601 Acute respiratory failure with hypoxia: Secondary | ICD-10-CM | POA: Diagnosis not present

## 2020-09-29 LAB — CBC WITH DIFFERENTIAL/PLATELET
Abs Immature Granulocytes: 0.03 10*3/uL (ref 0.00–0.07)
Basophils Absolute: 0 10*3/uL (ref 0.0–0.1)
Basophils Relative: 0 %
Eosinophils Absolute: 0.1 10*3/uL (ref 0.0–0.5)
Eosinophils Relative: 1 %
HCT: 48.4 % (ref 39.0–52.0)
Hemoglobin: 15.4 g/dL (ref 13.0–17.0)
Immature Granulocytes: 0 %
Lymphocytes Relative: 14 %
Lymphs Abs: 1 10*3/uL (ref 0.7–4.0)
MCH: 30.6 pg (ref 26.0–34.0)
MCHC: 31.8 g/dL (ref 30.0–36.0)
MCV: 96.2 fL (ref 80.0–100.0)
Monocytes Absolute: 1 10*3/uL (ref 0.1–1.0)
Monocytes Relative: 15 %
Neutro Abs: 4.7 10*3/uL (ref 1.7–7.7)
Neutrophils Relative %: 70 %
Platelets: 165 10*3/uL (ref 150–400)
RBC: 5.03 MIL/uL (ref 4.22–5.81)
RDW: 12.9 % (ref 11.5–15.5)
WBC: 6.8 10*3/uL (ref 4.0–10.5)
nRBC: 0 % (ref 0.0–0.2)

## 2020-09-29 LAB — GLUCOSE, CAPILLARY
Glucose-Capillary: 111 mg/dL — ABNORMAL HIGH (ref 70–99)
Glucose-Capillary: 118 mg/dL — ABNORMAL HIGH (ref 70–99)
Glucose-Capillary: 164 mg/dL — ABNORMAL HIGH (ref 70–99)
Glucose-Capillary: 114 mg/dL — ABNORMAL HIGH (ref 70–99)
Glucose-Capillary: 123 mg/dL — ABNORMAL HIGH (ref 70–99)
Glucose-Capillary: 150 mg/dL — ABNORMAL HIGH (ref 70–99)

## 2020-09-29 LAB — RENAL FUNCTION PANEL
Albumin: 2.6 g/dL — ABNORMAL LOW (ref 3.5–5.0)
Anion gap: 9 (ref 5–15)
BUN: 36 mg/dL — ABNORMAL HIGH (ref 6–20)
CO2: 37 mmol/L — ABNORMAL HIGH (ref 22–32)
Calcium: 8.7 mg/dL — ABNORMAL LOW (ref 8.9–10.3)
Chloride: 96 mmol/L — ABNORMAL LOW (ref 98–111)
Creatinine, Ser: 0.79 mg/dL (ref 0.61–1.24)
GFR, Estimated: >60 mL/min (ref >60)
Glucose, Bld: 123 mg/dL — ABNORMAL HIGH (ref 70–99)
Phosphorus: 3.8 mg/dL (ref 2.5–4.6)
Potassium: 4.1 mmol/L (ref 3.5–5.1)
Sodium: 142 mmol/L (ref 135–145)

## 2020-09-29 LAB — MAGNESIUM: Magnesium: 2 mg/dL (ref 1.7–2.4)

## 2020-09-29 MED ORDER — SODIUM CHLORIDE 0.9% FLUSH
10.0000 mL | Freq: Two times a day (BID) | INTRAVENOUS | Status: DC
  Administered 2020-09-29 – 2020-09-30 (×3): 10 mL
  Administered 2020-10-01: 20 mL
  Administered 2020-10-01 – 2020-10-02 (×3): 10 mL
  Administered 2020-10-03: 20 mL
  Administered 2020-10-03 – 2020-10-04 (×2): 10 mL
  Administered 2020-10-04: 30 mL
  Administered 2020-10-05: 10 mL
  Administered 2020-10-05: 20 mL
  Administered 2020-10-06 – 2020-10-13 (×8): 10 mL

## 2020-09-29 MED ORDER — SODIUM CHLORIDE 0.9% FLUSH: 10.0000 mL | INTRAVENOUS | Status: DC | PRN

## 2020-09-29 NOTE — Progress Notes (Signed)
CRITICAL CARE NOTE    Name: Eddie Chapman MRN: [id number] DOB: 02/25/1961     LOS: 89   SUBJECTIVE FINDINGS & SIGNIFICANT EVENTS    Patient description:  59 yo M with hx of cerebral aneurysm, PVD, HFrEF, pulmonary htn, morbid obesity, OSA, GERD, hx of TBI, dyslipidemia who came in after girlfriend found him to be unresponsive.  He was found to be severely hypoxemic in circulatory shock with left lung infiltrate on CXR. Labwork consistent with sepsis.   Events:  09/22/20- patient is off vasopressors.  He is weaning on FIO2.  Family at bedside (girlfriend).  Patient is weaned to 40% FIO2. 09/23/20- patient failed SBT today. 09/25/20-patient with continued tenacious secretions and patient also with altered mentation.  Started Precedex in hopes to be able to wean Versed.  As patient extremely altered and difficult to arouse.  CT head and CT chest obtained 09/26/20-Pateint following commands intermittently but agitated.  SBT attempted this morning and failed. Patient was taking TV of 200 on 10/5. RR increased to 40. Placed back on rate and sedaiton increased.  09/27/2020-patient attempted another spontaneous breathing trial.  Patient becomes extremely agitated.  Patient takes shallow breaths and has high respiratory rate. 12/30- remains heavily sedated 09/29/2020: Poorly responsive when sedation is lightened, does not tolerate SBT  Lines/tubes :  PIVx2  PAST MEDICAL HISTORY   Past Medical History:  Diagnosis Date  . Adenomatous colon polyp   . Depression   . Ear drum perforation    left x2   . GERD (gastroesophageal reflux disease)   . Hyperlipidemia   . Hypertension   . T8 vertebral fracture [facility])      SURGICAL HISTORY   Past Surgical History:  Procedure Laterality Date  . CARPAL TUNNEL RELEASE      left hand  . COLONOSCOPY  09/18/2009, 09/15/2014  . COLONOSCOPY WITH PROPOFOL N/A 12/29/2017   Procedure: COLONOSCOPY WITH PROPOFOL;  Surgeon: Manya Silvas, MD;  Location: [facility] ENDOSCOPY;  Service: Endoscopy;  Laterality: N/A;  . EYE SURGERY    . FRACTURE SURGERY    . HERNIA REPAIR    . LIPOMA EXCISION    . SPINE SURGERY       FAMILY HISTORY   Family History  Problem Relation Age of Onset  . Cancer Mother   . Varicose Veins Mother      SOCIAL HISTORY   Social History   Tobacco Use  . Smoking status: Never Smoker  . Smokeless tobacco: Never Used  Vaping Use  . Vaping Use: Never used  Substance Use Topics  . Alcohol use: Never  . Drug use: Never     MEDICATIONS   Scheduled Meds: . chlorhexidine gluconate (MEDLINE KIT)  15 mL Mouth Rinse BID  . Chlorhexidine Gluconate Cloth  6 each Topical Daily  . docusate  200 mg Per Tube BID  . enoxaparin (LOVENOX) injection  0.5 mg/kg Subcutaneous Q24H  . feeding supplement (PROSource TF)  90 mL Per Tube TID  . furosemide  40 mg Intravenous Daily  . ipratropium-albuterol  3 mL Nebulization Q6H  . mouth rinse  15 mL Mouth Rinse 10 times per day  . pantoprazole sodium  40 mg Per Tube Daily  . polyethylene glycol  17 g Per Tube Daily  . QUEtiapine  50 mg Oral BID  . sodium chloride flush  10-40 mL Intracatheter Q12H  . sucralfate  1 g Per Tube TID WC & HS   Continuous Infusions: . sodium chloride 10  mL/hr at 09/30/20 0341  . dexmedetomidine (PRECEDEX) IV infusion 1 mcg/kg/hr (09/30/20 1749)  . feeding supplement (VITAL HIGH PROTEIN) 1,000 mL (09/30/20 0530)  . fentaNYL infusion INTRAVENOUS 300 mcg/hr (09/30/20 1750)  . propofol (DIPRIVAN) infusion 20 mcg/kg/min (09/30/20 1748)   PRN Meds:.acetaminophen, docusate sodium, fentaNYL (SUBLIMAZE) injection, labetalol, LORazepam, polyethylene glycol, sodium chloride flush   ALLERGIES   Divalproex sodium and Valproic acid    PHYSICAL EXAMINATION   Vital Signs: Temp:   [98.96 F (37.2 C)-100.4 F (38 C)] 99.5 F (37.5 C) (12/31 2000) Pulse Rate:  [57-104] 70 (12/31 2000) Resp:  [18-32] 20 (12/31 2000) BP: (89-154)/(62-114) 96/63 (12/31 2000) SpO2:  [91 %-98 %] 97 % (12/31 2000) FiO2 (%):  [28 %-45 %] 45 % (12/31 2000) Weight:  [177 kg] 139 kg (12/31 0309)   Vent Mode: PSV FiO2 (%):  [28 %-45 %] 35 % Set Rate:  [15 bmp] 15 bmp Vt Set:  [500 mL] 500 mL PEEP:  [5 cmH20] 5 cmH20 Pressure Support:  [8 cmH20] 8 cmH20 Plateau Pressure:  [16 cmH20] 16 cmH20  Physical exam: Constitutional: chronically ill appearing man intubated mechanically ventilated, synchronous with the ventilator Eyes: Pupils equal Ears, nose, mouth, and throat: ETT in place, some thick secretions Cardiovascular: RRR, ext warm Respiratory: diminished bases, no accessory muscle use Gastrointestinal: Soft, hypoactive BS Skin: No rashes, normal turgor, no edema; peripheral vascular disease changes Neurologic: Does not track when sedation is lightened not following commands  ASSESSMENT AND PLAN   -Acute hypoxemic respiratory failure with question of LLL infiltrate/PNA- CT not very impressive, fever curve downtrending -Acute metabolic encephalopathy related to critical illness, sedation -Class 3 obesity -GERD -Depression   -Work on sedation wean and SBT today -7 days zosyn completed Continues to fail SBT  Patient critically ill due to respiratory failure Interventions to address this today sedation and ventilator weaning Risk of deterioration without these interventions is high  We will place palliative care consultation, it appears patient has had chronic issues to include significant encephalomalacia.  I personally spent 35 minutes providing critical care not including any separately billable procedures  C. Derrill Kay, MD [facility]   *This note was dictated using voice recognition software/Dragon.  Despite best efforts to proofread, errors can occur which can  change the meaning.  Any change was purely unintentional.

## 2020-09-29 NOTE — Progress Notes (Signed)
Peripherally Inserted Central Catheter Placement  The IV Nurse has discussed with the patient and/or persons authorized to consent for the patient, the purpose of this procedure and the potential benefits and risks involved with this procedure.  The benefits include less needle sticks, lab draws from the catheter, and the patient may be discharged home with the catheter. Risks include, but not limited to, infection, bleeding, blood clot (thrombus formation), and puncture of an artery; nerve damage and irregular heartbeat and possibility to perform a PICC exchange if needed/ordered by physician.  Alternatives to this procedure were also discussed.  Bard Power PICC patient education guide, fact sheet on infection prevention and patient information card has been provided to patient /or left at bedside.  Consent obtained from significant other as father is elderly and patient is estranged from his siblings.  PICC Placement Documentation  PICC Double Lumen 09/29/20 PICC Right Brachial 46 cm 0 cm (Active)  Indication for Insertion or Continuance of Line Prolonged intravenous therapies 09/29/20 1741  Exposed Catheter (cm) 0 cm 09/29/20 1741  Site Assessment Clean;Dry;Intact 09/29/20 1741  Lumen #1 Status Flushed;Saline locked;Blood return noted 09/29/20 1741  Lumen #2 Status Flushed;Saline locked;Blood return noted 09/29/20 1741  Dressing Type Transparent 09/29/20 1741  Dressing Status Clean;Dry;Intact 09/29/20 1741  Safety Lock Not Applicable 09/29/20 1741  Line Care Connections checked and tightened 09/29/20 1741  Line Adjustment (NICU/IV Team Only) No 09/29/20 1741  Dressing Intervention New dressing 09/29/20 1741  Dressing Change Due 10/06/20 09/29/20 1741       Evaleigh Mccamy, Lajean Manes 09/29/2020, 5:42 PM

## 2020-09-29 NOTE — Progress Notes (Signed)
CH visited pt. while rounding on ICU; pt. lying in bed, intubated, eyes open; girlfriend Nelva Bush at bedside talking to pt., holding his hand.  Nelva Bush shared she and many other friends have been praying for pt. --> he has severe pneumonia.  Pt.'s 60yo father has in in contact w/[name].  [name] requested prayer for pt.'s recovery and specifically for new IV placement later today. CH remains available as needed.

## 2020-09-30 ENCOUNTER — Inpatient Hospital Stay: Payer: PPO

## 2020-09-30 ENCOUNTER — Inpatient Hospital Stay: Payer: Self-pay

## 2020-09-30 DIAGNOSIS — J9601 Acute respiratory failure with hypoxia: Secondary | ICD-10-CM | POA: Diagnosis not present

## 2020-09-30 DIAGNOSIS — G9340 Encephalopathy, unspecified: Secondary | ICD-10-CM | POA: Diagnosis not present

## 2020-09-30 LAB — RENAL FUNCTION PANEL
Albumin: 2.6 g/dL — ABNORMAL LOW (ref 3.5–5.0)
Anion gap: 7 (ref 5–15)
BUN: 36 mg/dL — ABNORMAL HIGH (ref 6–20)
CO2: 37 mmol/L — ABNORMAL HIGH (ref 22–32)
Calcium: 9 mg/dL (ref 8.9–10.3)
Chloride: 96 mmol/L — ABNORMAL LOW (ref 98–111)
Creatinine, Ser: 0.79 mg/dL (ref 0.61–1.24)
GFR, Estimated: >60 mL/min (ref >60)
Glucose, Bld: 126 mg/dL — ABNORMAL HIGH (ref 70–99)
Phosphorus: 4.2 mg/dL (ref 2.5–4.6)
Potassium: 4.2 mmol/L (ref 3.5–5.1)
Sodium: 140 mmol/L (ref 135–145)

## 2020-09-30 LAB — CBC WITH DIFFERENTIAL/PLATELET
Abs Immature Granulocytes: 0.03 10*3/uL (ref 0.00–0.07)
Basophils Absolute: 0 10*3/uL (ref 0.0–0.1)
Basophils Relative: 0 %
Eosinophils Absolute: 0.1 10*3/uL (ref 0.0–0.5)
Eosinophils Relative: 1 %
HCT: 49.1 % (ref 39.0–52.0)
Hemoglobin: 15.1 g/dL (ref 13.0–17.0)
Immature Granulocytes: 1 %
Lymphocytes Relative: 14 %
Lymphs Abs: 0.9 10*3/uL (ref 0.7–4.0)
MCH: 29.7 pg (ref 26.0–34.0)
MCHC: 30.8 g/dL (ref 30.0–36.0)
MCV: 96.7 fL (ref 80.0–100.0)
Monocytes Absolute: 1 10*3/uL (ref 0.1–1.0)
Monocytes Relative: 17 %
Neutro Abs: 4.1 10*3/uL (ref 1.7–7.7)
Neutrophils Relative %: 67 %
Platelets: 156 10*3/uL (ref 150–400)
RBC: 5.08 MIL/uL (ref 4.22–5.81)
RDW: 12.8 % (ref 11.5–15.5)
WBC: 6.1 10*3/uL (ref 4.0–10.5)
nRBC: 0 % (ref 0.0–0.2)

## 2020-09-30 LAB — GLUCOSE, CAPILLARY
Glucose-Capillary: 117 mg/dL — ABNORMAL HIGH (ref 70–99)
Glucose-Capillary: 119 mg/dL — ABNORMAL HIGH (ref 70–99)
Glucose-Capillary: 122 mg/dL — ABNORMAL HIGH (ref 70–99)
Glucose-Capillary: 129 mg/dL — ABNORMAL HIGH (ref 70–99)
Glucose-Capillary: 143 mg/dL — ABNORMAL HIGH (ref 70–99)
Glucose-Capillary: 149 mg/dL — ABNORMAL HIGH (ref 70–99)

## 2020-09-30 LAB — CULTURE, BLOOD (ROUTINE X 2)

## 2020-09-30 LAB — MAGNESIUM: Magnesium: 1.8 mg/dL (ref 1.7–2.4)

## 2020-09-30 MED ORDER — MAGNESIUM SULFATE 2 GM/50ML IV SOLN
2.0000 g | Freq: Once | INTRAVENOUS | Status: AC
  Administered 2020-09-30: 2 g via INTRAVENOUS
  Filled 2020-09-30: qty 50

## 2020-09-30 NOTE — Progress Notes (Signed)
Propofol decreased to and fentanyl decreased to for wake up assessment and possible spontaneous breathing trial.

## 2020-09-30 NOTE — Progress Notes (Signed)
Patient with noted increased work of breathing, respiratory rate increased to 28-30, and increased heart rate of 115.  O2 SATs remained 95-98%.  Patient placed back on full vent support.  End tidal reading 73-85 and resistance met with endotracheal suctioning.  Dr. Patsey Berthold notified and order obtained for cxr.

## 2020-09-30 NOTE — Progress Notes (Signed)
Pt failed PSV 8/5 wean due to increase work of breathing/RR/HR. Pt placed back on resting settings. RN & MD aware

## 2020-09-30 NOTE — Progress Notes (Signed)
CRITICAL CARE NOTE    Name: LABRADFORD SCHNITKER MRN: [id number] DOB: [birth year]     LOS: 6   SUBJECTIVE FINDINGS & SIGNIFICANT EVENTS    Patient description:  60 yo M with hx of cerebral aneurysm, PVD, HFrEF, pulmonary htn, morbid obesity, OSA, GERD, hx of TBI, dyslipidemia who came in after girlfriend found him to be unresponsive.  He was found to be severely hypoxemic in circulatory shock with left lung infiltrate on CXR. Labwork consistent with sepsis.   Events:  09/22/20- patient is off vasopressors.  He is weaning on FIO2.  Family at bedside (girlfriend).  Patient is weaned to 40% FIO2. 09/23/20- patient failed SBT today. 09/25/20-patient with continued tenacious secretions and patient also with altered mentation.  Started Precedex in hopes to be able to wean Versed.  As patient extremely altered and difficult to arouse.  CT head and CT chest obtained 09/26/20-Pateint following commands intermittently but agitated.  SBT attempted this morning and failed. Patient was taking TV of 200 on 10/5. RR increased to 40. Placed back on rate and sedaiton increased.  09/27/2020-patient attempted another spontaneous breathing trial.  Patient becomes extremely agitated.  Patient takes shallow breaths and has high respiratory rate. 12/30- remains heavily sedated 09/29/2020: Poorly responsive when sedation is lightened, does not tolerate SBT 10/01/2019: Not tolerating SBT, will need goals of care addressed  Lines/tubes :  PIVx2  PAST MEDICAL HISTORY   Past Medical History:  Diagnosis Date  . Adenomatous colon polyp   . Depression   . Ear drum perforation    left x2   . GERD (gastroesophageal reflux disease)   . Hyperlipidemia   . Hypertension   . T8 vertebral fracture [facility])      SURGICAL HISTORY   Past Surgical  History:  Procedure Laterality Date  . CARPAL TUNNEL RELEASE     left hand  . COLONOSCOPY  09/18/2009, 09/15/2014  . COLONOSCOPY WITH PROPOFOL N/A 12/29/2017   Procedure: COLONOSCOPY WITH PROPOFOL;  Surgeon: Manya Silvas, MD;  Location: [facility] ENDOSCOPY;  Service: Endoscopy;  Laterality: N/A;  . EYE SURGERY    . FRACTURE SURGERY    . HERNIA REPAIR    . LIPOMA EXCISION    . SPINE SURGERY       FAMILY HISTORY   Family History  Problem Relation Age of Onset  . Cancer Mother   . Varicose Veins Mother      SOCIAL HISTORY   Social History   Tobacco Use  . Smoking status: Never Smoker  . Smokeless tobacco: Never Used  Vaping Use  . Vaping Use: Never used  Substance Use Topics  . Alcohol use: Never  . Drug use: Never     MEDICATIONS   Scheduled Meds: . chlorhexidine gluconate (MEDLINE KIT)  15 mL Mouth Rinse BID  . Chlorhexidine Gluconate Cloth  6 each Topical Daily  . docusate  200 mg Per Tube BID  . enoxaparin (LOVENOX) injection  0.5 mg/kg Subcutaneous Q24H  . feeding supplement (PROSource TF)  90 mL Per Tube TID  . furosemide  40 mg Intravenous Daily  . ipratropium-albuterol  3 mL Nebulization Q6H  . mouth rinse  15 mL Mouth Rinse 10 times per day  . pantoprazole sodium  40 mg Per Tube Daily  . polyethylene glycol  17 g Per Tube Daily  . QUEtiapine  50 mg Oral BID  . sodium chloride flush  10-40 mL Intracatheter Q12H  . sucralfate  1 g Per Tube TID WC &  HS   Continuous Infusions: . sodium chloride 10 mL/hr at 09/30/20 0341  . dexmedetomidine (PRECEDEX) IV infusion 1 mcg/kg/hr (09/30/20 1749)  . feeding supplement (VITAL HIGH PROTEIN) 1,000 mL (09/30/20 0530)  . fentaNYL infusion INTRAVENOUS 300 mcg/hr (09/30/20 1750)  . propofol (DIPRIVAN) infusion 20 mcg/kg/min (09/30/20 1748)   PRN Meds:.acetaminophen, docusate sodium, fentaNYL (SUBLIMAZE) injection, labetalol, LORazepam, polyethylene glycol, sodium chloride flush   ALLERGIES   Divalproex sodium and  Valproic acid    PHYSICAL EXAMINATION   Vital Signs: Temp:  [98.96 F (37.2 C)-100.22 F (37.9 C)] 100.22 F (37.9 C) (01/01 1800) Pulse Rate:  [63-105] 69 (01/01 1800) Resp:  [14-29] 15 (01/01 1800) BP: (93-121)/(61-75) 97/68 (01/01 1800) SpO2:  [88 %-98 %] 98 % (01/01 1800) FiO2 (%):  [28 %-45 %] 35 % (01/01 1800) Weight:  [137.6 kg] 137.6 kg (01/01 0338)   Vent Mode: PSV FiO2 (%):  [28 %-45 %] 35 % Set Rate:  [15 bmp] 15 bmp Vt Set:  [500 mL] 500 mL PEEP:  [5 cmH20] 5 cmH20 Pressure Support:  [8 cmH20] 8 cmH20 Plateau Pressure:  [16 cmH20] 16 cmH20  Physical exam: Constitutional: chronically ill appearing man intubated mechanically ventilated, synchronous with the ventilator Eyes: Pupils equal Ears, nose, mouth, and throat: ETT in place, some thick secretions Cardiovascular: RRR, ext warm Respiratory: diminished bases, no accessory muscle use Gastrointestinal: Soft, hypoactive BS Skin: No rashes, normal turgor, no edema; peripheral vascular disease changes Neurologic: Does not track when sedation is lightened not following commands  Chest x-ray today:    ASSESSMENT AND PLAN   Acute hypoxemic respiratory failure with LLL infiltrate/atelectasis CT chest not very impressive, findings more related to the spinal fat Acute metabolic encephalopathy related to critical illness, sedation Class 3 obesity GERD Depression   -Work on sedation wean and SBT as tolerated -7 days zosyn completed -Continues to fail SBT -Palliative care consultation pending -  Patient critically ill due to respiratory failure  Risk of deterioration without these interventions is high  Updated patient's significant other, Bubba Hales, at bedside  I personally spent 35 minutes providing critical care not including any separately billable procedures  C. Derrill Kay, MD [facility]   *This note was dictated using voice recognition software/Dragon.  Despite best efforts to proofread,  errors can occur which can change the meaning.  Any change was purely unintentional.

## 2020-09-30 NOTE — Progress Notes (Signed)
Bambi with respiratory started patient on spontaneous breathing trial.

## 2020-09-30 NOTE — Progress Notes (Signed)
Patient following simple commands.  Patient able to stick tongue out, raise both arms, wiggle feet and toes bilaterally\, and lift head off bed. Sedation turned of for vent wean.

## 2020-10-01 DIAGNOSIS — J9601 Acute respiratory failure with hypoxia: Secondary | ICD-10-CM | POA: Diagnosis not present

## 2020-10-01 LAB — RENAL FUNCTION PANEL
Albumin: 2.6 g/dL — ABNORMAL LOW (ref 3.5–5.0)
Anion gap: 10 (ref 5–15)
BUN: 39 mg/dL — ABNORMAL HIGH (ref 6–20)
CO2: 38 mmol/L — ABNORMAL HIGH (ref 22–32)
Calcium: 8.7 mg/dL — ABNORMAL LOW (ref 8.9–10.3)
Chloride: 95 mmol/L — ABNORMAL LOW (ref 98–111)
Creatinine, Ser: 0.76 mg/dL (ref 0.61–1.24)
GFR, Estimated: >60 mL/min (ref >60)
Glucose, Bld: 131 mg/dL — ABNORMAL HIGH (ref 70–99)
Phosphorus: 3.4 mg/dL (ref 2.5–4.6)
Potassium: 4.7 mmol/L (ref 3.5–5.1)
Sodium: 143 mmol/L (ref 135–145)

## 2020-10-01 LAB — CBC WITH DIFFERENTIAL/PLATELET
Abs Immature Granulocytes: 0.04 10*3/uL (ref 0.00–0.07)
Basophils Absolute: 0 10*3/uL (ref 0.0–0.1)
Basophils Relative: 0 %
Eosinophils Absolute: 0.1 10*3/uL (ref 0.0–0.5)
Eosinophils Relative: 1 %
HCT: 48.6 % (ref 39.0–52.0)
Hemoglobin: 14.7 g/dL (ref 13.0–17.0)
Immature Granulocytes: 1 %
Lymphocytes Relative: 13 %
Lymphs Abs: 1 10*3/uL (ref 0.7–4.0)
MCH: 30.3 pg (ref 26.0–34.0)
MCHC: 30.2 g/dL (ref 30.0–36.0)
MCV: 100.2 fL — ABNORMAL HIGH (ref 80.0–100.0)
Monocytes Absolute: 1.1 10*3/uL — ABNORMAL HIGH (ref 0.1–1.0)
Monocytes Relative: 15 %
Neutro Abs: 5.1 10*3/uL (ref 1.7–7.7)
Neutrophils Relative %: 70 %
Platelets: 153 10*3/uL (ref 150–400)
RBC: 4.85 MIL/uL (ref 4.22–5.81)
RDW: 12.6 % (ref 11.5–15.5)
WBC: 7.3 10*3/uL (ref 4.0–10.5)
nRBC: 0 % (ref 0.0–0.2)

## 2020-10-01 LAB — GLUCOSE, CAPILLARY
Glucose-Capillary: 111 mg/dL — ABNORMAL HIGH (ref 70–99)
Glucose-Capillary: 160 mg/dL — ABNORMAL HIGH (ref 70–99)
Glucose-Capillary: 108 mg/dL — ABNORMAL HIGH (ref 70–99)
Glucose-Capillary: 115 mg/dL — ABNORMAL HIGH (ref 70–99)
Glucose-Capillary: 180 mg/dL — ABNORMAL HIGH (ref 70–99)
Glucose-Capillary: 144 mg/dL — ABNORMAL HIGH (ref 70–99)

## 2020-10-01 LAB — MAGNESIUM: Magnesium: 2 mg/dL (ref 1.7–2.4)

## 2020-10-01 LAB — BRAIN NATRIURETIC PEPTIDE: B Natriuretic Peptide: 79.4 pg/mL (ref 0.0–100.0)

## 2020-10-01 MED ORDER — CLONAZEPAM 1 MG PO TABS
1.0000 mg | ORAL_TABLET | Freq: Two times a day (BID) | ORAL | Status: DC
  Administered 2020-10-01 – 2020-10-03 (×4): 1 mg
  Filled 2020-10-01 (×4): qty 1

## 2020-10-01 MED ORDER — CIPROFLOXACIN HCL 0.3 % OP SOLN
1.0000 [drp] | OPHTHALMIC | Status: AC
  Administered 2020-10-01 – 2020-10-06 (×30): 1 [drp] via OPHTHALMIC
  Filled 2020-10-01: qty 2.5

## 2020-10-01 NOTE — Progress Notes (Signed)
Wake up assessment: pt 's sedation was cut in half. Eyes opened spontaneously  Pt was initially calm, responded to yes/no questions appropriately, followed commands, and denied pain or discomfort. Shortly began to have increased,heart rate, BP and respiratory rate. Became asynchronous and gagging on OG/ETT tubes. Bit down on his tongue. Significant other Eddie Chapman present. Sedation resumed and titrated so that pt was synchronous with vent, and agitation resolved.

## 2020-10-01 NOTE — Progress Notes (Signed)
CRITICAL CARE NOTE    Name: Eddie Chapman MRN: [id number] DOB: 09/15/1961     LOS: 62   SUBJECTIVE FINDINGS & SIGNIFICANT EVENTS    Patient description:  60 yo M with hx of cerebral aneurysm, PVD, HFrEF, pulmonary htn, morbid obesity, OSA, GERD, hx of TBI, dyslipidemia who came in after girlfriend found him to be unresponsive.  He was found to be severely hypoxemic in circulatory shock with left lung infiltrate on CXR. Labwork consistent with sepsis.   Events:  09/22/20- patient is off vasopressors.  He is weaning on FIO2.  Family at bedside (girlfriend).  Patient is weaned to 40% FIO2. 09/23/20- patient failed SBT today. 09/25/20-patient with continued tenacious secretions and patient also with altered mentation.  Started Precedex in hopes to be able to wean Versed.  As patient extremely altered and difficult to arouse.  CT head and CT chest obtained 09/26/20-Pateint following commands intermittently but agitated.  SBT attempted this morning and failed. Patient was taking TV of 200 on 10/5. RR increased to 40. Placed back on rate and sedaiton increased.  09/27/2020-patient attempted another spontaneous breathing trial.  Patient becomes extremely agitated.  Patient takes shallow breaths and has high respiratory rate. 12/30- remains heavily sedated 09/29/2020: Poorly responsive when sedation is lightened, does not tolerate SBT 10/01/2019: Not tolerating SBT, will need goals of care addressed 10/02/2019: More responsive, still unable to tolerate SBT well  Lines/tubes :  PIVx2  PAST MEDICAL HISTORY   Past Medical History:  Diagnosis Date  . Adenomatous colon polyp   . Depression   . Ear drum perforation    left x2   . GERD (gastroesophageal reflux disease)   . Hyperlipidemia   . Hypertension   . T8  vertebral fracture [facility])      SURGICAL HISTORY   Past Surgical History:  Procedure Laterality Date  . CARPAL TUNNEL RELEASE     left hand  . COLONOSCOPY  09/18/2009, 09/15/2014  . COLONOSCOPY WITH PROPOFOL N/A 12/29/2017   Procedure: COLONOSCOPY WITH PROPOFOL;  Surgeon: Manya Silvas, MD;  Location: [facility] ENDOSCOPY;  Service: Endoscopy;  Laterality: N/A;  . EYE SURGERY    . FRACTURE SURGERY    . HERNIA REPAIR    . LIPOMA EXCISION    . SPINE SURGERY       FAMILY HISTORY   Family History  Problem Relation Age of Onset  . Cancer Mother   . Varicose Veins Mother      SOCIAL HISTORY   Social History   Tobacco Use  . Smoking status: Never Smoker  . Smokeless tobacco: Never Used  Vaping Use  . Vaping Use: Never used  Substance Use Topics  . Alcohol use: Never  . Drug use: Never     MEDICATIONS   Scheduled Meds: . chlorhexidine gluconate (MEDLINE KIT)  15 mL Mouth Rinse BID  . Chlorhexidine Gluconate Cloth  6 each Topical Daily  . ciprofloxacin  1 drop Both Eyes Q4H  . docusate  200 mg Per Tube BID  . enoxaparin (LOVENOX) injection  0.5 mg/kg Subcutaneous Q24H  . feeding supplement (PROSource TF)  90 mL Per Tube TID  . furosemide  40 mg Intravenous Daily  . ipratropium-albuterol  3 mL Nebulization Q6H  . mouth rinse  15 mL Mouth Rinse 10 times per day  . pantoprazole sodium  40 mg Per Tube Daily  . polyethylene glycol  17 g Per Tube Daily  . sodium chloride flush  10-40 mL Intracatheter Q12H  .  sucralfate  1 g Per Tube TID WC & HS   Continuous Infusions: . sodium chloride 10 mL/hr at 09/30/20 0341  . dexmedetomidine (PRECEDEX) IV infusion Stopped (10/01/20 0629)  . feeding supplement (VITAL HIGH PROTEIN) 1,000 mL (09/30/20 0530)  . fentaNYL infusion INTRAVENOUS 150 mcg/hr (10/01/20 0939)  . propofol (DIPRIVAN) infusion 60 mcg/kg/min (10/01/20 1422)   PRN Meds:.acetaminophen, docusate sodium, fentaNYL (SUBLIMAZE) injection, labetalol, LORazepam,  polyethylene glycol, sodium chloride flush   ALLERGIES   Divalproex sodium and Valproic acid    PHYSICAL EXAMINATION   Vital Signs: Temp:  [99.5 F (37.5 C)-100.8 F (38.2 C)] 99.5 F (37.5 C) (01/02 1400) Pulse Rate:  [61-124] 124 (01/02 1400) Resp:  [15-22] 16 (01/02 1400) BP: (93-164)/(63-101) 156/79 (01/02 1400) SpO2:  [90 %-99 %] 96 % (01/02 1400) FiO2 (%):  [35 %-45 %] 45 % (01/02 1331) Weight:  [140 kg] 140 kg (01/02 0246)   Vent Mode: PRVC FiO2 (%):  [35 %-45 %] 45 % Set Rate:  [15 bmp-26 bmp] 15 bmp Vt Set:  [500 mL] 500 mL PEEP:  [5 cmH20] 5 cmH20 Plateau Pressure:  [16 cmH20-19 cmH20] 19 cmH20  Physical exam: Constitutional: chronically ill appearing man intubated mechanically ventilated, synchronous with the ventilator Eyes: Pupils equal Ears, nose, mouth, and throat: ETT in place, some thick secretions Cardiovascular: RRR, ext warm Respiratory: diminished bases, no accessory muscle use Gastrointestinal: Soft, hypoactive BS Skin: No rashes, normal turgor, no edema; peripheral vascular disease changes Neurologic: Does track when sedation is lightened does follow one-step commands  No imaging today    ASSESSMENT AND PLAN   Acute hypoxemic respiratory failure with LLL infiltrate/atelectasis CT chest not very impressive Acute metabolic encephalopathy related to critical illness, sedation Acute kidney injury due to ATN Class III obesity GERD Depression  Plan: -follow chem 7 -follow UO -continue Foley Catheter-assess need daily -Work on sedation wean and SBT as tolerated -7 days zosyn completed -Continues to fail SBT -Switch sedative to Precedex -Klonopin via tube -Palliative care consultation pending   Patient critically ill due to respiratory failure likely CAP, NOS  Risk of deterioration without these interventions is high  Updated patient's significant other, Eddie Chapman, at bedside  Care coordination was performed with RT, and bedside  nurse.  I personally spent 35 minutes providing critical care not including any separately billable procedures  C. Derrill Kay, MD [facility]   *This note was dictated using voice recognition software/Dragon.  Despite best efforts to proofread, errors can occur which can change the meaning.  Any change was purely unintentional.

## 2020-10-02 ENCOUNTER — Inpatient Hospital Stay: Payer: PPO

## 2020-10-02 DIAGNOSIS — Z515 Encounter for palliative care: Secondary | ICD-10-CM

## 2020-10-02 DIAGNOSIS — Z7189 Other specified counseling: Secondary | ICD-10-CM | POA: Diagnosis not present

## 2020-10-02 LAB — CBC WITH DIFFERENTIAL/PLATELET
Abs Immature Granulocytes: 0.02 10*3/uL (ref 0.00–0.07)
Basophils Absolute: 0 10*3/uL (ref 0.0–0.1)
Basophils Relative: 0 %
Eosinophils Absolute: 0 10*3/uL (ref 0.0–0.5)
Eosinophils Relative: 0 %
HCT: 42.8 % (ref 39.0–52.0)
Hemoglobin: 12.8 g/dL — ABNORMAL LOW (ref 13.0–17.0)
Immature Granulocytes: 0 %
Lymphocytes Relative: 8 %
Lymphs Abs: 0.6 10*3/uL — ABNORMAL LOW (ref 0.7–4.0)
MCH: 30 pg (ref 26.0–34.0)
MCHC: 29.9 g/dL — ABNORMAL LOW (ref 30.0–36.0)
MCV: 100.2 fL — ABNORMAL HIGH (ref 80.0–100.0)
Monocytes Absolute: 0.9 10*3/uL (ref 0.1–1.0)
Monocytes Relative: 12 %
Neutro Abs: 5.9 10*3/uL (ref 1.7–7.7)
Neutrophils Relative %: 80 %
Platelets: 153 10*3/uL (ref 150–400)
RBC: 4.27 MIL/uL (ref 4.22–5.81)
RDW: 12.5 % (ref 11.5–15.5)
WBC: 7.4 10*3/uL (ref 4.0–10.5)
nRBC: 0 % (ref 0.0–0.2)

## 2020-10-02 LAB — GLUCOSE, CAPILLARY
Glucose-Capillary: 110 mg/dL — ABNORMAL HIGH (ref 70–99)
Glucose-Capillary: 129 mg/dL — ABNORMAL HIGH (ref 70–99)
Glucose-Capillary: 155 mg/dL — ABNORMAL HIGH (ref 70–99)
Glucose-Capillary: 162 mg/dL — ABNORMAL HIGH (ref 70–99)
Glucose-Capillary: 165 mg/dL — ABNORMAL HIGH (ref 70–99)

## 2020-10-02 LAB — MAGNESIUM: Magnesium: 1.5 mg/dL — ABNORMAL LOW (ref 1.7–2.4)

## 2020-10-02 LAB — CULTURE, BLOOD (ROUTINE X 2)
Culture: NO GROWTH
Special Requests: ADEQUATE

## 2020-10-02 LAB — RENAL FUNCTION PANEL
Albumin: 2.2 g/dL — ABNORMAL LOW (ref 3.5–5.0)
Anion gap: 6 (ref 5–15)
BUN: 30 mg/dL — ABNORMAL HIGH (ref 6–20)
CO2: 36 mmol/L — ABNORMAL HIGH (ref 22–32)
Calcium: 7.3 mg/dL — ABNORMAL LOW (ref 8.9–10.3)
Chloride: 103 mmol/L (ref 98–111)
Creatinine, Ser: 0.49 mg/dL — ABNORMAL LOW (ref 0.61–1.24)
GFR, Estimated: >60 mL/min (ref >60)
Glucose, Bld: 139 mg/dL — ABNORMAL HIGH (ref 70–99)
Phosphorus: 1.9 mg/dL — ABNORMAL LOW (ref 2.5–4.6)
Potassium: 3.4 mmol/L — ABNORMAL LOW (ref 3.5–5.1)
Sodium: 145 mmol/L (ref 135–145)

## 2020-10-02 MED ORDER — POTASSIUM PHOSPHATES 15 MMOLE/5ML IV SOLN
30.0000 mmol | Freq: Once | INTRAVENOUS | Status: AC
  Administered 2020-10-02: 30 mmol via INTRAVENOUS
  Filled 2020-10-02: qty 10

## 2020-10-02 MED ORDER — MAGNESIUM SULFATE 4 GM/100ML IV SOLN
4.0000 g | Freq: Once | INTRAVENOUS | Status: AC
  Administered 2020-10-02: 4 g via INTRAVENOUS
  Filled 2020-10-02 (×2): qty 100

## 2020-10-02 NOTE — Progress Notes (Signed)
Prop gtt immediately stopped @ handoff. Pt remains on dex and fentanyl gtt's overnight. Pt opening eyes spontaneously, intermittently following simple commands. Foley remains in place, draining adequately. Bath complete, no bm overnight (small smear on pad). Turn q2. Pt in no acute distress @ this time. Will continue to monitor.

## 2020-10-02 NOTE — Progress Notes (Signed)
CRITICAL CARE NOTE    Name: Eddie Chapman MRN: [id number] DOB: [birth year]     LOS: 2   SUBJECTIVE FINDINGS & SIGNIFICANT EVENTS    Patient description:  60 yo M with hx of cerebral aneurysm, PVD, HFrEF, pulmonary htn, morbid obesity, OSA, GERD, hx of TBI, dyslipidemia who came in after girlfriend found him to be unresponsive.  He was found to be severely hypoxemic in circulatory shock with left lung infiltrate on CXR. Labwork consistent with sepsis.   Events:  09/22/20- patient is off vasopressors.  He is weaning on FIO2.  Family at bedside (girlfriend).  Patient is weaned to 40% FIO2. 09/23/20- patient failed SBT today. 09/25/20-patient with continued tenacious secretions and patient also with altered mentation.  Started Precedex in hopes to be able to wean Versed.  As patient extremely altered and difficult to arouse.  CT head and CT chest obtained 09/26/20-Pateint following commands intermittently but agitated.  SBT attempted this morning and failed. Patient was taking TV of 200 on 10/5. RR increased to 40. Placed back on rate and sedaiton increased.  09/27/2020-patient attempted another spontaneous breathing trial.  Patient becomes extremely agitated.  Patient takes shallow breaths and has high respiratory rate. 12/30- remains heavily sedated 09/29/2020: Poorly responsive when sedation is lightened, does not tolerate SBT 10/01/2019: Not tolerating SBT, will need goals of care addressed 10/02/2019: More responsive, still unable to tolerate SBT well 10/03/19- failed SBT today, palliative care evaluation in process   Lines/tubes :  PIVx2  PAST MEDICAL HISTORY   Past Medical History:  Diagnosis Date  . Adenomatous colon polyp   . Depression   . Ear drum perforation    left x2   . GERD (gastroesophageal  reflux disease)   . Hyperlipidemia   . Hypertension   . T8 vertebral fracture [facility])      SURGICAL HISTORY   Past Surgical History:  Procedure Laterality Date  . CARPAL TUNNEL RELEASE     left hand  . COLONOSCOPY  09/18/2009, 09/15/2014  . COLONOSCOPY WITH PROPOFOL N/A 12/29/2017   Procedure: COLONOSCOPY WITH PROPOFOL;  Surgeon: Manya Silvas, MD;  Location: [facility] ENDOSCOPY;  Service: Endoscopy;  Laterality: N/A;  . EYE SURGERY    . FRACTURE SURGERY    . HERNIA REPAIR    . LIPOMA EXCISION    . SPINE SURGERY       FAMILY HISTORY   Family History  Problem Relation Age of Onset  . Cancer Mother   . Varicose Veins Mother      SOCIAL HISTORY   Social History   Tobacco Use  . Smoking status: Never Smoker  . Smokeless tobacco: Never Used  Vaping Use  . Vaping Use: Never used  Substance Use Topics  . Alcohol use: Never  . Drug use: Never     MEDICATIONS   Scheduled Meds: . chlorhexidine gluconate (MEDLINE KIT)  15 mL Mouth Rinse BID  . Chlorhexidine Gluconate Cloth  6 each Topical Daily  . ciprofloxacin  1 drop Both Eyes Q4H  . clonazePAM  1 mg Per Tube BID  . docusate  200 mg Per Tube BID  . enoxaparin (LOVENOX) injection  0.5 mg/kg Subcutaneous Q24H  . feeding supplement (PROSource TF)  90 mL Per Tube TID  . furosemide  40 mg Intravenous Daily  . ipratropium-albuterol  3 mL Nebulization Q6H  . mouth rinse  15 mL Mouth Rinse 10 times per day  . pantoprazole sodium  40 mg Per Tube Daily  .  polyethylene glycol  17 g Per Tube Daily  . sodium chloride flush  10-40 mL Intracatheter Q12H  . sucralfate  1 g Per Tube TID WC & HS   Continuous Infusions: . sodium chloride 10 mL/hr at 09/30/20 0341  . dexmedetomidine (PRECEDEX) IV infusion 1.2 mcg/kg/hr (10/02/20 0912)  . feeding supplement (VITAL HIGH PROTEIN) 1,000 mL (10/01/20 1145)  . fentaNYL infusion INTRAVENOUS 325 mcg/hr (10/02/20 0414)  . potassium PHOSPHATE IVPB (in mmol) 30 mmol (10/02/20 0850)  .  propofol (DIPRIVAN) infusion Stopped (10/01/20 1905)   PRN Meds:.acetaminophen, docusate sodium, fentaNYL (SUBLIMAZE) injection, labetalol, LORazepam, polyethylene glycol, sodium chloride flush   ALLERGIES   Divalproex sodium and Valproic acid    PHYSICAL EXAMINATION   Vital Signs: Temp:  [99.5 F (37.5 C)-100.76 F (38.2 C)] 100.4 F (38 C) (01/03 0600) Pulse Rate:  [65-124] 90 (01/03 0600) Resp:  [15-23] 22 (01/03 0600) BP: (103-164)/(64-102) 142/92 (01/03 0600) SpO2:  [90 %-98 %] 98 % (01/03 0600) FiO2 (%):  [35 %-45 %] 40 % (01/03 0817) Weight:  [139.5 kg] 139.5 kg (01/03 0500)   Vent Mode: Spontaneous FiO2 (%):  [35 %-45 %] 40 % Set Rate:  [15 bmp] 15 bmp Vt Set:  [500 mL] 500 mL PEEP:  [5 cmH20] 5 cmH20 Pressure Support:  [10 cmH20] 10 cmH20 Plateau Pressure:  [21 cmH20-23 cmH20] 23 cmH20  Physical exam: Constitutional: chronically ill appearing man intubated mechanically ventilated, synchronous with the ventilator Eyes: Pupils equal Ears, nose, mouth, and throat: ETT in place, some thick secretions Cardiovascular: RRR, ext warm Respiratory: diminished bases, no accessory muscle use Gastrointestinal: Soft, hypoactive BS Skin: No rashes, normal turgor, no edema; peripheral vascular disease changes Neurologic: Does track when sedation is lightened does follow one-step commands  No imaging today    ASSESSMENT AND PLAN   Acute hypoxemic respiratory failure CT chest -impressive Left lung pneumonia  Acute metabolic encephalopathy related to critical illness, sedation Acute kidney injury due to ATN Class III obesity GERD Depression  Plan: -follow chem 7 -follow UO -continue Foley Catheter-assess need daily -Work on sedation wean and SBT as tolerated -7 days zosyn completed -Continues to fail SBT -Switch sedative to Precedex -Klonopin via tube -Palliative care consultation pending   Patient critically ill due to respiratory failure likely CAP,  NOS  Risk of deterioration without these interventions is high  Updated patient's significant other, Bubba Hales, at bedside  Care coordination was performed with RT, and bedside nurse.    Critical care provider statement:    Critical care time (minutes):  33   Critical care time was exclusive of:  Separately billable procedures and  treating other patients   Critical care was necessary to treat or prevent imminent or  life-threatening deterioration of the following conditions:  sepsis due to    Critical care was time spent personally by me on the following  activities:  Development of treatment plan with patient or surrogate,  discussions with consultants, evaluation of patient's response to  treatment, examination of patient, obtaining history from patient or  surrogate, ordering and performing treatments and interventions, ordering  and review of laboratory studies and re-evaluation of patient's condition   I assumed direction of critical care for this patient from another  provider in my specialty: no      *This note was dictated using voice recognition software/Dragon.  Despite best efforts to proofread, errors can occur which can change the meaning.  Any change was purely unintentional.

## 2020-10-02 NOTE — Progress Notes (Signed)
Pt is on sedation medications but alert and following commands. Sedation and Tube feeds on hold per Dr. Karna Christmas. Spontaneous breathing trial in progress. Significant other Nelva Bush updated via phone.

## 2020-10-02 NOTE — Progress Notes (Incomplete)
PHARMACY CONSULT NOTE - FOLLOW UP  Pharmacy Consult for Electrolyte Monitoring and Replacement   Recent Labs: Potassium (mmol/L)  Date Value  10/02/2020 3.4 (L)  05/04/2014 4.3   Magnesium (mg/dL)  Date Value  03/49/1791 1.5 (L)   Calcium (mg/dL)  Date Value  50/56/9794 7.3 (L)   Calcium, Total (mg/dL)  Date Value  80/16/5537 9.1   Albumin (g/dL)  Date Value  48/27/0786 2.2 (L)   Phosphorus (mg/dL)  Date Value  75/44/9201 1.9 (L)   Sodium (mmol/L)  Date Value  10/02/2020 145  05/04/2014 140     Assessment: ***  Goal of Therapy:  ***  Plan:  4 grams mag sulfate 30 mmol K-phos  Lowella Bandy ,PharmD Clinical Pharmacist 10/02/2020 7:40 AM

## 2020-10-02 NOTE — Consult Note (Cosign Needed Addendum)
Consultation Note Date: 10/02/2020   Patient Name: Eddie Chapman  DOB: [birth year]  MRN: [id number]  Age / Sex: 60 y.o., male  PCP: Adin Hector, MD Referring Physician: Flora Lipps, MD  Reason for Consultation: Establishing goals of care  HPI/Patient Profile: 60 yo M with hx of cerebral aneurysm, PVD, HFrEF, pulmonary htn, morbid obesity, OSA, GERD, hx of TBI, dyslipidemia who came in after girlfriend found him to be unresponsive.  He was found to be severely hypoxemic in circulatory shock with left lung infiltrate on CXR. Labwork consistent with sepsis.  Clinical Assessment and Goals of Care:  Patient is resting in bed on ventilator. Girlfriend at bedside. She states they have been together 6 years. She states he has no children. His mother is dead, his father is alive. He has 2 siblings of which he does not speak to.   She states until he fell off a roof in 2016, he was" normal". She states he cannot drive any longer, and he takes extra time with making decisions.   Upon communicating with patient, he shook and nodded his head to indicate his wishes.He indicates he wants the breathing tube removed. He indicates he does not want it replaced. He does want to wait a few days before being extubated. Attempted to readress these questions at the end of the conversation to be sure of his responses, and he did not shake or nod his head.   Spoke with his father. 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 He states "he is not the person I raised." Father states he is elderly, and cannot come because of covid concerns. He states he is the decision maker for his son's care. He states this information is sudden and he will need a day to process. Will follow up tomorrow.           SUMMARY OF RECOMMENDATIONS   Will f/u tomorrow.       Primary  Diagnoses: Present on Admission: . Septic shock ([place])   I have reviewed the medical record, interviewed the patient and family, and examined the patient. The following aspects are pertinent.  Past Medical History:  Diagnosis Date  . Adenomatous colon polyp   . Depression   . Ear drum perforation    left x2   . GERD (gastroesophageal reflux disease)   . Hyperlipidemia   . Hypertension   . T8 vertebral fracture ([facility])    Social History   Socioeconomic History  . Marital status: Single    Spouse name: Not on file  . Number of children: Not on file  . Years of education: Not on file  . Highest education level: Not on file  Occupational History  . Not on file  Tobacco Use  . Smoking status: Never Smoker  . Smokeless tobacco: Never Used  Vaping Use  . Vaping Use: Never used  Substance and Sexual Activity  . Alcohol use: Never  .  Drug use: Never  . Sexual activity: Not Currently    Partners: Female    Birth control/protection: None  Other Topics Concern  . Not on file  Social History Narrative  . Not on file   Social Determinants of Health   Financial Resource Strain: Not on file  Food Insecurity: Not on file  Transportation Needs: Not on file  Physical Activity: Not on file  Stress: Not on file  Social Connections: Not on file   Family History  Problem Relation Age of Onset  . Cancer Mother   . Varicose Veins Mother    Scheduled Meds: . chlorhexidine gluconate (MEDLINE KIT)  15 mL Mouth Rinse BID  . Chlorhexidine Gluconate Cloth  6 each Topical Daily  . ciprofloxacin  1 drop Both Eyes Q4H  . clonazePAM  1 mg Per Tube BID  . docusate  200 mg Per Tube BID  . enoxaparin (LOVENOX) injection  0.5 mg/kg Subcutaneous Q24H  . feeding supplement (PROSource TF)  90 mL Per Tube TID  . furosemide  40 mg Intravenous Daily  . ipratropium-albuterol  3 mL Nebulization Q6H  . mouth rinse  15 mL Mouth Rinse 10 times per day  . pantoprazole sodium  40 mg Per Tube Daily  .  polyethylene glycol  17 g Per Tube Daily  . sodium chloride flush  10-40 mL Intracatheter Q12H  . sucralfate  1 g Per Tube TID WC & HS   Continuous Infusions: . sodium chloride 10 mL/hr at 09/30/20 0341  . dexmedetomidine (PRECEDEX) IV infusion 1.2 mcg/kg/hr (10/02/20 0912)  . feeding supplement (VITAL HIGH PROTEIN) 1,000 mL (10/01/20 1145)  . fentaNYL infusion INTRAVENOUS 325 mcg/hr (10/02/20 0414)  . propofol (DIPRIVAN) infusion Stopped (10/01/20 1905)   PRN Meds:.acetaminophen, docusate sodium, fentaNYL (SUBLIMAZE) injection, labetalol, LORazepam, polyethylene glycol, sodium chloride flush Medications Prior to Admission:  Prior to Admission medications   Medication Sig Start Date End Date Taking? Authorizing Provider  HYDROcodone-acetaminophen (NORCO/VICODIN) 5-325 MG tablet  07/31/16  Yes [provider]  lovastatin (MEVACOR) 40 MG tablet 40 mg. 2 tablets at bedtime 08/01/16  Yes [provider]  metoprolol succinate (TOPROL-XL) 100 MG 24 hr tablet Take 100 mg by mouth daily. 07/23/20  Yes [provider]  omeprazole (PRILOSEC) 20 MG capsule Take by mouth.   Yes [provider]  spironolactone (ALDACTONE) 25 MG tablet Take 25 mg by mouth daily. 08/31/20  Yes [provider]  telmisartan (MICARDIS) 20 MG tablet Take 20 mg by mouth daily. 07/07/20  Yes [provider]  aspirin EC 81 MG tablet Take 162 mg by mouth. Patient not taking: Reported on 09/21/2020    [provider]  fluticasone (FLONASE) 50 MCG/ACT nasal spray Place into the nose. Patient not taking: Reported on 09/21/2020    [provider]  meclizine (ANTIVERT) 25 MG tablet Take 25 mg by mouth 3 (three) times daily as needed for dizziness.    [provider]  propranolol (INDERAL) 40 MG tablet Take 20 mg by mouth 2 (two) times daily.  Patient not taking: Reported on 09/21/2020 08/17/15   [provider]  sacubitril-valsartan (ENTRESTO)  24-26 MG Take 1 tablet by mouth 2 (two) times daily.    [provider]  traZODone (DESYREL) 50 MG tablet Take by mouth. 11/29/15 12/29/17  [provider]   Allergies  Allergen Reactions  . Divalproex Sodium Other (See Comments)    Elevated liver enzymes  . Valproic Acid     unknown  Review of Systems  Unable to perform ROS   Physical Exam Pulmonary:     Comments: On ventilator.  Neurological:     Mental Status: He is alert.     Vital Signs: BP (!) 179/106   Pulse (!) 38   Temp (!) 100.4 F (38 C)   Resp (!) 33   Ht _0  (1.778 m)   Wt (!) 139.5 kg   SpO2 (!) 87%   BMI 44.13 kg/m  Pain Scale: CPOT   Pain Score: 0-No pain   SpO2: SpO2: (!) 87 % O2 Device:SpO2: (!) 87 % O2 Flow Rate: .   IO: Intake/output summary:   Intake/Output Summary (Last 24 hours) at 10/02/2020 1545 Last data filed at 10/02/2020 8381 Gross per 24 hour  Intake 1641.11 ml  Output 1350 ml  Net 291.11 ml    LBM: Last BM Date: 09/29/20 Baseline Weight: Weight: (!) 147.4 kg Most recent weight: Weight: (!) 139.5 kg     Palliative Assessment/Data:     Time In: 3:05 Time Out: 4:15 Time Total: 70 min Greater than 50%  of this time was spent counseling and coordinating care related to the above assessment and plan.  Signed by: Asencion Gowda, NP   Please contact Palliative Medicine Team phone at [phone] for questions and concerns.  For individual provider: See Shea Evans

## 2020-10-02 NOTE — Progress Notes (Signed)
Nutrition Follow Up Note   DOCUMENTATION CODES:   Morbid obesity  INTERVENTION:   Continue Vital High Protein at 60 mL/hr (1440 mL goal daily volume) + PROSource TF 90 mL TID per tube.   Regimen provides 1680 kcal, 192 grams of protein, 1210 mL H2O daily.  NUTRITION DIAGNOSIS:   Inadequate oral intake related to inability to eat as evidenced by NPO status.  GOAL:   Patient will meet greater than or equal to 90% of their needs  -met with tube feeds  MONITOR:   Vent status,Labs,Weight trends,TF tolerance,I & O's  ASSESSMENT:   60 year old male with PMHx of cerebral aneurysm, TBI, HLD, HTN, depression, GERD, HFrEF, OSA admitted with severe hypoxemia, circulatory shock, left lung consolidated PNA.   Pt s/p PICC line 12/31  Pt remains sedated and ventilated. OGT in place. Pt failed SBTs. Pt tolerating tube feeds at goal rate. Pt is refeeding; electrolytes being replaced. Propofol stopped. Palliative care consult pending. Per chart, pt is down ~12lbs(4%) since admit; pt currently down ~7lbs from his UBW.   Medications reviewed and include: ciprofloxacin, colace, lovenox, lasix, protonix, miralax, carafate, precedex, fentanyl, Kphos  Labs reviewed: K 3.4(L), BUN 30(H), creat 0.49(L), P 1.9(L), Mg 1.5(L) cbgs- 108, 115, 180, 144, 155 x 24 hrs  Patient is currently intubated on ventilator support MV: 11.0 L/min Temp (24hrs), Avg:100.1 F (37.8 C), Min:99.5 F (37.5 C), Max:100.76 F (38.2 C)  Propofol: none  MAP- >69mHg  UOP- 34067m Diet Order:   Diet Order    None     EDUCATION NEEDS:   No education needs have been identified at this time  Skin:  Skin Assessment: Reviewed RN Assessment  Last BM:  12/31  Height:   Ht Readings from Last 1 Encounters:  09/21/20 _0  (1.778 m)   Weight:   Wt Readings from Last 1 Encounters:  10/02/20 (!) 139.5 kg   Ideal Body Weight:  75.5 kg  BMI:  Body mass index is 44.13 kg/m.  Estimated Nutritional Needs:    Kcal:  1595-2030 (11-14 grams/kg)  Protein:  up to 189 grams (2.5 grams/kg IBW)  Fluid:  2-2.3 L/day  CaKoleen DistanceS, RD, LDN Please refer to [facility] and/or RD on-call/weekend/after hours pager

## 2020-10-02 NOTE — Progress Notes (Signed)
SBT ended, per palliative note family declined to extubate at this time. Pt noted with increased copius secretions pouring from his mouth and nose, suctioned with good results. Pt restless tachycardic and hypertensive. Vent alarming high, RT present.   prn ativan given and sedation resumed.

## 2020-10-02 NOTE — Progress Notes (Signed)
Several attempts made to reach father Eddie Chapman to update on patient condition and plan of care. NO answer at numbers listed: [phone], [phone]. Unable to leave voice message. I did speak with father briefly on 10/01/20 to update.

## 2020-10-03 DIAGNOSIS — Z515 Encounter for palliative care: Secondary | ICD-10-CM | POA: Diagnosis not present

## 2020-10-03 DIAGNOSIS — Z7189 Other specified counseling: Secondary | ICD-10-CM | POA: Diagnosis not present

## 2020-10-03 LAB — CBC WITH DIFFERENTIAL/PLATELET
Abs Immature Granulocytes: 0.03 10*3/uL (ref 0.00–0.07)
Basophils Absolute: 0 10*3/uL (ref 0.0–0.1)
Basophils Relative: 0 %
Eosinophils Absolute: 0 10*3/uL (ref 0.0–0.5)
Eosinophils Relative: 0 %
HCT: 42.1 % (ref 39.0–52.0)
Hemoglobin: 12.6 g/dL — ABNORMAL LOW (ref 13.0–17.0)
Immature Granulocytes: 0 %
Lymphocytes Relative: 10 %
Lymphs Abs: 0.8 10*3/uL (ref 0.7–4.0)
MCH: 30.1 pg (ref 26.0–34.0)
MCHC: 29.9 g/dL — ABNORMAL LOW (ref 30.0–36.0)
MCV: 100.5 fL — ABNORMAL HIGH (ref 80.0–100.0)
Monocytes Absolute: 0.9 10*3/uL (ref 0.1–1.0)
Monocytes Relative: 12 %
Neutro Abs: 6 10*3/uL (ref 1.7–7.7)
Neutrophils Relative %: 78 %
Platelets: 153 10*3/uL (ref 150–400)
RBC: 4.19 MIL/uL — ABNORMAL LOW (ref 4.22–5.81)
RDW: 12.5 % (ref 11.5–15.5)
WBC: 7.7 10*3/uL (ref 4.0–10.5)
nRBC: 0 % (ref 0.0–0.2)

## 2020-10-03 LAB — RENAL FUNCTION PANEL
Albumin: 2.1 g/dL — ABNORMAL LOW (ref 3.5–5.0)
Anion gap: 7 (ref 5–15)
BUN: 25 mg/dL — ABNORMAL HIGH (ref 6–20)
CO2: 36 mmol/L — ABNORMAL HIGH (ref 22–32)
Calcium: 7.4 mg/dL — ABNORMAL LOW (ref 8.9–10.3)
Chloride: 103 mmol/L (ref 98–111)
Creatinine, Ser: 0.48 mg/dL — ABNORMAL LOW (ref 0.61–1.24)
GFR, Estimated: >60 mL/min (ref >60)
Glucose, Bld: 122 mg/dL — ABNORMAL HIGH (ref 70–99)
Phosphorus: 2.6 mg/dL (ref 2.5–4.6)
Potassium: 3.6 mmol/L (ref 3.5–5.1)
Sodium: 146 mmol/L — ABNORMAL HIGH (ref 135–145)

## 2020-10-03 LAB — MAGNESIUM: Magnesium: 1.7 mg/dL (ref 1.7–2.4)

## 2020-10-03 LAB — GLUCOSE, CAPILLARY
Glucose-Capillary: 109 mg/dL — ABNORMAL HIGH (ref 70–99)
Glucose-Capillary: 130 mg/dL — ABNORMAL HIGH (ref 70–99)
Glucose-Capillary: 134 mg/dL — ABNORMAL HIGH (ref 70–99)
Glucose-Capillary: 139 mg/dL — ABNORMAL HIGH (ref 70–99)
Glucose-Capillary: 142 mg/dL — ABNORMAL HIGH (ref 70–99)
Glucose-Capillary: 154 mg/dL — ABNORMAL HIGH (ref 70–99)

## 2020-10-03 MED ORDER — MAGNESIUM SULFATE 2 GM/50ML IV SOLN
2.0000 g | Freq: Once | INTRAVENOUS | Status: AC
  Administered 2020-10-03: 2 g via INTRAVENOUS
  Filled 2020-10-03: qty 50

## 2020-10-03 MED ORDER — FUROSEMIDE 10 MG/ML IJ SOLN
40.0000 mg | Freq: Once | INTRAMUSCULAR | Status: AC
  Administered 2020-10-03: 40 mg via INTRAVENOUS

## 2020-10-03 NOTE — Progress Notes (Signed)
Daily Progress Note   Patient Name: Eddie Chapman       Date: 10/03/2020 DOB: [birth year]  Age: 60 y.o. MRN#: [id number] Attending Physician: Flora Lipps, MD Primary Care Physician: Adin Hector, MD Admit Date: 09/21/2020  Reason for Consultation/Follow-up: Establishing goals of care  Subjective: Patient is resting in bed on ventilator support. Spoke with father via phone. He states he is planning to come tomorrow to see him and spend time with him before making decisions. He states patient's sister has been talking about getting a second opinion and about options she read about online. We discussed his decline over the past year per girlfriend, and that he does not want to wear his CPAP.  We discussed ensuring we are focusing on what Eddie Chapman would want with upcoming care.    Length of Stay: 12  Current Medications: Scheduled Meds:  . chlorhexidine gluconate (MEDLINE KIT)  15 mL Mouth Rinse BID  . Chlorhexidine Gluconate Cloth  6 each Topical Daily  . ciprofloxacin  1 drop Both Eyes Q4H  . clonazePAM  1 mg Per Tube BID  . docusate  200 mg Per Tube BID  . enoxaparin (LOVENOX) injection  0.5 mg/kg Subcutaneous Q24H  . feeding supplement (PROSource TF)  90 mL Per Tube TID  . furosemide  40 mg Intravenous Daily  . ipratropium-albuterol  3 mL Nebulization Q6H  . mouth rinse  15 mL Mouth Rinse 10 times per day  . pantoprazole sodium  40 mg Per Tube Daily  . polyethylene glycol  17 g Per Tube Daily  . sodium chloride flush  10-40 mL Intracatheter Q12H  . sucralfate  1 g Per Tube TID WC & HS    Continuous Infusions: . sodium chloride 10 mL/hr at 09/30/20 0341  . dexmedetomidine (PRECEDEX) IV infusion 0.6 mcg/kg/hr (10/03/20 1200)  . feeding supplement (VITAL HIGH PROTEIN) Stopped (10/03/20  0800)  . fentaNYL infusion INTRAVENOUS Stopped (10/03/20 0900)  . propofol (DIPRIVAN) infusion Stopped (10/01/20 1902)    PRN Meds: acetaminophen, docusate sodium, fentaNYL (SUBLIMAZE) injection, labetalol, LORazepam, polyethylene glycol, sodium chloride flush  Physical Exam Constitutional:      Comments: On ventilator.              Vital Signs: BP 138/83   Pulse (!) 51   Temp (!) 100.4  F (38 C)   Resp (!) 30   Ht 5' 10" (1.778 m)   Wt (!) 140.2 kg   SpO2 97%   BMI 44.35 kg/m  SpO2: SpO2: 97 % O2 Device: O2 Device: Ventilator O2 Flow Rate:    Intake/output summary:   Intake/Output Summary (Last 24 hours) at 10/03/2020 1325 Last data filed at 10/03/2020 1200 Gross per 24 hour  Intake 2843.71 ml  Output 2250 ml  Net 593.71 ml   LBM: Last BM Date: 09/29/20 Baseline Weight: Weight: (!) 147.4 kg Most recent weight: Weight: (!) 140.2 kg        Patient Active Problem List   Diagnosis Date Noted  . Septic shock ([facility]) 09/21/2020  . Occlusion and stenosis of vertebral artery 08/19/2016  . Aneurysm, cerebral, nonruptured 08/19/2016  . Essential hypertension 08/19/2016  . Hyperlipidemia 08/19/2016    Palliative Care Assessment & Plan    Recommendations/Plan:  Father is coming tomorrow.   Code Status:    Code Status Orders  (From admission, onward)         Start     Ordered   09/21/20 1523  Full code  Continuous        09/21/20 1524        Code Status History    This patient has a current code status but no historical code status.   Advance Care Planning Activity       Prognosis:   Unable to determine   Thank you for allowing the Palliative Medicine Team to assist in the care of this patient.   Total Time 15 min Prolonged Time Billed no      Greater than 50%  of this time was spent counseling and coordinating care related to the above assessment and plan.   , NP  Please contact Palliative Medicine Team phone at [phone]  for questions and concerns.      

## 2020-10-03 NOTE — Progress Notes (Signed)
CRITICAL CARE NOTE    Name: Eddie Chapman MRN: [id number] DOB: 07/20/1961     LOS: 30   SUBJECTIVE FINDINGS & SIGNIFICANT EVENTS    Patient description:  60 yo M with hx of cerebral aneurysm, PVD, HFrEF, pulmonary htn, morbid obesity, OSA, GERD, hx of TBI, dyslipidemia who came in after girlfriend found him to be unresponsive.  He was found to be severely hypoxemic in circulatory shock with left lung infiltrate on CXR. Labwork consistent with sepsis.   Events:  09/22/20- patient is off vasopressors.  He is weaning on FIO2.  Family at bedside (girlfriend).  Patient is weaned to 40% FIO2. 09/23/20- patient failed SBT today. 09/25/20-patient with continued tenacious secretions and patient also with altered mentation.  Started Precedex in hopes to be able to wean Versed.  As patient extremely altered and difficult to arouse.  CT head and CT chest obtained 09/26/20-Pateint following commands intermittently but agitated.  SBT attempted this morning and failed. Patient was taking TV of 200 on 10/5. RR increased to 40. Placed back on rate and sedaiton increased.  09/27/2020-patient attempted another spontaneous breathing trial.  Patient becomes extremely agitated.  Patient takes shallow breaths and has high respiratory rate. 12/30- remains heavily sedated 09/29/2020: Poorly responsive when sedation is lightened, does not tolerate SBT 10/01/2019: Not tolerating SBT, will need goals of care addressed 10/02/2019: More responsive, still unable to tolerate SBT well 10/03/19- failed SBT today, palliative care evaluation in process 10/04/19- patient passed SBT and was extubated to BIPAP. I spoke with patients father and met with GF at bedside for a second update.    Lines/tubes :  PIVx2  PAST MEDICAL HISTORY   Past Medical  History:  Diagnosis Date  . Adenomatous colon polyp   . Depression   . Ear drum perforation    left x2   . GERD (gastroesophageal reflux disease)   . Hyperlipidemia   . Hypertension   . T8 vertebral fracture [facility])      SURGICAL HISTORY   Past Surgical History:  Procedure Laterality Date  . CARPAL TUNNEL RELEASE     left hand  . COLONOSCOPY  09/18/2009, 09/15/2014  . COLONOSCOPY WITH PROPOFOL N/A 12/29/2017   Procedure: COLONOSCOPY WITH PROPOFOL;  Surgeon: Manya Silvas, MD;  Location: [facility] ENDOSCOPY;  Service: Endoscopy;  Laterality: N/A;  . EYE SURGERY    . FRACTURE SURGERY    . HERNIA REPAIR    . LIPOMA EXCISION    . SPINE SURGERY       FAMILY HISTORY   Family History  Problem Relation Age of Onset  . Cancer Mother   . Varicose Veins Mother      SOCIAL HISTORY   Social History   Tobacco Use  . Smoking status: Never Smoker  . Smokeless tobacco: Never Used  Vaping Use  . Vaping Use: Never used  Substance Use Topics  . Alcohol use: Never  . Drug use: Never     MEDICATIONS   Scheduled Meds: . chlorhexidine gluconate (MEDLINE KIT)  15 mL Mouth Rinse BID  . Chlorhexidine Gluconate Cloth  6 each Topical Daily  . ciprofloxacin  1 drop Both Eyes Q4H  . clonazePAM  1 mg Per Tube BID  . docusate  200 mg Per Tube BID  . enoxaparin (LOVENOX) injection  0.5 mg/kg Subcutaneous Q24H  . feeding supplement (PROSource TF)  90 mL Per Tube TID  . furosemide  40 mg Intravenous Daily  . ipratropium-albuterol  3 mL Nebulization  Q6H  . mouth rinse  15 mL Mouth Rinse 10 times per day  . pantoprazole sodium  40 mg Per Tube Daily  . polyethylene glycol  17 g Per Tube Daily  . sodium chloride flush  10-40 mL Intracatheter Q12H  . sucralfate  1 g Per Tube TID WC & HS   Continuous Infusions: . sodium chloride 10 mL/hr at 09/30/20 0341  . dexmedetomidine (PRECEDEX) IV infusion Stopped (10/03/20 1300)  . feeding supplement (VITAL HIGH PROTEIN) Stopped (10/03/20 0800)  .  fentaNYL infusion INTRAVENOUS Stopped (10/03/20 0900)  . propofol (DIPRIVAN) infusion Stopped (10/01/20 1902)   PRN Meds:.acetaminophen, docusate sodium, fentaNYL (SUBLIMAZE) injection, labetalol, LORazepam, polyethylene glycol, sodium chloride flush   ALLERGIES   Divalproex sodium and Valproic acid    PHYSICAL EXAMINATION   Vital Signs: Temp:  [100.4 F (38 C)-100.94 F (38.3 C)] 100.4 F (38 C) (01/04 1600) Pulse Rate:  [51-124] 100 (01/04 1600) Resp:  [15-30] 29 (01/04 1600) BP: (99-161)/(68-102) 140/99 (01/04 1600) SpO2:  [91 %-99 %] 92 % (01/04 1600) FiO2 (%):  [40 %-45 %] 45 % (01/04 1206) Weight:  [140.2 kg] 140.2 kg (01/04 0500)   Vent Mode: Spontaneous FiO2 (%):  [40 %-45 %] 45 % Set Rate:  [15 bmp] 15 bmp Vt Set:  [500 mL] 500 mL PEEP:  [5 cmH20] 5 cmH20 Pressure Support:  [12 cmH20] 12 cmH20 Plateau Pressure:  [16 cmH20-18 cmH20] 16 cmH20  Physical exam: Constitutional: chronically ill appearing man intubated mechanically ventilated, synchronous with the ventilator Eyes: Pupils equal Ears, nose, mouth, and throat:on BIPAP Cardiovascular: RRR, ext warm Respiratory: diminished bases, no accessory muscle use Gastrointestinal: Soft, hypoactive BS Skin: No rashes, normal turgor, no edema; peripheral vascular disease changes Neurologic:follows verbal communication but slowly and with 2/4 strength x 4  No imaging today    ASSESSMENT AND PLAN   Acute hypoxemic respiratory failure CT chest -impressive Left lung pneumonia  Acute metabolic encephalopathy related to critical illness, sedation Acute kidney injury due to ATN Class III obesity GERD Depression  Plan: -follow chem 7 -follow UO -continue Foley Catheter-assess need daily -Work on sedation wean and SBT as tolerated -7 days zosyn completed -Continues to fail SBT -Switch sedative to Precedex -Klonopin via tube    Patient critically ill due to respiratory failure likely CAP, NOS  Risk of  deterioration without these interventions is high  Updated patient's significant other, Eddie Chapman, at bedside  Care coordination was performed with RT, and bedside nurse.    Critical care provider statement:    Critical care time (minutes):  33   Critical care time was exclusive of:  Separately billable procedures and  treating other patients   Critical care was necessary to treat or prevent imminent or  life-threatening deterioration of the following conditions:  sepsis due to    Critical care was time spent personally by me on the following  activities:  Development of treatment plan with patient or surrogate,  discussions with consultants, evaluation of patient's response to  treatment, examination of patient, obtaining history from patient or  surrogate, ordering and performing treatments and interventions, ordering  and review of laboratory studies and re-evaluation of patient's condition   I assumed direction of critical care for this patient from another  provider in my specialty: no      *This note was dictated using voice recognition software/Dragon.  Despite best efforts to proofread, errors can occur which can change the meaning.  Any change was purely unintentional.  Ottie Glazier, M.D.  Pulmonary [facility] [place]

## 2020-10-03 NOTE — Progress Notes (Signed)
Pt remains on Dex and Fent gtt's overnight. Pt remains RAAS -1, intermittently follows simple commands & nods appropriately. Mag replaced this AM via IV. CHG bath completed. Turn q2. Pt in no acute distress @ this time. Will continue to monitor.

## 2020-10-04 DIAGNOSIS — Z7189 Other specified counseling: Secondary | ICD-10-CM | POA: Diagnosis not present

## 2020-10-04 DIAGNOSIS — J9601 Acute respiratory failure with hypoxia: Secondary | ICD-10-CM | POA: Diagnosis not present

## 2020-10-04 DIAGNOSIS — R6521 Severe sepsis with septic shock: Secondary | ICD-10-CM | POA: Diagnosis not present

## 2020-10-04 DIAGNOSIS — Z515 Encounter for palliative care: Secondary | ICD-10-CM | POA: Diagnosis not present

## 2020-10-04 DIAGNOSIS — A419 Sepsis, unspecified organism: Secondary | ICD-10-CM | POA: Diagnosis not present

## 2020-10-04 LAB — CBC WITH DIFFERENTIAL/PLATELET
Abs Immature Granulocytes: 0.06 10*3/uL (ref 0.00–0.07)
Basophils Absolute: 0 10*3/uL (ref 0.0–0.1)
Basophils Relative: 0 %
Eosinophils Absolute: 0 10*3/uL (ref 0.0–0.5)
Eosinophils Relative: 0 %
HCT: 50 % (ref 39.0–52.0)
Hemoglobin: 15.6 g/dL (ref 13.0–17.0)
Immature Granulocytes: 0 %
Lymphocytes Relative: 4 %
Lymphs Abs: 0.7 10*3/uL (ref 0.7–4.0)
MCH: 30.5 pg (ref 26.0–34.0)
MCHC: 31.2 g/dL (ref 30.0–36.0)
MCV: 97.8 fL (ref 80.0–100.0)
Monocytes Absolute: 1.5 10*3/uL — ABNORMAL HIGH (ref 0.1–1.0)
Monocytes Relative: 10 %
Neutro Abs: 13.9 10*3/uL — ABNORMAL HIGH (ref 1.7–7.7)
Neutrophils Relative %: 86 %
Platelets: 217 10*3/uL (ref 150–400)
RBC: 5.11 MIL/uL (ref 4.22–5.81)
RDW: 12.9 % (ref 11.5–15.5)
WBC: 16.2 10*3/uL — ABNORMAL HIGH (ref 4.0–10.5)
nRBC: 0 % (ref 0.0–0.2)

## 2020-10-04 LAB — GLUCOSE, CAPILLARY
Glucose-Capillary: 123 mg/dL — ABNORMAL HIGH (ref 70–99)
Glucose-Capillary: 132 mg/dL — ABNORMAL HIGH (ref 70–99)
Glucose-Capillary: 135 mg/dL — ABNORMAL HIGH (ref 70–99)
Glucose-Capillary: 135 mg/dL — ABNORMAL HIGH (ref 70–99)
Glucose-Capillary: 145 mg/dL — ABNORMAL HIGH (ref 70–99)
Glucose-Capillary: 156 mg/dL — ABNORMAL HIGH (ref 70–99)

## 2020-10-04 LAB — RENAL FUNCTION PANEL
Albumin: 3 g/dL — ABNORMAL LOW (ref 3.5–5.0)
Anion gap: 11 (ref 5–15)
BUN: 35 mg/dL — ABNORMAL HIGH (ref 6–20)
CO2: 41 mmol/L — ABNORMAL HIGH (ref 22–32)
Calcium: 9.4 mg/dL (ref 8.9–10.3)
Chloride: 97 mmol/L — ABNORMAL LOW (ref 98–111)
Creatinine, Ser: 0.74 mg/dL (ref 0.61–1.24)
GFR, Estimated: >60 mL/min (ref >60)
Glucose, Bld: 123 mg/dL — ABNORMAL HIGH (ref 70–99)
Phosphorus: 3.1 mg/dL (ref 2.5–4.6)
Potassium: 3.3 mmol/L — ABNORMAL LOW (ref 3.5–5.1)
Sodium: 149 mmol/L — ABNORMAL HIGH (ref 135–145)

## 2020-10-04 LAB — MAGNESIUM: Magnesium: 2.1 mg/dL (ref 1.7–2.4)

## 2020-10-04 MED ORDER — VANCOMYCIN HCL 1500 MG/300ML IV SOLN
1500.0000 mg | Freq: Two times a day (BID) | INTRAVENOUS | Status: DC
  Administered 2020-10-05 – 2020-10-06 (×3): 1500 mg via INTRAVENOUS
  Filled 2020-10-04 (×4): qty 300

## 2020-10-04 MED ORDER — HYDRALAZINE HCL 20 MG/ML IJ SOLN
10.0000 mg | INTRAMUSCULAR | Status: DC | PRN
  Administered 2020-10-04 – 2020-10-13 (×4): 20 mg via INTRAVENOUS
  Administered 2020-10-13: 10 mg via INTRAVENOUS
  Filled 2020-10-04 (×5): qty 1

## 2020-10-04 MED ORDER — SODIUM CHLORIDE 0.9 % IV SOLN
1.0000 g | Freq: Three times a day (TID) | INTRAVENOUS | Status: AC
  Administered 2020-10-04 – 2020-10-11 (×20): 1 g via INTRAVENOUS
  Filled 2020-10-04 (×23): qty 1

## 2020-10-04 MED ORDER — POTASSIUM CHLORIDE 10 MEQ/50ML IV SOLN
10.0000 meq | INTRAVENOUS | Status: AC
  Administered 2020-10-04 (×3): 10 meq via INTRAVENOUS
  Filled 2020-10-04 (×3): qty 50

## 2020-10-04 MED ORDER — VANCOMYCIN HCL 500 MG/100ML IV SOLN
500.0000 mg | Freq: Once | INTRAVENOUS | Status: AC
  Administered 2020-10-04: 500 mg via INTRAVENOUS
  Filled 2020-10-04: qty 100

## 2020-10-04 MED ORDER — VANCOMYCIN HCL 2000 MG/400ML IV SOLN
2000.0000 mg | Freq: Once | INTRAVENOUS | Status: AC
  Administered 2020-10-04: 2000 mg via INTRAVENOUS
  Filled 2020-10-04: qty 400

## 2020-10-04 MED ORDER — NEPRO/CARBSTEADY PO LIQD
237.0000 mL | Freq: Three times a day (TID) | ORAL | Status: DC
  Administered 2020-10-04 – 2020-10-06 (×7): 237 mL via ORAL

## 2020-10-04 MED ORDER — ADULT MULTIVITAMIN W/MINERALS CH
1.0000 | ORAL_TABLET | Freq: Every day | ORAL | Status: DC
  Administered 2020-10-05 – 2020-10-09 (×4): 1 via ORAL
  Filled 2020-10-04 (×5): qty 1

## 2020-10-04 MED ORDER — IPRATROPIUM-ALBUTEROL 0.5-2.5 (3) MG/3ML IN SOLN
3.0000 mL | RESPIRATORY_TRACT | Status: DC
  Administered 2020-10-04 – 2020-10-07 (×14): 3 mL via RESPIRATORY_TRACT
  Filled 2020-10-04 (×15): qty 3

## 2020-10-04 NOTE — Evaluation (Addendum)
Clinical/Bedside Swallow Evaluation Patient Details  Name: Eddie Chapman MRN: [id number] Date of Birth: 1960-11-28  Today's Date: 10/04/2020 Time: SLP Start Time (ACUTE ONLY): 87 SLP Stop Time (ACUTE ONLY): 1125 SLP Time Calculation (min) (ACUTE ONLY): 50 min  Past Medical History:  Past Medical History:  Diagnosis Date  . Adenomatous colon polyp   . Depression   . Ear drum perforation    left x2   . GERD (gastroesophageal reflux disease)   . Hyperlipidemia   . Hypertension   . T8 vertebral fracture [facility])    Past Surgical History:  Past Surgical History:  Procedure Laterality Date  . CARPAL TUNNEL RELEASE     left hand  . COLONOSCOPY  09/18/2009, 09/15/2014  . COLONOSCOPY WITH PROPOFOL N/A 12/29/2017   Procedure: COLONOSCOPY WITH PROPOFOL;  Surgeon: Manya Silvas, MD;  Location: [facility] ENDOSCOPY;  Service: Endoscopy;  Laterality: N/A;  . EYE SURGERY    . FRACTURE SURGERY    . HERNIA REPAIR    . LIPOMA EXCISION    . SPINE SURGERY     HPI:  Pt is a 60 yo M with hx of cerebral aneurysm, PVD, HFrEF, pulmonary htn, morbid obesity, OSA, GERD, hx of TBI, dyslipidemia who came in after girlfriend found him to be unresponsive.  He was found to be severely hypoxemic in circulatory shock with left lung infiltrate on CXR. Labwork consistent with sepsis. Due to decline in status, pt was orally intubated at admit; extubated on 10/03/2020. Currently on HFNC at ~50% per RT. Pt awake, verbal but w/ slower mentation and decision making -- this is Baseline s/p accident when he fell off a roof in 2016 per GF and Father.   Assessment / Plan / Recommendation Clinical Impression  Pt appears to present w/ risk for, and suspicion of, pharyngeal phase dysphagia moreson w/ thin liquids -- coughing w/ trials noted this AM when NSG attempted sips of water at bedside. Pt was recently extubated yesterday(10/03/2020) post ~13 days of oral intubation. At this evaluation, pt was given modified liquid consistency w/  purees, ice chips(no solid foods assessed) d/t current overall presentation. No overt neuromuscular deficits noted w/ these trial consistencies and No immediate, overt, clinical s/s of aspiration during po trials. However, pt has a Baseline of Significantly declined Pulmonary status which impacts his Stamina and respiratory endurance during ANY exertion/task including even talking, po's. Pt's RR is increased at Baseline in the mid20s increasing to 30s w/ exertion of talking, po trials. W/ strict aspiration precautions and Rest Breaks to address conservation of energy and lessen WOB/SOB, risk for aspiration reduced somewhat. Any such Pulmonary decline/presentation can impact safety w/ oral intake d/t the mistiming of the Apnea moment during swallowing. During po trials, pt consumed all consistencies w/ no immediate, overt coughing, decline in vocal quality. Respiratory rate and effort increased briefly during/post the task but w/ Rest Break, pt was able to calm the RR to his Baseline of the 20s again. Oral phase appeared [facility] w/ timely bolus management and control of bolus propulsion for A-P transfer for swallowing. Oral clearing achieved w/ all trial consistencies. Some Mouth Breathing occurred when pt was taxed and exertion increased. OM Exam appeared [facility] w/ no unilateral weakness noted. Speech Clear. Pt required total assistance w/ po's.  Recommend a Dysphagia 1 diet w/ Nectar liquids for conservation of energy and reduced risk for aspiration - by TSP, small Cup sip. Recommend aspiration precautions, Pills Crushed in Puree for safer, easier swallowing. Much Education w/  pt/NSG on interaction of swallowing and breathing; impact of declined Pulmonary status on swallowing and overall oral intake of some foods in general d/t their consistency/demand. Discussed conservation of energy thorugh Pills in Puree; food consistencies and easy to eat options; must follow aspiration precautions to include frequent Rest Breaks to  calm breathing rate during any oral intake. MD and NSG updated. Wife/pt agreed. Dietician present to provide support of supplements. ST services will f/u w/ toleration of diet; education as needed.  Pt may have Single ice chips in between meals for Pleasure w/ NSG supervision and post oral care. Stop if any increased s/s of aspiration noted.  SLP Visit Diagnosis: Dysphagia, oropharyngeal phase (R13.12) (Pulmonary decline)    Aspiration Risk  Mild-Mod aspiration risk;Risk for inadequate nutrition/hydration    Diet Recommendation  Dysphagia level 1 (puree) w/ Nectar liquids via TSP, small Cup single sip; strict aspiration precautions. Full feeding assistance w/ all meals and Rest Breaks to lessen WOB/SOB - conservation of energy strategies. Reflux precautions.   Medication Administration: Crushed with puree (for safer swallowing)    Other  Recommendations Recommended Consults:  (Dietician following) Oral Care Recommendations: Oral care BID;Oral care before and after PO;Staff/trained caregiver to provide oral care Other Recommendations: Order thickener from pharmacy;Prohibited food (jello, ice cream, thin soups);Remove water pitcher;Have oral suction available   Follow up Recommendations  (TBD)      Frequency and Duration min 3x week  2 weeks       Prognosis Prognosis for Safe Diet Advancement: Fair Barriers to Reach Goals: Cognitive deficits;Time post onset;Severity of deficits Barriers/Prognosis Comment: Pulmonary decline      Swallow Study   General Date of Onset: 09/21/20 HPI: Pt is a 60 yo M with hx of cerebral aneurysm, PVD, HFrEF, pulmonary htn, morbid obesity, OSA, GERD, hx of TBI, dyslipidemia who came in after girlfriend found him to be unresponsive.  He was found to be severely hypoxemic in circulatory shock with left lung infiltrate on CXR. Labwork consistent with sepsis. Due to decline in status, pt was orally intubated at admit; extubated on 10/03/2020. Currently on HFNC at  ~50% per RT. Pt awake, verbal but w/ slower mentation and decision making -- this is Baseline s/p accident when he fell off a roof in 2016 per GF and Father. Type of Study: Bedside Swallow Evaluation Previous Swallow Assessment: none Diet Prior to this Study: NPO Temperature Spikes Noted:  (elevated; WBC elevated) Respiratory Status: Nasal cannula (HFNC 50%) History of Recent Intubation: Yes Length of Intubations (days): 13 days Date extubated: 10/03/20 Behavior/Cognition: Alert;Cooperative;Pleasant mood;Distractible;Requires cueing Oral Cavity Assessment: Excessive secretions (saliva sticky) Oral Care Completed by SLP: Yes Oral Cavity - Dentition: Adequate natural dentition Vision:  (n/a) Self-Feeding Abilities: Total assist Patient Positioning: Upright in bed (needed full positioning) Baseline Vocal Quality: Low vocal intensity (mumbled slightly) Volitional Cough:  (Fair) Volitional Swallow: Unable to elicit    Oral/Motor/Sensory Function Overall Oral Motor/Sensory Function: Within functional limits (grossly)   Ice Chips Ice chips: Within functional limits Presentation: Spoon (fed; 8 trials)   Thin Liquid Thin Liquid: Not tested    Nectar Thick Nectar Thick Liquid: Within functional limits Presentation: Spoon;Cup (fed; 10 via spoon; 5 via cup)   Honey Thick Honey Thick Liquid: Not tested   Puree Puree: Within functional limits Presentation: Spoon (fed; 10 trials)   Solid     Solid: Not tested       Jerilynn Som, MS, CCC-SLP Speech Language Pathologist Rehab Services [phone] Simren Popson 10/04/2020,2:54 PM

## 2020-10-04 NOTE — Progress Notes (Signed)
OT Cancellation Note  Patient Details Name: Eddie Chapman MRN: [id number] DOB: [birth year]   Cancelled Treatment:    Reason Eval/Treat Not Completed: Medical issues which prohibited therapy. OT order received and chart reviewed. Pt with temp of 100.9 , elevated respiratory rate, and hypertensive at this time with red MEWs of 5. OT to hold until pt able to actively participate with OT intervention.   Jackquline Denmark, MS, OTR/L , CBIS ascom [phone]  10/04/20, 1:57 PM   10/04/2020, 1:55 PM

## 2020-10-04 NOTE — Progress Notes (Signed)
Daily Progress Note   Patient Name: Eddie Chapman       Date: 10/04/2020 DOB: Oct 26, 1960  Age: 60 y.o. MRN#: [id number] Attending Physician: Flora Lipps, MD Primary Care Physician: Adin Hector, MD Admit Date: 09/21/2020  Reason for Consultation/Follow-up: Establishing goals of care  Subjective: Patient is resting in bed on HFNC. He is very soft spoken. Some of his statements demonstrate confusion. He denies complaint at this time. No family at bedside. Will re-vist tomorrow for attempt at [facility] with patient.   Length of Stay: 13  Current Medications: Scheduled Meds:  . chlorhexidine gluconate (MEDLINE KIT)  15 mL Mouth Rinse BID  . Chlorhexidine Gluconate Cloth  6 each Topical Daily  . ciprofloxacin  1 drop Both Eyes Q4H  . docusate  200 mg Per Tube BID  . enoxaparin (LOVENOX) injection  0.5 mg/kg Subcutaneous Q24H  . feeding supplement (NEPRO CARB STEADY)  237 mL Oral TID BM  . furosemide  40 mg Intravenous Daily  . ipratropium-albuterol  3 mL Nebulization Q4H  . mouth rinse  15 mL Mouth Rinse 10 times per day  . [START ON 10/05/2020] multivitamin with minerals  1 tablet Oral Daily  . pantoprazole sodium  40 mg Per Tube Daily  . polyethylene glycol  17 g Per Tube Daily  . sodium chloride flush  10-40 mL Intracatheter Q12H    Continuous Infusions: . sodium chloride 250 mL (10/04/20 1030)    PRN Meds: acetaminophen, docusate sodium, fentaNYL (SUBLIMAZE) injection, hydrALAZINE, labetalol, LORazepam, polyethylene glycol, sodium chloride flush  Physical Exam Pulmonary:     Effort: Pulmonary effort is normal.  Neurological:     Mental Status: He is alert.             Vital Signs: BP (!) 160/101 Comment: BP goals <180/<105... no PRNs needed at this time  Pulse (!) 108    Temp (!) 100.94 F (38.3 C)   Resp (!) 36   Ht '5\' 10"'  (1.778 m)   Wt (!) 138.2 kg   SpO2 96%   BMI 43.72 kg/m  SpO2: SpO2: 96 % O2 Device: O2 Device: High Flow Nasal Cannula O2 Flow Rate: O2 Flow Rate (L/min): 50 L/min  Intake/output summary:   Intake/Output Summary (Last 24 hours) at 10/04/2020 1511 Last data filed at 10/04/2020 1200 Gross  per 24 hour  Intake 331.66 ml  Output 3675 ml  Net -3343.34 ml   LBM: Last BM Date: 10/03/20 Baseline Weight: Weight: (!) 147.4 kg Most recent weight: Weight: (!) 138.2 kg       Palliative Assessment/Data:      Patient Active Problem List   Diagnosis Date Noted  . Septic shock ([place]) 09/21/2020  . Occlusion and stenosis of vertebral artery 08/19/2016  . Aneurysm, cerebral, nonruptured 08/19/2016  . Essential hypertension 08/19/2016  . Hyperlipidemia 08/19/2016    Palliative Care Assessment & Plan    Recommendations/Plan:  Patient is confused. No family at bedside. Will revisit tomorrow to attempt [place] with patient.     Code Status:    Code Status Orders  (From admission, onward)         Start     Ordered   09/21/20 1523  Full code  Continuous        09/21/20 1524        Code Status History    This patient has a current code status but no historical code status.   Advance Care Planning Activity       Prognosis:   Unable to determine   Thank you for allowing the Palliative Medicine Team to assist in the care of this patient.   Total Time 15 min Prolonged Time Billed  no      Greater than 50%  of this time was spent counseling and coordinating care related to the above assessment and plan.  Asencion Gowda, NP  Please contact Palliative Medicine Team phone at [phone] for questions and concerns.

## 2020-10-04 NOTE — Consult Note (Signed)
Pharmacy Antibiotic Note  Eddie Chapman is a 60 y.o. male admitted on 09/21/2020 after found unresponsive. Was found to be severely hypoxemic in circulatory shock with left lung infiltrate on CXR. Patient completed 7-day course of Zosyn on 09/28/20. Patient remains febrile. Pharmacy has been consulted for Vancomycin and Meropenem dosing for PNA.  Plan:  Start meropenem 1 gram Q8H  Give Vancomycin loading dose 2500 mg x 1  Initiate vancomycin maintenance regimen of 1500 mg Q12H Goal AUC 400-550 Expected AUC: 490  SCr used: 0.8  Levels at steady state if warranted  Continue to monitor renal function daily and adjust regimens as needed   Height: 5\' 10"  (177.8 cm) Weight: (!) 138.2 kg (304 lb 10.8 oz) IBW/kg (Calculated) : 73  Temp (24hrs), Avg:100.4 F (38 C), Min:99.86 F (37.7 C), Max:101.12 F (38.4 C)  Recent Labs  Lab 09/30/20 0400 10/01/20 0354 10/02/20 0420 10/03/20 0500 10/04/20 0500  WBC 6.1 7.3 7.4 7.7 16.2*  CREATININE 0.79 0.76 0.49* 0.48* 0.74    Estimated Creatinine Clearance: 137.6 mL/min (by C-G formula based on SCr of 0.74 mg/dL).    Allergies  Allergen Reactions  . Divalproex Sodium Other (See Comments)    Elevated liver enzymes  . Valproic Acid     unknown    Antimicrobials this admission: Cefepime >> x1 12/23 Vancomycin >> 2500 mg LD x 1 12/23 Zosyn >>12/24 -12/30  Dose adjustments this admission: n/a  Microbiology results: 12/28 BCx: MRSE in 1/4 bottles 12/24 Sputum: rare normal respiratory flora  12/23 MRSA PCR: neg  Thank you for allowing pharmacy to be a part of this patient's care.  1/24, PharmD, BCPS Clinical Pharmacist  10/04/2020 5:46 PM

## 2020-10-04 NOTE — Progress Notes (Signed)
CH visited briefly w/pt.'s fr. 60yo Fr. Fred in ICU hallway while he awaited volunteer to push wheelchair out to his vehicle.  Fr. lives near [place] and appears to be active and mobile; hospital staff at [facility] prevented him from walking w/can up to ICU and insisted on wheelchair.  Fr. requested campus map to help him find the correct entrance on his next visit.  CH visited pt. after Fr. had left; pt. now extubated, sitting up in bed wearing HFNC.  Pt. says he feels better w/breathing tube removed; requested ice chips and some liquids to drink (request referred to RN).  Pt. seems slightly confused and disoriented --> thought he was currently in [place] and that his father had driven across country to see him today.  CH remains available as needed.

## 2020-10-04 NOTE — Evaluation (Signed)
Physical Therapy Evaluation Patient Details Name: Eddie Chapman MRN: [id number] DOB: 07-21-1961 Today's Date: 10/04/2020   History of Present Illness  presented to ER after found unresponsive, severely hypoxemic; admitted for management of acute hypoxic respiratory failure, septic shock due to consolidated L lung PNA.  Intubated 12/23-1/4; weaned to [facility] at 63-65% FiO2 at this time.  Clinical Impression  Patient awake upon arrival to room; acknowledges therapist entrance and agreeable to participation with session.  Oriented to self, location and date; follows simple commands throughout session.  Moderately hyperverbal at times (with low phonation, limited articulation).  Globally weak and deconditioned throughout all extremities (at least 2+ to 3-/5 throughout), requiring physical assist for movement against gravity in all planes.  Currently requiring total assist +2 for bed mobility; mod/max assist for unsupported sitting balance edge of bed.  Lists L posterior/lateral direction when support reduced; absent attempts at self-correction. Unsafe/unable to attempt OOB standing at this time.  Did transition to chair position in bed at conclusion of session, bilat UEs elevated for edema control.  Tolerating position well. Of note, sats >91% on HHFNC throughout session. Would benefit from skilled PT to address above deficits and promote optimal return to PLOF.; recommend transition to STR upon discharge from acute hospitalization.      Follow Up Recommendations SNF    Equipment Recommendations       Recommendations for Other Services       Precautions / Restrictions Precautions Precautions: Fall Restrictions Weight Bearing Restrictions: No      Mobility  Bed Mobility Overal bed mobility: Needs Assistance Bed Mobility: Supine to Sit;Sit to Supine     Supine to sit: Total assist;+2 for physical assistance Sit to supine: Total assist;+2 for physical assistance   General bed mobility  comments: globally weak and deconditioned; does attempt to assist with truncal elevation, but unable to raise from bed surface without extensive physical assist    Transfers                 General transfer comment: unsafe/unable  Ambulation/Gait             General Gait Details: unsafe/unable  Stairs            Wheelchair Mobility    Modified Rankin (Stroke Patients Only)       Balance Overall balance assessment: Needs assistance Sitting-balance support: No upper extremity supported;Feet supported Sitting balance-Leahy Scale: Poor Sitting balance - Comments: very forward flexed posture, L posterior/lateral lean/LOB with absent attempts at self-correction Postural control: Left lateral lean;Posterior lean     Standing balance comment: unsafe/unable                             Pertinent Vitals/Pain Pain Assessment: No/denies pain    Home Living Family/patient expects to be discharged to:: Private residence Living Arrangements: Spouse/significant other Available Help at Discharge: Family Type of Home: House       Home Layout: One level Home Equipment: Gilmer Mor - single point      Prior Function Level of Independence: Independent with assistive device(s)         Comments: Mod indep with SPC for household distances; no home O2.     Hand Dominance   Dominant Hand: Right    Extremity/Trunk Assessment   Upper Extremity Assessment Upper Extremity Assessment: Generalized weakness (R UE grossly 3-/5, L UE 2+ to 3-/5 throughout.  L > R UE with generalized edema (?r/t BP  cuff?-elevated on pillows end of session for edema control))    Lower Extremity Assessment Lower Extremity Assessment: Generalized weakness (grossly 2+ to 3-/5 throughout; increased time/effort for isolated movement.  Good ROM, but does require physical assist to complete movement all joints, all planes)       Communication   Communication:  (generally hypophonic,  limited articulation, hyperverbal at times)  Cognition Arousal/Alertness: Awake/alert Behavior During Therapy: Flat affect Overall Cognitive Status: No family/caregiver present to determine baseline cognitive functioning                                 General Comments: oriented to self, location as hospital and date; follows simple commands; hyperverbal at times, but easily redirectable      General Comments      Exercises Other Exercises Other Exercises: Bilat LE supine therex, 1x10, act assist ROM: ankle pumps, hip abduct/adduct and heel slides (manual resisted extension). Other Exercises: Assisted with drinking per patient request (nectar liquids)-requires mod/max assist with hand-over-hand to grasp and maintain hold on cup.  Unable to bring cup fully to mouth due to UE weakness.   Assessment/Plan    PT Assessment Patient needs continued PT services  PT Problem List Decreased strength;Decreased range of motion;Decreased activity tolerance;Decreased balance;Decreased mobility;Decreased cognition;Decreased knowledge of use of DME;Decreased safety awareness;Decreased knowledge of precautions;Cardiopulmonary status limiting activity;Obesity       PT Treatment Interventions DME instruction;Gait training;Functional mobility training;Stair training;Therapeutic activities;Therapeutic exercise;Balance training;Patient/family education;Cognitive remediation    PT Goals (Current goals can be found in the Care Plan section)  Acute Rehab PT Goals Patient Stated Goal: to get something to drink PT Goal Formulation: With patient Time For Goal Achievement: 10/18/20 Potential to Achieve Goals: Good    Frequency Min 2X/week   Barriers to discharge Decreased caregiver support      Co-evaluation               AM-PAC PT "6 Clicks" Mobility  Outcome Measure Help needed turning from your back to your side while in a flat bed without using bedrails?: Total Help needed  moving from lying on your back to sitting on the side of a flat bed without using bedrails?: Total Help needed moving to and from a bed to a chair (including a wheelchair)?: Total Help needed standing up from a chair using your arms (e.g., wheelchair or bedside chair)?: Total Help needed to walk in hospital room?: Total Help needed climbing 3-5 steps with a railing? : Total 6 Click Score: 6    End of Session   Activity Tolerance: Patient tolerated treatment well Patient left: in bed;with call bell/phone within reach;with bed alarm set (chair position in bed) Nurse Communication: Mobility status PT Visit Diagnosis: Muscle weakness (generalized) (M62.81);Difficulty in walking, not elsewhere classified (R26.2)    Time: 1610-9604 PT Time Calculation (min) (ACUTE ONLY): 32 min   Charges:   PT Evaluation $PT Eval Moderate Complexity: 1 Mod PT Treatments $Therapeutic Exercise: 8-22 mins        Patrizia Paule H. Manson Passey, PT, DPT, NCS 10/04/20, 2:00 PM [phone]

## 2020-10-04 NOTE — Progress Notes (Signed)
CRITICAL CARE NOTE  Patient description:  60 yo M with hx of cerebral aneurysm, PVD, HFrEF, pulmonary htn, morbid obesity, OSA, GERD, hx of TBI, dyslipidemia who came in after girlfriend found him to be unresponsive.  He was found to be severely hypoxemic in circulatory shock with left lung infiltrate on CXR. Labwork consistent with sepsis.   Events:  09/22/20- patient is off vasopressors.  He is weaning on FIO2.  Family at bedside (girlfriend).  Patient is weaned to 40% FIO2. 09/23/20- patient failed SBT today. 09/25/20-patient with continued tenacious secretions and patient also with altered mentation.  Started Precedex in hopes to be able to wean Versed.  As patient extremely altered and difficult to arouse.  CT head and CT chest obtained 09/26/20-Pateint following commands intermittently but agitated.  SBT attempted this morning and failed. Patient was taking TV of 200 on 10/5. RR increased to 40. Placed back on rate and sedaiton increased.  09/27/2020-patient attempted another spontaneous breathing trial.  Patient becomes extremely agitated.  Patient takes shallow breaths and has high respiratory rate. 12/30- remains heavily sedated 09/29/2020: Poorly responsive when sedation is lightened, does not tolerate SBT 10/01/2019: Not tolerating SBT, will need goals of care addressed 10/02/2019: More responsive, still unable to tolerate SBT well 10/03/19- failed SBT today, palliative care evaluation in process 10/04/19- patient passed SBT and was extubated to BIPAP. I spoke with patients father and met with GF at bedside for a second update.  1/5 remains extubated     CC  follow up respiratory failure  HPI Patient remains critically ill Prognosis is guarded High risk intubation   BP (!) 183/98   Pulse 97   Temp 100.22 F (37.9 C)   Resp (!) 22   Ht '5\' 10"'  (1.778 m)   Wt (!) 138.2 kg   SpO2 95%   BMI 43.72 kg/m    I/O last 3 completed shifts: In: 2866 [I.V.:2839.2; IV  Piggyback:26.8] Out: 4500 [Urine:4500] Total I/O In: 0  Out: 325 [Urine:325]  SpO2: 95 % O2 Flow Rate (L/min): 50 L/min FiO2 (%): 62 %  Estimated body mass index is 43.72 kg/m as calculated from the following:   Height as of this encounter: '5\' 10"'  (1.778 m).   Weight as of this encounter: 138.2 kg.   Review of Systems: Lethargic but arousable Other:  All other systems negative    PHYSICAL EXAMINATION:  GENERAL:critically ill appearing, obese HEAD: Normocephalic, atraumatic.  EYES: Pupils equal, round, reactive to light.  No scleral icterus.  MOUTH: Moist mucosal membrane. NECK: Supple.  PULMONARY: +rhonchi, +wheezing CARDIOVASCULAR: S1 and S2. Regular rate and rhythm. No murmurs, rubs, or gallops.  GASTROINTESTINAL: Soft, nontender, -distended.  Positive bowel sounds.   MUSCULOSKELETAL:+edema.  NEUROLOGIC: lethargic  SKIN:intact,warm,dry  MEDICATIONS: I have reviewed all medications and confirmed regimen as documented   CULTURE RESULTS   Recent Results (from the past 240 hour(s))  CULTURE, BLOOD (ROUTINE X 2) w Reflex to ID Panel     Status: Abnormal   Collection Time: 09/26/20 10:41 PM   Specimen: BLOOD  Result Value Ref Range Status   Specimen Description   Final    BLOOD BLOOD RIGHT HAND Performed at [facility], [address], [place], [place] [address]    Special Requests   Final    BOTTLES DRAWN AEROBIC AND ANAEROBIC Blood Culture results may not be optimal due to an excessive volume of blood received in culture bottles Performed at [facility], [address], [place], [place]  [address]    Culture  Setup Time   Final    GRAM POSITIVE COCCI ANAEROBIC BOTTLE ONLY CRITICAL RESULT CALLED TO, READ BACK BY AND VERIFIED WITH: NATHAN BELUE AT 5945 09/28/20 SDR    Culture (A)  Final    STAPHYLOCOCCUS EPIDERMIDIS THE SIGNIFICANCE OF ISOLATING THIS ORGANISM FROM A SINGLE SET OF BLOOD CULTURES WHEN MULTIPLE SETS ARE DRAWN IS UNCERTAIN.  PLEASE NOTIFY THE MICROBIOLOGY DEPARTMENT WITHIN ONE WEEK IF SPECIATION AND SENSITIVITIES ARE REQUIRED. Performed at [facility], [place] [address], [place], [place] [address]    Report Status 09/30/2020 FINAL  Final  Blood Culture ID Panel (Reflexed)     Status: Abnormal   Collection Time: 09/26/20 10:41 PM  Result Value Ref Range Status   Enterococcus faecalis NOT DETECTED NOT DETECTED Final   Enterococcus Faecium NOT DETECTED NOT DETECTED Final   Listeria monocytogenes NOT DETECTED NOT DETECTED Final   Staphylococcus species DETECTED (A) NOT DETECTED Final    Comment: CRITICAL RESULT CALLED TO, READ BACK BY AND VERIFIED WITH:  NATHAN BELUE AT 2446 09/28/20 SDR    Staphylococcus aureus (BCID) NOT DETECTED NOT DETECTED Final   Staphylococcus epidermidis DETECTED (A) NOT DETECTED Final    Comment: Methicillin (oxacillin) resistant coagulase negative staphylococcus. Possible blood culture contaminant (unless isolated from more than one blood culture draw or clinical case suggests pathogenicity). No antibiotic treatment is indicated for blood  culture contaminants. CRITICAL RESULT CALLED TO, READ BACK BY AND VERIFIED WITH:  NATHAN BELUE AT 2863 09/28/20 SDR    Staphylococcus lugdunensis NOT DETECTED NOT DETECTED Final   Streptococcus species NOT DETECTED NOT DETECTED Final   Streptococcus agalactiae NOT DETECTED NOT DETECTED Final   Streptococcus pneumoniae NOT DETECTED NOT DETECTED Final   Streptococcus pyogenes NOT DETECTED NOT DETECTED Final   A.calcoaceticus-baumannii NOT DETECTED NOT DETECTED Final   Bacteroides fragilis NOT DETECTED NOT DETECTED Final   Enterobacterales NOT DETECTED NOT DETECTED Final   Enterobacter cloacae complex NOT DETECTED NOT DETECTED Final   Escherichia coli NOT DETECTED NOT DETECTED Final   Klebsiella aerogenes NOT DETECTED NOT DETECTED Final   Klebsiella oxytoca NOT DETECTED NOT DETECTED Final   Klebsiella pneumoniae NOT DETECTED NOT DETECTED Final    Proteus species NOT DETECTED NOT DETECTED Final   Salmonella species NOT DETECTED NOT DETECTED Final   Serratia marcescens NOT DETECTED NOT DETECTED Final   Haemophilus influenzae NOT DETECTED NOT DETECTED Final   Neisseria meningitidis NOT DETECTED NOT DETECTED Final   Pseudomonas aeruginosa NOT DETECTED NOT DETECTED Final   Stenotrophomonas maltophilia NOT DETECTED NOT DETECTED Final   Candida albicans NOT DETECTED NOT DETECTED Final   Candida auris NOT DETECTED NOT DETECTED Final   Candida glabrata NOT DETECTED NOT DETECTED Final   Candida krusei NOT DETECTED NOT DETECTED Final   Candida parapsilosis NOT DETECTED NOT DETECTED Final   Candida tropicalis NOT DETECTED NOT DETECTED Final   Cryptococcus neoformans/gattii NOT DETECTED NOT DETECTED Final   Methicillin resistance mecA/C DETECTED (A) NOT DETECTED Final    Comment: CRITICAL RESULT CALLED TO, READ BACK BY AND VERIFIED WITHLloyd Huger AT 8177 09/28/20 SDR Performed at [facility], [address] [place], [place] [address]   CULTURE, BLOOD (ROUTINE X 2) w Reflex to ID Panel     Status: None   Collection Time: 09/26/20 11:57 PM   Specimen: BLOOD  Result Value Ref Range Status   Specimen Description BLOOD RIGHT RADIAL  Final   Special Requests   Final  BOTTLES DRAWN AEROBIC AND ANAEROBIC Blood Culture adequate volume   Culture   Final    NO GROWTH 5 DAYS Performed at [facility], [address] [place], [place] [address]    Report Status 10/02/2020 FINAL  Final          IMAGING    No results found.   Nutrition Status: Nutrition Problem: Inadequate oral intake Etiology: inability to eat Signs/Symptoms: NPO status Interventions: Prostat,Tube feeding,MVI     Indwelling Urinary Catheter continued, requirement due to   Reason to continue Indwelling Urinary Catheter strict Intake/Output monitoring for hemodynamic instability   Central Line/ continued, requirement due to  Reason to  continue [place] of central venous pressure or other hemodynamic parameters and poor IV access      ASSESSMENT AND PLAN SYNOPSIS Acute hypoxemic respiratory failure CT chest -impressive Left lung pneumonia  Acute metabolic encephalopathy related to critical illness, sedation Acute kidney injury due to ATN Class III obesity GERD Depression  Severe ACUTE Hypoxic and Hypercapnic Respiratory Failure Wean fio2 High risk for intubation  ACUTE DIASTOLIC CARDIAC FAILURE-  -oxygen as needed -Lasix as tolerated   Morbid obesity, possible OSA.   Will certainly impact respiratory mechanics BiPAP as needed   CARDIAC ICU monitoring  ID  IV abx completed  -follow up cultures  GI GI PROPHYLAXIS as indicated  NUTRITIONAL STATUS Nutrition Status: Nutrition Problem: Inadequate oral intake Etiology: inability to eat Signs/Symptoms: NPO status Interventions: Prostat,Tube feeding,MVI   DIET-->NPO Constipation protocol as indicated  ENDO - will use ICU hypoglycemic\Hyperglycemia protocol if indicated     ELECTROLYTES -follow labs as needed -replace as needed -pharmacy consultation and following   DVT/GI PRX ordered and assessed TRANSFUSIONS AS NEEDED MONITOR FSBS I Assessed the need for Labs I Assessed the need for Foley I Assessed the need for Central Venous Line Family Discussion when available I Assessed the need for Mobilization I made an Assessment of medications to be adjusted accordingly Safety Risk assessment completed   CASE DISCUSSED IN MULTIDISCIPLINARY ROUNDS WITH ICU TEAM  Critical Care Time devoted to patient care services described in this note is 35 minutes.   Overall, patient is critically ill, prognosis is guarded.  Patient with Multiorgan failure and at high risk for cardiac arrest and death.    Corrin Parker, M.D.  [facility]  Medical Director [place] Director [facility]  Cardio-Pulmonary Department

## 2020-10-04 NOTE — Progress Notes (Signed)
Nutrition Follow-up  DOCUMENTATION CODES:   Morbid obesity  INTERVENTION:   RD will monitor for diet advancement and order supplements pending SLP recommendations.   NUTRITION DIAGNOSIS:   Inadequate oral intake related to inability to eat as evidenced by NPO status. Ongoing.  GOAL:   Patient will meet greater than or equal to 90% of their needs -previously met with TF regimen.  MONITOR:   Diet advancement,Labs,Weight trends,Skin,I & O's  ASSESSMENT:   60 year old male with PMHx of cerebral aneurysm, TBI, HLD, HTN, depression, GERD, HFrEF, OSA admitted with severe hypoxemia, circulatory shock, left lung consolidated PNA.   Pt extubated 1/5. SLP evaluation pending. RD will monitor for diet advancement and add supplements pending SLP recommendations. Refeed labs improving. Palliative care following for GOC.   Per chart, pt down ~16lbs(5%) since admit.   Medications reviewed and include: ciprofloxacin, colace, lovenox, lasix, protonix, miralax, carafate  Labs reviewed: Na 149(H), K 3.3(L), BUN 35(H), P 3.1 wnl, Mg 2.1 wnl Wbc- 16.2(H) cbgs- 135, 123 x 24 hrs  Diet Order:   Diet Order    None     EDUCATION NEEDS:   No education needs have been identified at this time  Skin:  Skin Assessment: Reviewed RN Assessment  Last BM:  1/4- type 6  Height:   Ht Readings from Last 1 Encounters:  09/21/20 _0  (1.778 m)   Weight:   Wt Readings from Last 1 Encounters:  10/04/20 (!) 138.2 kg   Ideal Body Weight:  75.5 kg  BMI:  Body mass index is 43.72 kg/m.  Estimated Nutritional Needs:   Kcal:  2700-3000kcal/day  Protein:  >135g/day  Fluid:  2-2.3 L/day  Koleen Distance MS, RD, LDN Please refer to [facility] for RD and/or RD on-call/weekend/after hours pager

## 2020-10-05 DIAGNOSIS — R6521 Severe sepsis with septic shock: Secondary | ICD-10-CM | POA: Diagnosis not present

## 2020-10-05 DIAGNOSIS — J189 Pneumonia, unspecified organism: Secondary | ICD-10-CM | POA: Diagnosis not present

## 2020-10-05 DIAGNOSIS — Z7189 Other specified counseling: Secondary | ICD-10-CM | POA: Diagnosis not present

## 2020-10-05 DIAGNOSIS — Z515 Encounter for palliative care: Secondary | ICD-10-CM | POA: Diagnosis not present

## 2020-10-05 DIAGNOSIS — A419 Sepsis, unspecified organism: Secondary | ICD-10-CM | POA: Diagnosis not present

## 2020-10-05 LAB — CBC WITH DIFFERENTIAL/PLATELET
Abs Immature Granulocytes: 0.05 10*3/uL (ref 0.00–0.07)
Basophils Absolute: 0 10*3/uL (ref 0.0–0.1)
Basophils Relative: 0 %
Eosinophils Absolute: 0 10*3/uL (ref 0.0–0.5)
Eosinophils Relative: 0 %
HCT: 52.2 % — ABNORMAL HIGH (ref 39.0–52.0)
Hemoglobin: 16 g/dL (ref 13.0–17.0)
Immature Granulocytes: 1 %
Lymphocytes Relative: 7 %
Lymphs Abs: 0.8 10*3/uL (ref 0.7–4.0)
MCH: 29.6 pg (ref 26.0–34.0)
MCHC: 30.7 g/dL (ref 30.0–36.0)
MCV: 96.7 fL (ref 80.0–100.0)
Monocytes Absolute: 1.5 10*3/uL — ABNORMAL HIGH (ref 0.1–1.0)
Monocytes Relative: 14 %
Neutro Abs: 8.6 10*3/uL — ABNORMAL HIGH (ref 1.7–7.7)
Neutrophils Relative %: 78 %
Platelets: 242 10*3/uL (ref 150–400)
RBC: 5.4 MIL/uL (ref 4.22–5.81)
RDW: 13.2 % (ref 11.5–15.5)
WBC: 11 10*3/uL — ABNORMAL HIGH (ref 4.0–10.5)
nRBC: 0 % (ref 0.0–0.2)

## 2020-10-05 LAB — RENAL FUNCTION PANEL
Albumin: 2.9 g/dL — ABNORMAL LOW (ref 3.5–5.0)
Anion gap: 10 (ref 5–15)
BUN: 31 mg/dL — ABNORMAL HIGH (ref 6–20)
CO2: 39 mmol/L — ABNORMAL HIGH (ref 22–32)
Calcium: 9.2 mg/dL (ref 8.9–10.3)
Chloride: 101 mmol/L (ref 98–111)
Creatinine, Ser: 0.74 mg/dL (ref 0.61–1.24)
GFR, Estimated: >60 mL/min (ref >60)
Glucose, Bld: 138 mg/dL — ABNORMAL HIGH (ref 70–99)
Phosphorus: 3 mg/dL (ref 2.5–4.6)
Potassium: 2.7 mmol/L — CL (ref 3.5–5.1)
Sodium: 150 mmol/L — ABNORMAL HIGH (ref 135–145)

## 2020-10-05 LAB — GLUCOSE, CAPILLARY
Glucose-Capillary: 129 mg/dL — ABNORMAL HIGH (ref 70–99)
Glucose-Capillary: 234 mg/dL — ABNORMAL HIGH (ref 70–99)
Glucose-Capillary: 181 mg/dL — ABNORMAL HIGH (ref 70–99)
Glucose-Capillary: 192 mg/dL — ABNORMAL HIGH (ref 70–99)
Glucose-Capillary: 150 mg/dL — ABNORMAL HIGH (ref 70–99)
Glucose-Capillary: 138 mg/dL — ABNORMAL HIGH (ref 70–99)
Glucose-Capillary: 136 mg/dL — ABNORMAL HIGH (ref 70–99)

## 2020-10-05 LAB — MAGNESIUM: Magnesium: 2 mg/dL (ref 1.7–2.4)

## 2020-10-05 MED ORDER — SODIUM CHLORIDE 0.9% FLUSH: 10.0000 mL | INTRAVENOUS | Status: DC | PRN

## 2020-10-05 MED ORDER — POTASSIUM CHLORIDE 20 MEQ PO PACK
40.0000 meq | PACK | Freq: Once | ORAL | Status: AC
  Administered 2020-10-05: 40 meq via ORAL
  Filled 2020-10-05: qty 2

## 2020-10-05 MED ORDER — INSULIN ASPART 100 UNIT/ML ~~LOC~~ SOLN
0.0000 [IU] | Freq: Three times a day (TID) | SUBCUTANEOUS | Status: DC
  Administered 2020-10-05: 3 [IU] via SUBCUTANEOUS
  Administered 2020-10-06: 5 [IU] via SUBCUTANEOUS
  Administered 2020-10-06: 2 [IU] via SUBCUTANEOUS
  Administered 2020-10-07: 3 [IU] via SUBCUTANEOUS
  Administered 2020-10-07 – 2020-10-09 (×3): 2 [IU] via SUBCUTANEOUS
  Administered 2020-10-10: 3 [IU] via SUBCUTANEOUS
  Filled 2020-10-05 (×7): qty 1

## 2020-10-05 MED ORDER — INSULIN ASPART 100 UNIT/ML ~~LOC~~ SOLN: 0.0000 [IU] | Freq: Every day | SUBCUTANEOUS | Status: DC

## 2020-10-05 MED ORDER — POTASSIUM CHLORIDE 10 MEQ/50ML IV SOLN
10.0000 meq | INTRAVENOUS | Status: DC
  Administered 2020-10-05: 10 meq via INTRAVENOUS
  Filled 2020-10-05 (×6): qty 50

## 2020-10-05 NOTE — Progress Notes (Signed)
OT Cancellation Note  Patient Details Name: Eddie Chapman MRN: [id number] DOB: 26-Oct-1960   Cancelled Treatment:    Reason Eval/Treat Not Completed: Medical issues which prohibited therapy. Consult received, chart reviewed. Pt noted with critical low K+, sodium elevated, hypertensive requiring medication, and tachycardic. Will hold OT evaluation at this time and re-attempt at later date/time as medically appropriate for exertional activity.   Richrd Prime, MPH, MS, OTR/L ascom [phone] 10/05/20, 11:06 AM

## 2020-10-05 NOTE — Progress Notes (Signed)
                                                                                                                                                                                                         Daily Progress Note   Patient Name: Eddie Chapman       Date: 10/05/2020 DOB: 05/21/1961  Age: 59 y.o. MRN#: [id number] Attending Physician: Kasa, Kurian, MD Primary Care Physician: Klein, Bert J III, MD Admit Date: 09/21/2020  Reason for Consultation/Follow-up: Establishing goals of care  Subjective: Patient is resting in bed with girlfriend at bedside. He is oriented today and answers questions appropriately; his voice is very soft. He restates he would like his girlfriend to be his HPOA. He states he really does not want to be re-intubated, but with discussion of ventilator support vs comfort care, he states he will consider the decision further. He will also consider if he would want CPR if his heart stopped. Encouraged him to speak with family about these decisions.   Length of Stay: 14  Current Medications: Scheduled Meds:  . chlorhexidine gluconate (MEDLINE KIT)  15 mL Mouth Rinse BID  . Chlorhexidine Gluconate Cloth  6 each Topical Daily  . ciprofloxacin  1 drop Both Eyes Q4H  . docusate  200 mg Per Tube BID  . enoxaparin (LOVENOX) injection  0.5 mg/kg Subcutaneous Q24H  . feeding supplement (NEPRO CARB STEADY)  237 mL Oral TID BM  . ipratropium-albuterol  3 mL Nebulization Q4H  . mouth rinse  15 mL Mouth Rinse 10 times per day  . multivitamin with minerals  1 tablet Oral Daily  . pantoprazole sodium  40 mg Per Tube Daily  . polyethylene glycol  17 g Per Tube Daily  . sodium chloride flush  10-40 mL Intracatheter Q12H    Continuous Infusions: . sodium chloride 250 mL (10/04/20 1030)  . meropenem (MERREM) IV Stopped (10/05/20 0535)  . vancomycin 150 mL/hr at 10/05/20 0800    PRN Meds: acetaminophen, docusate sodium, fentaNYL (SUBLIMAZE) injection, hydrALAZINE, labetalol,  LORazepam, polyethylene glycol, sodium chloride flush  Physical Exam Pulmonary:     Effort: Pulmonary effort is normal.  Neurological:     Mental Status: He is alert.             Vital Signs: BP (!) 143/83   Pulse (!) 126   Temp (!) 100.4 F (38 C)   Resp (!) 35   Ht 5' 10" (1.778 m)   Wt 135.1 kg   SpO2 94%   BMI 42.74 kg/m  SpO2:   SpO2: 94 % O2 Device: O2 Device: High Flow Nasal Cannula O2 Flow Rate: O2 Flow Rate (L/min): 50 L/min  Intake/output summary:   Intake/Output Summary (Last 24 hours) at 10/05/2020 1113 Last data filed at 10/05/2020 1000 Gross per 24 hour  Intake 2846.64 ml  Output 3550 ml  Net -703.36 ml   LBM: Last BM Date: 10/06/19 Baseline Weight: Weight: (!) 147.4 kg Most recent weight: Weight: 135.1 kg       Palliative Assessment/Data:      Patient Active Problem List   Diagnosis Date Noted  . Septic shock (HCC) 09/21/2020  . Occlusion and stenosis of vertebral artery 08/19/2016  . Aneurysm, cerebral, nonruptured 08/19/2016  . Essential hypertension 08/19/2016  . Hyperlipidemia 08/19/2016    Palliative Care Assessment & Plan   Recommendations/Plan:  Recommend palliative outpatient.   Patient thinking, but unsure about re-intubation and code status.   Needs HPOA papers completed as patient wants girlfriend NOT father to be his surrogate decision maker. Chaplain referral has been made. Please follow up that this is completed.   Code Status:    Code Status Orders  (From admission, onward)         Start     Ordered   09/21/20 1523  Full code  Continuous        09/21/20 1524        Code Status History    This patient has a current code status but no historical code status.   Advance Care Planning Activity       Prognosis:   Unable to determine   Thank you for allowing the Palliative Medicine Team to assist in the care of this patient.   Total Time 15 min Prolonged Time Billed  no      Greater than 50%  of this time  was spent counseling and coordinating care related to the above assessment and plan.   , NP  Please contact Palliative Medicine Team phone at [phone] for questions and concerns.      

## 2020-10-05 NOTE — Progress Notes (Signed)
CRITICAL CARE NOTE 60 yo M with hx of cerebral aneurysm, PVD, HFrEF, pulmonary htn, morbid obesity, OSA, GERD, hx of TBI, dyslipidemia who came in after girlfriend found him to be unresponsive. He was found to be severely hypoxemic in circulatory shock with left lung infiltrate on CXR. Labwork consistent with sepsis.   Events:  09/22/20- patient is off vasopressors. He is weaning on FIO2. Family at bedside (girlfriend). Patient is weaned to 40% FIO2. 09/23/20-patient failed SBT today. 09/25/20-patient with continued tenacious secretions and patient also with altered mentation. Started Precedex in hopes to be able to wean Versed. As patient extremely altered and difficult to arouse. CT head and CT chest obtained 09/26/20-Pateint following commands intermittently but agitated.  SBT attempted this morning and failed. Patient was taking TV of 200 on 10/5. RR increased to 40. Placed back on rate and sedaiton increased.  09/27/2020-patient attempted another spontaneous breathing trial. Patient becomes extremely agitated. Patient takes shallow breaths and has high respiratory rate. 12/30-remains heavily sedated 09/29/2020:Poorly responsive when sedation is lightened, does not tolerate SBT 10/01/2019:Not tolerating SBT, will need goals of care addressed 10/02/2019:More responsive, still unable to tolerate SBT well 10/03/19- failed SBT today, palliative care evaluation in process 10/04/19- patient passed SBT and was extubated to BIPAP. I spoke with patients father and met with GF at bedside for a second update. 1/5 remains extubated, high risk for intubation     CC  follow up respiratory failure  SUBJECTIVE Patient remains critically ill Prognosis is guarded On high flow [place]  FiO2 (%):  [60 %-62 %] 60 %   BP (!) 161/111   Pulse (!) 117   Temp 99.32 F (37.4 C)   Resp (!) 30   Ht _0  (1.778 m)   Wt 135.1 kg   SpO2 96%   BMI 42.74 kg/m    I/O last 3 completed  shifts: In: 1626.4 [P.O.:410; NG/GT:237; IV Piggyback:979.4] Out: 4075 [Urine:4075] No intake/output data recorded.  SpO2: 96 % O2 Flow Rate (L/min): 50 L/min FiO2 (%): 60 %  Estimated body mass index is 42.74 kg/m as calculated from the following:   Height as of this encounter: _1  (1.778 m).   Weight as of this encounter: 135.1 kg.  SIGNIFICANT EVENTS   REVIEW OF SYSTEMS  PATIENT IS UNABLE TO PROVIDE COMPLETE REVIEW OF SYSTEMS DUE TO SEVERE CRITICAL ILLNESS , lethargic       PHYSICAL EXAMINATION:  GENERAL:critically ill appearing, +resp distress HEAD: Normocephalic, atraumatic.  EYES: Pupils equal, round, reactive to light.  No scleral icterus.  MOUTH: Moist mucosal membrane. NECK: Supple.  PULMONARY: +rhonchi, +wheezing CARDIOVASCULAR: S1 and S2. Regular rate and rhythm. No murmurs, rubs, or gallops.  GASTROINTESTINAL: Soft, nontender, -distended.  Positive bowel sounds.   MUSCULOSKELETAL: No swelling, clubbing, or edema.  NEUROLOGIC: lethargic SKIN:intact,warm,dry  MEDICATIONS: I have reviewed all medications and confirmed regimen as documented   CULTURE RESULTS   Recent Results (from the past 240 hour(s))  CULTURE, BLOOD (ROUTINE X 2) w Reflex to ID Panel     Status: Abnormal   Collection Time: 09/26/20 10:41 PM   Specimen: BLOOD  Result Value Ref Range Status   Specimen Description   Final    BLOOD BLOOD RIGHT HAND Performed at [facility], [address] [place], [place] [address]    Special Requests   Final    BOTTLES DRAWN AEROBIC AND ANAEROBIC Blood Culture results may not be optimal due to an excessive volume of blood received in culture bottles  Performed at [facility], [address] [place], [place] [address]    Culture  Setup Time   Final    GRAM POSITIVE COCCI ANAEROBIC BOTTLE ONLY CRITICAL RESULT CALLED TO, READ BACK BY AND VERIFIED WITH: NATHAN BELUE AT 3300 09/28/20 SDR    Culture (A)  Final     STAPHYLOCOCCUS EPIDERMIDIS THE SIGNIFICANCE OF ISOLATING THIS ORGANISM FROM A SINGLE SET OF BLOOD CULTURES WHEN MULTIPLE SETS ARE DRAWN IS UNCERTAIN. PLEASE NOTIFY THE MICROBIOLOGY DEPARTMENT WITHIN ONE WEEK IF SPECIATION AND SENSITIVITIES ARE REQUIRED. Performed at [facility], [place] [address] [place], [place] [address]    Report Status 09/30/2020 FINAL  Final  Blood Culture ID Panel (Reflexed)     Status: Abnormal   Collection Time: 09/26/20 10:41 PM  Result Value Ref Range Status   Enterococcus faecalis NOT DETECTED NOT DETECTED Final   Enterococcus Faecium NOT DETECTED NOT DETECTED Final   Listeria monocytogenes NOT DETECTED NOT DETECTED Final   Staphylococcus species DETECTED (A) NOT DETECTED Final    Comment: CRITICAL RESULT CALLED TO, READ BACK BY AND VERIFIED WITH:  NATHAN BELUE AT 3335 09/28/20 SDR    Staphylococcus aureus (BCID) NOT DETECTED NOT DETECTED Final   Staphylococcus epidermidis DETECTED (A) NOT DETECTED Final    Comment: Methicillin (oxacillin) resistant coagulase negative staphylococcus. Possible blood culture contaminant (unless isolated from more than one blood culture draw or clinical case suggests pathogenicity). No antibiotic treatment is indicated for blood  culture contaminants. CRITICAL RESULT CALLED TO, READ BACK BY AND VERIFIED WITH:  NATHAN BELUE AT 4562 09/28/20 SDR    Staphylococcus lugdunensis NOT DETECTED NOT DETECTED Final   Streptococcus species NOT DETECTED NOT DETECTED Final   Streptococcus agalactiae NOT DETECTED NOT DETECTED Final   Streptococcus pneumoniae NOT DETECTED NOT DETECTED Final   Streptococcus pyogenes NOT DETECTED NOT DETECTED Final   A.calcoaceticus-baumannii NOT DETECTED NOT DETECTED Final   Bacteroides fragilis NOT DETECTED NOT DETECTED Final   Enterobacterales NOT DETECTED NOT DETECTED Final   Enterobacter cloacae complex NOT DETECTED NOT DETECTED Final   Escherichia coli NOT DETECTED NOT DETECTED Final   Klebsiella  aerogenes NOT DETECTED NOT DETECTED Final   Klebsiella oxytoca NOT DETECTED NOT DETECTED Final   Klebsiella pneumoniae NOT DETECTED NOT DETECTED Final   Proteus species NOT DETECTED NOT DETECTED Final   Salmonella species NOT DETECTED NOT DETECTED Final   Serratia marcescens NOT DETECTED NOT DETECTED Final   Haemophilus influenzae NOT DETECTED NOT DETECTED Final   Neisseria meningitidis NOT DETECTED NOT DETECTED Final   Pseudomonas aeruginosa NOT DETECTED NOT DETECTED Final   Stenotrophomonas maltophilia NOT DETECTED NOT DETECTED Final   Candida albicans NOT DETECTED NOT DETECTED Final   Candida auris NOT DETECTED NOT DETECTED Final   Candida glabrata NOT DETECTED NOT DETECTED Final   Candida krusei NOT DETECTED NOT DETECTED Final   Candida parapsilosis NOT DETECTED NOT DETECTED Final   Candida tropicalis NOT DETECTED NOT DETECTED Final   Cryptococcus neoformans/gattii NOT DETECTED NOT DETECTED Final   Methicillin resistance mecA/C DETECTED (A) NOT DETECTED Final    Comment: CRITICAL RESULT CALLED TO, READ BACK BY AND VERIFIED WITHLloyd Huger AT 5638 09/28/20 SDR Performed at [facility], [address] [place], [place] [address]   CULTURE, BLOOD (ROUTINE X 2) w Reflex to ID Panel     Status: None   Collection Time: 09/26/20 11:57 PM   Specimen: BLOOD  Result Value Ref Range Status   Specimen Description BLOOD  RIGHT RADIAL  Final   Special Requests   Final    BOTTLES DRAWN AEROBIC AND ANAEROBIC Blood Culture adequate volume   Culture   Final    NO GROWTH 5 DAYS Performed at [facility], [address] [place], [place] [address]    Report Status 10/02/2020 FINAL  Final          IMAGING    No results found.   Nutrition Status: Nutrition Problem: Inadequate oral intake Etiology: inability to eat Signs/Symptoms: NPO status Interventions: Prostat,Tube feeding,MVI     Indwelling Urinary Catheter continued, requirement due to   Reason to  continue Indwelling Urinary Catheter strict Intake/Output monitoring for hemodynamic instability   Central Line/ continued, requirement due to  Reason to continue [place] of central venous pressure or other hemodynamic parameters and poor IV access    Results for orders placed or performed during the hospital encounter of 09/21/20  Resp Panel by RT-PCR (Flu A&B, Covid) Nasopharyngeal Swab     Status: None   Collection Time: 09/21/20  2:12 PM   Specimen: Nasopharyngeal Swab; Nasopharyngeal(NP) swabs in vial transport medium  Result Value Ref Range Status   SARS Coronavirus 2 by RT PCR NEGATIVE NEGATIVE Final    Comment: (NOTE) SARS-CoV-2 target nucleic acids are NOT DETECTED.  The SARS-CoV-2 RNA is generally detectable in upper respiratory specimens during the acute phase of infection. The lowest concentration of SARS-CoV-2 viral copies this assay can detect is 138 copies/mL. A negative result does not preclude SARS-Cov-2 infection and should not be used as the sole basis for treatment or other patient management decisions. A negative result may occur with  improper specimen collection/handling, submission of specimen other than nasopharyngeal swab, presence of viral mutation(s) within the areas targeted by this assay, and inadequate number of viral copies(<138 copies/mL). A negative result must be combined with clinical observations, patient history, and epidemiological information. The expected result is Negative.  Fact Sheet for Patients:  EntrepreneurPulse.com.au  Fact Sheet for Healthcare Providers:  IncredibleEmployment.be  This test is no t yet approved or cleared by the [place] FDA and  has been authorized for detection and/or diagnosis of SARS-CoV-2 by FDA under an Emergency Use Authorization (EUA). This EUA will remain  in effect (meaning this test can be used) for the duration of the COVID-19 declaration under  Section 564(b)(1) of the Act, 21 U.S.C.section 360bbb-3(b)(1), unless the authorization is terminated  or revoked sooner.       Influenza A by PCR NEGATIVE NEGATIVE Final   Influenza B by PCR NEGATIVE NEGATIVE Final    Comment: (NOTE) The Xpert Xpress SARS-CoV-2/FLU/RSV plus assay is intended as an aid in the diagnosis of influenza from Nasopharyngeal swab specimens and should not be used as a sole basis for treatment. Nasal washings and aspirates are unacceptable for Xpert Xpress SARS-CoV-2/FLU/RSV testing.  Fact Sheet for Patients: EntrepreneurPulse.com.au  Fact Sheet for Healthcare Providers: IncredibleEmployment.be  This test is not yet approved or cleared by the [place] FDA and has been authorized for detection and/or diagnosis of SARS-CoV-2 by FDA under an Emergency Use Authorization (EUA). This EUA will remain in effect (meaning this test can be used) for the duration of the COVID-19 declaration under Section 564(b)(1) of the Act, 21 U.S.C. section 360bbb-3(b)(1), unless the authorization is terminated or revoked.  Performed at [facility], [address] [place], [place] [address]   Culture, blood (routine x 2)     Status: None   Collection  Time: 09/21/20  2:45 PM   Specimen: BLOOD  Result Value Ref Range Status   Specimen Description BLOOD BLOOD RIGHT FOREARM  Final   Special Requests   Final    BOTTLES DRAWN AEROBIC AND ANAEROBIC Blood Culture results may not be optimal due to an inadequate volume of blood received in culture bottles   Culture   Final    NO GROWTH 5 DAYS Performed at [facility], [address] [place], [place] [address]    Report Status 09/26/2020 FINAL  Final  Culture, blood (routine x 2)     Status: None   Collection Time: 09/21/20  2:45 PM   Specimen: BLOOD  Result Value Ref Range Status   Specimen Description BLOOD RIGHT ANTECUBITAL  Final   Special Requests   Final     BOTTLES DRAWN AEROBIC AND ANAEROBIC Blood Culture results may not be optimal due to an excessive volume of blood received in culture bottles   Culture   Final    NO GROWTH 5 DAYS Performed at [facility], [address], [place], [place] [address]    Report Status 09/26/2020 FINAL  Final  Culture, respiratory (non-expectorated)     Status: None   Collection Time: 09/21/20  3:14 PM   Specimen: Tracheal Aspirate; Respiratory  Result Value Ref Range Status   Specimen Description   Final    TRACHEAL ASPIRATE Performed at [facility], [address] [place], [place] [address]    Special Requests   Final    NONE Performed at [facility], [address] [place], [place] [address]    Gram Stain   Final    FEW WBC PRESENT,BOTH PMN AND MONONUCLEAR FEW GRAM POSITIVE COCCI IN PAIRS IN CHAINS FEW GRAM NEGATIVE RODS    Culture   Final    FEW Normal respiratory flora-no Staph aureus or Pseudomonas seen Performed at [facility], [place] [address] [place], [place] [address]    Report Status 09/24/2020 FINAL  Final  MRSA PCR Screening     Status: None   Collection Time: 09/21/20  4:15 PM   Specimen: Nasal Mucosa; Nasopharyngeal  Result Value Ref Range Status   MRSA by PCR NEGATIVE NEGATIVE Final    Comment:        The GeneXpert MRSA Assay (FDA approved for NASAL specimens only), is one component of a comprehensive MRSA colonization surveillance program. It is not intended to diagnose MRSA infection nor to guide or monitor treatment for MRSA infections. Performed at [facility], [address] [place], [place] [address]   Culture, respiratory     Status: None   Collection Time: 09/22/20 11:40 AM   Specimen: Tracheal Aspirate; Respiratory  Result Value Ref Range Status   Specimen Description   Final    TRACHEAL ASPIRATE Performed at [facility], [address] [place], [place] [address]    Special Requests   Final     NONE Performed at [facility], [address] [place],  [address]    Gram Stain   Final    ABUNDANT WBC PRESENT, PREDOMINANTLY PMN NO ORGANISMS SEEN    Culture   Final    RARE Normal respiratory flora-no Staph aureus or Pseudomonas seen Performed at [facility] [address], [place],  [address]    Report Status 09/25/2020 FINAL  Final  CULTURE, BLOOD (ROUTINE X 2) w Reflex to ID Panel     Status: Abnormal  Collection Time: 09/26/20 10:41 PM   Specimen: BLOOD  Result Value Ref Range Status   Specimen Description   Final    BLOOD BLOOD RIGHT HAND Performed at [facility], [address] [place], [place] [address]    Special Requests   Final    BOTTLES DRAWN AEROBIC AND ANAEROBIC Blood Culture results may not be optimal due to an excessive volume of blood received in culture bottles Performed at [facility], [address] [place], [place] [address]    Culture  Setup Time   Final    GRAM POSITIVE COCCI ANAEROBIC BOTTLE ONLY CRITICAL RESULT CALLED TO, READ BACK BY AND VERIFIED WITH: NATHAN BELUE AT 8338 09/28/20 SDR    Culture (A)  Final    STAPHYLOCOCCUS EPIDERMIDIS THE SIGNIFICANCE OF ISOLATING THIS ORGANISM FROM A SINGLE SET OF BLOOD CULTURES WHEN MULTIPLE SETS ARE DRAWN IS UNCERTAIN. PLEASE NOTIFY THE MICROBIOLOGY DEPARTMENT WITHIN ONE WEEK IF SPECIATION AND SENSITIVITIES ARE REQUIRED. Performed at [facility], [place] [address] [place], [place] [address]    Report Status 09/30/2020 FINAL  Final  Blood Culture ID Panel (Reflexed)     Status: Abnormal   Collection Time: 09/26/20 10:41 PM  Result Value Ref Range Status   Enterococcus faecalis NOT DETECTED NOT DETECTED Final   Enterococcus Faecium NOT DETECTED NOT DETECTED Final   Listeria monocytogenes NOT DETECTED NOT DETECTED Final   Staphylococcus species DETECTED (A) NOT DETECTED Final    Comment: CRITICAL RESULT CALLED TO, READ BACK BY AND VERIFIED WITH:   NATHAN BELUE AT 9767 09/28/20 SDR    Staphylococcus aureus (BCID) NOT DETECTED NOT DETECTED Final   Staphylococcus epidermidis DETECTED (A) NOT DETECTED Final    Comment: Methicillin (oxacillin) resistant coagulase negative staphylococcus. Possible blood culture contaminant (unless isolated from more than one blood culture draw or clinical case suggests pathogenicity). No antibiotic treatment is indicated for blood  culture contaminants. CRITICAL RESULT CALLED TO, READ BACK BY AND VERIFIED WITH:  NATHAN BELUE AT 3419 09/28/20 SDR    Staphylococcus lugdunensis NOT DETECTED NOT DETECTED Final   Streptococcus species NOT DETECTED NOT DETECTED Final   Streptococcus agalactiae NOT DETECTED NOT DETECTED Final   Streptococcus pneumoniae NOT DETECTED NOT DETECTED Final   Streptococcus pyogenes NOT DETECTED NOT DETECTED Final   A.calcoaceticus-baumannii NOT DETECTED NOT DETECTED Final   Bacteroides fragilis NOT DETECTED NOT DETECTED Final   Enterobacterales NOT DETECTED NOT DETECTED Final   Enterobacter cloacae complex NOT DETECTED NOT DETECTED Final   Escherichia coli NOT DETECTED NOT DETECTED Final   Klebsiella aerogenes NOT DETECTED NOT DETECTED Final   Klebsiella oxytoca NOT DETECTED NOT DETECTED Final   Klebsiella pneumoniae NOT DETECTED NOT DETECTED Final   Proteus species NOT DETECTED NOT DETECTED Final   Salmonella species NOT DETECTED NOT DETECTED Final   Serratia marcescens NOT DETECTED NOT DETECTED Final   Haemophilus influenzae NOT DETECTED NOT DETECTED Final   Neisseria meningitidis NOT DETECTED NOT DETECTED Final   Pseudomonas aeruginosa NOT DETECTED NOT DETECTED Final   Stenotrophomonas maltophilia NOT DETECTED NOT DETECTED Final   Candida albicans NOT DETECTED NOT DETECTED Final   Candida auris NOT DETECTED NOT DETECTED Final   Candida glabrata NOT DETECTED NOT DETECTED Final   Candida krusei NOT DETECTED NOT DETECTED Final   Candida parapsilosis NOT DETECTED NOT DETECTED  Final   Candida tropicalis NOT DETECTED NOT DETECTED Final   Cryptococcus neoformans/gattii NOT DETECTED NOT DETECTED Final   Methicillin resistance mecA/C DETECTED (A) NOT  DETECTED Final    Comment: CRITICAL RESULT CALLED TO, READ BACK BY AND VERIFIED WITH:  NATHAN BELUE AT 7829 09/28/20 SDR Performed at [name], [address] [place], [place] [address]   CULTURE, BLOOD (ROUTINE X 2) w Reflex to ID Panel     Status: None   Collection Time: 09/26/20 11:57 PM   Specimen: BLOOD  Result Value Ref Range Status   Specimen Description BLOOD RIGHT RADIAL  Final   Special Requests   Final    BOTTLES DRAWN AEROBIC AND ANAEROBIC Blood Culture adequate volume   Culture   Final    NO GROWTH 5 DAYS Performed at [facility], [address] [place], [place] [address]    Report Status 10/02/2020 FINAL  Final    CBC    Component Value Date/Time   WBC 11.0 (H) 10/05/2020 0720   RBC 5.40 10/05/2020 0720   HGB 16.0 10/05/2020 0720   HGB 15.4 05/05/2014 0519   HCT 52.2 (H) 10/05/2020 0720   HCT 44.2 05/05/2014 0519   PLT 242 10/05/2020 0720   PLT 164 05/05/2014 0519   MCV 96.7 10/05/2020 0720   MCV 93 05/05/2014 0519   MCH 29.6 10/05/2020 0720   MCHC 30.7 10/05/2020 0720   RDW 13.2 10/05/2020 0720   RDW 12.0 05/05/2014 0519   LYMPHSABS 0.8 10/05/2020 0720   LYMPHSABS 2.0 05/05/2014 0519   MONOABS 1.5 (H) 10/05/2020 0720   MONOABS 1.0 05/05/2014 0519   EOSABS 0.0 10/05/2020 0720   EOSABS 0.1 05/05/2014 0519   BASOSABS 0.0 10/05/2020 0720   BASOSABS 0.0 05/05/2014 0519   BMP Latest Ref Rng & Units 10/04/2020 10/03/2020 10/02/2020  Glucose 70 - 99 mg/dL 123(H) 122(H) 139(H)  BUN 6 - 20 mg/dL 35(H) 25(H) 30(H)  Creatinine 0.61 - 1.24 mg/dL 0.74 0.48(L) 0.49(L)  Sodium 135 - 145 mmol/L 149(H) 146(H) 145  Potassium 3.5 - 5.1 mmol/L 3.3(L) 3.6 3.4(L)  Chloride 98 - 111 mmol/L 97(L) 103 103  CO2 22 - 32 mmol/L 41(H) 36(H) 36(H)  Calcium 8.9 - 10.3 mg/dL 9.4 7.4(L)  7.3(L)     ASSESSMENT AND PLAN SYNOPSIS SEVERE Acute hypoxemic respiratory failure due to CT chest with impressive Left lung pneumonia complicated by  Acute metabolic encephalopathy related to critical illness,  Acute kidney injury due to ATN, associated with Class III obesity, GERD,Depression  Severe ACUTE Hypoxic and Hypercapnic Respiratory Failure High risk for intubation [place] fio2 as tolerated  ACUTE DIASTOLIC CARDIAC FAILURE-  -oxygen as needed -Lasix as tolerated   Morbid obesity, possible OSA.   Will certainly impact respiratory mechanics,    ACUTE KIDNEY INJURY/Renal Failure -continue Foley Catheter-assess need -Avoid nephrotoxic agents -Follow urine output, BMP -Ensure adequate renal perfusion, optimize oxygenation -Renal dose medications     NEUROLOGY Acute toxic metabolic encephalopathy-avoid sedatives  SEPSIS-present on admission due to pneumonia -use vasopressors to keep MAP>65 if meeded  CARDIAC ICU monitoring  INFECTIOUS DISEASE -continue antibiotics as prescribed -follow up cultures -follow up ID consultation    GI GI PROPHYLAXIS as indicated  NUTRITIONAL STATUS Nutrition Status: Nutrition Problem: Inadequate oral intake Etiology: inability to eat Signs/Symptoms: NPO status Interventions: Prostat,Tube feeding,MVI   DIET-->NPO Constipation protocol as indicated  ENDO - will use ICU hypoglycemic\Hyperglycemia protocol if indicated     ELECTROLYTES -follow labs as needed -replace as needed -pharmacy consultation and following   DVT/GI PRX ordered and assessed TRANSFUSIONS AS NEEDED MONITOR FSBS I Assessed the need for Labs I Assessed the need for  Foley I Assessed the need for Central Venous Line Family Discussion when available I Assessed the need for Mobilization I made an Assessment of medications to be adjusted accordingly Safety Risk assessment completed   CASE DISCUSSED IN MULTIDISCIPLINARY ROUNDS WITH ICU  TEAM  Critical Care Time devoted to patient care services described in this note is 45 minutes.   Overall, patient is critically ill, prognosis is guarded.  Patient with Multiorgan failure and at high risk for cardiac arrest and death.    Corrin Parker, M.D.  [facility]  Medical Director [place] Director [facility] Cardio-Pulmonary Department

## 2020-10-05 NOTE — Progress Notes (Signed)
GOALS OF CARE DISCUSSION  The Clinical status was relayed to family in detail. Father over the phone  Updated and notified of patients medical condition.  patient with increased WOB  Explained to family course of therapy and the modalities    Family understands the situation.   Family are satisfied with Plan of action and management. All questions answered  Additional CC time 32 mins   Mayjor Ager Santiago Glad, M.D.  [facility]  Medical Director [facility] Medical Director [facility] Cardio-Pulmonary Department

## 2020-10-05 NOTE — Progress Notes (Signed)
CH visited pt. per OR for AD from [facility] NP; pt. lying down in bed awake when [facility] entered; CH discussed AD w/pt. and confirmed that he would like to appoint his fiance Eddie Chapman [phone]) as MPOA; he appoints his father Eddie Chapman. ([phone]) as alt. MPOA.  Per pt, concern w/having father remain healthcare surrogate decision maker was that he did not think his father would want to make these decisions, as he has deferred to [facility] in the past.  Pt. confirmed several times, w/hekp of [name] at bedside, that he did want father kept on AD as alt. MPOA.  Pt. does not want life-prolonging measures continued in case of incurable illness likely leading to imminent death or in case of severe loss of ability to think, but he does want life prolonging measures continued in case of apparently permanent unconsciousness.  Pt. restricts health care agents to following wishes expressed in Living Will.  CH found witnesses and notary --> document notarized, filed in paper chart, added to queue for scanning to [facility].  CH remains available as needed.

## 2020-10-05 NOTE — Progress Notes (Signed)
PHARMACY CONSULT NOTE - FOLLOW UP  Pharmacy Consult for Electrolyte Monitoring and Replacement   Recent Labs: Potassium (mmol/L)  Date Value  10/05/2020 2.7 (LL)  05/04/2014 4.3   Magnesium (mg/dL)  Date Value  14/97/0263 2.0   Calcium (mg/dL)  Date Value  78/58/8502 9.2   Calcium, Total (mg/dL)  Date Value  77/41/2878 9.1   Albumin (g/dL)  Date Value  67/67/2094 2.9 (L)   Phosphorus (mg/dL)  Date Value  70/96/2836 3.0   Sodium (mmol/L)  Date Value  10/05/2020 150 (H)  05/04/2014 140     Assessment: 60 year old male presented with AMS. Patient has been on diuresis with electrolyte abnormalities. Pharmacy to manage electrolytes.  Goal of Therapy:  Electrolytes WNL  Plan:  Na 146>>150. May be related to diuresis. Lasix discontinued today. Hypokalemia s/t diuresis as well. Patient received one run of IV potassium as well as 40 mEq PO. Expect to improve now that patient with better oral intake and diuresis discontinued. Follow up with morning labs.  Pricilla Riffle ,PharmD Clinical Pharmacist 10/05/2020 2:25 PM

## 2020-10-06 DIAGNOSIS — R6521 Severe sepsis with septic shock: Secondary | ICD-10-CM | POA: Diagnosis not present

## 2020-10-06 DIAGNOSIS — A419 Sepsis, unspecified organism: Secondary | ICD-10-CM | POA: Diagnosis not present

## 2020-10-06 DIAGNOSIS — J189 Pneumonia, unspecified organism: Secondary | ICD-10-CM | POA: Diagnosis not present

## 2020-10-06 LAB — RENAL FUNCTION PANEL
Albumin: 2.9 g/dL — ABNORMAL LOW (ref 3.5–5.0)
Anion gap: 11 (ref 5–15)
BUN: 31 mg/dL — ABNORMAL HIGH (ref 6–20)
CO2: 37 mmol/L — ABNORMAL HIGH (ref 22–32)
Calcium: 9.3 mg/dL (ref 8.9–10.3)
Chloride: 104 mmol/L (ref 98–111)
Creatinine, Ser: 0.62 mg/dL (ref 0.61–1.24)
GFR, Estimated: >60 mL/min (ref >60)
Glucose, Bld: 117 mg/dL — ABNORMAL HIGH (ref 70–99)
Phosphorus: 3.2 mg/dL (ref 2.5–4.6)
Potassium: 2.6 mmol/L — CL (ref 3.5–5.1)
Sodium: 152 mmol/L — ABNORMAL HIGH (ref 135–145)

## 2020-10-06 LAB — CBC WITH DIFFERENTIAL/PLATELET
Abs Immature Granulocytes: 0.05 10*3/uL (ref 0.00–0.07)
Basophils Absolute: 0 10*3/uL (ref 0.0–0.1)
Basophils Relative: 0 %
Eosinophils Absolute: 0 10*3/uL (ref 0.0–0.5)
Eosinophils Relative: 0 %
HCT: 53.4 % — ABNORMAL HIGH (ref 39.0–52.0)
Hemoglobin: 16.3 g/dL (ref 13.0–17.0)
Immature Granulocytes: 1 %
Lymphocytes Relative: 8 %
Lymphs Abs: 0.8 10*3/uL (ref 0.7–4.0)
MCH: 30.1 pg (ref 26.0–34.0)
MCHC: 30.5 g/dL (ref 30.0–36.0)
MCV: 98.7 fL (ref 80.0–100.0)
Monocytes Absolute: 1.1 10*3/uL — ABNORMAL HIGH (ref 0.1–1.0)
Monocytes Relative: 11 %
Neutro Abs: 7.9 10*3/uL — ABNORMAL HIGH (ref 1.7–7.7)
Neutrophils Relative %: 80 %
Platelets: 216 10*3/uL (ref 150–400)
RBC: 5.41 MIL/uL (ref 4.22–5.81)
RDW: 13.5 % (ref 11.5–15.5)
WBC: 9.9 10*3/uL (ref 4.0–10.5)
nRBC: 0 % (ref 0.0–0.2)

## 2020-10-06 LAB — GLUCOSE, CAPILLARY
Glucose-Capillary: 113 mg/dL — ABNORMAL HIGH (ref 70–99)
Glucose-Capillary: 208 mg/dL — ABNORMAL HIGH (ref 70–99)
Glucose-Capillary: 123 mg/dL — ABNORMAL HIGH (ref 70–99)
Glucose-Capillary: 132 mg/dL — ABNORMAL HIGH (ref 70–99)
Glucose-Capillary: 125 mg/dL — ABNORMAL HIGH (ref 70–99)

## 2020-10-06 LAB — MAGNESIUM: Magnesium: 2.2 mg/dL (ref 1.7–2.4)

## 2020-10-06 MED ORDER — METOPROLOL SUCCINATE ER 100 MG PO TB24
100.0000 mg | ORAL_TABLET | Freq: Every day | ORAL | Status: DC
  Administered 2020-10-06 – 2020-10-08 (×3): 100 mg via ORAL
  Filled 2020-10-06: qty 2
  Filled 2020-10-06: qty 1
  Filled 2020-10-06: qty 2

## 2020-10-06 MED ORDER — SACUBITRIL-VALSARTAN 24-26 MG PO TABS
1.0000 | ORAL_TABLET | Freq: Two times a day (BID) | ORAL | Status: DC
  Administered 2020-10-06 – 2020-10-09 (×8): 1 via ORAL
  Filled 2020-10-06 (×11): qty 1

## 2020-10-06 MED ORDER — ENSURE ENLIVE PO LIQD
237.0000 mL | Freq: Two times a day (BID) | ORAL | Status: DC
  Administered 2020-10-07 (×2): 237 mL via ORAL

## 2020-10-06 MED ORDER — POTASSIUM CHLORIDE 20 MEQ PO PACK
40.0000 meq | PACK | ORAL | Status: AC
  Administered 2020-10-06 (×3): 40 meq via ORAL
  Filled 2020-10-06 (×3): qty 2

## 2020-10-06 NOTE — Progress Notes (Signed)
Patient more alert and awake Weaning down oxygen Responding well to ABX Eating breakfast this AM  Transfer to SD status and TRH service

## 2020-10-06 NOTE — Progress Notes (Signed)
CRITICAL CARE NOTE  60 yo M with hx of cerebral aneurysm, PVD, HFrEF, pulmonary htn, morbid obesity, OSA, GERD, hx of TBI, dyslipidemia who came in after girlfriend found him to be unresponsive. He was found to be severely hypoxemic in circulatory shock with left lung infiltrate on CXR. Labwork consistent with sepsis.   Events:  09/22/20- patient is off vasopressors. He is weaning on FIO2. Family at bedside (girlfriend). Patient is weaned to 40% FIO2. 09/23/20-patient failed SBT today. 09/25/20-patient with continued tenacious secretions and patient also with altered mentation. Started Precedex in hopes to be able to wean Versed. As patient extremely altered and difficult to arouse. CT head and CT chest obtained 09/26/20-Pateint following commands intermittently but agitated.  SBT attempted this morning and failed. Patient was taking TV of 200 on 10/5. RR increased to 40. Placed back on rate and sedaiton increased.  09/27/2020-patient attempted another spontaneous breathing trial. Patient becomes extremely agitated. Patient takes shallow breaths and has high respiratory rate. 12/30-remains heavily sedated 09/29/2020:Poorly responsive when sedation is lightened, does not tolerate SBT 10/01/2019:Not tolerating SBT, will need goals of care addressed 10/02/2019:More responsive, still unable to tolerate SBT well 10/03/19- failed SBT today, palliative care evaluation in process 10/04/19- patient passed SBT and was extubated to BIPAP. I spoke with patients father and met with GF at bedside for a second update. 1/5 remains extubated, high risk for intubation 1/6 remains on high flow high risk for intubation     CC  follow up respiratory failure  SUBJECTIVE Patient remains critically ill Prognosis is guarded morbid obesity On high flow  FiO2 (%):  [40 %-75 %] 40 %    BP (!) 152/107   Pulse (!) 117   Temp 99.2 F (37.3 C) (Oral)   Resp (!) 25   Ht '5\' 10"'  (1.778 m)    Wt 136 kg   SpO2 94%   BMI 43.02 kg/m    I/O last 3 completed shifts: In: 4059.7 [P.O.:1307; NG/GT:948; IV Piggyback:1804.7] Out: 2301 [Urine:2300; Stool:1] No intake/output data recorded.  SpO2: 94 % O2 Flow Rate (L/min): 40 L/min FiO2 (%): 40 %  Estimated body mass index is 43.02 kg/m as calculated from the following:   Height as of this encounter: '5\' 10"'  (1.778 m).   Weight as of this encounter: 136 kg.   REVIEW OF SYSTEMS  PATIENT IS UNABLE TO PROVIDE COMPLETE REVIEW OF SYSTEMS DUE TO SEVERE CRITICAL ILLNESS, very lethargic       PHYSICAL EXAMINATION:  GENERAL:critically ill appearing, +resp distress HEAD: Normocephalic, atraumatic.  EYES: Pupils equal, round, reactive to light.  No scleral icterus.  MOUTH: Moist mucosal membrane. NECK: Supple.  PULMONARY: +rhonchi, +wheezing CARDIOVASCULAR: S1 and S2. Regular rate and rhythm. No murmurs, rubs, or gallops.  GASTROINTESTINAL: Soft, nontender, -distended.  Positive bowel sounds.   MUSCULOSKELETAL: No swelling, clubbing, or edema.  NEUROLOGIC: lethargic SKIN:intact,warm,dry  MEDICATIONS: I have reviewed all medications and confirmed regimen as documented   CULTURE RESULTS   Recent Results (from the past 240 hour(s))  CULTURE, BLOOD (ROUTINE X 2) w Reflex to ID Panel     Status: Abnormal   Collection Time: 09/26/20 10:41 PM   Specimen: BLOOD  Result Value Ref Range Status   Specimen Description   Final    BLOOD BLOOD RIGHT HAND Performed at [facility], [address], [place], [place] [address]    Special Requests   Final    BOTTLES DRAWN AEROBIC AND ANAEROBIC Blood Culture results may not be optimal  due to an excessive volume of blood received in culture bottles Performed at [facility], [address] [place], [place] [address]    Culture  Setup Time   Final    GRAM POSITIVE COCCI ANAEROBIC BOTTLE ONLY CRITICAL RESULT CALLED TO, READ BACK BY AND VERIFIED WITH: NATHAN BELUE AT  7867 09/28/20 SDR    Culture (A)  Final    STAPHYLOCOCCUS EPIDERMIDIS THE SIGNIFICANCE OF ISOLATING THIS ORGANISM FROM A SINGLE SET OF BLOOD CULTURES WHEN MULTIPLE SETS ARE DRAWN IS UNCERTAIN. PLEASE NOTIFY THE MICROBIOLOGY DEPARTMENT WITHIN ONE WEEK IF SPECIATION AND SENSITIVITIES ARE REQUIRED. Performed at [facility], [place] [address] [place], [place] [address]    Report Status 09/30/2020 FINAL  Final  Blood Culture ID Panel (Reflexed)     Status: Abnormal   Collection Time: 09/26/20 10:41 PM  Result Value Ref Range Status   Enterococcus faecalis NOT DETECTED NOT DETECTED Final   Enterococcus Faecium NOT DETECTED NOT DETECTED Final   Listeria monocytogenes NOT DETECTED NOT DETECTED Final   Staphylococcus species DETECTED (A) NOT DETECTED Final    Comment: CRITICAL RESULT CALLED TO, READ BACK BY AND VERIFIED WITH:  NATHAN BELUE AT 4709 09/28/20 SDR    Staphylococcus aureus (BCID) NOT DETECTED NOT DETECTED Final   Staphylococcus epidermidis DETECTED (A) NOT DETECTED Final    Comment: Methicillin (oxacillin) resistant coagulase negative staphylococcus. Possible blood culture contaminant (unless isolated from more than one blood culture draw or clinical case suggests pathogenicity). No antibiotic treatment is indicated for blood  culture contaminants. CRITICAL RESULT CALLED TO, READ BACK BY AND VERIFIED WITH:  NATHAN BELUE AT 6283 09/28/20 SDR    Staphylococcus lugdunensis NOT DETECTED NOT DETECTED Final   Streptococcus species NOT DETECTED NOT DETECTED Final   Streptococcus agalactiae NOT DETECTED NOT DETECTED Final   Streptococcus pneumoniae NOT DETECTED NOT DETECTED Final   Streptococcus pyogenes NOT DETECTED NOT DETECTED Final   A.calcoaceticus-baumannii NOT DETECTED NOT DETECTED Final   Bacteroides fragilis NOT DETECTED NOT DETECTED Final   Enterobacterales NOT DETECTED NOT DETECTED Final   Enterobacter cloacae complex NOT DETECTED NOT DETECTED Final   Escherichia coli NOT  DETECTED NOT DETECTED Final   Klebsiella aerogenes NOT DETECTED NOT DETECTED Final   Klebsiella oxytoca NOT DETECTED NOT DETECTED Final   Klebsiella pneumoniae NOT DETECTED NOT DETECTED Final   Proteus species NOT DETECTED NOT DETECTED Final   Salmonella species NOT DETECTED NOT DETECTED Final   Serratia marcescens NOT DETECTED NOT DETECTED Final   Haemophilus influenzae NOT DETECTED NOT DETECTED Final   Neisseria meningitidis NOT DETECTED NOT DETECTED Final   Pseudomonas aeruginosa NOT DETECTED NOT DETECTED Final   Stenotrophomonas maltophilia NOT DETECTED NOT DETECTED Final   Candida albicans NOT DETECTED NOT DETECTED Final   Candida auris NOT DETECTED NOT DETECTED Final   Candida glabrata NOT DETECTED NOT DETECTED Final   Candida krusei NOT DETECTED NOT DETECTED Final   Candida parapsilosis NOT DETECTED NOT DETECTED Final   Candida tropicalis NOT DETECTED NOT DETECTED Final   Cryptococcus neoformans/gattii NOT DETECTED NOT DETECTED Final   Methicillin resistance mecA/C DETECTED (A) NOT DETECTED Final    Comment: CRITICAL RESULT CALLED TO, READ BACK BY AND VERIFIED WITHLloyd Huger AT 6629 09/28/20 SDR Performed at [facility], [address] [place], [place] [address]   CULTURE, BLOOD (ROUTINE X 2) w Reflex to ID Panel     Status: None   Collection Time: 09/26/20 11:57 PM   Specimen: BLOOD  Result Value Ref Range Status   Specimen Description BLOOD RIGHT RADIAL  Final   Special Requests   Final    BOTTLES DRAWN AEROBIC AND ANAEROBIC Blood Culture adequate volume   Culture   Final    NO GROWTH 5 DAYS Performed at [facility], [address], [place], [place] [address]    Report Status 10/02/2020 FINAL  Final          IMAGING    No results found.   Nutrition Status: Nutrition Problem: Inadequate oral intake Etiology: inability to eat Signs/Symptoms: NPO status Interventions: Prostat,Tube feeding,MVI     Indwelling Urinary Catheter  continued, requirement due to   Reason to continue Indwelling Urinary Catheter strict Intake/Output monitoring for hemodynamic instability   Central Line/ continued, requirement due to  Reason to continue [place] of central venous pressure or other hemodynamic parameters and poor IV access       ASSESSMENT AND PLAN SYNOPSIS  SEVERE Acute hypoxemic respiratory failure due to CT chest with impressive Left lung pneumonia complicated by  Acute metabolic encephalopathy related to critical illness,  Acute kidney injury due to ATN, associated with morbid  obesity, GERD,Depression    Severe ACUTE Hypoxic and Hypercapnic Respiratory Failure High risk for re-intubation  ACUTE DIASTOLIC CARDIAC FAILURE-  -oxygen as needed -Lasix as tolerated  Morbid obesity, possible OSA.   Will certainly impact respiratory mechanics  ACUTE KIDNEY INJURY/Renal Failure -continue Foley Catheter-assess need -Avoid nephrotoxic agents -Follow urine output, BMP -Ensure adequate renal perfusion, optimize oxygenation -Renal dose medications     NEUROLOGY Acute toxic metabolic encephalopathy due to hypoxia-avoid sedatives   SHOCK-SEPSIS Off pressors   CARDIAC ICU monitoring  ID -continue IV abx as prescibed -follow up cultures  GI GI PROPHYLAXIS as indicated  NUTRITIONAL STATUS Nutrition Status: Nutrition Problem: Inadequate oral intake Etiology: inability to eat Signs/Symptoms: NPO status Interventions: Prostat,Tube feeding,MVI   DIET-->as tolerated Constipation protocol as indicated  ENDO - will use ICU hypoglycemic\Hyperglycemia protocol if indicated     ELECTROLYTES -follow labs as needed -replace as needed -pharmacy consultation and following   DVT/GI PRX ordered and assessed TRANSFUSIONS AS NEEDED MONITOR FSBS I Assessed the need for Labs I Assessed the need for Foley I Assessed the need for Central Venous Line Family Discussion when available I  Assessed the need for Mobilization I made an Assessment of medications to be adjusted accordingly Safety Risk assessment completed   CASE DISCUSSED IN MULTIDISCIPLINARY ROUNDS WITH ICU TEAM  Critical Care Time devoted to patient care services described in this note is 45 minutes.   Overall, patient is critically ill, prognosis is guarded.  Patient with Multiorgan failure and at high risk for cardiac arrest and death.    Corrin Parker, M.D.  [facility]  Medical Director [employer] Director [facility] Cardio-Pulmonary Department

## 2020-10-06 NOTE — Progress Notes (Signed)
OT Cancellation Note  Patient Details Name: Eddie Chapman MRN: [id number] DOB: 02-22-1961   Cancelled Treatment:    Reason Eval/Treat Not Completed: Medical issues which prohibited therapy. New consult received, chart reviewed.Pt noted with continued and worsening critical low K+ (2.6), sodium elevated (152), and tachycardic with continued high RR. Per therapy protocol, contraindicated for participation in therapy at this time. Will continue to hold OT evaluation and re-attempt at later date/time as medically appropriate for exertional activity.  Jeni Salles, MPH, MS, OTR/L ascom [phone] 10/06/20, 10:54 AM

## 2020-10-06 NOTE — Progress Notes (Signed)
PHARMACY CONSULT NOTE - FOLLOW UP  Pharmacy Consult for Electrolyte Monitoring and Replacement   Recent Labs: Potassium (mmol/L)  Date Value  10/06/2020 2.6 (LL)  05/04/2014 4.3   Magnesium (mg/dL)  Date Value  10/06/2020 2.2   Calcium (mg/dL)  Date Value  10/06/2020 9.3   Calcium, Total (mg/dL)  Date Value  05/04/2014 9.1   Albumin (g/dL)  Date Value  10/06/2020 2.9 (L)   Phosphorus (mg/dL)  Date Value  10/06/2020 3.2   Sodium (mmol/L)  Date Value  10/06/2020 152 (H)  05/04/2014 140     Assessment: 60 year old male presented with AMS. Patient has been on diuresis with electrolyte abnormalities. Pharmacy to manage electrolytes.  Goal of Therapy:  Electrolytes WNL  Plan:  Na 146>>150>152. May be related to diuresis and decreased oral intake. Lasix discontinued 1/6. Patient working with speech 1/7 per RN, so will hopefully be able to increase oral water intake. Continue to follow closely.   Hypokalemia persistent despite replacement. Will give potassium 40 mEq oral x 3 doses. Restarting Entresto which may also increase potassium as well.  Follow up electrolytes with morning labs.  Tawnya Crook ,PharmD Clinical Pharmacist 10/06/2020 11:07 AM

## 2020-10-06 NOTE — Progress Notes (Signed)
Nutrition Follow-up  DOCUMENTATION CODES:   Morbid obesity  INTERVENTION:  Provide Ensure Enlive po BID, each supplement provides 350 kcal and 20 grams of protein.  NUTRITION DIAGNOSIS:   Increased nutrient needs related to catabolic illness (heart failure) as evidenced by estimated needs.  New nutrition diagnosis.  GOAL:   Patient will meet greater than or equal to 90% of their needs  Progressing.  MONITOR:   PO intake,Supplement acceptance,Diet advancement,Labs,Weight trends,I & O's  REASON FOR ASSESSMENT:   Ventilator,Consult Enteral/tube feeding initiation and management  ASSESSMENT:   60 year old male with PMHx of cerebral aneurysm, TBI, HLD, HTN, depression, GERD, HFrEF, OSA admitted with severe hypoxemia, circulatory shock, left lung consolidated PNA.  12/23 intubated 1/5 extubated and diet advanced to dysphagia 1 with nectar-thick liquids 1/7 diet advanced to dysphagia 1 with thin liquids  Met with patient at bedside. He reports he is excited to be on thin liquids now. He has been able to eat well at meals and is tolerating dysphagia 1 diet. Per chart he is eating 95-100% of his meals today. Patient amenable to drinking Ensure to help meet calorie/protein needs.  Medications reviewed and include: Colace 200 mg BID, Novolog 0-15 units TID, Novolog 0-5 units QHS, MVI daily, Protonix, Miralax, potassium chloride 40 mEq Q4hrs, meropenem.  Labs reviewed: CBG 113-208, Sodium 152, Potassium 2.6, CO2 37, BUN 31.  I/O: 1250 ml UOP yesterday (0.4 mL/kg/hr)  Weight trend: 136 kg on 1/7; -9.9 kg from 12/25  Discussed with RN.  Diet Order:   Diet Order            DIET - DYS 1 Room service appropriate? Yes with Assist; Fluid consistency: Thin  Diet effective now                EDUCATION NEEDS:   No education needs have been identified at this time  Skin:  Skin Assessment: Reviewed RN Assessment  Last BM:  10/07/2019 - large type 7  Height:   Ht Readings  from Last 1 Encounters:  09/21/20 '5\' 10"'  (1.778 m)   Weight:   Wt Readings from Last 1 Encounters:  10/06/20 136 kg   Ideal Body Weight:  75.5 kg  BMI:  Body mass index is 43.02 kg/m.  Estimated Nutritional Needs:   Kcal:  2700-3000kcal/day  Protein:  >135g/day  Fluid:  2-2.3 L/day  Jacklynn Barnacle, MS, RD, LDN Pager number available on Amion
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
## 2020-10-07 ENCOUNTER — Inpatient Hospital Stay: Payer: PPO

## 2020-10-07 DIAGNOSIS — I5031 Acute diastolic (congestive) heart failure: Secondary | ICD-10-CM | POA: Diagnosis present

## 2020-10-07 DIAGNOSIS — N179 Acute kidney failure, unspecified: Secondary | ICD-10-CM | POA: Diagnosis not present

## 2020-10-07 DIAGNOSIS — J189 Pneumonia, unspecified organism: Secondary | ICD-10-CM | POA: Diagnosis present

## 2020-10-07 DIAGNOSIS — G9341 Metabolic encephalopathy: Secondary | ICD-10-CM | POA: Diagnosis not present

## 2020-10-07 DIAGNOSIS — J9601 Acute respiratory failure with hypoxia: Secondary | ICD-10-CM | POA: Diagnosis not present

## 2020-10-07 DIAGNOSIS — G4733 Obstructive sleep apnea (adult) (pediatric): Secondary | ICD-10-CM

## 2020-10-07 DIAGNOSIS — Z6841 Body Mass Index (BMI) 40.0 and over, adult: Secondary | ICD-10-CM

## 2020-10-07 DIAGNOSIS — J9602 Acute respiratory failure with hypercapnia: Secondary | ICD-10-CM | POA: Diagnosis present

## 2020-10-07 DIAGNOSIS — E662 Morbid (severe) obesity with alveolar hypoventilation: Secondary | ICD-10-CM | POA: Diagnosis present

## 2020-10-07 LAB — CBC WITH DIFFERENTIAL/PLATELET
Abs Immature Granulocytes: 0.04 10*3/uL (ref 0.00–0.07)
Basophils Absolute: 0 10*3/uL (ref 0.0–0.1)
Basophils Relative: 0 %
Eosinophils Absolute: 0.1 10*3/uL (ref 0.0–0.5)
Eosinophils Relative: 1 %
HCT: 51.8 % (ref 39.0–52.0)
Hemoglobin: 16 g/dL (ref 13.0–17.0)
Immature Granulocytes: 0 %
Lymphocytes Relative: 9 %
Lymphs Abs: 0.9 10*3/uL (ref 0.7–4.0)
MCH: 30.4 pg (ref 26.0–34.0)
MCHC: 30.9 g/dL (ref 30.0–36.0)
MCV: 98.3 fL (ref 80.0–100.0)
Monocytes Absolute: 0.9 10*3/uL (ref 0.1–1.0)
Monocytes Relative: 10 %
Neutro Abs: 7.3 10*3/uL (ref 1.7–7.7)
Neutrophils Relative %: 80 %
Platelets: 216 10*3/uL (ref 150–400)
RBC: 5.27 MIL/uL (ref 4.22–5.81)
RDW: 13.1 % (ref 11.5–15.5)
WBC: 9.2 10*3/uL (ref 4.0–10.5)
nRBC: 0 % (ref 0.0–0.2)

## 2020-10-07 LAB — GLUCOSE, CAPILLARY
Glucose-Capillary: 118 mg/dL — ABNORMAL HIGH (ref 70–99)
Glucose-Capillary: 161 mg/dL — ABNORMAL HIGH (ref 70–99)
Glucose-Capillary: 144 mg/dL — ABNORMAL HIGH (ref 70–99)
Glucose-Capillary: 133 mg/dL — ABNORMAL HIGH (ref 70–99)

## 2020-10-07 LAB — RENAL FUNCTION PANEL
Albumin: 2.7 g/dL — ABNORMAL LOW (ref 3.5–5.0)
Anion gap: 8 (ref 5–15)
BUN: 24 mg/dL — ABNORMAL HIGH (ref 6–20)
CO2: 36 mmol/L — ABNORMAL HIGH (ref 22–32)
Calcium: 8.8 mg/dL — ABNORMAL LOW (ref 8.9–10.3)
Chloride: 102 mmol/L (ref 98–111)
Creatinine, Ser: 0.48 mg/dL — ABNORMAL LOW (ref 0.61–1.24)
GFR, Estimated: >60 mL/min (ref >60)
Glucose, Bld: 116 mg/dL — ABNORMAL HIGH (ref 70–99)
Phosphorus: 2.7 mg/dL (ref 2.5–4.6)
Potassium: 3.2 mmol/L — ABNORMAL LOW (ref 3.5–5.1)
Sodium: 146 mmol/L — ABNORMAL HIGH (ref 135–145)

## 2020-10-07 LAB — MAGNESIUM: Magnesium: 2.1 mg/dL (ref 1.7–2.4)

## 2020-10-07 MED ORDER — IPRATROPIUM-ALBUTEROL 0.5-2.5 (3) MG/3ML IN SOLN
3.0000 mL | Freq: Four times a day (QID) | RESPIRATORY_TRACT | Status: DC
  Administered 2020-10-07 – 2020-10-08 (×4): 3 mL via RESPIRATORY_TRACT
  Filled 2020-10-07 (×5): qty 3

## 2020-10-07 MED ORDER — POTASSIUM CHLORIDE 10 MEQ/100ML IV SOLN
10.0000 meq | INTRAVENOUS | Status: AC
  Administered 2020-10-07 (×2): 10 meq via INTRAVENOUS
  Filled 2020-10-07 (×6): qty 100

## 2020-10-07 MED ORDER — POTASSIUM CHLORIDE 10 MEQ/100ML IV SOLN
10.0000 meq | INTRAVENOUS | Status: AC
  Administered 2020-10-07 – 2020-10-08 (×2): 10 meq via INTRAVENOUS
  Filled 2020-10-07 (×4): qty 100

## 2020-10-07 NOTE — Evaluation (Signed)
Occupational Therapy Evaluation Patient Details Name: Eddie Chapman MRN: [id number] DOB: [birth year] Today's Date: 10/07/2020    History of Present Illness 60yo male presented to ER after found unresponsive, severely hypoxemic; admitted for management of acute hypoxic respiratory failure, septic shock due to consolidated L lung PNA.  Intubated 12/23-1/4; weaned to [place] at this time.   Clinical Impression   Pt was seen for OT evaluation and co-tx with PT this date. Prior to hospital admission, pt was independent, using SPC for mobility. Pt lives with his girlfriend/significant other. Pt eager to participate and eager to "get stronger like I used to be." Pt generally weak, B hand and B feet pitting edema noted. On 6L [place] throughout session. Pt hyper verbal at times, requiring cues to redirect to task. Follows commands well. Min A +2 for sup>sit EOB with additional time/effort to perform with cues to continue and hand/foot placement. Once EOB, required initial Min A +1 for static sitting balance, improving to SBA-CGA for dynamic reaching activity in all planes with no LOB. RR 20's-40's with exertion, O2 sats generally in 90's with brief drop into mid 80's. Cues for PLB throughout session. Min-Mod A +2 for STS transfer with RW. Promptly returned to sitting as pt began to have loose BM and unsafe to attempt [facility] transfer at this time. Total A +2 for return to supine (largely limited by positioning near end of bed). Bed level toileting completed with bed linens change and gown change. Max A +2 for rolling, Min-Max A +1 to maintain sidelying. Pt tolerated session well this date both in terms of functional performance and cardiopulmonary status. As compared to PT evaluation a couple days ago, pt has demonstrated significant improvement. Given noted improvements this session and motivation to return to PLOF, pt will benefit from additional skilled high-intensity OT services and acute in-patient rehab upon  discharge.      Follow Up Recommendations  CIR    Equipment Recommendations  3 in 1 bedside commode    Recommendations for Other Services Rehab consult     Precautions / Restrictions Precautions Precautions: Fall Restrictions Weight Bearing Restrictions: No      Mobility Bed Mobility Overal bed mobility: Needs Assistance Bed Mobility: Supine to Sit;Sit to Supine     Supine to sit: Min assist;+2 for physical assistance Sit to supine: Total assist;+2 for physical assistance   General bed mobility comments: Min A +2 and cues + additional time to perform    Transfers Overall transfer level: Needs assistance Equipment used: Rolling walker (2 wheeled) Transfers: Sit to/from Stand Sit to Stand: Min assist;Mod assist;+2 physical assistance         General transfer comment: limited in full standing only by urgent soft BM upon standing and need to return to sitting to maximize safety    Balance Overall balance assessment: Needs assistance Sitting-balance support: No upper extremity supported;Feet supported;Single extremity supported Sitting balance-Leahy Scale: Fair Sitting balance - Comments: initially requiring Min A +1 for static sitting, improving to SBA and able to perform dynamic reaching in all planes without LOB   Standing balance support: Bilateral upper extremity supported Standing balance-Leahy Scale: Poor                             ADL either performed or assessed with clinical judgement   ADL Overall ADL's : Needs assistance/impaired  Lower Body Dressing: Bed level;Moderate assistance Lower Body Dressing Details (indicate cue type and reason): Initially attempted, but ultimately limited by large abdomen and weakness; OT required to initiate threading of socks over B feet and pt able to complete donning over feet, cues to [employer] Transfer Details (indicate cue type and reason): unsafe to attempt Toileting-  Clothing Manipulation and Hygiene: Bed level;+2 for physical assistance;Maximal assistance Toileting - Clothing Manipulation Details (indicate cue type and reason): returned to bed for urgent BM, max A +2 for rolling and pericare during bed level toileting             Vision Patient Visual Report: No change from baseline       Perception     Praxis      Pertinent Vitals/Pain Pain Assessment: No/denies pain     Hand Dominance Right   Extremity/Trunk Assessment Upper Extremity Assessment Upper Extremity Assessment: Generalized weakness   Lower Extremity Assessment Lower Extremity Assessment: Generalized weakness (grossly 3+/5 to 4/5 bilat, B feet w/ +1 pitting edema)       Communication Communication Communication: Other (comment) (hyperverbal at times)   Cognition Arousal/Alertness: Awake/alert Behavior During Therapy: Flat affect Overall Cognitive Status: History of cognitive impairments - at baseline                                 General Comments: Oriented to self, hospital, date, generally to situation, follows simple commands with cues, hyperverbal and easily distracted but also easily redirected   General Comments  RR 20's-40's with exertion, O2 sats dropped slightly to mid 80's only briefly; generally in the low to high 90's on 6L HHFNC; cues for PLB    Exercises Other Exercises Other Exercises: Dynamic reaching while seated EOB requiring SBA to CGA and no LOB to perform, VSS throughout Other Exercises: STS transfer training requiring RW and +2 assist (see mobility for additional detail) Other Exercises: bed level toileting   Shoulder Instructions      Home Living Family/patient expects to be discharged to:: Private residence Living Arrangements: Spouse/significant other Available Help at Discharge: Family Type of Home: House       Home Layout: One level               Home Equipment: Cane - single point          Prior  Functioning/Environment Level of Independence: Independent with assistive device(s)        Comments: Mod indep with SPC for household distances; no home O2.        OT Problem List: Decreased strength;Decreased cognition;Increased edema;Cardiopulmonary status limiting activity;Decreased safety awareness;Decreased activity tolerance;Obesity;Decreased knowledge of use of DME or AE;Impaired balance (sitting and/or standing)      OT Treatment/Interventions: Self-care/ADL training;Therapeutic exercise;Therapeutic activities;Cognitive remediation/compensation;DME and/or AE instruction;Patient/family education;Balance training;Energy conservation    OT Goals(Current goals can be found in the care plan section) Acute Rehab OT Goals Patient Stated Goal: get back to being strong like I used to be OT Goal Formulation: With patient/family Time For Goal Achievement: 10/21/20 Potential to Achieve Goals: Good ADL Goals Pt Will Perform Lower Body Dressing: with min assist;sit to/from stand Pt Will Transfer to Toilet: with min assist;bedside commode;ambulating;with +2 assist (LRAD for amb) Pt Will Perform Toileting - Clothing Manipulation and hygiene: sit to/from stand;with set-up;with min guard assist;with 2+ total assist  OT Frequency: Min 3X/week   Barriers to D/C:  Co-evaluation PT/OT/SLP Co-Evaluation/Treatment: Yes Reason for Co-Treatment: For patient/therapist safety;To address functional/ADL transfers PT goals addressed during session: Mobility/safety with mobility;Balance;Proper use of DME OT goals addressed during session: ADL's and self-care;Proper use of Adaptive equipment and DME      AM-PAC OT "6 Clicks" Daily Activity     Outcome Measure Help from another person eating meals?: A Little Help from another person taking care of personal grooming?: A Little Help from another person toileting, which includes using toliet, bedpan, or urinal?: A Lot Help from another person  bathing (including washing, rinsing, drying)?: A Lot Help from another person to put on and taking off regular upper body clothing?: A Lot Help from another person to put on and taking off regular lower body clothing?: A Lot 6 Click Score: 14   End of Session Equipment Utilized During Treatment: Rolling walker;Gait belt;Oxygen Nurse Communication: Mobility status  Activity Tolerance: Patient tolerated treatment well Patient left: in bed;with call bell/phone within reach;with bed alarm set;with nursing/sitter in room;with family/visitor present  OT Visit Diagnosis: Other abnormalities of gait and mobility (R26.89);Muscle weakness (generalized) (M62.81);Other symptoms and signs involving cognitive function                Time: 1500-1553 OT Time Calculation (min): 53 min Charges:  OT General Charges $OT Visit: 1 Visit OT Evaluation $OT Eval High Complexity: 1 High OT Treatments $Self Care/Home Management : 8-22 mins $Therapeutic Activity: 8-22 mins  Jeni Salles, MPH, MS, OTR/L ascom [phone] 10/07/20, 4:36 PM

## 2020-10-07 NOTE — Progress Notes (Signed)
Bipap refused by pt

## 2020-10-07 NOTE — Progress Notes (Signed)
PHARMACY CONSULT NOTE - FOLLOW UP  Pharmacy Consult for Electrolyte Monitoring and Replacement   Recent Labs: Potassium (mmol/L)  Date Value  10/07/2020 3.2 (L)  05/04/2014 4.3   Magnesium (mg/dL)  Date Value  10/07/2020 2.1   Calcium (mg/dL)  Date Value  10/07/2020 8.8 (L)   Calcium, Total (mg/dL)  Date Value  05/04/2014 9.1   Albumin (g/dL)  Date Value  10/07/2020 2.7 (L)   Phosphorus (mg/dL)  Date Value  10/07/2020 2.7   Sodium (mmol/L)  Date Value  10/07/2020 146 (H)  05/04/2014 140     Assessment: 60 year old male presented with AMS. Patient has been on diuresis with electrolyte abnormalities. Pharmacy to manage electrolytes.  Goal of Therapy:  Electrolytes WNL  Plan:  Na 146>>150>152> 146. May be related to diuresis and decreased oral intake. Lasix discontinued 1/6. Patient working with speech 1/7 per RN, so will hopefully be able to increase oral water intake. Continue to follow closely.   Hypokalemia persistent despite replacement. Restarting Entresto which may also increase potassium as well. Medical team ordered KCl 10 mEq x 6 runs.   Follow up electrolytes with morning labs.  Oswald Hillock ,PharmD Clinical Pharmacist 10/07/2020 8:50 AM
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
## 2020-10-07 NOTE — Progress Notes (Signed)
Inpatient Rehab Admissions Coordinator Note:   Per PT/OT recommendations, pt was screened for CIR candidacy by Gayland Curry, MS, CCC-SLP.  At this time we are recommending an inpatient rehab consult.  AC will place consult order per protocol. Please contact me with questions.     Gayland Curry, [employer], [place] Admissions Coordinator [phone] 10/07/20 5:32 PM

## 2020-10-07 NOTE — Progress Notes (Addendum)
Physical Therapy Treatment Patient Details Name: Eddie Chapman MRN: [id number] DOB: 07-07-1961 Today's Date: 10/07/2020    History of Present Illness 60yo male presented to ER after found unresponsive, severely hypoxemic; admitted for management of acute hypoxic respiratory failure, septic shock due to consolidated L lung PNA.  Intubated 12/23-1/4; weaned to [place] at this time.    PT Comments    PT /OT co-treat 2/2 to +2 assist required for safety. Pt very motivated and cooperative throughout however easily distracted. Needs cueing to stop talking to focus on desired task.  +2 assist to exit L side of bed. On 6 L HFNC throughout session. Pt was able to stand EOB 1 x however upon standing has BM and needed +2 assist to return to bed and perform hygiene care. Overall pt has  progressed since previous session. Will continue to progress and follow per POC. Pt would greatly benefit from CIR at DC to address deficits while assisting pt to PLOF.    Follow Up Recommendations  CIR;Other (comment) (Pt independent PTA and very motivated/cooperative with therapy)     Equipment Recommendations  Other (comment) (defer to next level of care)    Recommendations for Other Services       Precautions / Restrictions Precautions Precautions: Fall Restrictions Weight Bearing Restrictions: No    Mobility  Bed Mobility Overal bed mobility: Needs Assistance Bed Mobility: Supine to Sit;Sit to Supine;Rolling Rolling: +2 for safety/equipment;+2 for physical assistance;Min assist   Supine to sit: Min assist;+2 for physical assistance Sit to supine: Total assist;+2 for physical assistance   General bed mobility comments: Pt was at FOB when time came to return to supine. removed footplate and +2 total assist to return to supine and reposition to [facility].  Transfers Overall transfer level: Needs assistance Equipment used: Rolling walker (2 wheeled) Transfers: Sit to/from Stand Sit to Stand: Min assist;Mod  assist;+2 physical assistance         General transfer comment: pt was able to stand fairly easy with +2 assistance. limited standing time 2/2 to pt haveing BM upon standing.  Ambulation/Gait             General Gait Details: unable due to BM upon standing however demonstrated goos strength/coordination. will progress to ambulation next session.     Balance Overall balance assessment: Needs assistance Sitting-balance support: No upper extremity supported;Feet supported;Single extremity supported Sitting balance-Leahy Scale: Fair Sitting balance - Comments: initially requiring Min A +1 for static sitting, improving to SBA and able to perform dynamic reaching in all planes without LOB   Standing balance support: Bilateral upper extremity supported Standing balance-Leahy Scale: Poor Standing balance comment: limited standing time...will reassess next session        Cognition Arousal/Alertness: Awake/alert Behavior During Therapy:  (Talkative) Overall Cognitive Status: History of cognitive impairments - at baseline        General Comments: Pt extremely cooperative however very talkative and needs to be redirected to stay focused on desired task throughout. +2 for safety. session progression limited by pt having BM upon standing. needed total assistto clean/perform hygene care afterwards      Exercises Other Exercises Other Exercises: Dynamic reaching while seated EOB requiring SBA to CGA and no LOB to perform, VSS throughout Other Exercises: STS transfer training requiring RW and +2 assist (see mobility for additional detail) Other Exercises: bed level toileting    General Comments General comments (skin integrity, edema, etc.): assisted pt with complete bed linen change and hygene care after  BM.      Pertinent Vitals/Pain Pain Assessment: No/denies pain    Home Living Family/patient expects to be discharged to:: Private residence Living Arrangements:  Spouse/significant other Available Help at Discharge: Family Type of Home: House     Home Layout: One level Home Equipment: Kasandra Knudsen - single point      Prior Function Level of Independence: Independent with assistive device(s)      Comments: Mod indep with SPC for household distances; no home O2.   PT Goals (current goals can now be found in the care plan section) Acute Rehab PT Goals Patient Stated Goal: return to PLOF Progress towards PT goals: Progressing toward goals    Frequency    Min 2X/week      PT Plan Current plan remains appropriate    Co-evaluation   Reason for Co-Treatment: Complexity of the patient's impairments (multi-system involvement);Necessary to address cognition/behavior during functional activity;To address functional/ADL transfers;For patient/therapist safety PT goals addressed during session: Mobility/safety with mobility;Balance;Strengthening/ROM;Proper use of DME OT goals addressed during session: ADL's and self-care;Proper use of Adaptive equipment and DME      AM-PAC PT "6 Clicks" Mobility   Outcome Measure  Help needed turning from your back to your side while in a flat bed without using bedrails?: Total Help needed moving from lying on your back to sitting on the side of a flat bed without using bedrails?: A Lot Help needed moving to and from a bed to a chair (including a wheelchair)?: A Lot Help needed standing up from a chair using your arms (e.g., wheelchair or bedside chair)?: A Lot Help needed to walk in hospital room?: A Lot Help needed climbing 3-5 steps with a railing? : Total 6 Click Score: 10    End of Session Equipment Utilized During Treatment: Gait belt Activity Tolerance: Patient tolerated treatment well Patient left: in bed;with call bell/phone within reach;with bed alarm set Nurse Communication: Mobility status PT Visit Diagnosis: Muscle weakness (generalized) (M62.81);Difficulty in walking, not elsewhere classified  (R26.2)     Time: 9371-6967 PT Time Calculation (min) (ACUTE ONLY): 53 min  Charges:  $Therapeutic Activity: 23-37 mins                     Julaine Fusi PTA 10/07/20, 5:04 PM

## 2020-10-07 NOTE — Progress Notes (Signed)
PROGRESS NOTE    Eddie Chapman  NWG:[id number] DOB: 1960-12-08 DOA: 09/21/2020 PCP: Adin Hector, MD     Brief Narrative:  60 yo WM PMHx Cerrebral aneurysm, PVD, Chronic Diastolic CHF , Pulmonary HTN, Morbid Obesity, OSA, GERD, Hx of TBI, dyslipidemia   who came in after girlfriend found him to be unresponsive.  He was found to be severely hypoxemic in circulatory shock with left lung infiltrate on CXR. Labwork consistent with sepsis.   Subjective: Afebrile overnight, A/O x4 but patient's thoughts wander and he becomes easily confused.  Patient's significant other at bedside and able to fill in the errors.   Assessment & Plan: Covid vaccination; vaccinated 3/3   Active Problems:   Septic shock ([place])   Acute respiratory failure with hypoxia and hypercapnia (HCC)   Pneumonia involving left lung   OSA (obstructive sleep apnea)   Obesity hypoventilation syndrome (HCC)   Acute metabolic encephalopathy   Acute diastolic CHF (congestive heart failure) ([place])   AKI (acute kidney injury) ([place])   Morbid obesity with BMI of 40.0-44.9, adult ([place])  Septic shock  Acute respiratory failure with hypoxia and hypercapnia/LEFT lung pneumonia -At high risk for reintubation  OSA/OHS  Acute metabolic encephalopathy -Most likely secondary to critical illness  Acute diastolic CHF - Strict in and out - Daily weight  AKI due to ATN   Morbid obesity with BMI of 40-44.9    Hypokalemia - Potassium goal> 4 - Potassium IV 60 mEq     DVT prophylaxis: SCD Code Status: Full Family Communication: 1/8 significant other at bedside for discussion of plan of care answered all questions Status is: Inpatient    Dispo: The patient is from: Home              Anticipated d/c is to: Home              Anticipated d/c date is: 1/18              Patient currently unstable      Consultants:  PCCM   Procedures/Significant Events:  09/22/20- patient is off vasopressors. He is  weaning on FIO2. Family at bedside (girlfriend). Patient is weaned to 40% FIO2. 09/23/20-patient failed SBT today. 09/25/20-patient with continued tenacious secretions and patient also with altered mentation. Started Precedex in hopes to be able to wean Versed. As patient extremely altered and difficult to arouse. CT head and CT chest obtained 09/26/20-Pateint following commands intermittently but agitated.  SBT attempted this morning and failed. Patient was taking TV of 200 on 10/5. RR increased to 40. Placed back on rate and sedaiton increased.  09/27/2020-patient attempted another spontaneous breathing trial. Patient becomes extremely agitated. Patient takes shallow breaths and has high respiratory rate. 12/30-remains heavily sedated 09/29/2020:Poorly responsive when sedation is lightened, does not tolerate SBT 10/01/2019:Not tolerating SBT, will need goals of care addressed 10/02/2019:More responsive, still unable to tolerate SBT well 10/03/19- failed SBT today, palliative care evaluation in process 10/04/19- patient passed SBT and was extubated to BIPAP. I spoke with patients father and met with GF at bedside for a second update. 1/5 remains extubated, high risk for intubation 1/6 remains on high flow high risk for intubation  I have personally reviewed and interpreted all radiology studies and my findings are as above.  VENTILATOR SETTINGS: HFNC 1/8 Flow 6 L/min SPO2 97%   Cultures 12/23 SARS coronavirus negative 12/23 influenza A/B negative 12/24 HIV screen negative   Antimicrobials: Anti-infectives (From admission, onward)   Start  Dose/Rate Route Frequency Ordered Stop   10/05/20 0600  vancomycin (VANCOREADY) IVPB 1500 mg/300 mL  Status:  Discontinued        1,500 mg 150 mL/hr over 120 Minutes Intravenous Every 12 hours 10/04/20 1734 10/06/20 1014   10/04/20 2200  meropenem (MERREM) 1 g in sodium chloride 0.9 % 100 mL IVPB        1 g 200 mL/hr over 30 Minutes  Intravenous Every 8 hours 10/04/20 1730 10/11/20 2159   10/04/20 2015  vancomycin (VANCOREADY) IVPB 500 mg/100 mL       "Followed by" Linked Group Details   500 mg 100 mL/hr over 60 Minutes Intravenous  Once 10/04/20 1730 10/04/20 2133   10/04/20 1815  vancomycin (VANCOREADY) IVPB 2000 mg/400 mL       "Followed by" Linked Group Details   2,000 mg 200 mL/hr over 120 Minutes Intravenous  Once 10/04/20 1730 10/05/20 0417   09/21/20 1845  piperacillin-tazobactam (ZOSYN) IVPB 3.375 g  Status:  Discontinued        3.375 g 12.5 mL/hr over 240 Minutes Intravenous Every 8 hours 09/21/20 1805 09/28/20 1008   09/21/20 1530  vancomycin (VANCOREADY) IVPB 1500 mg/300 mL        1,500 mg 150 mL/hr over 120 Minutes Intravenous  Once 09/21/20 1425 09/21/20 2051   09/21/20 1430  ceFEPIme (MAXIPIME) 2 g in sodium chloride 0.9 % 100 mL IVPB        2 g 200 mL/hr over 30 Minutes Intravenous  Once 09/21/20 1416 09/21/20 1606   09/21/20 1430  vancomycin (VANCOCIN) IVPB 1000 mg/200 mL premix        1,000 mg 200 mL/hr over 60 Minutes Intravenous  Once 09/21/20 1416 09/21/20 1714   09/21/20 1430  vancomycin (VANCOREADY) IVPB 1500 mg/300 mL  Status:  Discontinued        1,500 mg 150 mL/hr over 120 Minutes Intravenous  Once 09/21/20 1425 09/21/20 1425       Devices    LINES / TUBES:      Continuous Infusions: . sodium chloride 250 mL (10/04/20 1030)  . meropenem (MERREM) IV 1 g (10/07/20 1647)  . potassium chloride       Objective: Vitals:   10/07/20 1300 10/07/20 1400 10/07/20 1500 10/07/20 1554  BP: 123/85 (!) 123/91 (!) 148/81   Pulse: (!) 102 (!) 103 (!) 103   Resp: (!) 24 (!) 28 (!) 35   Temp:      TempSrc:      SpO2: 93% 96% 95% 95%  Weight:      Height:        Intake/Output Summary (Last 24 hours) at 10/07/2020 1747 Last data filed at 10/07/2020 1358 Gross per 24 hour  Intake 1160 ml  Output -  Net 1160 ml   Filed Weights   10/05/20 0225 10/06/20 0235 10/07/20 0500  Weight:  135.1 kg 136 kg (!) 138.4 kg    Examination:  General: A/O x4, but easily confused and goes off subject, positive acute respiratory distress Eyes: negative scleral hemorrhage, negative anisocoria, negative icterus ENT: Negative Runny nose, negative gingival bleeding, Neck:  Negative scars, masses, torticollis, lymphadenopathy, JVD Lungs: decreased breath sounds, diffuse coarse breath sounds bilaterally without wheezes or crackles Cardiovascular: Regular rate and rhythm without murmur gallop or rub normal S1 and S2 Abdomen: MORBIDLY OBESE, negative abdominal pain, nondistended, positive soft, bowel sounds, no rebound, no ascites, no appreciable mass Extremities: No significant cyanosis, clubbing, or edema bilateral lower extremities Skin: Negative  rashes, lesions, ulcers Psychiatric:  Negative depression, negative anxiety, negative fatigue, negative mania  Central nervous system:  Cranial nerves II through XII intact, tongue/uvula midline, all extremities muscle strength 5/5, sensation intact throughout, negative dysarthria, negative expressive aphasia, negative receptive aphasia.  .     Data Reviewed: Care during the described time interval was provided by me .  I have reviewed this patient's available data, including medical history, events of note, physical examination, and all test results as part of my evaluation.  CBC: Recent Labs  Lab 10/03/20 0500 10/04/20 0500 10/05/20 0720 10/06/20 0553 10/07/20 0711  WBC 7.7 16.2* 11.0* 9.9 9.2  NEUTROABS 6.0 13.9* 8.6* 7.9* 7.3  HGB 12.6* 15.6 16.0 16.3 16.0  HCT 42.1 50.0 52.2* 53.4* 51.8  MCV 100.5* 97.8 96.7 98.7 98.3  PLT 153 217 242 216 774   Basic Metabolic Panel: Recent Labs  Lab 10/03/20 0500 10/04/20 0500 10/05/20 0720 10/06/20 0553 10/07/20 0711  NA 146* 149* 150* 152* 146*  K 3.6 3.3* 2.7* 2.6* 3.2*  CL 103 97* 101 104 102  CO2 36* 41* 39* 37* 36*  GLUCOSE 122* 123* 138* 117* 116*  BUN 25* 35* 31* 31* 24*   CREATININE 0.48* 0.74 0.74 0.62 0.48*  CALCIUM 7.4* 9.4 9.2 9.3 8.8*  MG 1.7 2.1 2.0 2.2 2.1  PHOS 2.6 3.1 3.0 3.2 2.7   GFR: Estimated Creatinine Clearance: 137.8 mL/min (A) (by C-G formula based on SCr of 0.48 mg/dL (L)). Liver Function Tests: Recent Labs  Lab 10/03/20 0500 10/04/20 0500 10/05/20 0720 10/06/20 0553 10/07/20 0711  ALBUMIN 2.1* 3.0* 2.9* 2.9* 2.7*   No results for input(s): LIPASE, AMYLASE in the last 168 hours. No results for input(s): AMMONIA in the last 168 hours. Coagulation Profile: No results for input(s): INR, PROTIME in the last 168 hours. Cardiac Enzymes: No results for input(s): CKTOTAL, CKMB, CKMBINDEX, TROPONINI in the last 168 hours. BNP (last 3 results) No results for input(s): PROBNP in the last 8760 hours. HbA1C: No results for input(s): HGBA1C in the last 72 hours. CBG: Recent Labs  Lab 10/06/20 1942 10/06/20 2252 10/07/20 0737 10/07/20 1141 10/07/20 1607  GLUCAP 132* 125* 118* 161* 144*   Lipid Profile: No results for input(s): CHOL, HDL, LDLCALC, TRIG, CHOLHDL, LDLDIRECT in the last 72 hours. Thyroid Function Tests: No results for input(s): TSH, T4TOTAL, FREET4, T3FREE, THYROIDAB in the last 72 hours. Anemia Panel: No results for input(s): VITAMINB12, FOLATE, FERRITIN, TIBC, IRON, RETICCTPCT in the last 72 hours. Sepsis Labs: No results for input(s): PROCALCITON, LATICACIDVEN in the last 168 hours.  No results found for this or any previous visit (from the past 240 hour(s)).       Radiology Studies: DG Chest Port 1 View  Result Date: 10/07/2020 CLINICAL DATA:  Acute respiratory failure with hypoxia EXAM: PORTABLE CHEST 1 VIEW COMPARISON:  Radiograph 10/02/2020, CT 09/25/2020 FINDINGS: Low lung volumes and basilar atelectatic changes. Some pulmonary vascular congestion and cuffing is noted with fissural thickening. Suspect some septal lines as well. There are persistent bilateral effusions including fluid within the fissure on  the left. No pneumothorax. Stable cardiomediastinal contours. Removal of the previously seen endotracheal and transesophageal tube. Nasal cannula, telemetry leads overlie the chest. No acute osseous or soft tissue abnormality. Degenerative changes are present in the imaged spine and shoulders. IMPRESSION: 1. Low lung volumes with basilar atelectatic changes. 2. Pulmonary vascular congestion with cuffing which could reflect a degree of interstitial edema. 3. Persistent though slightly diminished bilateral effusions. Electronically  Signed   By: Lovena Le M.D.   On: 10/07/2020 02:59        Scheduled Meds: . chlorhexidine gluconate (MEDLINE KIT)  15 mL Mouth Rinse BID  . Chlorhexidine Gluconate Cloth  6 each Topical Daily  . docusate  200 mg Per Tube BID  . enoxaparin (LOVENOX) injection  0.5 mg/kg Subcutaneous Q24H  . feeding supplement  237 mL Oral BID BM  . insulin aspart  0-15 Units Subcutaneous TID WC  . insulin aspart  0-5 Units Subcutaneous QHS  . ipratropium-albuterol  3 mL Nebulization Q6H  . metoprolol succinate  100 mg Oral Daily  . multivitamin with minerals  1 tablet Oral Daily  . pantoprazole sodium  40 mg Per Tube Daily  . polyethylene glycol  17 g Per Tube Daily  . sacubitril-valsartan  1 tablet Oral BID  . sodium chloride flush  10-40 mL Intracatheter Q12H   Continuous Infusions: . sodium chloride 250 mL (10/04/20 1030)  . meropenem (MERREM) IV 1 g (10/07/20 1647)  . potassium chloride       LOS: 16 days    Time spent:40 min    Makenley Shimp, Geraldo Docker, MD [facility] Pager [phone]  If 7PM-7AM, please contact night-coverage [email or web address] Password [facility] 10/07/2020, 5:47 PM

## 2020-10-08 ENCOUNTER — Encounter: Payer: Self-pay | Admitting: Pulmonary Disease

## 2020-10-08 ENCOUNTER — Inpatient Hospital Stay: Payer: PPO

## 2020-10-08 DIAGNOSIS — N179 Acute kidney failure, unspecified: Secondary | ICD-10-CM | POA: Diagnosis not present

## 2020-10-08 DIAGNOSIS — I5031 Acute diastolic (congestive) heart failure: Secondary | ICD-10-CM | POA: Diagnosis not present

## 2020-10-08 DIAGNOSIS — I5022 Chronic systolic (congestive) heart failure: Secondary | ICD-10-CM

## 2020-10-08 DIAGNOSIS — J9601 Acute respiratory failure with hypoxia: Secondary | ICD-10-CM | POA: Diagnosis not present

## 2020-10-08 DIAGNOSIS — G9341 Metabolic encephalopathy: Secondary | ICD-10-CM | POA: Diagnosis not present

## 2020-10-08 LAB — COMPREHENSIVE METABOLIC PANEL
ALT: 56 U/L — ABNORMAL HIGH (ref 0–44)
AST: 35 U/L (ref 15–41)
Albumin: 2.9 g/dL — ABNORMAL LOW (ref 3.5–5.0)
Alkaline Phosphatase: 39 U/L (ref 38–126)
Anion gap: 7 (ref 5–15)
BUN: 21 mg/dL (ref 8–23)
CO2: 36 mmol/L — ABNORMAL HIGH (ref 22–32)
Calcium: 8.8 mg/dL — ABNORMAL LOW (ref 8.9–10.3)
Chloride: 101 mmol/L (ref 98–111)
Creatinine, Ser: 0.4 mg/dL — ABNORMAL LOW (ref 0.61–1.24)
GFR, Estimated: >60 mL/min (ref >60)
Glucose, Bld: 119 mg/dL — ABNORMAL HIGH (ref 70–99)
Potassium: 3.2 mmol/L — ABNORMAL LOW (ref 3.5–5.1)
Sodium: 144 mmol/L (ref 135–145)
Total Bilirubin: 0.8 mg/dL (ref 0.3–1.2)
Total Protein: 6.8 g/dL (ref 6.5–8.1)

## 2020-10-08 LAB — CBC WITH DIFFERENTIAL/PLATELET
Abs Immature Granulocytes: 0.03 10*3/uL (ref 0.00–0.07)
Basophils Absolute: 0 10*3/uL (ref 0.0–0.1)
Basophils Relative: 0 %
Eosinophils Absolute: 0.1 10*3/uL (ref 0.0–0.5)
Eosinophils Relative: 1 %
HCT: 52.2 % — ABNORMAL HIGH (ref 39.0–52.0)
Hemoglobin: 15.8 g/dL (ref 13.0–17.0)
Immature Granulocytes: 0 %
Lymphocytes Relative: 12 %
Lymphs Abs: 1 10*3/uL (ref 0.7–4.0)
MCH: 29.8 pg (ref 26.0–34.0)
MCHC: 30.3 g/dL (ref 30.0–36.0)
MCV: 98.3 fL (ref 80.0–100.0)
Monocytes Absolute: 0.8 10*3/uL (ref 0.1–1.0)
Monocytes Relative: 10 %
Neutro Abs: 6.4 10*3/uL (ref 1.7–7.7)
Neutrophils Relative %: 77 %
Platelets: 200 10*3/uL (ref 150–400)
RBC: 5.31 MIL/uL (ref 4.22–5.81)
RDW: 13 % (ref 11.5–15.5)
WBC: 8.4 10*3/uL (ref 4.0–10.5)
nRBC: 0 % (ref 0.0–0.2)

## 2020-10-08 LAB — PHOSPHORUS: Phosphorus: 2.6 mg/dL (ref 2.5–4.6)

## 2020-10-08 LAB — GLUCOSE, CAPILLARY
Glucose-Capillary: 105 mg/dL — ABNORMAL HIGH (ref 70–99)
Glucose-Capillary: 123 mg/dL — ABNORMAL HIGH (ref 70–99)
Glucose-Capillary: 119 mg/dL — ABNORMAL HIGH (ref 70–99)
Glucose-Capillary: 125 mg/dL — ABNORMAL HIGH (ref 70–99)

## 2020-10-08 LAB — LACTIC ACID, PLASMA: Lactic Acid, Venous: 0.9 mmol/L (ref 0.5–1.9)

## 2020-10-08 LAB — MAGNESIUM
Magnesium: 2 mg/dL (ref 1.7–2.4)
Magnesium: 2.6 mg/dL — ABNORMAL HIGH (ref 1.7–2.4)

## 2020-10-08 LAB — POTASSIUM: Potassium: 4.3 mmol/L (ref 3.5–5.1)

## 2020-10-08 MED ORDER — METOPROLOL SUCCINATE ER 50 MG PO TB24
125.0000 mg | ORAL_TABLET | Freq: Every day | ORAL | Status: DC
Start: 2020-10-09 — End: 2020-10-10
  Administered 2020-10-09: 125 mg via ORAL
  Filled 2020-10-08: qty 1

## 2020-10-08 MED ORDER — POTASSIUM CHLORIDE 20 MEQ PO PACK: 40.0000 meq | PACK | Freq: Once | ORAL | Status: DC

## 2020-10-08 MED ORDER — POTASSIUM CHLORIDE 20 MEQ PO PACK
40.0000 meq | PACK | ORAL | Status: DC
  Administered 2020-10-08: 40 meq via ORAL
  Filled 2020-10-08: qty 2

## 2020-10-08 MED ORDER — IPRATROPIUM-ALBUTEROL 0.5-2.5 (3) MG/3ML IN SOLN
3.0000 mL | Freq: Four times a day (QID) | RESPIRATORY_TRACT | Status: DC
  Administered 2020-10-08 – 2020-10-11 (×10): 3 mL via RESPIRATORY_TRACT
  Filled 2020-10-08 (×10): qty 3

## 2020-10-08 MED ORDER — FUROSEMIDE 10 MG/ML IJ SOLN
60.0000 mg | Freq: Once | INTRAMUSCULAR | Status: AC
  Administered 2020-10-08: 60 mg via INTRAVENOUS
  Filled 2020-10-08: qty 6

## 2020-10-08 MED ORDER — PANTOPRAZOLE SODIUM 40 MG PO PACK
40.0000 mg | PACK | Freq: Every day | ORAL | Status: DC
  Administered 2020-10-08 – 2020-10-09 (×2): 40 mg via ORAL
  Filled 2020-10-08 (×3): qty 20

## 2020-10-08 MED ORDER — POTASSIUM CHLORIDE 10 MEQ/100ML IV SOLN
10.0000 meq | INTRAVENOUS | Status: AC
Start: 2020-10-08 — End: 2020-10-08
  Administered 2020-10-08 (×5): 10 meq via INTRAVENOUS
  Filled 2020-10-08 (×6): qty 100

## 2020-10-08 NOTE — Progress Notes (Addendum)
PHARMACY CONSULT NOTE - FOLLOW UP  Pharmacy Consult for Electrolyte Monitoring and Replacement   Recent Labs: Potassium (mmol/L)  Date Value  10/08/2020 3.2 (L)  05/04/2014 4.3   Magnesium (mg/dL)  Date Value  10/08/2020 2.0   Calcium (mg/dL)  Date Value  10/08/2020 8.8 (L)   Calcium, Total (mg/dL)  Date Value  05/04/2014 9.1   Albumin (g/dL)  Date Value  10/08/2020 2.9 (L)   Phosphorus (mg/dL)  Date Value  10/08/2020 2.6   Sodium (mmol/L)  Date Value  10/08/2020 144  05/04/2014 140     Assessment: 60 year old male presented with AMS. Patient has been on diuresis with electrolyte abnormalities. Pharmacy to manage electrolytes.  Goal of Therapy:  Electrolytes WNL  Plan:  Na 146>>150>152> 146 > 144. May be related to diuresis and decreased oral intake. Lasix discontinued 1/6. Patient working with speech 1/7 per RN, so will hopefully be able to increase oral water intake. Continue to follow closely.   Hypokalemia persistent despite replacement. Restarting Entresto which may also increase potassium as well. Medical team ordered KCl 10 mEq x 6 runs on 1/8 only 2 doses given. 1/9 KCl 10 mEq IV x 2. Will give KCl 40 mEq PO x 2 today.    Follow up electrolytes with morning labs.  Eddie Chapman ,PharmD, BCPS Clinical Pharmacist 10/08/2020 8:11 AM

## 2020-10-08 NOTE — Progress Notes (Signed)
PROGRESS NOTE    Eddie Chapman  QRF:[id number] DOB: 1961/09/12 DOA: 09/21/2020 PCP: Adin Hector, MD     Brief Narrative:  60 yo WM PMHx Cerrebral aneurysm, PVD, Chronic Diastolic CHF , Pulmonary HTN, Morbid Obesity, OSA, GERD, Hx of TBI, dyslipidemia   who came in after girlfriend found him to be unresponsive.  He was found to be severely hypoxemic in circulatory shock with left lung infiltrate on CXR. Labwork consistent with sepsis.   Subjective: 1/9 afebrile overnight A/O x4 but patient thoughts still continue to wander.  Follows all commands answers all questions appropriately   Assessment & Plan: Covid vaccination; vaccinated 3/3   Active Problems:   Septic shock ([facility])   Acute respiratory failure with hypoxia and hypercapnia (HCC)   Pneumonia involving left lung   OSA (obstructive sleep apnea)   Obesity hypoventilation syndrome (HCC)   Acute metabolic encephalopathy   Acute diastolic CHF (congestive heart failure) ([place])   AKI (acute kidney injury) ([place])   Morbid obesity with BMI of 40.0-44.9, adult ([place])  Septic shock -on admission RR> 20,HR> 90, organism of infection lungs, lactic acid> 2.  Started on vasopressors -09/21/2020 patient was intubated.----> 10/03/2020 extubated  Acute respiratory failure with hypoxia and hypercapnia/LEFT lung pneumonia -At high risk for reintubation  -Patient continues require O2 support. - Titrate O2 to maintain SPO2> 92% -Flutter valve - Incentive spirometry - DuoNeb -Out of bed to chair q shift -Complete course of antibiotics  OSA/OHS -CPAP per respiratory  Acute metabolic encephalopathy -Most likely secondary to critical illness  Acute Systolic CHF - 82/64 echocardiogram shows mildly reduced EF, see results below - Strict in and out -6.7 L - Daily weight Filed Weights   10/06/20 0235 10/07/20 0500 10/08/20 0300  Weight: 136 kg (!) 138.4 kg (!) 139.3 kg  -1/9 increase Toprol 125 mg daily -1 /9 Lasix IV 60 mg x  1  Essential HTN - See acute CHF  AKI due to ATN Lab Results  Component Value Date   CREATININE 0.40 (L) 10/08/2020   CREATININE 0.48 (L) 10/07/2020   CREATININE 0.62 10/06/2020   CREATININE 0.74 10/05/2020   CREATININE 0.74 10/04/2020  -Resolved  Morbid obesity with BMI of 40-44.9    Hypokalemia - Potassium goal> 4 - 1/9 potassium IV 60 mEq -1/9 recheck K/Mg '@1500'      DVT prophylaxis: SCD Code Status: Full Family Communication: 1/9 significant other(girlfriend) at bedside for discussion of plan of care answered all questions Status is: Inpatient    Dispo: The patient is from: Home              Anticipated d/c is to: Home              Anticipated d/c date is: 1/18              Patient currently unstable      Consultants:  PCCM   Procedures/Significant Events:  12/24 echocardiogram;LVEF= 50 to 55%. No regional wall motion abnormalities. Pulmonic Valve: The pulmonic valve was normal in structure. Pulmonic valve  regurgitation is not visualized.  09/22/20- patient is off vasopressors. He is weaning on FIO2. Family at bedside (girlfriend). Patient is weaned to 40% FIO2. 09/23/20-patient failed SBT today. 09/25/20-patient with continued tenacious secretions and patient also with altered mentation. Started Precedex in hopes to be able to wean Versed. As patient extremely altered and difficult to arouse. CT head and CT chest obtained 09/26/20-Pateint following commands intermittently but agitated.  SBT attempted this morning and  failed. Patient was taking TV of 200 on 10/5. RR increased to 40. Placed back on rate and sedaiton increased.  09/27/2020-patient attempted another spontaneous breathing trial. Patient becomes extremely agitated. Patient takes shallow breaths and has high respiratory rate. 12/30-remains heavily sedated 09/29/2020:Poorly responsive when sedation is lightened, does not tolerate SBT 10/01/2019:Not tolerating SBT, will need  goals of care addressed 10/02/2019:More responsive, still unable to tolerate SBT well 10/03/19- failed SBT today, palliative care evaluation in process 10/04/19- patient passed SBT and was extubated to BIPAP. I spoke with patients father and met with GF at bedside for a second update. 1/5 remains extubated, high risk for intubation 1/6 remains on high flow high risk for intubation  I have personally reviewed and interpreted all radiology studies and my findings are as above.  VENTILATOR SETTINGS: Nasal cannula 1/9  Flow 6 L/min SPO2 97%   Cultures 12/23 SARS coronavirus negative 12/23 influenza A/B negative 12/24 HIV screen negative 12/28 blood positive staph epidermidis 1/4 bottles considered contaminant    Antimicrobials: Anti-infectives (From admission, onward)   Start     Ordered Stop   10/05/20 0600  vancomycin (VANCOREADY) IVPB 1500 mg/300 mL  Status:  Discontinued        10/04/20 1734 10/06/20 1014   10/04/20 2200  meropenem (MERREM) 1 g in sodium chloride 0.9 % 100 mL IVPB        10/04/20 1730 10/11/20 2159   10/04/20 2015  vancomycin (VANCOREADY) IVPB 500 mg/100 mL       "Followed by" Linked Group Details   10/04/20 1730 10/04/20 2133   10/04/20 1815  vancomycin (VANCOREADY) IVPB 2000 mg/400 mL       "Followed by" Linked Group Details   10/04/20 1730 10/05/20 0417   09/21/20 1845  piperacillin-tazobactam (ZOSYN) IVPB 3.375 g  Status:  Discontinued        09/21/20 1805 09/28/20 1008   09/21/20 1530  vancomycin (VANCOREADY) IVPB 1500 mg/300 mL        09/21/20 1425 09/21/20 2051   09/21/20 1430  ceFEPIme (MAXIPIME) 2 g in sodium chloride 0.9 % 100 mL IVPB        09/21/20 1416 09/21/20 1606   09/21/20 1430  vancomycin (VANCOCIN) IVPB 1000 mg/200 mL premix        09/21/20 1416 09/21/20 1714   09/21/20 1430  vancomycin (VANCOREADY) IVPB 1500 mg/300 mL  Status:  Discontinued        09/21/20 1425 09/21/20 1425       Devices    LINES / TUBES:      Continuous  Infusions: . sodium chloride 250 mL (10/08/20 0110)  . meropenem (MERREM) IV 1 g (10/08/20 0612)     Objective: Vitals:   10/07/20 2141 10/08/20 0300 10/08/20 0303 10/08/20 0810  BP: (!) 142/97  (!) 144/97 (!) 153/101  Pulse: 96  93 (!) 103  Resp: '20  18 18  ' Temp: 98.7 F (37.1 C)  98.4 F (36.9 C) 98.6 F (37 C)  TempSrc: Oral  Oral Oral  SpO2: 97%  94% 97%  Weight:  (!) 139.3 kg    Height:        Intake/Output Summary (Last 24 hours) at 10/08/2020 1135 Last data filed at 10/07/2020 1358 Gross per 24 hour  Intake 480 ml  Output -  Net 480 ml   Filed Weights   10/06/20 0235 10/07/20 0500 10/08/20 0300  Weight: 136 kg (!) 138.4 kg (!) 139.3 kg    Examination:  General: A/O  x4, but easily confused and goes off subject, positive acute respiratory distress Eyes: negative scleral hemorrhage, negative anisocoria, negative icterus ENT: Negative Runny nose, negative gingival bleeding, Neck:  Negative scars, masses, torticollis, lymphadenopathy, JVD Lungs: decreased breath sounds, diffuse coarse breath sounds bilaterally without wheezes or crackles Cardiovascular: Regular rate and rhythm without murmur gallop or rub normal S1 and S2 Abdomen: MORBIDLY OBESE, negative abdominal pain, nondistended, positive soft, bowel sounds, no rebound, no ascites, no appreciable mass Extremities: No significant cyanosis, clubbing, or edema bilateral lower extremities Skin: Negative rashes, lesions, ulcers Psychiatric:  Negative depression, negative anxiety, negative fatigue, negative mania  Central nervous system:  Cranial nerves II through XII intact, tongue/uvula midline, all extremities muscle strength 5/5, sensation intact throughout, negative dysarthria, negative expressive aphasia, negative receptive aphasia.  .     Data Reviewed: Care during the described time interval was provided by me .  I have reviewed this patient's available data, including medical history, events of note, physical  examination, and all test results as part of my evaluation.  CBC: Recent Labs  Lab 10/04/20 0500 10/05/20 0720 10/06/20 0553 10/07/20 0711 10/08/20 0418  WBC 16.2* 11.0* 9.9 9.2 8.4  NEUTROABS 13.9* 8.6* 7.9* 7.3 6.4  HGB 15.6 16.0 16.3 16.0 15.8  HCT 50.0 52.2* 53.4* 51.8 52.2*  MCV 97.8 96.7 98.7 98.3 98.3  PLT 217 242 216 216 474   Basic Metabolic Panel: Recent Labs  Lab 10/04/20 0500 10/05/20 0720 10/06/20 0553 10/07/20 0711 10/08/20 0418  NA 149* 150* 152* 146* 144  K 3.3* 2.7* 2.6* 3.2* 3.2*  CL 97* 101 104 102 101  CO2 41* 39* 37* 36* 36*  GLUCOSE 123* 138* 117* 116* 119*  BUN 35* 31* 31* 24* 21  CREATININE 0.74 0.74 0.62 0.48* 0.40*  CALCIUM 9.4 9.2 9.3 8.8* 8.8*  MG 2.1 2.0 2.2 2.1 2.0  PHOS 3.1 3.0 3.2 2.7 2.6   GFR: Estimated Creatinine Clearance: 136.5 mL/min (A) (by C-G formula based on SCr of 0.4 mg/dL (L)). Liver Function Tests: Recent Labs  Lab 10/04/20 0500 10/05/20 0720 10/06/20 0553 10/07/20 0711 10/08/20 0418  AST  --   --   --   --  35  ALT  --   --   --   --  56*  ALKPHOS  --   --   --   --  39  BILITOT  --   --   --   --  0.8  PROT  --   --   --   --  6.8  ALBUMIN 3.0* 2.9* 2.9* 2.7* 2.9*   No results for input(s): LIPASE, AMYLASE in the last 168 hours. No results for input(s): AMMONIA in the last 168 hours. Coagulation Profile: No results for input(s): INR, PROTIME in the last 168 hours. Cardiac Enzymes: No results for input(s): CKTOTAL, CKMB, CKMBINDEX, TROPONINI in the last 168 hours. BNP (last 3 results) No results for input(s): PROBNP in the last 8760 hours. HbA1C: No results for input(s): HGBA1C in the last 72 hours. CBG: Recent Labs  Lab 10/07/20 0737 10/07/20 1141 10/07/20 1607 10/07/20 2138 10/08/20 0811  GLUCAP 118* 161* 144* 133* 105*   Lipid Profile: No results for input(s): CHOL, HDL, LDLCALC, TRIG, CHOLHDL, LDLDIRECT in the last 72 hours. Thyroid Function Tests: No results for input(s): TSH, T4TOTAL,  FREET4, T3FREE, THYROIDAB in the last 72 hours. Anemia Panel: No results for input(s): VITAMINB12, FOLATE, FERRITIN, TIBC, IRON, RETICCTPCT in the last 72 hours. Sepsis Labs: Recent Labs  Lab 10/08/20 0418  LATICACIDVEN 0.9    No results found for this or any previous visit (from the past 240 hour(s)).       Radiology Studies: DG Chest Port 1 View  Result Date: 10/08/2020 CLINICAL DATA:  Pneumonia.  Hypertension. EXAM: PORTABLE CHEST 1 VIEW COMPARISON:  Chest x-rays dated 10/07/2020 and 10/02/2020. FINDINGS: Stable cardiomegaly. Residual opacity within the periphery of the LEFT lower lung, presumably layering pleural effusion extending towards the fissure. Questionable small pleural effusion and/or atelectasis at the RIGHT costophrenic angle. No new lung findings. No pneumothorax is seen. IMPRESSION: 1. Stable chest x-ray. Bibasilar opacities, LEFT greater than RIGHT, most likely small pleural effusions and/or atelectasis. 2. Stable cardiomegaly. Electronically Signed   By: Franki Cabot M.D.   On: 10/08/2020 07:17   DG Chest Port 1 View  Result Date: 10/07/2020 CLINICAL DATA:  Acute respiratory failure with hypoxia EXAM: PORTABLE CHEST 1 VIEW COMPARISON:  Radiograph 10/02/2020, CT 09/25/2020 FINDINGS: Low lung volumes and basilar atelectatic changes. Some pulmonary vascular congestion and cuffing is noted with fissural thickening. Suspect some septal lines as well. There are persistent bilateral effusions including fluid within the fissure on the left. No pneumothorax. Stable cardiomediastinal contours. Removal of the previously seen endotracheal and transesophageal tube. Nasal cannula, telemetry leads overlie the chest. No acute osseous or soft tissue abnormality. Degenerative changes are present in the imaged spine and shoulders. IMPRESSION: 1. Low lung volumes with basilar atelectatic changes. 2. Pulmonary vascular congestion with cuffing which could reflect a degree of interstitial edema.  3. Persistent though slightly diminished bilateral effusions. Electronically Signed   By: Lovena Le M.D.   On: 10/07/2020 02:59        Scheduled Meds: . chlorhexidine gluconate (MEDLINE KIT)  15 mL Mouth Rinse BID  . Chlorhexidine Gluconate Cloth  6 each Topical Daily  . docusate  200 mg Per Tube BID  . enoxaparin (LOVENOX) injection  0.5 mg/kg Subcutaneous Q24H  . feeding supplement  237 mL Oral BID BM  . insulin aspart  0-15 Units Subcutaneous TID WC  . insulin aspart  0-5 Units Subcutaneous QHS  . ipratropium-albuterol  3 mL Nebulization Q6H  . metoprolol succinate  100 mg Oral Daily  . multivitamin with minerals  1 tablet Oral Daily  . pantoprazole sodium  40 mg Oral Daily  . polyethylene glycol  17 g Per Tube Daily  . potassium chloride  40 mEq Oral Q4H  . sacubitril-valsartan  1 tablet Oral BID  . sodium chloride flush  10-40 mL Intracatheter Q12H   Continuous Infusions: . sodium chloride 250 mL (10/08/20 0110)  . meropenem (MERREM) IV 1 g (10/08/20 0612)     LOS: 17 days    Time spent:40 min    Beatrice Ziehm, Geraldo Docker, MD [facility] Pager [phone]  If 7PM-7AM, please contact night-coverage [email or web address] Password [email or web address] 10/08/2020, 11:35 AM

## 2020-10-08 NOTE — Plan of Care (Signed)
?  Problem: Clinical Measurements: ?Goal: Will remain free from infection ?Outcome: Progressing ?  ?

## 2020-10-08 NOTE — TOC Progression Note (Signed)
Transition of Care [facility]) - Progression Note    Patient Details  Name: Eddie Chapman MRN: [id number] Date of Birth: 09-Mar-1961  [facility] of [facility] CM/SW Contact  Izola Price, RN Phone Number: 10/08/2020, 5:16 PM  Clinical Narrative:   10/08/20 Screened for CIR on 10/07/20. Recommending inpatient rehab consult. AC to place consult order per [facility] coordinator notes. Simmie Davies RN CM.          Expected Discharge Plan and Services                                                 Social Determinants of Health (SDOH) Interventions    Readmission Risk Interventions No flowsheet data found.

## 2020-10-09 DIAGNOSIS — J9601 Acute respiratory failure with hypoxia: Secondary | ICD-10-CM | POA: Diagnosis not present

## 2020-10-09 DIAGNOSIS — I5031 Acute diastolic (congestive) heart failure: Secondary | ICD-10-CM | POA: Diagnosis not present

## 2020-10-09 DIAGNOSIS — N179 Acute kidney failure, unspecified: Secondary | ICD-10-CM | POA: Diagnosis not present

## 2020-10-09 DIAGNOSIS — G9341 Metabolic encephalopathy: Secondary | ICD-10-CM | POA: Diagnosis not present

## 2020-10-09 LAB — COMPREHENSIVE METABOLIC PANEL
ALT: 59 U/L — ABNORMAL HIGH (ref 0–44)
AST: 46 U/L — ABNORMAL HIGH (ref 15–41)
Albumin: 2.9 g/dL — ABNORMAL LOW (ref 3.5–5.0)
Alkaline Phosphatase: 34 U/L — ABNORMAL LOW (ref 38–126)
Anion gap: 11 (ref 5–15)
BUN: 18 mg/dL (ref 8–23)
CO2: 36 mmol/L — ABNORMAL HIGH (ref 22–32)
Calcium: 8.9 mg/dL (ref 8.9–10.3)
Chloride: 95 mmol/L — ABNORMAL LOW (ref 98–111)
Creatinine, Ser: 0.52 mg/dL — ABNORMAL LOW (ref 0.61–1.24)
GFR, Estimated: >60 mL/min (ref >60)
Glucose, Bld: 109 mg/dL — ABNORMAL HIGH (ref 70–99)
Potassium: 3.1 mmol/L — ABNORMAL LOW (ref 3.5–5.1)
Sodium: 142 mmol/L (ref 135–145)
Total Bilirubin: 0.9 mg/dL (ref 0.3–1.2)
Total Protein: 6.5 g/dL (ref 6.5–8.1)

## 2020-10-09 LAB — CBC WITH DIFFERENTIAL/PLATELET
Abs Immature Granulocytes: 0.03 10*3/uL (ref 0.00–0.07)
Basophils Absolute: 0 10*3/uL (ref 0.0–0.1)
Basophils Relative: 0 %
Eosinophils Absolute: 0.1 10*3/uL (ref 0.0–0.5)
Eosinophils Relative: 1 %
HCT: 48.6 % (ref 39.0–52.0)
Hemoglobin: 15 g/dL (ref 13.0–17.0)
Immature Granulocytes: 0 %
Lymphocytes Relative: 13 %
Lymphs Abs: 1.1 10*3/uL (ref 0.7–4.0)
MCH: 29.5 pg (ref 26.0–34.0)
MCHC: 30.9 g/dL (ref 30.0–36.0)
MCV: 95.7 fL (ref 80.0–100.0)
Monocytes Absolute: 0.8 10*3/uL (ref 0.1–1.0)
Monocytes Relative: 10 %
Neutro Abs: 6 10*3/uL (ref 1.7–7.7)
Neutrophils Relative %: 76 %
Platelets: 199 10*3/uL (ref 150–400)
RBC: 5.08 MIL/uL (ref 4.22–5.81)
RDW: 12.6 % (ref 11.5–15.5)
WBC: 8 10*3/uL (ref 4.0–10.5)
nRBC: 0 % (ref 0.0–0.2)

## 2020-10-09 LAB — MAGNESIUM: Magnesium: 1.7 mg/dL (ref 1.7–2.4)

## 2020-10-09 LAB — GLUCOSE, CAPILLARY
Glucose-Capillary: 97 mg/dL (ref 70–99)
Glucose-Capillary: 100 mg/dL — ABNORMAL HIGH (ref 70–99)
Glucose-Capillary: 146 mg/dL — ABNORMAL HIGH (ref 70–99)
Glucose-Capillary: 118 mg/dL — ABNORMAL HIGH (ref 70–99)
Glucose-Capillary: 130 mg/dL — ABNORMAL HIGH (ref 70–99)

## 2020-10-09 LAB — PHOSPHORUS: Phosphorus: 2 mg/dL — ABNORMAL LOW (ref 2.5–4.6)

## 2020-10-09 MED ORDER — POTASSIUM CHLORIDE 10 MEQ/100ML IV SOLN
10.0000 meq | INTRAVENOUS | Status: DC
  Filled 2020-10-09 (×3): qty 100

## 2020-10-09 MED ORDER — MAGNESIUM SULFATE 2 GM/50ML IV SOLN
2.0000 g | Freq: Once | INTRAVENOUS | Status: AC
  Administered 2020-10-09: 2 g via INTRAVENOUS
  Filled 2020-10-09: qty 50

## 2020-10-09 MED ORDER — POTASSIUM CHLORIDE 10 MEQ/100ML IV SOLN
10.0000 meq | INTRAVENOUS | Status: AC
  Administered 2020-10-09 (×3): 10 meq via INTRAVENOUS
  Filled 2020-10-09 (×6): qty 100

## 2020-10-09 MED ORDER — K PHOS MONO-SOD PHOS DI & MONO 155-852-130 MG PO TABS
500.0000 mg | ORAL_TABLET | Freq: Four times a day (QID) | ORAL | Status: DC
  Administered 2020-10-09: 500 mg via ORAL
  Filled 2020-10-09 (×4): qty 2

## 2020-10-09 MED ORDER — MAGNESIUM SULFATE 2 GM/50ML IV SOLN
INTRAVENOUS | Status: AC
  Filled 2020-10-09: qty 50

## 2020-10-09 MED ORDER — POTASSIUM CHLORIDE 10 MEQ/100ML IV SOLN
10.0000 meq | INTRAVENOUS | Status: AC
  Administered 2020-10-09 – 2020-10-10 (×2): 10 meq via INTRAVENOUS
  Filled 2020-10-09 (×2): qty 100

## 2020-10-09 MED ORDER — POTASSIUM CHLORIDE 10 MEQ/100ML IV SOLN
10.0000 meq | Freq: Once | INTRAVENOUS | Status: DC
  Filled 2020-10-09: qty 100

## 2020-10-09 MED ORDER — POTASSIUM CHLORIDE CRYS ER 20 MEQ PO TBCR
40.0000 meq | EXTENDED_RELEASE_TABLET | Freq: Two times a day (BID) | ORAL | Status: DC
  Administered 2020-10-09: 40 meq via ORAL
  Filled 2020-10-09: qty 2

## 2020-10-09 NOTE — Progress Notes (Signed)
PROGRESS NOTE    Eddie Chapman  UKG:[id number] DOB: 1961/01/08 DOA: 09/21/2020 PCP: Adin Hector, MD     Brief Narrative:  60 yo WM PMHx Cerrebral aneurysm, PVD, Chronic Diastolic CHF , Pulmonary HTN, Morbid Obesity, OSA, GERD, Hx of TBI, dyslipidemia   who came in after girlfriend found him to be unresponsive.  He was found to be severely hypoxemic in circulatory shock with left lung infiltrate on CXR. Labwork consistent with sepsis.   Subjective: 1/10 afebrile overnight A/O x4, follows all commands answers all questions appropriately.  Girlfriend states his cognition is improving   Assessment & Plan: Covid vaccination; vaccinated 3/3   Active Problems:   Essential hypertension   Septic shock ([place])   Acute respiratory failure with hypoxia and hypercapnia (HCC)   Pneumonia involving left lung   OSA (obstructive sleep apnea)   Obesity hypoventilation syndrome (HCC)   Acute metabolic encephalopathy   Acute diastolic CHF (congestive heart failure) ([place])   AKI (acute kidney injury) ([place])   Morbid obesity with BMI of 40.0-44.9, adult ([place])  Septic shock -on admission RR> 20,HR> 90, organism of infection lungs, lactic acid> 2.  Started on vasopressors -09/21/2020 patient was intubated.----> 10/03/2020 extubated  Acute respiratory failure with hypoxia and hypercapnia/LEFT lung pneumonia -At high risk for reintubation  -Patient continues require O2 support. - Titrate O2 to maintain SPO2> 92% -Flutter valve - Incentive spirometry - DuoNeb -Out of bed to chair q shift -Complete course of antibiotics  OSA/OHS -CPAP per respiratory  Acute metabolic encephalopathy -Most likely secondary to critical illness  Acute Systolic CHF - 76/28 echocardiogram shows mildly reduced EF, see results below - Strict in and out -3.8 L - Daily weight Filed Weights   10/07/20 0500 10/08/20 0300 10/09/20 0145  Weight: (!) 138.4 kg (!) 139.3 kg (!) 137.4 kg  -1/9 increase Toprol 125 mg  daily -1 /9 Lasix IV 60 mg x 1  Essential HTN - See acute CHF  AKI due to ATN Lab Results  Component Value Date   CREATININE 0.52 (L) 10/09/2020   CREATININE 0.40 (L) 10/08/2020   CREATININE 0.48 (L) 10/07/2020   CREATININE 0.62 10/06/2020   CREATININE 0.74 10/05/2020  -Resolved  Morbid obesity with BMI of 40-44.9  Hypokalemia - Potassium goal> 4 - 1/10 potassium IV 60 mEq  Hypomagnesmia - Magnesium goal> 2 - Magnesium IV 2 g     DVT prophylaxis: SCD Code Status: Full Family Communication: 1/10 significant other(girlfriend) at bedside for discussion of plan of care answered all questions Status is: Inpatient    Dispo: The patient is from: Home              Anticipated d/c is to: Home              Anticipated d/c date is: 1/18              Patient currently unstable      Consultants:  PCCM   Procedures/Significant Events:  12/24 echocardiogram;LVEF= 50 to 55%. No regional wall motion abnormalities. Pulmonic Valve: The pulmonic valve was normal in structure. Pulmonic valve  regurgitation is not visualized.  09/22/20- patient is off vasopressors. He is weaning on FIO2. Family at bedside (girlfriend). Patient is weaned to 40% FIO2. 09/23/20-patient failed SBT today. 09/25/20-patient with continued tenacious secretions and patient also with altered mentation. Started Precedex in hopes to be able to wean Versed. As patient extremely altered and difficult to arouse. CT head and CT chest obtained 09/26/20-Pateint following  commands intermittently but agitated.  SBT attempted this morning and failed. Patient was taking TV of 200 on 10/5. RR increased to 40. Placed back on rate and sedaiton increased.  09/27/2020-patient attempted another spontaneous breathing trial. Patient becomes extremely agitated. Patient takes shallow breaths and has high respiratory rate. 12/30-remains heavily sedated 09/29/2020:Poorly responsive when sedation is lightened, does  not tolerate SBT 10/01/2019:Not tolerating SBT, will need goals of care addressed 10/02/2019:More responsive, still unable to tolerate SBT well 10/03/19- failed SBT today, palliative care evaluation in process 10/04/19- patient passed SBT and was extubated to BIPAP. I spoke with patients father and met with GF at bedside for a second update. 1/5 remains extubated, high risk for intubation 1/6 remains on high flow high risk for intubation  I have personally reviewed and interpreted all radiology studies and my findings are as above.  VENTILATOR SETTINGS: Nasal cannula 1/10 Flow 6 L/min SPO2 94%   Cultures 12/23 SARS coronavirus negative 12/23 influenza A/B negative 12/24 HIV screen negative 12/28 blood positive staph epidermidis 1/4 bottles considered contaminant    Antimicrobials: Anti-infectives (From admission, onward)   Start     Ordered Stop   10/05/20 0600  vancomycin (VANCOREADY) IVPB 1500 mg/300 mL  Status:  Discontinued        10/04/20 1734 10/06/20 1014   10/04/20 2200  meropenem (MERREM) 1 g in sodium chloride 0.9 % 100 mL IVPB        10/04/20 1730 10/11/20 2159   10/04/20 2015  vancomycin (VANCOREADY) IVPB 500 mg/100 mL       "Followed by" Linked Group Details   10/04/20 1730 10/04/20 2133   10/04/20 1815  vancomycin (VANCOREADY) IVPB 2000 mg/400 mL       "Followed by" Linked Group Details   10/04/20 1730 10/05/20 0417   09/21/20 1845  piperacillin-tazobactam (ZOSYN) IVPB 3.375 g  Status:  Discontinued        09/21/20 1805 09/28/20 1008   09/21/20 1530  vancomycin (VANCOREADY) IVPB 1500 mg/300 mL        09/21/20 1425 09/21/20 2051   09/21/20 1430  ceFEPIme (MAXIPIME) 2 g in sodium chloride 0.9 % 100 mL IVPB        09/21/20 1416 09/21/20 1606   09/21/20 1430  vancomycin (VANCOCIN) IVPB 1000 mg/200 mL premix        09/21/20 1416 09/21/20 1714   09/21/20 1430  vancomycin (VANCOREADY) IVPB 1500 mg/300 mL  Status:  Discontinued        09/21/20 1425 09/21/20 1425        Devices    LINES / TUBES:      Continuous Infusions: . sodium chloride 250 mL (10/09/20 0506)  . meropenem (MERREM) IV 1 g (10/09/20 0507)     Objective: Vitals:   10/09/20 0145 10/09/20 0306 10/09/20 0816 10/09/20 1147  BP:  (!) 149/94 (!) 151/96 (!) 151/86  Pulse:  85 95 100  Resp:  _0 Temp:  98 F (36.7 C) 98.6 F (37 C) 98.2 F (36.8 C)  TempSrc:  Oral Oral Oral  SpO2:  92% 95% 94%  Weight: (!) 137.4 kg     Height:        Intake/Output Summary (Last 24 hours) at 10/09/2020 1337 Last data filed at 10/09/2020 1148 Gross per 24 hour  Intake 540 ml  Output 1600 ml  Net -1060 ml   Filed Weights   10/07/20 0500 10/08/20 0300 10/09/20 0145  Weight: (!) 138.4 kg (!) 139.3 kg Marland Kitchen)  137.4 kg    Examination:  General: A/O x4, but easily confused and goes off subject, positive acute respiratory distress Eyes: negative scleral hemorrhage, negative anisocoria, negative icterus ENT: Negative Runny nose, negative gingival bleeding, Neck:  Negative scars, masses, torticollis, lymphadenopathy, JVD Lungs: decreased breath sounds, diffuse coarse breath sounds bilaterally without wheezes or crackles Cardiovascular: Regular rate and rhythm without murmur gallop or rub normal S1 and S2 Abdomen: MORBIDLY OBESE, negative abdominal pain, nondistended, positive soft, bowel sounds, no rebound, no ascites, no appreciable mass Extremities: No significant cyanosis, clubbing, or edema bilateral lower extremities Skin: Negative rashes, lesions, ulcers Psychiatric:  Negative depression, negative anxiety, negative fatigue, negative mania  Central nervous system:  Cranial nerves II through XII intact, tongue/uvula midline, all extremities muscle strength 5/5, sensation intact throughout, negative dysarthria, negative expressive aphasia, negative receptive aphasia.  .     Data Reviewed: Care during the described time interval was provided by me .  I have reviewed this patient's  available data, including medical history, events of note, physical examination, and all test results as part of my evaluation.  CBC: Recent Labs  Lab 10/05/20 0720 10/06/20 0553 10/07/20 0711 10/08/20 0418 10/09/20 0321  WBC 11.0* 9.9 9.2 8.4 8.0  NEUTROABS 8.6* 7.9* 7.3 6.4 6.0  HGB 16.0 16.3 16.0 15.8 15.0  HCT 52.2* 53.4* 51.8 52.2* 48.6  MCV 96.7 98.7 98.3 98.3 95.7  PLT 242 216 216 200 858   Basic Metabolic Panel: Recent Labs  Lab 10/05/20 0720 10/06/20 0553 10/07/20 0711 10/08/20 0418 10/08/20 1457 10/09/20 0321  NA 150* 152* 146* 144  --  142  K 2.7* 2.6* 3.2* 3.2* 4.3 3.1*  CL 101 104 102 101  --  95*  CO2 39* 37* 36* 36*  --  36*  GLUCOSE 138* 117* 116* 119*  --  109*  BUN 31* 31* 24* 21  --  18  CREATININE 0.74 0.62 0.48* 0.40*  --  0.52*  CALCIUM 9.2 9.3 8.8* 8.8*  --  8.9  MG 2.0 2.2 2.1 2.0 2.6* 1.7  PHOS 3.0 3.2 2.7 2.6  --  2.0*   GFR: Estimated Creatinine Clearance: 135.5 mL/min (A) (by C-G formula based on SCr of 0.52 mg/dL (L)). Liver Function Tests: Recent Labs  Lab 10/05/20 0720 10/06/20 0553 10/07/20 0711 10/08/20 0418 10/09/20 0321  AST  --   --   --  35 46*  ALT  --   --   --  56* 59*  ALKPHOS  --   --   --  39 34*  BILITOT  --   --   --  0.8 0.9  PROT  --   --   --  6.8 6.5  ALBUMIN 2.9* 2.9* 2.7* 2.9* 2.9*   No results for input(s): LIPASE, AMYLASE in the last 168 hours. No results for input(s): AMMONIA in the last 168 hours. Coagulation Profile: No results for input(s): INR, PROTIME in the last 168 hours. Cardiac Enzymes: No results for input(s): CKTOTAL, CKMB, CKMBINDEX, TROPONINI in the last 168 hours. BNP (last 3 results) No results for input(s): PROBNP in the last 8760 hours. HbA1C: No results for input(s): HGBA1C in the last 72 hours. CBG: Recent Labs  Lab 10/08/20 1811 10/08/20 2142 10/09/20 0824 10/09/20 0848 10/09/20 1146  GLUCAP 119* 125* 97 100* 146*   Lipid Profile: No results for input(s): CHOL, HDL,  LDLCALC, TRIG, CHOLHDL, LDLDIRECT in the last 72 hours. Thyroid Function Tests: No results for input(s): TSH, T4TOTAL, FREET4, T3FREE,  THYROIDAB in the last 72 hours. Anemia Panel: No results for input(s): VITAMINB12, FOLATE, FERRITIN, TIBC, IRON, RETICCTPCT in the last 72 hours. Sepsis Labs: Recent Labs  Lab 10/08/20 0418  LATICACIDVEN 0.9    No results found for this or any previous visit (from the past 240 hour(s)).       Radiology Studies: DG Chest Port 1 View  Result Date: 10/08/2020 CLINICAL DATA:  Pneumonia.  Hypertension. EXAM: PORTABLE CHEST 1 VIEW COMPARISON:  Chest x-rays dated 10/07/2020 and 10/02/2020. FINDINGS: Stable cardiomegaly. Residual opacity within the periphery of the LEFT lower lung, presumably layering pleural effusion extending towards the fissure. Questionable small pleural effusion and/or atelectasis at the RIGHT costophrenic angle. No new lung findings. No pneumothorax is seen. IMPRESSION: 1. Stable chest x-ray. Bibasilar opacities, LEFT greater than RIGHT, most likely small pleural effusions and/or atelectasis. 2. Stable cardiomegaly. Electronically Signed   By: Franki Cabot M.D.   On: 10/08/2020 07:17        Scheduled Meds: . chlorhexidine gluconate (MEDLINE KIT)  15 mL Mouth Rinse BID  . Chlorhexidine Gluconate Cloth  6 each Topical Daily  . docusate  200 mg Per Tube BID  . enoxaparin (LOVENOX) injection  0.5 mg/kg Subcutaneous Q24H  . feeding supplement  237 mL Oral BID BM  . insulin aspart  0-15 Units Subcutaneous TID WC  . insulin aspart  0-5 Units Subcutaneous QHS  . ipratropium-albuterol  3 mL Nebulization QID  . metoprolol succinate  125 mg Oral Daily  . multivitamin with minerals  1 tablet Oral Daily  . pantoprazole sodium  40 mg Oral Daily  . phosphorus  500 mg Oral QID  . polyethylene glycol  17 g Per Tube Daily  . potassium chloride  40 mEq Oral BID  . sacubitril-valsartan  1 tablet Oral BID  . sodium chloride flush  10-40 mL  Intracatheter Q12H   Continuous Infusions: . sodium chloride 250 mL (10/09/20 0506)  . meropenem (MERREM) IV 1 g (10/09/20 0507)     LOS: 18 days    Time spent:40 min    Erskin Zinda, Geraldo Docker, MD [facility] Pager [phone]  If 7PM-7AM, please contact night-coverage [email or web address] Password [email or web address] 10/09/2020, 1:37 PM

## 2020-10-09 NOTE — Care Management Important Message (Signed)
Important Message  Patient Details  Name: Eddie Chapman MRN: [id number] Date of Birth: [birth year]   Medicare Important Message Given:  Yes     Dannette Barbara 10/09/2020, 12:22 PM

## 2020-10-09 NOTE — Progress Notes (Signed)
PHARMACY CONSULT NOTE - FOLLOW UP  Pharmacy Consult for Electrolyte Monitoring and Replacement   Recent Labs: Potassium (mmol/L)  Date Value  10/09/2020 3.1 (L)  05/04/2014 4.3   Magnesium (mg/dL)  Date Value  10/09/2020 1.7   Calcium (mg/dL)  Date Value  10/09/2020 8.9   Calcium, Total (mg/dL)  Date Value  05/04/2014 9.1   Albumin (g/dL)  Date Value  10/09/2020 2.9 (L)   Phosphorus (mg/dL)  Date Value  10/09/2020 2.0 (L)   Sodium (mmol/L)  Date Value  10/09/2020 142  05/04/2014 140   Assessment: 60 year old male presented with AMS due to septic shock d/t PNA. PMH includes OSA and CHF. Patient has been on diuresis with electrolyte abnormalities. Pt last received Lasix 60 mg once on 1/9. Pharmacy to manage electrolytes.  Goal of Therapy:  Electrolytes WNL  Plan:  Hypokalemia persistent despite replacement. Restarted Entresto on 1/7 which may also increase potassium as well.   K 3.1 - Will give KCl 40 mEq Po x2 today.   Mg 1.7 - Give mg 2g IV x 1 dose  Phos 2.0 - give K Phos Neutral 500 mg x 4 doses  Follow up electrolytes with morning labs.  Benn Moulder, PharmD Pharmacy Resident  10/09/2020 10:59 AM

## 2020-10-09 NOTE — Plan of Care (Signed)
?  Problem: Clinical Measurements: ?Goal: Will remain free from infection ?Outcome: Progressing ?  ?

## 2020-10-09 NOTE — Progress Notes (Signed)
Inpatient Rehab Admissions:  Inpatient Rehab Consult received.  I spoke with pt and his significant other for rehabilitation assessment and to discuss goals and expectations of an inpatient rehab admission.  Pt unsure he wants to come to [facility] for rehab.  Would like to think about it and get back to me.  I will f/u with pt tomorrow.   Signed: Shann Medal, PT, DPT Admissions Coordinator [phone] 10/09/20  4:21 PM

## 2020-10-10 ENCOUNTER — Inpatient Hospital Stay: Payer: Self-pay

## 2020-10-10 ENCOUNTER — Inpatient Hospital Stay: Payer: PPO

## 2020-10-10 DIAGNOSIS — G9341 Metabolic encephalopathy: Secondary | ICD-10-CM | POA: Diagnosis not present

## 2020-10-10 DIAGNOSIS — I5031 Acute diastolic (congestive) heart failure: Secondary | ICD-10-CM | POA: Diagnosis not present

## 2020-10-10 DIAGNOSIS — Z7189 Other specified counseling: Secondary | ICD-10-CM | POA: Diagnosis not present

## 2020-10-10 DIAGNOSIS — J9601 Acute respiratory failure with hypoxia: Secondary | ICD-10-CM | POA: Diagnosis not present

## 2020-10-10 DIAGNOSIS — Z515 Encounter for palliative care: Secondary | ICD-10-CM | POA: Diagnosis not present

## 2020-10-10 DIAGNOSIS — J9600 Acute respiratory failure, unspecified whether with hypoxia or hypercapnia: Secondary | ICD-10-CM

## 2020-10-10 DIAGNOSIS — J9602 Acute respiratory failure with hypercapnia: Secondary | ICD-10-CM | POA: Diagnosis not present

## 2020-10-10 LAB — PHOSPHORUS: Phosphorus: 3 mg/dL (ref 2.5–4.6)

## 2020-10-10 LAB — CBC WITH DIFFERENTIAL/PLATELET
Abs Immature Granulocytes: 0.03 10*3/uL (ref 0.00–0.07)
Basophils Absolute: 0 10*3/uL (ref 0.0–0.1)
Basophils Relative: 1 %
Eosinophils Absolute: 0.1 10*3/uL (ref 0.0–0.5)
Eosinophils Relative: 1 %
HCT: 50.4 % (ref 39.0–52.0)
Hemoglobin: 16.3 g/dL (ref 13.0–17.0)
Immature Granulocytes: 0 %
Lymphocytes Relative: 11 %
Lymphs Abs: 1 10*3/uL (ref 0.7–4.0)
MCH: 30.6 pg (ref 26.0–34.0)
MCHC: 32.3 g/dL (ref 30.0–36.0)
MCV: 94.6 fL (ref 80.0–100.0)
Monocytes Absolute: 0.9 10*3/uL (ref 0.1–1.0)
Monocytes Relative: 11 %
Neutro Abs: 6.5 10*3/uL (ref 1.7–7.7)
Neutrophils Relative %: 76 %
Platelets: 188 10*3/uL (ref 150–400)
RBC: 5.33 MIL/uL (ref 4.22–5.81)
RDW: 12.6 % (ref 11.5–15.5)
WBC: 8.5 10*3/uL (ref 4.0–10.5)
nRBC: 0 % (ref 0.0–0.2)

## 2020-10-10 LAB — GLUCOSE, CAPILLARY
Glucose-Capillary: 118 mg/dL — ABNORMAL HIGH (ref 70–99)
Glucose-Capillary: 92 mg/dL (ref 70–99)
Glucose-Capillary: 104 mg/dL — ABNORMAL HIGH (ref 70–99)
Glucose-Capillary: 121 mg/dL — ABNORMAL HIGH (ref 70–99)
Glucose-Capillary: 117 mg/dL — ABNORMAL HIGH (ref 70–99)
Glucose-Capillary: 129 mg/dL — ABNORMAL HIGH (ref 70–99)

## 2020-10-10 LAB — BLOOD GAS, ARTERIAL
Acid-Base Excess: 18.3 mmol/L — ABNORMAL HIGH (ref 0.0–2.0)
Bicarbonate: 44.3 mmol/L — ABNORMAL HIGH (ref 20.0–28.0)
FIO2: 100
MECHVT: 550 mL
O2 Saturation: 99.9 %
PEEP: 5 cmH2O
Patient temperature: 37
RATE: 16 resp/min
pCO2 arterial: 53 mmHg — ABNORMAL HIGH (ref 32.0–48.0)
pH, Arterial: 7.53 — ABNORMAL HIGH (ref 7.350–7.450)
pO2, Arterial: 306 mmHg — ABNORMAL HIGH (ref 83.0–108.0)

## 2020-10-10 LAB — COMPREHENSIVE METABOLIC PANEL
ALT: 73 U/L — ABNORMAL HIGH (ref 0–44)
AST: 55 U/L — ABNORMAL HIGH (ref 15–41)
Albumin: 3 g/dL — ABNORMAL LOW (ref 3.5–5.0)
Alkaline Phosphatase: 44 U/L (ref 38–126)
Anion gap: 9 (ref 5–15)
BUN: 14 mg/dL (ref 8–23)
CO2: 37 mmol/L — ABNORMAL HIGH (ref 22–32)
Calcium: 8.6 mg/dL — ABNORMAL LOW (ref 8.9–10.3)
Chloride: 97 mmol/L — ABNORMAL LOW (ref 98–111)
Creatinine, Ser: 0.46 mg/dL — ABNORMAL LOW (ref 0.61–1.24)
GFR, Estimated: >60 mL/min (ref >60)
Glucose, Bld: 127 mg/dL — ABNORMAL HIGH (ref 70–99)
Potassium: 3.7 mmol/L (ref 3.5–5.1)
Sodium: 143 mmol/L (ref 135–145)
Total Bilirubin: 0.8 mg/dL (ref 0.3–1.2)
Total Protein: 6.9 g/dL (ref 6.5–8.1)

## 2020-10-10 LAB — TROPONIN I (HIGH SENSITIVITY)
Troponin I (High Sensitivity): 19 ng/L — ABNORMAL HIGH (ref <18)
Troponin I (High Sensitivity): 19 ng/L — ABNORMAL HIGH (ref <18)

## 2020-10-10 LAB — PROTIME-INR
INR: 1.1 (ref 0.8–1.2)
Prothrombin Time: 13.4 seconds (ref 11.4–15.2)

## 2020-10-10 LAB — LACTIC ACID, PLASMA
Lactic Acid, Venous: 0.7 mmol/L (ref 0.5–1.9)
Lactic Acid, Venous: 0.9 mmol/L (ref 0.5–1.9)

## 2020-10-10 LAB — TRIGLYCERIDES: Triglycerides: 147 mg/dL (ref <150)

## 2020-10-10 LAB — MAGNESIUM: Magnesium: 1.8 mg/dL (ref 1.7–2.4)

## 2020-10-10 MED ORDER — SODIUM CHLORIDE 0.9 % IV SOLN
250.0000 mL | INTRAVENOUS | Status: DC
  Administered 2020-10-10: 250 mL via INTRAVENOUS

## 2020-10-10 MED ORDER — POTASSIUM CHLORIDE CRYS ER 20 MEQ PO TBCR: 10.0000 meq | EXTENDED_RELEASE_TABLET | Freq: Once | ORAL | Status: DC

## 2020-10-10 MED ORDER — POTASSIUM CHLORIDE CRYS ER 10 MEQ PO TBCR: 10.0000 meq | EXTENDED_RELEASE_TABLET | Freq: Once | ORAL | Status: DC

## 2020-10-10 MED ORDER — ACETAMINOPHEN 325 MG PO TABS: 650.0000 mg | ORAL_TABLET | Freq: Four times a day (QID) | ORAL | Status: DC | PRN

## 2020-10-10 MED ORDER — SODIUM CHLORIDE 0.9 % IV SOLN: INTRAVENOUS | Status: DC

## 2020-10-10 MED ORDER — ORAL CARE MOUTH RINSE
15.0000 mL | OROMUCOSAL | Status: DC
  Administered 2020-10-10 – 2020-10-14 (×28): 15 mL via OROMUCOSAL

## 2020-10-10 MED ORDER — INSULIN ASPART 100 UNIT/ML ~~LOC~~ SOLN
0.0000 [IU] | SUBCUTANEOUS | Status: DC
  Administered 2020-10-10 – 2020-10-14 (×5): 2 [IU] via SUBCUTANEOUS
  Administered 2020-10-16: 3 [IU] via SUBCUTANEOUS
  Administered 2020-10-16 – 2020-10-17 (×2): 2 [IU] via SUBCUTANEOUS
  Administered 2020-10-17: 3 [IU] via SUBCUTANEOUS
  Administered 2020-10-18 – 2020-10-19 (×2): 2 [IU] via SUBCUTANEOUS
  Administered 2020-10-20: 3 [IU] via SUBCUTANEOUS
  Filled 2020-10-10 (×13): qty 1

## 2020-10-10 MED ORDER — MIDAZOLAM HCL 2 MG/2ML IJ SOLN
2.0000 mg | INTRAMUSCULAR | Status: DC | PRN
  Filled 2020-10-10: qty 2

## 2020-10-10 MED ORDER — SODIUM CHLORIDE 0.9% FLUSH: 10.0000 mL | INTRAVENOUS | Status: DC | PRN

## 2020-10-10 MED ORDER — ADULT MULTIVITAMIN W/MINERALS CH
1.0000 | ORAL_TABLET | Freq: Every day | ORAL | Status: DC
  Administered 2020-10-11: 1
  Filled 2020-10-10: qty 1

## 2020-10-10 MED ORDER — FENTANYL 2500MCG IN NS 250ML (10MCG/ML) PREMIX INFUSION
50.0000 ug/h | INTRAVENOUS | Status: DC
  Administered 2020-10-10: 100 ug/h via INTRAVENOUS
  Administered 2020-10-10: 200 ug/h via INTRAVENOUS
  Administered 2020-10-11: 125 ug/h via INTRAVENOUS
  Administered 2020-10-12: 150 ug/h via INTRAVENOUS
  Filled 2020-10-10 (×3): qty 250

## 2020-10-10 MED ORDER — CHLORHEXIDINE GLUCONATE 0.12% ORAL RINSE (MEDLINE KIT)
15.0000 mL | Freq: Two times a day (BID) | OROMUCOSAL | Status: DC
  Administered 2020-10-10 – 2020-10-19 (×17): 15 mL via OROMUCOSAL

## 2020-10-10 MED ORDER — SODIUM CHLORIDE 0.9 % IV BOLUS: 250.0000 mL | Freq: Once | INTRAVENOUS | Status: DC | PRN

## 2020-10-10 MED ORDER — NOREPINEPHRINE 4 MG/250ML-% IV SOLN
2.0000 ug/min | INTRAVENOUS | Status: DC
  Administered 2020-10-10: 8 ug/min via INTRAVENOUS
  Administered 2020-10-10: 4 ug/min via INTRAVENOUS
  Filled 2020-10-10 (×2): qty 250

## 2020-10-10 MED ORDER — FENTANYL CITRATE (PF) 100 MCG/2ML IJ SOLN
50.0000 ug | Freq: Once | INTRAMUSCULAR | Status: DC
Start: 2020-10-10 — End: 2020-10-15

## 2020-10-10 MED ORDER — PIPERACILLIN-TAZOBACTAM 3.375 G IVPB: 3.3750 g | Freq: Three times a day (TID) | INTRAVENOUS | Status: DC

## 2020-10-10 MED ORDER — FAMOTIDINE IN NACL 20-0.9 MG/50ML-% IV SOLN
20.0000 mg | Freq: Two times a day (BID) | INTRAVENOUS | Status: DC
  Filled 2020-10-10 (×2): qty 50

## 2020-10-10 MED ORDER — SODIUM CHLORIDE 0.9% FLUSH
10.0000 mL | Freq: Two times a day (BID) | INTRAVENOUS | Status: DC
  Administered 2020-10-10 – 2020-10-14 (×8): 10 mL
  Administered 2020-10-14: 20 mL
  Administered 2020-10-15 – 2020-10-16 (×4): 10 mL
  Administered 2020-10-17: 30 mL
  Administered 2020-10-17 – 2020-10-19 (×5): 10 mL

## 2020-10-10 MED ORDER — PANTOPRAZOLE SODIUM 40 MG IV SOLR
40.0000 mg | INTRAVENOUS | Status: DC
  Administered 2020-10-10 – 2020-10-15 (×6): 40 mg via INTRAVENOUS
  Filled 2020-10-10 (×6): qty 40

## 2020-10-10 MED ORDER — FENTANYL BOLUS VIA INFUSION
50.0000 ug | INTRAVENOUS | Status: DC | PRN
  Filled 2020-10-10: qty 50

## 2020-10-10 MED ORDER — NOREPINEPHRINE 4 MG/250ML-% IV SOLN
INTRAVENOUS | Status: AC
  Administered 2020-10-10: 5 ug/min via INTRAVENOUS
  Filled 2020-10-10: qty 250

## 2020-10-10 MED ORDER — PROPOFOL 1000 MG/100ML IV EMUL
0.0000 ug/kg/min | INTRAVENOUS | Status: DC
  Administered 2020-10-10 (×2): 30 ug/kg/min via INTRAVENOUS
  Administered 2020-10-10: 40 ug/kg/min via INTRAVENOUS
  Administered 2020-10-10: 30 ug/kg/min via INTRAVENOUS
  Administered 2020-10-10: 20 ug/kg/min via INTRAVENOUS
  Administered 2020-10-10: 30 ug/kg/min via INTRAVENOUS
  Administered 2020-10-11 (×4): 50 ug/kg/min via INTRAVENOUS
  Administered 2020-10-11: 20.015 ug/kg/min via INTRAVENOUS
  Administered 2020-10-11 (×2): 30 ug/kg/min via INTRAVENOUS
  Administered 2020-10-12 (×3): 50 ug/kg/min via INTRAVENOUS
  Filled 2020-10-10 (×17): qty 100

## 2020-10-10 MED ORDER — IPRATROPIUM-ALBUTEROL 0.5-2.5 (3) MG/3ML IN SOLN: 3.0000 mL | RESPIRATORY_TRACT | Status: DC | PRN

## 2020-10-10 MED ORDER — POLYETHYLENE GLYCOL 3350 17 G PO PACK: 17.0000 g | PACK | Freq: Every day | ORAL | Status: DC | PRN

## 2020-10-10 NOTE — Progress Notes (Addendum)
This RN came  from break and went to check on the  patient and noted he's was  unresponsive and gasping for air. Code blue was called . No chest compression was performed. See epic for new orders and documentation.

## 2020-10-10 NOTE — Progress Notes (Signed)
Patient was restless, anxious and keep on getting out of bed multiple times. Ativan 2 mg IV administered for anxiety. Will continue to monitor.

## 2020-10-10 NOTE — Progress Notes (Signed)
PHARMACY CONSULT NOTE - FOLLOW UP  Pharmacy Consult for Electrolyte Monitoring and Replacement   Recent Labs: Potassium (mmol/L)  Date Value  10/10/2020 3.7  05/04/2014 4.3   Magnesium (mg/dL)  Date Value  10/10/2020 1.8   Calcium (mg/dL)  Date Value  10/10/2020 8.6 (L)   Calcium, Total (mg/dL)  Date Value  05/04/2014 9.1   Albumin (g/dL)  Date Value  10/10/2020 3.0 (L)   Phosphorus (mg/dL)  Date Value  10/10/2020 3.0   Sodium (mmol/L)  Date Value  10/10/2020 143  05/04/2014 140   Assessment: 60 year old male presented with AMS due to septic shock d/t PNA. PMH includes OSA and CHF. Patient has been on diuresis with electrolyte abnormalities. Pt last received Lasix 60 mg once on 1/9. Pharmacy to manage electrolytes.  Goal of Therapy:  Electrolytes WNL  Plan:  No replacement indicated today.  Follow up electrolytes with morning labs.  Tawnya Crook, PharmD Clinical Pharmacist  10/10/2020 1:35 PM

## 2020-10-10 NOTE — Progress Notes (Signed)
SLP Cancellation Note  Patient Details Name: Eddie Chapman MRN: [id number] DOB: 22-Jan-1961   Cancelled treatment:       Reason Eval/Treat Not Completed: Medical issues which prohibited therapy (chart reviewed). Per chart review, patient noted with transfer to CCU s/p CODE BLUE; now intubated/sedated. +resp distress and severe resp failure; +ileus per chart note. Per guidelines, will require new orders when ready to resume rehab services. Will complete initial order; please re-consult when medically appropriate. Recommend frequent oral care.      Orinda Kenner, MS, CCC-SLP Speech Language Pathologist Rehab Services [phone] [facility] 10/10/2020, 10:12 AM

## 2020-10-10 NOTE — Progress Notes (Signed)
Chart reviewed  Patient emergently intubated on 1/11 for hypoxic resp failure, airway protection PCCM team to assume primary care  [facility] hospitalist service to sign off at this time Please re-consult when patient stable for transfer to floor.  Ralene Muskrat MD

## 2020-10-10 NOTE — Progress Notes (Signed)
   10/10/20 0400  Clinical Encounter Type  Visited With Patient not available;Health care provider  Visit Type Follow-up;Code  Referral From Nurse   Code [facility] [place] page received for the patient. Upon arrival, nursing staff informed this writer of the patient's stable status. No needs at this time. Will continue to follow.  Gennaro Africa, Chaplain

## 2020-10-10 NOTE — ED Provider Notes (Signed)
[facility] Department of Emergency Medicine   Code Blue CONSULT NOTE  Chief Complaint: Cardiac arrest/unresponsive   Level V Caveat: Unresponsive  History of present illness: I was contacted by the hospital for a CODE BLUE cardiac arrest upstairs and presented to the patient's bedside. Patient admitted for respiratory failure and PNA on 12/23. Intubated and septic shock. Extubated on 10/03/20. Found by RN unresponsive, snoring, clammy. Patient was breathing on his own and had a pulse.    ROS: Unable to obtain, Level V caveat  Scheduled Meds: . chlorhexidine gluconate (MEDLINE KIT)  15 mL Mouth Rinse BID  . Chlorhexidine Gluconate Cloth  6 each Topical Daily  . docusate  200 mg Per Tube BID  . enoxaparin (LOVENOX) injection  0.5 mg/kg Subcutaneous Q24H  . feeding supplement  237 mL Oral BID BM  . fentaNYL (SUBLIMAZE) injection  50 mcg Intravenous Once  . insulin aspart  0-15 Units Subcutaneous TID WC  . insulin aspart  0-5 Units Subcutaneous QHS  . ipratropium-albuterol  3 mL Nebulization QID  . metoprolol succinate  125 mg Oral Daily  . multivitamin with minerals  1 tablet Oral Daily  . pantoprazole sodium  40 mg Oral Daily  . polyethylene glycol  17 g Per Tube Daily  . potassium chloride  10 mEq Oral Once  . sacubitril-valsartan  1 tablet Oral BID  . sodium chloride flush  10-40 mL Intracatheter Q12H   Continuous Infusions: . sodium chloride 250 mL (10/09/20 0506)  . sodium chloride    . famotidine (PEPCID) IV    . fentaNYL infusion INTRAVENOUS    . meropenem (MERREM) IV 1 g (10/09/20 2226)  . propofol (DIPRIVAN) infusion    . sodium chloride     PRN Meds:.acetaminophen, docusate sodium, fentaNYL, fentaNYL (SUBLIMAZE) injection, hydrALAZINE, ipratropium-albuterol, labetalol, midazolam, midazolam, polyethylene glycol, sodium chloride, sodium chloride flush, sodium chloride flush Past Medical History:  Diagnosis Date  . Adenomatous colon polyp   .  Depression   . Ear drum perforation    left x2   . GERD (gastroesophageal reflux disease)   . Hyperlipidemia   . Hypertension   . T8 vertebral fracture [facility])    Past Surgical History:  Procedure Laterality Date  . CARPAL TUNNEL RELEASE     left hand  . COLONOSCOPY  09/18/2009, 09/15/2014  . COLONOSCOPY WITH PROPOFOL N/A 12/29/2017   Procedure: COLONOSCOPY WITH PROPOFOL;  Surgeon: Manya Silvas, MD;  Location: [facility] ENDOSCOPY;  Service: Endoscopy;  Laterality: N/A;  . EYE SURGERY    . FRACTURE SURGERY    . HERNIA REPAIR    . LIPOMA EXCISION    . SPINE SURGERY     Social History   Socioeconomic History  . Marital status: Single    Spouse name: Not on file  . Number of children: Not on file  . Years of education: Not on file  . Highest education level: Not on file  Occupational History  . Not on file  Tobacco Use  . Smoking status: Never Smoker  . Smokeless tobacco: Never Used  Vaping Use  . Vaping Use: Never used  Substance and Sexual Activity  . Alcohol use: Never  . Drug use: Never  . Sexual activity: Not Currently    Partners: Female    Birth control/protection: None  Other Topics Concern  . Not on file  Social History Narrative  . Not on file   Social Determinants of Health   Financial Resource Strain: Not on file  Food Insecurity: Not on file  Transportation Needs: Not on file  Physical Activity: Not on file  Stress: Not on file  Social Connections: Not on file  Intimate Partner Violence: Not on file   Allergies  Allergen Reactions  . Divalproex Sodium Other (See Comments)    Elevated liver enzymes  . Valproic Acid     unknown    Last set of Vital Signs (not current) Vitals:   10/09/20 2100 10/10/20 0246  BP: (!) 141/86 (!) 147/94  Pulse: 98 99  Resp: 19 19  Temp: 98.2 F (36.8 C) 99.4 F (37.4 C)  SpO2: 95% 96%      Physical Exam  Gen: unresponsive Cardiovascular: RRR Resp: apneic. Coarse breath sounds bilaterally Abd:  nondistended  Neuro: GCS 4 (E1V1M2)  HEENT: No blood in posterior pharynx, lots of oral secretions Neck: No crepitus  Musculoskeletal: No deformity  Skin: warm  Procedures  INTUBATION Performed by: Rudene Re Required items: required blood products, implants, devices, and special equipment available Patient identity confirmed: provided demographic data and hospital-assigned identification number Time out: Immediately prior to procedure a "time out" was called to verify the correct patient, procedure, equipment, support staff and site/side marked as required. Indications: inability to protect airway, unresponsive Intubation method: video laryngoscopy Preoxygenation: BVM Sedatives: etomidate Paralytic: succinylcholine Tube Size: 7.5 cuffed Post-procedure assessment: chest rise and ETCO2 monitor Breath sounds: equal and absent over the epigastrium Tube secured by Respiratory Therapy Patient tolerated the procedure well with no immediate complications.  CRITICAL CARE Performed by: Rudene Re Total critical care time: 30 min Critical care time was exclusive of separately billable procedures and treating other patients. Critical care was necessary to treat or prevent imminent or life-threatening deterioration. Critical care was time spent personally by me on the following activities: development of treatment plan with patient and/or surrogate as well as nursing, discussions with consultants, evaluation of patient's response to treatment, examination of patient, obtaining history from patient or surrogate, ordering and performing treatments and interventions, ordering and review of laboratory studies, ordering and review of radiographic studies, pulse oximetry and re-evaluation of patient's condition.  Assessment and Plan  36M full code admitted for septic shock, PNA, respiratory failure, extubated on 10/03/20 now unresponsive, snoring. Sats in the lower 90s on 5L, breathing  spontaneously, patient with normal pulse, clammy and hot to the touch. Unable to protect airway, GCS 4 with lots of oral secretions. Patient transferred to ICU where he was intubated by me per procedure note above. Patient never lost pulses. Care transitioned to ICU NP Darlyn Chamber in stable but critical conditions.     Rudene Re, MD 10/10/20 [phone]

## 2020-10-10 NOTE — Progress Notes (Signed)
PT Cancellation Note  Patient Details Name: Eddie Chapman MRN: [id number] DOB: June 20, 1961   Cancelled Treatment:    Reason Eval/Treat Not Completed: Medical issues which prohibited therapy (Per chart review, patient noted with transfer to CCU s/p CODE BLUE; now intubated/sedated.  Per guidelines, will require new orders to resume rehab services.  Will complete initial order; please re-consult as medically appropriate.)  Lexandra Rettke H. Owens Shark, PT, DPT, NCS 10/10/20, 9:15 AM [phone]

## 2020-10-10 NOTE — Progress Notes (Signed)
Inpatient Rehab Admissions Coordinator:   Note pt with code this morning and pt intubated and sedated.  Will follow for a few more days for improvement/therapy re-evals, otherwise will sign off.   Shann Medal, PT, DPT Admissions Coordinator [phone] 10/10/20  9:45 AM

## 2020-10-10 NOTE — Progress Notes (Signed)
Palliative:  HPI: 60 yo M with hx of cerebral aneurysm, PVD, HFrEF, pulmonary htn, morbid obesity, OSA, GERD, hx of TBI, dyslipidemia who came in after girlfriend found him to be unresponsive. He was found to be severely hypoxemic in circulatory shock with left lung infiltrate on CXR. Labwork consistent with sepsis.  I met today at Eddie Chapman' bedside. He is sedated on vent. He is able to nod head yes/no. Unable to participate in goals discussion. I discussed with his significant other/HCPOA, Eddie Chapman, at bedside. Eddie Chapman shares that they have had discussions and that he would never want prolong life support or tracheostomy but otherwise open to interventions. We certainly "don't want to pull the plug now." I explained to her that this is not what this conversation is about as I am hopeful that he will be able to come off ventilator successfully but I do worry about his overall prognosis and quality of life after prolonged illness. Eddie Chapman has good understanding. She shares that she feels that Eddie Chapman would want current level of care. She reports that if major decisions needed to be made she would want Eddie Chapman father to take lead but they do speak and work together when it comes to caring for Eddie Chapman.   She shares that she did feel that he was improving and getting to the point of discussing going to CIR. She was not sure he could tolerate that level of intensive therapy but they were considering it as a good option. She felt like he was improving so she is hopeful that he can get back to the point he was at 2 days ago. She understands that he is not going to return to his previous baseline but is hopeful that he can ultimately return home. She feels that he has learned a lesson to be more compliant with CPAP and taking better care of himself. She also reports that from their discussions they agreed that they can work with whatever life they get as long as they are alive they can make it work. With this  in mind we will continue aggressive care. Hopefully we can have opportunity to discuss further with Eddie Chapman himself in the near future.   All questions/concerns addressed. Emotional support provided.   Exam: Sedated on vent but nods appropriately. Hears better in right ear. HR RRR. Tolerating vent at 50% FiO2. Abd soft with NGT output. Concern for ileus but family reports copious diarrhea that has worsened over stay??   Plan: - Continue current level of care.  - Hopeful for improvement and revisit option of CIR.   [place], NP Palliative Medicine Team Pager [phone] (Please see [email or web address] for schedule) Team Phone [phone]    Greater than 50%  of this time was spent counseling and coordinating care related to the above assessment and plan

## 2020-10-10 NOTE — Consult Note (Addendum)
NAME:  Eddie Chapman, MRN:  [id number], DOB:  05-13-1961, LOS: 53 ADMISSION DATE:  09/21/2020, CONSULTATION DATE:  10/10/2020 REFERRING MD:  Dr. Damita Dunnings, CHIEF COMPLAINT:  AMS, Acute Respiratory Distress   Brief History:  [name] admitted with SEVEREAcute hypoxemic respiratory failuredue toCT chestwithimpressive Left lung pneumoniacomplicated byAcute metabolic encephalopathy related to critical illness,  Acute kidney injury due to ATN, associated with morbid obesity. Was extubated and transferred to [place].  On 10/11/19 he is unresponsive with snoring respirations and copious secretions requiring intubation for airway protection following Ativan administration.  History of Present Illness:  60 yo M with hx of cerebral aneurysm, PVD, HFrEF, pulmonary htn, morbid obesity, OSA, GERD, hx of TBI, dyslipidemia who came in after girlfriend found him to be unresponsive. He was found to be severely hypoxemic in circulatory shock with left lung infiltrate on CXR. Labwork consistent with sepsis.  Past Medical History:  Hypertension Hyperlipidemia GERD Depression Obstructive sleep apnea Obesity hypoventilation syndrome Chronic HFpEF  Significant Hospital Events:  09/22/20- patient is off vasopressors. He is weaning on FIO2. Family at bedside (girlfriend). Patient is weaned to 40% FIO2. 09/23/20-patient failed SBT today. 09/25/20-patient with continued tenacious secretions and patient also with altered mentation. Started Precedex in hopes to be able to wean Versed. As patient extremely altered and difficult to arouse. CT head and CT chest obtained 09/26/20-Pateint following commands intermittently but agitated.  SBT attempted this morning and failed. Patient was taking TV of 200 on 10/5. RR increased to 40. Placed back on rate and sedaiton increased.  09/27/2020-patient attempted another spontaneous breathing trial. Patient becomes extremely agitated. Patient takes shallow breaths  and has high respiratory rate. 12/30-remains heavily sedated 09/29/2020:Poorly responsive when sedation is lightened, does not tolerate SBT 10/01/2019:Not tolerating SBT, will need goals of care addressed 10/02/2019:More responsive, still unable to tolerate SBT well 10/03/19- failed SBT today, palliative care evaluation in process 10/04/19- patient passed SBT and was extubated to BIPAP. I spoke with patients father and met with GF at bedside for a second update. 1/5 remains extubated, high risk for intubation 1/6 remains on high flow high risk for intubation 1/11: Pt unresponsive with snoring respirations and copious secretions following Benzodiazepines. Transfer to ICU for emergent intubation for airway protection.  Consults:  PCCM  Procedures:  1/11: Endotracheal intubation  Significant Diagnostic Tests:  12/23: CT head>>1. No acute intracranial findings. 2. Chronic right hemispheric encephalomalacia, not appreciably changed from prior CT. 12/27: CT head>>1. No acute intracranial hemorrhage. 2. 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 12/27: CT chest without contrast>>1. Endotracheal tube located at the carina, just proximal to the origin of the left mainstem bronchus. This study demonstrates in expiratory phase of respiration, therefore recommend retraction by no more than 2 cm. 2. Left lower lobe consolidation with associated bilateral trace pleural effusions. Associated 1 cm precarinal lymph node that is likely reactive in etiology. No definite abscess formation identified; however, limited evaluation on this noncontrast study. An underlying pulmonary mass cannot be excluded. Followup PA and lateral chest X-ray is recommended in 3-4 weeks following therapy to ensure resolution and exclude underlying malignancy. 3. A indeterminate 3.5 cm right adrenal gland mass. Recommend further evaluation with dedicated CT adrenal protocol. 4.  Suggestion of an ectatic ascending aorta with limited evaluation on this noncontrast study. Aortic aneurysm NOS (ICD10-I71.9). Aortic Atherosclerosis (ICD10-I70.0). 5. Other imaging findings of potential clinical significance: At least mild four-vessel coronary artery calcifications. Cardiomegaly. Multilevel mid to lower thoracic chronic  appearing compression fractures with greater than 80% height loss of the T9 vertebral body. 12/29: Venous ultrasound bilateral lower extremities>>Sonographic survey of the bilateral lower extremities negative for DVT 12/30: CT head without contrast>>Right frontal and parietal encephalomalacia consistent with old infarction. No acute intracranial abnormality seen. 1/11: Chest x-ray>>Satisfactory positioning of the lines and tubes as above. Persistent heterogeneous opacities bilaterally with additional features of pulmonary edema. No acute abnormality within the visualized portions of the abdomen. 1/11: KUB>>Abdominal radiograph only partially encompasses the abdomen. No suspicious calcifications or high-grade obstructive bowel gas pattern. No visible subdiaphragmatic free air.  Micro Data:  12/23: SARS-CoV-2 PCR>> negative 12/23: Influenza AMB PCR>> negative 12/24: HIV screen>> nonreactive 12/23: Blood cultures x2>> no growth 12/23: Sputum culture>> normal respiratory flora 12/23: MRSA PCR>> negative 12/28: Blood culture>>Methicillin (oxacillin) resistant coagulase negative staphylococcus (questionable contaminant) 12/28: Repeat blood culture>> no growth 1/11: Tracheal aspirate>>  Antimicrobials:  Cefepime 12/23>> 12/23 Zosyn 12/23>> 12/30 Vancomycin 12/23>> 12/23; 1/5>> 1/7 Meropenem 1/5>>  Interim History / Subjective:  -Patient previously received Ativan on MedSurg unit and was then found by nursing minimally responsive with snoring respirations and copious secretions -Transferred to ICU for emergent intubation for airway protection -Patient  currently sedated after receiving induction medications for intubation -Temperature 99.4 -Hemodynamically stable -Morning labs reassuring  Objective   Blood pressure (!) 147/94, pulse 99, temperature 99.4 F (37.4 C), temperature source Oral, resp. rate 19, height '5\' 10"'  (1.778 m), weight (!) 137.4 kg, SpO2 96 %.        Intake/Output Summary (Last 24 hours) at 10/10/2020 0436 Last data filed at 10/09/2020 1539 Gross per 24 hour  Intake 678.91 ml  Output 800 ml  Net -121.09 ml   Filed Weights   10/07/20 0500 10/08/20 0300 10/09/20 0145  Weight: (!) 138.4 kg (!) 139.3 kg (!) 137.4 kg    Examination: General: Critically ill-appearing male, laying in bed, intubated sedated, no acute distress HENT: Atraumatic, normocephalic, neck supple, no JVD Lungs: Coarse breath sounds throughout, synchronous with ventilator, even Cardiovascular: Regular rate and rhythm, S1-S2, no murmurs, rubs, gallop Abdomen: Morbidly obese, soft, nontender, no guarding or rebound tenderness, bowel sounds positive x4 Extremities: No deformities, no significant cyanosis, clubbing, or edema to bilateral lower extremity Neuro: Currently sedated, starting to withdraw from pain, pupils PERRLA Skin: Warm and dry.  No obvious rashes, lesions, ulcerations  Resolved Hospital Problem list   AKI  Assessment & Plan:   Acute hypoxic and hypercapnic respiratory failure in the setting of left lung pneumonia Intubated for airway protection on 1/11 following administration of benzodiazepines HX: OSA/OHS -Full vent support -Wean FiO2 and PEEP as tolerated to maintain O2 sats greater than 92% -Follow intermittent chest x-ray and ABG as needed -VAP bundle implemented -Spontaneous breathing trials when respiratory parameters met and mental status permits -Continue Meropenem -Diuresis as BP and renal function Permits   Acute on chronic HFpEF HX: Hypertension -Continuous cardiac monitoring -Maintain MAP greater than  65 -Vasopressors if needed to maintain MAP goal -Diuresis as renal function and blood pressure permits -Echocardiogram on 12/24 w/ LVEF 50 to 55% -High sensitivityTroponin 19   Left Pneumonia -Monitor fever curve -Trend WBC's -Follow cultures as above -Will repeat Tracheal aspirate -Continue Meropenem   Unresponsive, likely secondary to benzodiazepine administration Acute metabolic encephalopathy Sedation needs in the setting of mechanical ventilation -Fentanyl and propofol infusions to maintain a RASS of -1 to -2 -Avoid sedating meds as able -Daily wake up assessment -Provide supportive care -Patient has had numerous head CTs  this admission all which are negative for acute intracranial pathology -Consider repeat CT if mentation not improved during WUA   Best practice (evaluated daily)  Diet: N.p.o. Pain/Anxiety/Delirium protocol (if indicated): Fentanyl and propofol drips VAP protocol (if indicated): Yes DVT prophylaxis: Lovenox GI prophylaxis: Pepcid Glucose control: Sliding scale insulin Mobility: Bedrest Disposition: ICU  Goals of Care:  Last date of multidisciplinary goals of care discussion: N/A Family and staff present: Updated RN at bedside 10/10/2020.  No family present Summary of discussion: N/A Follow up goals of care discussion due: 10/11/2020 Code Status: Full code  Labs   CBC: Recent Labs  Lab 10/05/20 0720 10/06/20 0553 10/07/20 0711 10/08/20 0418 10/09/20 0321  WBC 11.0* 9.9 9.2 8.4 8.0  NEUTROABS 8.6* 7.9* 7.3 6.4 6.0  HGB 16.0 16.3 16.0 15.8 15.0  HCT 52.2* 53.4* 51.8 52.2* 48.6  MCV 96.7 98.7 98.3 98.3 95.7  PLT 242 216 216 200 102    Basic Metabolic Panel: Recent Labs  Lab 10/05/20 0720 10/06/20 0553 10/07/20 0711 10/08/20 0418 10/08/20 1457 10/09/20 0321  NA 150* 152* 146* 144  --  142  K 2.7* 2.6* 3.2* 3.2* 4.3 3.1*  CL 101 104 102 101  --  95*  CO2 39* 37* 36* 36*  --  36*  GLUCOSE 138* 117* 116* 119*  --  109*  BUN 31*  31* 24* 21  --  18  CREATININE 0.74 0.62 0.48* 0.40*  --  0.52*  CALCIUM 9.2 9.3 8.8* 8.8*  --  8.9  MG 2.0 2.2 2.1 2.0 2.6* 1.7  PHOS 3.0 3.2 2.7 2.6  --  2.0*   GFR: Estimated Creatinine Clearance: 135.5 mL/min (A) (by C-G formula based on SCr of 0.52 mg/dL (L)). Recent Labs  Lab 10/06/20 0553 10/07/20 0711 10/08/20 0418 10/09/20 0321  WBC 9.9 9.2 8.4 8.0  LATICACIDVEN  --   --  0.9  --     Liver Function Tests: Recent Labs  Lab 10/05/20 0720 10/06/20 0553 10/07/20 0711 10/08/20 0418 10/09/20 0321  AST  --   --   --  35 46*  ALT  --   --   --  56* 59*  ALKPHOS  --   --   --  39 34*  BILITOT  --   --   --  0.8 0.9  PROT  --   --   --  6.8 6.5  ALBUMIN 2.9* 2.9* 2.7* 2.9* 2.9*   No results for input(s): LIPASE, AMYLASE in the last 168 hours. No results for input(s): AMMONIA in the last 168 hours.  ABG    Component Value Date/Time   PHART 7.44 10/10/2020 0402   PCO2ART 64 (H) 10/10/2020 0402   PO2ART 71 (L) 10/10/2020 0402   HCO3 43.5 (H) 10/10/2020 0402   O2SAT 94.6 10/10/2020 0402     Coagulation Profile: No results for input(s): INR, PROTIME in the last 168 hours.  Cardiac Enzymes: No results for input(s): CKTOTAL, CKMB, CKMBINDEX, TROPONINI in the last 168 hours.  HbA1C: No results found for: HGBA1C  CBG: Recent Labs  Lab 10/09/20 0848 10/09/20 1146 10/09/20 1648 10/09/20 2058 10/10/20 0352  GLUCAP 100* 146* 118* 130* 118*    Review of Systems:   Unable to assess due to intubation, sedation, and critical illness  Past Medical History:  He,  has a past medical history of Adenomatous colon polyp, Depression, Ear drum perforation, GERD (gastroesophageal reflux disease), Hyperlipidemia, Hypertension, and T8 vertebral fracture ([place]).   Surgical History:  Past Surgical History:  Procedure Laterality Date  . CARPAL TUNNEL RELEASE     left hand  . COLONOSCOPY  09/18/2009, 09/15/2014  . COLONOSCOPY WITH PROPOFOL N/A 12/29/2017   Procedure:  COLONOSCOPY WITH PROPOFOL;  Surgeon: Manya Silvas, MD;  Location: [facility] ENDOSCOPY;  Service: Endoscopy;  Laterality: N/A;  . EYE SURGERY    . FRACTURE SURGERY    . HERNIA REPAIR    . LIPOMA EXCISION    . SPINE SURGERY       Social History:   reports that he has never smoked. He has never used smokeless tobacco. He reports that he does not drink alcohol and does not use drugs.   Family History:  His family history includes Cancer in his mother; Varicose Veins in his mother.   Allergies Allergies  Allergen Reactions  . Divalproex Sodium Other (See Comments)    Elevated liver enzymes  . Valproic Acid     unknown     Home Medications  Prior to Admission medications   Medication Sig Start Date End Date Taking? Authorizing Provider  HYDROcodone-acetaminophen (NORCO/VICODIN) 5-325 MG tablet  07/31/16  Yes [provider]  lovastatin (MEVACOR) 40 MG tablet 40 mg. 2 tablets at bedtime 08/01/16  Yes [provider]  metoprolol succinate (TOPROL-XL) 100 MG 24 hr tablet Take 100 mg by mouth daily. 07/23/20  Yes [provider]  omeprazole (PRILOSEC) 20 MG capsule Take by mouth.   Yes [provider]  spironolactone (ALDACTONE) 25 MG tablet Take 25 mg by mouth daily. 08/31/20  Yes [provider]  telmisartan (MICARDIS) 20 MG tablet Take 20 mg by mouth daily. 07/07/20  Yes [provider]  aspirin EC 81 MG tablet Take 162 mg by mouth. Patient not taking: Reported on 09/21/2020    [provider]  fluticasone (FLONASE) 50 MCG/ACT nasal spray Place into the nose. Patient not taking: Reported on 09/21/2020    [provider]  meclizine (ANTIVERT) 25 MG tablet Take 25 mg by mouth 3 (three) times daily as needed for dizziness.    [provider]  propranolol (INDERAL) 40 MG tablet Take 20 mg by mouth 2 (two) times daily.  Patient not taking: Reported on 09/21/2020 08/17/15   [provider]   sacubitril-valsartan (ENTRESTO) 24-26 MG Take 1 tablet by mouth 2 (two) times daily.    [provider]  traZODone (DESYREL) 50 MG tablet Take by mouth. 11/29/15 12/29/17  [provider]     Critical care time: 50 minutes       Darel Hong, AGACNP-BC [facility] Pager: [phone]

## 2020-10-10 NOTE — Progress Notes (Signed)
Patient pulled  his Midline  out. and he's  getting IV ABT and Potassium runs. Notified Dr. Damita Dunnings and awaiting a new order.

## 2020-10-10 NOTE — Progress Notes (Signed)
Nutrition Follow-up  DOCUMENTATION CODES:   Morbid obesity  INTERVENTION:  Once able to resume tube feeds recommend: -Initiate Vital High Protein at 20 mL/hr and advance by 15 mL/hr every 8 hours to goal rate of 50 mL/hr (1200 ml goal daily volume) per tube -Provide PROSource TF 90 mL QID per tube -Goal regimen provides 1520 kcal, 193 grams of protein, 1008 mL H2O daily. With current propofol rate provides 2172 kcal daily.  NUTRITION DIAGNOSIS:   Increased nutrient needs related to catabolic illness (heart failure) as evidenced by estimated needs.  Ongoing.  GOAL:   Patient will meet greater than or equal to 90% of their needs  Not met.  MONITOR:   PO intake,Supplement acceptance,Diet advancement,Labs,Weight trends,I & O's  REASON FOR ASSESSMENT:   Ventilator,Consult Enteral/tube feeding initiation and management  ASSESSMENT:   60 year old male with PMHx of cerebral aneurysm, TBI, HLD, HTN, depression, GERD, HFrEF, OSA admitted with severe hypoxemia, circulatory shock, left lung consolidated PNA.  12/23 intubated 1/4 extubated and diet advanced to dysphagia 1 with nectar-thick liquids 1/7 diet advanced to dysphagia 1 with thin liquids 1/8 transferred out of ICU to PCU 1/11 transferred back to ICU and reintubated  Patient is currently intubated on ventilator support MV: 7.9 L/min Temp (24hrs), Avg:99 F (37.2 C), Min:98.2 F (36.8 C), Max:99.5 F (37.5 C)  Propofol: 24.7 ml/hr (652 kcal daily)  Medications reviewed and include: Colace 200 mg BID, Novolog 0-15 units TID, Novolog 0-5 units QHs, MVI daily, Protonix, Miralax, fentanyl gtt, meropenem, norepinephrine gtt at 10 mcg/min, propofol gtt.  Labs reviewed: CBG 92-121, Chloride 97, CO2 37, Creatinine 0.46.  I/O: 520 mL UOP yesterday + 3 occurrences unmeasured UOP  Weight trend: 137.4 kg on 1/11; -8.5 kg from 12/23; pt has been diuresed during admission and is -6305 mL just since 12/28  Enteral Access: 18  Fr. OGT placed 1/11; terminates in left upper quadrant distal to GE junction per abdominal x-ray 1/11  Discussed with RN and on rounds. Patient with possible ileus. Plan is to hold tube feeds for now.  Diet Order:   Diet Order            Diet NPO time specified  Diet effective now                EDUCATION NEEDS:   No education needs have been identified at this time  Skin:  Skin Assessment: Reviewed RN Assessment  Last BM:  10/09/2020 - medium type 7  Height:   Ht Readings from Last 1 Encounters:  10/10/20 _0  (1.778 m)   Weight:   Wt Readings from Last 1 Encounters:  10/10/20 (!) 137.4 kg   Ideal Body Weight:  75.5 kg  BMI:  Body mass index is 43.46 kg/m.  Estimated Nutritional Needs:   Kcal:  9678-9381 (11-14 kcal/kg)  Protein:  up to 189 grams (2.5 grams/kg IBW)  Fluid:  2-2.3 L/day  Jacklynn Barnacle, MS, RD, LDN Pager number available on Amion

## 2020-10-10 NOTE — Progress Notes (Signed)
Peripherally Inserted Central Catheter Placement  The IV Nurse has discussed with the patient and/or persons authorized to consent for the patient, the purpose of this procedure and the potential benefits and risks involved with this procedure.  The benefits include less needle sticks, lab draws from the catheter, and the patient may be discharged home with the catheter. Risks include, but not limited to, infection, bleeding, blood clot (thrombus formation), and puncture of an artery; nerve damage and irregular heartbeat and possibility to perform a PICC exchange if needed/ordered by physician.  Alternatives to this procedure were also discussed.  Bard Power PICC patient education guide, fact sheet on infection prevention and patient information card has been provided to patient /or left at bedside.    PICC Placement Documentation  PICC Triple Lumen 10/10/20 PICC Right Brachial 46 cm 0 cm (Active)  Indication for Insertion or Continuance of Line Prolonged intravenous therapies 10/10/20 1543  Exposed Catheter (cm) 0 cm 10/10/20 1543  Site Assessment Clean;Dry;Intact 10/10/20 1543  Lumen #1 Status Flushed;Blood return noted 10/10/20 1543  Lumen #2 Status Flushed;Blood return noted 10/10/20 1543  Lumen #3 Status Flushed;Blood return noted 10/10/20 1543  Dressing Type Transparent 10/10/20 1543  Dressing Status Clean;Dry;Intact 10/10/20 1543  Antimicrobial disc in place? Yes 10/10/20 1543  Dressing Intervention New dressing;Other (Comment) 10/10/20 1543   Telephone consent signed by father    Synthia Innocent 10/10/2020, 3:45 PM

## 2020-10-10 NOTE — Progress Notes (Signed)
OT Cancellation Note  Patient Details Name: Eddie Chapman MRN: [id number] DOB: 1960/12/30   Cancelled Treatment:    Reason Eval/Treat Not Completed: Medical issues which prohibited therapy. Pt with functional decline after being found unresponsive and code blue called. Pt is placed back on vent at this time. OT to SIGN OFF. Please re consult once pt is medically appropriate to participate in therapeutic intervention.  Darleen Crocker, [place], OTR/L , CBIS ascom [phone]  10/10/20, 8:38 AM   10/10/2020, 8:37 AM

## 2020-10-11 DIAGNOSIS — I5031 Acute diastolic (congestive) heart failure: Secondary | ICD-10-CM | POA: Diagnosis not present

## 2020-10-11 DIAGNOSIS — G9341 Metabolic encephalopathy: Secondary | ICD-10-CM

## 2020-10-11 DIAGNOSIS — K920 Hematemesis: Secondary | ICD-10-CM

## 2020-10-11 DIAGNOSIS — J9602 Acute respiratory failure with hypercapnia: Secondary | ICD-10-CM | POA: Diagnosis not present

## 2020-10-11 DIAGNOSIS — J9601 Acute respiratory failure with hypoxia: Secondary | ICD-10-CM | POA: Diagnosis not present

## 2020-10-11 LAB — CBC WITH DIFFERENTIAL/PLATELET
Abs Immature Granulocytes: 0.04 10*3/uL (ref 0.00–0.07)
Basophils Absolute: 0 10*3/uL (ref 0.0–0.1)
Basophils Relative: 1 %
Eosinophils Absolute: 0.1 10*3/uL (ref 0.0–0.5)
Eosinophils Relative: 2 %
HCT: 41.8 % (ref 39.0–52.0)
Hemoglobin: 13.8 g/dL (ref 13.0–17.0)
Immature Granulocytes: 1 %
Lymphocytes Relative: 17 %
Lymphs Abs: 1.4 10*3/uL (ref 0.7–4.0)
MCH: 30.9 pg (ref 26.0–34.0)
MCHC: 33 g/dL (ref 30.0–36.0)
MCV: 93.7 fL (ref 80.0–100.0)
Monocytes Absolute: 1.1 10*3/uL — ABNORMAL HIGH (ref 0.1–1.0)
Monocytes Relative: 14 %
Neutro Abs: 5.3 10*3/uL (ref 1.7–7.7)
Neutrophils Relative %: 65 %
Platelets: 186 10*3/uL (ref 150–400)
RBC: 4.46 MIL/uL (ref 4.22–5.81)
RDW: 13 % (ref 11.5–15.5)
WBC: 8 10*3/uL (ref 4.0–10.5)
nRBC: 0 % (ref 0.0–0.2)

## 2020-10-11 LAB — COMPREHENSIVE METABOLIC PANEL
ALT: 64 U/L — ABNORMAL HIGH (ref 0–44)
AST: 45 U/L — ABNORMAL HIGH (ref 15–41)
Albumin: 2.5 g/dL — ABNORMAL LOW (ref 3.5–5.0)
Alkaline Phosphatase: 34 U/L — ABNORMAL LOW (ref 38–126)
Anion gap: 20 — ABNORMAL HIGH (ref 5–15)
BUN: 23 mg/dL (ref 8–23)
CO2: 25 mmol/L (ref 22–32)
Calcium: 8 mg/dL — ABNORMAL LOW (ref 8.9–10.3)
Chloride: 86 mmol/L — ABNORMAL LOW (ref 98–111)
Creatinine, Ser: 0.61 mg/dL (ref 0.61–1.24)
GFR, Estimated: >60 mL/min (ref >60)
Glucose, Bld: 255 mg/dL — ABNORMAL HIGH (ref 70–99)
Potassium: 3 mmol/L — ABNORMAL LOW (ref 3.5–5.1)
Sodium: 131 mmol/L — ABNORMAL LOW (ref 135–145)
Total Bilirubin: 2.5 mg/dL — ABNORMAL HIGH (ref 0.3–1.2)
Total Protein: 5.4 g/dL — ABNORMAL LOW (ref 6.5–8.1)

## 2020-10-11 LAB — PHOSPHORUS: Phosphorus: 2.6 mg/dL (ref 2.5–4.6)

## 2020-10-11 LAB — GLUCOSE, CAPILLARY
Glucose-Capillary: 102 mg/dL — ABNORMAL HIGH (ref 70–99)
Glucose-Capillary: 104 mg/dL — ABNORMAL HIGH (ref 70–99)
Glucose-Capillary: 109 mg/dL — ABNORMAL HIGH (ref 70–99)
Glucose-Capillary: 82 mg/dL (ref 70–99)
Glucose-Capillary: 83 mg/dL (ref 70–99)
Glucose-Capillary: 92 mg/dL (ref 70–99)
Glucose-Capillary: 99 mg/dL (ref 70–99)

## 2020-10-11 LAB — TRIGLYCERIDES
Triglycerides: 1940 mg/dL — ABNORMAL HIGH (ref <150)
Triglycerides: 234 mg/dL — ABNORMAL HIGH (ref <150)

## 2020-10-11 LAB — TROPONIN I (HIGH SENSITIVITY): Troponin I (High Sensitivity): 12 ng/L (ref <18)

## 2020-10-11 LAB — MAGNESIUM: Magnesium: 1.7 mg/dL (ref 1.7–2.4)

## 2020-10-11 MED ORDER — IPRATROPIUM-ALBUTEROL 0.5-2.5 (3) MG/3ML IN SOLN
3.0000 mL | Freq: Four times a day (QID) | RESPIRATORY_TRACT | Status: DC
  Administered 2020-10-11 – 2020-10-13 (×5): 3 mL via RESPIRATORY_TRACT
  Filled 2020-10-11 (×5): qty 3

## 2020-10-11 MED ORDER — MAGNESIUM SULFATE 2 GM/50ML IV SOLN
2.0000 g | Freq: Once | INTRAVENOUS | Status: AC
  Administered 2020-10-11: 2 g via INTRAVENOUS
  Filled 2020-10-11: qty 50

## 2020-10-11 MED ORDER — POTASSIUM CHLORIDE 20 MEQ PO PACK
40.0000 meq | PACK | Freq: Four times a day (QID) | ORAL | Status: AC
  Administered 2020-10-11 (×2): 40 meq
  Filled 2020-10-11 (×2): qty 2

## 2020-10-11 MED ORDER — PIPERACILLIN-TAZOBACTAM 3.375 G IVPB
3.3750 g | Freq: Three times a day (TID) | INTRAVENOUS | Status: AC
  Administered 2020-10-11 – 2020-10-16 (×15): 3.375 g via INTRAVENOUS
  Filled 2020-10-11 (×16): qty 50

## 2020-10-11 NOTE — Plan of Care (Signed)
  Problem: Coping: Goal: Level of anxiety will decrease Outcome: Progressing   Problem: Elimination: Goal: Will not experience complications related to urinary retention Outcome: Progressing   Problem: Pain Managment: Goal: General experience of comfort will improve Outcome: Progressing   

## 2020-10-11 NOTE — Plan of Care (Signed)
Patient has remained on ventilator this shift. Will arouse to voice, follows commands. Remains on levo for blood pressure support.

## 2020-10-11 NOTE — Progress Notes (Signed)
CRITICAL CARE NOTE [name] admitted with SEVEREAcute hypoxemic respiratory failuredue toCT chestwithimpressive Left lung pneumoniacomplicated byAcute metabolic encephalopathy related to critical illness,  Acute kidney injury due to ATN, associated withmorbidobesity. Was extubated and transferred to [place].  On 10/11/19 he is unresponsive with snoring respirations and copious secretions requiring intubation for airway protection following Ativan administration.  History of Present Illness:  59 yo M with hx of cerebral aneurysm, PVD, HFrEF, pulmonary htn, morbid obesity, OSA, GERD, hx of TBI, dyslipidemia who came in after girlfriend found him to be unresponsive. He was found to be severely hypoxemic in circulatory shock with left lung infiltrate on CXR. Labwork consistent with sepsis.   Significant Hospital Events:  09/22/20- patient is off vasopressors. He is weaning on FIO2. Family at bedside (girlfriend). Patient is weaned to 40% FIO2. 09/23/20-patient failed SBT today. 09/25/20-patient with continued tenacious secretions and patient also with altered mentation. Started Precedex in hopes to be able to wean Versed. As patient extremely altered and difficult to arouse. CT head and CT chest obtained 09/26/20-Pateint following commands intermittently but agitated.  SBT attempted this morning and failed. Patient was taking TV of 200 on 10/5. RR increased to 40. Placed back on rate and sedaiton increased.  09/27/2020-patient attempted another spontaneous breathing trial. Patient becomes extremely agitated. Patient takes shallow breaths and has high respiratory rate. 12/30-remains heavily sedated 09/29/2020:Poorly responsive when sedation is lightened, does not tolerate SBT 10/01/2019:Not tolerating SBT, will need goals of care addressed 10/02/2019:More responsive, still unable to tolerate SBT well 10/03/19- failed SBT today, palliative care evaluation in process 10/04/19-  patient passed SBT and was extubated to BIPAP. I spoke with patients father and met with GF at bedside for a second update. 1/5 remains extubated, high risk for intubation 1/6 remains on high flow high risk for intubation 1/11: Pt unresponsive with snoring respirations and copious secretions following Benzodiazepines. Transfer to ICU for emergent intubation for airway protection. 1/12 severe resp failure, bloody contents from OGT  Consults:  PCCM  Procedures:  1/11: Endotracheal intubation  Significant Diagnostic Tests:  12/23: CT head>>1. No acute intracranial findings. 2. Chronic right hemispheric encephalomalacia, not appreciably changed from prior CT. 12/27: CT head>>1. No acute intracranial hemorrhage. 2. 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 12/27: CT chest without contrast>>1. Endotracheal tube located at the carina, just proximal to the origin of the left mainstem bronchus. This study demonstrates in expiratory phase of respiration, therefore recommend retraction by no more than 2 cm. 2. Left lower lobe consolidation with associated bilateral trace pleural effusions. Associated 1 cm precarinal lymph node that is likely reactive in etiology. No definite abscess formation identified; however, limited evaluation on this noncontrast study. An underlying pulmonary mass cannot be excluded. Followup PA and lateral chest X-ray is recommended in 3-4 weeks following therapy to ensure resolution and exclude underlying malignancy. 3. A indeterminate 3.5 cm right adrenal gland mass. Recommend further evaluation with dedicated CT adrenal protocol. 4. Suggestion of an ectatic ascending aorta with limited evaluation on this noncontrast study. Aortic aneurysm NOS (ICD10-I71.9). Aortic Atherosclerosis (ICD10-I70.0). 5. Other imaging findings of potential clinical significance: At least mild four-vessel coronary artery calcifications.  Cardiomegaly. Multilevel mid to lower thoracic chronic appearing compression fractures with greater than 80% height loss of the T9 vertebral body. 12/29: Venous ultrasound bilateral lower extremities>>Sonographic survey of the bilateral lower extremities negative for DVT 12/30: CT head without contrast>>Right frontal and parietal encephalomalacia consistent with old infarction. No acute intracranial abnormality  seen. 1/11: Chest x-ray>>Satisfactory positioning of the lines and tubes as above. Persistent heterogeneous opacities bilaterally with additional features of pulmonary edema. No acute abnormality within the visualized portions of the abdomen. 1/11: KUB>>Abdominal radiograph only partially encompasses the abdomen. No suspicious calcifications or high-grade obstructive bowel gas pattern. No visible subdiaphragmatic free air.  Micro Data:  12/23: SARS-CoV-2 PCR>> negative 12/23: Influenza AMB PCR>> negative 12/24: HIV screen>> nonreactive 12/23: Blood cultures x2>> no growth 12/23: Sputum culture>> normal respiratory flora 12/23: MRSA PCR>> negative 12/28: Blood culture>>Methicillin (oxacillin) resistant coagulase negative staphylococcus (questionable contaminant) 12/28: Repeat blood culture>> no growth 1/11: Tracheal aspirate>>  Antimicrobials:  Cefepime 12/23>> 12/23 Zosyn 12/23>> 12/30 Vancomycin 12/23>> 12/23; 1/5>> 1/7 Meropenem 1/5>>     CC  follow up respiratory failure  SUBJECTIVE Patient remains critically ill Prognosis is guarded   BP 98/75   Pulse 91   Temp 99.2 F (37.3 C) (Oral)   Resp 16   Ht _0  (1.778 m)   Wt 134.2 kg   SpO2 95%   BMI 42.45 kg/m    I/O last 3 completed shifts: In: 2101.3 [I.V.:1792; IV Piggyback:309.4] Out: 2525 [Urine:565; Emesis/NG output:1960] No intake/output data recorded.  SpO2: 95 % O2 Flow Rate (L/min): 6 L/min FiO2 (%): 40 %  Estimated body mass index is 42.45 kg/m as calculated from the following:    Height as of this encounter: _1  (1.778 m).   Weight as of this encounter: 134.2 kg.  SIGNIFICANT EVENTS   REVIEW OF SYSTEMS  PATIENT IS UNABLE TO PROVIDE COMPLETE REVIEW OF SYSTEMS DUE TO SEVERE CRITICAL ILLNESS        PHYSICAL EXAMINATION:  GENERAL:critically ill appearing, +resp distress HEAD: Normocephalic, atraumatic.  EYES: Pupils equal, round, reactive to light.  No scleral icterus.  MOUTH: Moist mucosal membrane. NECK: Supple.  PULMONARY: +rhonchi, +wheezing CARDIOVASCULAR: S1 and S2. Regular rate and rhythm. No murmurs, rubs, or gallops.  GASTROINTESTINAL: Soft, nontender, -distended.  Positive bowel sounds.   MUSCULOSKELETAL: No swelling, clubbing, or edema.  NEUROLOGIC: obtunded, GCS<8 SKIN:intact,warm,dry  MEDICATIONS: I have reviewed all medications and confirmed regimen as documented   CULTURE RESULTS   No results found for this or any previous visit (from the past 240 hour(s)).        IMAGING    No results found.   Nutrition Status: Nutrition Problem: Increased nutrient needs Etiology: catabolic illness (heart failure) Signs/Symptoms: estimated needs Interventions: Refer to RD note for recommendations     Indwelling Urinary Catheter continued, requirement due to   Reason to continue Indwelling Urinary Catheter strict Intake/Output monitoring for hemodynamic instability   Central Line/ continued, requirement due to  Reason to continue [place] of central venous pressure or other hemodynamic parameters and poor IV access   Ventilator continued, requirement due to severe respiratory failure   Ventilator Sedation RASS 0 to -2      ASSESSMENT AND PLAN SYNOPSIS  60 yo morbidly obese white male with recurrent boust of resp  failure requiring MV support Acute hypoxic and hypercapnic respiratory failure in the setting of left lung pneumonia Intubated for airway protection on 1/11 following administration of benzodiazepines  +aspiration pneumonia    Severe ACUTE Hypoxic and Hypercapnic Respiratory Failure -continue Full MV support -continue Bronchodilator Therapy -Wean Fio2 and PEEP as tolerated -will perform SAT/SBT when respiratory parameters are met -VAP/VENT bundle implementation  ACUTE DIASTOLIC CARDIAC FAILURE-  -oxygen as needed -Lasix as tolerated   Morbid obesity, possible OSA.   Will certainly impact  respiratory mechanics, ventilator weaning Suspect will need to consider additional PEEP  ACUTE KIDNEY INJURY/Renal Failure -continue Foley Catheter-assess need -Avoid nephrotoxic agents -Follow urine output, BMP -Ensure adequate renal perfusion, optimize oxygenation -Renal dose medications     NEUROLOGY - intubated and sedated - minimal sedation to achieve a RASS goal: -1 Wake up assessment pending   CARDIAC ICU monitoring  ID -continue IV abx as prescibed -follow up cultures  GI GI PROPHYLAXIS as indicated  DIET-->on hold Constipation protocol as indicated GI consulted for EGD  ENDO - will use ICU hypoglycemic\Hyperglycemia protocol if indicated     ELECTROLYTES -follow labs as needed -replace as needed -pharmacy consultation and following   DVT/GI PRX ordered and assessed TRANSFUSIONS AS NEEDED MONITOR FSBS I Assessed the need for Labs I Assessed the need for Foley I Assessed the need for Central Venous Line Family Discussion when available I Assessed the need for Mobilization I made an Assessment of medications to be adjusted accordingly Safety Risk assessment completed   CASE DISCUSSED IN MULTIDISCIPLINARY ROUNDS WITH ICU TEAM  Critical Care Time devoted to patient care services described in this note is 45 minutes.   Overall, patient is critically ill, prognosis is guarded.  Patient with Multiorgan failure and at high risk for cardiac arrest and death.    Corrin Parker, M.D.  [facility]  Medical Director  [place] Director [facility] Cardio-Pulmonary Department

## 2020-10-11 NOTE — Consult Note (Signed)
Eddie Chapman , MD [address] [place], [place], [place], [address] [address], [place], [place], [place], [address] Phone: [phone]  Fax: [phone]  Consultation  Referring Provider:    Dr. Mortimer Fries  primary Care Physician:  Adin Hector, MD Primary Gastroenterologist: None         Reason for Consultation:     Blood in OG tube  Date of Admission:  09/21/2020 Date of Consultation:  10/11/2020         HPI:   Eddie Chapman is a 60 y.o. male admitted on 09/21/2020 with severe hypoxic respiratory failure, Was found initially to be unresponsive, complicated by shock sepsis likely secondary to pneumonia.  Comorbidities include heart failure, kidney disease, morbid obesity.  He has been on vasopressors and intubated on 09/21/2020 and extubated on 10/03/2020.  Reintubated on 10/10/2020 for hypoxic respiratory failure and airway protection.  I have been contacted for blood noted in the OG tube.  Hemoglobin this morning is 13.8 g which has been stable, no elevation in the BUN/creatinine ratio.  BUN is 23 and creatinine of 0.61.  Chest x-ray from yesterday shows bilaterally infiltrates suggestive of pulmonary edema.  When I went to see the patient he was intubated and sedated .  Unable to provide a history.  Girlfriend was in his room.  I spoke with the nurse taking care of him.  He had about 600 cc of bloody fluid aspirated from his stomach earlier in the day.  Since then the quantity had not changed.  At the bedside we actually injected some saline into his OG tube and aspirated with no blood return noted straw-colored liquid was aspirated.  Past Medical History:  Diagnosis Date  . Adenomatous colon polyp   . Depression   . Ear drum perforation    left x2   . GERD (gastroesophageal reflux disease)   . Hyperlipidemia   . Hypertension   . T8 vertebral fracture [facility])     Past Surgical History:  Procedure Laterality Date  . CARPAL TUNNEL RELEASE     left hand  . COLONOSCOPY   09/18/2009, 09/15/2014  . COLONOSCOPY WITH PROPOFOL N/A 12/29/2017   Procedure: COLONOSCOPY WITH PROPOFOL;  Surgeon: Manya Silvas, MD;  Location: [facility] ENDOSCOPY;  Service: Endoscopy;  Laterality: N/A;  . EYE SURGERY    . FRACTURE SURGERY    . HERNIA REPAIR    . LIPOMA EXCISION    . SPINE SURGERY      Prior to Admission medications   Medication Sig Start Date End Date Taking? Authorizing Provider  HYDROcodone-acetaminophen (NORCO/VICODIN) 5-325 MG tablet  07/31/16  Yes [provider]  lovastatin (MEVACOR) 40 MG tablet 40 mg. 2 tablets at bedtime 08/01/16  Yes [provider]  metoprolol succinate (TOPROL-XL) 100 MG 24 hr tablet Take 100 mg by mouth daily. 07/23/20  Yes [provider]  omeprazole (PRILOSEC) 20 MG capsule Take by mouth.   Yes [provider]  spironolactone (ALDACTONE) 25 MG tablet Take 25 mg by mouth daily. 08/31/20  Yes [provider]  telmisartan (MICARDIS) 20 MG tablet Take 20 mg by mouth daily. 07/07/20  Yes [provider]  aspirin EC 81 MG tablet Take 162 mg by mouth. Patient not taking: Reported on 09/21/2020    [provider]  fluticasone (FLONASE) 50 MCG/ACT nasal spray Place into the nose. Patient not taking: Reported on 09/21/2020    [provider]  meclizine (ANTIVERT) 25 MG tablet Take  25 mg by mouth 3 (three) times daily as needed for dizziness.    [provider]  propranolol (INDERAL) 40 MG tablet Take 20 mg by mouth 2 (two) times daily.  Patient not taking: Reported on 09/21/2020 08/17/15   [provider]  sacubitril-valsartan (ENTRESTO) 24-26 MG Take 1 tablet by mouth 2 (two) times daily.    [provider]  traZODone (DESYREL) 50 MG tablet Take by mouth. 11/29/15 12/29/17  [provider]    Family History  Problem Relation Age of Onset  . Cancer Mother   . Varicose Veins Mother      Social History   Tobacco Use  . Smoking status: Never  Smoker  . Smokeless tobacco: Never Used  Vaping Use  . Vaping Use: Never used  Substance Use Topics  . Alcohol use: Never  . Drug use: Never    Allergies as of 09/21/2020 - Review Complete 09/21/2020  Allergen Reaction Noted  . Divalproex sodium Other (See Comments) 05/13/2014  . Valproic acid  07/17/2015    Review of Systems:    Patient intubated and sedated unable to give any history   Physical Exam:  Vital signs in last 24 hours: Temp:  [98.9 F (37.2 C)-99.5 F (37.5 C)] 99.2 F (37.3 C) (01/12 0400) Pulse Rate:  [66-101] 97 (01/12 0744) Resp:  [14-21] 16 (01/12 0744) BP: (83-130)/(47-86) 98/75 (01/12 0545) SpO2:  [93 %-100 %] 94 % (01/12 0744) FiO2 (%):  [40 %-50 %] 40 % (01/12 0744) Weight:  [134.2 kg] 134.2 kg (01/12 0453) Last BM Date: 10/09/20 General:   Pleasant, cooperative in NAD Head:  Normocephalic and atraumatic. Eyes:   No icterus.   Conjunctiva pink. PERRLA. Ears: Cannot assess Neck:  Supple; no masses or thyroidomegaly Lungs: Decreased air entry bilaterally Heart:  Regular rate and rhythm;  Without murmur, clicks, rubs or gallops Abdomen:  Soft, nondistended, nontender. Normal bowel sounds. No appreciable masses or hepatomegaly.  No rebound or guarding.  Neurologic:  Alert and oriented xo. Psych: Cannot assess  LAB RESULTS: Recent Labs    10/09/20 0321 10/10/20 0415 10/11/20 0451  WBC 8.0 8.5 8.0  HGB 15.0 16.3 13.8  HCT 48.6 50.4 41.8  PLT 199 188 186   BMET Recent Labs    10/09/20 0321 10/10/20 0415 10/11/20 0451  NA 142 143 131*  K 3.1* 3.7 3.0*  CL 95* 97* 86*  CO2 36* 37* 25  GLUCOSE 109* 127* 255*  BUN 18 14 23   CREATININE 0.52* 0.46* 0.61  CALCIUM 8.9 8.6* 8.0*   LFT Recent Labs    10/11/20 0451  PROT 5.4*  ALBUMIN 2.5*  AST 45*  ALT 64*  ALKPHOS 34*  BILITOT 2.5*   PT/INR Recent Labs    10/10/20 2326  LABPROT 13.4  INR 1.1    STUDIES: DG Abd 1 View  Result Date: 10/10/2020 CLINICAL DATA:  ET/OG  placement EXAM: PORTABLE CHEST - 1 VIEW; ABDOMEN - 1 VIEW COMPARISON:  Chest radiograph 10/08/2020, abdominal radiograph 09/21/2020 FINDINGS: *Transesophageal tube terminates in the mid trachea, 6 cm from the carina. *Transesophageal tube tip and side port distal to the GE junction, terminating in the left upper quadrant. *Telemetry leads and external support devices overlie the chest. Stable cardiomegaly accounting for differences in technique. Persistent pulmonary vascular congestion and bibasilar opacities, left greater than right. Some basilar atelectatic changes are present as well. No visible pneumothorax. No acute osseous or soft tissue abnormality. Remote deformity of the distal left clavicle with  heterotopic ossification. Additional degenerative changes are present in the imaged spine and shoulders. Abdominal radiograph only partially encompasses the abdomen. No suspicious calcifications or high-grade obstructive bowel gas pattern. No visible subdiaphragmatic free air. IMPRESSION: Satisfactory positioning of the lines and tubes as above. Persistent heterogeneous opacities bilaterally with additional features of pulmonary edema. No acute abnormality within the visualized portions of the abdomen. Electronically Signed   By: Lovena Le M.D.   On: 10/10/2020 05:14   DG Chest Port 1 View  Result Date: 10/10/2020 CLINICAL DATA:  ET/OG placement EXAM: PORTABLE CHEST - 1 VIEW; ABDOMEN - 1 VIEW COMPARISON:  Chest radiograph 10/08/2020, abdominal radiograph 09/21/2020 FINDINGS: *Transesophageal tube terminates in the mid trachea, 6 cm from the carina. *Transesophageal tube tip and side port distal to the GE junction, terminating in the left upper quadrant. *Telemetry leads and external support devices overlie the chest. Stable cardiomegaly accounting for differences in technique. Persistent pulmonary vascular congestion and bibasilar opacities, left greater than right. Some basilar atelectatic changes are  present as well. No visible pneumothorax. No acute osseous or soft tissue abnormality. Remote deformity of the distal left clavicle with heterotopic ossification. Additional degenerative changes are present in the imaged spine and shoulders. Abdominal radiograph only partially encompasses the abdomen. No suspicious calcifications or high-grade obstructive bowel gas pattern. No visible subdiaphragmatic free air. IMPRESSION: Satisfactory positioning of the lines and tubes as above. Persistent heterogeneous opacities bilaterally with additional features of pulmonary edema. No acute abnormality within the visualized portions of the abdomen. Electronically Signed   By: Lovena Le M.D.   On: 10/10/2020 05:14   [place] EKG SITE RITE  Result Date: 10/10/2020 If Site Rite image not attached, placement could not be confirmed due to current cardiac rhythm.     Impression / Plan:   Eddie Chapman is a 60 y.o. y/o male who was admitted on 09/21/2020 for respiratory failure requiring intubation subsequently extubated and reintubated about 7 days later on 10/10/2020.  I have been consulted for blood noted in the OG tube.  There has been no significant drop in hemoglobin and no change in the BUN/creatinine ratio.  Could be secondary to traumatic intubation versus trauma from the OG-tube.  Plan 1.  IV PPI 2. OG tube was flushed with no bloody aspirate that returned.  This was done at bedside.  Straw-colored fluid was aspirated.  Most likely the episode of bleeding has resolved.  Closely monitor if there is concern for further bleeding then would suggest we perform endoscopy  Thank you for involving me in the care of this patient.      LOS: 20 days   Eddie Bellows, MD  10/11/2020, 9:00 AM

## 2020-10-11 NOTE — Progress Notes (Signed)
PHARMACY CONSULT NOTE - FOLLOW UP  Pharmacy Consult for Electrolyte Monitoring and Replacement   Recent Labs: Potassium (mmol/L)  Date Value  10/11/2020 3.0 (L)  05/04/2014 4.3   Magnesium (mg/dL)  Date Value  10/11/2020 1.7   Calcium (mg/dL)  Date Value  10/11/2020 8.0 (L)   Calcium, Total (mg/dL)  Date Value  05/04/2014 9.1   Albumin (g/dL)  Date Value  10/11/2020 2.5 (L)   Phosphorus (mg/dL)  Date Value  10/11/2020 2.6   Sodium (mmol/L)  Date Value  10/11/2020 131 (L)  05/04/2014 140   Assessment: 60 year old male presented with AMS due to septic shock d/t PNA. PMH includes OSA and CHF. Patient has been on diuresis with electrolyte abnormalities. Pt last received Lasix 60 mg once on 1/9. Pharmacy to manage electrolytes.  Goal of Therapy:  Electrolytes WNL  Plan:  Potassium 40 mEq per tube x 2 doses and magnesium 2 g IV x 1.   Follow up electrolytes with morning labs.  Tawnya Crook, PharmD Clinical Pharmacist  10/11/2020 11:42 AM

## 2020-10-12 DIAGNOSIS — Z515 Encounter for palliative care: Secondary | ICD-10-CM | POA: Diagnosis not present

## 2020-10-12 DIAGNOSIS — Z7189 Other specified counseling: Secondary | ICD-10-CM | POA: Diagnosis not present

## 2020-10-12 DIAGNOSIS — J9601 Acute respiratory failure with hypoxia: Secondary | ICD-10-CM | POA: Diagnosis not present

## 2020-10-12 DIAGNOSIS — I5031 Acute diastolic (congestive) heart failure: Secondary | ICD-10-CM | POA: Diagnosis not present

## 2020-10-12 DIAGNOSIS — G9341 Metabolic encephalopathy: Secondary | ICD-10-CM | POA: Diagnosis not present

## 2020-10-12 DIAGNOSIS — J9600 Acute respiratory failure, unspecified whether with hypoxia or hypercapnia: Secondary | ICD-10-CM | POA: Diagnosis not present

## 2020-10-12 LAB — CBC WITH DIFFERENTIAL/PLATELET
Abs Immature Granulocytes: 0.06 10*3/uL (ref 0.00–0.07)
Basophils Absolute: 0 10*3/uL (ref 0.0–0.1)
Basophils Relative: 1 %
Eosinophils Absolute: 0.1 10*3/uL (ref 0.0–0.5)
Eosinophils Relative: 2 %
HCT: 42.8 % (ref 39.0–52.0)
Hemoglobin: 14 g/dL (ref 13.0–17.0)
Immature Granulocytes: 1 %
Lymphocytes Relative: 20 %
Lymphs Abs: 1.3 10*3/uL (ref 0.7–4.0)
MCH: 30 pg (ref 26.0–34.0)
MCHC: 32.7 g/dL (ref 30.0–36.0)
MCV: 91.6 fL (ref 80.0–100.0)
Monocytes Absolute: 0.9 10*3/uL (ref 0.1–1.0)
Monocytes Relative: 13 %
Neutro Abs: 4.3 10*3/uL (ref 1.7–7.7)
Neutrophils Relative %: 63 %
Platelets: 212 10*3/uL (ref 150–400)
RBC: 4.67 MIL/uL (ref 4.22–5.81)
RDW: 13.2 % (ref 11.5–15.5)
WBC: 6.6 10*3/uL (ref 4.0–10.5)
nRBC: 0 % (ref 0.0–0.2)

## 2020-10-12 LAB — GLUCOSE, CAPILLARY
Glucose-Capillary: 81 mg/dL (ref 70–99)
Glucose-Capillary: 90 mg/dL (ref 70–99)
Glucose-Capillary: 105 mg/dL — ABNORMAL HIGH (ref 70–99)
Glucose-Capillary: 88 mg/dL (ref 70–99)
Glucose-Capillary: 79 mg/dL (ref 70–99)
Glucose-Capillary: 94 mg/dL (ref 70–99)

## 2020-10-12 LAB — PHOSPHORUS: Phosphorus: 3.6 mg/dL (ref 2.5–4.6)

## 2020-10-12 LAB — COMPREHENSIVE METABOLIC PANEL
ALT: 71 U/L — ABNORMAL HIGH (ref 0–44)
AST: 46 U/L — ABNORMAL HIGH (ref 15–41)
Albumin: 2.7 g/dL — ABNORMAL LOW (ref 3.5–5.0)
Alkaline Phosphatase: 38 U/L (ref 38–126)
Anion gap: 12 (ref 5–15)
BUN: 25 mg/dL — ABNORMAL HIGH (ref 8–23)
CO2: 34 mmol/L — ABNORMAL HIGH (ref 22–32)
Calcium: 8.9 mg/dL (ref 8.9–10.3)
Chloride: 94 mmol/L — ABNORMAL LOW (ref 98–111)
Creatinine, Ser: 0.77 mg/dL (ref 0.61–1.24)
GFR, Estimated: >60 mL/min (ref >60)
Glucose, Bld: 90 mg/dL (ref 70–99)
Potassium: 3.7 mmol/L (ref 3.5–5.1)
Sodium: 140 mmol/L (ref 135–145)
Total Bilirubin: 1 mg/dL (ref 0.3–1.2)
Total Protein: 6.1 g/dL — ABNORMAL LOW (ref 6.5–8.1)

## 2020-10-12 LAB — TRIGLYCERIDES: Triglycerides: 218 mg/dL — ABNORMAL HIGH (ref <150)

## 2020-10-12 LAB — MAGNESIUM: Magnesium: 1.9 mg/dL (ref 1.7–2.4)

## 2020-10-12 MED ORDER — POTASSIUM CHLORIDE 20 MEQ PO PACK: 40.0000 meq | PACK | Freq: Once | ORAL | Status: DC

## 2020-10-12 NOTE — Progress Notes (Addendum)
PHARMACY CONSULT NOTE - FOLLOW UP  Pharmacy Consult for Electrolyte Monitoring and Replacement   Recent Labs: Potassium (mmol/L)  Date Value  10/12/2020 3.7  05/04/2014 4.3   Magnesium (mg/dL)  Date Value  10/12/2020 1.9   Calcium (mg/dL)  Date Value  10/12/2020 8.9   Calcium, Total (mg/dL)  Date Value  05/04/2014 9.1   Albumin (g/dL)  Date Value  10/12/2020 2.7 (L)   Phosphorus (mg/dL)  Date Value  10/12/2020 3.6   Sodium (mmol/L)  Date Value  10/12/2020 140  05/04/2014 140   Assessment: 60 year old male presented with AMS due to septic shock d/t PNA. PMH includes OSA and CHF. Patient has been on diuresis with electrolyte abnormalities. Pt last received Lasix 60 mg once on 1/9. Pharmacy to manage electrolytes.  Goal of Therapy:  Electrolytes WNL  Plan:  Potassium 40 mEq for one dose if patient able to tolerate to prevent potassium from dropping. Patient with h/o low electrolytes.   Follow up electrolytes with morning labs.  Tawnya Crook, PharmD Clinical Pharmacist  10/12/2020 2:30 PM

## 2020-10-12 NOTE — Progress Notes (Signed)
CRITICAL CARE NOTE [name] admitted with SEVEREAcute hypoxemic respiratory failuredue toCT chestwithimpressive Left lung pneumoniacomplicated byAcute metabolic encephalopathy related to critical illness,  Acute kidney injury due to ATN, associated withmorbidobesity. Was extubated and transferred to [place].  On 10/11/19 he is unresponsive with snoring respirations and copious secretions requiring intubation for airway protection following Ativan administration.  History of Present Illness:  60 yo M with hx of cerebral aneurysm, PVD, HFrEF, pulmonary htn, morbid obesity, OSA, GERD, hx of TBI, dyslipidemia who came in after girlfriend found him to be unresponsive. He was found to be severely hypoxemic in circulatory shock with left lung infiltrate on CXR. Labwork consistent with sepsis.   Significant Hospital Events:  09/22/20- patient is off vasopressors. He is weaning on FIO2. Family at bedside (girlfriend). Patient is weaned to 40% FIO2. 09/23/20-patient failed SBT today. 09/25/20-patient with continued tenacious secretions and patient also with altered mentation. Started Precedex in hopes to be able to wean Versed. As patient extremely altered and difficult to arouse. CT head and CT chest obtained 09/26/20-Pateint following commands intermittently but agitated.  SBT attempted this morning and failed. Patient was taking TV of 200 on 10/5. RR increased to 40. Placed back on rate and sedaiton increased.  09/27/2020-patient attempted another spontaneous breathing trial. Patient becomes extremely agitated. Patient takes shallow breaths and has high respiratory rate. 12/30-remains heavily sedated 09/29/2020:Poorly responsive when sedation is lightened, does not tolerate SBT 10/01/2019:Not tolerating SBT, will need goals of care addressed 10/02/2019:More responsive, still unable to tolerate SBT well 10/03/19- failed SBT today, palliative care evaluation in process 10/04/19-  patient passed SBT and was extubated to BIPAP. I spoke with patients father and met with GF at bedside for a second update. 1/5 remains extubated, high risk for intubation 1/6 remains on high flow high risk for intubation 1/11: Pt unresponsive with snoring respirations and copious secretions following Benzodiazepines. Transfer to ICU for emergent intubation for airway protection. 1/12 severe resp failure, bloody contents from OGT, PICC line placed 1/12 Go consulted to assess for GIB   Consults:  PCCM  Procedures:  1/11: Endotracheal intubation  Significant Diagnostic Tests:  12/23: CT head>>1. No acute intracranial findings. 2. Chronic right hemispheric encephalomalacia, not appreciably changed from prior CT. 12/27: CT head>>1. No acute intracranial hemorrhage. 2. 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 12/27: CT chest without contrast>>1. Endotracheal tube located at the carina, just proximal to the origin of the left mainstem bronchus. This study demonstrates in expiratory phase of respiration, therefore recommend retraction by no more than 2 cm. 2. Left lower lobe consolidation with associated bilateral trace pleural effusions. Associated 1 cm precarinal lymph node that is likely reactive in etiology. No definite abscess formation identified; however, limited evaluation on this noncontrast study. An underlying pulmonary mass cannot be excluded. Followup PA and lateral chest X-ray is recommended in 3-4 weeks following therapy to ensure resolution and exclude underlying malignancy. 3. A indeterminate 3.5 cm right adrenal gland mass. Recommend further evaluation with dedicated CT adrenal protocol. 4. Suggestion of an ectatic ascending aorta with limited evaluation on this noncontrast study. Aortic aneurysm NOS (ICD10-I71.9). Aortic Atherosclerosis (ICD10-I70.0). 5. Other imaging findings of potential clinical significance: At least  mild four-vessel coronary artery calcifications. Cardiomegaly. Multilevel mid to lower thoracic chronic appearing compression fractures with greater than 80% height loss of the T9 vertebral body. 12/29: Venous ultrasound bilateral lower extremities>>Sonographic survey of the bilateral lower extremities negative for DVT 12/30: CT head without contrast>>Right frontal  and parietal encephalomalacia consistent with old infarction. No acute intracranial abnormality seen. 1/11: Chest x-ray>>Satisfactory positioning of the lines and tubes as above. Persistent heterogeneous opacities bilaterally with additional features of pulmonary edema. No acute abnormality within the visualized portions of the abdomen. 1/11: KUB>>Abdominal radiograph only partially encompasses the abdomen. No suspicious calcifications or high-grade obstructive bowel gas pattern. No visible subdiaphragmatic free air.  Micro Data:  12/23: SARS-CoV-2 PCR>> negative 12/23: Influenza AMB PCR>> negative 12/24: HIV screen>> nonreactive 12/23: Blood cultures x2>> no growth 12/23: Sputum culture>> normal respiratory flora 12/23: MRSA PCR>> negative 12/28: Blood culture>>Methicillin (oxacillin) resistant coagulase negative staphylococcus (questionable contaminant) 12/28: Repeat blood culture>> no growth 1/11: Tracheal aspirate>>  Antimicrobials:  Cefepime 12/23>> 12/23 Zosyn 12/23>> 12/30 Vancomycin 12/23>> 12/23; 1/5>> 1/7 Meropenem 1/5>>     CC  Follow up resp failure  HPI Remains ill On vent +GIB ?EGD today Weaning trials on Hold   BP 102/71   Pulse 83   Temp 98.9 F (37.2 C)   Resp 16   Ht '5\' 10"'  (1.778 m)   Wt 135.6 kg   SpO2 92%   BMI 42.89 kg/m    I/O last 3 completed shifts: In: 2028.7 [I.V.:1709.8; IV Piggyback:318.8] Out: 9244 [Urine:1402; Emesis/NG output:2010] No intake/output data recorded.  SpO2: 92 % O2 Flow Rate (L/min): 6 L/min FiO2 (%): 40 %  Estimated body mass index is 42.89  kg/m as calculated from the following:   Height as of this encounter: '5\' 10"'  (1.778 m).   Weight as of this encounter: 135.6 kg.  REVIEW OF SYSTEMS  PATIENT IS UNABLE TO PROVIDE COMPLETE REVIEW OF SYSTEM S DUE TO SEVERE CRITICAL ILLNESS AND ENCEPHALOPATHY  PHYSICAL EXAMINATION:  GENERAL:critically ill appearing, +resp distress HEAD: Normocephalic, atraumatic.  EYES: Pupils equal, round, reactive to light.  No scleral icterus.  MOUTH: Moist mucosal membrane. NECK: Supple. No thyromegaly. No nodules. No JVD.  PULMONARY: +rhonchi, +wheezing CARDIOVASCULAR: S1 and S2. Regular rate and rhythm. No murmurs, rubs, or gallops.  GASTROINTESTINAL: Soft, nontender, -distended. Positive bowel sounds.  MUSCULOSKELETAL: No swelling, clubbing, or edema.  NEUROLOGIC: obtunded SKIN:intact,warm,dry     BMP Latest Ref Rng & Units 10/12/2020 10/11/2020 10/10/2020  Glucose 70 - 99 mg/dL 90 255(H) 127(H)  BUN 8 - 23 mg/dL 25(H) 23 14  Creatinine 0.61 - 1.24 mg/dL 0.77 0.61 0.46(L)  Sodium 135 - 145 mmol/L 140 131(L) 143  Potassium 3.5 - 5.1 mmol/L 3.7 3.0(L) 3.7  Chloride 98 - 111 mmol/L 94(L) 86(L) 97(L)  CO2 22 - 32 mmol/L 34(H) 25 37(H)  Calcium 8.9 - 10.3 mg/dL 8.9 8.0(L) 8.6(L)      Indwelling Urinary Catheter continued, requirement due to   Reason to continue Indwelling Urinary Catheter strict Intake/Output monitoring for hemodynamic instability   Central Line/ continued, requirement due to  Reason to continue [place] of central venous pressure or other hemodynamic parameters and poor IV access   Ventilator continued, requirement due to severe respiratory failure   Ventilator Sedation RASS 0 to -2      ASSESSMENT AND PLAN SYNOPSIS  60 yo morbidly obese white male with recurrent boust of resp  failure requiring MV support Acute hypoxic and hypercapnic respiratory failure in the setting of left lung pneumonia Intubated for airway protection on 1/11 following  administration of benzodiazepines +aspiration pneumonia  Severe ACUTE Hypoxic and Hypercapnic Respiratory Failure -continue Mechanical Ventilator support -continue Bronchodilator Therapy -Wean Fio2 and PEEP as tolerated -VAP/VENT bundle implementation -will perform SAT/SBT when respiratory parameters are  met  ACUTE DIASTOLIC CARDIAC FAILURE-  -oxygen as needed -Lasix as tolerated -follow up cardiac enzymes as indicated -follow up cardiology recs   Morbid obesity, possible OSA.   Will certainly impact respiratory mechanics, ventilator weaning Suspect will need to consider additional PEEP    NEUROLOGY - intubated and sedated - minimal sedation to achieve a RASS goal: -1 Wake up assessment pending  CARDIAC ICU monitoring  ID -continue IV abx as prescibed -follow up cultures  GI GI PROPHYLAXIS as indicated Seems that no more active bleeding, h/h stable   DIET-->on hold Constipation protocol as indicated  ENDO - will use ICU hypoglycemic\Hyperglycemia protocol if indicated     ELECTROLYTES -follow labs as needed -replace as needed -pharmacy consultation and following     DVT/GI PRX ordered and assessed TRANSFUSIONS AS NEEDED MONITOR FSBS I Assessed the need for Labs I Assessed the need for Foley I Assessed the need for Central Venous Line Family Discussion when available I Assessed the need for Mobilization I made an Assessment of medications to be adjusted accordingly Safety Risk assessment completed  CASE DISCUSSED IN MULTIDISCIPLINARY ROUNDS WITH ICU TEAM     Critical Care Time devoted to patient care services described in this note is 45 minutes.   Overall, patient is critically ill, prognosis is guarded.     Corrin Parker, M.D.  [facility]  Medical Director [facility] Cardio-Pulmonary Department

## 2020-10-12 NOTE — Progress Notes (Signed)
Pt extubated without complications, placed on HFNC at this time at 7lpm [place], sats 95%. Will continue to monitor.

## 2020-10-12 NOTE — Progress Notes (Signed)
Eddie Chapman , MD [address] [place], [place], [place], [address] [address], [place], [place], [place], [address] Phone: [phone]  Fax: [phone]   TAELON BENDORF is being followed for GI bleed Day 1 of follow up   Subjective:  Extubated earlier today.  No bowel movements recently.  No further hematemesis.   Objective: Vital signs in last 24 hours: Vitals:   10/12/20 0600 10/12/20 0730 10/12/20 0800 10/12/20 0830  BP: 93/61 102/71 100/70 106/79  Pulse: 81 83 83 86  Resp: '16 16 16 16  ' Temp:  98.9 F (37.2 C)    TempSrc:      SpO2: 91% 92% 95% 95%  Weight:      Height:       Weight change: 1.4 kg  Intake/Output Summary (Last 24 hours) at 10/12/2020 4132 Last data filed at 10/11/2020 2354 Gross per 24 hour  Intake 1173.92 ml  Output 2065 ml  Net -891.08 ml     Exam:  Abdomen: soft, nontender, normal bowel sounds   Lab Results: '@LABTEST2' @ Micro Results: No results found for this or any previous visit (from the past 240 hour(s)). Studies/Results: No results found. Medications: I have reviewed the patient's current medications. Scheduled Meds: . chlorhexidine gluconate (MEDLINE KIT)  15 mL Mouth Rinse BID  . Chlorhexidine Gluconate Cloth  6 each Topical Daily  . docusate  200 mg Per Tube BID  . enoxaparin (LOVENOX) injection  0.5 mg/kg Subcutaneous Q24H  . fentaNYL (SUBLIMAZE) injection  50 mcg Intravenous Once  . insulin aspart  0-15 Units Subcutaneous Q4H  . ipratropium-albuterol  3 mL Nebulization Q6H  . mouth rinse  15 mL Mouth Rinse 10 times per day  . multivitamin with minerals  1 tablet Per Tube Daily  . pantoprazole (PROTONIX) IV  40 mg Intravenous Q24H  . polyethylene glycol  17 g Per Tube Daily  . sodium chloride flush  10-40 mL Intracatheter Q12H  . sodium chloride flush  10-40 mL Intracatheter Q12H   Continuous Infusions: . sodium chloride 250 mL (10/09/20 0506)  . sodium chloride 10 mL/hr at 10/10/20 0627  . sodium chloride  Stopped (10/10/20 1444)  . fentaNYL infusion INTRAVENOUS 150 mcg/hr (10/12/20 0048)  . norepinephrine (LEVOPHED) Adult infusion 1 mcg/min (10/11/20 2354)  . piperacillin-tazobactam (ZOSYN)  IV 3.375 g (10/12/20 0812)  . propofol (DIPRIVAN) infusion 50 mcg/kg/min (10/12/20 0624)  . sodium chloride     PRN Meds:.acetaminophen, fentaNYL, fentaNYL (SUBLIMAZE) injection, hydrALAZINE, ipratropium-albuterol, labetalol, midazolam, midazolam, polyethylene glycol, sodium chloride, sodium chloride flush, sodium chloride flush, sodium chloride flush   Assessment: Active Problems:   Essential hypertension   Septic shock (HCC)   Acute respiratory failure with hypoxia and hypercapnia (HCC)   Pneumonia involving left lung   OSA (obstructive sleep apnea)   Obesity hypoventilation syndrome (HCC)   Acute metabolic encephalopathy   Acute diastolic CHF (congestive heart failure) (HCC)   AKI (acute kidney injury) ([place])   Morbid obesity with BMI of 40.0-44.9, adult [facility])   JAXX HUISH is a 60 y.o. y/o male who was admitted on 09/21/2020 for respiratory failure requiring intubation subsequently extubated and reintubated about 7 days later on 10/10/2020.  I have been consulted for blood noted in the OG tube.  There has been no significant drop in hemoglobin and no change in the BUN/creatinine ratio.  Could be secondary to traumatic intubation versus trauma from the OG-tube.Hb been stable at 14 grams  Plan 1.  IV PPI 2.  No further hematemesis and no bowel movements or concerns of melena at this point of time  I will sign off.  Please call me if any further GI concerns or questions.  We would like to thank you for the opportunity to participate in the care of [place].   LOS: 21 days   Eddie Bellows, MD 10/12/2020, 9:25 AM

## 2020-10-12 NOTE — Progress Notes (Signed)
PT Cancellation Note  Patient Details Name: Eddie Chapman MRN: [id number] DOB: 07/08/1961   Cancelled Treatment:    Reason Eval/Treat Not Completed: Patient not medically ready PT consult received, chart reviewed. Per chart and RN pt extubated today 10/12/2020. PT to hold evaluation this PM to allow further assessment/stability of respiratory status prior to exertional activity.) Will re-attempt next date pending medically appropriate.  Lieutenant Diego PT, DPT 1:15 PM,10/12/20

## 2020-10-12 NOTE — Progress Notes (Signed)
Palliative:  HPI: 60 yo M with hx of cerebral aneurysm, PVD, HFrEF, pulmonary htn, morbid obesity, OSA, GERD, hx of TBI, dyslipidemia who came in after girlfriend found him to be unresponsive. He was found to be severely hypoxemic in circulatory shock with left lung infiltrate on CXR. Labwork consistent with sepsis.  I followed up with Eddie Chapman today and Eddie Chapman at bedside today. He has been successfully extubated. He is doing well today. He is alert and oriented. He expresses that he is ready to eat and drink and mainly wants some cold water. We discussed being patient after extubation and allowing his voice and swallowing muscles to heal to provide him with safe swallow function. He understands. He is motivated to work with SLP and get back to trying to rehab and recover.   Furthermore he tells me that he is glad he went back on ventilator to get him over this decline. I asked him if he would want the breathing tube again and he tells me "I want to live." He feels like he was having improvement and is hopeful to get to rehab and continue to slowly improve. He expresses desire for full code and full aggressive care at this time. I offered my support and encouragement to be patient and work with therapy services.   Eddie Chapman denies any further questions/concerns. He states that I answered all his questions already and thanked me for my visit and information. Emotional support provided.   Exam: Alert, oriented. No distress. Breathing well off vent. Voice still hoarse - will need SLP f/u. Abd soft. Moves all extremities.   Plan: - Endorses desire for full code.  - Hopeful for improvement.   25 min  Eddie Sill, NP Palliative Medicine Team Pager [phone] (Please see [email or web address] for schedule) Team Phone [phone]    Greater than 50%  of this time was spent counseling and coordinating care related to the above assessment and plan

## 2020-10-12 NOTE — Progress Notes (Signed)
SLP Cancellation Note  Patient Details Name: Eddie Chapman MRN: [id number] DOB: 1961-07-09   Cancelled treatment:       Reason Eval/Treat Not Completed: Patient not medically ready (chart reviewed). Per chart notes, pt was extubated this morning post re-intubation secondary to Pulmonary decline this week; severe resp failure, bloody contents from OGT w/ ongoing assessment for GIB.  Will hold on BSE today as pt was just extubated. ST services will f/u tomorrow. Recommend frequent oral care for hygiene and stimulation of swallowing; aspiration precautions.      Orinda Kenner, MS, CCC-SLP Speech Language Pathologist Rehab Services [phone] [facility] 10/12/2020, 11:31 AM

## 2020-10-13 LAB — CBC WITH DIFFERENTIAL/PLATELET
Abs Immature Granulocytes: 0.06 10*3/uL (ref 0.00–0.07)
Basophils Absolute: 0 10*3/uL (ref 0.0–0.1)
Basophils Relative: 1 %
Eosinophils Absolute: 0.1 10*3/uL (ref 0.0–0.5)
Eosinophils Relative: 1 %
HCT: 45 % (ref 39.0–52.0)
Hemoglobin: 14.6 g/dL (ref 13.0–17.0)
Immature Granulocytes: 1 %
Lymphocytes Relative: 12 %
Lymphs Abs: 1 10*3/uL (ref 0.7–4.0)
MCH: 29.9 pg (ref 26.0–34.0)
MCHC: 32.4 g/dL (ref 30.0–36.0)
MCV: 92 fL (ref 80.0–100.0)
Monocytes Absolute: 1.1 10*3/uL — ABNORMAL HIGH (ref 0.1–1.0)
Monocytes Relative: 13 %
Neutro Abs: 5.8 10*3/uL (ref 1.7–7.7)
Neutrophils Relative %: 72 %
Platelets: 238 10*3/uL (ref 150–400)
RBC: 4.89 MIL/uL (ref 4.22–5.81)
RDW: 13 % (ref 11.5–15.5)
WBC: 8.1 10*3/uL (ref 4.0–10.5)
nRBC: 0 % (ref 0.0–0.2)

## 2020-10-13 LAB — GLUCOSE, CAPILLARY
Glucose-Capillary: 88 mg/dL (ref 70–99)
Glucose-Capillary: 80 mg/dL (ref 70–99)
Glucose-Capillary: 92 mg/dL (ref 70–99)
Glucose-Capillary: 110 mg/dL — ABNORMAL HIGH (ref 70–99)
Glucose-Capillary: 108 mg/dL — ABNORMAL HIGH (ref 70–99)

## 2020-10-13 LAB — PHOSPHORUS: Phosphorus: 3.1 mg/dL (ref 2.5–4.6)

## 2020-10-13 LAB — MAGNESIUM: Magnesium: 1.8 mg/dL (ref 1.7–2.4)

## 2020-10-13 LAB — TRIGLYCERIDES: Triglycerides: 197 mg/dL — ABNORMAL HIGH (ref <150)

## 2020-10-13 MED ORDER — METOPROLOL TARTRATE 5 MG/5ML IV SOLN
2.5000 mg | INTRAVENOUS | Status: DC | PRN
  Administered 2020-10-13: 2.5 mg via INTRAVENOUS
  Filled 2020-10-13: qty 5

## 2020-10-13 MED ORDER — SACUBITRIL-VALSARTAN 24-26 MG PO TABS
1.0000 | ORAL_TABLET | Freq: Two times a day (BID) | ORAL | Status: DC
  Administered 2020-10-14 – 2020-10-20 (×13): 1 via ORAL
  Filled 2020-10-13 (×15): qty 1

## 2020-10-13 MED ORDER — ADULT MULTIVITAMIN W/MINERALS CH
1.0000 | ORAL_TABLET | Freq: Every day | ORAL | Status: DC
  Administered 2020-10-14 – 2020-10-20 (×7): 1 via ORAL
  Filled 2020-10-13 (×7): qty 1

## 2020-10-13 MED ORDER — METOPROLOL SUCCINATE ER 100 MG PO TB24
100.0000 mg | ORAL_TABLET | Freq: Every day | ORAL | Status: DC
Start: 2020-10-13 — End: 2020-10-20
  Administered 2020-10-13 – 2020-10-20 (×8): 100 mg via ORAL
  Filled 2020-10-13 (×4): qty 1
  Filled 2020-10-13: qty 2
  Filled 2020-10-13 (×3): qty 1

## 2020-10-13 MED ORDER — POLYETHYLENE GLYCOL 3350 17 G PO PACK: 17.0000 g | PACK | Freq: Every day | ORAL | Status: DC | PRN

## 2020-10-13 MED ORDER — DOCUSATE SODIUM 50 MG/5ML PO LIQD
200.0000 mg | Freq: Two times a day (BID) | ORAL | Status: DC
  Administered 2020-10-14: 200 mg via ORAL
  Filled 2020-10-13 (×5): qty 20

## 2020-10-13 MED ORDER — ENSURE ENLIVE PO LIQD
237.0000 mL | Freq: Two times a day (BID) | ORAL | Status: DC
  Administered 2020-10-13 – 2020-10-20 (×15): 237 mL via ORAL

## 2020-10-13 MED ORDER — ACETAMINOPHEN 325 MG PO TABS: 650.0000 mg | ORAL_TABLET | Freq: Four times a day (QID) | ORAL | Status: DC | PRN

## 2020-10-13 MED ORDER — MAGNESIUM SULFATE 2 GM/50ML IV SOLN
2.0000 g | Freq: Once | INTRAVENOUS | Status: AC
  Administered 2020-10-13: 2 g via INTRAVENOUS
  Filled 2020-10-13: qty 50

## 2020-10-13 MED ORDER — POLYETHYLENE GLYCOL 3350 17 G PO PACK
17.0000 g | PACK | Freq: Every day | ORAL | Status: DC
  Filled 2020-10-13: qty 1

## 2020-10-13 MED ORDER — IPRATROPIUM-ALBUTEROL 0.5-2.5 (3) MG/3ML IN SOLN
3.0000 mL | Freq: Three times a day (TID) | RESPIRATORY_TRACT | Status: DC
  Administered 2020-10-13 – 2020-10-20 (×20): 3 mL via RESPIRATORY_TRACT
  Filled 2020-10-13 (×19): qty 3

## 2020-10-13 NOTE — Progress Notes (Signed)
PHARMACY CONSULT NOTE - FOLLOW UP  Pharmacy Consult for Electrolyte Monitoring and Replacement   Recent Labs: Potassium (mmol/L)  Date Value  10/12/2020 3.7  05/04/2014 4.3   Magnesium (mg/dL)  Date Value  10/13/2020 1.8   Calcium (mg/dL)  Date Value  10/12/2020 8.9   Calcium, Total (mg/dL)  Date Value  05/04/2014 9.1   Albumin (g/dL)  Date Value  10/12/2020 2.7 (L)   Phosphorus (mg/dL)  Date Value  10/13/2020 3.1   Sodium (mmol/L)  Date Value  10/12/2020 140  05/04/2014 140   Assessment: 60 year old male presented with AMS due to septic shock d/t PNA. PMH includes OSA and CHF. Patient has been on diuresis with electrolyte abnormalities. Pt last received Lasix 60 mg once on 1/9. Pharmacy to manage electrolytes.  Goal of Therapy:  Electrolytes WNL  Plan:  No replacement today. Entresto restarted 1/14.  Follow up electrolytes with morning labs.  Tawnya Crook, PharmD Clinical Pharmacist  10/13/2020 2:02 PM

## 2020-10-13 NOTE — Progress Notes (Addendum)
Nutrition Follow-up  DOCUMENTATION CODES:   Morbid obesity  INTERVENTION:  Provide Ensure Enlive po BID, each supplement provides 350 kcal and 20 grams of protein.  NUTRITION DIAGNOSIS:   Increased nutrient needs related to catabolic illness (heart failure) as evidenced by estimated needs.  Ongoing.  GOAL:   Patient will meet greater than or equal to 90% of their needs  Progressing with advancement of diet.  MONITOR:   PO intake,Supplement acceptance,Diet advancement,Labs,Weight trends,I & O's  REASON FOR ASSESSMENT:   Ventilator,Consult Enteral/tube feeding initiation and management  ASSESSMENT:   60 year old male with PMHx of cerebral aneurysm, TBI, HLD, HTN, depression, GERD, HFrEF, OSA admitted with severe hypoxemia, circulatory shock, left lung consolidated PNA.  12/23 intubated 1/4 extubated and diet advanced to dysphagia 1 with nectar-thick liquids 1/7 diet advanced to dysphagia 1 with thin liquids 1/8 transferred out of ICU to PCU 1/11 transferred back to ICU and reintubated 1/13 extubated 1/14 diet advanced to dysphagia 1 with thin liquids  Met with patient at bedside prior to SLP evaluation. He reports he was ready to eat and drink. Discussed he would have bedside swallow evaluation today. Diet has now been advanced. Patient denied any nausea this AM. He is amenable to drinking oral nutrition supplements again to help meet calorie/protein needs. Patient was previously eating 95-100% of his meals before being reintubated.  Medications reviewed and include: Colace 200 mg BID, Novolog 0-15 units Q4hrs, MVI daily, Protonix, Miralax, Zosyn.  Labs reviewed: CBG 88-94.  I/O: 3050 mL UOP yesterday (0.9 mL/kg/hr)  Weight trend: 135.6 kg on 1/13; -9.5 kg from 12/23; pt has been diuresed this admission  Discussed on rounds.  Diet Order:   Diet Order            DIET - DYS 1 Room service appropriate? Yes with Assist; Fluid consistency: Thin  Diet effective now                 EDUCATION NEEDS:   No education needs have been identified at this time  Skin:  Skin Assessment: Reviewed RN Assessment  Last BM:  10/14/2019 per chart  Height:   Ht Readings from Last 1 Encounters:  10/10/20 '5\' 10"'  (1.778 m)   Weight:   Wt Readings from Last 1 Encounters:  10/12/20 135.6 kg   Ideal Body Weight:  75.5 kg  BMI:  Body mass index is 42.89 kg/m.  Estimated Nutritional Needs:   Kcal:  2700-3000  Protein:  >135 grams  Fluid:  2-2.3 L/day  Jacklynn Barnacle, MS, RD, LDN Pager number available on Amion

## 2020-10-13 NOTE — Evaluation (Signed)
Clinical/Bedside Swallow Evaluation Patient Details  Name: Eddie Chapman MRN: [id number] Date of Birth: 1961/07/08  Today's Date: 10/13/2020 Time: SLP Start Time (ACUTE ONLY): 71 SLP Stop Time (ACUTE ONLY): 1205 SLP Time Calculation (min) (ACUTE ONLY): 60 min  Past Medical History:  Past Medical History:  Diagnosis Date  . Adenomatous colon polyp   . Depression   . Ear drum perforation    left x2   . GERD (gastroesophageal reflux disease)   . Hyperlipidemia   . Hypertension   . T8 vertebral fracture [facility])    Past Surgical History:  Past Surgical History:  Procedure Laterality Date  . CARPAL TUNNEL RELEASE     left hand  . COLONOSCOPY  09/18/2009, 09/15/2014  . COLONOSCOPY WITH PROPOFOL N/A 12/29/2017   Procedure: COLONOSCOPY WITH PROPOFOL;  Surgeon: Manya Silvas, MD;  Location: [facility] ENDOSCOPY;  Service: Endoscopy;  Laterality: N/A;  . EYE SURGERY    . FRACTURE SURGERY    . HERNIA REPAIR    . LIPOMA EXCISION    . SPINE SURGERY     HPI:  Pt is a 60 yo M with hx of cerebral aneurysm, PVD, HFrEF, pulmonary htn, morbid obesity, OSA, GERD, hx of TBI, dyslipidemia who came in after girlfriend found him to be unresponsive.  He was found to be severely hypoxemic in circulatory shock with left lung infiltrate on CXR. Labwork consistent with sepsis. Due to decline in status, pt was orally intubated at admit; extubated on 10/03/2020. Currently on HFNC at ~50% per RT. Pt awake, verbal but w/ slower mentation and decision making -- this is Baseline s/p accident when he fell off a roof in 2016 per GF and Father.  During this admit, on 10/10/2020, pt became unresponsive with snoring respirations and copious secretions following Benzodiazepines. He was transfer to ICU for emergent intubation for airway protection. He was extubated on 10/12/2020.   Assessment / Plan / Recommendation Clinical Impression  Pt seen for reassessment of BSE today s/p decline in medical status and brief  re-intubation/extubation. He appears to present w/ risk for pharyngeal phase dysphagia moreson w/ thin liquids in light of his overall lengthy illness and min declined Cognitive awareness at Baseline. Pt was recently extubated on 10/12/2020 s/p 3 days of oral intubation; initially intubated at admit(10/03/2020) for ~13 days b/f extubation. At this evaluation, pt was given modified liquid consistency w/ purees, ice chips(no solid foods or thin liquids assessed). No overt neuromuscular deficits noted w/ these trial consistencies and No immediate, overt, clinical s/s of aspiration during po trials. However, pt has a Baseline of Significantly declined Pulmonary status which impacts his Stamina and respiratory endurance during ANY exertion/task including even talking, po's. Pt's RR increased into the upper20s to 30s intermittently w/ po trials and talking. W/ aspiration precautions and Rest Breaks to address conservation of energy and lessen WOB/SOB, risk for aspiration appears reduced somewhat. Any such Pulmonary decline/presentation can impact safety w/ oral intake d/t the mistiming of the Apnea moment during swallowing. During po trials, pt consumed consistencies w/ no immediate, overt coughing, decline in vocal quality. Respiratory rate and effort increased briefly during/post the task but w/ Rest Break, pt was able to calm the RR to his Baseline of the low20s again. Oral phase appeared [facility] w/ timely bolus management and control of bolus propulsion for A-P transfer for swallowing. Oral clearing achieved w/ all trial consistencies. OM Exam appeared [facility] w/ no unilateral weakness noted. Speech Clear. Pt required assistance w/ po's.  Recommend initiation of a Dysphagia 1 diet w/ Nectar liquids for conservation of energy and reduced risk for aspiration - by Cup/straw sips. Recommend aspiration precautions, Pills Whole vs Crushed in Puree for safer, easier swallowing. Much Education w/ pt/family member on interaction of  swallowing and breathing; impact of declined Pulmonary status on swallowing and overall oral intake of some foods in general d/t their consistency/demand. Discussed conservation of energy thorugh Pills in Puree; food consistencies and easy to eat options; must follow aspiration precautions to include frequent Rest Breaks to calm breathing rate during any oral intake. MD and NSG updated. Pt agreed. Dietician present to provide support of supplements. ST services will f/u w/ toleration of diet and trials to upgrade as appropriate; education as needed. SLP Visit Diagnosis: Dysphagia, oropharyngeal phase (R13.12)    Aspiration Risk  Mild aspiration risk;Risk for inadequate nutrition/hydration (reduced following precautions)    Diet Recommendation  Dysphagia level 1 w/ Nectar liquids; general aspiration precautions; Reflux precautions. Support w/ feeding at all meals. Reduce distractions at meals.  Medication Administration: Whole meds with puree (vs crushed as needed)    Other  Recommendations Recommended Consults:  (Dietician f/u) Oral Care Recommendations: Oral care BID;Oral care before and after PO;Staff/trained caregiver to provide oral care Other Recommendations: Order thickener from pharmacy;Prohibited food (jello, ice cream, thin soups);Remove water pitcher;Have oral suction available   Follow up Recommendations None (TBD)      Frequency and Duration min 3x week  2 weeks       Prognosis Prognosis for Safe Diet Advancement: Fair (-Good) Barriers to Reach Goals: Cognitive deficits;Time post onset;Severity of deficits;Behavior;Motivation Barriers/Prognosis Comment: Pulmonary decline      Swallow Study   General Date of Onset: 09/21/20 HPI: Pt is a 60 yo M with hx of cerebral aneurysm, PVD, HFrEF, pulmonary htn, morbid obesity, OSA, GERD, hx of TBI, dyslipidemia who came in after girlfriend found him to be unresponsive.  He was found to be severely hypoxemic in circulatory shock with  left lung infiltrate on CXR. Labwork consistent with sepsis. Due to decline in status, pt was orally intubated at admit; extubated on 10/03/2020. Currently on HFNC at ~50% per RT. Pt awake, verbal but w/ slower mentation and decision making -- this is Baseline s/p accident when he fell off a roof in 2016 per GF and Father.  During this admit, on 10/10/2020, pt became unresponsive with snoring respirations and copious secretions following Benzodiazepines. He was transfer to ICU for emergent intubation for airway protection. He was extubated on 10/12/2020. Type of Study: Bedside Swallow Evaluation (repeat d/t re-intubation/extubation) Previous Swallow Assessment: 10/04/2020 Diet Prior to this Study: NPO (pt had upgraded to a level 3 w/ thins prior) Temperature Spikes Noted: No (wbc 8.1) Respiratory Status: Nasal cannula (4L) History of Recent Intubation: Yes Length of Intubations (days): 13 days (then 3 more day of re-intubation) Date extubated: 10/03/20 (then again on 10/12/2020) Behavior/Cognition: Alert;Cooperative;Pleasant mood;Distractible;Requires cueing Oral Cavity Assessment: Within Functional Limits Oral Care Completed by SLP: Yes Oral Cavity - Dentition: Adequate natural dentition Vision: Functional for self-feeding Self-Feeding Abilities: Able to feed self;Needs assist;Needs set up;Total assist (UE weakness) Patient Positioning: Upright in bed (needed positioning support for Upright) Baseline Vocal Quality: Normal Volitional Cough: Strong Volitional Swallow: Able to elicit    Oral/Motor/Sensory Function Overall Oral Motor/Sensory Function: Within functional limits   Ice Chips Ice chips: Within functional limits Presentation: Spoon (fed; 5 trials)   Thin Liquid Thin Liquid: Not tested    Nectar Thick Nectar Thick  Liquid: Within functional limits Presentation: Spoon;Cup;Straw;Self Fed (supported; ~4 ozs)   Honey Thick Honey Thick Liquid: Not tested   Puree Puree: Within functional  limits Presentation: Spoon (fed; 10 trials)   Solid     Solid: Not tested        Orinda Kenner, MS, [place] Speech Language Pathologist Rehab Services [phone] [facility] 10/13/2020,3:10 PM

## 2020-10-13 NOTE — Progress Notes (Signed)
OT Cancellation Note  Patient Details Name: Eddie Chapman MRN: [id number] DOB: 1961-05-09   Cancelled Treatment:    Reason Eval/Treat Not Completed: Medical issues which prohibited therapy. Order Received & Chart reviewed. OT attempted to see as co-tx with PT, however pt BP per room monitor outside of safe parameter's recommended for exertional activity (168/113; 169/112). Per physical therapist increased to 168/119 with minimal bed-level activity. Will hold OT consult at this time and re-attempt at a later date/time as available and pt medically appropriate for OT evaluation.   Shara Blazing, M.S., OTR/L Ascom: [phone] 10/13/20, 1:04 PM   Eddie Chapman 10/13/2020, 1:01 PM

## 2020-10-13 NOTE — Progress Notes (Signed)
CRITICAL CARE NOTE [name] admitted with SEVEREAcute hypoxemic respiratory failuredue toCT chestwithimpressive Left lung pneumoniacomplicated byAcute metabolic encephalopathy related to critical illness,  Acute kidney injury due to ATN, associated withmorbidobesity. Was extubated and transferred to [place].  On 10/11/19 he is unresponsive with snoring respirations and copious secretions requiring intubation for airway protection following Ativan administration.  History of Present Illness:  60 yo M with hx of cerebral aneurysm, PVD, HFrEF, pulmonary htn, morbid obesity, OSA, GERD, hx of TBI, dyslipidemia who came in after girlfriend found him to be unresponsive. He was found to be severely hypoxemic in circulatory shock with left lung infiltrate on CXR. Labwork consistent with sepsis.   Significant Hospital Events:  09/22/20- patient is off vasopressors. He is weaning on FIO2. Family at bedside (girlfriend). Patient is weaned to 40% FIO2. 09/23/20-patient failed SBT today. 09/25/20-patient with continued tenacious secretions and patient also with altered mentation. Started Precedex in hopes to be able to wean Versed. As patient extremely altered and difficult to arouse. CT head and CT chest obtained 09/26/20-Pateint following commands intermittently but agitated.  SBT attempted this morning and failed. Patient was taking TV of 200 on 10/5. RR increased to 40. Placed back on rate and sedaiton increased.  09/27/2020-patient attempted another spontaneous breathing trial. Patient becomes extremely agitated. Patient takes shallow breaths and has high respiratory rate. 12/30-remains heavily sedated 09/29/2020:Poorly responsive when sedation is lightened, does not tolerate SBT 10/01/2019:Not tolerating SBT, will need goals of care addressed 10/02/2019:More responsive, still unable to tolerate SBT well 10/03/19- failed SBT today, palliative care evaluation in process 10/04/19-  patient passed SBT and was extubated to BIPAP. I spoke with patients father and met with GF at bedside for a second update. 1/5 remains extubated, high risk for intubation 1/6 remains on high flow high risk for intubation 1/11: Pt unresponsive with snoring respirations and copious secretions following Benzodiazepines. Transfer to ICU for emergent intubation for airway protection. 1/12 severe resp failure, bloody contents from OGT, PICC line placed 1/13 GI consulted to assess for GIB 10/13/20- patient with ST today >120 reporting anxiety.  NO grossly appreciable rectal bleeding with normal well formed BMs.  Patient will be optimized for transfer to medical floor.   Consults:  PCCM  Procedures:  1/11: Endotracheal intubation  Significant Diagnostic Tests:  12/23: CT head>>1. No acute intracranial findings. 2. Chronic right hemispheric encephalomalacia, not appreciably changed from prior CT. 12/27: CT head>>1. No acute intracranial hemorrhage. 2. 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 12/27: CT chest without contrast>>1. Endotracheal tube located at the carina, just proximal to the origin of the left mainstem bronchus. This study demonstrates in expiratory phase of respiration, therefore recommend retraction by no more than 2 cm. 2. Left lower lobe consolidation with associated bilateral trace pleural effusions. Associated 1 cm precarinal lymph node that is likely reactive in etiology. No definite abscess formation identified; however, limited evaluation on this noncontrast study. An underlying pulmonary mass cannot be excluded. Followup PA and lateral chest X-ray is recommended in 3-4 weeks following therapy to ensure resolution and exclude underlying malignancy. 3. A indeterminate 3.5 cm right adrenal gland mass. Recommend further evaluation with dedicated CT adrenal protocol. 4. Suggestion of an ectatic ascending aorta with limited  evaluation on this noncontrast study. Aortic aneurysm NOS (ICD10-I71.9). Aortic Atherosclerosis (ICD10-I70.0). 5. Other imaging findings of potential clinical significance: At least mild four-vessel coronary artery calcifications. Cardiomegaly. Multilevel mid to lower thoracic chronic appearing compression fractures with greater than  80% height loss of the T9 vertebral body. 12/29: Venous ultrasound bilateral lower extremities>>Sonographic survey of the bilateral lower extremities negative for DVT 12/30: CT head without contrast>>Right frontal and parietal encephalomalacia consistent with old infarction. No acute intracranial abnormality seen. 1/11: Chest x-ray>>Satisfactory positioning of the lines and tubes as above. Persistent heterogeneous opacities bilaterally with additional features of pulmonary edema. No acute abnormality within the visualized portions of the abdomen. 1/11: KUB>>Abdominal radiograph only partially encompasses the abdomen. No suspicious calcifications or high-grade obstructive bowel gas pattern. No visible subdiaphragmatic free air.  Micro Data:  12/23: SARS-CoV-2 PCR>> negative 12/23: Influenza AMB PCR>> negative 12/24: HIV screen>> nonreactive 12/23: Blood cultures x2>> no growth 12/23: Sputum culture>> normal respiratory flora 12/23: MRSA PCR>> negative 12/28: Blood culture>>Methicillin (oxacillin) resistant coagulase negative staphylococcus (questionable contaminant) 12/28: Repeat blood culture>> no growth 1/11: Tracheal aspirate>>  Antimicrobials:  Cefepime 12/23>> 12/23 Zosyn 12/23>> 12/30 Vancomycin 12/23>> 12/23; 1/5>> 1/7 Meropenem 1/5>>     CC  Follow up resp failure  HPI Remains ill On vent +GIB ?EGD today Weaning trials on Hold   BP (!) 144/90   Pulse (!) 118   Temp 98.7 F (37.1 C)   Resp (!) 26   Ht 5' 10" (1.778 m)   Wt 135.6 kg   SpO2 96%   BMI 42.89 kg/m    I/O last 3 completed shifts: In: 418.8 [I.V.:400.1; IV  Piggyback:18.7] Out: 3615 [Urine:3365; Emesis/NG output:250] No intake/output data recorded.  SpO2: 96 % O2 Flow Rate (L/min): 4 L/min FiO2 (%): (P) 40 %  Estimated body mass index is 42.89 kg/m as calculated from the following:   Height as of this encounter: 5' 10" (1.778 m).   Weight as of this encounter: 135.6 kg.  REVIEW OF SYSTEMS  PATIENT IS UNABLE TO PROVIDE COMPLETE REVIEW OF SYSTEM S DUE TO SEVERE CRITICAL ILLNESS AND ENCEPHALOPATHY  PHYSICAL EXAMINATION:  GENERAL:critically ill appearing, +resp distress HEAD: Normocephalic, atraumatic.  EYES: Pupils equal, round, reactive to light.  No scleral icterus.  MOUTH: Moist mucosal membrane. NECK: Supple. No thyromegaly. No nodules. No JVD.  PULMONARY: +rhonchi, +wheezing CARDIOVASCULAR: S1 and S2. Regular rate and rhythm. No murmurs, rubs, or gallops.  GASTROINTESTINAL: Soft, nontender, -distended. Positive bowel sounds.  MUSCULOSKELETAL: No swelling, clubbing, or edema.  NEUROLOGIC: obtunded SKIN:intact,warm,dry     BMP Latest Ref Rng & Units 10/12/2020 10/11/2020 10/10/2020  Glucose 70 - 99 mg/dL 90 255(H) 127(H)  BUN 8 - 23 mg/dL 25(H) 23 14  Creatinine 0.61 - 1.24 mg/dL 0.77 0.61 0.46(L)  Sodium 135 - 145 mmol/L 140 131(L) 143  Potassium 3.5 - 5.1 mmol/L 3.7 3.0(L) 3.7  Chloride 98 - 111 mmol/L 94(L) 86(L) 97(L)  CO2 22 - 32 mmol/L 34(H) 25 37(H)  Calcium 8.9 - 10.3 mg/dL 8.9 8.0(L) 8.6(L)      Indwelling Urinary Catheter continued, requirement due to   Reason to continue Indwelling Urinary Catheter strict Intake/Output monitoring for hemodynamic instability   Central Line/ continued, requirement due to  Reason to continue Centra Line Monitoring of central venous pressure or other hemodynamic parameters and poor IV access   Ventilator continued, requirement due to severe respiratory failure   Ventilator Sedation RASS 0 to -2      ASSESSMENT AND PLAN SYNOPSIS  60 yo morbidly obese white male with  recurrent boust of resp  failure requiring MV support Acute hypoxic and hypercapnic respiratory failure in the setting of left lung pneumonia Intubated for airway protection on 1/11 following administration of benzodiazepines +  aspiration pneumonia  Severe ACUTE Hypoxic and Hypercapnic Respiratory Failure -patient extubated 10/13/20 -for speech and swallow evaluation    ACUTE DIASTOLIC CARDIAC FAILURE-  -oxygen as needed -Lasix as tolerated -follow up cardiac enzymes as indicated -follow up cardiology recs   Morbid obesity, possible OSA.   Will certainly impact respiratory mechanics, ventilator weaning Suspect will need to consider additional PEEP    NEUROLOGY - intubated and sedated - minimal sedation to achieve a RASS goal: -1 Wake up assessment pending  CARDIAC ICU monitoring  ID -continue IV abx as prescibed -follow up cultures  GI GI PROPHYLAXIS as indicated Seems that no more active bleeding, h/h stable   DIET-->on hold Constipation protocol as indicated  ENDO - will use ICU hypoglycemic\Hyperglycemia protocol if indicated     ELECTROLYTES -follow labs as needed -replace as needed -pharmacy consultation and following     DVT/GI PRX ordered and assessed TRANSFUSIONS AS NEEDED MONITOR FSBS I Assessed the need for Labs I Assessed the need for Foley I Assessed the need for Central Venous Line Family Discussion when available I Assessed the need for Mobilization I made an Assessment of medications to be adjusted accordingly Safety Risk assessment completed  CASE DISCUSSED IN MULTIDISCIPLINARY ROUNDS WITH ICU TEAM     Critical Care Time devoted to patient care services described in this note is 33 minutes.   Overall, patient is critically ill, prognosis is guarded.       , M.D.  Pulmonary & Critical Care Medicine  [facility]        

## 2020-10-13 NOTE — Progress Notes (Signed)
Patient alert on 4 liters of oxygen. No complaints of pain or shortness of breath. Patient diet ordered per SLP and tolerating well. Foley removed. Patient being tx to floor.

## 2020-10-13 NOTE — Progress Notes (Addendum)
PT Cancellation Note  Patient Details Name: Eddie Chapman MRN: [id number] DOB: 1961-04-25   Cancelled Treatment:    Reason Eval/Treat Not Completed: Medical issues which prohibited therapy. Pt alert, agreeable to PT and OT re-evaluations, very motivated, however BP remained elevated. Assessed  x3 during PT time in room, last reading 168/119 after pt was instructed in ankle pumps, quad sets, and glute squeezes. True evaluation held at this time, PT to re-attempt as able.  Lieutenant Diego PT, DPT 1:42 PM,10/13/20

## 2020-10-13 NOTE — Progress Notes (Signed)
PT Cancellation Note  Patient Details Name: NICOLAAS SAVO MRN: [id number] DOB: 06-22-1961   Cancelled Treatment:    Reason Eval/Treat Not Completed: Other (comment). Pt with elevated BP this AM, diastolic greater than 370 when assessed three times while PT standing in room. Official re-eval held until PM to attempt to maximize exertional activity with more appropriate BP.    Lieutenant Diego PT, DPT 11:22 AM,10/13/20

## 2020-10-14 DIAGNOSIS — I5031 Acute diastolic (congestive) heart failure: Secondary | ICD-10-CM | POA: Diagnosis not present

## 2020-10-14 LAB — CBC WITH DIFFERENTIAL/PLATELET
Abs Immature Granulocytes: 0.08 10*3/uL — ABNORMAL HIGH (ref 0.00–0.07)
Basophils Absolute: 0 10*3/uL (ref 0.0–0.1)
Basophils Relative: 0 %
Eosinophils Absolute: 0.1 10*3/uL (ref 0.0–0.5)
Eosinophils Relative: 1 %
HCT: 44.5 % (ref 39.0–52.0)
Hemoglobin: 14.3 g/dL (ref 13.0–17.0)
Immature Granulocytes: 1 %
Lymphocytes Relative: 13 %
Lymphs Abs: 1.2 10*3/uL (ref 0.7–4.0)
MCH: 30 pg (ref 26.0–34.0)
MCHC: 32.1 g/dL (ref 30.0–36.0)
MCV: 93.5 fL (ref 80.0–100.0)
Monocytes Absolute: 1.3 10*3/uL — ABNORMAL HIGH (ref 0.1–1.0)
Monocytes Relative: 15 %
Neutro Abs: 6.2 10*3/uL (ref 1.7–7.7)
Neutrophils Relative %: 70 %
Platelets: 262 10*3/uL (ref 150–400)
RBC: 4.76 MIL/uL (ref 4.22–5.81)
RDW: 13.1 % (ref 11.5–15.5)
WBC: 8.9 10*3/uL (ref 4.0–10.5)
nRBC: 0 % (ref 0.0–0.2)

## 2020-10-14 LAB — GLUCOSE, CAPILLARY
Glucose-Capillary: 111 mg/dL — ABNORMAL HIGH (ref 70–99)
Glucose-Capillary: 117 mg/dL — ABNORMAL HIGH (ref 70–99)
Glucose-Capillary: 121 mg/dL — ABNORMAL HIGH (ref 70–99)
Glucose-Capillary: 122 mg/dL — ABNORMAL HIGH (ref 70–99)
Glucose-Capillary: 123 mg/dL — ABNORMAL HIGH (ref 70–99)
Glucose-Capillary: 129 mg/dL — ABNORMAL HIGH (ref 70–99)
Glucose-Capillary: 94 mg/dL (ref 70–99)

## 2020-10-14 LAB — BASIC METABOLIC PANEL
Anion gap: 10 (ref 5–15)
BUN: 16 mg/dL (ref 8–23)
CO2: 31 mmol/L (ref 22–32)
Calcium: 9.2 mg/dL (ref 8.9–10.3)
Chloride: 99 mmol/L (ref 98–111)
Creatinine, Ser: 0.67 mg/dL (ref 0.61–1.24)
GFR, Estimated: >60 mL/min (ref >60)
Glucose, Bld: 106 mg/dL — ABNORMAL HIGH (ref 70–99)
Potassium: 3.5 mmol/L (ref 3.5–5.1)
Sodium: 140 mmol/L (ref 135–145)

## 2020-10-14 LAB — PHOSPHORUS: Phosphorus: 2.7 mg/dL (ref 2.5–4.6)

## 2020-10-14 LAB — MAGNESIUM: Magnesium: 1.9 mg/dL (ref 1.7–2.4)

## 2020-10-14 MED ORDER — POTASSIUM CHLORIDE CRYS ER 20 MEQ PO TBCR
40.0000 meq | EXTENDED_RELEASE_TABLET | Freq: Once | ORAL | Status: AC
  Administered 2020-10-14: 40 meq via ORAL
  Filled 2020-10-14: qty 2

## 2020-10-14 MED ORDER — POTASSIUM CHLORIDE 20 MEQ PO PACK
40.0000 meq | PACK | Freq: Once | ORAL | Status: AC
  Administered 2020-10-14: 40 meq
  Filled 2020-10-14: qty 2

## 2020-10-14 NOTE — Evaluation (Signed)
Occupational Therapy Re-Evaluation Patient Details Name: Eddie Chapman MRN: [id number] DOB: [birth year] Today's Date: 10/14/2020    History of Present Illness 60yo male presented to ER after found unresponsive, severely hypoxemic; admitted for management of acute hypoxic respiratory failure, septic shock due to consolidated L lung PNA.  Intubated 12/23-1/4; weaned to [place] at this time.   Clinical Impression   Eddie Chapman was seen for OT re-evaluation this date 2/2 change in status. Pt presents to acute OT demonstrating impaired ADL performance and functional mobility 2/2 poor insight into deficits, functional strength/ROM/balance deficits, and decreased activity tolerance. Pt and family given extensive education on d/c recommendations and HEP. Pt reports completing bed level LB exercises hourly.  Upon arrival pt reporting BM in bed, agreeable to standing trial for perihygiene. Pt reports he has not stood in ~1 week 2/2 recent intubation. Pt exits R side of bed c MIN A and requires MAX A for LBD, SBA for tooth brushing seated EOB. MOD A sit<>stand using R rail - preferred BUE on counter for ~2 mins standing time to complete perihygiene with MAX A. 2nd and 3rd standing trials decreasing to ~30 seconds. Pt would benefit from skilled OT to address noted impairments and functional limitations (see below for any additional details) in order to maximize safety and independence while minimizing falls risk and caregiver burden. Upon hospital discharge, recommend CIR to maximize pt safety and return to PLOF.    Follow Up Recommendations  CIR    Equipment Recommendations  3 in 1 bedside commode    Recommendations for Other Services Rehab consult     Precautions / Restrictions Precautions Precautions: Fall Restrictions Weight Bearing Restrictions: No      Mobility Bed Mobility Overal bed mobility: Needs Assistance Bed Mobility: Supine to Sit;Sit to Supine;Rolling Rolling: Min assist   Supine  to sit: Min assist Sit to supine: Mod assist        Transfers Overall transfer level: Needs assistance   Transfers: Sit to/from Stand Sit to Stand: Mod assist         General transfer comment: MOD A using R rail - preferred BUE on counter for ~2 mins standing time decreasing to 30 seconds for 2nd and 3rd trials. (no RW in room)    Balance Overall balance assessment: Needs assistance Sitting-balance support: Single extremity supported;Feet supported Sitting balance-Leahy Scale: Good     Standing balance support: Bilateral upper extremity supported Standing balance-Leahy Scale: Poor                             ADL either performed or assessed with clinical judgement   ADL Overall ADL's : Needs assistance/impaired                                       General ADL Comments: MAX A for LBD at bed level. MAX A perihygiene in standing. SBA for tooth brushing seated EOB                  Pertinent Vitals/Pain Pain Assessment: No/denies pain     Hand Dominance     Extremity/Trunk Assessment Upper Extremity Assessment Upper Extremity Assessment: Generalized weakness   Lower Extremity Assessment Lower Extremity Assessment: Generalized weakness       Communication     Cognition Arousal/Alertness: Awake/alert Behavior During Therapy: Flat affect Overall Cognitive Status: History  of cognitive impairments - at baseline                                 General Comments: Pt extremely cooperative however very talkative and needs to be redirected to stay focused on desired task throughout. +2 for safety if attempting steps/transfer   General Comments  HR 115 during mobility, SpO2 94% on 3L [place]    Exercises Exercises: Other exercises;General Lower Extremity General Exercises - Lower Extremity Long Arc Quad: AROM;Strengthening;Both;10 reps;Seated Hip Flexion/Marching: AROM;Strengthening;Both;10 reps;Seated Other Exercises Other  Exercises: Pt and family educated re: OT role, DME recs, d/c recs, falls prevention, CIR, HEP, importance of mobility for funcitonal strengthening Other Exercises: Tooth brushing, LBD, toileting, sup<>sit, sit<>stand, bed mobility, sitting/standing balance/tolerance     OT Goals(Current goals can be found in the care plan section) Acute Rehab OT Goals Patient Stated Goal: return to PLOF OT Goal Formulation: With patient/family Time For Goal Achievement: 10/28/20 Potential to Achieve Goals: Good ADL Goals Pt Will Perform Lower Body Dressing: with min assist;sit to/from stand Pt Will Transfer to Toilet: with min assist;bedside commode;ambulating;with +2 assist (c LRAD PRN) Pt Will Perform Toileting - Clothing Manipulation and hygiene: sit to/from stand;with set-up;with min guard assist;with 2+ total assist  OT Frequency: Min 3X/week    AM-PAC OT "6 Clicks" Daily Activity     Outcome Measure Help from another person eating meals?: A Little Help from another person taking care of personal grooming?: A Little Help from another person toileting, which includes using toliet, bedpan, or urinal?: A Lot Help from another person bathing (including washing, rinsing, drying)?: A Lot Help from another person to put on and taking off regular upper body clothing?: A Little Help from another person to put on and taking off regular lower body clothing?: A Lot 6 Click Score: 15   End of Session Equipment Utilized During Treatment: Oxygen Nurse Communication: Mobility status  Activity Tolerance: Patient tolerated treatment well Patient left: in bed;with call bell/phone within reach;with bed alarm set;with family/visitor present  OT Visit Diagnosis: Other abnormalities of gait and mobility (R26.89);Muscle weakness (generalized) (M62.81);Other symptoms and signs involving cognitive function                Time: 1009-1053 OT Time Calculation (min): 44 min Charges:  OT General Charges $OT Visit: 1  Visit OT Evaluation $OT Re-eval: 1 Re-eval OT Treatments $Self Care/Home Management : 23-37 mins $Therapeutic Exercise: 8-22 mins  Dessie Coma, M.S. OTR/L  10/14/20, 11:24 AM  ascom [phone]

## 2020-10-14 NOTE — Progress Notes (Signed)
PROGRESS NOTE  LUCILE DIDONATO VOH:[id number] DOB: 1961-07-18 DOA: 09/21/2020 PCP: Adin Hector, MD  HPI/Recap of past 24 hours: [name] admitted withSEVEREAcute hypoxemic respiratory failuredue toCT chestwithimpressive Left lung pneumoniacomplicated byAcute metabolic encephalopathy related to critical illness, Acute kidney injury due to ATN, associated withmorbidobesity. Was extubated and transferred to [place]. On 10/11/19 he is unresponsivewith snoringrespirations and copioussecretionsrequiring intubation for airway protectionfollowing Ativan administration.  60 yo M with hx of cerebral aneurysm, PVD, HFrEF, pulmonary htn, morbid obesity, OSA, GERD, hx of TBI, dyslipidemia who came in after girlfriend found him to be unresponsive. He was found to be severely hypoxemic in circulatory shock with left lung infiltrate on CXR. Labwork consistent with sepsis.  10/14/20: Seen and examined at his bedside.  Extubated on 10/13/20. Currently on 3L with O2 saturation in the mid 90's.  Assessment/Plan: Active Problems:   Essential hypertension   Septic shock (HCC)   Acute respiratory failure with hypoxia and hypercapnia (HCC)   Pneumonia involving left lung   OSA (obstructive sleep apnea)   Obesity hypoventilation syndrome (HCC)   Acute metabolic encephalopathy   Acute diastolic CHF (congestive heart failure) (HCC)   AKI (acute kidney injury) ([place])   Morbid obesity with BMI of 40.0-44.9, adult (HCC)  Severe ACUTE Hypoxic and Hypercapnic Respiratory Failure -patient extubated 10/13/20 -for speech and swallow evaluation  He is on dysphagia 1 diet. Currently on 3 L with O2 sat In the mid 90s Wean off oxygen supplementation as tolerated.  ACUTE DIASTOLIC CARDIAC FAILURE-  -oxygen as needed -Lasix as tolerated Continue to follow up cardiology recs  Elevated troponin, likely demand ischemia in the setting of severe acute hypoxic and hypercarbic respiratory failure   Troponin peaked at 56 and trended down  2D echo 09/22/20 preserved LVEF 50-55% Seen by cardiology  Possible aspiration pneumonia, POA Patient is currently on Zosyn Was seen by speech therapist Currently on dysphagia 1 diet Continue aspiration precautions  Possible OSA Has BIPAP ordered qhs Will need to f/u with pulmonary outpatient for possible sleep study  Morbid obesity.  BMI 41 Recommend weight loss outpatient with healthy dieting and regular physical activity.   Code Status: Full code  Family Communication: None at bedside  Disposition Plan: Pending PT   Consultants:  PCCM  Procedures:  Extubation 10/13/2020  Antimicrobials:  Zosyn  DVT prophylaxis: Subcu Lovenox daily  Status is: Inpatient    Dispo: The patient is from: Home therapy               Anticipated d/c is to: Unknown at baseline               Anticipated d/c date is: 10/17/2020              Patient currently not stable for the results of the ongoing monitor mental acute hypoxic hypercarbic respiratory failure, suspected aspiration pneumonia.         Objective: Vitals:   10/14/20 0422 10/14/20 0758 10/14/20 0843 10/14/20 1126  BP: (!) 155/92  (!) 138/97 114/85  Pulse: 88  (!) 104 93  Resp: 20  18 (!) 24  Temp: 98.2 F (36.8 C)  99.3 F (37.4 C) 98.9 F (37.2 C)  TempSrc: Oral  Oral Oral  SpO2: 94% 90% 97% 95%  Weight:      Height:        Intake/Output Summary (Last 24 hours) at 10/14/2020 1318 Last data filed at 10/14/2020 1116 Gross per 24 hour  Intake 720 ml  Output 715 ml  Net 5 ml   Filed Weights   10/11/20 0453 10/12/20 0447 10/14/20 0235  Weight: 134.2 kg 135.6 kg 130.5 kg    Exam:  . General: 60 y.o. year-old male well developed well nourished in no acute distress.  Alert and oriented x3. . Cardiovascular: Regular rate and rhythm with no rubs or gallops.  No thyromegaly or JVD noted.   Marland Kitchen Respiratory: Diffuse rales bilaterally.  No wheezing noted.  Full  inspiratory effort.   . Abdomen: Soft nontender nondistended with normal bowel sounds x4 quadrants. . Musculoskeletal: No lower extremity edema. 2/4 pulses in all 4 extremities. . Skin: No ulcerative lesions noted or rashes . Psychiatry: Mood is appropriate for condition and setting   Data Reviewed: CBC: Recent Labs  Lab 10/10/20 0415 10/11/20 0451 10/12/20 0446 10/13/20 0322 10/14/20 0454  WBC 8.5 8.0 6.6 8.1 8.9  NEUTROABS 6.5 5.3 4.3 5.8 6.2  HGB 16.3 13.8 14.0 14.6 14.3  HCT 50.4 41.8 42.8 45.0 44.5  MCV 94.6 93.7 91.6 92.0 93.5  PLT 188 186 212 238 655   Basic Metabolic Panel: Recent Labs  Lab 10/09/20 0321 10/10/20 0415 10/11/20 0451 10/12/20 0446 10/13/20 0322 10/14/20 0454  NA 142 143 131* 140  --  140  K 3.1* 3.7 3.0* 3.7  --  3.5  CL 95* 97* 86* 94*  --  99  CO2 36* 37* 25 34*  --  31  GLUCOSE 109* 127* 255* 90  --  106*  BUN _0 25*  --  16  CREATININE 0.52* 0.46* 0.61 0.77  --  0.67  CALCIUM 8.9 8.6* 8.0* 8.9  --  9.2  MG 1.7 1.8 1.7 1.9 1.8 1.9  PHOS 2.0* 3.0 2.6 3.6 3.1 2.7   GFR: Estimated Creatinine Clearance: 131.7 mL/min (by C-G formula based on SCr of 0.67 mg/dL). Liver Function Tests: Recent Labs  Lab 10/08/20 0418 10/09/20 0321 10/10/20 0415 10/11/20 0451 10/12/20 0446  AST 35 46* 55* 45* 46*  ALT 56* 59* 73* 64* 71*  ALKPHOS 39 34* 44 34* 38  BILITOT 0.8 0.9 0.8 2.5* 1.0  PROT 6.8 6.5 6.9 5.4* 6.1*  ALBUMIN 2.9* 2.9* 3.0* 2.5* 2.7*   No results for input(s): LIPASE, AMYLASE in the last 168 hours. No results for input(s): AMMONIA in the last 168 hours. Coagulation Profile: Recent Labs  Lab 10/10/20 2326  INR 1.1   Cardiac Enzymes: No results for input(s): CKTOTAL, CKMB, CKMBINDEX, TROPONINI in the last 168 hours. BNP (last 3 results) No results for input(s): PROBNP in the last 8760 hours. HbA1C: No results for input(s): HGBA1C in the last 72 hours. CBG: Recent Labs  Lab 10/13/20 2030 10/14/20 0002 10/14/20 0419  10/14/20 0828 10/14/20 1123  GLUCAP 108* 117* 94 122* 121*   Lipid Profile: Recent Labs    10/12/20 0446 10/13/20 0322  TRIG 218* 197*   Thyroid Function Tests: No results for input(s): TSH, T4TOTAL, FREET4, T3FREE, THYROIDAB in the last 72 hours. Anemia Panel: No results for input(s): VITAMINB12, FOLATE, FERRITIN, TIBC, IRON, RETICCTPCT in the last 72 hours. Urine analysis:    Component Value Date/Time   COLORURINE YELLOW (A) 09/21/2020 1327   APPEARANCEUR HAZY (A) 09/21/2020 1327   LABSPEC 1.022 09/21/2020 1327   PHURINE 5.0 09/21/2020 1327   GLUCOSEU NEGATIVE 09/21/2020 1327   HGBUR MODERATE (A) 09/21/2020 1327   BILIRUBINUR NEGATIVE 09/21/2020 1327   KETONESUR NEGATIVE 09/21/2020 1327   PROTEINUR >=300 (A) 09/21/2020 1327  NITRITE NEGATIVE 09/21/2020 [place] 09/21/2020 1327   Sepsis Labs: _0 (procalcitonin:4,lacticidven:4)  )No results found for this or any previous visit (from the past 240 hour(s)).    Studies: No results found.  Scheduled Meds: . chlorhexidine gluconate (MEDLINE KIT)  15 mL Mouth Rinse BID  . Chlorhexidine Gluconate Cloth  6 each Topical Daily  . docusate  200 mg Oral BID  . enoxaparin (LOVENOX) injection  0.5 mg/kg Subcutaneous Q24H  . feeding supplement  237 mL Oral BID BM  . fentaNYL (SUBLIMAZE) injection  50 mcg Intravenous Once  . insulin aspart  0-15 Units Subcutaneous Q4H  . ipratropium-albuterol  3 mL Nebulization TID  . metoprolol succinate  100 mg Oral Daily  . multivitamin with minerals  1 tablet Oral Daily  . pantoprazole (PROTONIX) IV  40 mg Intravenous Q24H  . polyethylene glycol  17 g Oral Daily  . sacubitril-valsartan  1 tablet Oral BID  . sodium chloride flush  10-40 mL Intracatheter Q12H    Continuous Infusions: . sodium chloride 250 mL (10/09/20 0506)  . sodium chloride 10 mL/hr at 10/10/20 0627  . sodium chloride Stopped (10/10/20 1444)  . piperacillin-tazobactam (ZOSYN)  IV 3.375 g  (10/14/20 0502)  . sodium chloride       LOS: 23 days     Kayleen Memos, MD [facility] Pager [phone]  If 7PM-7AM, please contact night-coverage [email or web address] Password [email or web address] 10/14/2020, 1:18 PM

## 2020-10-14 NOTE — Evaluation (Addendum)
Physical Therapy Evaluation Patient Details Name: Eddie Chapman MRN: [id number] DOB: May 18, 1961 Today's Date: 10/14/2020   History of Present Illness  [name] admitted with SEVERE Acute hypoxemic respiratory failure due to CT chest with impressive Left lung pneumonia complicated by Acute metabolic encephalopathy related to critical illness,  Acute kidney injury due to ATN, associated with morbid obesity. Was extubated and transferred to [facility].  On 10/11/19 he is unresponsive with snoring respirations and copious secretions requiring intubation for airway protection following Ativan administration. 60 yo M with hx of cerebral aneurysm, PVD, HFrEF, pulmonary htn, morbid obesity, OSA, GERD, hx of TBI, dyslipidemia who came in after girlfriend found him to be unresponsive.  He was found to be severely hypoxemic in circulatory shock with left lung infiltrate on CXR. Labwork consistent with sepsis. Extubated on 10/13/20. Currently on 3L with O2 saturation in the mid 90's.    Clinical Impression  Pt seen for re-evaluation. Prior to admission pt was independent with mobility (per girlfriend today, pt did not use AD). Vitals checked at beginning & end of session, see below, and nurse cleared pt for participation in tx. Pt very motivated & eager to get OOB & walk to hallway but unsafe without +2 assist at this time. Pt requires min assist for bed mobility with strong reliance on bed rails & HOB elevated. Pt performs BLE LAQ for strengthening & sit<>stand with mod assist with cuing for hand placement when using RW. PT provides max cuing & assist for RW management with side steps to L at EOB. Pt is able to lift RLE but decreased ability to clear LLE off of floor & requiring extra time to complete movement. Pt noticeably fatigued and returns to sitting EOB. [facility] reports they were contacted by CIR and PT educates pt on intensive therapies at CIR as pt is very motivated to participate and could benefit from  intensive therapies to maximize independence with functional mobility & reduce fall risk prior to return home. Will continue to follow pt acutely to progress gait as able & address standing balance, BLE strengthening & endurance.  Pt's current goals are appropriate at this time & date has been updated accordingly.  Vitals at beginning of session: BP = 167/77 mmHg (LUE forearm), HR =104-113 bpm, SpO2 = 97% on 3L/min via nasal cannula  HR increased to 120 bpm with standing  Vitals at end of session:  BP = 147/90 mmHg (LUE forearm), HR = 104 bpm, SPO2 = 96% on 3L/min via nasal cannula     Follow Up Recommendations CIR    Equipment Recommendations  None recommended by PT (TBD in next venue)    Recommendations for Other Services       Precautions / Restrictions Precautions Precautions: Fall Restrictions Weight Bearing Restrictions: No      Mobility  Bed Mobility Overal bed mobility: Needs Assistance Bed Mobility: Supine to Sit;Sit to Supine     Supine to sit: Mod assist;HOB elevated Sit to supine: Min assist;HOB elevated   General bed mobility comments: Extra time, use of bed rails, assistance to fully upright trunk for supine>sit    Transfers Overall transfer level: Needs assistance Equipment used: Rolling walker (2 wheeled) Transfers: Sit to/from Stand Sit to Stand: Mod assist         General transfer comment: cuing for proper hand placement, elevated EOB  Ambulation/Gait Ambulation/Gait assistance: Mod assist Gait Distance (Feet): 2 Feet Assistive device: Rolling walker (2 wheeled) Gait Pattern/deviations: Decreased step length - right;Decreased step  length - left Gait velocity: decreased   General Gait Details: PT provides max cuing & assistance for RW management for side steps to L at EOB. Pt is able to clear RLE from floor but decreased ability to lift LLE.  Stairs            Wheelchair Mobility    Modified Rankin (Stroke Patients Only)        Balance Overall balance assessment: Needs assistance Sitting-balance support: Feet supported;Bilateral upper extremity supported Sitting balance-Leahy Scale: Fair Sitting balance - Comments: supervision sitting EOB   Standing balance support: Bilateral upper extremity supported Standing balance-Leahy Scale: Poor Standing balance comment: BUE reliance on RW                             Pertinent Vitals/Pain Pain Assessment: No/denies pain    Home Living Family/patient expects to be discharged to:: Private residence Living Arrangements: Spouse/significant other Available Help at Discharge: Family Type of Home: House       Home Layout: One level Home Equipment: Cane - single point      Prior Function Level of Independence:  (Girlfriend in room reports pt was independent without AD PTA)               Hand Dominance   Dominant Hand: Right    Extremity/Trunk Assessment   Upper Extremity Assessment Upper Extremity Assessment: Generalized weakness    Lower Extremity Assessment Lower Extremity Assessment: Generalized weakness    Cervical / Trunk Assessment Cervical / Trunk Assessment: Kyphotic (posterior pelvic tilt)  Communication   Communication:  (tangential speech)  Cognition Arousal/Alertness: Awake/alert Behavior During Therapy: Impulsive Overall Cognitive Status: History of cognitive impairments - at baseline                                 General Comments: Tangential speech, VERY HOH (R ear best), Impaired emergent & anticipatory awareness (pt requesting to walk to window or hall, not recognizing decreased safety with only 1 person assist)      General Comments      Exercises General Exercises - Lower Extremity Long Arc Quad: AROM;Strengthening;Both;10 reps;Seated   Assessment/Plan    PT Assessment Patient needs continued PT services  PT Problem List Decreased strength;Decreased range of motion;Decreased activity  tolerance;Decreased balance;Decreased mobility;Decreased cognition;Decreased knowledge of use of DME;Decreased safety awareness;Decreased knowledge of precautions;Cardiopulmonary status limiting activity;Obesity       PT Treatment Interventions DME instruction;Gait training;Functional mobility training;Stair training;Therapeutic activities;Therapeutic exercise;Balance training;Patient/family education;Cognitive remediation    PT Goals (Current goals can be found in the Care Plan section)  Acute Rehab PT Goals Patient Stated Goal: return to PLOF PT Goal Formulation: With patient/family Time For Goal Achievement: 10/28/20 Potential to Achieve Goals: Good    Frequency Min 2X/week   Barriers to discharge Decreased caregiver support      Co-evaluation               AM-PAC PT "6 Clicks" Mobility  Outcome Measure Help needed turning from your back to your side while in a flat bed without using bedrails?: A Lot Help needed moving from lying on your back to sitting on the side of a flat bed without using bedrails?: A Lot Help needed moving to and from a bed to a chair (including a wheelchair)?: Total Help needed standing up from a chair using your arms (e.g., wheelchair or bedside  chair)?: A Lot Help needed to walk in hospital room?: Total Help needed climbing 3-5 steps with a railing? : Total 6 Click Score: 9    End of Session Equipment Utilized During Treatment: Gait belt;Oxygen Activity Tolerance: Patient tolerated treatment well Patient left: in bed;with call bell/phone within reach;with bed alarm set;with family/visitor present Nurse Communication: Mobility status (vitals) PT Visit Diagnosis: Muscle weakness (generalized) (M62.81);Difficulty in walking, not elsewhere classified (R26.2)    Time: 3295-1884 PT Time Calculation (min) (ACUTE ONLY): 25 min   Charges:   PT Evaluation $PT Re-evaluation: 1 Re-eval PT Treatments $Therapeutic Activity: 23-37 mins         Lavone Nian, PT, DPT 10/14/20, 4:33 PM   Waunita Schooner 10/14/2020, 4:24 PM

## 2020-10-14 NOTE — Progress Notes (Signed)
Speech Language Pathology Treatment: Dysphagia  Patient Details Name: Eddie Chapman MRN: [id number] DOB: [birth year] Today's Date: 10/14/2020 Time: 3500-9381 SLP Time Calculation (min) (ACUTE ONLY): 45 min  Assessment / Plan / Recommendation Clinical Impression  Pt seen for ongoing assessment of swallowing. He appears to continue to improve daily; alert todayand verbally responsive w/ SLP and family member present(who was leaving to go home). He follow instructions w/ min cues; easily distracted w/ decreased insight in general. Pt has Baseline Cognitive impairment s/p Fall and Head Injury per chart notes.Pt is on [place] O2 support 3L. O2 sats 95-97%. Pt explained general aspiration precautions and agreed verbally to the need for following them especially sitting upright for all oral intake; Small sips Slowly via Cup. Pt assisted w/ positioning d/t weakness then given trials of thin liquids, Mech Soft w/ purees to moistenw/NO overt clinical s/s of aspiration were noted w/ any trial;respiratory status remainedgenerallycalm and unlabored, vocal quality clear b/t trials. O2 sats remained ~95%. Rest Breaks given intermittently to allow for RR calming d/t min increased w/ exertion of task; pt has tended to drink impulsively taking multiple sips. SLP educated on Pacing.Pt held Cup when drinking following instructions for single, small sips slowly w/ cues w/out decline in status during/post trials. Oral phase appeared grossly [facility] for bolus management, mastication, and timely A-P transfer for swallowing of increased textured trials of mech soft foods -- moistening the foods appeared helpful to reduce exertion of mastication/effort; oral clearing achieved.  Recommend upgrade to Dysphagia level 3 diet (mech soft) w/ gravies added to moisten foods; Thin liquids via Cup. Recommend general aspiration precautions; Pills Whole in Puree; tray setup and positioning assistance for meals. ST services will continue to f/u  w/ pt for toleration of diet, trials to upgrade food consistency in diet,and education on precautionswhile admitted. Education given to pt food consistencies and prep for ease of mastication - use of gravies. Aspiration precautions and monitoring of RR during meals - Rest Breaks as needed. NSG updated. Precautions posted at bedside.    HPI HPI: Pt is a 60 yo M with hx of cerebral aneurysm, PVD, HFrEF, pulmonary htn, morbid obesity, OSA, GERD, hx of TBI, dyslipidemia who came in after girlfriend found him to be unresponsive.  He was found to be severely hypoxemic in circulatory shock with left lung infiltrate on CXR. Labwork consistent with sepsis. Due to decline in status, pt was orally intubated at admit; extubated on 10/03/2020. Currently on HFNC at ~50% per RT. Pt awake, verbal but w/ slower mentation and decision making -- this is Baseline s/p accident when he fell off a roof in 2016 per GF and Father.  During this admit, on 10/10/2020, pt became unresponsive with snoring respirations and copious secretions following Benzodiazepines. He was transfer to ICU for emergent intubation for airway protection. He was extubated on 10/12/2020.      SLP Plan  Continue with current plan of care       Recommendations  Diet recommendations: Dysphagia 3 (mechanical soft);Thin liquid Liquids provided via: Cup;No straw Medication Administration: Whole meds with puree Supervision: Patient able to self feed;Staff to assist with self feeding;Intermittent supervision to cue for compensatory strategies Compensations: Minimize environmental distractions;Slow rate;Small sips/bites;Lingual sweep for clearance of pocketing;Follow solids with liquid Postural Changes and/or Swallow Maneuvers: Seated upright 90 degrees;Upright 30-60 min after meal                General recommendations:  (Dietician f/u) Oral Care Recommendations: Oral care BID;Oral care  before and after PO;Staff/trained caregiver to provide oral  care Follow up Recommendations: None (TBD) SLP Visit Diagnosis: Dysphagia, unspecified (R13.10) Plan: Continue with current plan of care       GO                 Orinda Kenner, [place], [place] [facility] [phone] [facility] 10/14/2020, 4:31 PM

## 2020-10-14 NOTE — Progress Notes (Signed)
PHARMACY CONSULT NOTE - FOLLOW UP  Pharmacy Consult for Electrolyte Monitoring and Replacement   Recent Labs: Potassium (mmol/L)  Date Value  10/14/2020 3.5  05/04/2014 4.3   Magnesium (mg/dL)  Date Value  10/14/2020 1.9   Calcium (mg/dL)  Date Value  10/14/2020 9.2   Calcium, Total (mg/dL)  Date Value  05/04/2014 9.1   Albumin (g/dL)  Date Value  10/12/2020 2.7 (L)   Phosphorus (mg/dL)  Date Value  10/14/2020 2.7   Sodium (mmol/L)  Date Value  10/14/2020 140  05/04/2014 140   Assessment: 60 year old male presented with AMS due to septic shock d/t PNA. PMH includes OSA and CHF. Patient has been on diuresis with electrolyte abnormalities. Pt last received Lasix 60 mg once on 1/9. Pharmacy to manage electrolytes.  Goal of Therapy:  Electrolytes WNL  Plan:  K 3.5  Mag 1.9  Phos 2.7  Scr 0.67 NP has ordered KCL 40 meq po x 1.  Entresto restarted 1/14.  Follow up electrolytes with morning labs.  Noralee Space, PharmD Clinical Pharmacist  10/14/2020 1:50 PM

## 2020-10-15 DIAGNOSIS — I5031 Acute diastolic (congestive) heart failure: Secondary | ICD-10-CM | POA: Diagnosis not present

## 2020-10-15 LAB — CBC WITH DIFFERENTIAL/PLATELET
Abs Immature Granulocytes: 0.09 10*3/uL — ABNORMAL HIGH (ref 0.00–0.07)
Basophils Absolute: 0.1 10*3/uL (ref 0.0–0.1)
Basophils Relative: 1 %
Eosinophils Absolute: 0.1 10*3/uL (ref 0.0–0.5)
Eosinophils Relative: 1 %
HCT: 47.5 % (ref 39.0–52.0)
Hemoglobin: 15.4 g/dL (ref 13.0–17.0)
Immature Granulocytes: 1 %
Lymphocytes Relative: 12 %
Lymphs Abs: 1.2 10*3/uL (ref 0.7–4.0)
MCH: 30 pg (ref 26.0–34.0)
MCHC: 32.4 g/dL (ref 30.0–36.0)
MCV: 92.4 fL (ref 80.0–100.0)
Monocytes Absolute: 1.4 10*3/uL — ABNORMAL HIGH (ref 0.1–1.0)
Monocytes Relative: 14 %
Neutro Abs: 7.7 10*3/uL (ref 1.7–7.7)
Neutrophils Relative %: 71 %
Platelets: 275 10*3/uL (ref 150–400)
RBC: 5.14 MIL/uL (ref 4.22–5.81)
RDW: 13 % (ref 11.5–15.5)
WBC: 10.6 10*3/uL — ABNORMAL HIGH (ref 4.0–10.5)
nRBC: 0 % (ref 0.0–0.2)

## 2020-10-15 LAB — GLUCOSE, CAPILLARY
Glucose-Capillary: 93 mg/dL (ref 70–99)
Glucose-Capillary: 88 mg/dL (ref 70–99)
Glucose-Capillary: 89 mg/dL (ref 70–99)
Glucose-Capillary: 116 mg/dL — ABNORMAL HIGH (ref 70–99)
Glucose-Capillary: 115 mg/dL — ABNORMAL HIGH (ref 70–99)

## 2020-10-15 LAB — BLOOD GAS, VENOUS
Acid-Base Excess: 8.8 mmol/L — ABNORMAL HIGH (ref 0.0–2.0)
Bicarbonate: 35.9 mmol/L — ABNORMAL HIGH (ref 20.0–28.0)
O2 Saturation: 78.6 %
Patient temperature: 37
pCO2, Ven: 58 mmHg (ref 44.0–60.0)
pH, Ven: 7.4 (ref 7.250–7.430)
pO2, Ven: 43 mmHg (ref 32.0–45.0)

## 2020-10-15 LAB — PHOSPHORUS: Phosphorus: 2.5 mg/dL (ref 2.5–4.6)

## 2020-10-15 LAB — POTASSIUM: Potassium: 3.7 mmol/L (ref 3.5–5.1)

## 2020-10-15 LAB — MAGNESIUM: Magnesium: 1.6 mg/dL — ABNORMAL LOW (ref 1.7–2.4)

## 2020-10-15 MED ORDER — POLYETHYLENE GLYCOL 3350 17 G PO PACK: 17.0000 g | PACK | Freq: Every day | ORAL | Status: DC | PRN

## 2020-10-15 MED ORDER — MAGNESIUM SULFATE 2 GM/50ML IV SOLN
2.0000 g | Freq: Once | INTRAVENOUS | Status: AC
  Administered 2020-10-15: 2 g via INTRAVENOUS
  Filled 2020-10-15: qty 50

## 2020-10-15 MED ORDER — ENOXAPARIN SODIUM 80 MG/0.8ML ~~LOC~~ SOLN
0.5000 mg/kg | SUBCUTANEOUS | Status: DC
  Administered 2020-10-15 – 2020-10-19 (×5): 65 mg via SUBCUTANEOUS
  Filled 2020-10-15 (×6): qty 0.8

## 2020-10-15 MED ORDER — DOCUSATE SODIUM 50 MG/5ML PO LIQD
200.0000 mg | Freq: Every day | ORAL | Status: DC | PRN
  Filled 2020-10-15: qty 20

## 2020-10-15 NOTE — Progress Notes (Addendum)
PROGRESS NOTE  Eddie Chapman VFI:[id number] DOB: 1961/03/02 DOA: 09/21/2020 PCP: Adin Hector, MD  HPI/Recap of past 24 hours: [name] admitted withSEVEREAcute hypoxemic respiratory failurerequiring intubation and invasive mechanical ventilation due to left lower lobe community-acquired pneumonia complicated byAcute metabolic encephalopathy related to critical illness, Acute kidney injury due to ATN. Was extubated and transferred to [place] on 10/10/20 he was unresponsivewith snoringrespirations and copioussecretionsrequiring intubation for airway protectionfollowing Ativan administration.  60 yo M with hx of cerebral aneurysm, PVD, HFrEF, pulmonary htn, morbid obesity, OSA, GERD, hx of TBI, dyslipidemia who came in after girlfriend found him to be unresponsive. He was found to be severely hypoxemic in circulatory shock with left lung infiltrate on CXR. Workup was consistent with sepsis. Intubated on 10/10/20, extubated on 10/13/2020.  10/15/20: Seen and examined at bedside.  He is alert and oriented x3. Unclear if he wore his CPAP last night.  Assessment/Plan: Active Problems:   Essential hypertension   Septic shock (HCC)   Acute respiratory failure with hypoxia and hypercapnia (HCC)   Pneumonia involving left lung   OSA (obstructive sleep apnea)   Obesity hypoventilation syndrome (HCC)   Acute metabolic encephalopathy   Acute diastolic CHF (congestive heart failure) (HCC)   AKI (acute kidney injury) ([place])   Morbid obesity with BMI of 40.0-44.9, adult (HCC)  Severe ACUTE Hypoxic and Hypercapnic Respiratory Failure secondary to left lower lobe community-acquired pneumonia, requiring intubation with invasive mechanical ventilation. Patient was initially found unresponsive at home by his girlfriend. Admission chest x-ray revealed significant infiltrate in left lower lobe, personally reviewed.  He was treated and completed his course of IV antibiotics. Patient was  transferred to [place], however 10/10/2020 he was found unresponsive was transferred to the ICU and intubated. Patient extubated 10/13/20 He was seen by speech therapist recommendation initially for dysphagia 1 diet, advanced to dysphagia 3 diet. He is down to 3 L nasal cannula with O2 saturation in the mid 90s. Continue to wean off oxygen supplementation as tolerated.  Hypercarbia with concern for OHS Continue BiPAP at night Obtained ABG on 10/15/2020, follow results  ACUTE DIASTOLIC CARDIAC FAILURE-  Continue current cardiac medications, he is on Entresto Continue to follow up cardiology recs Last 2D echo done on 09/22/2020 showed normal LVEF 50 to 55% Continue strict I's and O's and daily weight Net I&O -5.9 L  Elevated troponin, likely demand ischemia in the setting of severe acute hypoxic and hypercarbic respiratory failure  Troponin peaked at 56 and trended down  2D echo 09/22/20 preserved LVEF 50-55% Seen by cardiology Denies any anginal symptoms at the time of this visit.  Possible aspiration pneumonia, POA Patient is currently on Zosyn, complete course. Was seen by speech therapist His diet has been advanced from dysphagia 1 to dysphagia 3 diet. Continue aspiration precautions  Possible OSA Has BIPAP ordered qhs Will need to f/u with pulmonary outpatient for possible sleep study  Morbid obesity.  BMI 41 Recommend weight loss outpatient with healthy dieting and regular physical activity.  Physical debility PT OT to assess Fall precautions.   Code Status: Full code  Family Communication: None at bedside  Disposition Plan: Pending PT   Consultants:  PCCM  Procedures:  Extubation 10/13/2020  Antimicrobials:  Zosyn  DVT prophylaxis: Subcu Lovenox daily  Status is: Inpatient    Dispo: The patient is from: Home therapy               Anticipated d/c is to: Unknown at baseline  Anticipated d/c date is: 10/17/2020              Patient  currently not stable for the results of the ongoing monitor mental acute hypoxic hypercarbic respiratory failure, suspected aspiration pneumonia.         Objective: Vitals:   10/14/20 2330 10/15/20 0412 10/15/20 0718 10/15/20 0746  BP:  (!) 126/99  (!) 127/91  Pulse:  92  97  Resp:  18  20  Temp:  98.2 F (36.8 C)  98 F (36.7 C)  TempSrc:  Oral  Oral  SpO2: 99% 96% 95% 97%  Weight:      Height:        Intake/Output Summary (Last 24 hours) at 10/15/2020 1114 Last data filed at 10/15/2020 0600 Gross per 24 hour  Intake 1546.84 ml  Output 1200 ml  Net 346.84 ml   Filed Weights   10/11/20 0453 10/12/20 0447 10/14/20 0235  Weight: 134.2 kg 135.6 kg 130.5 kg    Exam:  . General: 60 y.o. year-old male obese no acute distress.  Alert and oriented x3. . Cardiovascular: Regular rate and rhythm no rubs or gallops.  Marland Kitchen Respiratory: Mild rales at bases no wheezing noted.  [place] effort. . Abdomen: Obese nontender nondistended bowel sounds present.  . Musculoskeletal: No lower extremity edema bilaterally.   . Skin: No ulcerative lesions noted. Marland Kitchen Psychiatry: Mood is appropriate for condition and setting.  Data Reviewed: CBC: Recent Labs  Lab 10/11/20 0451 10/12/20 0446 10/13/20 0322 10/14/20 0454 10/15/20 0427  WBC 8.0 6.6 8.1 8.9 10.6*  NEUTROABS 5.3 4.3 5.8 6.2 7.7  HGB 13.8 14.0 14.6 14.3 15.4  HCT 41.8 42.8 45.0 44.5 47.5  MCV 93.7 91.6 92.0 93.5 92.4  PLT 186 212 238 262 196   Basic Metabolic Panel: Recent Labs  Lab 10/09/20 0321 10/10/20 0415 10/11/20 0451 10/12/20 0446 10/13/20 0322 10/14/20 0454 10/15/20 0427  NA 142 143 131* 140  --  140  --   K 3.1* 3.7 3.0* 3.7  --  3.5 3.7  CL 95* 97* 86* 94*  --  99  --   CO2 36* 37* 25 34*  --  31  --   GLUCOSE 109* 127* 255* 90  --  106*  --   BUN '18 14 23 ' 25*  --  16  --   CREATININE 0.52* 0.46* 0.61 0.77  --  0.67  --   CALCIUM 8.9 8.6* 8.0* 8.9  --  9.2  --   MG 1.7 1.8 1.7 1.9 1.8 1.9 1.6*  PHOS  2.0* 3.0 2.6 3.6 3.1 2.7 2.5   GFR: Estimated Creatinine Clearance: 131.7 mL/min (by C-G formula based on SCr of 0.67 mg/dL). Liver Function Tests: Recent Labs  Lab 10/09/20 0321 10/10/20 0415 10/11/20 0451 10/12/20 0446  AST 46* 55* 45* 46*  ALT 59* 73* 64* 71*  ALKPHOS 34* 44 34* 38  BILITOT 0.9 0.8 2.5* 1.0  PROT 6.5 6.9 5.4* 6.1*  ALBUMIN 2.9* 3.0* 2.5* 2.7*   No results for input(s): LIPASE, AMYLASE in the last 168 hours. No results for input(s): AMMONIA in the last 168 hours. Coagulation Profile: Recent Labs  Lab 10/10/20 2326  INR 1.1   Cardiac Enzymes: No results for input(s): CKTOTAL, CKMB, CKMBINDEX, TROPONINI in the last 168 hours. BNP (last 3 results) No results for input(s): PROBNP in the last 8760 hours. HbA1C: No results for input(s): HGBA1C in the last 72 hours. CBG: Recent Labs  Lab 10/14/20 1601 10/14/20  1921 10/14/20 2319 10/15/20 0414 10/15/20 0752  GLUCAP 129* 123* 111* 93 88   Lipid Profile: Recent Labs    10/13/20 0322  TRIG 197*   Thyroid Function Tests: No results for input(s): TSH, T4TOTAL, FREET4, T3FREE, THYROIDAB in the last 72 hours. Anemia Panel: No results for input(s): VITAMINB12, FOLATE, FERRITIN, TIBC, IRON, RETICCTPCT in the last 72 hours. Urine analysis:    Component Value Date/Time   COLORURINE YELLOW (A) 09/21/2020 1327   APPEARANCEUR HAZY (A) 09/21/2020 1327   LABSPEC 1.022 09/21/2020 1327   PHURINE 5.0 09/21/2020 1327   GLUCOSEU NEGATIVE 09/21/2020 1327   HGBUR MODERATE (A) 09/21/2020 1327   BILIRUBINUR NEGATIVE 09/21/2020 1327   KETONESUR NEGATIVE 09/21/2020 1327   PROTEINUR >=300 (A) 09/21/2020 1327   NITRITE NEGATIVE 09/21/2020 1327   LEUKOCYTESUR NEGATIVE 09/21/2020 1327   Sepsis Labs: '@LABRCNTIP' (procalcitonin:4,lacticidven:4)  )No results found for this or any previous visit (from the past 240 hour(s)).    Studies: No results found.  Scheduled Meds: . chlorhexidine gluconate (MEDLINE KIT)  15 mL  Mouth Rinse BID  . Chlorhexidine Gluconate Cloth  6 each Topical Daily  . docusate  200 mg Oral BID  . enoxaparin (LOVENOX) injection  0.5 mg/kg Subcutaneous Q24H  . feeding supplement  237 mL Oral BID BM  . fentaNYL (SUBLIMAZE) injection  50 mcg Intravenous Once  . insulin aspart  0-15 Units Subcutaneous Q4H  . ipratropium-albuterol  3 mL Nebulization TID  . metoprolol succinate  100 mg Oral Daily  . multivitamin with minerals  1 tablet Oral Daily  . pantoprazole (PROTONIX) IV  40 mg Intravenous Q24H  . polyethylene glycol  17 g Oral Daily  . sacubitril-valsartan  1 tablet Oral BID  . sodium chloride flush  10-40 mL Intracatheter Q12H    Continuous Infusions: . sodium chloride 250 mL (10/09/20 0506)  . sodium chloride 10 mL/hr at 10/14/20 1345  . sodium chloride Stopped (10/10/20 1444)  . piperacillin-tazobactam (ZOSYN)  IV 3.375 g (10/15/20 0640)  . sodium chloride       LOS: 24 days     Kayleen Memos, MD [facility] Pager [phone]  If 7PM-7AM, please contact night-coverage [email or web address] Password [email or web address] 10/15/2020, 11:14 AM

## 2020-10-15 NOTE — Progress Notes (Signed)
PHARMACY CONSULT NOTE - FOLLOW UP  Pharmacy Consult for Electrolyte Monitoring and Replacement   Recent Labs: Potassium (mmol/L)  Date Value  10/15/2020 3.7  05/04/2014 4.3   Magnesium (mg/dL)  Date Value  10/15/2020 1.6 (L)   Calcium (mg/dL)  Date Value  10/14/2020 9.2   Calcium, Total (mg/dL)  Date Value  05/04/2014 9.1   Albumin (g/dL)  Date Value  10/12/2020 2.7 (L)   Phosphorus (mg/dL)  Date Value  10/15/2020 2.5   Sodium (mmol/L)  Date Value  10/14/2020 140  05/04/2014 140   Assessment: 60 year old male presented with AMS due to septic shock d/t PNA. PMH includes OSA and CHF. Patient has been on diuresis with electrolyte abnormalities. Pharmacy to manage electrolytes.  Goal of Therapy:  Electrolytes WNL  Plan:  K 3.7  Mag 1.6  Phos 2.5  Scr  -will order Magnesium sulfate 2 gm IV x 1 Entresto restarted 1/14.  Follow up electrolytes with morning labs.  Noralee Space, PharmD Clinical Pharmacist  10/15/2020 10:19 AM

## 2020-10-15 NOTE — Treatment Plan (Signed)
Pt attempted to jump out of bed after having large BM and urine. Pt caught by nurse walking by as he was on his knees in the bed. MD walked in the room at this time. Pt reoriented to room and where he was. Bath and linen change completed at this time. Tele sitter placed in room.

## 2020-10-16 DIAGNOSIS — I5031 Acute diastolic (congestive) heart failure: Secondary | ICD-10-CM | POA: Diagnosis not present

## 2020-10-16 DIAGNOSIS — Z515 Encounter for palliative care: Secondary | ICD-10-CM | POA: Diagnosis not present

## 2020-10-16 LAB — CBC WITH DIFFERENTIAL/PLATELET
Abs Immature Granulocytes: 0.06 10*3/uL (ref 0.00–0.07)
Basophils Absolute: 0 10*3/uL (ref 0.0–0.1)
Basophils Relative: 0 %
Eosinophils Absolute: 0.1 10*3/uL (ref 0.0–0.5)
Eosinophils Relative: 1 %
HCT: 46.4 % (ref 39.0–52.0)
Hemoglobin: 15.1 g/dL (ref 13.0–17.0)
Immature Granulocytes: 1 %
Lymphocytes Relative: 16 %
Lymphs Abs: 1.4 10*3/uL (ref 0.7–4.0)
MCH: 29.9 pg (ref 26.0–34.0)
MCHC: 32.5 g/dL (ref 30.0–36.0)
MCV: 91.9 fL (ref 80.0–100.0)
Monocytes Absolute: 1.2 10*3/uL — ABNORMAL HIGH (ref 0.1–1.0)
Monocytes Relative: 13 %
Neutro Abs: 6.3 10*3/uL (ref 1.7–7.7)
Neutrophils Relative %: 69 %
Platelets: 274 10*3/uL (ref 150–400)
RBC: 5.05 MIL/uL (ref 4.22–5.81)
RDW: 13 % (ref 11.5–15.5)
WBC: 9 10*3/uL (ref 4.0–10.5)
nRBC: 0 % (ref 0.0–0.2)

## 2020-10-16 LAB — BASIC METABOLIC PANEL
Anion gap: 8 (ref 5–15)
BUN: 14 mg/dL (ref 8–23)
CO2: 31 mmol/L (ref 22–32)
Calcium: 8.8 mg/dL — ABNORMAL LOW (ref 8.9–10.3)
Chloride: 99 mmol/L (ref 98–111)
Creatinine, Ser: 0.64 mg/dL (ref 0.61–1.24)
GFR, Estimated: >60 mL/min (ref >60)
Glucose, Bld: 101 mg/dL — ABNORMAL HIGH (ref 70–99)
Potassium: 3.7 mmol/L (ref 3.5–5.1)
Sodium: 138 mmol/L (ref 135–145)

## 2020-10-16 LAB — GLUCOSE, CAPILLARY
Glucose-Capillary: 101 mg/dL — ABNORMAL HIGH (ref 70–99)
Glucose-Capillary: 102 mg/dL — ABNORMAL HIGH (ref 70–99)
Glucose-Capillary: 103 mg/dL — ABNORMAL HIGH (ref 70–99)
Glucose-Capillary: 106 mg/dL — ABNORMAL HIGH (ref 70–99)
Glucose-Capillary: 131 mg/dL — ABNORMAL HIGH (ref 70–99)
Glucose-Capillary: 196 mg/dL — ABNORMAL HIGH (ref 70–99)
Glucose-Capillary: 98 mg/dL (ref 70–99)

## 2020-10-16 LAB — MAGNESIUM: Magnesium: 1.9 mg/dL (ref 1.7–2.4)

## 2020-10-16 LAB — PHOSPHORUS: Phosphorus: 3.5 mg/dL (ref 2.5–4.6)

## 2020-10-16 MED ORDER — CHLORHEXIDINE GLUCONATE CLOTH 2 % EX PADS
6.0000 | MEDICATED_PAD | Freq: Every day | CUTANEOUS | Status: DC
  Administered 2020-10-16 – 2020-10-18 (×3): 6 via TOPICAL

## 2020-10-16 MED ORDER — POTASSIUM CHLORIDE CRYS ER 20 MEQ PO TBCR
40.0000 meq | EXTENDED_RELEASE_TABLET | Freq: Once | ORAL | Status: AC
  Administered 2020-10-16: 40 meq via ORAL
  Filled 2020-10-16: qty 2

## 2020-10-16 MED ORDER — PANTOPRAZOLE SODIUM 40 MG PO TBEC
40.0000 mg | DELAYED_RELEASE_TABLET | Freq: Every day | ORAL | Status: DC
  Administered 2020-10-16 – 2020-10-20 (×5): 40 mg via ORAL
  Filled 2020-10-16 (×5): qty 1

## 2020-10-16 NOTE — Progress Notes (Signed)
   10/14/20 1500  Assess: MEWS Score  BP (!) 167/77  Pulse Rate (!) 104  SpO2 97 %  O2 Device Room Air  Assess: MEWS Score  MEWS Temp 0  MEWS Systolic 0  MEWS Pulse 1  MEWS RR 1  MEWS LOC 0  MEWS Score 2  MEWS Score Color Yellow  Assess: if the MEWS score is Yellow or Red  Were vital signs taken at a resting state? Yes  Focused Assessment No change from prior assessment  Early Detection of Sepsis Score *See Row Information* Low  MEWS guidelines implemented *See Row Information* Yes  Treat  MEWS Interventions Escalated (See documentation below)  Take Vital Signs  Increase Vital Sign Frequency  Yellow: Q 2hr X 2 then Q 4hr X 2, if remains yellow, continue Q 4hrs  Escalate  MEWS: Escalate Yellow: discuss with charge nurse/RN and consider discussing with provider and RRT  Notify: Charge Nurse/RN  Name of Charge Nurse/RN Notified Hilbert Odor RN  Date Charge Nurse/RN Notified 10/14/20  Time Charge Nurse/RN Notified 1530  Notify: Provider  Provider Name/Title Nevada Crane  Date Provider Notified 10/14/20  Time Provider Notified 1531  Notification Type Page  Notification Reason Change in status  Response No new orders  Date of Provider Response 10/14/20  Time of Provider Response [id number]  Inserted for [place]

## 2020-10-16 NOTE — Treatment Plan (Signed)
Md ordered ambulating oxygen at this time, PT consulted to help . Per PT: we did some side stepping and forward/backwards walking. Is not safe for ambulating > 6 ft away from bed at this time but HR did go up to 130, RR > 33 and Spow remained at 90% so returned him to bed after RR would not stop climbing.   MD Nevada Crane made aware via secure chat.

## 2020-10-16 NOTE — Progress Notes (Signed)
Physical Therapy Treatment Patient Details Name: Eddie Chapman MRN: [id number] DOB: 08-22-1961 Today's Date: 10/16/2020    History of Present Illness [name] admitted with SEVERE Acute hypoxemic respiratory failure due to CT chest with impressive Left lung pneumonia complicated by Acute metabolic encephalopathy related to critical illness,  Acute kidney injury due to ATN, associated with morbid obesity. Was extubated and transferred to [place].  On 10/11/19 he is unresponsive with snoring respirations and copious secretions requiring intubation for airway protection following Ativan administration. 60 yo M with hx of cerebral aneurysm, PVD, HFrEF, pulmonary htn, morbid obesity, OSA, GERD, hx of TBI, dyslipidemia who came in after girlfriend found him to be unresponsive.  He was found to be severely hypoxemic in circulatory shock with left lung infiltrate on CXR. Labwork consistent with sepsis. Extubated on 10/13/20. Currently on 3L with O2 saturation in the mid 90's.    PT Comments    Patient is in bed upon PT arrival. Agreeable to participate in therapy session. Vitals monitored throughout session with Sp02 specifically per physician/nursing request. Patient transitioned EOB with assistance. Seated EOB requires cues for safety awareness and frequent task orientation due to patient's tangential and verbose nature. Patient perform STS with assistance and raised surface. Vitals remained functional initially with static balance and marching. Is able to ambulate forward/backwards with mod/max A however ambulation attempt terminated due to HR >130, RR>33, and Sp02 dropping to 90%. Patient returned to bed with all needs met. Current POC remains appropriate at this time.     Follow Up Recommendations  CIR     Equipment Recommendations  None recommended by PT (TBD in next venue)    Recommendations for Other Services       Precautions / Restrictions Precautions Precautions:  Fall Restrictions Weight Bearing Restrictions: No    Mobility  Bed Mobility Overal bed mobility: Needs Assistance Bed Mobility: Supine to Sit;Sit to Supine     Supine to sit: Min guard;Min assist;HOB elevated Sit to supine: HOB elevated;Min assist   General bed mobility comments: Extra time, use of bed rails, assistance to fully upright trunk for supine>sit  Transfers Overall transfer level: Needs assistance Equipment used: Rolling walker (2 wheeled) Transfers: Sit to/from Stand Sit to Stand: Min assist;From elevated surface         General transfer comment: Patient requires cues for safety, hand placement, and elevation of surface  Ambulation/Gait Ambulation/Gait assistance: Mod assist Gait Distance (Feet): 4 Feet Assistive device: Rolling walker (2 wheeled) Gait Pattern/deviations: Decreased step length - right;Decreased step length - left     General Gait Details: Patient requires max cueing for safety as well as assistance with AD. terminated due to rapidly rising RR and HR.   Stairs             Wheelchair Mobility    Modified Rankin (Stroke Patients Only)       Balance Overall balance assessment: Needs assistance Sitting-balance support: Feet supported;Bilateral upper extremity supported Sitting balance-Leahy Scale: Fair Sitting balance - Comments: supervision sitting EOB Postural control: Left lateral lean;Posterior lean Standing balance support: Bilateral upper extremity supported Standing balance-Leahy Scale: Poor Standing balance comment: BUE reliance on RW                            Cognition Arousal/Alertness: Awake/alert Behavior During Therapy: Impulsive Overall Cognitive Status: History of cognitive impairments - at baseline  General Comments: Patient is very verbose and tangential. Impaired safety awareness      Exercises General Exercises - Lower Extremity Long Arc Quad:  AROM;Strengthening;Both;10 reps;Seated Hip Flexion/Marching: AROM;Strengthening;Both;10 reps;Seated Other Exercises Other Exercises: Sit to stand x2 with RW and cues for safety and mobility, Standing marches x 7 each foot terminated due to patient's safety and vitals rapidly rising. Dynamic standing and sitting x 3 minutes each position    General Comments General comments (skin integrity, edema, etc.): HR 130 during mobility Sp02 90-93% on 1 L via nasal cannula, RR 33      Pertinent Vitals/Pain Pain Assessment: No/denies pain    Home Living                      Prior Function            PT Goals (current goals can now be found in the care plan section) Acute Rehab PT Goals Patient Stated Goal: return to PLOF PT Goal Formulation: With patient/family Time For Goal Achievement: 10/28/20 Potential to Achieve Goals: Good Progress towards PT goals: Progressing toward goals    Frequency    Min 2X/week      PT Plan Current plan remains appropriate    Co-evaluation              AM-PAC PT "6 Clicks" Mobility   Outcome Measure  Help needed turning from your back to your side while in a flat bed without using bedrails?: A Lot Help needed moving from lying on your back to sitting on the side of a flat bed without using bedrails?: A Lot Help needed moving to and from a bed to a chair (including a wheelchair)?: A Lot Help needed standing up from a chair using your arms (e.g., wheelchair or bedside chair)?: A Lot Help needed to walk in hospital room?: Total Help needed climbing 3-5 steps with a railing? : Total 6 Click Score: 10    End of Session Equipment Utilized During Treatment: Gait belt;Oxygen (1 L via nasal cannula) Activity Tolerance: Patient tolerated treatment well Patient left: in bed;with call bell/phone within reach;with bed alarm set Nurse Communication: Mobility status (vitals) PT Visit Diagnosis: Muscle weakness (generalized) (M62.81);Difficulty  in walking, not elsewhere classified (R26.2)     Time: 7505-1833 PT Time Calculation (min) (ACUTE ONLY): 32 min  Charges:  $Therapeutic Activity: 8-22 mins $Neuromuscular Re-education: 8-22 mins                    Janna Arch, PT, DPT   10/16/2020, 11:59 AM

## 2020-10-16 NOTE — Progress Notes (Signed)
Patient continues to be put on bipap every night by RT department.
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
## 2020-10-16 NOTE — Progress Notes (Signed)
PROGRESS NOTE  Eddie Chapman RSW:[id number] DOB: 02/15/1961 DOA: 09/21/2020 PCP: Adin Hector, MD  HPI/Recap of past 24 hours: [name] hx of cerebral aneurysm, PVD, HFrEF, pulmonary htn, morbid obesity, OSA, GERD, hx of TBI, dyslipidemia who was found unresponsive by his girlfriend at home.  Admitted withSEVEREAcute hypoxemic respiratory failurerequiring intubation and invasive mechanical ventilation due to left lower lobe community-acquired pneumonia complicated byAcute metabolic encephalopathy related to critical illness, Acute kidney injury due to ATN. Was extubated and transferred to [place] on 10/10/20, however he became unresponsivewith snoringrespirations and copioussecretionsrequiring intubation for airway protectionafter Ativan administration.  He was extubated on 10/13/2020.  Oxygen requirement is improving.  [facility] with O2 sat in the mid 90's.  10/16/20: Seen and examined at his bedside this morning.  He is alert and oriented x3.  He has no new complaints.  He wore his BiPAP last night.  PT recommended CIR.  Awaiting placement.  Assessment/Plan: Active Problems:   Essential hypertension   Septic shock (HCC)   Acute respiratory failure with hypoxia and hypercapnia (HCC)   Pneumonia involving left lung   OSA (obstructive sleep apnea)   Obesity hypoventilation syndrome (HCC)   Acute metabolic encephalopathy   Acute diastolic CHF (congestive heart failure) (HCC)   AKI (acute kidney injury) ([place])   Morbid obesity with BMI of 40.0-44.9, adult ([place])  Resolved severe ACUTE Hypoxic and Hypercapnic Respiratory Failure secondary to left lower lobe community-acquired pneumonia, requiring intubation with invasive mechanical ventilation. Patient was initially found unresponsive at home by his girlfriend. Admission chest x-ray revealed significant infiltrate involving left lower lobe, personally reviewed.   He is on day 5/5 of Zosyn, there was concern for possible  aspiration, He was seen by speech therapist recommendation initially for dysphagia 1 diet, advanced to dysphagia 3 diet, tolerating well. He is down to 1 L from 3 L nasal cannula with O2 saturation in the mid 90s. Continue to wean off oxygen supplementation as tolerated. Home oxygen evaluation prior to DC.  Hypercarbia with concern for OHS Obtained ABG on 10/15/2020, showing mild hypercarbia PCO2 58 with corrected pH 7.4 Continue BiPAP at night  ACUTE DIASTOLIC CARDIAC FAILURE-  Continue current cardiac medications, he is on Entresto Continue to follow up cardiology recs Last 2D echo done on 09/22/2020 showed normal LVEF 50 to 55% Continue strict I's and O's and daily weight Net I&O -5.9 L>> -3.1 L  Elevated troponin, likely demand ischemia in the setting of severe acute hypoxic and hypercarbic respiratory failure  Troponin peaked at 56 and trended down  2D echo 09/22/20 preserved LVEF 50-55% Seen by cardiology Denies any anginal symptoms at the time of this visit.  Possible aspiration pneumonia, POA Patient is currently on Zosyn, complete 5 days course. Was seen by speech therapist His diet has been advanced from dysphagia 1 to dysphagia 3 diet. Continue aspiration precautions  Possible OSA Has BIPAP ordered qhs Will need to f/u with pulmonary outpatient for possible sleep study Continue BiPAP nightly  Morbid obesity.  BMI 41 Recommend weight loss outpatient with healthy dieting and regular physical activity.  Physical debility PT recommending CIR Fall precautions. Inpatient rehab consulted for possible admission   Code Status: Full code  Family Communication: None at bedside  Disposition Plan: CIR   Consultants:  PCCM  Procedures:  Extubation 10/13/2020  Antimicrobials:  Zosyn  DVT prophylaxis: Subcu Lovenox daily  Status is: Inpatient    Dispo: The patient is from: Home  Anticipated d/c is to: CIR              Anticipated d/c  date is: 10/17/2020              Patient currently stable for DC, pending CIR placement.        Objective: Vitals:   10/16/20 1152 10/16/20 1153 10/16/20 1200 10/16/20 1407  BP:      Pulse:      Resp: (!) 33 (!) 33 (!) 21   Temp:      TempSrc:      SpO2: 90% 90%  93%  Weight:      Height:        Intake/Output Summary (Last 24 hours) at 10/16/2020 1444 Last data filed at 10/16/2020 1324 Gross per 24 hour  Intake 1853.69 ml  Output 800 ml  Net 1053.69 ml   Filed Weights   10/12/20 0447 10/14/20 0235 10/16/20 0451  Weight: 135.6 kg 130.5 kg 128.5 kg    Exam:  . General: 60 y.o. year-old male obese in no acute distress.  Alert and oriented x3.   . Cardiovascular: Regular rate and rhythm no rubs or gallops. Marland Kitchen Respiratory: Clear to auscultation no wheezes or rales.   . Abdomen: Obese nontender normal bowel sounds present. . Musculoskeletal: No lower extremity edema bilaterally. Marland Kitchen Psychiatry: Mood is appropriate for condition   Data Reviewed: CBC: Recent Labs  Lab 10/12/20 0446 10/13/20 0322 10/14/20 0454 10/15/20 0427 10/16/20 0546  WBC 6.6 8.1 8.9 10.6* 9.0  NEUTROABS 4.3 5.8 6.2 7.7 6.3  HGB 14.0 14.6 14.3 15.4 15.1  HCT 42.8 45.0 44.5 47.5 46.4  MCV 91.6 92.0 93.5 92.4 91.9  PLT 212 238 262 275 062   Basic Metabolic Panel: Recent Labs  Lab 10/10/20 0415 10/11/20 0451 10/12/20 0446 10/13/20 0322 10/14/20 0454 10/15/20 0427 10/16/20 0546  NA 143 131* 140  --  140  --  138  K 3.7 3.0* 3.7  --  3.5 3.7 3.7  CL 97* 86* 94*  --  99  --  99  CO2 37* 25 34*  --  31  --  31  GLUCOSE 127* 255* 90  --  106*  --  101*  BUN 14 23 25*  --  16  --  14  CREATININE 0.46* 0.61 0.77  --  0.67  --  0.64  CALCIUM 8.6* 8.0* 8.9  --  9.2  --  8.8*  MG 1.8 1.7 1.9 1.8 1.9 1.6* 1.9  PHOS 3.0 2.6 3.6 3.1 2.7 2.5 3.5   GFR: Estimated Creatinine Clearance: 130.6 mL/min (by C-G formula based on SCr of 0.64 mg/dL). Liver Function Tests: Recent Labs  Lab 10/10/20 0415  10/11/20 0451 10/12/20 0446  AST 55* 45* 46*  ALT 73* 64* 71*  ALKPHOS 44 34* 38  BILITOT 0.8 2.5* 1.0  PROT 6.9 5.4* 6.1*  ALBUMIN 3.0* 2.5* 2.7*   No results for input(s): LIPASE, AMYLASE in the last 168 hours. No results for input(s): AMMONIA in the last 168 hours. Coagulation Profile: Recent Labs  Lab 10/10/20 2326  INR 1.1   Cardiac Enzymes: No results for input(s): CKTOTAL, CKMB, CKMBINDEX, TROPONINI in the last 168 hours. BNP (last 3 results) No results for input(s): PROBNP in the last 8760 hours. HbA1C: No results for input(s): HGBA1C in the last 72 hours. CBG: Recent Labs  Lab 10/15/20 1955 10/16/20 0054 10/16/20 0407 10/16/20 0729 10/16/20 1109  GLUCAP 115* 101* 98 103* 196*   Lipid  Profile: No results for input(s): CHOL, HDL, LDLCALC, TRIG, CHOLHDL, LDLDIRECT in the last 72 hours. Thyroid Function Tests: No results for input(s): TSH, T4TOTAL, FREET4, T3FREE, THYROIDAB in the last 72 hours. Anemia Panel: No results for input(s): VITAMINB12, FOLATE, FERRITIN, TIBC, IRON, RETICCTPCT in the last 72 hours. Urine analysis:    Component Value Date/Time   COLORURINE YELLOW (A) 09/21/2020 1327   APPEARANCEUR HAZY (A) 09/21/2020 1327   LABSPEC 1.022 09/21/2020 1327   PHURINE 5.0 09/21/2020 1327   GLUCOSEU NEGATIVE 09/21/2020 1327   HGBUR MODERATE (A) 09/21/2020 1327   BILIRUBINUR NEGATIVE 09/21/2020 1327   KETONESUR NEGATIVE 09/21/2020 1327   PROTEINUR >=300 (A) 09/21/2020 1327   NITRITE NEGATIVE 09/21/2020 1327   LEUKOCYTESUR NEGATIVE 09/21/2020 1327   Sepsis Labs: '@LABRCNTIP' (procalcitonin:4,lacticidven:4)  )No results found for this or any previous visit (from the past 240 hour(s)).    Studies: No results found.  Scheduled Meds: . chlorhexidine gluconate (MEDLINE KIT)  15 mL Mouth Rinse BID  . Chlorhexidine Gluconate Cloth  6 each Topical Daily  . enoxaparin (LOVENOX) injection  0.5 mg/kg Subcutaneous Q24H  . feeding supplement  237 mL Oral BID  BM  . insulin aspart  0-15 Units Subcutaneous Q4H  . ipratropium-albuterol  3 mL Nebulization TID  . metoprolol succinate  100 mg Oral Daily  . multivitamin with minerals  1 tablet Oral Daily  . pantoprazole  40 mg Oral Daily  . sacubitril-valsartan  1 tablet Oral BID  . sodium chloride flush  10-40 mL Intracatheter Q12H    Continuous Infusions: . sodium chloride Stopped (10/16/20 1321)  . piperacillin-tazobactam (ZOSYN)  IV 12.5 mL/hr at 10/16/20 1324  . sodium chloride       LOS: 25 days     Kayleen Memos, MD [facility] Pager [phone]  If 7PM-7AM, please contact night-coverage [email or web address] Password [facility] 10/16/2020, 2:44 PM

## 2020-10-16 NOTE — Progress Notes (Signed)
[place] for Electrolyte Monitoring and Replacement   Recent Labs: Potassium (mmol/L)  Date Value  10/16/2020 3.7  05/04/2014 4.3   Magnesium (mg/dL)  Date Value  10/16/2020 1.9   Calcium (mg/dL)  Date Value  10/16/2020 8.8 (L)   Calcium, Total (mg/dL)  Date Value  05/04/2014 9.1   Albumin (g/dL)  Date Value  10/12/2020 2.7 (L)   Phosphorus (mg/dL)  Date Value  10/16/2020 3.5   Sodium (mmol/L)  Date Value  10/16/2020 138  05/04/2014 140   Corrected Ca: 9.8 mg/dL  Assessment: 60 year old male presented with AMS due to septic shock d/t PNA. PMH includes OSA and CHF. Patient has been on diuresis with electrolyte abnormalities. Pharmacy to manage electrolytes.  Goal of Therapy:  Electrolytes WNL  Plan:   No electrolyte replacement warranted for today  Follow up electrolytes with morning labs.  [name], PharmD Clinical Pharmacist  10/16/2020 7:26 AM

## 2020-10-16 NOTE — Progress Notes (Addendum)
Inpatient Rehab Admissions Coordinator:   Spoke to patient over the phone regarding pursing potential inpatient rehab admission.  Pt tangential and distracted throughout conversation.  Demonstrates decreased awareness of deficits and their impact on his safe return home.  It seems he may be open to CIR for short term rehab, but did go back and forth some.  Will try to call his significant other back this afternoon (she was driving when I called) to discuss CIR again.    Addendum: Spoke to pt's significant other, [name].  They are still unsure whether they would like to pursue CIR.  Would like to see how he does with ambulation (note not successful with PT today), to see if it's feasible for him to transition home with home health.  Will touch base with therapy about possibility of seeing patient tomorrow, too.   Shann Medal, PT, DPT Admissions Coordinator [phone] 10/16/20  11:13 AM

## 2020-10-17 LAB — CBC WITH DIFFERENTIAL/PLATELET
Abs Immature Granulocytes: 0.07 10*3/uL (ref 0.00–0.07)
Basophils Absolute: 0 10*3/uL (ref 0.0–0.1)
Basophils Relative: 0 %
Eosinophils Absolute: 0.1 10*3/uL (ref 0.0–0.5)
Eosinophils Relative: 1 %
HCT: 47 % (ref 39.0–52.0)
Hemoglobin: 15.1 g/dL (ref 13.0–17.0)
Immature Granulocytes: 1 %
Lymphocytes Relative: 16 %
Lymphs Abs: 1.5 10*3/uL (ref 0.7–4.0)
MCH: 29.7 pg (ref 26.0–34.0)
MCHC: 32.1 g/dL (ref 30.0–36.0)
MCV: 92.5 fL (ref 80.0–100.0)
Monocytes Absolute: 1.2 10*3/uL — ABNORMAL HIGH (ref 0.1–1.0)
Monocytes Relative: 14 %
Neutro Abs: 6.1 10*3/uL (ref 1.7–7.7)
Neutrophils Relative %: 68 %
Platelets: 276 10*3/uL (ref 150–400)
RBC: 5.08 MIL/uL (ref 4.22–5.81)
RDW: 13.2 % (ref 11.5–15.5)
WBC: 9 10*3/uL (ref 4.0–10.5)
nRBC: 0 % (ref 0.0–0.2)

## 2020-10-17 LAB — GLUCOSE, CAPILLARY
Glucose-Capillary: 106 mg/dL — ABNORMAL HIGH (ref 70–99)
Glucose-Capillary: 87 mg/dL (ref 70–99)
Glucose-Capillary: 113 mg/dL — ABNORMAL HIGH (ref 70–99)
Glucose-Capillary: 148 mg/dL — ABNORMAL HIGH (ref 70–99)
Glucose-Capillary: 117 mg/dL — ABNORMAL HIGH (ref 70–99)
Glucose-Capillary: 174 mg/dL — ABNORMAL HIGH (ref 70–99)
Glucose-Capillary: 110 mg/dL — ABNORMAL HIGH (ref 70–99)

## 2020-10-17 LAB — BASIC METABOLIC PANEL
Anion gap: 9 (ref 5–15)
BUN: 13 mg/dL (ref 8–23)
CO2: 30 mmol/L (ref 22–32)
Calcium: 9.3 mg/dL (ref 8.9–10.3)
Chloride: 99 mmol/L (ref 98–111)
Creatinine, Ser: 0.68 mg/dL (ref 0.61–1.24)
GFR, Estimated: >60 mL/min (ref >60)
Glucose, Bld: 104 mg/dL — ABNORMAL HIGH (ref 70–99)
Potassium: 4 mmol/L (ref 3.5–5.1)
Sodium: 138 mmol/L (ref 135–145)

## 2020-10-17 LAB — MAGNESIUM: Magnesium: 1.8 mg/dL (ref 1.7–2.4)

## 2020-10-17 LAB — PHOSPHORUS: Phosphorus: 3.6 mg/dL (ref 2.5–4.6)
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
## 2020-10-17 NOTE — Progress Notes (Signed)
[place] for Electrolyte Monitoring and Replacement   Recent Labs: Potassium (mmol/L)  Date Value  10/17/2020 4.0  05/04/2014 4.3   Magnesium (mg/dL)  Date Value  10/17/2020 1.8   Calcium (mg/dL)  Date Value  10/17/2020 9.3   Calcium, Total (mg/dL)  Date Value  05/04/2014 9.1   Albumin (g/dL)  Date Value  10/12/2020 2.7 (L)   Phosphorus (mg/dL)  Date Value  10/17/2020 3.6   Sodium (mmol/L)  Date Value  10/17/2020 138  05/04/2014 140    Assessment: 60 year old male presented with AMS due to septic shock d/t PNA. PMH includes OSA and CHF. Patient has been on diuresis with electrolyte abnormalities. Pharmacy to manage electrolytes.  Goal of Therapy:  Electrolytes WNL  Plan:   No electrolyte replacement warranted for today  Follow up electrolytes with morning labs.  [name], PharmD Clinical Pharmacist  10/17/2020 7:12 AM

## 2020-10-17 NOTE — Care Management Important Message (Signed)
Important Message  Patient Details  Name: LOTT SEELBACH MRN: [id number] Date of Birth: [birth year]   Medicare Important Message Given:  Yes     Dannette Barbara 10/17/2020, 10:55 AM

## 2020-10-17 NOTE — TOC Progression Note (Signed)
Transition of Care [facility]) - Progression Note    Patient Details  Name: CRIT OBREMSKI MRN: [id number] Date of Birth: [birth year]  [facility] CM/SW Contact  Beverly Sessions, RN Phone Number: 10/17/2020, 3:46 PM  Clinical Narrative:     Patient agreeable for CIR if needed.  He states "I want to do whatever I need to do to get home" Patient would like to see how he does today with PT to determine if there is any potential for him to progress to home health recommendation        Expected Discharge Plan and Services                                                 Social Determinants of Health (SDOH) Interventions    Readmission Risk Interventions No flowsheet data found.

## 2020-10-17 NOTE — Progress Notes (Signed)
PROGRESS NOTE    Eddie Chapman   PJK:[id number]  DOB: 03/14/1961  DOA: 09/21/2020     25  PCP: Adin Hector, MD  CC: found unresponsive at home   Hospital Course: [name] hx of cerebral aneurysm, PVD, HFrEF, pulmonary htn, morbid obesity, OSA, GERD, hx of TBI, dyslipidemia who was found unresponsive by his girlfriend at home.  Admitted withAcute hypoxemic respiratory failurerequiring intubation due to left lower lobe community-acquired pneumonia complicated byAcute metabolic encephalopathy related to critical illness, Acute kidney injury due to ATN.  Was extubated and transferred to [place] on 10/10/20, however he became unresponsivewith snoringrespirations and copioussecretionsrequiring reintubation for airway protectionafter Ativan administration.  He was extubated on 10/13/2020.  Oxygen requirement is improving.  Down to [facility] with O2 sat in the mid 90's.  Interval History:  Tentative plan is CIR if able. He certainly is motivated for rehab however he needs/is recommended.  His girlfriend is bedside this morning and also updated along with the patient.  No other events overnight and he seems to be resting and breathing comfortably.  Oxygen remains at 1 L.  Old records reviewed in assessment of this patient  ROS: Constitutional: negative for chills and fevers, Respiratory: negative for cough, Cardiovascular: negative for chest pain and Gastrointestinal: negative for abdominal pain  Assessment & Plan: Resolved acute Hypoxic and Hypercapnic Respiratory Failure secondary to left lower lobe community-acquired pneumonia Patient was initially found unresponsive at home by his girlfriend. Admission chest x-ray revealed significant infiltrate involving left lower lobe - completed course of zosyn - followed by SLP; currently on dys 3 diet -Continue weaning oxygen, currently on 1 L and not on oxygen chronically at home -Continue incentive spirometer and  flutter  Hypercarbia with concern for OHS/OSA Obtained ABG on 10/15/2020, showing mild hypercarbia PCO2 58 with corrected pH 7.4 Continue BiPAP at night -Needs outpatient sleep study if not previously done  Acute on chronic diastolic CHF Continue current cardiac medications, he is on Entresto Continue to follow up cardiology recs Last 2D echo done on 09/22/2020 showed normal LVEF 50 to 55% Continue strict I's and O's and daily weight  Demand ischemia -Considered due to acute respiratory failure Troponin peaked at 56 and trended down  2D echo 09/22/20 preserved LVEF 50-55% Seen by cardiology - remains CP free  Possible aspiration pneumonia, POA - s/p Zosyn, completed 5 days course. Was seen by speech therapist - continue dys 3 diet per SLP Continue aspiration precautions  Morbid obesity.  BMI 41 Recommend weight loss outpatient with healthy dieting and regular physical activity.  Physical debility PT recommending CIR Fall precautions. Inpatient rehab consulted for possible admission   DVT prophylaxis: Lovenox Code Status: Full Family Communication: g/f bedside Disposition Plan: Status is: Inpatient  Remains inpatient appropriate because:Unsafe d/c plan and Inpatient level of care appropriate due to severity of illness   Dispo:  Patient From: Home  Planned Disposition: Inpatient Rehab  Expected discharge date: 10/18/2020  Medically stable for discharge: No  Objective: Blood pressure 112/76, pulse 96, temperature 98.4 F (36.9 C), temperature source Oral, resp. rate 20, height '5\' 10"'  (1.778 m), weight 128.7 kg, SpO2 93 %.  Examination: General appearance: alert, cooperative and no distress Head: Normocephalic, without obvious abnormality, atraumatic Eyes: EOMI Lungs: Bibasilar crackles, no wheezing Heart: regular rate and rhythm and S1, S2 normal Abdomen: normal findings: bowel sounds normal and soft, non-tender Extremities: No edema Skin: mobility and  turgor normal Neurologic: Grossly normal  Consultants:  Procedures:     Data Reviewed: I have personally reviewed following labs and imaging studies Results for orders placed or performed during the hospital encounter of 09/21/20 (from the past 24 hour(s))  Glucose, capillary     Status: Abnormal   Collection Time: 10/16/20  7:37 PM  Result Value Ref Range   Glucose-Capillary 102 (H) 70 - 99 mg/dL  Glucose, capillary     Status: Abnormal   Collection Time: 10/17/20  1:00 AM  Result Value Ref Range   Glucose-Capillary 106 (H) 70 - 99 mg/dL  CBC with Differential/Platelet     Status: Abnormal   Collection Time: 10/17/20  4:27 AM  Result Value Ref Range   WBC 9.0 4.0 - 10.5 K/uL   RBC 5.08 4.22 - 5.81 MIL/uL   Hemoglobin 15.1 13.0 - 17.0 g/dL   HCT 47.0 39.0 - 52.0 %   MCV 92.5 80.0 - 100.0 fL   MCH 29.7 26.0 - 34.0 pg   MCHC 32.1 30.0 - 36.0 g/dL   RDW 13.2 11.5 - 15.5 %   Platelets 276 150 - 400 K/uL   nRBC 0.0 0.0 - 0.2 %   Neutrophils Relative % 68 %   Neutro Abs 6.1 1.7 - 7.7 K/uL   Lymphocytes Relative 16 %   Lymphs Abs 1.5 0.7 - 4.0 K/uL   Monocytes Relative 14 %   Monocytes Absolute 1.2 (H) 0.1 - 1.0 K/uL   Eosinophils Relative 1 %   Eosinophils Absolute 0.1 0.0 - 0.5 K/uL   Basophils Relative 0 %   Basophils Absolute 0.0 0.0 - 0.1 K/uL   Immature Granulocytes 1 %   Abs Immature Granulocytes 0.07 0.00 - 0.07 K/uL  Magnesium     Status: None   Collection Time: 10/17/20  4:27 AM  Result Value Ref Range   Magnesium 1.8 1.7 - 2.4 mg/dL  Phosphorus     Status: None   Collection Time: 10/17/20  4:27 AM  Result Value Ref Range   Phosphorus 3.6 2.5 - 4.6 mg/dL  Basic metabolic panel     Status: Abnormal   Collection Time: 10/17/20  4:27 AM  Result Value Ref Range   Sodium 138 135 - 145 mmol/L   Potassium 4.0 3.5 - 5.1 mmol/L   Chloride 99 98 - 111 mmol/L   CO2 30 22 - 32 mmol/L   Glucose, Bld 104 (H) 70 - 99 mg/dL   BUN 13 8 - 23 mg/dL   Creatinine, Ser  0.68 0.61 - 1.24 mg/dL   Calcium 9.3 8.9 - 10.3 mg/dL   GFR, Estimated >60 >60 mL/min   Anion gap 9 5 - 15  Glucose, capillary     Status: None   Collection Time: 10/17/20  4:33 AM  Result Value Ref Range   Glucose-Capillary 87 70 - 99 mg/dL  Glucose, capillary     Status: Abnormal   Collection Time: 10/17/20  7:48 AM  Result Value Ref Range   Glucose-Capillary 113 (H) 70 - 99 mg/dL  Glucose, capillary     Status: Abnormal   Collection Time: 10/17/20 12:28 PM  Result Value Ref Range   Glucose-Capillary 148 (H) 70 - 99 mg/dL  Glucose, capillary     Status: Abnormal   Collection Time: 10/17/20  4:03 PM  Result Value Ref Range   Glucose-Capillary 117 (H) 70 - 99 mg/dL    No results found for this or any previous visit (from the past 240 hour(s)).   Radiology Studies: No results  found. [place] EKG SITE RITE  Final Result    DG Chest [facility] 1 View  Final Result    DG Abd 1 View  Final Result    DG Chest Port 1 View  Final Result    DG Chest Port 1 View  Final Result    DG Chest Port 1 View  Final Result    DG CHEST PORT 1 VIEW  Final Result    [place] EKG SITE RITE  Final Result    CT HEAD WO CONTRAST  Final Result    US Venous Img Lower Bilateral (DVT)  Final Result    DG Chest Port 1 View  Final Result    CT CHEST WO CONTRAST  Final Result    CT HEAD WO CONTRAST  Final Result    DG Chest Port 1 View  Final Result    DG Chest Port 1 View  Final Result    DG Abd Portable 1V  Final Result    CT Head Wo Contrast  Final Result    DG Chest Portable 1 View  Final Result      Scheduled Meds: . chlorhexidine gluconate (MEDLINE KIT)  15 mL Mouth Rinse BID  . Chlorhexidine Gluconate Cloth  6 each Topical Daily  . enoxaparin (LOVENOX) injection  0.5 mg/kg Subcutaneous Q24H  . feeding supplement  237 mL Oral BID BM  . insulin aspart  0-15 Units Subcutaneous Q4H  . ipratropium-albuterol  3 mL Nebulization TID  . metoprolol succinate  100 mg Oral Daily  .  multivitamin with minerals  1 tablet Oral Daily  . pantoprazole  40 mg Oral Daily  . sacubitril-valsartan  1 tablet Oral BID  . sodium chloride flush  10-40 mL Intracatheter Q12H   PRN Meds: acetaminophen, docusate, hydrALAZINE, ipratropium-albuterol, labetalol, metoprolol tartrate, polyethylene glycol, sodium chloride, sodium chloride flush Continuous Infusions: . sodium chloride Stopped (10/16/20 1321)  . sodium chloride       LOS: 26 days  Time spent: Greater than 50% of the 35 minute visit was spent in counseling/coordination of care for the patient as laid out in the A&P.   Dwyane Dee, MD [facility] 10/17/2020, 6:11 PM

## 2020-10-17 NOTE — Progress Notes (Signed)
Occupational Therapy Treatment Patient Details Name: Eddie Chapman MRN: [id number] DOB: 08/02/1961 Today's Date: 10/17/2020    History of present illness [name] admitted with SEVERE Acute hypoxemic respiratory failure due to CT chest with impressive Left lung pneumonia complicated by Acute metabolic encephalopathy related to critical illness,  Acute kidney injury due to ATN, associated with morbid obesity. Was extubated and transferred to [place].  On 10/11/19 he is unresponsive with snoring respirations and copious secretions requiring intubation for airway protection following Ativan administration. 60 yo M with hx of cerebral aneurysm, PVD, HFrEF, pulmonary htn, morbid obesity, OSA, GERD, hx of TBI, dyslipidemia who came in after girlfriend found him to be unresponsive.  He was found to be severely hypoxemic in circulatory shock with left lung infiltrate on CXR. Labwork consistent with sepsis. Extubated on 10/13/20. Currently on 3L with O2 saturation in the mid 90's.   OT comments  Eddie Chapman was seen for OT treatment on this date. Upon arrival to room pt reclined in bed c wife present at bedside - reporting recently completed PT session. MAX A for LBD at bed level. SETUP and SUPERVISION hairwashing/brushing seated EOB. Initial sit<>stand attempt pt demonstrates poor insight into deficits - states he can stand on his own, able to clear rear with high amplitude rocking then quickly returns to sitting. Ultimately MOD A + RW to stand from regular surface height. Pt demonstrates further safety deficits by stating he can stand without BUE assist and picks up RW. OT guided pt through static/dynamic balance exercises using single UE support on RW. Pt making good progress toward goals. Pt continues to benefit from skilled OT services to maximize return to PLOF and minimize risk of future falls, injury, caregiver burden, and readmission. Will continue to follow POC. Discharge recommendation remains  appropriate.    Follow Up Recommendations  CIR    Equipment Recommendations  3 in 1 bedside commode    Recommendations for Other Services      Precautions / Restrictions Precautions Precautions: Fall Restrictions Weight Bearing Restrictions: No       Mobility Bed Mobility Overal bed mobility: Needs Assistance Bed Mobility: Supine to Sit;Sit to Supine Rolling: Min assist;Mod assist   Supine to sit: Min assist Sit to supine: Min assist   General bed mobility comments: uses high amplitude rocking movements to achieve sitting  Transfers Overall transfer level: Needs assistance Equipment used: Rolling walker (2 wheeled) Transfers: Sit to/from Stand Sit to Stand: Mod assist         General transfer comment: pt demonstrates poor insight into deficits - states he can stand on his own, clears rear then fails and returns quickly to sitting. MOD A + RW to stand from regular surface    Balance Overall balance assessment: Needs assistance Sitting-balance support: Feet supported;Bilateral upper extremity supported Sitting balance-Leahy Scale: Good     Standing balance support: Bilateral upper extremity supported;During functional activity Standing balance-Leahy Scale: Poor Standing balance comment: pt at high fall risk 2/2 to BLE weakness and poor safety awareness                           ADL either performed or assessed with clinical judgement   ADL Overall ADL's : Needs assistance/impaired  General ADL Comments: MAX A for LBD at bed level. SETUP and SUPERVISION hairwashing/brushing seated EOB. MIN A + RW standing grooming tasks - requires UE support               Cognition Arousal/Alertness: Awake/alert Behavior During Therapy: Impulsive Overall Cognitive Status: History of cognitive impairments - at baseline                                 General Comments: Patient is very verbose  and tangential. Impaired safety awareness        Exercises Exercises: Other exercises Other Exercises Other Exercises: Pt educated re: OT role, DME recs, d/c recs, falls prevention, ECS Other Exercises: hair brushing/washing, LBD, sup<>sit, sit<>stand x2, sitting/standing balance/toelrance   Shoulder Instructions       General Comments SpO2 91-95% on RA during seated activity, desat 89% in standing during conversation (pt does not take many breaks from talking)    Pertinent Vitals/ Pain       Pain Assessment: No/denies pain         Frequency  Min 3X/week        Progress Toward Goals  OT Goals(current goals can now be found in the care plan section)  Progress towards OT goals: Progressing toward goals  Acute Rehab OT Goals Patient Stated Goal: return to PLOF OT Goal Formulation: With patient/family Time For Goal Achievement: 10/28/20 Potential to Achieve Goals: Good ADL Goals Pt Will Perform Lower Body Dressing: with min assist;sit to/from stand Pt Will Transfer to Toilet: with min assist;bedside commode;ambulating;with +2 assist Pt Will Perform Toileting - Clothing Manipulation and hygiene: sit to/from stand;with set-up;with min guard assist;with 2+ total assist  Plan Discharge plan remains appropriate;Frequency remains appropriate    Co-evaluation        PT goals addressed during session: Mobility/safety with mobility;Balance;Proper use of DME        AM-PAC OT "6 Clicks" Daily Activity     Outcome Measure   Help from another person eating meals?: A Little Help from another person taking care of personal grooming?: A Little Help from another person toileting, which includes using toliet, bedpan, or urinal?: A Lot Help from another person bathing (including washing, rinsing, drying)?: A Lot Help from another person to put on and taking off regular upper body clothing?: A Little Help from another person to put on and taking off regular lower body clothing?:  A Lot 6 Click Score: 15    End of Session    OT Visit Diagnosis: Other abnormalities of gait and mobility (R26.89);Muscle weakness (generalized) (M62.81);Other symptoms and signs involving cognitive function   Activity Tolerance Patient tolerated treatment well   Patient Left in bed;with call bell/phone within reach;with bed alarm set;with family/visitor present   Nurse Communication Mobility status        Time: 4401-0272 OT Time Calculation (min): 19 min  Charges: OT General Charges $OT Visit: 1 Visit OT Treatments $Self Care/Home Management : 8-22 mins  Dessie Coma, M.S. OTR/L  10/17/20, 3:51 PM  ascom [phone]

## 2020-10-17 NOTE — Progress Notes (Signed)
Physical Therapy Treatment Patient Details Name: Eddie Chapman MRN: [id number] DOB: 1960-12-26 Today's Date: 10/17/2020    History of Present Illness [name] admitted with SEVERE Acute hypoxemic respiratory failure due to CT chest with impressive Left lung pneumonia complicated by Acute metabolic encephalopathy related to critical illness,  Acute kidney injury due to ATN, associated with morbid obesity. Was extubated and transferred to Eddie Chapman.  On 10/11/19 he is unresponsive with snoring respirations and copious secretions requiring intubation for airway protection following Ativan administration. 60 yo M with hx of cerebral aneurysm, PVD, HFrEF, pulmonary htn, morbid obesity, OSA, GERD, hx of TBI, dyslipidemia who came in after girlfriend found him to be unresponsive.  He was found to be severely hypoxemic in circulatory shock with left lung infiltrate on CXR. Labwork consistent with sepsis. Extubated on 10/13/20. Currently on 3L with O2 saturation in the mid 90's.    PT Comments    Pt was long sitting in bed with girlfriend at bedside. He agrees to session and is extremely motivated. Needs several occasions to be interrupted and redirected to focus on desired task. Pt does have some slight impulsivity but overall is able to follow commands consistently throughout. He required assistance to exit R side of bed with bed height flat. vcs throughout session for improved technique and safety with all mobility,transfers, and gait. Pt was able to stand several times to RW from slightly elevated bed height. Ambulated 81ft 2 x. Is at high fall risk due to weakness in BLEs. Slight knee buckling at times but pt able to correct with cues + min assist. Pt is great CIR candidate. Would benefit from CIR to address deficits while assisting pt to PLOF. Was completely independent prior to admission.    Follow Up Recommendations  CIR;Other (comment) (Both pt/pt's spouse in agreement that he is not safe to return  home without CIR/SNF prior)     Equipment Recommendations  None recommended by PT    Recommendations for Other Services       Precautions / Restrictions Precautions Precautions: Fall Restrictions Weight Bearing Restrictions: No    Mobility  Bed Mobility Overal bed mobility: Needs Assistance Bed Mobility: Supine to Sit;Sit to Supine Rolling: Min assist;Mod assist   Supine to sit: Mod assist Sit to supine: Min assist   General bed mobility comments: flat bed  Transfers Overall transfer level: Needs assistance Equipment used: Rolling walker (2 wheeled) Transfers: Sit to/from Stand Sit to Stand: Min assist;From elevated surface         General transfer comment: poor safety awareness with vcs throughotu for improved technique and safety  Ambulation/Gait Ambulation/Gait assistance: Min assist Gait Distance (Feet): 50 Feet Assistive device: Rolling walker (2 wheeled) Gait Pattern/deviations: Step-through pattern Gait velocity: decreased   General Gait Details: Pt was able to ambulate 50 ft with RW however poor gait safety overall. pt has BLE weakness with occasional slight knee buckling during ambulation. He fatigued quickly but overall tiolerated ambulation on rm air with HR increasing to 126 at its max.       Balance Overall balance assessment: Needs assistance Sitting-balance support: Feet supported;Bilateral upper extremity supported Sitting balance-Leahy Scale: Good     Standing balance support: Bilateral upper extremity supported;During functional activity Standing balance-Leahy Scale: Poor Standing balance comment: pt at high fall risk 2/2 to BLE weakness and poor safety awareness       Cognition Arousal/Alertness: Awake/alert Behavior During Therapy: Impulsive Overall Cognitive Status: History of cognitive impairments - at baseline  General Comments: Patient is very verbose and tangential. Impaired safety awareness             Pertinent  Vitals/Pain Pain Assessment: No/denies pain           PT Goals (current goals can now be found in the care plan section) Acute Rehab PT Goals Patient Stated Goal: return to PLOF Progress towards PT goals: Progressing toward goals    Frequency    Min 2X/week      PT Plan Current plan remains appropriate    Co-evaluation     PT goals addressed during session: Mobility/safety with mobility;Balance;Proper use of DME        AM-PAC PT "6 Clicks" Mobility   Outcome Measure  Help needed turning from your back to your side while in a flat bed without using bedrails?: A Lot Help needed moving from lying on your back to sitting on the side of a flat bed without using bedrails?: A Lot Help needed moving to and from a bed to a chair (including a wheelchair)?: A Lot Help needed standing up from a chair using your arms (e.g., wheelchair or bedside chair)?: A Lot Help needed to walk in hospital room?: A Lot Help needed climbing 3-5 steps with a railing? : A Lot 6 Click Score: 12    End of Session Equipment Utilized During Treatment: Gait belt Activity Tolerance: Patient tolerated treatment well Patient left: in bed;with call bell/phone within reach;with bed alarm set Nurse Communication: Mobility status PT Visit Diagnosis: Muscle weakness (generalized) (M62.81);Difficulty in walking, not elsewhere classified (R26.2)     Time: 1950-9326 PT Time Calculation (min) (ACUTE ONLY): 30 min  Charges:  $Gait Training: 8-22 mins $Therapeutic Activity: 8-22 mins                     Eddie Chapman PTA 10/17/20, 3:08 PM

## 2020-10-18 LAB — CBC WITH DIFFERENTIAL/PLATELET
Abs Immature Granulocytes: 0.06 10*3/uL (ref 0.00–0.07)
Basophils Absolute: 0 10*3/uL (ref 0.0–0.1)
Basophils Relative: 0 %
Eosinophils Absolute: 0.1 10*3/uL (ref 0.0–0.5)
Eosinophils Relative: 1 %
HCT: 44.8 % (ref 39.0–52.0)
Hemoglobin: 14.5 g/dL (ref 13.0–17.0)
Immature Granulocytes: 1 %
Lymphocytes Relative: 16 %
Lymphs Abs: 1.4 10*3/uL (ref 0.7–4.0)
MCH: 29.7 pg (ref 26.0–34.0)
MCHC: 32.4 g/dL (ref 30.0–36.0)
MCV: 91.6 fL (ref 80.0–100.0)
Monocytes Absolute: 1.1 10*3/uL — ABNORMAL HIGH (ref 0.1–1.0)
Monocytes Relative: 13 %
Neutro Abs: 5.8 10*3/uL (ref 1.7–7.7)
Neutrophils Relative %: 69 %
Platelets: 250 10*3/uL (ref 150–400)
RBC: 4.89 MIL/uL (ref 4.22–5.81)
RDW: 13.4 % (ref 11.5–15.5)
WBC: 8.5 10*3/uL (ref 4.0–10.5)
nRBC: 0 % (ref 0.0–0.2)

## 2020-10-18 LAB — BASIC METABOLIC PANEL
Anion gap: 9 (ref 5–15)
BUN: 14 mg/dL (ref 8–23)
CO2: 30 mmol/L (ref 22–32)
Calcium: 9.3 mg/dL (ref 8.9–10.3)
Chloride: 100 mmol/L (ref 98–111)
Creatinine, Ser: 0.58 mg/dL — ABNORMAL LOW (ref 0.61–1.24)
GFR, Estimated: >60 mL/min (ref >60)
Glucose, Bld: 105 mg/dL — ABNORMAL HIGH (ref 70–99)
Potassium: 3.8 mmol/L (ref 3.5–5.1)
Sodium: 139 mmol/L (ref 135–145)

## 2020-10-18 LAB — GLUCOSE, CAPILLARY
Glucose-Capillary: 100 mg/dL — ABNORMAL HIGH (ref 70–99)
Glucose-Capillary: 105 mg/dL — ABNORMAL HIGH (ref 70–99)
Glucose-Capillary: 110 mg/dL — ABNORMAL HIGH (ref 70–99)
Glucose-Capillary: 114 mg/dL — ABNORMAL HIGH (ref 70–99)
Glucose-Capillary: 129 mg/dL — ABNORMAL HIGH (ref 70–99)
Glucose-Capillary: 93 mg/dL (ref 70–99)

## 2020-10-18 LAB — MAGNESIUM: Magnesium: 1.7 mg/dL (ref 1.7–2.4)

## 2020-10-18 MED ORDER — MAGNESIUM SULFATE 2 GM/50ML IV SOLN
2.0000 g | Freq: Once | INTRAVENOUS | Status: AC
  Administered 2020-10-18: 2 g via INTRAVENOUS
  Filled 2020-10-18: qty 50

## 2020-10-18 NOTE — Progress Notes (Signed)
PROGRESS NOTE    Eddie Chapman   XHB:[id number]  DOB: [birth year]  DOA: 09/21/2020     27  PCP: Adin Hector, MD  CC: found unresponsive at home   Hospital Course: [name] hx of cerebral aneurysm, PVD, HFrEF, pulmonary htn, morbid obesity, OSA, GERD, hx of TBI, dyslipidemia who was found unresponsive by his girlfriend at home.  Admitted withAcute hypoxemic respiratory failurerequiring intubation due to left lower lobe community-acquired pneumonia complicated byAcute metabolic encephalopathy related to critical illness, Acute kidney injury due to ATN.  Was extubated and transferred to [place] on 10/10/20, however he became unresponsivewith snoringrespirations and copioussecretionsrequiring reintubation for airway protectionafter Ativan administration.  He was extubated on 10/13/2020.  He was weaned down to room air on 10/17/2020.  Interval History:  Tentative plan is CIR if able.  No events overnight.  He was resting comfortably in bed this morning.  He is still hoping for going to rehab if and when able.  Old records reviewed in assessment of this patient  ROS: Constitutional: negative for chills and fevers, Respiratory: negative for cough, Cardiovascular: negative for chest pain and Gastrointestinal: negative for abdominal pain  Assessment & Plan: Resolved acute Hypoxic and Hypercapnic Respiratory Failure secondary to left lower lobe community-acquired pneumonia Patient was initially found unresponsive at home by his girlfriend. Admission chest x-ray revealed significant infiltrate involving left lower lobe - completed course of zosyn - followed by SLP; currently on dys 3 diet -Weaned to room air on 10/17/2020 -Continue incentive spirometer and flutter  Hypercarbia with concern for OHS/OSA Obtained ABG on 10/15/2020, showing mild hypercarbia PCO2 58 with corrected pH 7.4 Continue BiPAP at night -Needs outpatient sleep study if not previously done  Acute on  chronic diastolic CHF Continue current cardiac medications, he is on Entresto Continue to follow up cardiology recs Last 2D echo done on 09/22/2020 showed normal LVEF 50 to 55% Continue strict I's and O's and daily weight  Demand ischemia -Considered due to acute respiratory failure Troponin peaked at 56 and trended down  2D echo 09/22/20 preserved LVEF 50-55% Seen by cardiology - remains CP free  Possible aspiration pneumonia, POA - s/p Zosyn, completed 5 days course. Was seen by speech therapist - continue dys 3 diet per SLP Continue aspiration precautions  Morbid obesity.  BMI 41 Recommend weight loss outpatient with healthy dieting and regular physical activity.  Physical debility PT recommending CIR Fall precautions. Inpatient rehab consulted for possible admission   DVT prophylaxis: Lovenox Code Status: Full Family Communication: g/f bedside Disposition Plan: Status is: Inpatient  Remains inpatient appropriate because:Unsafe d/c plan and Inpatient level of care appropriate due to severity of illness   Dispo:  Patient From: Home  Planned Disposition: Inpatient Rehab if approved  Expected discharge date: 10/19/2020  Medically stable for discharge: Yes  Objective: Blood pressure 114/78, pulse 95, temperature 99 F (37.2 C), temperature source Oral, resp. rate 20, height '5\' 10"'  (1.778 m), weight 128.7 kg, SpO2 97 %.  Examination: General appearance: alert, cooperative and no distress Head: Normocephalic, without obvious abnormality, atraumatic Eyes: EOMI Lungs: Bibasilar crackles, no wheezing Heart: regular rate and rhythm and S1, S2 normal Abdomen: normal findings: bowel sounds normal and soft, non-tender Extremities: No edema Skin: mobility and turgor normal Neurologic: Grossly normal  Consultants:     Procedures:     Data Reviewed: I have personally reviewed following labs and imaging studies Results for orders placed or performed during the  hospital encounter of 09/21/20 (from  the past 24 hour(s))  Glucose, capillary     Status: Abnormal   Collection Time: 10/17/20  8:09 PM  Result Value Ref Range   Glucose-Capillary 174 (H) 70 - 99 mg/dL  Glucose, capillary     Status: Abnormal   Collection Time: 10/17/20 11:10 PM  Result Value Ref Range   Glucose-Capillary 110 (H) 70 - 99 mg/dL  Glucose, capillary     Status: None   Collection Time: 10/18/20  4:25 AM  Result Value Ref Range   Glucose-Capillary 93 70 - 99 mg/dL  Basic metabolic panel     Status: Abnormal   Collection Time: 10/18/20  6:45 AM  Result Value Ref Range   Sodium 139 135 - 145 mmol/L   Potassium 3.8 3.5 - 5.1 mmol/L   Chloride 100 98 - 111 mmol/L   CO2 30 22 - 32 mmol/L   Glucose, Bld 105 (H) 70 - 99 mg/dL   BUN 14 8 - 23 mg/dL   Creatinine, Ser 0.58 (L) 0.61 - 1.24 mg/dL   Calcium 9.3 8.9 - 10.3 mg/dL   GFR, Estimated >60 >60 mL/min   Anion gap 9 5 - 15  Magnesium     Status: None   Collection Time: 10/18/20  6:45 AM  Result Value Ref Range   Magnesium 1.7 1.7 - 2.4 mg/dL  CBC with Differential/Platelet     Status: Abnormal   Collection Time: 10/18/20  6:45 AM  Result Value Ref Range   WBC 8.5 4.0 - 10.5 K/uL   RBC 4.89 4.22 - 5.81 MIL/uL   Hemoglobin 14.5 13.0 - 17.0 g/dL   HCT 44.8 39.0 - 52.0 %   MCV 91.6 80.0 - 100.0 fL   MCH 29.7 26.0 - 34.0 pg   MCHC 32.4 30.0 - 36.0 g/dL   RDW 13.4 11.5 - 15.5 %   Platelets 250 150 - 400 K/uL   nRBC 0.0 0.0 - 0.2 %   Neutrophils Relative % 69 %   Neutro Abs 5.8 1.7 - 7.7 K/uL   Lymphocytes Relative 16 %   Lymphs Abs 1.4 0.7 - 4.0 K/uL   Monocytes Relative 13 %   Monocytes Absolute 1.1 (H) 0.1 - 1.0 K/uL   Eosinophils Relative 1 %   Eosinophils Absolute 0.1 0.0 - 0.5 K/uL   Basophils Relative 0 %   Basophils Absolute 0.0 0.0 - 0.1 K/uL   Immature Granulocytes 1 %   Abs Immature Granulocytes 0.06 0.00 - 0.07 K/uL  Glucose, capillary     Status: Abnormal   Collection Time: 10/18/20  7:43 AM   Result Value Ref Range   Glucose-Capillary 105 (H) 70 - 99 mg/dL  Glucose, capillary     Status: Abnormal   Collection Time: 10/18/20 11:34 AM  Result Value Ref Range   Glucose-Capillary 110 (H) 70 - 99 mg/dL   Comment 1 Notify RN     No results found for this or any previous visit (from the past 240 hour(s)).   Radiology Studies: No results found. [place] EKG SITE RITE  Final Result    DG Chest [facility] Div 1 View  Final Result    DG Abd 1 View  Final Result    DG Chest Port 1 View  Final Result    DG Chest Port 1 View  Final Result    DG Chest Port 1 View  Final Result    DG CHEST PORT 1 VIEW  Final Result    [place] EKG SITE RITE  Final  Result    CT HEAD WO CONTRAST  Final Result    US Venous Img Lower Bilateral (DVT)  Final Result    DG Chest Port 1 View  Final Result    CT CHEST WO CONTRAST  Final Result    CT HEAD WO CONTRAST  Final Result    DG Chest Port 1 View  Final Result    DG Chest Port 1 View  Final Result    DG Abd Portable 1V  Final Result    CT Head Wo Contrast  Final Result    DG Chest Portable 1 View  Final Result      Scheduled Meds: . chlorhexidine gluconate (MEDLINE KIT)  15 mL Mouth Rinse BID  . Chlorhexidine Gluconate Cloth  6 each Topical Daily  . enoxaparin (LOVENOX) injection  0.5 mg/kg Subcutaneous Q24H  . feeding supplement  237 mL Oral BID BM  . insulin aspart  0-15 Units Subcutaneous Q4H  . ipratropium-albuterol  3 mL Nebulization TID  . metoprolol succinate  100 mg Oral Daily  . multivitamin with minerals  1 tablet Oral Daily  . pantoprazole  40 mg Oral Daily  . sacubitril-valsartan  1 tablet Oral BID  . sodium chloride flush  10-40 mL Intracatheter Q12H   PRN Meds: acetaminophen, docusate, hydrALAZINE, ipratropium-albuterol, labetalol, metoprolol tartrate, polyethylene glycol, sodium chloride, sodium chloride flush Continuous Infusions: . sodium chloride Stopped (10/16/20 1321)  . sodium chloride       LOS: 27  days  Time spent: Greater than 50% of the 35 minute visit was spent in counseling/coordination of care for the patient as laid out in the A&P.   Dwyane Dee, MD [facility] 10/18/2020, 5:14 PM

## 2020-10-18 NOTE — Progress Notes (Signed)
Physical Therapy Treatment Patient Details Name: Eddie Chapman MRN: [id number] DOB: [birth year] Today's Date: 10/18/2020    History of Present Illness [name] admitted with SEVERE Acute hypoxemic respiratory failure due to CT chest with impressive Left lung pneumonia complicated by Acute metabolic encephalopathy related to critical illness,  Acute kidney injury due to ATN, associated with morbid obesity. Was extubated and transferred to [place].  On 10/11/19 he is unresponsive with snoring respirations and copious secretions requiring intubation for airway protection following Ativan administration. 60 yo M with hx of cerebral aneurysm, PVD, HFrEF, pulmonary htn, morbid obesity, OSA, GERD, hx of TBI, dyslipidemia who came in after girlfriend found him to be unresponsive.  He was found to be severely hypoxemic in circulatory shock with left lung infiltrate on CXR. Labwork consistent with sepsis. Extubated on 10/13/20. Currently on 3L with O2 saturation in the mid 90's.    PT Comments    Pt received in bed, girlfriend at bedside. Pt very anxious requiring vc's to redirect and calm down due to pt being very anxious to get to CIR as soon as possible. This therapist explained process to pt with better understanding. Pt very willing to participate in skilled PT session.  Pt transferred supine to sit with Min/ModA HOB raised and use of side rail. Sitting EOB x 6 minutes to adjust to positional change and educate pt on next functional activity and technique. Pt stood from raised bed with MinA to RW. Gait training in room with CG/MinA for safety and increased reliance on RW for support.  Occasional slight buckling at knees, self corrected by pt and support.  Pt tolerated 2ft x 2 with 5 minute seated rest break in between. Min/ModA to return to supine with use of side rail.  Reviewed B LE AROM exercises with pt, with good recall.  Pt appears to be a great candidate for acute rehab placement prior to returning  home with support.    Follow Up Recommendations  CIR;Other (comment)     Equipment Recommendations  None recommended by PT    Recommendations for Other Services       Precautions / Restrictions Precautions Precautions: Fall Restrictions Weight Bearing Restrictions: No    Mobility  Bed Mobility Overal bed mobility: Needs Assistance Bed Mobility: Supine to Sit;Sit to Supine Rolling: Min assist;Mod assist   Supine to sit: Min assist Sit to supine: Min assist   General bed mobility comments: uses high amplitude rocking movements to achieve sitting  Transfers Overall transfer level: Needs assistance Equipment used: Rolling walker (2 wheeled) Transfers: Sit to/from Stand Sit to Stand: Min assist;From elevated surface         General transfer comment:  (Pt very impulsive requiring repeated vc's to maintain focus.)  Ambulation/Gait Ambulation/Gait assistance: Min assist Gait Distance (Feet): 80 Feet Assistive device: Rolling walker (2 wheeled) Gait Pattern/deviations: Step-through pattern Gait velocity: decreased   General Gait Details: Pt tolerated 33ft x 2 with seated rest break due to fatigue and B knee weekness.   Stairs             Wheelchair Mobility    Modified Rankin (Stroke Patients Only)       Balance Overall balance assessment: Needs assistance         Standing balance support: Bilateral upper extremity supported;During functional activity Standing balance-Leahy Scale: Poor Standing balance comment: pt at high fall risk 2/2 to BLE weakness and poor safety awareness  Cognition Arousal/Alertness: Awake/alert Behavior During Therapy: Impulsive Overall Cognitive Status: History of cognitive impairments - at baseline                                 General Comments: Patient is very verbose and tangential. Impaired safety awareness      Exercises      General Comments General  comments (skin integrity, edema, etc.): Pt required increased time to complete tasks due to low attention span and repeated vc's to stay on task.      Pertinent Vitals/Pain Pain Assessment: No/denies pain    Home Living                      Prior Function            PT Goals (current goals can now be found in the care plan section) Acute Rehab PT Goals Patient Stated Goal: return to PLOF Progress towards PT goals: Progressing toward goals    Frequency    Min 2X/week      PT Plan Current plan remains appropriate    Co-evaluation              AM-PAC PT "6 Clicks" Mobility   Outcome Measure  Help needed turning from your back to your side while in a flat bed without using bedrails?: A Lot Help needed moving from lying on your back to sitting on the side of a flat bed without using bedrails?: A Lot Help needed moving to and from a bed to a chair (including a wheelchair)?: A Lot Help needed standing up from a chair using your arms (e.g., wheelchair or bedside chair)?: A Lot Help needed to walk in hospital room?: A Lot Help needed climbing 3-5 steps with a railing? : A Lot 6 Click Score: 12    End of Session Equipment Utilized During Treatment: Gait belt Activity Tolerance: Patient tolerated treatment well Patient left: in bed;with call bell/phone within reach;with bed alarm set;with family/visitor present;with SCD's reapplied Nurse Communication: Mobility status PT Visit Diagnosis: Muscle weakness (generalized) (M62.81);Difficulty in walking, not elsewhere classified (R26.2)     Time: 1330-1430 PT Time Calculation (min) (ACUTE ONLY): 60 min  Charges:  $Gait Training: 23-37 mins $Therapeutic Exercise: 23-37 mins                     Mikel Cella, PTA   Josie Dixon 10/18/2020, 5:00 PM

## 2020-10-18 NOTE — Progress Notes (Signed)
[facility] for Electrolyte Monitoring and Replacement   Recent Labs: Potassium (mmol/L)  Date Value  10/18/2020 3.8  05/04/2014 4.3   Magnesium (mg/dL)  Date Value  10/18/2020 1.7   Calcium (mg/dL)  Date Value  10/18/2020 9.3   Calcium, Total (mg/dL)  Date Value  05/04/2014 9.1   Albumin (g/dL)  Date Value  10/12/2020 2.7 (L)   Phosphorus (mg/dL)  Date Value  10/17/2020 3.6   Sodium (mmol/L)  Date Value  10/18/2020 139  05/04/2014 140    Assessment: 60 year old male presented with AMS due to septic shock d/t PNA. PMH includes OSA and CHF.  Pharmacy to manage electrolytes.  Nutrition: dysphagia 3 diet + Ensure BID  Goal of Therapy:  Electrolytes WNL  Plan:   Magnesium is borderline low and trending down:  2 grams IV magnesium sulfate x 1  Follow up electrolytes with morning labs.  [name], PharmD Clinical Pharmacist  10/18/2020 7:30 AM

## 2020-10-18 NOTE — Progress Notes (Signed)
Inpatient Rehab Admissions Coordinator:   Spoke to pt's significant other, [name], who confirms they would like to try and get insurance authorization for CIR. Will start that request today and f/u once I hear a determination.    Shann Medal, PT, DPT Admissions Coordinator [phone] 10/18/20  1:31 PM

## 2020-10-19 LAB — GLUCOSE, CAPILLARY
Glucose-Capillary: 104 mg/dL — ABNORMAL HIGH (ref 70–99)
Glucose-Capillary: 110 mg/dL — ABNORMAL HIGH (ref 70–99)
Glucose-Capillary: 143 mg/dL — ABNORMAL HIGH (ref 70–99)
Glucose-Capillary: 81 mg/dL (ref 70–99)
Glucose-Capillary: 91 mg/dL (ref 70–99)
Glucose-Capillary: 97 mg/dL (ref 70–99)

## 2020-10-19 NOTE — Progress Notes (Signed)
Mobility Specialist - Progress Note   10/19/20 1605  Mobility  Activity Ambulated in room  Level of Assistance Standby assist, set-up cues, supervision of patient - no hands on  Assistive Device Front wheel walker  Distance Ambulated (ft) 40 ft  Mobility Response Tolerated well  Mobility performed by Mobility specialist  $Mobility charge 1 Mobility    Pre-mobility: 96 HR, 92% SpO2 During mobility: 111 HR, 91% SpO2 Post-mobility: 102 HR, 95% SpO2   Pt laying in bed w/ wife present in room upon arrival. Pt very agreeable to session. Pt SBA t/o session. Pt ambulated 40' total in room utilizing RW. CGA for safety. No LOB noted. No c/o pain or SOB. Overall, pt tolerated session well. Pt left laying in bed w/ alarm set. All needs placed in reach.     [facility] [place] Mobility Specialist  10/19/20, 4:12 PM

## 2020-10-19 NOTE — Progress Notes (Signed)
Physical Therapy Treatment Patient Details Name: Eddie Chapman MRN: [id number] DOB: 1961/07/03 Today's Date: 10/19/2020    History of Present Illness [name] admitted with SEVERE Acute hypoxemic respiratory failure due to CT chest with impressive Left lung pneumonia complicated by Acute metabolic encephalopathy related to critical illness,  Acute kidney injury due to ATN, associated with morbid obesity. Was extubated and transferred to [place].  On 10/11/19 he is unresponsive with snoring respirations and copious secretions requiring intubation for airway protection following Ativan administration. 60 yo M with hx of cerebral aneurysm, PVD, HFrEF, pulmonary htn, morbid obesity, OSA, GERD, hx of TBI, dyslipidemia who came in after girlfriend found him to be unresponsive.  He was found to be severely hypoxemic in circulatory shock with left lung infiltrate on CXR. Labwork consistent with sepsis. Extubated on 10/13/20. Currently on 3L with O2 saturation in the mid 90's.    PT Comments    Pt was long sitting in bed upon arriving. Her is awake and oriented and states he has been up for several hours. He is extremely eager to participate. Pt ask a lot of questions about going to in patioent rehab. Pt is awaiting insurance auth for rehab and bed available. He continues to present as great candidate for inpatient rehab. Need assistance for all safe functional mobility. Very motivated to return to PLOF and get home "moving again."Acute PT will continue to follow and progress per current POC.     Follow Up Recommendations  CIR;Supervision/Assistance - 24 hour;Supervision for mobility/OOB     Equipment Recommendations  Other (comment) (defer to next level of care)    Recommendations for Other Services       Precautions / Restrictions Precautions Precautions: Fall Restrictions Weight Bearing Restrictions: No    Mobility  Bed Mobility Overal bed mobility: Needs Assistance Bed Mobility: Supine  to Sit;Sit to Supine Rolling: Min assist;Mod assist   Supine to sit: Min assist Sit to supine: Min assist   General bed mobility comments: heavy use of bed rails/ momentum  Transfers Overall transfer level: Needs assistance Equipment used: Rolling walker (2 wheeled) Transfers: Sit to/from Stand Sit to Stand: Min assist;From elevated surface         General transfer comment: Min assist to stand from elevated bed height. pt has weakness in BLEs. buckling noted in all dynamic standing activity  Ambulation/Gait Ambulation/Gait assistance: Min assist Gait Distance (Feet): 40 Feet Assistive device: Rolling walker (2 wheeled) Gait Pattern/deviations: Step-through pattern Gait velocity: decreased   General Gait Details: pt ambulate 40 ft with RW + min assist. Constant vcs for improved safety and focus on preventing knee buckling. Pt slight SOB and overall fatigued very quickly.       Balance Overall balance assessment: Needs assistance Sitting-balance support: Feet supported;Bilateral upper extremity supported Sitting balance-Leahy Scale: Good     Standing balance support: Bilateral upper extremity supported;During functional activity Standing balance-Leahy Scale: Poor Standing balance comment: pt at high fall risk 2/2 to BLE weakness and poor safety awareness       Cognition Arousal/Alertness: Awake/alert Behavior During Therapy: Anxious Overall Cognitive Status: History of cognitive impairments - at baseline    General Comments: Patient is very verbose and tangential. Impaired safety awareness         General Comments General comments (skin integrity, edema, etc.): Issued alot of sitting/supine exercises and pt performed to improved strength and coordination.      Pertinent Vitals/Pain Pain Assessment: No/denies pain  PT Goals (current goals can now be found in the care plan section) Acute Rehab PT Goals Patient Stated Goal: return to PLOF Progress  towards PT goals: Progressing toward goals    Frequency    Min 2X/week      PT Plan Current plan remains appropriate    Co-evaluation     PT goals addressed during session: Strengthening/ROM;Proper use of DME;Balance;Mobility/safety with mobility        AM-PAC PT "6 Clicks" Mobility   Outcome Measure  Help needed turning from your back to your side while in a flat bed without using bedrails?: A Little Help needed moving from lying on your back to sitting on the side of a flat bed without using bedrails?: A Little Help needed moving to and from a bed to a chair (including a wheelchair)?: A Little Help needed standing up from a chair using your arms (e.g., wheelchair or bedside chair)?: A Little Help needed to walk in hospital room?: A Little Help needed climbing 3-5 steps with a railing? : A Lot 6 Click Score: 17    End of Session Equipment Utilized During Treatment: Gait belt Activity Tolerance: Patient tolerated treatment well Patient left: in bed;with call bell/phone within reach;with bed alarm set;with family/visitor present;with SCD's reapplied Nurse Communication: Mobility status PT Visit Diagnosis: Muscle weakness (generalized) (M62.81);Difficulty in walking, not elsewhere classified (R26.2)     Time: 6606-3016 PT Time Calculation (min) (ACUTE ONLY): 31 min  Charges:  $Gait Training: 8-22 mins $Therapeutic Exercise: 8-22 mins                     Julaine Fusi PTA 10/19/20, 9:47 AM

## 2020-10-19 NOTE — TOC Progression Note (Signed)
Transition of Care [facility]) - Progression Note    Patient Details  Name: Eddie Chapman MRN: [id number] Date of Birth: October 10, 1960  Transition of [facility] CM/SW Contact  Beverly Sessions, RN Phone Number: 10/19/2020, 2:53 PM  Clinical Narrative:    Insurance has denied patient for CIR Patient would like to purse SNF  Bed search initiated Fl2 sent for signature  PASRR obtained         Expected Discharge Plan and Services                                                 Social Determinants of Health (SDOH) Interventions    Readmission Risk Interventions No flowsheet data found.

## 2020-10-19 NOTE — Progress Notes (Signed)
PT  Note  Patient Details Name: Eddie Chapman MRN: [id number] DOB: 1961-01-19   Do to CIR insurance denial. Acute PT recommends DC to SNF to address deficits. Will benefit from continued skilled PT at DC to address deficits while improving safety with ADL.   Willette Pa 10/19/2020, 11:27 AM

## 2020-10-19 NOTE — Progress Notes (Signed)
PROGRESS NOTE    Eddie Chapman   UEA:[id number]  DOB: 06-25-1961  DOA: 09/21/2020     27  PCP: Adin Hector, MD  CC: found unresponsive at home   Hospital Course: [name] hx of cerebral aneurysm, PVD, HFrEF, pulmonary htn, morbid obesity, OSA, GERD, hx of TBI, dyslipidemia who was found unresponsive by his girlfriend at home.  Admitted withAcute hypoxemic respiratory failurerequiring intubation due to left lower lobe community-acquired pneumonia complicated byAcute metabolic encephalopathy related to critical illness, Acute kidney injury due to ATN.  Was extubated and transferred to [address] on 10/10/20, however he became unresponsivewith snoringrespirations and copioussecretionsrequiring reintubation for airway protectionafter Ativan administration.  He was extubated on 10/13/2020.  He was weaned down to room air on 10/17/2020.  Interval History:  Insurance denied CIR approval today. Patient in need of SNF still. Further plans to be determined.  He was otherwise resting comfortably in bed. No SOB. Remains on RA.   Old records reviewed in assessment of this patient  ROS: Constitutional: negative for chills and fevers, Respiratory: negative for cough, Cardiovascular: negative for chest pain and Gastrointestinal: negative for abdominal pain  Assessment & Plan: Resolved acute Hypoxic and Hypercapnic Respiratory Failure secondary to left lower lobe community-acquired pneumonia Patient was initially found unresponsive at home by his girlfriend. Admission chest x-ray revealed significant infiltrate involving left lower lobe - completed course of zosyn - followed by SLP; currently on dys 3 diet -Weaned to room air on 10/17/2020 -Continue incentive spirometer and flutter  Hypercarbia with concern for OHS/OSA Obtained ABG on 10/15/2020, showing mild hypercarbia PCO2 58 with corrected pH 7.4 Continue BiPAP at night -Needs outpatient sleep study if not previously  done  Acute on chronic diastolic CHF Continue current cardiac medications, he is on Entresto Continue to follow up cardiology recs Last 2D echo done on 09/22/2020 showed normal LVEF 50 to 55% Continue strict I's and O's and daily weight  Demand ischemia -Considered due to acute respiratory failure Troponin peaked at 56 and trended down  2D echo 09/22/20 preserved LVEF 50-55% Seen by cardiology - remains CP free  Possible aspiration pneumonia, POA - s/p Zosyn, completed 5 days course. Was seen by speech therapist - continue dys 3 diet per SLP Continue aspiration precautions  Morbid obesity.  BMI 41 Recommend weight loss outpatient with healthy dieting and regular physical activity.  Physical debility PT recommending CIR. Insurance denied Fall precautions. Follow up plans for dispo   DVT prophylaxis: Lovenox Code Status: Full Family Communication: none present Disposition Plan: Status is: Inpatient  Remains inpatient appropriate because:Unsafe d/c plan and Inpatient level of care appropriate due to severity of illness   Dispo:  Patient From: Home  Planned Disposition: Ins denied CIR; awaiting alternative plans now; needs SNF  Expected discharge date: TBD  Medically stable for discharge: Yes  Objective: Blood pressure (!) 135/98, pulse 94, temperature 98.3 F (36.8 C), temperature source Oral, resp. rate 16, height '5\' 10"'  (1.778 m), weight 129.7 kg, SpO2 94 %.  Examination: General appearance: alert, cooperative and no distress Head: Normocephalic, without obvious abnormality, atraumatic Eyes: EOMI Lungs: Bibasilar crackles, no wheezing Heart: regular rate and rhythm and S1, S2 normal Abdomen: normal findings: bowel sounds normal and soft, non-tender Extremities: No edema Skin: mobility and turgor normal Neurologic: Grossly normal  Consultants:     Procedures:     Data Reviewed: I have personally reviewed following labs and imaging  studies Results for orders placed or performed during the  hospital encounter of 09/21/20 (from the past 24 hour(s))  Glucose, capillary     Status: Abnormal   Collection Time: 10/18/20  5:14 PM  Result Value Ref Range   Glucose-Capillary 114 (H) 70 - 99 mg/dL  Glucose, capillary     Status: Abnormal   Collection Time: 10/18/20  7:50 PM  Result Value Ref Range   Glucose-Capillary 129 (H) 70 - 99 mg/dL  Glucose, capillary     Status: Abnormal   Collection Time: 10/18/20 11:48 PM  Result Value Ref Range   Glucose-Capillary 100 (H) 70 - 99 mg/dL  Glucose, capillary     Status: None   Collection Time: 10/19/20  3:48 AM  Result Value Ref Range   Glucose-Capillary 97 70 - 99 mg/dL  Glucose, capillary     Status: None   Collection Time: 10/19/20  7:38 AM  Result Value Ref Range   Glucose-Capillary 91 70 - 99 mg/dL    No results found for this or any previous visit (from the past 240 hour(s)).   Radiology Studies: No results found. [place] EKG SITE RITE  Final Result    DG Chest Port 1 View  Final Result    DG Abd 1 View  Final Result    DG Chest Port 1 View  Final Result    DG Chest Port 1 View  Final Result    DG Chest Port 1 View  Final Result    DG CHEST PORT 1 VIEW  Final Result    [place] EKG SITE RITE  Final Result    CT HEAD WO CONTRAST  Final Result    US Venous Img Lower Bilateral (DVT)  Final Result    DG Chest Port 1 View  Final Result    CT CHEST WO CONTRAST  Final Result    CT HEAD WO CONTRAST  Final Result    DG Chest Port 1 View  Final Result    DG Chest Port 1 View  Final Result    DG Abd Portable 1V  Final Result    CT Head Wo Contrast  Final Result    DG Chest Portable 1 View  Final Result      Scheduled Meds:  chlorhexidine gluconate (MEDLINE KIT)  15 mL Mouth Rinse BID   Chlorhexidine Gluconate Cloth  6 each Topical Daily   enoxaparin (LOVENOX) injection  0.5 mg/kg Subcutaneous Q24H   feeding supplement  237 mL Oral BID BM    insulin aspart  0-15 Units Subcutaneous Q4H   ipratropium-albuterol  3 mL Nebulization TID   metoprolol succinate  100 mg Oral Daily   multivitamin with minerals  1 tablet Oral Daily   pantoprazole  40 mg Oral Daily   sacubitril-valsartan  1 tablet Oral BID   sodium chloride flush  10-40 mL Intracatheter Q12H   PRN Meds: acetaminophen, docusate, hydrALAZINE, ipratropium-albuterol, labetalol, metoprolol tartrate, polyethylene glycol, sodium chloride, sodium chloride flush Continuous Infusions:  sodium chloride Stopped (10/16/20 1321)   sodium chloride       LOS: 28 days  Time spent: Greater than 50% of the 35 minute visit was spent in counseling/coordination of care for the patient as laid out in the A&P.   Dwyane Dee, MD [facility] 10/19/2020, 11:50 AM

## 2020-10-19 NOTE — NC FL2 (Signed)
[place] LEVEL OF CARE SCREENING TOOL     IDENTIFICATION  Patient Name: Eddie Chapman Birthdate: [birth year] Sex: male Admission Date (Current Location): 09/21/2020  [place] and [place] Number:  Engineering geologist and Address:  [facility], [address] [place], [place] [address]      Provider Number: [id number]  Attending Physician Name and Address:  Dwyane Dee, MD  Relative Name and Phone Number:       Current Level of Care: Hospital Recommended Level of Care: [place] Prior Approval Number:    Date Approved/Denied:   PASRR Number: [id number]  Discharge Plan: SNF    Current Diagnoses: Patient Active Problem List   Diagnosis Date Noted  . Acute respiratory failure with hypoxia and hypercapnia ([place]) 10/07/2020  . Pneumonia involving left lung 10/07/2020  . OSA (obstructive sleep apnea) 10/07/2020  . Obesity hypoventilation syndrome ([place]) 10/07/2020  . Acute metabolic encephalopathy 63/09/6008  . Acute diastolic CHF (congestive heart failure) ([place]) 10/07/2020  . AKI (acute kidney injury) ([place]) 10/07/2020  . Morbid obesity with BMI of 40.0-44.9, adult ([place]) 10/07/2020  . Septic shock ([place]) 09/21/2020  . Occlusion and stenosis of vertebral artery 08/19/2016  . Aneurysm, cerebral, nonruptured 08/19/2016  . Essential hypertension 08/19/2016  . Hyperlipidemia 08/19/2016    Orientation RESPIRATION BLADDER Height & Weight     Self,Time,Situation,Place  Normal,Other (Comment) (CPAP at night.) Incontinent Weight: 285 lb 15 oz (129.7 kg) Height:  _0  (177.8 cm)  BEHAVIORAL SYMPTOMS/MOOD NEUROLOGICAL BOWEL NUTRITION STATUS   (None)  (None) Continent Diet (DYS 3. NO STRAWS!!!  Extra Gravy on meats, potatoes.)  AMBULATORY STATUS COMMUNICATION OF NEEDS Skin   Limited Assist Verbally Other (Comment) (Contact dermatitis, rash.)                       Personal Care Assistance Level of Assistance   Bathing,Feeding,Dressing Bathing Assistance: Maximum assistance Feeding assistance: Limited assistance Dressing Assistance: Maximum assistance     Functional Limitations Info  Sight,Hearing,Speech Sight Info: Adequate Hearing Info: Adequate Speech Info: Adequate    SPECIAL CARE FACTORS FREQUENCY  PT (By licensed PT),OT (By licensed OT)     PT Frequency: 5 x week OT Frequency: 5 x week            Contractures Contractures Info: Not present    Additional Factors Info  Code Status,Allergies Code Status Info: Full code Allergies Info: Divalproex Sodium, Valproic Acid           Current Medications (10/19/2020):  This is the current hospital active medication list Current Facility-Administered Medications  Medication Dose Route Frequency Provider Last Rate Last Admin  . 0.9 %  sodium chloride infusion   Intravenous Continuous Athena Masse, MD   Stopped at 10/16/20 1321  . acetaminophen (TYLENOL) tablet 650 mg  650 mg Oral Q6H PRN Flora Lipps, MD      . chlorhexidine gluconate (MEDLINE KIT) (PERIDEX) 0.12 % solution 15 mL  15 mL Mouth Rinse BID Flora Lipps, MD   15 mL at 10/19/20 0807  . Chlorhexidine Gluconate Cloth 2 % PADS 6 each  6 each Topical Daily Kayleen Memos, DO   6 each at 10/18/20 1532  . docusate (COLACE) 50 MG/5ML liquid 200 mg  200 mg Oral Daily PRN Irene Pap N, DO      . enoxaparin (LOVENOX) injection 65 mg  0.5 mg/kg Subcutaneous Q24H Hall, Carole N, DO   65 mg at 10/18/20  2121  . feeding supplement (ENSURE ENLIVE / ENSURE PLUS) liquid 237 mL  237 mL Oral BID BM Dwyane Dee, MD   237 mL at 10/19/20 1321  . hydrALAZINE (APRESOLINE) injection 10-20 mg  10-20 mg Intravenous Q4H PRN Flora Lipps, MD   10 mg at 10/13/20 1350  . insulin aspart (novoLOG) injection 0-15 Units  0-15 Units Subcutaneous Q4H Flora Lipps, MD   2 Units at 10/18/20 2122  . ipratropium-albuterol (DUONEB) 0.5-2.5 (3) MG/3ML nebulizer solution 3 mL  3 mL Nebulization Q4H PRN Darel Hong D, NP      . ipratropium-albuterol (DUONEB) 0.5-2.5 (3) MG/3ML nebulizer solution 3 mL  3 mL Nebulization TID Ottie Glazier, MD   3 mL at 10/19/20 1315  . labetalol (NORMODYNE) injection 10 mg  10 mg Intravenous Q2H PRN Tyna Jaksch, MD   10 mg at 10/04/20 0916  . metoprolol succinate (TOPROL-XL) 24 hr tablet 100 mg  100 mg Oral Daily Ottie Glazier, MD   100 mg at 10/19/20 0806  . metoprolol tartrate (LOPRESSOR) injection 2.5 mg  2.5 mg Intravenous Q3H PRN Ottie Glazier, MD   2.5 mg at 10/13/20 1037  . multivitamin with minerals tablet 1 tablet  1 tablet Oral Daily Flora Lipps, MD   1 tablet at 10/19/20 0806  . pantoprazole (PROTONIX) EC tablet 40 mg  40 mg Oral Daily [name], RPH   40 mg at 10/19/20 1916  . polyethylene glycol (MIRALAX / GLYCOLAX) packet 17 g  17 g Oral Daily PRN Irene Pap N, DO      . sacubitril-valsartan (ENTRESTO) 24-26 mg per tablet  1 tablet Oral BID Ottie Glazier, MD   1 tablet at 10/19/20 0807  . sodium chloride 0.9 % bolus 250 mL  250 mL Intravenous Once PRN Judd Gaudier V, MD      . sodium chloride flush (NS) 0.9 % injection 10-40 mL  10-40 mL Intracatheter PRN Flora Lipps, MD      . sodium chloride flush (NS) 0.9 % injection 10-40 mL  10-40 mL Intracatheter Q12H Flora Lipps, MD   10 mL at 10/19/20 0808     Discharge Medications: Please see discharge summary for a list of discharge medications.  Relevant Imaging Results:  Relevant Lab Results:   Additional Information SS#: [id number]  Candie Chroman, LCSW

## 2020-10-19 NOTE — Progress Notes (Signed)
[place] for Electrolyte Monitoring and Replacement   Recent Labs: Potassium (mmol/L)  Date Value  10/18/2020 3.8  05/04/2014 4.3   Magnesium (mg/dL)  Date Value  10/18/2020 1.7   Calcium (mg/dL)  Date Value  10/18/2020 9.3   Calcium, Total (mg/dL)  Date Value  05/04/2014 9.1   Albumin (g/dL)  Date Value  10/12/2020 2.7 (L)   Phosphorus (mg/dL)  Date Value  10/17/2020 3.6   Sodium (mmol/L)  Date Value  10/18/2020 139  05/04/2014 140    Assessment: 60 year old male presented with AMS due to septic shock d/t PNA. PMH includes OSA and CHF.  Pharmacy to manage electrolytes.  Nutrition: dysphagia 3 diet + Ensure BID  Goal of Therapy:  Electrolytes WNL  Plan:   No new labs. Will recheck with AM labs.   Oswald Hillock, PharmD Clinical Pharmacist  10/19/2020 8:13 AM

## 2020-10-19 NOTE — Progress Notes (Signed)
Nutrition Follow-up  DOCUMENTATION CODES:   Morbid obesity  INTERVENTION:  Continue Ensure Enlive po BID, each supplement provides 350 kcal and 20 grams of protein. Patient prefers chocolate and then strawberry if chocolate is not available.  NUTRITION DIAGNOSIS:   Increased nutrient needs related to catabolic illness (heart failure) as evidenced by estimated needs.  Ongoing.  GOAL:   Patient will meet greater than or equal to 90% of their needs  Met.  MONITOR:   PO intake,Supplement acceptance,Diet advancement,Labs,Weight trends,I & O's  REASON FOR ASSESSMENT:   Ventilator,Consult Enteral/tube feeding initiation and management  ASSESSMENT:   60 year old male with PMHx of cerebral aneurysm, TBI, HLD, HTN, depression, GERD, HFrEF, OSA admitted with severe hypoxemia, circulatory shock, left lung consolidated PNA.  12/23 intubated 1/4extubated and diet advanced to dysphagia 1 with nectar-thick liquids 1/7 diet advanced to dysphagia 1 with thin liquids 1/8 transferred out of ICU to PCU 1/11 transferred back to ICU and reintubated 1/13 extubated 1/14 diet advanced to dysphagia 1 with thin liquids 1/15 diet advanced to dysphagia 3 with thin liquids  Met with patient at bedside this morning. He reports his appetite is good and he is eating well at meals. He is eating 90-100% of meals. He enjoys the Ensure and is drinking approximately 2 per day. He prefers chocolate flavor but likes strawberry second best if there is no chocolate available. In the past 24 hours patient has had approximately 2558 kcal (95% minimum estimated needs) and 126 grams of protein (93% minimum estimated protein needs).   Medications reviewed and include: Novolog 0-15 units Q4hrs, MVI daily, Protonix.  Labs reviewed: CBG 91-114.  I/O: 500 mL UOP yesterday + 1 occurrence unmeasured UOP  Weight trend: 129.7 kg on 1/20; -15.4 kg from 12/23; pt has been diuresed this admission  Diet Order:   Diet  Order            DIET DYS 3 Room service appropriate? Yes with Assist; Fluid consistency: Thin  Diet effective now                EDUCATION NEEDS:   No education needs have been identified at this time  Skin:  Skin Assessment: Reviewed RN Assessment  Last BM:  10/18/2020 - type 6  Height:   Ht Readings from Last 1 Encounters:  10/14/20 _0  (1.778 m)   Weight:   Wt Readings from Last 1 Encounters:  10/19/20 129.7 kg   Ideal Body Weight:  75.5 kg  BMI:  Body mass index is 41.03 kg/m.  Estimated Nutritional Needs:   Kcal:  2700-3000  Protein:  >135 grams  Fluid:  2-2.3 L/day  Jacklynn Barnacle, MS, RD, LDN Pager number available on Amion

## 2020-10-19 NOTE — Progress Notes (Signed)
Inpatient Rehab Admissions Coordinator:   Notified by insurance that request for auth for CIR has been denied.  I let pt's family and TOC team know.  Will sign off for CIR at this time.   Shann Medal, PT, DPT Admissions Coordinator [phone] 10/19/20  11:19 AM

## 2020-10-20 DIAGNOSIS — I5022 Chronic systolic (congestive) heart failure: Secondary | ICD-10-CM | POA: Diagnosis not present

## 2020-10-20 DIAGNOSIS — R0602 Shortness of breath: Secondary | ICD-10-CM | POA: Diagnosis not present

## 2020-10-20 DIAGNOSIS — E662 Morbid (severe) obesity with alveolar hypoventilation: Secondary | ICD-10-CM | POA: Diagnosis not present

## 2020-10-20 DIAGNOSIS — I5033 Acute on chronic diastolic (congestive) heart failure: Secondary | ICD-10-CM | POA: Diagnosis not present

## 2020-10-20 DIAGNOSIS — I739 Peripheral vascular disease, unspecified: Secondary | ICD-10-CM | POA: Diagnosis not present

## 2020-10-20 DIAGNOSIS — R0600 Dyspnea, unspecified: Secondary | ICD-10-CM | POA: Diagnosis not present

## 2020-10-20 DIAGNOSIS — M6281 Muscle weakness (generalized): Secondary | ICD-10-CM | POA: Diagnosis not present

## 2020-10-20 DIAGNOSIS — J189 Pneumonia, unspecified organism: Secondary | ICD-10-CM | POA: Diagnosis not present

## 2020-10-20 DIAGNOSIS — I272 Pulmonary hypertension, unspecified: Secondary | ICD-10-CM | POA: Diagnosis not present

## 2020-10-20 DIAGNOSIS — K219 Gastro-esophageal reflux disease without esophagitis: Secondary | ICD-10-CM | POA: Diagnosis not present

## 2020-10-20 DIAGNOSIS — J9601 Acute respiratory failure with hypoxia: Secondary | ICD-10-CM | POA: Diagnosis not present

## 2020-10-20 DIAGNOSIS — J9602 Acute respiratory failure with hypercapnia: Secondary | ICD-10-CM | POA: Diagnosis not present

## 2020-10-20 DIAGNOSIS — I5032 Chronic diastolic (congestive) heart failure: Secondary | ICD-10-CM | POA: Diagnosis not present

## 2020-10-20 DIAGNOSIS — I10 Essential (primary) hypertension: Secondary | ICD-10-CM | POA: Diagnosis not present

## 2020-10-20 DIAGNOSIS — R0989 Other specified symptoms and signs involving the circulatory and respiratory systems: Secondary | ICD-10-CM | POA: Diagnosis not present

## 2020-10-20 DIAGNOSIS — I5031 Acute diastolic (congestive) heart failure: Secondary | ICD-10-CM | POA: Diagnosis not present

## 2020-10-20 DIAGNOSIS — G894 Chronic pain syndrome: Secondary | ICD-10-CM | POA: Diagnosis not present

## 2020-10-20 DIAGNOSIS — Z87820 Personal history of traumatic brain injury: Secondary | ICD-10-CM | POA: Diagnosis not present

## 2020-10-20 DIAGNOSIS — E785 Hyperlipidemia, unspecified: Secondary | ICD-10-CM | POA: Diagnosis not present

## 2020-10-20 DIAGNOSIS — R1312 Dysphagia, oropharyngeal phase: Secondary | ICD-10-CM | POA: Diagnosis not present

## 2020-10-20 DIAGNOSIS — G4733 Obstructive sleep apnea (adult) (pediatric): Secondary | ICD-10-CM | POA: Diagnosis not present

## 2020-10-20 LAB — GLUCOSE, CAPILLARY
Glucose-Capillary: 117 mg/dL — ABNORMAL HIGH (ref 70–99)
Glucose-Capillary: 109 mg/dL — ABNORMAL HIGH (ref 70–99)
Glucose-Capillary: 148 mg/dL — ABNORMAL HIGH (ref 70–99)
Glucose-Capillary: 112 mg/dL — ABNORMAL HIGH (ref 70–99)

## 2020-10-20 LAB — SARS CORONAVIRUS 2 BY RT PCR (HOSPITAL ORDER, PERFORMED IN ~~LOC~~ HOSPITAL LAB): SARS Coronavirus 2: NEGATIVE

## 2020-10-20 MED ORDER — ASPIRIN EC 81 MG PO TBEC: 81.0000 mg | DELAYED_RELEASE_TABLET | Freq: Every day | ORAL | 11 refills | Status: DC

## 2020-10-20 MED ORDER — ENSURE ENLIVE PO LIQD: 237.0000 mL | Freq: Two times a day (BID) | ORAL | 12 refills | Status: DC

## 2020-10-20 MED ORDER — PANTOPRAZOLE SODIUM 40 MG PO TBEC: 40.0000 mg | DELAYED_RELEASE_TABLET | Freq: Every day | ORAL | Status: DC

## 2020-10-20 MED ORDER — ACETAMINOPHEN 325 MG PO TABS: 650.0000 mg | ORAL_TABLET | Freq: Four times a day (QID) | ORAL | Status: DC | PRN

## 2020-10-20 MED ORDER — ADULT MULTIVITAMIN W/MINERALS CH: 1.0000 | ORAL_TABLET | Freq: Every day | ORAL | Status: DC

## 2020-10-20 MED ORDER — METOPROLOL SUCCINATE ER 100 MG PO TB24: 100.0000 mg | ORAL_TABLET | Freq: Every day | ORAL | Status: DC

## 2020-10-20 NOTE — Care Management Important Message (Signed)
Important Message  Patient Details  Name: Eddie Chapman MRN: [id number] Date of Birth: 02-24-1961   Medicare Important Message Given:  Yes     Dannette Barbara 10/20/2020, 12:56 PM

## 2020-10-20 NOTE — Progress Notes (Signed)
Sample taken for covid test and sent to Lab.

## 2020-10-20 NOTE — TOC Progression Note (Signed)
Transition of Care [facility]) - Progression Note    Patient Details  Name: Eddie Chapman MRN: [id number] Date of Birth: [birth year]  Transition of [facility]) CM/SW Contact  Beverly Sessions, RN Phone Number: 10/20/2020, 10:48 AM  Clinical Narrative:     No SNF bed offers.  Bed search extended        Expected Discharge Plan and Services                                                 Social Determinants of Health (SDOH) Interventions    Readmission Risk Interventions No flowsheet data found.

## 2020-10-20 NOTE — Progress Notes (Signed)
Second attempt for report at [place]  SNF. Still no answer.Marland KitchenMarland KitchenPAtient has been pick up by EMS.

## 2020-10-20 NOTE — TOC Progression Note (Signed)
Transition of Care [facility]) - Progression Note    Patient Details  Name: Eddie Chapman MRN: [id number] Date of Birth: [birth year]  Transition of Care [facility]) CM/SW Contact  Beverly Sessions, RN Phone Number: 10/20/2020, 1:35 PM  Clinical Narrative:     Offer presented. Patient and wife accepted bed at [facility].  TOC spoke with Marlowe Kays at insurance who has started [place] for SNF and EMS with first choice.   Repeat covid test ordered Tele sitter discontinued         Expected Discharge Plan and Services                                                 Social Determinants of Health (SDOH) Interventions    Readmission Risk Interventions No flowsheet data found.

## 2020-10-20 NOTE — Progress Notes (Signed)
Attempted to call report at [facility]. No one answer the call. Will attempt later.

## 2020-10-20 NOTE — Progress Notes (Signed)
[place] for Electrolyte Monitoring and Replacement   Recent Labs: Potassium (mmol/L)  Date Value  10/18/2020 3.8  05/04/2014 4.3   Magnesium (mg/dL)  Date Value  10/18/2020 1.7   Calcium (mg/dL)  Date Value  10/18/2020 9.3   Calcium, Total (mg/dL)  Date Value  05/04/2014 9.1   Albumin (g/dL)  Date Value  10/12/2020 2.7 (L)   Phosphorus (mg/dL)  Date Value  10/17/2020 3.6   Sodium (mmol/L)  Date Value  10/18/2020 139  05/04/2014 140    Assessment: 60 year old male presented with AMS due to septic shock d/t PNA. PMH includes OSA and CHF.  Pharmacy to manage electrolytes.  Nutrition: dysphagia 3 diet + Ensure BID  Goal of Therapy:  Electrolytes WNL  Plan:   No new labs. Seems stable. Pharmacy will sign off at this time. Please reconsult if needed. Thanks.   Oswald Hillock, PharmD Clinical Pharmacist  10/20/2020 12:04 PM

## 2020-10-20 NOTE — Progress Notes (Signed)
Occupational Therapy Treatment Patient Details Name: Eddie Chapman MRN: [id number] DOB: [birth year] Today's Date: 10/20/2020    History of present illness [name] admitted with SEVERE Acute hypoxemic respiratory failure due to CT chest with impressive Left lung pneumonia complicated by Acute metabolic encephalopathy related to critical illness,  Acute kidney injury due to ATN, associated with morbid obesity. Was extubated and transferred to [place].  On 10/11/19 he is unresponsive with snoring respirations and copious secretions requiring intubation for airway protection following Ativan administration. 60 yo M with hx of cerebral aneurysm, PVD, HFrEF, pulmonary htn, morbid obesity, OSA, GERD, hx of TBI, dyslipidemia who came in after girlfriend found him to be unresponsive.  He was found to be severely hypoxemic in circulatory shock with left lung infiltrate on CXR. Labwork consistent with sepsis. Extubated on 10/13/20. Currently on 3L with O2 saturation in the mid 90's.   OT comments  Eddie Chapman was seen for OT treatment on this date. Upon arrival to room pt on bed pan requesting assistance for perihygiene. Pt continues to demonstrate poor safety awareness and insight into deficits - requires MOD cues t/o for safety. MOD A for LBD seated EOB. MOD A + RW + grab bar for toilet t/f from regular commode height - pt tolerated x2 trials c prolonged seated rest breaks. MOD A + single UE support for grooming standing sinkside. Pt verbalized understanding of instruction provided. Pt making good progress toward goals. Pt continues to benefit from skilled OT services to maximize return to PLOF and minimize risk of future falls, injury, caregiver burden, and readmission. Will continue to follow POC. Discharge recommendation remains appropriate.    Follow Up Recommendations  SNF    Equipment Recommendations  3 in 1 bedside commode    Recommendations for Other Services      Precautions / Restrictions  Precautions Precautions: Fall Restrictions Weight Bearing Restrictions: No       Mobility Bed Mobility Overal bed mobility: Needs Assistance Bed Mobility: Supine to Sit;Sit to Supine     Supine to sit: Mod assist Sit to supine: Min assist   General bed mobility comments: MOD A from flat bed with no bed rail use to simulate home environment  Transfers Overall transfer level: Needs assistance Equipment used: Rolling walker (2 wheeled) Transfers: Sit to/from Stand Sit to Stand: Mod assist         General transfer comment: MOD A + grab bar + RW sit<>stand x2 from regulat toilet height    Balance Overall balance assessment: Needs assistance Sitting-balance support: Feet supported;Bilateral upper extremity supported Sitting balance-Leahy Scale: Good     Standing balance support: During functional activity;Single extremity supported Standing balance-Leahy Scale: Poor Standing balance comment: pt at high fall risk 2/2 to BLE weakness and poor safety awareness                           ADL either performed or assessed with clinical judgement   ADL Overall ADL's : Needs assistance/impaired                                       General ADL Comments: MOD A for LBD seated EOB. MOD A + RW + grab bar for toilet t/f from regular commode height - pt tolerated x2 trials c prolonged seated rest breaks. MOD A + single UE support for grooming standing  sinkside               Cognition Arousal/Alertness: Awake/alert Behavior During Therapy: WFL for tasks assessed/performed Overall Cognitive Status: History of cognitive impairments - at baseline                                 General Comments: Patient is very verbose and tangential. Impaired safety awareness        Exercises Exercises: General Lower Extremity General Exercises - Lower Extremity Gluteal Sets: AROM;Strengthening;Both;20 reps;Standing Hip Flexion/Marching:  AROM;Strengthening;Both;20 reps;Standing Other Exercises Other Exercises: Pt educated re: OT role, DME recs, d/c recs, falls prevention, ECS Other Exercises: Tooth brushing, toileting, sup<>sit, sit<>stnad, sitting/standing balance/tolerance, ~40 ft walking           Pertinent Vitals/ Pain       Pain Assessment: No/denies pain         Frequency  Min 3X/week        Progress Toward Goals  OT Goals(current goals can now be found in the care plan section)  Progress towards OT goals: Progressing toward goals  Acute Rehab OT Goals Patient Stated Goal: return to PLOF OT Goal Formulation: With patient/family Time For Goal Achievement: 10/28/20 Potential to Achieve Goals: Good ADL Goals Pt Will Perform Lower Body Dressing: with min assist;sit to/from stand Pt Will Transfer to Toilet: with min assist;bedside commode;ambulating;with +2 assist Pt Will Perform Toileting - Clothing Manipulation and hygiene: sit to/from stand;with set-up;with min guard assist;with 2+ total assist  Plan Frequency remains appropriate;Discharge plan needs to be updated       AM-PAC OT "6 Clicks" Daily Activity     Outcome Measure   Help from another person eating meals?: A Little Help from another person taking care of personal grooming?: A Little Help from another person toileting, which includes using toliet, bedpan, or urinal?: A Lot Help from another person bathing (including washing, rinsing, drying)?: A Lot Help from another person to put on and taking off regular upper body clothing?: A Little Help from another person to put on and taking off regular lower body clothing?: A Lot 6 Click Score: 15    End of Session    OT Visit Diagnosis: Other abnormalities of gait and mobility (R26.89);Muscle weakness (generalized) (M62.81);Other symptoms and signs involving cognitive function   Activity Tolerance Patient tolerated treatment well   Patient Left in bed;with call bell/phone within reach;with  bed alarm set;with family/visitor present   Nurse Communication          Time: 1130-1209 OT Time Calculation (min): 39 min  Charges: OT General Charges $OT Visit: 1 Visit OT Treatments $Self Care/Home Management : 23-37 mins $Therapeutic Exercise: 8-22 mins  Dessie Coma, M.S. OTR/L  10/20/20, 1:40 PM  ascom [phone]

## 2020-10-20 NOTE — Progress Notes (Signed)
PROGRESS NOTE    Eddie Chapman   NFA:[id number]  DOB: 19-Nov-1960  DOA: 09/21/2020     28  PCP: Adin Hector, MD  CC: found unresponsive at home   Hospital Course: [name] hx of cerebral aneurysm, PVD, HFrEF, pulmonary htn, morbid obesity, OSA, GERD, hx of TBI, dyslipidemia who was found unresponsive by his girlfriend at home.  Admitted withAcute hypoxemic respiratory failurerequiring intubation due to left lower lobe community-acquired pneumonia complicated byAcute metabolic encephalopathy related to critical illness, Acute kidney injury due to ATN.  Was extubated and transferred to [place] on 10/10/20, however he became unresponsivewith snoringrespirations and copioussecretionsrequiring reintubation for airway protectionafter Ativan administration.  He was extubated on 10/13/2020.  He was weaned down to room air on 10/17/2020.  Interval History:  SNF search underway. Significant other bedside today.  He is hoping for a facility in [place] that they know of.  Otherwise he is breathing comfortably and hopes to be able to go to rehab.  Old records reviewed in assessment of this patient  ROS: Constitutional: negative for chills and fevers, Respiratory: negative for cough, Cardiovascular: negative for chest pain and Gastrointestinal: negative for abdominal pain  Assessment & Plan: Resolved acute Hypoxic and Hypercapnic Respiratory Failure secondary to left lower lobe community-acquired pneumonia Patient was initially found unresponsive at home by his girlfriend. Admission chest x-ray revealed significant infiltrate involving left lower lobe - completed course of zosyn - followed by SLP; currently on dys 3 diet -Weaned to room air on 10/17/2020 -Continue incentive spirometer and flutter  Hypercarbia with concern for OHS/OSA Obtained ABG on 10/15/2020, showing mild hypercarbia PCO2 58 with corrected pH 7.4 Continue BiPAP at night -Needs outpatient sleep study if not  previously done  Acute on chronic diastolic CHF Continue current cardiac medications, he is on Entresto Continue to follow up cardiology recs Last 2D echo done on 09/22/2020 showed normal LVEF 50 to 55% Continue strict I's and O's and daily weight  Demand ischemia -Considered due to acute respiratory failure Troponin peaked at 56 and trended down  2D echo 09/22/20 preserved LVEF 50-55% Seen by cardiology - remains CP free  Possible aspiration pneumonia, POA - s/p Zosyn, completed 5 days course. Was seen by speech therapist - continue dys 3 diet per SLP Continue aspiration precautions  Morbid obesity.  BMI 41 Recommend weight loss outpatient with healthy dieting and regular physical activity.  Physical debility PT recommending CIR. Insurance denied Fall precautions. Follow up plans for dispo   DVT prophylaxis: Lovenox Code Status: Full Family Communication: none present Disposition Plan: Status is: Inpatient  Remains inpatient appropriate because:Unsafe d/c plan and Inpatient level of care appropriate due to severity of illness   Dispo:  Patient From: Home  Planned Disposition: Ins denied CIR; awaiting alternative plans now; possible SNF if not, then home with HHPT  Expected discharge date: TBD  Medically stable for discharge: Yes  Objective: Blood pressure 136/82, pulse 99, temperature 98.2 F (36.8 C), temperature source Oral, resp. rate 20, height '5\' 10"'  (1.778 m), weight 129.7 kg, SpO2 94 %.  Examination: General appearance: alert, cooperative and no distress Head: Normocephalic, without obvious abnormality, atraumatic Eyes: EOMI Lungs: Bibasilar crackles, no wheezing Heart: regular rate and rhythm and S1, S2 normal Abdomen: normal findings: bowel sounds normal and soft, non-tender Extremities: No edema Skin: mobility and turgor normal Neurologic: Grossly normal  Consultants:     Procedures:     Data Reviewed: I have personally reviewed  following labs  and imaging studies Results for orders placed or performed during the hospital encounter of 09/21/20 (from the past 24 hour(s))  Glucose, capillary     Status: Abnormal   Collection Time: 10/19/20  5:13 PM  Result Value Ref Range   Glucose-Capillary 143 (H) 70 - 99 mg/dL  Glucose, capillary     Status: Abnormal   Collection Time: 10/19/20  7:50 PM  Result Value Ref Range   Glucose-Capillary 110 (H) 70 - 99 mg/dL  Glucose, capillary     Status: Abnormal   Collection Time: 10/19/20 11:45 PM  Result Value Ref Range   Glucose-Capillary 104 (H) 70 - 99 mg/dL  Glucose, capillary     Status: Abnormal   Collection Time: 10/20/20  4:07 AM  Result Value Ref Range   Glucose-Capillary 117 (H) 70 - 99 mg/dL  Glucose, capillary     Status: Abnormal   Collection Time: 10/20/20  7:47 AM  Result Value Ref Range   Glucose-Capillary 109 (H) 70 - 99 mg/dL    No results found for this or any previous visit (from the past 240 hour(s)).   Radiology Studies: No results found. [place] EKG SITE RITE  Final Result    DG Chest [facility] 1 View  Final Result    DG Abd 1 View  Final Result    DG Chest Port 1 View  Final Result    DG Chest Port 1 View  Final Result    DG Chest Port 1 View  Final Result    DG CHEST PORT 1 VIEW  Final Result    [place] EKG SITE RITE  Final Result    CT HEAD WO CONTRAST  Final Result    US Venous Img Lower Bilateral (DVT)  Final Result    DG Chest Port 1 View  Final Result    CT CHEST WO CONTRAST  Final Result    CT HEAD WO CONTRAST  Final Result    DG Chest Port 1 View  Final Result    DG Chest Port 1 View  Final Result    DG Abd Portable 1V  Final Result    CT Head Wo Contrast  Final Result    DG Chest Portable 1 View  Final Result      Scheduled Meds: . chlorhexidine gluconate (MEDLINE KIT)  15 mL Mouth Rinse BID  . Chlorhexidine Gluconate Cloth  6 each Topical Daily  . enoxaparin (LOVENOX) injection  0.5 mg/kg Subcutaneous Q24H   . feeding supplement  237 mL Oral BID BM  . insulin aspart  0-15 Units Subcutaneous Q4H  . metoprolol succinate  100 mg Oral Daily  . multivitamin with minerals  1 tablet Oral Daily  . pantoprazole  40 mg Oral Daily  . sacubitril-valsartan  1 tablet Oral BID   PRN Meds: acetaminophen, docusate, ipratropium-albuterol, labetalol, polyethylene glycol Continuous Infusions: . sodium chloride Stopped (10/16/20 1321)     LOS: 29 days  Time spent: Greater than 50% of the 35 minute visit was spent in counseling/coordination of care for the patient as laid out in the A&P.   Dwyane Dee, MD [facility] 10/20/2020, 12:44 PM

## 2020-10-20 NOTE — TOC Transition Note (Signed)
Transition of Care [facility]) - CM/SW Discharge Note   Patient Details  Name: Eddie Chapman MRN: [id number] Date of Birth: [birth year]  [facility]) CM/SW Contact:  Beverly Sessions, RN Phone Number: 10/20/2020, 4:28 PM   Clinical Narrative:    Patient to discharge to [facility] today Bedside RN to notify wife Repeat covid sent  EMS packet on chart EMS transport arranged for [place] number [id number] SNF auth [id number]   Final next level of care: Skilled Nursing Facility Barriers to Discharge: No Barriers Identified   Patient Goals and CMS Choice        Discharge Placement              Patient chooses bed at: [facility] Patient to be transferred to facility by: EMS Name of family member notified: Bedside RN to notify wife    Discharge Plan and Services                                     Social Determinants of Health (SDOH) Interventions     Readmission Risk Interventions No flowsheet data found.

## 2020-10-20 NOTE — Discharge Summary (Signed)
Physician Discharge Summary   Eddie Chapman [id number] DOB: 1961-01-23 DOA: 09/21/2020  PCP: Adin Hector, MD  Admit date: 09/21/2020 Discharge date: 10/20/2020  Admitted From: home Disposition:  SNF Discharging physician: Dwyane Dee, MD  Recommendations for Outpatient Follow-up:  1. Follow up with cardiology 2. Consider sleep study outpatient if not previously done    Patient discharged to SNF in Discharge Condition: stable CODE STATUS: Full Diet recommendation:  Diet Orders (From admission, onward)    Start     Ordered   10/14/20 1627  DIET DYS 3 Room service appropriate? Yes with Assist; Fluid consistency: Thin  Diet effective now       Comments: NO STRAWS!!!  Extra Gravy on meats, potatoes.  Question Answer Comment  Room service appropriate? Yes with Assist   Fluid consistency: Thin      10/14/20 1627          Hospital Course: [name] hx of cerebral aneurysm, PVD, HFrEF, pulmonary htn, morbid obesity, OSA, GERD, hx of TBI, dyslipidemiawho was found unresponsive by his girlfriend at home. Admitted withAcute hypoxemic respiratory failurerequiring intubation due to left lower lobe community-acquired pneumonia complicated byAcute metabolic encephalopathy related to critical illness, Acute kidney injury due to ATN.  Was extubated and transferred to [place] on 10/10/20, however he becameunresponsivewith snoringrespirations and copioussecretionsrequiring reintubation for airway protectionafterAtivan administration.He was extubated on 10/13/2020.  He was weaned down to room air on 10/17/2020.  Resolved acute Hypoxic and Hypercapnic Respiratory Failure secondary to left lower lobe community-acquired pneumonia Patient was initially found unresponsive at home by his girlfriend. Admission chest x-ray revealed significant infiltrateinvolvingleft lower lobe - completed course of zosyn - followed by SLP; currently on dys 3 diet -Weaned to room air on  10/17/2020 -Continue incentive spirometer and flutter  Hypercarbia with concern for OHS/OSA Obtained ABG on 10/15/2020,showing mild hypercarbia PCO2 58with corrected pH 7.4 Continue BiPAP at night PRN -Needs outpatient sleep study if not previously done  Acute on chronic diastolic CHF Continue current cardiac medications, he is on Entresto - continue asa and Toprol  Continue to follow up cardiology recs Last 2D echo done on 09/22/2020 showed normal LVEF 50 to 55% Continue strict I's and O's and daily weight  Demand ischemia -Considered due to acute respiratory failure Troponin peaked at 56 and trended down  2D echo 09/22/20 preserved LVEF 50-55% Seen by cardiology - remains CP free  Possible aspiration pneumonia, POA - s/p Zosyn, completed5 dayscourse. Was seen by speech therapist - continue dys 3 diet per SLP Continueaspiration precautions  Morbid obesity.  BMI 41 Recommend weight loss outpatient with healthy dieting and regular physical activity.  Physical debility PTrecommending CIR. Insurance denied Fall precautions. Follow up plans for dispo   Principal Diagnosis: Acute respiratory failure with hypoxia and hypercapnia [facility])  Discharge Diagnoses: Active Hospital Problems   Diagnosis Date Noted  . OSA (obstructive sleep apnea) 10/07/2020  . Obesity hypoventilation syndrome ([name]) 10/07/2020  . Acute diastolic CHF (congestive heart failure) ([name]) 10/07/2020  . Morbid obesity with BMI of 40.0-44.9, adult ([name]) 10/07/2020  . Essential hypertension 08/19/2016    Resolved Hospital Problems   Diagnosis Date Noted Date Resolved  . Acute respiratory failure with hypoxia and hypercapnia ([name]) 10/07/2020 10/20/2020  . Pneumonia involving left lung 10/07/2020 10/20/2020  . Acute metabolic encephalopathy [id number] 10/20/2020  . AKI (acute kidney injury) ([name]) 10/07/2020 10/20/2020  . Septic shock [name]) 09/21/2020 10/20/2020    Discharge Instructions     Increase activity slowly  Complete by: As directed      Allergies as of 10/20/2020      Reactions   Divalproex Sodium Other (See Comments)   Elevated liver enzymes   Valproic Acid    unknown      Medication List    STOP taking these medications   fluticasone 50 MCG/ACT nasal spray Commonly known as: FLONASE   HYDROcodone-acetaminophen 5-325 MG tablet Commonly known as: NORCO/VICODIN   lovastatin 40 MG tablet Commonly known as: MEVACOR   meclizine 25 MG tablet Commonly known as: ANTIVERT   omeprazole 20 MG capsule Commonly known as: PRILOSEC   propranolol 40 MG tablet Commonly known as: INDERAL   spironolactone 25 MG tablet Commonly known as: ALDACTONE   telmisartan 20 MG tablet Commonly known as: MICARDIS   traZODone 50 MG tablet Commonly known as: DESYREL     TAKE these medications   acetaminophen 325 MG tablet Commonly known as: TYLENOL Take 2 tablets (650 mg total) by mouth every 6 (six) hours as needed for fever.   aspirin EC 81 MG tablet Take 1 tablet (81 mg total) by mouth daily. What changed:   how much to take  when to take this   Entresto 24-26 MG Generic drug: sacubitril-valsartan Take 1 tablet by mouth 2 (two) times daily.   feeding supplement Liqd Take 237 mLs by mouth 2 (two) times daily between meals. Start taking on: October 21, 2020   metoprolol succinate 100 MG 24 hr tablet Commonly known as: TOPROL-XL Take 1 tablet (100 mg total) by mouth daily. Take with or immediately following a meal. Start taking on: October 21, 2020 What changed: additional instructions   multivitamin with minerals Tabs tablet Take 1 tablet by mouth daily. Start taking on: October 21, 2020   pantoprazole 40 MG tablet Commonly known as: PROTONIX Take 1 tablet (40 mg total) by mouth daily. Start taking on: October 21, 2020            Durable Medical Equipment  (From admission, onward)         Start     Ordered   10/14/20 1322  For home use  only DME 3 n 1  Once        10/14/20 1321          Contact information for after-discharge care    Destination    [facility] Preferred SNF .   Service: Skilled Nursing Contact information: [address] [address] [phone]                 Allergies  Allergen Reactions  . Divalproex Sodium Other (See Comments)    Elevated liver enzymes  . Valproic Acid     unknown     Discharge Exam: BP 136/82 (BP Location: Left Arm)   Pulse 99   Temp 98.2 F (36.8 C) (Oral)   Resp 20   Ht 5\' 10"  (1.778 m)   Wt 129.7 kg   SpO2 94%   BMI 41.03 kg/m  General appearance: alert, cooperative and no distress Head: Normocephalic, without obvious abnormality, atraumatic Eyes: EOMI Lungs: Bibasilar crackles, no wheezing Heart: regular rate and rhythm and S1, S2 normal Abdomen: normal findings: bowel sounds normal and soft, non-tender Extremities: No edema Skin: mobility and turgor normal Neurologic: Grossly normal  The results of significant diagnostics from this hospitalization (including imaging, microbiology, ancillary and laboratory) are listed below for reference.   Microbiology: No results found for this or any previous visit (  from the past 240 hour(s)).   Labs: BNP (last 3 results) Recent Labs    09/21/20 1327 10/01/20 0354  BNP 343.1* 01.0   Basic Metabolic Panel: Recent Labs  Lab 10/14/20 0454 10/15/20 0427 10/16/20 0546 10/17/20 0427 10/18/20 0645  NA 140  --  138 138 139  K 3.5 3.7 3.7 4.0 3.8  CL 99  --  99 99 100  CO2 31  --  31 30 30   GLUCOSE 106*  --  101* 104* 105*  BUN 16  --  14 13 14   CREATININE 0.67  --  0.64 0.68 0.58*  CALCIUM 9.2  --  8.8* 9.3 9.3  MG 1.9 1.6* 1.9 1.8 1.7  PHOS 2.7 2.5 3.5 3.6  --    Liver Function Tests: No results for input(s): AST, ALT, ALKPHOS, BILITOT, PROT, ALBUMIN in the last 168 hours. No results for input(s): LIPASE, AMYLASE in the last 168 hours. No results for  input(s): AMMONIA in the last 168 hours. CBC: Recent Labs  Lab 10/14/20 0454 10/15/20 0427 10/16/20 0546 10/17/20 0427 10/18/20 0645  WBC 8.9 10.6* 9.0 9.0 8.5  NEUTROABS 6.2 7.7 6.3 6.1 5.8  HGB 14.3 15.4 15.1 15.1 14.5  HCT 44.5 47.5 46.4 47.0 44.8  MCV 93.5 92.4 91.9 92.5 91.6  PLT 262 275 274 276 250   Cardiac Enzymes: No results for input(s): CKTOTAL, CKMB, CKMBINDEX, TROPONINI in the last 168 hours. BNP: Invalid input(s): POCBNP CBG: Recent Labs  Lab 10/19/20 1950 10/19/20 2345 10/20/20 0407 10/20/20 0747 10/20/20 1253  GLUCAP 110* 104* 117* 109* 148*   D-Dimer No results for input(s): DDIMER in the last 72 hours. Hgb A1c No results for input(s): HGBA1C in the last 72 hours. Lipid Profile No results for input(s): CHOL, HDL, LDLCALC, TRIG, CHOLHDL, LDLDIRECT in the last 72 hours. Thyroid function studies No results for input(s): TSH, T4TOTAL, T3FREE, THYROIDAB in the last 72 hours.  Invalid input(s): FREET3 Anemia work up No results for input(s): VITAMINB12, FOLATE, FERRITIN, TIBC, IRON, RETICCTPCT in the last 72 hours. Urinalysis    Component Value Date/Time   COLORURINE YELLOW (A) 09/21/2020 1327   APPEARANCEUR HAZY (A) 09/21/2020 1327   LABSPEC 1.022 09/21/2020 1327   PHURINE 5.0 09/21/2020 1327   GLUCOSEU NEGATIVE 09/21/2020 1327   HGBUR MODERATE (A) 09/21/2020 1327   BILIRUBINUR NEGATIVE 09/21/2020 1327   KETONESUR NEGATIVE 09/21/2020 1327   PROTEINUR >=300 (A) 09/21/2020 1327   NITRITE NEGATIVE 09/21/2020 1327   LEUKOCYTESUR NEGATIVE 09/21/2020 1327   Sepsis Labs Invalid input(s): PROCALCITONIN,  WBC,  LACTICIDVEN Microbiology No results found for this or any previous visit (from the past 240 hour(s)).  Procedures/Studies: EEG  Result Date: 09/28/2020 Lora Havens, MD     09/28/2020  4:11 PM Patient Name: Eddie Chapman MRN: [id number] Epilepsy Attending: Lora Havens Referring Physician/Provider: Dr Ina Homes Date: 09/28/2020  Duration: Patient history: 60yo M with chronic R MCA infarct with left gaze preference, not moving LLE. EEG to evaluate for seizure Level of alertness:  comatose AEDs during EEG study: Propofol Technical aspects: This EEG study was done with scalp electrodes positioned according to the 10-20 International system of electrode placement. Electrical activity was acquired at a sampling rate of 500Hz  and reviewed with a high frequency filter of 70Hz  and a low frequency filter of 1Hz . EEG data were recorded continuously and digitally stored. Description: EEG showed continuous generalized 3 to 6 Hz theta-delta slowing with overriding 15 to 18 Hz beta activity with  distributed symmetrically and diffusely.  Hyperventilation and photic stimulation were not performed.   ABNORMALITY -Excessive beta, generalized -Continuous slow, generalized IMPRESSION: This study is suggestive of severe diffuse encephalopathy, nonspecific etiology but likely related to sedation. No seizures or epileptiform discharges were seen throughout the recording. Lora Havens   DG Abd 1 View  Result Date: 10/10/2020 CLINICAL DATA:  ET/OG placement EXAM: PORTABLE CHEST - 1 VIEW; ABDOMEN - 1 VIEW COMPARISON:  Chest radiograph 10/08/2020, abdominal radiograph 09/21/2020 FINDINGS: *Transesophageal tube terminates in the mid trachea, 6 cm from the carina. *Transesophageal tube tip and side port distal to the GE junction, terminating in the left upper quadrant. *Telemetry leads and external support devices overlie the chest. Stable cardiomegaly accounting for differences in technique. Persistent pulmonary vascular congestion and bibasilar opacities, left greater than right. Some basilar atelectatic changes are present as well. No visible pneumothorax. No acute osseous or soft tissue abnormality. Remote deformity of the distal left clavicle with heterotopic ossification. Additional degenerative changes are present in the imaged spine and shoulders.  Abdominal radiograph only partially encompasses the abdomen. No suspicious calcifications or high-grade obstructive bowel gas pattern. No visible subdiaphragmatic free air. IMPRESSION: Satisfactory positioning of the lines and tubes as above. Persistent heterogeneous opacities bilaterally with additional features of pulmonary edema. No acute abnormality within the visualized portions of the abdomen. Electronically Signed   By: Lovena Le M.D.   On: 10/10/2020 05:14   CT HEAD WO CONTRAST  Result Date: 09/28/2020 CLINICAL DATA:  Neuro deficit. EXAM: CT HEAD WITHOUT CONTRAST TECHNIQUE: Contiguous axial images were obtained from the base of the skull through the vertex without intravenous contrast. COMPARISON:  September 25, 2020. FINDINGS: Brain: Right frontal and parietal encephalomalacia is noted consistent with old infarction. No mass effect or midline shift is noted. Ventricular size is within normal limits. There is no evidence of mass lesion, hemorrhage or acute infarction. Vascular: No hyperdense vessel or unexpected calcification. Skull: Normal. Negative for fracture or focal lesion. Sinuses/Orbits: Left maxillary mucous retention cyst is noted. Other: None. IMPRESSION: Right frontal and parietal encephalomalacia consistent with old infarction. No acute intracranial abnormality seen. Electronically Signed   By: Marijo Conception M.D.   On: 09/28/2020 12:12   CT HEAD WO CONTRAST  Result Date: 09/25/2020 CLINICAL DATA:  60 year old male with altered mental status. EXAM: CT HEAD WITHOUT CONTRAST TECHNIQUE: Contiguous axial images were obtained from the base of the skull through the vertex without intravenous contrast. COMPARISON:  Head CT dated 09/21/2020. FINDINGS: Brain: There is a large area of old infarct and encephalomalacia involving the right MCA territory. There is no acute intracranial hemorrhage. No mass effect midline shift no extra-axial fluid collection. Vascular: No hyperdense vessel or  unexpected calcification. Skull: Normal. Negative for fracture or focal lesion. Sinuses/Orbits: There is diffuse mucoperiosteal thickening of paranasal sinuses. No air-fluid level. The mastoid air cells are clear. Other: None IMPRESSION: 1. No acute intracranial hemorrhage. 2. 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 Electronically Signed   By: Anner Crete M.D.   On: 09/25/2020 15:36   CT Head Wo Contrast  Result Date: 09/21/2020 CLINICAL DATA:  Altered mental status EXAM: CT HEAD WITHOUT CONTRAST TECHNIQUE: Contiguous axial images were obtained from the base of the skull through the vertex without intravenous contrast. COMPARISON:  CT 12/14/2018 FINDINGS: Brain: No evidence of acute infarction, hemorrhage, hydrocephalus, extra-axial collection or mass lesion/mass effect. Chronic right hemisphere encephalomalacia predominantly involving the right frontal and temporal lobes,  not appreciably changed from prior. Ex vacuo dilatation of the occipital horn of the right lateral ventricle. Vascular: Known small anterior splenic failure a additional left the. No hyperdense vessel. Skull: Negative for acute calvarial fracture. Sinuses/Orbits: Lupus retention cyst versus sinonasal polyp in the left maxillary sinus. Scattered bilateral ethmoid mucosal thickening. Orbital structures within normal limits. Other: Negative for scalp hematoma. IMPRESSION: 1. No acute intracranial findings. 2. Chronic right hemispheric encephalomalacia, not appreciably changed from prior CT. Electronically Signed   By: Davina Poke D.O.   On: 09/21/2020 14:12   CT CHEST WO CONTRAST  Result Date: 09/25/2020 CLINICAL DATA:  Mental status change. EXAM: CT CHEST WITHOUT CONTRAST TECHNIQUE: Multidetector CT imaging of the chest was performed following the standard protocol without IV contrast. COMPARISON:  Chest x-ray 09/24/2020, chest x-ray 09/22/2020. FINDINGS: Lines and tubes: Endotracheal  tube terminates 0.5 cm above the carina just proximal to the origin of the left mainstem bronchus. Enteric tube courses below the hemidiaphragm with tip and side port collimated off view. Enteric tube is noted to course within the gastric lumen. Right internal jugular venous catheter with tip terminating in the proximal superior vena cava just proximal to the confluence of the superior vena cava and left brachiocephalic vein. Cardiovascular: Cardiomegaly. No significant pericardial effusion. Limited evaluation of the aortic root due to motion artifact and noncontrast study. Suggestion of an ectatic ascending aorta that is completely evaluated on this noncontrast study with ascending aorta measuring at least up to 4 cm. Descending thoracic aorta appears to be normal in caliber. Mild atherosclerotic plaque of the thoracic aorta. At least mild three-vessel coronary artery calcifications. The main left and right pulmonary arteries are enlarged in caliber. Mediastinum/Nodes: Enlarged 1 cm precarinal lymph node. Otherwise prominent mediastinal lymph nodes. No gross hilar adenopathy, noting limited sensitivity for the detection of hilar adenopathy on this noncontrast study. No axillary lymphadenopathy. The esophagus and trachea are unremarkable. The thyroid is partially visualized and grossly unremarkable. Lungs/Pleura: Expiratory phase of respiration. Consolidation of the left lower lobe. No definite evaluation of intraparenchymal abscess formation; however, please note markedly limited evaluation on this noncontrast study no cavitary lesion identified. Passive atelectasis of the right lower lobe. No pulmonary nodule or mass within the aerated lungs. Trace bilateral, right greater than left, pleural effusions. No pneumothorax. Upper Abdomen: A 3.5 cm right adrenal gland mass is noted with a density of 80 Hounsfield units. No other acute abnormality. Musculoskeletal: No abdominal wall hernia or abnormality No suspicious  lytic or blastic osseous lesions. No acute displaced fracture. Old healed left rib fractures. Multilevel degenerative changes of the spine with several mid to lowerthoracic vertebral bodies demonstrating height loss. The T9 vertebral body demonstrates at least 80% vertebral body height loss. IMPRESSION: 1. Endotracheal tube located at the carina, just proximal to the origin of the left mainstem bronchus. This study demonstrates in expiratory phase of respiration, therefore recommend retraction by no more than 2 cm. 2. Left lower lobe consolidation with associated bilateral trace pleural effusions. Associated 1 cm precarinal lymph node that is likely reactive in etiology. No definite abscess formation identified; however, limited evaluation on this noncontrast study. An underlying pulmonary mass cannot be excluded. Followup PA and lateral chest X-ray is recommended in 3-4 weeks following therapy to ensure resolution and exclude underlying malignancy. 3. A indeterminate 3.5 cm right adrenal gland mass. Recommend further evaluation with dedicated CT adrenal protocol. 4. Suggestion of an ectatic ascending aorta with limited evaluation on this noncontrast study. Aortic aneurysm NOS (  ICD10-I71.9). Aortic Atherosclerosis (ICD10-I70.0). 5. Other imaging findings of potential clinical significance: At least mild four-vessel coronary artery calcifications. Cardiomegaly. Multilevel mid to lower thoracic chronic appearing compression fractures with greater than 80% height loss of the T9 vertebral body. These results were called by telephone at the time of interpretation on 09/25/2020 at 3:41 pm to provider [facility] , who verbally acknowledged these results. Electronically Signed   By: Iven Finn M.D.   On: 09/25/2020 15:55   US Venous Img Lower Bilateral (DVT)  Result Date: 09/27/2020 CLINICAL DATA:  60 year old male with fever and leg swelling EXAM: BILATERAL LOWER EXTREMITY VENOUS DOPPLER ULTRASOUND  TECHNIQUE: Gray-scale sonography with graded compression, as well as color Doppler and duplex ultrasound were performed to evaluate the lower extremity deep venous systems from the level of the common femoral vein and including the common femoral, femoral, profunda femoral, popliteal and calf veins including the posterior tibial, peroneal and gastrocnemius veins when visible. The superficial great saphenous vein was also interrogated. Spectral Doppler was utilized to evaluate flow at rest and with distal augmentation maneuvers in the common femoral, femoral and popliteal veins. COMPARISON:  None. FINDINGS: RIGHT LOWER EXTREMITY Common Femoral Vein: No evidence of thrombus. Normal compressibility, respiratory phasicity and response to augmentation. Saphenofemoral Junction: No evidence of thrombus. Normal compressibility and flow on color Doppler imaging. Profunda Femoral Vein: No evidence of thrombus. Normal compressibility and flow on color Doppler imaging. Femoral Vein: No evidence of thrombus. Normal compressibility, respiratory phasicity and response to augmentation. Popliteal Vein: No evidence of thrombus. Normal compressibility, respiratory phasicity and response to augmentation. Calf Veins: No evidence of thrombus. Normal compressibility and flow on color Doppler imaging. Superficial Great Saphenous Vein: No evidence of thrombus. Normal compressibility and flow on color Doppler imaging. Other Findings:  None. LEFT LOWER EXTREMITY Common Femoral Vein: No evidence of thrombus. Normal compressibility, respiratory phasicity and response to augmentation. Saphenofemoral Junction: No evidence of thrombus. Normal compressibility and flow on color Doppler imaging. Profunda Femoral Vein: No evidence of thrombus. Normal compressibility and flow on color Doppler imaging. Femoral Vein: No evidence of thrombus. Normal compressibility, respiratory phasicity and response to augmentation. Popliteal Vein: No evidence of  thrombus. Normal compressibility, respiratory phasicity and response to augmentation. Calf Veins: No evidence of thrombus. Normal compressibility and flow on color Doppler imaging. Superficial Great Saphenous Vein: No evidence of thrombus. Normal compressibility and flow on color Doppler imaging. Other Findings:  None. IMPRESSION: Sonographic survey of the bilateral lower extremities negative for DVT Electronically Signed   By: Corrie Mckusick D.O.   On: 09/27/2020 12:40   DG Chest Port 1 View  Result Date: 10/10/2020 CLINICAL DATA:  ET/OG placement EXAM: PORTABLE CHEST - 1 VIEW; ABDOMEN - 1 VIEW COMPARISON:  Chest radiograph 10/08/2020, abdominal radiograph 09/21/2020 FINDINGS: *Transesophageal tube terminates in the mid trachea, 6 cm from the carina. *Transesophageal tube tip and side port distal to the GE junction, terminating in the left upper quadrant. *Telemetry leads and external support devices overlie the chest. Stable cardiomegaly accounting for differences in technique. Persistent pulmonary vascular congestion and bibasilar opacities, left greater than right. Some basilar atelectatic changes are present as well. No visible pneumothorax. No acute osseous or soft tissue abnormality. Remote deformity of the distal left clavicle with heterotopic ossification. Additional degenerative changes are present in the imaged spine and shoulders. Abdominal radiograph only partially encompasses the abdomen. No suspicious calcifications or high-grade obstructive bowel gas pattern. No visible subdiaphragmatic free air. IMPRESSION: Satisfactory positioning of the lines and  tubes as above. Persistent heterogeneous opacities bilaterally with additional features of pulmonary edema. No acute abnormality within the visualized portions of the abdomen. Electronically Signed   By: Lovena Le M.D.   On: 10/10/2020 05:14   DG Chest Port 1 View  Result Date: 10/08/2020 CLINICAL DATA:  Pneumonia.  Hypertension. EXAM: PORTABLE  CHEST 1 VIEW COMPARISON:  Chest x-rays dated 10/07/2020 and 10/02/2020. FINDINGS: Stable cardiomegaly. Residual opacity within the periphery of the LEFT lower lung, presumably layering pleural effusion extending towards the fissure. Questionable small pleural effusion and/or atelectasis at the RIGHT costophrenic angle. No new lung findings. No pneumothorax is seen. IMPRESSION: 1. Stable chest x-ray. Bibasilar opacities, LEFT greater than RIGHT, most likely small pleural effusions and/or atelectasis. 2. Stable cardiomegaly. Electronically Signed   By: Franki Cabot M.D.   On: 10/08/2020 07:17   DG Chest Port 1 View  Result Date: 10/07/2020 CLINICAL DATA:  Acute respiratory failure with hypoxia EXAM: PORTABLE CHEST 1 VIEW COMPARISON:  Radiograph 10/02/2020, CT 09/25/2020 FINDINGS: Low lung volumes and basilar atelectatic changes. Some pulmonary vascular congestion and cuffing is noted with fissural thickening. Suspect some septal lines as well. There are persistent bilateral effusions including fluid within the fissure on the left. No pneumothorax. Stable cardiomediastinal contours. Removal of the previously seen endotracheal and transesophageal tube. Nasal cannula, telemetry leads overlie the chest. No acute osseous or soft tissue abnormality. Degenerative changes are present in the imaged spine and shoulders. IMPRESSION: 1. Low lung volumes with basilar atelectatic changes. 2. Pulmonary vascular congestion with cuffing which could reflect a degree of interstitial edema. 3. Persistent though slightly diminished bilateral effusions. Electronically Signed   By: Lovena Le M.D.   On: 10/07/2020 02:59   DG Chest Port 1 View  Result Date: 10/02/2020 CLINICAL DATA:  Acute respiratory failure. EXAM: PORTABLE CHEST 1 VIEW COMPARISON:  09/30/2020 FINDINGS: 0514 hours. Endotracheal tube tip is 6.9 cm above the base of the carina. The NG tube passes into the stomach although the distal tip position is not included on  the film. Low volumes with bibasilar collapse/consolidative opacity and small bilateral pleural effusions. There is pulmonary vascular congestion without overt pulmonary edema. The cardio pericardial silhouette is enlarged. IMPRESSION: 1. Endotracheal tube tip is 6.9 cm above the base of the carina. 2. Low volumes with bibasilar collapse/consolidative opacity and small bilateral pleural effusions. Electronically Signed   By: Misty Stanley M.D.   On: 10/02/2020 06:13   DG CHEST PORT 1 VIEW  Result Date: 09/30/2020 CLINICAL DATA:  Respiratory distress EXAM: PORTABLE CHEST 1 VIEW COMPARISON:  09/25/2020 FINDINGS: Interval removal of right central line. Endotracheal tube and NG tube are unchanged. Cardiomegaly. Vascular congestion. Left lower lobe airspace opacity with left effusion, unchanged. IMPRESSION: Stable left pleural effusion with left lower lobe consolidation. Electronically Signed   By: Rolm Baptise M.D.   On: 09/30/2020 14:50   DG Chest Port 1 View  Result Date: 09/25/2020 CLINICAL DATA:  Respiratory failure EXAM: PORTABLE CHEST 1 VIEW COMPARISON:  09/24/2020 FINDINGS: Endotracheal tube is again seen, 5.0 cm above the carina. Nasogastric tube extends into the upper abdomen beyond the margin of the examination. Right internal jugular central venous catheter tip overlies the right innominate vein. Lung volumes are small, but pulmonary insufflation remain stable since prior examination. Left basilar consolidation persists, unchanged. No pneumothorax. Small left pleural effusion is difficult to exclude. Cardiac size is mildly enlarged, unchanged. IMPRESSION: Stable support lines and tubes. Pulmonary hypoinflation, unchanged. Stable left basilar consolidation. Electronically Signed  By: Fidela [place]isbury MD   On: 09/25/2020 23:36   DG Chest Port 1 View  Result Date: 09/24/2020 CLINICAL DATA:  Unresponsive.  Hypoxia EXAM: PORTABLE CHEST 1 VIEW COMPARISON:  Two days ago FINDINGS: Endotracheal tube with  tip halfway between the clavicular heads and carina. Right IJ line with tip near the upper SVC. The enteric tube reaches the stomach. Stable cardiomegaly and vascular pedicle widening with vascular congestion. Asymmetric retrocardiac opacity with pleural fluid. No visible pneumothorax. IMPRESSION: 1. Stable hardware positioning. 2. Cardiomegaly and vascular congestion with left pleural effusion. 3. Retrocardiac atelectasis or pneumonia. Electronically Signed   By: Monte Fantasia M.D.   On: 09/24/2020 07:59   DG Chest Port 1 View  Result Date: 09/22/2020 CLINICAL DATA:  Central line placement. EXAM: PORTABLE CHEST 1 VIEW COMPARISON:  09/21/2020 FINDINGS: Endotracheal tube terminates 3.2 cm above the carina. A new enteric tube courses into the abdomen with tip not imaged. A new right jugular catheter terminates over the upper SVC. The cardiac silhouette remains enlarged. There is persistent asymmetric left basilar airspace opacity with an underlying left pleural effusion. Left basilar aeration is improved from the prior study. No airspace consolidation is evident in the right lung. There is no pneumothorax. IMPRESSION: 1. Interval right jugular catheter placement as above. 2. Improved left basilar lung aeration. Electronically Signed   By: Logan Bores M.D.   On: 09/22/2020 00:20   DG Chest Portable 1 View  Result Date: 09/21/2020 CLINICAL DATA:  Respiratory distress, tachypnea EXAM: PORTABLE CHEST 1 VIEW COMPARISON:  None. FINDINGS: ET tube terminates 3.8 cm above the carina. Heart size is partially obscured but appears enlarged. Atherosclerotic calcification of the aortic knob. Dense left basilar airspace opacity. Small bilateral pleural effusions. No pneumothorax. Possible nondisplaced fractures of the lateral right fourth and fifth ribs. IMPRESSION: 1. ET tube terminates 3.8 cm above the carina. 2. Dense left basilar airspace opacity which may reflect atelectasis versus pneumonia. 3. Small bilateral  pleural effusions. 4. Possible nondisplaced fractures of the lateral right fourth and fifth ribs. Electronically Signed   By: Davina Poke D.O.   On: 09/21/2020 14:01   DG Abd Portable 1V  Result Date: 09/21/2020 CLINICAL DATA:  OG tube placement EXAM: PORTABLE ABDOMEN - 1 VIEW COMPARISON:  Partial comparison to chest radiographs dated 09/21/2020 FINDINGS: Enteric tube terminates in the proximal gastric body. Left lower lobe opacity, likely reflecting combination of atelectasis and pleural effusion, better evaluated on dedicated chest radiograph. IMPRESSION: Enteric tube terminates in the proximal gastric body. Electronically Signed   By: Julian Hy M.D.   On: 09/21/2020 16:40   ECHOCARDIOGRAM COMPLETE  Result Date: 09/22/2020    ECHOCARDIOGRAM REPORT   Patient Name:   Eddie Chapman Date of Exam: 09/22/2020 Medical Rec #:  [id number]     Height:       70.0 in Accession #:    [id number]    Weight:       319.9 lb Date of Birth:  1961-07-04      BSA:          2.548 m Patient Age:    89 years      BP:           103/68 mmHg Patient Gender: M             HR:           66 bpm. Exam Location:  [facility] Procedure: 2D Echo, Color Doppler, Cardiac Doppler and Intracardiac  Opacification Agent Indications:     R01.2 Other cardiac sounds  History:         Patient has no prior history of Echocardiogram examinations.                  Risk Factors:Hypertension and Dyslipidemia.  Sonographer:     Humphrey RollsJoan Heiss RDCS (AE) Referring Phys:  [id number] Vida RiggerFUAD ALESKEROV Diagnosing Phys: Arnoldo HookerBruce Kowalski MD  Sonographer Comments: Echo performed with patient supine and on artificial respirator and Technically difficult study due to poor echo windows. Image acquisition challenging due to patient body habitus. IMPRESSIONS  1. Left ventricular ejection fraction, by estimation, is 50 to 55%. The left ventricle has low normal function. The left ventricle has no regional wall motion abnormalities. Left ventricular diastolic  parameters were normal.  2. Right ventricular systolic function is normal. The right ventricular size is normal.  3. The mitral valve is normal in structure. Trivial mitral valve regurgitation.  4. The aortic valve is normal in structure. Aortic valve regurgitation is not visualized. FINDINGS  Left Ventricle: Left ventricular ejection fraction, by estimation, is 50 to 55%. The left ventricle has low normal function. The left ventricle has no regional wall motion abnormalities. Definity contrast agent was given IV to delineate the left ventricular endocardial borders. The left ventricular internal cavity size was normal in size. There is no left ventricular hypertrophy. Left ventricular diastolic parameters were normal. Right Ventricle: The right ventricular size is normal. No increase in right ventricular wall thickness. Right ventricular systolic function is normal. Left Atrium: Left atrial size was normal in size. Right Atrium: Right atrial size was normal in size. Pericardium: There is no evidence of pericardial effusion. Mitral Valve: The mitral valve is normal in structure. Trivial mitral valve regurgitation. MV peak gradient, 3.1 mmHg. The mean mitral valve gradient is 1.0 mmHg. Tricuspid Valve: The tricuspid valve is normal in structure. Tricuspid valve regurgitation is trivial. Aortic Valve: The aortic valve is normal in structure. Aortic valve regurgitation is not visualized. Aortic valve mean gradient measures 5.0 mmHg. Aortic valve peak gradient measures 8.4 mmHg. Aortic valve area, by VTI measures 2.88 cm. Pulmonic Valve: The pulmonic valve was normal in structure. Pulmonic valve regurgitation is not visualized. Aorta: The aortic root and ascending aorta are structurally normal, with no evidence of dilitation. IAS/Shunts: No atrial level shunt detected by color flow Doppler.  LEFT VENTRICLE PLAX 2D LVIDd:         4.90 cm  Diastology LVIDs:         3.86 cm  LV e' medial:    3.59 cm/s LV PW:         1.61  cm  LV E/e' medial:  16.8 LV IVS:        1.16 cm  LV e' lateral:   5.00 cm/s LVOT diam:     2.20 cm  LV E/e' lateral: 12.1 LV SV:         75 LV SV Index:   29 LVOT Area:     3.80 cm  RIGHT VENTRICLE RV Basal diam:  3.29 cm RV S prime:     11.70 cm/s LEFT ATRIUM           Index       RIGHT ATRIUM           Index LA diam:      4.10 cm 1.61 cm/m  RA Area:     19.40 cm LA Vol (A4C): 56.2 ml 22.05 ml/m RA Volume:  58.70 ml  23.03 ml/m  AORTIC VALVE                    PULMONIC VALVE AV Area (Vmax):    2.55 cm     PV Vmax:       1.06 m/s AV Area (Vmean):   2.67 cm     PV Vmean:      68.200 cm/s AV Area (VTI):     2.88 cm     PV VTI:        0.210 m AV Vmax:           145.00 cm/s  PV Peak grad:  4.5 mmHg AV Vmean:          100.000 cm/s PV Mean grad:  2.0 mmHg AV VTI:            0.260 m AV Peak Grad:      8.4 mmHg AV Mean Grad:      5.0 mmHg LVOT Vmax:         97.30 cm/s LVOT Vmean:        70.200 cm/s LVOT VTI:          0.197 m LVOT/AV VTI ratio: 0.76  AORTA Ao Root diam: 4.10 cm MITRAL VALVE MV Area (PHT): 2.53 cm    SHUNTS MV Peak grad:  3.1 mmHg    Systemic VTI:  0.20 m MV Mean grad:  1.0 mmHg    Systemic Diam: 2.20 cm MV Vmax:       0.88 m/s MV Vmean:      41.4 cm/s MV Decel Time: 300 msec MV E velocity: 60.40 cm/s MV A velocity: 78.00 cm/s MV E/A ratio:  0.77 Serafina Royals MD Electronically signed by Serafina Royals MD Signature Date/Time: 09/22/2020/2:49:51 PM    Final    [place] EKG SITE RITE  Result Date: 10/10/2020 If Site Rite image not attached, placement could not be confirmed due to current cardiac rhythm.  [place] EKG SITE RITE  Result Date: 09/29/2020 If Site Rite image not attached, placement could not be confirmed due to current cardiac rhythm.    Time coordinating discharge: Over 30 minutes    Dwyane Dee, MD  [facility] 10/20/2020, 3:50 PM

## 2020-10-21 LAB — SARS CORONAVIRUS 2 (TAT 6-24 HRS): SARS Coronavirus 2: NEGATIVE

## 2020-10-24 DIAGNOSIS — K219 Gastro-esophageal reflux disease without esophagitis: Secondary | ICD-10-CM | POA: Diagnosis not present

## 2020-10-24 DIAGNOSIS — G4733 Obstructive sleep apnea (adult) (pediatric): Secondary | ICD-10-CM | POA: Diagnosis not present

## 2020-10-24 DIAGNOSIS — G894 Chronic pain syndrome: Secondary | ICD-10-CM | POA: Diagnosis not present

## 2020-10-24 DIAGNOSIS — I5032 Chronic diastolic (congestive) heart failure: Secondary | ICD-10-CM | POA: Diagnosis not present

## 2020-10-26 DIAGNOSIS — R0989 Other specified symptoms and signs involving the circulatory and respiratory systems: Secondary | ICD-10-CM | POA: Diagnosis not present

## 2020-10-26 DIAGNOSIS — G4733 Obstructive sleep apnea (adult) (pediatric): Secondary | ICD-10-CM | POA: Diagnosis not present

## 2020-10-26 DIAGNOSIS — I5033 Acute on chronic diastolic (congestive) heart failure: Secondary | ICD-10-CM | POA: Diagnosis not present

## 2020-10-31 DIAGNOSIS — R0602 Shortness of breath: Secondary | ICD-10-CM | POA: Diagnosis not present

## 2020-10-31 DIAGNOSIS — I5022 Chronic systolic (congestive) heart failure: Secondary | ICD-10-CM | POA: Diagnosis not present

## 2020-10-31 DIAGNOSIS — R0600 Dyspnea, unspecified: Secondary | ICD-10-CM | POA: Diagnosis not present

## 2020-10-31 DIAGNOSIS — R0989 Other specified symptoms and signs involving the circulatory and respiratory systems: Secondary | ICD-10-CM | POA: Diagnosis not present

## 2020-10-31 DIAGNOSIS — I5033 Acute on chronic diastolic (congestive) heart failure: Secondary | ICD-10-CM | POA: Diagnosis not present

## 2020-10-31 DIAGNOSIS — I272 Pulmonary hypertension, unspecified: Secondary | ICD-10-CM | POA: Diagnosis not present

## 2020-11-01 DIAGNOSIS — R0600 Dyspnea, unspecified: Secondary | ICD-10-CM | POA: Diagnosis not present

## 2020-11-01 DIAGNOSIS — R0602 Shortness of breath: Secondary | ICD-10-CM | POA: Diagnosis not present

## 2020-11-01 DIAGNOSIS — I5033 Acute on chronic diastolic (congestive) heart failure: Secondary | ICD-10-CM | POA: Diagnosis not present

## 2020-11-01 DIAGNOSIS — R0989 Other specified symptoms and signs involving the circulatory and respiratory systems: Secondary | ICD-10-CM | POA: Diagnosis not present

## 2020-11-02 DIAGNOSIS — R0600 Dyspnea, unspecified: Secondary | ICD-10-CM | POA: Diagnosis not present

## 2020-11-02 DIAGNOSIS — R0989 Other specified symptoms and signs involving the circulatory and respiratory systems: Secondary | ICD-10-CM | POA: Diagnosis not present

## 2020-11-02 DIAGNOSIS — I5033 Acute on chronic diastolic (congestive) heart failure: Secondary | ICD-10-CM | POA: Diagnosis not present

## 2020-11-02 DIAGNOSIS — R0602 Shortness of breath: Secondary | ICD-10-CM | POA: Diagnosis not present

## 2020-11-08 DIAGNOSIS — R0602 Shortness of breath: Secondary | ICD-10-CM | POA: Diagnosis not present

## 2020-11-08 DIAGNOSIS — R0989 Other specified symptoms and signs involving the circulatory and respiratory systems: Secondary | ICD-10-CM | POA: Diagnosis not present

## 2020-11-08 DIAGNOSIS — R0600 Dyspnea, unspecified: Secondary | ICD-10-CM | POA: Diagnosis not present

## 2020-11-08 DIAGNOSIS — I5033 Acute on chronic diastolic (congestive) heart failure: Secondary | ICD-10-CM | POA: Diagnosis not present

## 2020-11-15 LAB — BLOOD GAS, ARTERIAL
Acid-Base Excess: 15.4 mmol/L — ABNORMAL HIGH (ref 0.0–2.0)
Bicarbonate: 43.5 mmol/L — ABNORMAL HIGH (ref 20.0–28.0)
FIO2: 0.44
O2 Saturation: 94.6 %
Patient temperature: 37
pCO2 arterial: 64 mmHg — ABNORMAL HIGH (ref 32.0–48.0)
pH, Arterial: 7.44 (ref 7.350–7.450)
pO2, Arterial: 71 mmHg — ABNORMAL LOW (ref 83.0–108.0)

## 2020-11-16 DIAGNOSIS — D696 Thrombocytopenia, unspecified: Secondary | ICD-10-CM | POA: Diagnosis not present

## 2020-11-16 DIAGNOSIS — Z125 Encounter for screening for malignant neoplasm of prostate: Secondary | ICD-10-CM | POA: Diagnosis not present

## 2020-11-16 DIAGNOSIS — I10 Essential (primary) hypertension: Secondary | ICD-10-CM | POA: Diagnosis not present

## 2020-11-16 DIAGNOSIS — Z79891 Long term (current) use of opiate analgesic: Secondary | ICD-10-CM | POA: Diagnosis not present

## 2020-11-16 DIAGNOSIS — E785 Hyperlipidemia, unspecified: Secondary | ICD-10-CM | POA: Diagnosis not present

## 2020-11-21 DIAGNOSIS — J9601 Acute respiratory failure with hypoxia: Secondary | ICD-10-CM | POA: Diagnosis not present

## 2020-11-21 DIAGNOSIS — J9602 Acute respiratory failure with hypercapnia: Secondary | ICD-10-CM | POA: Diagnosis not present

## 2020-11-21 DIAGNOSIS — G4733 Obstructive sleep apnea (adult) (pediatric): Secondary | ICD-10-CM | POA: Diagnosis not present

## 2020-11-23 DIAGNOSIS — J9690 Respiratory failure, unspecified, unspecified whether with hypoxia or hypercapnia: Secondary | ICD-10-CM | POA: Diagnosis not present

## 2020-11-23 DIAGNOSIS — R0902 Hypoxemia: Secondary | ICD-10-CM | POA: Diagnosis not present

## 2020-11-23 DIAGNOSIS — I739 Peripheral vascular disease, unspecified: Secondary | ICD-10-CM | POA: Diagnosis not present

## 2020-11-23 DIAGNOSIS — J9811 Atelectasis: Secondary | ICD-10-CM | POA: Diagnosis not present

## 2020-11-23 DIAGNOSIS — I10 Essential (primary) hypertension: Secondary | ICD-10-CM | POA: Diagnosis not present

## 2020-11-23 DIAGNOSIS — E349 Endocrine disorder, unspecified: Secondary | ICD-10-CM | POA: Diagnosis not present

## 2020-11-23 DIAGNOSIS — I5022 Chronic systolic (congestive) heart failure: Secondary | ICD-10-CM | POA: Diagnosis not present

## 2020-11-23 DIAGNOSIS — E785 Hyperlipidemia, unspecified: Secondary | ICD-10-CM | POA: Diagnosis not present

## 2020-11-23 DIAGNOSIS — E559 Vitamin D deficiency, unspecified: Secondary | ICD-10-CM | POA: Diagnosis not present

## 2020-11-23 DIAGNOSIS — J9611 Chronic respiratory failure with hypoxia: Secondary | ICD-10-CM | POA: Diagnosis not present

## 2020-11-23 DIAGNOSIS — D582 Other hemoglobinopathies: Secondary | ICD-10-CM | POA: Diagnosis not present

## 2020-11-23 DIAGNOSIS — Z0000 Encounter for general adult medical examination without abnormal findings: Secondary | ICD-10-CM | POA: Diagnosis not present

## 2020-11-23 DIAGNOSIS — I517 Cardiomegaly: Secondary | ICD-10-CM | POA: Diagnosis not present

## 2020-11-23 DIAGNOSIS — I272 Pulmonary hypertension, unspecified: Secondary | ICD-10-CM | POA: Diagnosis not present

## 2020-11-23 DIAGNOSIS — D696 Thrombocytopenia, unspecified: Secondary | ICD-10-CM | POA: Diagnosis not present

## 2020-11-28 DIAGNOSIS — I5022 Chronic systolic (congestive) heart failure: Secondary | ICD-10-CM | POA: Diagnosis not present

## 2020-11-28 DIAGNOSIS — I272 Pulmonary hypertension, unspecified: Secondary | ICD-10-CM | POA: Diagnosis not present

## 2020-11-29 DIAGNOSIS — J9611 Chronic respiratory failure with hypoxia: Secondary | ICD-10-CM | POA: Diagnosis not present

## 2020-11-29 DIAGNOSIS — I5022 Chronic systolic (congestive) heart failure: Secondary | ICD-10-CM | POA: Diagnosis not present

## 2020-11-29 DIAGNOSIS — I10 Essential (primary) hypertension: Secondary | ICD-10-CM | POA: Diagnosis not present

## 2020-11-29 DIAGNOSIS — I272 Pulmonary hypertension, unspecified: Secondary | ICD-10-CM | POA: Diagnosis not present

## 2020-11-29 DIAGNOSIS — G4733 Obstructive sleep apnea (adult) (pediatric): Secondary | ICD-10-CM | POA: Diagnosis not present

## 2020-11-29 DIAGNOSIS — E785 Hyperlipidemia, unspecified: Secondary | ICD-10-CM | POA: Diagnosis not present

## 2020-11-30 DIAGNOSIS — I5022 Chronic systolic (congestive) heart failure: Secondary | ICD-10-CM | POA: Diagnosis not present

## 2020-11-30 DIAGNOSIS — F32A Depression, unspecified: Secondary | ICD-10-CM | POA: Diagnosis not present

## 2020-11-30 DIAGNOSIS — Z86010 Personal history of colonic polyps: Secondary | ICD-10-CM | POA: Diagnosis not present

## 2020-11-30 DIAGNOSIS — R1310 Dysphagia, unspecified: Secondary | ICD-10-CM | POA: Diagnosis not present

## 2020-11-30 DIAGNOSIS — Z9181 History of falling: Secondary | ICD-10-CM | POA: Diagnosis not present

## 2020-11-30 DIAGNOSIS — Z8701 Personal history of pneumonia (recurrent): Secondary | ICD-10-CM | POA: Diagnosis not present

## 2020-11-30 DIAGNOSIS — M519 Unspecified thoracic, thoracolumbar and lumbosacral intervertebral disc disorder: Secondary | ICD-10-CM | POA: Diagnosis not present

## 2020-11-30 DIAGNOSIS — N400 Enlarged prostate without lower urinary tract symptoms: Secondary | ICD-10-CM | POA: Diagnosis not present

## 2020-11-30 DIAGNOSIS — Z7982 Long term (current) use of aspirin: Secondary | ICD-10-CM | POA: Diagnosis not present

## 2020-11-30 DIAGNOSIS — I110 Hypertensive heart disease with heart failure: Secondary | ICD-10-CM | POA: Diagnosis not present

## 2020-11-30 DIAGNOSIS — I739 Peripheral vascular disease, unspecified: Secondary | ICD-10-CM | POA: Diagnosis not present

## 2020-11-30 DIAGNOSIS — Z6841 Body Mass Index (BMI) 40.0 and over, adult: Secondary | ICD-10-CM | POA: Diagnosis not present

## 2020-11-30 DIAGNOSIS — M47814 Spondylosis without myelopathy or radiculopathy, thoracic region: Secondary | ICD-10-CM | POA: Diagnosis not present

## 2020-11-30 DIAGNOSIS — M5412 Radiculopathy, cervical region: Secondary | ICD-10-CM | POA: Diagnosis not present

## 2020-11-30 DIAGNOSIS — E785 Hyperlipidemia, unspecified: Secondary | ICD-10-CM | POA: Diagnosis not present

## 2020-11-30 DIAGNOSIS — M1990 Unspecified osteoarthritis, unspecified site: Secondary | ICD-10-CM | POA: Diagnosis not present

## 2020-11-30 DIAGNOSIS — I272 Pulmonary hypertension, unspecified: Secondary | ICD-10-CM | POA: Diagnosis not present

## 2020-11-30 DIAGNOSIS — K219 Gastro-esophageal reflux disease without esophagitis: Secondary | ICD-10-CM | POA: Diagnosis not present

## 2020-11-30 DIAGNOSIS — J9611 Chronic respiratory failure with hypoxia: Secondary | ICD-10-CM | POA: Diagnosis not present

## 2020-11-30 DIAGNOSIS — G894 Chronic pain syndrome: Secondary | ICD-10-CM | POA: Diagnosis not present

## 2020-11-30 DIAGNOSIS — E559 Vitamin D deficiency, unspecified: Secondary | ICD-10-CM | POA: Diagnosis not present

## 2020-11-30 DIAGNOSIS — G4733 Obstructive sleep apnea (adult) (pediatric): Secondary | ICD-10-CM | POA: Diagnosis not present

## 2020-12-06 DIAGNOSIS — M519 Unspecified thoracic, thoracolumbar and lumbosacral intervertebral disc disorder: Secondary | ICD-10-CM | POA: Diagnosis not present

## 2020-12-06 DIAGNOSIS — I110 Hypertensive heart disease with heart failure: Secondary | ICD-10-CM | POA: Diagnosis not present

## 2020-12-06 DIAGNOSIS — I272 Pulmonary hypertension, unspecified: Secondary | ICD-10-CM | POA: Diagnosis not present

## 2020-12-06 DIAGNOSIS — I739 Peripheral vascular disease, unspecified: Secondary | ICD-10-CM | POA: Diagnosis not present

## 2020-12-06 DIAGNOSIS — M5412 Radiculopathy, cervical region: Secondary | ICD-10-CM | POA: Diagnosis not present

## 2020-12-06 DIAGNOSIS — I5022 Chronic systolic (congestive) heart failure: Secondary | ICD-10-CM | POA: Diagnosis not present

## 2020-12-06 DIAGNOSIS — J9611 Chronic respiratory failure with hypoxia: Secondary | ICD-10-CM | POA: Diagnosis not present

## 2020-12-08 DIAGNOSIS — R1310 Dysphagia, unspecified: Secondary | ICD-10-CM | POA: Diagnosis not present

## 2020-12-08 DIAGNOSIS — I739 Peripheral vascular disease, unspecified: Secondary | ICD-10-CM | POA: Diagnosis not present

## 2020-12-08 DIAGNOSIS — Z6841 Body Mass Index (BMI) 40.0 and over, adult: Secondary | ICD-10-CM | POA: Diagnosis not present

## 2020-12-08 DIAGNOSIS — I272 Pulmonary hypertension, unspecified: Secondary | ICD-10-CM | POA: Diagnosis not present

## 2020-12-08 DIAGNOSIS — F32A Depression, unspecified: Secondary | ICD-10-CM | POA: Diagnosis not present

## 2020-12-08 DIAGNOSIS — E559 Vitamin D deficiency, unspecified: Secondary | ICD-10-CM | POA: Diagnosis not present

## 2020-12-08 DIAGNOSIS — M519 Unspecified thoracic, thoracolumbar and lumbosacral intervertebral disc disorder: Secondary | ICD-10-CM | POA: Diagnosis not present

## 2020-12-08 DIAGNOSIS — G4733 Obstructive sleep apnea (adult) (pediatric): Secondary | ICD-10-CM | POA: Diagnosis not present

## 2020-12-08 DIAGNOSIS — Z86010 Personal history of colonic polyps: Secondary | ICD-10-CM | POA: Diagnosis not present

## 2020-12-08 DIAGNOSIS — Z9181 History of falling: Secondary | ICD-10-CM | POA: Diagnosis not present

## 2020-12-08 DIAGNOSIS — K219 Gastro-esophageal reflux disease without esophagitis: Secondary | ICD-10-CM | POA: Diagnosis not present

## 2020-12-08 DIAGNOSIS — Z8701 Personal history of pneumonia (recurrent): Secondary | ICD-10-CM | POA: Diagnosis not present

## 2020-12-08 DIAGNOSIS — J9611 Chronic respiratory failure with hypoxia: Secondary | ICD-10-CM | POA: Diagnosis not present

## 2020-12-08 DIAGNOSIS — G894 Chronic pain syndrome: Secondary | ICD-10-CM | POA: Diagnosis not present

## 2020-12-08 DIAGNOSIS — M1990 Unspecified osteoarthritis, unspecified site: Secondary | ICD-10-CM | POA: Diagnosis not present

## 2020-12-08 DIAGNOSIS — M47814 Spondylosis without myelopathy or radiculopathy, thoracic region: Secondary | ICD-10-CM | POA: Diagnosis not present

## 2020-12-08 DIAGNOSIS — M5412 Radiculopathy, cervical region: Secondary | ICD-10-CM | POA: Diagnosis not present

## 2020-12-08 DIAGNOSIS — Z7982 Long term (current) use of aspirin: Secondary | ICD-10-CM | POA: Diagnosis not present

## 2020-12-08 DIAGNOSIS — N400 Enlarged prostate without lower urinary tract symptoms: Secondary | ICD-10-CM | POA: Diagnosis not present

## 2020-12-08 DIAGNOSIS — I110 Hypertensive heart disease with heart failure: Secondary | ICD-10-CM | POA: Diagnosis not present

## 2020-12-08 DIAGNOSIS — E785 Hyperlipidemia, unspecified: Secondary | ICD-10-CM | POA: Diagnosis not present

## 2020-12-08 DIAGNOSIS — I5022 Chronic systolic (congestive) heart failure: Secondary | ICD-10-CM | POA: Diagnosis not present

## 2020-12-14 DIAGNOSIS — I10 Essential (primary) hypertension: Secondary | ICD-10-CM | POA: Diagnosis not present

## 2020-12-14 DIAGNOSIS — I739 Peripheral vascular disease, unspecified: Secondary | ICD-10-CM | POA: Diagnosis not present

## 2020-12-14 DIAGNOSIS — R972 Elevated prostate specific antigen [PSA]: Secondary | ICD-10-CM | POA: Diagnosis not present

## 2020-12-14 DIAGNOSIS — E785 Hyperlipidemia, unspecified: Secondary | ICD-10-CM | POA: Diagnosis not present

## 2020-12-21 DIAGNOSIS — F3342 Major depressive disorder, recurrent, in full remission: Secondary | ICD-10-CM | POA: Diagnosis not present

## 2020-12-21 DIAGNOSIS — I5022 Chronic systolic (congestive) heart failure: Secondary | ICD-10-CM | POA: Diagnosis not present

## 2020-12-21 DIAGNOSIS — G4734 Idiopathic sleep related nonobstructive alveolar hypoventilation: Secondary | ICD-10-CM | POA: Diagnosis not present

## 2020-12-21 DIAGNOSIS — E349 Endocrine disorder, unspecified: Secondary | ICD-10-CM | POA: Diagnosis not present

## 2020-12-21 DIAGNOSIS — I10 Essential (primary) hypertension: Secondary | ICD-10-CM | POA: Diagnosis not present

## 2020-12-21 DIAGNOSIS — J9611 Chronic respiratory failure with hypoxia: Secondary | ICD-10-CM | POA: Diagnosis not present

## 2020-12-21 DIAGNOSIS — E559 Vitamin D deficiency, unspecified: Secondary | ICD-10-CM | POA: Diagnosis not present

## 2020-12-21 DIAGNOSIS — I739 Peripheral vascular disease, unspecified: Secondary | ICD-10-CM | POA: Diagnosis not present

## 2020-12-21 DIAGNOSIS — E785 Hyperlipidemia, unspecified: Secondary | ICD-10-CM | POA: Diagnosis not present

## 2020-12-21 DIAGNOSIS — G4733 Obstructive sleep apnea (adult) (pediatric): Secondary | ICD-10-CM | POA: Diagnosis not present

## 2020-12-21 DIAGNOSIS — I272 Pulmonary hypertension, unspecified: Secondary | ICD-10-CM | POA: Diagnosis not present

## 2020-12-27 DIAGNOSIS — I10 Essential (primary) hypertension: Secondary | ICD-10-CM | POA: Diagnosis not present

## 2020-12-27 DIAGNOSIS — I5022 Chronic systolic (congestive) heart failure: Secondary | ICD-10-CM | POA: Diagnosis not present

## 2020-12-29 DIAGNOSIS — I739 Peripheral vascular disease, unspecified: Secondary | ICD-10-CM | POA: Diagnosis not present

## 2020-12-29 DIAGNOSIS — M47814 Spondylosis without myelopathy or radiculopathy, thoracic region: Secondary | ICD-10-CM | POA: Diagnosis not present

## 2020-12-29 DIAGNOSIS — I5022 Chronic systolic (congestive) heart failure: Secondary | ICD-10-CM | POA: Diagnosis not present

## 2020-12-29 DIAGNOSIS — Z7982 Long term (current) use of aspirin: Secondary | ICD-10-CM | POA: Diagnosis not present

## 2020-12-29 DIAGNOSIS — K219 Gastro-esophageal reflux disease without esophagitis: Secondary | ICD-10-CM | POA: Diagnosis not present

## 2020-12-29 DIAGNOSIS — J9611 Chronic respiratory failure with hypoxia: Secondary | ICD-10-CM | POA: Diagnosis not present

## 2020-12-29 DIAGNOSIS — Z8701 Personal history of pneumonia (recurrent): Secondary | ICD-10-CM | POA: Diagnosis not present

## 2020-12-29 DIAGNOSIS — G894 Chronic pain syndrome: Secondary | ICD-10-CM | POA: Diagnosis not present

## 2020-12-29 DIAGNOSIS — E785 Hyperlipidemia, unspecified: Secondary | ICD-10-CM | POA: Diagnosis not present

## 2020-12-29 DIAGNOSIS — R1310 Dysphagia, unspecified: Secondary | ICD-10-CM | POA: Diagnosis not present

## 2020-12-29 DIAGNOSIS — G4733 Obstructive sleep apnea (adult) (pediatric): Secondary | ICD-10-CM | POA: Diagnosis not present

## 2020-12-29 DIAGNOSIS — M5412 Radiculopathy, cervical region: Secondary | ICD-10-CM | POA: Diagnosis not present

## 2020-12-29 DIAGNOSIS — I110 Hypertensive heart disease with heart failure: Secondary | ICD-10-CM | POA: Diagnosis not present

## 2020-12-29 DIAGNOSIS — Z6841 Body Mass Index (BMI) 40.0 and over, adult: Secondary | ICD-10-CM | POA: Diagnosis not present

## 2020-12-29 DIAGNOSIS — N400 Enlarged prostate without lower urinary tract symptoms: Secondary | ICD-10-CM | POA: Diagnosis not present

## 2020-12-29 DIAGNOSIS — M519 Unspecified thoracic, thoracolumbar and lumbosacral intervertebral disc disorder: Secondary | ICD-10-CM | POA: Diagnosis not present

## 2020-12-29 DIAGNOSIS — M1990 Unspecified osteoarthritis, unspecified site: Secondary | ICD-10-CM | POA: Diagnosis not present

## 2020-12-29 DIAGNOSIS — E559 Vitamin D deficiency, unspecified: Secondary | ICD-10-CM | POA: Diagnosis not present

## 2020-12-29 DIAGNOSIS — Z9181 History of falling: Secondary | ICD-10-CM | POA: Diagnosis not present

## 2020-12-29 DIAGNOSIS — F32A Depression, unspecified: Secondary | ICD-10-CM | POA: Diagnosis not present

## 2020-12-29 DIAGNOSIS — Z86010 Personal history of colonic polyps: Secondary | ICD-10-CM | POA: Diagnosis not present

## 2020-12-29 DIAGNOSIS — I272 Pulmonary hypertension, unspecified: Secondary | ICD-10-CM | POA: Diagnosis not present

## 2021-01-04 DIAGNOSIS — K219 Gastro-esophageal reflux disease without esophagitis: Secondary | ICD-10-CM | POA: Diagnosis not present

## 2021-01-04 DIAGNOSIS — Z9181 History of falling: Secondary | ICD-10-CM | POA: Diagnosis not present

## 2021-01-04 DIAGNOSIS — Z86010 Personal history of colonic polyps: Secondary | ICD-10-CM | POA: Diagnosis not present

## 2021-01-04 DIAGNOSIS — Z8701 Personal history of pneumonia (recurrent): Secondary | ICD-10-CM | POA: Diagnosis not present

## 2021-01-04 DIAGNOSIS — E785 Hyperlipidemia, unspecified: Secondary | ICD-10-CM | POA: Diagnosis not present

## 2021-01-04 DIAGNOSIS — M519 Unspecified thoracic, thoracolumbar and lumbosacral intervertebral disc disorder: Secondary | ICD-10-CM | POA: Diagnosis not present

## 2021-01-04 DIAGNOSIS — R1310 Dysphagia, unspecified: Secondary | ICD-10-CM | POA: Diagnosis not present

## 2021-01-04 DIAGNOSIS — Z7982 Long term (current) use of aspirin: Secondary | ICD-10-CM | POA: Diagnosis not present

## 2021-01-04 DIAGNOSIS — I110 Hypertensive heart disease with heart failure: Secondary | ICD-10-CM | POA: Diagnosis not present

## 2021-01-04 DIAGNOSIS — I739 Peripheral vascular disease, unspecified: Secondary | ICD-10-CM | POA: Diagnosis not present

## 2021-01-04 DIAGNOSIS — I5022 Chronic systolic (congestive) heart failure: Secondary | ICD-10-CM | POA: Diagnosis not present

## 2021-01-04 DIAGNOSIS — M1990 Unspecified osteoarthritis, unspecified site: Secondary | ICD-10-CM | POA: Diagnosis not present

## 2021-01-04 DIAGNOSIS — F32A Depression, unspecified: Secondary | ICD-10-CM | POA: Diagnosis not present

## 2021-01-04 DIAGNOSIS — Z6841 Body Mass Index (BMI) 40.0 and over, adult: Secondary | ICD-10-CM | POA: Diagnosis not present

## 2021-01-04 DIAGNOSIS — G4733 Obstructive sleep apnea (adult) (pediatric): Secondary | ICD-10-CM | POA: Diagnosis not present

## 2021-01-04 DIAGNOSIS — J9611 Chronic respiratory failure with hypoxia: Secondary | ICD-10-CM | POA: Diagnosis not present

## 2021-01-04 DIAGNOSIS — G894 Chronic pain syndrome: Secondary | ICD-10-CM | POA: Diagnosis not present

## 2021-01-04 DIAGNOSIS — M5412 Radiculopathy, cervical region: Secondary | ICD-10-CM | POA: Diagnosis not present

## 2021-01-04 DIAGNOSIS — N400 Enlarged prostate without lower urinary tract symptoms: Secondary | ICD-10-CM | POA: Diagnosis not present

## 2021-01-04 DIAGNOSIS — E559 Vitamin D deficiency, unspecified: Secondary | ICD-10-CM | POA: Diagnosis not present

## 2021-01-04 DIAGNOSIS — I272 Pulmonary hypertension, unspecified: Secondary | ICD-10-CM | POA: Diagnosis not present

## 2021-01-04 DIAGNOSIS — M47814 Spondylosis without myelopathy or radiculopathy, thoracic region: Secondary | ICD-10-CM | POA: Diagnosis not present

## 2021-01-28 DIAGNOSIS — I272 Pulmonary hypertension, unspecified: Secondary | ICD-10-CM | POA: Diagnosis not present

## 2021-01-28 DIAGNOSIS — I5022 Chronic systolic (congestive) heart failure: Secondary | ICD-10-CM | POA: Diagnosis not present

## 2021-02-13 DIAGNOSIS — J9611 Chronic respiratory failure with hypoxia: Secondary | ICD-10-CM | POA: Diagnosis not present

## 2021-02-23 ENCOUNTER — Inpatient Hospital Stay: Payer: PPO

## 2021-02-23 ENCOUNTER — Emergency Department: Payer: PPO

## 2021-02-23 ENCOUNTER — Inpatient Hospital Stay
Admission: EM | Admit: 2021-02-23 | Discharge: 2021-04-05 | DRG: 004 | Payer: PPO | Attending: Internal Medicine | Admitting: Internal Medicine

## 2021-02-23 DIAGNOSIS — Z6841 Body Mass Index (BMI) 40.0 and over, adult: Secondary | ICD-10-CM

## 2021-02-23 DIAGNOSIS — J440 Chronic obstructive pulmonary disease with acute lower respiratory infection: Secondary | ICD-10-CM | POA: Diagnosis present

## 2021-02-23 DIAGNOSIS — R0902 Hypoxemia: Secondary | ICD-10-CM | POA: Diagnosis not present

## 2021-02-23 DIAGNOSIS — Z79899 Other long term (current) drug therapy: Secondary | ICD-10-CM

## 2021-02-23 DIAGNOSIS — F419 Anxiety disorder, unspecified: Secondary | ICD-10-CM | POA: Diagnosis present

## 2021-02-23 DIAGNOSIS — R5081 Fever presenting with conditions classified elsewhere: Secondary | ICD-10-CM | POA: Diagnosis not present

## 2021-02-23 DIAGNOSIS — E669 Obesity, unspecified: Secondary | ICD-10-CM | POA: Diagnosis present

## 2021-02-23 DIAGNOSIS — Z9911 Dependence on respirator [ventilator] status: Secondary | ICD-10-CM | POA: Diagnosis not present

## 2021-02-23 DIAGNOSIS — I2729 Other secondary pulmonary hypertension: Secondary | ICD-10-CM | POA: Diagnosis not present

## 2021-02-23 DIAGNOSIS — Z9119 Patient's noncompliance with other medical treatment and regimen: Secondary | ICD-10-CM

## 2021-02-23 DIAGNOSIS — J90 Pleural effusion, not elsewhere classified: Secondary | ICD-10-CM | POA: Diagnosis not present

## 2021-02-23 DIAGNOSIS — I5023 Acute on chronic systolic (congestive) heart failure: Secondary | ICD-10-CM | POA: Diagnosis not present

## 2021-02-23 DIAGNOSIS — R509 Fever, unspecified: Secondary | ICD-10-CM

## 2021-02-23 DIAGNOSIS — G928 Other toxic encephalopathy: Secondary | ICD-10-CM | POA: Diagnosis present

## 2021-02-23 DIAGNOSIS — E870 Hyperosmolality and hypernatremia: Secondary | ICD-10-CM | POA: Diagnosis not present

## 2021-02-23 DIAGNOSIS — I5022 Chronic systolic (congestive) heart failure: Secondary | ICD-10-CM

## 2021-02-23 DIAGNOSIS — J9602 Acute respiratory failure with hypercapnia: Secondary | ICD-10-CM

## 2021-02-23 DIAGNOSIS — E876 Hypokalemia: Secondary | ICD-10-CM | POA: Diagnosis not present

## 2021-02-23 DIAGNOSIS — I2781 Cor pulmonale (chronic): Secondary | ICD-10-CM | POA: Diagnosis not present

## 2021-02-23 DIAGNOSIS — E873 Alkalosis: Secondary | ICD-10-CM | POA: Diagnosis present

## 2021-02-23 DIAGNOSIS — J9811 Atelectasis: Secondary | ICD-10-CM

## 2021-02-23 DIAGNOSIS — E662 Morbid (severe) obesity with alveolar hypoventilation: Secondary | ICD-10-CM | POA: Diagnosis not present

## 2021-02-23 DIAGNOSIS — I272 Pulmonary hypertension, unspecified: Secondary | ICD-10-CM | POA: Diagnosis not present

## 2021-02-23 DIAGNOSIS — J441 Chronic obstructive pulmonary disease with (acute) exacerbation: Secondary | ICD-10-CM | POA: Diagnosis not present

## 2021-02-23 DIAGNOSIS — J9622 Acute and chronic respiratory failure with hypercapnia: Secondary | ICD-10-CM | POA: Diagnosis present

## 2021-02-23 DIAGNOSIS — R109 Unspecified abdominal pain: Secondary | ICD-10-CM

## 2021-02-23 DIAGNOSIS — Z809 Family history of malignant neoplasm, unspecified: Secondary | ICD-10-CM

## 2021-02-23 DIAGNOSIS — J9621 Acute and chronic respiratory failure with hypoxia: Secondary | ICD-10-CM | POA: Diagnosis not present

## 2021-02-23 DIAGNOSIS — J158 Pneumonia due to other specified bacteria: Secondary | ICD-10-CM | POA: Diagnosis not present

## 2021-02-23 DIAGNOSIS — F32A Depression, unspecified: Secondary | ICD-10-CM | POA: Diagnosis present

## 2021-02-23 DIAGNOSIS — Z430 Encounter for attention to tracheostomy: Secondary | ICD-10-CM | POA: Diagnosis not present

## 2021-02-23 DIAGNOSIS — G9389 Other specified disorders of brain: Secondary | ICD-10-CM | POA: Diagnosis not present

## 2021-02-23 DIAGNOSIS — J189 Pneumonia, unspecified organism: Secondary | ICD-10-CM | POA: Diagnosis present

## 2021-02-23 DIAGNOSIS — D72828 Other elevated white blood cell count: Secondary | ICD-10-CM | POA: Diagnosis not present

## 2021-02-23 DIAGNOSIS — Z20822 Contact with and (suspected) exposure to covid-19: Secondary | ICD-10-CM | POA: Diagnosis not present

## 2021-02-23 DIAGNOSIS — Z515 Encounter for palliative care: Secondary | ICD-10-CM

## 2021-02-23 DIAGNOSIS — E8881 Metabolic syndrome: Secondary | ICD-10-CM | POA: Diagnosis present

## 2021-02-23 DIAGNOSIS — A419 Sepsis, unspecified organism: Secondary | ICD-10-CM | POA: Diagnosis not present

## 2021-02-23 DIAGNOSIS — R4182 Altered mental status, unspecified: Secondary | ICD-10-CM | POA: Diagnosis not present

## 2021-02-23 DIAGNOSIS — R404 Transient alteration of awareness: Secondary | ICD-10-CM | POA: Diagnosis not present

## 2021-02-23 DIAGNOSIS — R069 Unspecified abnormalities of breathing: Secondary | ICD-10-CM | POA: Diagnosis not present

## 2021-02-23 DIAGNOSIS — J9612 Chronic respiratory failure with hypercapnia: Secondary | ICD-10-CM | POA: Diagnosis not present

## 2021-02-23 DIAGNOSIS — D72829 Elevated white blood cell count, unspecified: Secondary | ICD-10-CM

## 2021-02-23 DIAGNOSIS — R6521 Severe sepsis with septic shock: Secondary | ICD-10-CM | POA: Diagnosis not present

## 2021-02-23 DIAGNOSIS — R9341 Abnormal radiologic findings on diagnostic imaging of renal pelvis, ureter, or bladder: Secondary | ICD-10-CM | POA: Diagnosis not present

## 2021-02-23 DIAGNOSIS — M7989 Other specified soft tissue disorders: Secondary | ICD-10-CM

## 2021-02-23 DIAGNOSIS — Z01818 Encounter for other preprocedural examination: Secondary | ICD-10-CM

## 2021-02-23 DIAGNOSIS — I6200 Nontraumatic subdural hemorrhage, unspecified: Secondary | ICD-10-CM | POA: Diagnosis not present

## 2021-02-23 DIAGNOSIS — M47816 Spondylosis without myelopathy or radiculopathy, lumbar region: Secondary | ICD-10-CM | POA: Diagnosis not present

## 2021-02-23 DIAGNOSIS — J80 Acute respiratory distress syndrome: Secondary | ICD-10-CM | POA: Diagnosis not present

## 2021-02-23 DIAGNOSIS — J9601 Acute respiratory failure with hypoxia: Secondary | ICD-10-CM | POA: Diagnosis not present

## 2021-02-23 DIAGNOSIS — I110 Hypertensive heart disease with heart failure: Secondary | ICD-10-CM | POA: Diagnosis not present

## 2021-02-23 DIAGNOSIS — R000 Tachycardia, unspecified: Secondary | ICD-10-CM | POA: Diagnosis not present

## 2021-02-23 DIAGNOSIS — R0602 Shortness of breath: Secondary | ICD-10-CM | POA: Diagnosis not present

## 2021-02-23 DIAGNOSIS — J9690 Respiratory failure, unspecified, unspecified whether with hypoxia or hypercapnia: Secondary | ICD-10-CM | POA: Diagnosis not present

## 2021-02-23 DIAGNOSIS — N179 Acute kidney failure, unspecified: Secondary | ICD-10-CM | POA: Diagnosis not present

## 2021-02-23 DIAGNOSIS — E785 Hyperlipidemia, unspecified: Secondary | ICD-10-CM | POA: Diagnosis not present

## 2021-02-23 DIAGNOSIS — I517 Cardiomegaly: Secondary | ICD-10-CM | POA: Diagnosis not present

## 2021-02-23 DIAGNOSIS — I739 Peripheral vascular disease, unspecified: Secondary | ICD-10-CM

## 2021-02-23 DIAGNOSIS — J9600 Acute respiratory failure, unspecified whether with hypoxia or hypercapnia: Secondary | ICD-10-CM

## 2021-02-23 DIAGNOSIS — Z0189 Encounter for other specified special examinations: Secondary | ICD-10-CM

## 2021-02-23 DIAGNOSIS — G9341 Metabolic encephalopathy: Secondary | ICD-10-CM | POA: Diagnosis not present

## 2021-02-23 DIAGNOSIS — J984 Other disorders of lung: Secondary | ICD-10-CM | POA: Diagnosis not present

## 2021-02-23 DIAGNOSIS — R600 Localized edema: Secondary | ICD-10-CM | POA: Diagnosis not present

## 2021-02-23 DIAGNOSIS — Z66 Do not resuscitate: Secondary | ICD-10-CM | POA: Diagnosis present

## 2021-02-23 DIAGNOSIS — I5021 Acute systolic (congestive) heart failure: Secondary | ICD-10-CM | POA: Diagnosis not present

## 2021-02-23 DIAGNOSIS — J9692 Respiratory failure, unspecified with hypercapnia: Secondary | ICD-10-CM | POA: Diagnosis present

## 2021-02-23 DIAGNOSIS — Z4659 Encounter for fitting and adjustment of other gastrointestinal appliance and device: Secondary | ICD-10-CM

## 2021-02-23 DIAGNOSIS — R0603 Acute respiratory distress: Secondary | ICD-10-CM | POA: Diagnosis present

## 2021-02-23 DIAGNOSIS — K219 Gastro-esophageal reflux disease without esophagitis: Secondary | ICD-10-CM | POA: Diagnosis present

## 2021-02-23 DIAGNOSIS — L819 Disorder of pigmentation, unspecified: Secondary | ICD-10-CM

## 2021-02-23 DIAGNOSIS — I5020 Unspecified systolic (congestive) heart failure: Secondary | ICD-10-CM | POA: Diagnosis not present

## 2021-02-23 DIAGNOSIS — I639 Cerebral infarction, unspecified: Secondary | ICD-10-CM | POA: Diagnosis not present

## 2021-02-23 DIAGNOSIS — Z4682 Encounter for fitting and adjustment of non-vascular catheter: Secondary | ICD-10-CM | POA: Diagnosis not present

## 2021-02-23 DIAGNOSIS — I700 Atherosclerosis of aorta: Secondary | ICD-10-CM | POA: Diagnosis not present

## 2021-02-23 DIAGNOSIS — Z86010 Personal history of colonic polyps: Secondary | ICD-10-CM

## 2021-02-23 LAB — COMPREHENSIVE METABOLIC PANEL
ALT: 20 U/L (ref 0–44)
AST: 30 U/L (ref 15–41)
Albumin: 4.1 g/dL (ref 3.5–5.0)
Alkaline Phosphatase: 54 U/L (ref 38–126)
Anion gap: 9 (ref 5–15)
BUN: 26 mg/dL — ABNORMAL HIGH (ref 8–23)
CO2: 35 mmol/L — ABNORMAL HIGH (ref 22–32)
Calcium: 8.7 mg/dL — ABNORMAL LOW (ref 8.9–10.3)
Chloride: 93 mmol/L — ABNORMAL LOW (ref 98–111)
Creatinine, Ser: 1.13 mg/dL (ref 0.61–1.24)
GFR, Estimated: >60 mL/min (ref >60)
Glucose, Bld: 136 mg/dL — ABNORMAL HIGH (ref 70–99)
Potassium: 5.1 mmol/L (ref 3.5–5.1)
Sodium: 137 mmol/L (ref 135–145)
Total Bilirubin: 1.2 mg/dL (ref 0.3–1.2)
Total Protein: 7.7 g/dL (ref 6.5–8.1)

## 2021-02-23 LAB — BLOOD GAS, ARTERIAL
Acid-Base Excess: 10.6 mmol/L — ABNORMAL HIGH (ref 0.0–2.0)
Acid-Base Excess: 13.2 mmol/L — ABNORMAL HIGH (ref 0.0–2.0)
Bicarbonate: 44.4 mmol/L — ABNORMAL HIGH (ref 20.0–28.0)
Bicarbonate: 40 mmol/L — ABNORMAL HIGH (ref 20.0–28.0)
Delivery systems: POSITIVE
Expiratory PAP: 6
FIO2: 0.6
FIO2: 1
Inspiratory PAP: 24
MECHVT: 550 mL
Mechanical Rate: 24
O2 Saturation: 92.3 %
O2 Saturation: 96.8 %
PEEP: 5 cmH2O
Patient temperature: 37
Patient temperature: 37
RATE: 24 resp/min
pCO2 arterial: 106 mmHg (ref 32.0–48.0)
pCO2 arterial: 55 mmHg — ABNORMAL HIGH (ref 32.0–48.0)
pH, Arterial: 7.23 — ABNORMAL LOW (ref 7.350–7.450)
pH, Arterial: 7.47 — ABNORMAL HIGH (ref 7.350–7.450)
pO2, Arterial: 76 mmHg — ABNORMAL LOW (ref 83.0–108.0)
pO2, Arterial: 83 mmHg (ref 83.0–108.0)

## 2021-02-23 LAB — URINE DRUG SCREEN, QUALITATIVE (ARMC ONLY)
Amphetamines, Ur Screen: NOT DETECTED
Barbiturates, Ur Screen: NOT DETECTED
Benzodiazepine, Ur Scrn: NOT DETECTED
Cannabinoid 50 Ng, Ur ~~LOC~~: POSITIVE — AB
Cocaine Metabolite,Ur ~~LOC~~: NOT DETECTED
MDMA (Ecstasy)Ur Screen: NOT DETECTED
Methadone Scn, Ur: NOT DETECTED
Opiate, Ur Screen: NOT DETECTED
Phencyclidine (PCP) Ur S: NOT DETECTED
Tricyclic, Ur Screen: NOT DETECTED

## 2021-02-23 LAB — CBC WITH DIFFERENTIAL/PLATELET
Abs Immature Granulocytes: 0.2 10*3/uL — ABNORMAL HIGH (ref 0.00–0.07)
Basophils Absolute: 0.1 10*3/uL (ref 0.0–0.1)
Basophils Relative: 0 %
Eosinophils Absolute: 0 10*3/uL (ref 0.0–0.5)
Eosinophils Relative: 0 %
HCT: 52.3 % — ABNORMAL HIGH (ref 39.0–52.0)
Hemoglobin: 16.9 g/dL (ref 13.0–17.0)
Immature Granulocytes: 1 %
Lymphocytes Relative: 7 %
Lymphs Abs: 1.4 10*3/uL (ref 0.7–4.0)
MCH: 32.6 pg (ref 26.0–34.0)
MCHC: 32.3 g/dL (ref 30.0–36.0)
MCV: 100.8 fL — ABNORMAL HIGH (ref 80.0–100.0)
Monocytes Absolute: 2.1 10*3/uL — ABNORMAL HIGH (ref 0.1–1.0)
Monocytes Relative: 11 %
Neutro Abs: 14.9 10*3/uL — ABNORMAL HIGH (ref 1.7–7.7)
Neutrophils Relative %: 81 %
Platelets: 179 10*3/uL (ref 150–400)
RBC: 5.19 MIL/uL (ref 4.22–5.81)
RDW: 13.9 % (ref 11.5–15.5)
WBC: 18.6 10*3/uL — ABNORMAL HIGH (ref 4.0–10.5)
nRBC: 0.6 % — ABNORMAL HIGH (ref 0.0–0.2)

## 2021-02-23 LAB — URINALYSIS, COMPLETE (UACMP) WITH MICROSCOPIC
Bacteria, UA: NONE SEEN
Bilirubin Urine: NEGATIVE
Glucose, UA: NEGATIVE mg/dL
Hgb urine dipstick: NEGATIVE
Ketones, ur: NEGATIVE mg/dL
Leukocytes,Ua: NEGATIVE
Nitrite: NEGATIVE
Protein, ur: 100 mg/dL — AB
Specific Gravity, Urine: 1.01 (ref 1.005–1.030)
Squamous Epithelial / HPF: NONE SEEN (ref 0–5)
pH: 5 (ref 5.0–8.0)

## 2021-02-23 LAB — BLOOD GAS, VENOUS
Acid-Base Excess: 12.3 mmol/L — ABNORMAL HIGH (ref 0.0–2.0)
Bicarbonate: 44.3 mmol/L — ABNORMAL HIGH (ref 20.0–28.0)
Delivery systems: POSITIVE
FIO2: 0.24
O2 Saturation: 77.2 %
Patient temperature: 37
pCO2, Ven: 88 mmHg (ref 44.0–60.0)
pH, Ven: 7.31 (ref 7.250–7.430)
pO2, Ven: 46 mmHg — ABNORMAL HIGH (ref 32.0–45.0)

## 2021-02-23 LAB — RESP PANEL BY RT-PCR (FLU A&B, COVID) ARPGX2
Influenza A by PCR: NEGATIVE
Influenza B by PCR: NEGATIVE
SARS Coronavirus 2 by RT PCR: NEGATIVE

## 2021-02-23 LAB — LACTIC ACID, PLASMA
Lactic Acid, Venous: 1.5 mmol/L (ref 0.5–1.9)
Lactic Acid, Venous: 1.7 mmol/L (ref 0.5–1.9)

## 2021-02-23 LAB — MRSA PCR SCREENING: MRSA by PCR: NEGATIVE

## 2021-02-23 LAB — TROPONIN I (HIGH SENSITIVITY)
Troponin I (High Sensitivity): 10 ng/L (ref <18)
Troponin I (High Sensitivity): 9 ng/L (ref <18)

## 2021-02-23 LAB — BRAIN NATRIURETIC PEPTIDE: B Natriuretic Peptide: 207.7 pg/mL — ABNORMAL HIGH (ref 0.0–100.0)

## 2021-02-23 LAB — STREP PNEUMONIAE URINARY ANTIGEN: Strep Pneumo Urinary Antigen: NEGATIVE

## 2021-02-23 LAB — GLUCOSE, CAPILLARY: Glucose-Capillary: 188 mg/dL — ABNORMAL HIGH (ref 70–99)

## 2021-02-23 MED ORDER — ETOMIDATE 2 MG/ML IV SOLN
INTRAVENOUS | Status: AC | PRN
  Administered 2021-02-23: 20 mg via INTRAVENOUS

## 2021-02-23 MED ORDER — FENTANYL 2500MCG IN NS 250ML (10MCG/ML) PREMIX INFUSION
0.0000 ug/h | INTRAVENOUS | Status: DC
  Administered 2021-02-23: 50 ug/h via INTRAVENOUS
  Administered 2021-02-24 (×2): 250 ug/h via INTRAVENOUS
  Administered 2021-02-25 – 2021-02-26 (×4): 275 ug/h via INTRAVENOUS
  Administered 2021-02-27 – 2021-02-28 (×4): 200 ug/h via INTRAVENOUS
  Administered 2021-03-01: 215 ug/h via INTRAVENOUS
  Administered 2021-03-01: 200 ug/h via INTRAVENOUS
  Administered 2021-03-02: 325 ug/h via INTRAVENOUS
  Administered 2021-03-02: 225 ug/h via INTRAVENOUS
  Administered 2021-03-02: 275 ug/h via INTRAVENOUS
  Administered 2021-03-03 (×2): 300 ug/h via INTRAVENOUS
  Administered 2021-03-03: 400 ug/h via INTRAVENOUS
  Administered 2021-03-04 (×2): 300 ug/h via INTRAVENOUS
  Administered 2021-03-04 – 2021-03-06 (×6): 350 ug/h via INTRAVENOUS
  Administered 2021-03-06: 400 ug/h via INTRAVENOUS
  Administered 2021-03-06: 350 ug/h via INTRAVENOUS
  Filled 2021-02-23 (×29): qty 250

## 2021-02-23 MED ORDER — POLYETHYLENE GLYCOL 3350 17 G PO PACK: 17.0000 g | PACK | Freq: Every day | ORAL | Status: DC | PRN

## 2021-02-23 MED ORDER — SODIUM CHLORIDE 0.9 % IV SOLN
500.0000 mg | INTRAVENOUS | Status: DC
  Administered 2021-02-23 – 2021-02-25 (×3): 500 mg via INTRAVENOUS
  Filled 2021-02-23 (×3): qty 500

## 2021-02-23 MED ORDER — PROPOFOL 1000 MG/100ML IV EMUL: 5.0000 ug/kg/min | INTRAVENOUS | Status: DC

## 2021-02-23 MED ORDER — LACTATED RINGERS IV BOLUS
1000.0000 mL | Freq: Once | INTRAVENOUS | Status: AC
  Administered 2021-02-23: 1000 mL via INTRAVENOUS

## 2021-02-23 MED ORDER — ROCURONIUM BROMIDE 50 MG/5ML IV SOLN
INTRAVENOUS | Status: AC | PRN
  Administered 2021-02-23: 50 mg via INTRAVENOUS

## 2021-02-23 MED ORDER — IPRATROPIUM-ALBUTEROL 0.5-2.5 (3) MG/3ML IN SOLN
RESPIRATORY_TRACT | Status: AC
  Filled 2021-02-23: qty 6

## 2021-02-23 MED ORDER — CHLORHEXIDINE GLUCONATE 0.12% ORAL RINSE (MEDLINE KIT)
15.0000 mL | Freq: Two times a day (BID) | OROMUCOSAL | Status: DC
  Administered 2021-02-23 – 2021-04-05 (×76): 15 mL via OROMUCOSAL

## 2021-02-23 MED ORDER — CHLORHEXIDINE GLUCONATE CLOTH 2 % EX PADS
6.0000 | MEDICATED_PAD | Freq: Every day | CUTANEOUS | Status: DC
  Administered 2021-02-23 – 2021-03-17 (×21): 6 via TOPICAL

## 2021-02-23 MED ORDER — PROPOFOL 1000 MG/100ML IV EMUL
INTRAVENOUS | Status: AC
  Filled 2021-02-23: qty 100

## 2021-02-23 MED ORDER — DOCUSATE SODIUM 50 MG/5ML PO LIQD
100.0000 mg | Freq: Two times a day (BID) | ORAL | Status: DC
  Administered 2021-02-23 – 2021-03-04 (×18): 100 mg
  Filled 2021-02-23 (×20): qty 10

## 2021-02-23 MED ORDER — PROPOFOL 1000 MG/100ML IV EMUL
0.0000 ug/kg/min | INTRAVENOUS | Status: DC
  Administered 2021-02-23: 20 ug/kg/min via INTRAVENOUS
  Administered 2021-02-23: 10 ug/kg/min via INTRAVENOUS
  Administered 2021-02-24: 40 ug/kg/min via INTRAVENOUS
  Administered 2021-02-24: 30 ug/kg/min via INTRAVENOUS
  Administered 2021-02-24: 45 ug/kg/min via INTRAVENOUS
  Administered 2021-02-24: 30 ug/kg/min via INTRAVENOUS
  Administered 2021-02-24: 10 ug/kg/min via INTRAVENOUS
  Filled 2021-02-23 (×5): qty 100

## 2021-02-23 MED ORDER — FENTANYL 2500MCG IN NS 250ML (10MCG/ML) PREMIX INFUSION
INTRAVENOUS | Status: AC
  Administered 2021-02-23: 50 ug/h via INTRAVENOUS
  Filled 2021-02-23: qty 250

## 2021-02-23 MED ORDER — DOCUSATE SODIUM 100 MG PO CAPS: 100.0000 mg | ORAL_CAPSULE | Freq: Two times a day (BID) | ORAL | Status: DC | PRN

## 2021-02-23 MED ORDER — DEXMEDETOMIDINE HCL IN NACL 400 MCG/100ML IV SOLN
0.4000 ug/kg/h | INTRAVENOUS | Status: DC
  Administered 2021-02-23: 0.6 ug/kg/h via INTRAVENOUS
  Administered 2021-02-23: 0.4 ug/kg/h via INTRAVENOUS
  Filled 2021-02-23 (×2): qty 100

## 2021-02-23 MED ORDER — IPRATROPIUM-ALBUTEROL 0.5-2.5 (3) MG/3ML IN SOLN
3.0000 mL | RESPIRATORY_TRACT | Status: DC | PRN
  Administered 2021-02-28: 3 mL via RESPIRATORY_TRACT
  Filled 2021-02-23: qty 3

## 2021-02-23 MED ORDER — METHYLPREDNISOLONE SODIUM SUCC 125 MG IJ SOLR
125.0000 mg | Freq: Once | INTRAMUSCULAR | Status: AC
  Administered 2021-02-23: 125 mg via INTRAVENOUS
  Filled 2021-02-23: qty 2

## 2021-02-23 MED ORDER — PANTOPRAZOLE SODIUM 40 MG IV SOLR
40.0000 mg | Freq: Every day | INTRAVENOUS | Status: DC
  Administered 2021-02-23 – 2021-03-02 (×8): 40 mg via INTRAVENOUS
  Filled 2021-02-23 (×8): qty 40

## 2021-02-23 MED ORDER — ORAL CARE MOUTH RINSE
15.0000 mL | OROMUCOSAL | Status: DC
  Administered 2021-02-23 – 2021-03-31 (×339): 15 mL via OROMUCOSAL

## 2021-02-23 MED ORDER — IPRATROPIUM-ALBUTEROL 0.5-2.5 (3) MG/3ML IN SOLN
6.0000 mL | Freq: Once | RESPIRATORY_TRACT | Status: AC
  Administered 2021-02-23: 6 mL via RESPIRATORY_TRACT

## 2021-02-23 MED ORDER — IPRATROPIUM-ALBUTEROL 0.5-2.5 (3) MG/3ML IN SOLN
3.0000 mL | Freq: Once | RESPIRATORY_TRACT | Status: AC
  Administered 2021-02-23: 3 mL via RESPIRATORY_TRACT
  Filled 2021-02-23: qty 3

## 2021-02-23 MED ORDER — ASPIRIN 300 MG RE SUPP
300.0000 mg | RECTAL | Status: AC
  Administered 2021-02-23: 300 mg via RECTAL
  Filled 2021-02-23: qty 1

## 2021-02-23 MED ORDER — POLYETHYLENE GLYCOL 3350 17 G PO PACK
17.0000 g | PACK | Freq: Every day | ORAL | Status: DC
  Administered 2021-02-23 – 2021-03-03 (×9): 17 g
  Filled 2021-02-23 (×10): qty 1

## 2021-02-23 MED ORDER — FUROSEMIDE 10 MG/ML IJ SOLN
40.0000 mg | Freq: Once | INTRAMUSCULAR | Status: AC
  Administered 2021-02-23: 40 mg via INTRAVENOUS
  Filled 2021-02-23: qty 4

## 2021-02-23 MED ORDER — HEPARIN SODIUM (PORCINE) 5000 UNIT/ML IJ SOLN
5000.0000 [IU] | Freq: Three times a day (TID) | INTRAMUSCULAR | Status: DC
  Administered 2021-02-23 – 2021-03-01 (×18): 5000 [IU] via SUBCUTANEOUS
  Filled 2021-02-23 (×18): qty 1

## 2021-02-23 MED ORDER — FENTANYL BOLUS VIA INFUSION
50.0000 ug | INTRAVENOUS | Status: DC | PRN
  Administered 2021-02-23: 50 ug via INTRAVENOUS
  Administered 2021-02-27 – 2021-03-02 (×7): 100 ug via INTRAVENOUS
  Administered 2021-03-02: 50 ug via INTRAVENOUS
  Administered 2021-03-02: 100 ug via INTRAVENOUS
  Filled 2021-02-23: qty 100

## 2021-02-23 MED ORDER — SODIUM CHLORIDE 0.9 % IV SOLN
250.0000 mL | INTRAVENOUS | Status: DC
  Administered 2021-02-24 – 2021-03-05 (×4): 250 mL via INTRAVENOUS

## 2021-02-23 MED ORDER — ROCURONIUM BROMIDE 50 MG/5ML IV SOLN: 50.0000 mg | Freq: Once | INTRAVENOUS | Status: DC

## 2021-02-23 MED ORDER — SODIUM CHLORIDE 0.9 % IV SOLN
2.0000 g | INTRAVENOUS | Status: DC
  Administered 2021-02-23 – 2021-02-24 (×2): 2 g via INTRAVENOUS
  Filled 2021-02-23 (×3): qty 20

## 2021-02-23 MED ORDER — ASPIRIN 81 MG PO CHEW: 324.0000 mg | CHEWABLE_TABLET | ORAL | Status: AC

## 2021-02-23 MED ORDER — FENTANYL CITRATE (PF) 100 MCG/2ML IJ SOLN: 100.0000 ug | Freq: Once | INTRAMUSCULAR | Status: DC

## 2021-02-23 MED ORDER — NOREPINEPHRINE 4 MG/250ML-% IV SOLN
2.0000 ug/min | INTRAVENOUS | Status: DC
  Administered 2021-02-23: 5 ug/min via INTRAVENOUS
  Administered 2021-02-24: 6 ug/min via INTRAVENOUS
  Administered 2021-02-24: 5.493 ug/min via INTRAVENOUS
  Administered 2021-02-25 – 2021-02-26 (×2): 4 ug/min via INTRAVENOUS
  Administered 2021-02-27: 8 ug/min via INTRAVENOUS
  Administered 2021-02-28: 4 ug/min via INTRAVENOUS
  Administered 2021-02-28: 8 ug/min via INTRAVENOUS
  Administered 2021-03-01: 4 ug/min via INTRAVENOUS
  Administered 2021-03-01: 6 ug/min via INTRAVENOUS
  Filled 2021-02-23 (×10): qty 250

## 2021-02-23 MED ORDER — ETOMIDATE 2 MG/ML IV SOLN: 30.0000 mg | Freq: Once | INTRAVENOUS | Status: DC

## 2021-02-23 MED ORDER — FENTANYL 2500MCG IN NS 250ML (10MCG/ML) PREMIX INFUSION: 50.0000 ug/h | INTRAVENOUS | Status: DC

## 2021-02-23 MED ORDER — FENTANYL CITRATE (PF) 100 MCG/2ML IJ SOLN
INTRAMUSCULAR | Status: AC | PRN
  Administered 2021-02-23: 100 ug via INTRAVENOUS

## 2021-02-23 NOTE — ED Provider Notes (Signed)
[facility] Emergency Department Provider Note   ____________________________________________   Event Date/Time   First MD Initiated Contact with Patient 02/23/21 1245     (approximate)  I have reviewed the triage vital signs and the nursing notes.   HISTORY  Chief Complaint Respiratory Distress    HPI Eddie Chapman is a 60 y.o. male with past medical history of hypertension, hyperlipidemia, peripheral vascular disease, CHF, obesity hypoventilation syndrome, and OSA who presents to the ED complaining of shortness of breath.  History is limited due to significant respiratory distress.  Per EMS, patient has been increasingly short of breath for the past couple of days and when EMS arrived he was found to have O2 sats in the 70s on room air.  He was placed on CPAP and given albuterol x2, seemed to become altered over the course of transport.  On arrival, patient states that the mask is helping his breathing, he denies any fevers or chest pain.  He does state he has had trouble getting his oxygen machine and girlfriend states he has been noncompliant with CPAP at night.        Past Medical History:  Diagnosis Date  . Adenomatous colon polyp   . Depression   . Ear drum perforation    left x2   . GERD (gastroesophageal reflux disease)   . Hyperlipidemia   . Hypertension   . T8 vertebral fracture [facility])     Patient Active Problem List   Diagnosis Date Noted  . Respiratory failure with hypercapnia ([place]) 02/23/2021  . OSA (obstructive sleep apnea) 10/07/2020  . Obesity hypoventilation syndrome ([place]) 10/07/2020  . Acute diastolic CHF (congestive heart failure) ([place]) 10/07/2020  . Morbid obesity with BMI of 40.0-44.9, adult ([place]) 10/07/2020  . Occlusion and stenosis of vertebral artery 08/19/2016  . Aneurysm, cerebral, nonruptured 08/19/2016  . Essential hypertension 08/19/2016  . Hyperlipidemia 08/19/2016    Past Surgical History:  Procedure  Laterality Date  . CARPAL TUNNEL RELEASE     left hand  . COLONOSCOPY  09/18/2009, 09/15/2014  . COLONOSCOPY WITH PROPOFOL N/A 12/29/2017   Procedure: COLONOSCOPY WITH PROPOFOL;  Surgeon: Manya Silvas, MD;  Location: [facility] ENDOSCOPY;  Service: Endoscopy;  Laterality: N/A;  . EYE SURGERY    . FRACTURE SURGERY    . HERNIA REPAIR    . LIPOMA EXCISION    . SPINE SURGERY      Prior to Admission medications   Medication Sig Start Date End Date Taking? Authorizing Provider  acetaminophen (TYLENOL) 325 MG tablet Take 2 tablets (650 mg total) by mouth every 6 (six) hours as needed for fever. 10/20/20   Dwyane Dee, MD  aspirin EC 81 MG tablet Take 1 tablet (81 mg total) by mouth daily. 10/20/20   Dwyane Dee, MD  feeding supplement (ENSURE ENLIVE / ENSURE PLUS) LIQD Take 237 mLs by mouth 2 (two) times daily between meals. 10/21/20   Dwyane Dee, MD  metoprolol succinate (TOPROL-XL) 100 MG 24 hr tablet Take 1 tablet (100 mg total) by mouth daily. Take with or immediately following a meal. 10/21/20   Dwyane Dee, MD  Multiple Vitamin (MULTIVITAMIN WITH MINERALS) TABS tablet Take 1 tablet by mouth daily. 10/21/20   Dwyane Dee, MD  pantoprazole (PROTONIX) 40 MG tablet Take 1 tablet (40 mg total) by mouth daily. 10/21/20   Dwyane Dee, MD  sacubitril-valsartan (ENTRESTO) 24-26 MG Take 1 tablet by mouth 2 (two) times daily.    [provider]  Allergies Divalproex sodium and Valproic acid  Family History  Problem Relation Age of Onset  . Cancer Mother   . Varicose Veins Mother     Social History Social History   Tobacco Use  . Smoking status: Never Smoker  . Smokeless tobacco: Never Used  Vaping Use  . Vaping Use: Never used  Substance Use Topics  . Alcohol use: Never  . Drug use: Never    Review of Systems  Constitutional: No fever/chills Eyes: No visual changes. ENT: No sore throat. Cardiovascular: Denies chest pain. Respiratory: Positive for shortness  of breath. Gastrointestinal: No abdominal pain.  No nausea, no vomiting.  No diarrhea.  No constipation. Genitourinary: Negative for dysuria. Musculoskeletal: Negative for back pain. Skin: Negative for rash. Neurological: Negative for headaches, focal weakness or numbness.  ____________________________________________   PHYSICAL EXAM:  VITAL SIGNS: ED Triage Vitals [02/23/21 1246]  Enc Vitals Group     BP      Pulse      Resp      Temp      Temp src      SpO2      Weight 284 lb 6.3 oz (129 kg)     Height 5\' 10"  (1.778 m)     Head Circumference      Peak Flow      Pain Score 0     Pain Loc      Pain Edu?      Excl. in [place]?     Constitutional: Somnolent but arousable to voice. Eyes: Conjunctivae are normal.  Pupils equal round and reactive to light bilaterally. Head: Atraumatic. Nose: No congestion/rhinnorhea. Mouth/Throat: Mucous membranes are moist. Neck: Normal ROM Cardiovascular: Normal rate, regular rhythm. Grossly normal heart sounds. Respiratory: Tachypneic with increased respiratory effort. Lungs sounds faint bilaterally. Gastrointestinal: Soft and nontender. No distention. Genitourinary: deferred Musculoskeletal: No lower extremity tenderness nor edema. Neurologic:  Normal speech and language. No gross focal neurologic deficits are appreciated. Skin:  Skin is warm, dry and intact. No rash noted. Psychiatric: Unable to assess.  ____________________________________________   LABS (all labs ordered are listed, but only abnormal results are displayed)  Labs Reviewed  CBC WITH DIFFERENTIAL/PLATELET - Abnormal; Notable for the following components:      Result Value   WBC 18.6 (*)    HCT 52.3 (*)    MCV 100.8 (*)    nRBC 0.6 (*)    Neutro Abs 14.9 (*)    Monocytes Absolute 2.1 (*)    Abs Immature Granulocytes 0.20 (*)    All other components within normal limits  COMPREHENSIVE METABOLIC PANEL - Abnormal; Notable for the following components:   Chloride  93 (*)    CO2 35 (*)    Glucose, Bld 136 (*)    BUN 26 (*)    Calcium 8.7 (*)    All other components within normal limits  BRAIN NATRIURETIC PEPTIDE - Abnormal; Notable for the following components:   B Natriuretic Peptide 207.7 (*)    All other components within normal limits  BLOOD GAS, VENOUS - Abnormal; Notable for the following components:   pCO2, Ven 88 (*)    pO2, Ven 46.0 (*)    Bicarbonate 44.3 (*)    Acid-Base Excess 12.3 (*)    All other components within normal limits  RESP PANEL BY RT-PCR (FLU A&B, COVID) ARPGX2  CULTURE, BLOOD (ROUTINE X 2)  CULTURE, BLOOD (ROUTINE X 2)  RESPIRATORY PANEL BY PCR  URINE CULTURE  LACTIC ACID, PLASMA  LACTIC ACID, PLASMA  BLOOD GAS, ARTERIAL  LEGIONELLA PNEUMOPHILA SEROGP 1 UR AG  STREP PNEUMONIAE URINARY ANTIGEN  URINALYSIS, COMPLETE (UACMP) WITH MICROSCOPIC  URINE DRUG SCREEN, QUALITATIVE ([facility] ONLY)  TROPONIN I (HIGH SENSITIVITY)  TROPONIN I (HIGH SENSITIVITY)   ____________________________________________  EKG  ED ECG REPORT I, Blake Divine, the attending physician, personally viewed and interpreted this ECG.   Date: 02/23/2021  EKG Time: 12:47  Rate: 89  Rhythm: normal sinus rhythm  Axis: Normal  Intervals:none  ST&T Change: None   PROCEDURES  Procedure(s) performed (including Critical Care):  .Critical Care Performed by: Blake Divine, MD Authorized by: Blake Divine, MD   Critical care provider statement:    Critical care time (minutes):  45   Critical care time was exclusive of:  Separately billable procedures and treating other patients and teaching time   Critical care was necessary to treat or prevent imminent or life-threatening deterioration of the following conditions:  Respiratory failure   Critical care was time spent personally by me on the following activities:  Discussions with consultants, evaluation of patient's response to treatment, examination of patient, ordering and performing  treatments and interventions, ordering and review of laboratory studies, ordering and review of radiographic studies, pulse oximetry, re-evaluation of patient's condition, obtaining history from patient or surrogate and review of old charts   I assumed direction of critical care for this patient from another provider in my specialty: no     Care discussed with: admitting provider       ____________________________________________   INITIAL IMPRESSION / ASSESSMENT AND PLAN / ED COURSE       60 year old male with past medical history of hypertension, hyperlipidemia, peripheral vascular disease, CHF, obesity hypoventilation syndrome, and OSA who presents to the ED for respiratory distress.  Patient transitioned from CPAP to BiPAP upon arrival in the ED, does state that the mask seems to help his breathing.  Lung sounds are faint bilaterally, no obvious wheezing heard but given his poor air movement with history of COPD we will give duo nebs and steroids.  Obesity hypoventilation syndrome and OSA are likely contributing to his presentation, especially given he has been noncompliant with CPAP.  Chest x-ray reviewed by me and shows cardiomegaly with diffuse pulmonary edema, we will give dose of IV Lasix.  No signs of recurrent pneumonia at this time and we will hold off on antibiotics.  Patient continues to be somnolent on BiPAP, was evaluated with ICU team at the bedside.  He has responded well to noninvasive ventilation in the past and we will continue to observe on BiPAP following diuresis.  Patient to be admitted to ICU service at this time.      ____________________________________________   FINAL CLINICAL IMPRESSION(S) / ED DIAGNOSES  Final diagnoses:  Acute respiratory failure with hypoxia and hypercapnia (HCC)  Obesity hypoventilation syndrome (HCC)  Acute on chronic systolic congestive heart failure [facility])     ED Discharge Orders    None       Note:  This document was  prepared using Dragon voice recognition software and may include unintentional dictation errors.   Blake Divine, MD 02/23/21 [phone]

## 2021-02-23 NOTE — ED Notes (Signed)
Dr. [name] at bedside

## 2021-02-23 NOTE — Sepsis Progress Note (Signed)
Full amount of fluids not given for sepsis protocol due to volume overload. Lasix given.

## 2021-02-23 NOTE — Progress Notes (Signed)
CODE SEPSIS - PHARMACY COMMUNICATION  **[facility] Spectrum Antibiotics should be administered within 1 hour of Sepsis diagnosis**  Time Code Sepsis Called/Page Received: 1707  Antibiotics Ordered: Azithromycin, Ceftriaxone  Time of 1st antibiotic administration: 1720  Additional action taken by pharmacy:    If necessary, Name of Provider/Nurse Contacted:      Noralee Space ,PharmD Clinical Pharmacist  02/23/2021  5:41 PM

## 2021-02-23 NOTE — H&P (Signed)
CRITICAL CARE PROGRESS NOTE    Name: Eddie Chapman MRN: [id number] DOB: 1961/02/08     LOS: 0  Referring physician: Dr Charna Archer  SUBJECTIVE FINDINGS & SIGNIFICANT EVENTS    Patient description:   Patient has a complex comorbid history including systolic CHF chronically, pulmonary hypertension, obstructive sleep apnea, chronic hypoxemia with respiratory failure, obesity hypoventilation syndrome with thoracic restriction, hormonal dysfunction with hypotestosteronism, dyslipidemia, metabolic syndrome, previous subarachnoid hemorrhage, history of traumatic thoracic spinal injury, depression.  Patient is hypoxemic at rest during interview and examination today.  Patient was admitted in January to the intensive care unit with altered mental status acute hypoxemic and hypercapnic respiratory failure with metabolic encephalopathy and left lower lobe severe pneumonia.  Throughout hospitalization he was noted to have lethargy with hypercapnic encephalopathy and became unresponsive a few times even postextubation while on medical floor.  Patient has never smoked in the past.  On this admission he is poorly responsive with hypercapnic hypoxemic respiratory failure  Lines/tubes :   Microbiology/Sepsis markers: Results for orders placed or performed during the hospital encounter of 09/21/20  Resp Panel by RT-PCR (Flu A&B, Covid) Nasopharyngeal Swab     Status: None   Collection Time: 09/21/20  2:12 PM   Specimen: Nasopharyngeal Swab; Nasopharyngeal(NP) swabs in vial transport medium  Result Value Ref Range Status   SARS Coronavirus 2 by RT PCR NEGATIVE NEGATIVE Final    Comment: (NOTE) SARS-CoV-2 target nucleic acids are NOT DETECTED.  The SARS-CoV-2 RNA is generally detectable in upper respiratory specimens during the  acute phase of infection. The lowest concentration of SARS-CoV-2 viral copies this assay can detect is 138 copies/mL. A negative result does not preclude SARS-Cov-2 infection and should not be used as the sole basis for treatment or other patient management decisions. A negative result may occur with  improper specimen collection/handling, submission of specimen other than nasopharyngeal swab, presence of viral mutation(s) within the areas targeted by this assay, and inadequate number of viral copies(<138 copies/mL). A negative result must be combined with clinical observations, patient history, and epidemiological information. The expected result is Negative.  Fact Sheet for Patients:  EntrepreneurPulse.com.au  Fact Sheet for Healthcare Providers:  IncredibleEmployment.be  This test is no t yet approved or cleared by the [place] FDA and  has been authorized for detection and/or diagnosis of SARS-CoV-2 by FDA under an Emergency Use Authorization (EUA). This EUA will remain  in effect (meaning this test can be used) for the duration of the COVID-19 declaration under Section 564(b)(1) of the Act, 21 U.S.C.section 360bbb-3(b)(1), unless the authorization is terminated  or revoked sooner.       Influenza A by PCR NEGATIVE NEGATIVE Final   Influenza B by PCR NEGATIVE NEGATIVE Final    Comment: (NOTE) The Xpert Xpress SARS-CoV-2/FLU/RSV plus assay is intended as an aid in the diagnosis of influenza from Nasopharyngeal swab specimens and should not be used as a sole basis for treatment. Nasal washings and aspirates are unacceptable for Xpert Xpress SARS-CoV-2/FLU/RSV testing.  Fact Sheet for Patients: EntrepreneurPulse.com.au  Fact Sheet for Healthcare Providers: IncredibleEmployment.be  This test is not yet approved or cleared by the [place] FDA and has been authorized for detection and/or  diagnosis of SARS-CoV-2 by FDA under an Emergency Use Authorization (EUA). This EUA will remain in effect (meaning this test can be used) for the duration of the COVID-19 declaration under Section 564(b)(1) of the Act, 21 U.S.C. section 360bbb-3(b)(1), unless the authorization is terminated or revoked.  Performed at [facility], [address] [place], [place] [address]   Culture, blood (routine x 2)     Status: None   Collection Time: 09/21/20  2:45 PM   Specimen: BLOOD  Result Value Ref Range Status   Specimen Description BLOOD BLOOD RIGHT FOREARM  Final   Special Requests   Final    BOTTLES DRAWN AEROBIC AND ANAEROBIC Blood Culture results may not be optimal due to an inadequate volume of blood received in culture bottles   Culture   Final    NO GROWTH 5 DAYS Performed at [facility], [address] [place], [place] [address]    Report Status 09/26/2020 FINAL  Final  Culture, blood (routine x 2)     Status: None   Collection Time: 09/21/20  2:45 PM   Specimen: BLOOD  Result Value Ref Range Status   Specimen Description BLOOD RIGHT ANTECUBITAL  Final   Special Requests   Final    BOTTLES DRAWN AEROBIC AND ANAEROBIC Blood Culture results may not be optimal due to an excessive volume of blood received in culture bottles   Culture   Final    NO GROWTH 5 DAYS Performed at [facility], [address] [place], [place] [address]    Report Status 09/26/2020 FINAL  Final  Culture, respiratory (non-expectorated)     Status: None   Collection Time: 09/21/20  3:14 PM   Specimen: Tracheal Aspirate; Respiratory  Result Value Ref Range Status   Specimen Description   Final    TRACHEAL ASPIRATE Performed at [facility], [address] [place], [place] [address]    Special Requests   Final    NONE Performed at [facility], [address] [place], [place] [address]    Gram Stain   Final    FEW WBC PRESENT,BOTH PMN AND  MONONUCLEAR FEW GRAM POSITIVE COCCI IN PAIRS IN CHAINS FEW GRAM NEGATIVE RODS    Culture   Final    FEW Normal respiratory flora-no Staph aureus or Pseudomonas seen Performed at [facility], [place] [address] [place], [place] [address]    Report Status 09/24/2020 FINAL  Final  MRSA PCR Screening     Status: None   Collection Time: 09/21/20  4:15 PM   Specimen: Nasal Mucosa; Nasopharyngeal  Result Value Ref Range Status   MRSA by PCR NEGATIVE NEGATIVE Final    Comment:        The GeneXpert MRSA Assay (FDA approved for NASAL specimens only), is one component of a comprehensive MRSA colonization surveillance program. It is not intended to diagnose MRSA infection nor to guide or monitor treatment for MRSA infections. Performed at [facility], [address] [place], [name] [address]   Culture, respiratory     Status: None   Collection Time: 09/22/20 11:40 AM   Specimen: Tracheal Aspirate; Respiratory  Result Value Ref Range Status   Specimen Description   Final    TRACHEAL ASPIRATE Performed at [facility], [address], [place], [place] [address]    Special Requests   Final    NONE Performed at [facility], [address] [place], [place] [address]    Gram Stain   Final    ABUNDANT WBC PRESENT, PREDOMINANTLY PMN NO ORGANISMS SEEN    Culture   Final    RARE Normal respiratory flora-no Staph aureus or Pseudomonas seen Performed at [facility] [address], [place], [place]  [address]    Report Status 09/25/2020 FINAL  Final  CULTURE, BLOOD (ROUTINE X 2) w Reflex to ID Panel     Status: Abnormal   Collection Time: 09/26/20 10:41 PM   Specimen: BLOOD  Result Value Ref Range Status   Specimen Description   Final    BLOOD BLOOD RIGHT HAND Performed at [facility], [address] [place], [place] [address]    Special Requests   Final    BOTTLES DRAWN AEROBIC AND ANAEROBIC Blood Culture results may not be  optimal due to an excessive volume of blood received in culture bottles Performed at [facility], [address] [place], [place] [address]    Culture  Setup Time   Final    GRAM POSITIVE COCCI ANAEROBIC BOTTLE ONLY CRITICAL RESULT CALLED TO, READ BACK BY AND VERIFIED WITH: NATHAN BELUE AT 0518 09/28/20 SDR    Culture (A)  Final    STAPHYLOCOCCUS EPIDERMIDIS THE SIGNIFICANCE OF ISOLATING THIS ORGANISM FROM A SINGLE SET OF BLOOD CULTURES WHEN MULTIPLE SETS ARE DRAWN IS UNCERTAIN. PLEASE NOTIFY THE MICROBIOLOGY DEPARTMENT WITHIN ONE WEEK IF SPECIATION AND SENSITIVITIES ARE REQUIRED. Performed at [facility], [place] [address] [place], [place] [address]    Report Status 09/30/2020 FINAL  Final  Blood Culture ID Panel (Reflexed)     Status: Abnormal   Collection Time: 09/26/20 10:41 PM  Result Value Ref Range Status   Enterococcus faecalis NOT DETECTED NOT DETECTED Final   Enterococcus Faecium NOT DETECTED NOT DETECTED Final   Listeria monocytogenes NOT DETECTED NOT DETECTED Final   Staphylococcus species DETECTED (A) NOT DETECTED Final    Comment: CRITICAL RESULT CALLED TO, READ BACK BY AND VERIFIED WITH:  NATHAN BELUE AT 5189 09/28/20 SDR    Staphylococcus aureus (BCID) NOT DETECTED NOT DETECTED Final   Staphylococcus epidermidis DETECTED (A) NOT DETECTED Final    Comment: Methicillin (oxacillin) resistant coagulase negative staphylococcus. Possible blood culture contaminant (unless isolated from more than one blood culture draw or clinical case suggests pathogenicity). No antibiotic treatment is indicated for blood  culture contaminants. CRITICAL RESULT CALLED TO, READ BACK BY AND VERIFIED WITH:  NATHAN BELUE AT 8421 09/28/20 SDR    Staphylococcus lugdunensis NOT DETECTED NOT DETECTED Final   Streptococcus species NOT DETECTED NOT DETECTED Final   Streptococcus agalactiae NOT DETECTED NOT DETECTED Final   Streptococcus pneumoniae NOT DETECTED NOT DETECTED Final    Streptococcus pyogenes NOT DETECTED NOT DETECTED Final   A.calcoaceticus-baumannii NOT DETECTED NOT DETECTED Final   Bacteroides fragilis NOT DETECTED NOT DETECTED Final   Enterobacterales NOT DETECTED NOT DETECTED Final   Enterobacter cloacae complex NOT DETECTED NOT DETECTED Final   Escherichia coli NOT DETECTED NOT DETECTED Final   Klebsiella aerogenes NOT DETECTED NOT DETECTED Final   Klebsiella oxytoca NOT DETECTED NOT DETECTED Final   Klebsiella pneumoniae NOT DETECTED NOT DETECTED Final   Proteus species NOT DETECTED NOT DETECTED Final   Salmonella species NOT DETECTED NOT DETECTED Final   Serratia marcescens NOT DETECTED NOT DETECTED Final   Haemophilus influenzae NOT DETECTED NOT DETECTED Final   Neisseria meningitidis NOT DETECTED NOT DETECTED Final   Pseudomonas aeruginosa NOT DETECTED NOT DETECTED Final   Stenotrophomonas maltophilia NOT DETECTED NOT DETECTED Final   Candida albicans NOT DETECTED NOT DETECTED Final   Candida auris NOT DETECTED NOT DETECTED Final   Candida glabrata NOT DETECTED NOT DETECTED Final   Candida krusei NOT DETECTED NOT DETECTED Final   Candida parapsilosis NOT DETECTED  NOT DETECTED Final   Candida tropicalis NOT DETECTED NOT DETECTED Final   Cryptococcus neoformans/gattii NOT DETECTED NOT DETECTED Final   Methicillin resistance mecA/C DETECTED (A) NOT DETECTED Final    Comment: CRITICAL RESULT CALLED TO, READ BACK BY AND VERIFIED WITHLloyd Huger AT 4174 09/28/20 SDR Performed at [facility], [address] [place], [place] [address]   CULTURE, BLOOD (ROUTINE X 2) w Reflex to ID Panel     Status: None   Collection Time: 09/26/20 11:57 PM   Specimen: BLOOD  Result Value Ref Range Status   Specimen Description BLOOD RIGHT RADIAL  Final   Special Requests   Final    BOTTLES DRAWN AEROBIC AND ANAEROBIC Blood Culture adequate volume   Culture   Final    NO GROWTH 5 DAYS Performed at [facility], [address],  [place], [place] [address]    Report Status 10/02/2020 FINAL  Final  SARS CORONAVIRUS 2 (TAT 6-24 HRS) Nasopharyngeal Nasopharyngeal Swab     Status: None   Collection Time: 10/20/20  1:04 PM   Specimen: Nasopharyngeal Swab  Result Value Ref Range Status   SARS Coronavirus 2 NEGATIVE NEGATIVE Final    Comment: (NOTE) SARS-CoV-2 target nucleic acids are NOT DETECTED.  The SARS-CoV-2 RNA is generally detectable in upper and lower respiratory specimens during the acute phase of infection. Negative results do not preclude SARS-CoV-2 infection, do not rule out co-infections with other pathogens, and should not be used as the sole basis for treatment or other patient management decisions. Negative results must be combined with clinical observations, patient history, and epidemiological information. The expected result is Negative.  Fact Sheet for Patients: SugarRoll.be  Fact Sheet for Healthcare Providers: https://www.woods-mathews.com/  This test is not yet approved or cleared by the [place] FDA and  has been authorized for detection and/or diagnosis of SARS-CoV-2 by FDA under an Emergency Use Authorization (EUA). This EUA will remain  in effect (meaning this test can be used) for the duration of the COVID-19 declaration under Se ction 564(b)(1) of the Act, 21 U.S.C. section 360bbb-3(b)(1), unless the authorization is terminated or revoked sooner.  Performed at [facility], [place] [address] [place], [place] [address]   SARS Coronavirus 2 by RT PCR (hospital order, performed in [facility] hospital lab) Nasopharyngeal Nasopharyngeal Swab     Status: None   Collection Time: 10/20/20  3:50 PM   Specimen: Nasopharyngeal Swab  Result Value Ref Range Status   SARS Coronavirus 2 NEGATIVE NEGATIVE Final    Comment: (NOTE) SARS-CoV-2 target nucleic acids are NOT DETECTED.  The SARS-CoV-2 RNA is generally detectable in upper and  lower respiratory specimens during the acute phase of infection. The lowest concentration of SARS-CoV-2 viral copies this assay can detect is 250 copies / mL. A negative result does not preclude SARS-CoV-2 infection and should not be used as the sole basis for treatment or other patient management decisions.  A negative result may occur with improper specimen collection / handling, submission of specimen other than nasopharyngeal swab, presence of viral mutation(s) within the areas targeted by this assay, and inadequate number of viral copies (<250 copies / mL). A negative result must be combined with clinical observations, patient history, and epidemiological information.  Fact Sheet for Patients:   StrictlyIdeas.no  Fact Sheet for Healthcare Providers: BankingDealers.co.za  This test is not yet approved or  cleared by the [place] FDA and has been authorized for detection and/or diagnosis  of SARS-CoV-2 by FDA under an Emergency Use Authorization (EUA).  This EUA will remain in effect (meaning this test can be used) for the duration of the COVID-19 declaration under Section 564(b)(1) of the Act, 21 U.S.C. section 360bbb-3(b)(1), unless the authorization is terminated or revoked sooner.  Performed at [facility], [address] [place], [place] [address]     Anti-infectives:  Anti-infectives (From admission, onward)   None      PAST MEDICAL HISTORY   Past Medical History:  Diagnosis Date  . Adenomatous colon polyp   . Depression   . Ear drum perforation    left x2   . GERD (gastroesophageal reflux disease)   . Hyperlipidemia   . Hypertension   . T8 vertebral fracture [facility])      SURGICAL HISTORY   Past Surgical History:  Procedure Laterality Date  . CARPAL TUNNEL RELEASE     left hand  . COLONOSCOPY  09/18/2009, 09/15/2014  . COLONOSCOPY WITH PROPOFOL N/A 12/29/2017   Procedure: COLONOSCOPY WITH  PROPOFOL;  Surgeon: Manya Silvas, MD;  Location: [facility] ENDOSCOPY;  Service: Endoscopy;  Laterality: N/A;  . EYE SURGERY    . FRACTURE SURGERY    . HERNIA REPAIR    . LIPOMA EXCISION    . SPINE SURGERY       FAMILY HISTORY   Family History  Problem Relation Age of Onset  . Cancer Mother   . Varicose Veins Mother      SOCIAL HISTORY   Social History   Tobacco Use  . Smoking status: Never Smoker  . Smokeless tobacco: Never Used  Vaping Use  . Vaping Use: Never used  Substance Use Topics  . Alcohol use: Never  . Drug use: Never     MEDICATIONS   Current Medication:  Current Facility-Administered Medications:  .  aspirin chewable tablet 324 mg, 324 mg, Oral, NOW **OR** aspirin suppository 300 mg, 300 mg, Rectal, NOW, [name], Jayce Kainz, MD .  docusate sodium (COLACE) capsule 100 mg, 100 mg, Oral, BID PRN, [name], Nicole Defino, MD .  heparin injection 5,000 Units, 5,000 Units, Subcutaneous, Q8H, Berry Godsey, MD .  polyethylene glycol (MIRALAX / GLYCOLAX) packet 17 g, 17 g, Oral, Daily PRN, Ottie Glazier, MD  Current Outpatient Medications:  .  acetaminophen (TYLENOL) 325 MG tablet, Take 2 tablets (650 mg total) by mouth every 6 (six) hours as needed for fever., Disp: , Rfl:  .  aspirin EC 81 MG tablet, Take 1 tablet (81 mg total) by mouth daily., Disp: 30 tablet, Rfl: 11 .  feeding supplement (ENSURE ENLIVE / ENSURE PLUS) LIQD, Take 237 mLs by mouth 2 (two) times daily between meals., Disp: 237 mL, Rfl: 12 .  metoprolol succinate (TOPROL-XL) 100 MG 24 hr tablet, Take 1 tablet (100 mg total) by mouth daily. Take with or immediately following a meal., Disp: , Rfl:  .  Multiple Vitamin (MULTIVITAMIN WITH MINERALS) TABS tablet, Take 1 tablet by mouth daily., Disp: , Rfl:  .  pantoprazole (PROTONIX) 40 MG tablet, Take 1 tablet (40 mg total) by mouth daily., Disp: , Rfl:  .  sacubitril-valsartan (ENTRESTO) 24-26 MG, Take 1 tablet by mouth 2 (two) times daily., Disp: , Rfl:      ALLERGIES   Divalproex sodium and Valproic acid    REVIEW OF SYSTEMS     Unable to obtain due to unresponsive state  PHYSICAL EXAMINATION   Vital Signs: Temp:  [98.8 F (37.1 C)] 98.8 F (37.1 C) (05/27 1250)  Pulse Rate:  [74-77] 74 (05/27 1250) Resp:  [16-24] 16 (05/27 1250) BP: (125)/(89) 125/89 (05/27 1250) SpO2:  [92 %-94 %] 94 % (05/27 1250) Weight:  [301 kg] 129 kg (05/27 1246)  GENERAL:Age appropriate obese male unresponsive on bipap HEAD: Normocephalic, atraumatic.  EYES: Pupils equal, round, reactive to light.  No scleral icterus.  MOUTH: Moist mucosal membrane. NECK: Supple. No thyromegaly. No nodules. No JVD.  PULMONARY: crackles b/l CARDIOVASCULAR: S1 and S2. Regular rate and rhythm. No murmurs, rubs, or gallops.  GASTROINTESTINAL: Soft, nontender, non-distended. No masses. Positive bowel sounds. No hepatosplenomegaly.  MUSCULOSKELETAL: +edema  NEUROLOGIC: unresponsive  SKIN:intact,warm,dry   PERTINENT DATA     Infusions:  Scheduled Medications: . aspirin  324 mg Oral NOW   Or  . aspirin  300 mg Rectal NOW  . heparin  5,000 Units Subcutaneous Q8H   PRN Medications: docusate sodium, polyethylene glycol Hemodynamic parameters:   Intake/Output: No intake/output data recorded.  Ventilator  Settings:     LAB RESULTS:  Basic Metabolic Panel: No results for input(s): NA, K, CL, CO2, GLUCOSE, BUN, CREATININE, CALCIUM, MG, PHOS in the last 168 hours. Liver Function Tests: No results for input(s): AST, ALT, ALKPHOS, BILITOT, PROT, ALBUMIN in the last 168 hours. No results for input(s): LIPASE, AMYLASE in the last 168 hours. No results for input(s): AMMONIA in the last 168 hours. CBC: Recent Labs  Lab 02/23/21 1246  WBC 18.6*  NEUTROABS 14.9*  HGB 16.9  HCT 52.3*  MCV 100.8*  PLT 179   Cardiac Enzymes: No results for input(s): CKTOTAL, CKMB, CKMBINDEX, TROPONINI in the last 168 hours. BNP: Invalid input(s): POCBNP CBG: No  results for input(s): GLUCAP in the last 168 hours.     IMAGING RESULTS:  Imaging: No results found. '@PROBHOSP' @ No results found.   ASSESSMENT AND PLAN    -Multidisciplinary rounds held today  Acute Hypoxic and hypecapnic Respiratory Failure -present on admission              Due to community acquired pneumonia/viral pna vs Obesity hypoventialtion syndrome vs OSA         -continue BIPAP - repeat ABG ordered. - COVID19 pending  - supplemental O2 during my evaluation -BIPAP 24/6 tv 500-640 - will perform infectious workup for pneumonia -Respiratory viral panel -serum fungitell -legionella ab -strep pneumoniae ur AG -Histoplasma Ur Ag -sputum resp cultures -AFB sputum expectorated specimen -sputum cytology  -reviewed pertinent imaging with patient today - ESR -PT/OT for d/c planning  -please encourage patient to use incentive spirometer few times each hour while hospitalized.      ID -continue IV abx as prescibed -follow up cultures  GI/Nutrition GI PROPHYLAXIS as indicated DIET-->TF's as tolerated Constipation protocol as indicated  ENDO - ICU hypoglycemic\Hyperglycemia protocol -check FSBS per protocol   ELECTROLYTES -follow labs as needed -replace as needed -pharmacy consultation   DVT/GI PRX ordered -SCDs  TRANSFUSIONS AS NEEDED MONITOR FSBS ASSESS the need for LABS as needed   Critical care provider statement:    Critical care time (minutes):  108   Critical care time was exclusive of:  Separately billable procedures and treating other patients   Critical care was necessary to treat or prevent imminent or life-threatening deterioration of the following conditions:  acute hypercanic hypoxemic respiratory failure,  encephalopathy , oHs, OSA, multiple comoribid conditionss   Critical care was time spent personally by me on the following activities:  Development of treatment plan with patient or surrogate, discussions with consultants, evaluation of  patient's response to treatment, examination of patient, obtaining history from patient or surrogate, ordering and performing treatments and interventions, ordering and review of laboratory studies and re-evaluation of patient's condition.  I assumed direction of critical care for this patient from another provider in my specialty: no    This document was prepared using Dragon voice recognition software and may include unintentional dictation errors.    Ottie Glazier, M.D.  [facility] [place]

## 2021-02-23 NOTE — Sepsis Progress Note (Signed)
Elink following sepsis protocol.  

## 2021-02-23 NOTE — ED Triage Notes (Signed)
Patient BIBA from home. Shortness of breath for 3 days. 70% on RA with EMS. Placed on CPAP in arrival to ER. Reports he is suppose to wear CPAP at night but is out of oxygen. Per EMS patient confused at home and in route to ER. Patient A&OX3 on arrival.   Bgl [id number]

## 2021-02-23 NOTE — ED Notes (Signed)
XR at bedside, confirmed ETT and OG, RT at bedside for post intubation ABG

## 2021-02-23 NOTE — Progress Notes (Signed)
RT assisted with patient transport to CT while patient on V60 BiPAP.

## 2021-02-23 NOTE — Progress Notes (Signed)
RT assisted with patient transport to ICU while on the trilogy Ventilator.

## 2021-02-23 NOTE — ED Notes (Signed)
ABG in 2 hours, possible need for intubation.

## 2021-02-23 NOTE — ED Notes (Signed)
Patient placed on Bipap, RT in room on patient arrival to ER.

## 2021-02-23 NOTE — Procedures (Signed)
Intubation Procedure Note  Eddie Chapman  [id number]  [birth year]  Date:02/23/21  Time:4:00 PM   Provider Performing:Millisa Giarrusso D Dewaine Conger    Procedure: Intubation ([address]00)  Indication(s) Respiratory Failure  Consent Risks of the procedure as well as the alternatives and risks of each were explained to the patient and/or caregiver.  Consent for the procedure was obtained and is signed in the bedside chart   Anesthesia Etomidate, Fentanyl and Rocuronium   Time Out Verified patient identification, verified procedure, site/side was marked, verified correct patient position, special equipment/implants available, medications/allergies/relevant history reviewed, required imaging and test results available.   Sterile Technique Usual hand hygeine, masks, and gloves were used   Procedure Description Patient positioned in bed supine.  Sedation given as noted above.  Patient was intubated with endotracheal tube using Glidescope.  View was Grade 1 full glottis .  Number of attempts was 1.  Colorimetric CO2 detector was consistent with tracheal placement.   Complications/Tolerance None; patient tolerated the procedure well. Chest X-ray is ordered to verify placement.   EBL Minimal   Specimen(s) None  Size 8.0 ETT  Secured at 25 cm at lip.   Darel Hong, AGACNP-BC [facility] Prefer epic messenger for cross cover needs If after hours, please call E-link

## 2021-02-23 NOTE — ED Notes (Signed)
MD notified of BP

## 2021-02-23 NOTE — ED Notes (Signed)
Patient transported to and from CT with RN and RT

## 2021-02-23 NOTE — ED Notes (Signed)
Family at bedside now.

## 2021-02-23 NOTE — ED Notes (Signed)
[place] RN aware of assigned bed

## 2021-02-23 NOTE — ED Notes (Signed)
Patient oxygen noted to be 86-88% for several minutes, RT called. Oxygen increased to 60%.

## 2021-02-23 NOTE — ED Provider Notes (Signed)
Vitals:   02/23/21 1635 02/23/21 1645  BP: (!) 60/37 (!) 66/37  Pulse: 69   Resp: (!) 24 (!) 24  Temp:    SpO2: 94%     Patient is currently admitted to the ICU service, but noted patient is hypotensive.  Evaluated the patient and he is on ventilator at present, he does have cyanosis of the lower extremities and nailbeds of the hands.  He does appear to be hypotensive, affirmed with repeat blood pressures that his blood pressure does appear to be about 60 to mid 60 systolic.  I have notify Dr. [name], and after discussing with him I will start the patient on lactated Ringer bolus and peripheral Levophed.  ICU team aware.   ----------------------------------------- 5:05 PM on 02/23/2021 -----------------------------------------  Initiating code sepsis.  Patient has significant leukocytosis, hypotension on multiple reads, now starting on peripheral pressors, poor capillary refill in distal extremities bilateral.  ICU team ordering antibiotics, switching to Precedex off propofol.  I initiated code sepsis  CRITICAL CARE Performed by: Delman Kitten   Total critical care time: 25 minutes  Critical care time was exclusive of separately billable procedures and treating other patients.  Critical care was necessary to treat or prevent imminent or life-threatening deterioration.  Critical care was time spent personally by me on the following activities: development of treatment plan with patient and/or surrogate as well as nursing, discussions with consultants, evaluation of patient's response to treatment, examination of patient, obtaining history from patient or surrogate, ordering and performing treatments and interventions, ordering and review of laboratory studies, ordering and review of radiographic studies, pulse oximetry and re-evaluation of patient's condition.   Dr. Vanetta Mulders aware that code sepsis has been initiated.  He will continue to follow the patient with the patient is on his  service, currently boarding in the ER  Patient's blood pressure did respond well to interventions boluses, and a peripheral pressor.    Delman Kitten, MD 02/24/21 [phone]

## 2021-02-24 ENCOUNTER — Inpatient Hospital Stay: Payer: PPO

## 2021-02-24 ENCOUNTER — Inpatient Hospital Stay
Admit: 2021-02-24 | Discharge: 2021-02-24 | Disposition: A | Discharge status: Home / Self Care | Payer: PPO | Attending: Pulmonary Disease | Admitting: Pulmonary Disease

## 2021-02-24 DIAGNOSIS — J9811 Atelectasis: Secondary | ICD-10-CM | POA: Diagnosis not present

## 2021-02-24 DIAGNOSIS — J9602 Acute respiratory failure with hypercapnia: Secondary | ICD-10-CM | POA: Diagnosis not present

## 2021-02-24 DIAGNOSIS — J9601 Acute respiratory failure with hypoxia: Secondary | ICD-10-CM | POA: Diagnosis not present

## 2021-02-24 DIAGNOSIS — I517 Cardiomegaly: Secondary | ICD-10-CM | POA: Diagnosis not present

## 2021-02-24 LAB — BLOOD GAS, ARTERIAL
Acid-Base Excess: 13.7 mmol/L — ABNORMAL HIGH (ref 0.0–2.0)
Bicarbonate: 37.6 mmol/L — ABNORMAL HIGH (ref 20.0–28.0)
FIO2: 0.6
MECHVT: 550 mL
O2 Saturation: 94.9 %
PEEP: 5 cmH2O
Patient temperature: 37
RATE: 22 resp/min
pCO2 arterial: 42 mmHg (ref 32.0–48.0)
pH, Arterial: 7.56 — ABNORMAL HIGH (ref 7.350–7.450)
pO2, Arterial: 64 mmHg — ABNORMAL LOW (ref 83.0–108.0)

## 2021-02-24 LAB — GLUCOSE, CAPILLARY
Glucose-Capillary: 162 mg/dL — ABNORMAL HIGH (ref 70–99)
Glucose-Capillary: 144 mg/dL — ABNORMAL HIGH (ref 70–99)
Glucose-Capillary: 118 mg/dL — ABNORMAL HIGH (ref 70–99)
Glucose-Capillary: 137 mg/dL — ABNORMAL HIGH (ref 70–99)
Glucose-Capillary: 114 mg/dL — ABNORMAL HIGH (ref 70–99)
Glucose-Capillary: 98 mg/dL (ref 70–99)

## 2021-02-24 LAB — CBC
HCT: 47.4 % (ref 39.0–52.0)
Hemoglobin: 15.8 g/dL (ref 13.0–17.0)
MCH: 32.2 pg (ref 26.0–34.0)
MCHC: 33.3 g/dL (ref 30.0–36.0)
MCV: 96.7 fL (ref 80.0–100.0)
Platelets: 196 10*3/uL (ref 150–400)
RBC: 4.9 MIL/uL (ref 4.22–5.81)
RDW: 14 % (ref 11.5–15.5)
WBC: 18.1 10*3/uL — ABNORMAL HIGH (ref 4.0–10.5)
nRBC: 1.1 % — ABNORMAL HIGH (ref 0.0–0.2)

## 2021-02-24 LAB — BASIC METABOLIC PANEL
Anion gap: 11 (ref 5–15)
BUN: 33 mg/dL — ABNORMAL HIGH (ref 8–23)
CO2: 31 mmol/L (ref 22–32)
Calcium: 8.9 mg/dL (ref 8.9–10.3)
Chloride: 96 mmol/L — ABNORMAL LOW (ref 98–111)
Creatinine, Ser: 1.45 mg/dL — ABNORMAL HIGH (ref 0.61–1.24)
GFR, Estimated: 55 mL/min — ABNORMAL LOW (ref >60)
Glucose, Bld: 147 mg/dL — ABNORMAL HIGH (ref 70–99)
Potassium: 4.3 mmol/L (ref 3.5–5.1)
Sodium: 138 mmol/L (ref 135–145)

## 2021-02-24 LAB — ECHOCARDIOGRAM COMPLETE
Height: 70 in
S' Lateral: 3.28 cm
Weight: 5273.4 oz

## 2021-02-24 LAB — TRIGLYCERIDES: Triglycerides: 237 mg/dL — ABNORMAL HIGH (ref <150)

## 2021-02-24 LAB — PHOSPHORUS: Phosphorus: 2 mg/dL — ABNORMAL LOW (ref 2.5–4.6)

## 2021-02-24 LAB — MAGNESIUM: Magnesium: 2.1 mg/dL (ref 1.7–2.4)

## 2021-02-24 MED ORDER — MIDAZOLAM HCL 2 MG/2ML IJ SOLN
INTRAMUSCULAR | Status: AC
  Administered 2021-02-24: 4 mg via INTRAVENOUS
  Filled 2021-02-24: qty 4

## 2021-02-24 MED ORDER — MIDAZOLAM 50MG/50ML (1MG/ML) PREMIX INFUSION
0.5000 mg/h | INTRAVENOUS | Status: DC
  Administered 2021-02-24: 7 mg/h via INTRAVENOUS
  Administered 2021-02-24: 5 mg/h via INTRAVENOUS
  Administered 2021-02-25: 8 mg/h via INTRAVENOUS
  Filled 2021-02-24 (×3): qty 50

## 2021-02-24 MED ORDER — MIDAZOLAM HCL 2 MG/2ML IJ SOLN
2.0000 mg | INTRAMUSCULAR | Status: DC | PRN
  Administered 2021-02-25 – 2021-03-12 (×28): 2 mg via INTRAVENOUS
  Filled 2021-02-24 (×16): qty 2

## 2021-02-24 MED ORDER — MIDAZOLAM HCL 2 MG/2ML IJ SOLN: 4.0000 mg | Freq: Once | INTRAMUSCULAR | Status: AC

## 2021-02-24 NOTE — Plan of Care (Deleted)
Pt continued on full vent support all vent, unable to wean pt off levo gtt. Pt continued on versed, levo, fentanyl and precedex gtt. CT abdomen today and surgery consulted questionable ileus. OGT to [facility] WITH 850 CC of bile return in the last 12 hrs.  "Surgery came to bedside and stated to continue NGT to [facility] for few days and go from their".  Problem: Clinical Measurements: Goal: Ability to maintain clinical measurements within normal limits will improve Outcome: Not Progressing Goal: Will remain free from infection Outcome: Progressing Goal: Diagnostic test results will improve Outcome: Not Progressing Goal: Respiratory complications will improve Outcome: Not Progressing Goal: Cardiovascular complication will be avoided Outcome: Progressing

## 2021-02-24 NOTE — Progress Notes (Signed)
CRITICAL CARE PROGRESS NOTE    Name: Eddie Chapman MRN: [id number] DOB: 1961/07/18     LOS: 1  Referring physician: Dr Charna Archer  SUBJECTIVE FINDINGS & SIGNIFICANT EVENTS    Patient description:   Patient has a complex comorbid history including systolic CHF chronically, pulmonary hypertension, obstructive sleep apnea, chronic hypoxemia with respiratory failure, obesity hypoventilation syndrome with thoracic restriction, hormonal dysfunction with hypotestosteronism, dyslipidemia, metabolic syndrome, previous subarachnoid hemorrhage, history of traumatic thoracic spinal injury, depression.  Patient is hypoxemic at rest during interview and examination today.  Patient was admitted in January to the intensive care unit with altered mental status acute hypoxemic and hypercapnic respiratory failure with metabolic encephalopathy and left lower lobe severe pneumonia.  Throughout hospitalization he was noted to have lethargy with hypercapnic encephalopathy and became unresponsive a few times even postextubation while on medical floor.  Patient has never smoked in the past.  On this admission he is poorly responsive with hypercapnic hypoxemic respiratory failure    02/24/21- Patient responded well to mechanical ventilation with improvement in hypercapnia on ABG this am.  Seems that he had THC induced apnea/hypopnea based on laboratory testing.  There is bibasilar atelectasis induced severe hypoxemia, recruitment Metaneb therapy has been ordered. Plan to repeat CXR this afternoon with SBT today.  Reviewed plan with RN and RT today.    Lines/tubes :   Microbiology/Sepsis markers: Results for orders placed or performed during the hospital encounter of 02/23/21  Resp Panel by RT-PCR (Flu A&B, Covid) Nasopharyngeal Swab      Status: None   Collection Time: 02/23/21 12:46 PM   Specimen: Nasopharyngeal Swab; Nasopharyngeal(NP) swabs in vial transport medium  Result Value Ref Range Status   SARS Coronavirus 2 by RT PCR NEGATIVE NEGATIVE Final    Comment: (NOTE) SARS-CoV-2 target nucleic acids are NOT DETECTED.  The SARS-CoV-2 RNA is generally detectable in upper respiratory specimens during the acute phase of infection. The lowest concentration of SARS-CoV-2 viral copies this assay can detect is 138 copies/mL. A negative result does not preclude SARS-Cov-2 infection and should not be used as the sole basis for treatment or other patient management decisions. A negative result may occur with  improper specimen collection/handling, submission of specimen other than nasopharyngeal swab, presence of viral mutation(s) within the areas targeted by this assay, and inadequate number of viral copies(<138 copies/mL). A negative result must be combined with clinical observations, patient history, and epidemiological information. The expected result is Negative.  Fact Sheet for Patients:  EntrepreneurPulse.com.au  Fact Sheet for Healthcare Providers:  IncredibleEmployment.be  This test is no t yet approved or cleared by the [place] FDA and  has been authorized for detection and/or diagnosis of SARS-CoV-2 by FDA under an Emergency Use Authorization (EUA). This EUA will remain  in effect (meaning this test can be used) for the duration of the COVID-19 declaration under Section 564(b)(1) of the Act, 21 U.S.C.section 360bbb-3(b)(1), unless the authorization is terminated  or revoked sooner.       Influenza A by PCR NEGATIVE NEGATIVE Final   Influenza B by PCR NEGATIVE NEGATIVE Final    Comment: (NOTE) The Xpert Xpress SARS-CoV-2/FLU/RSV plus assay is intended as an aid in the diagnosis of influenza from Nasopharyngeal swab specimens and should not be used as a sole basis  for treatment. Nasal washings and aspirates are unacceptable for Xpert Xpress SARS-CoV-2/FLU/RSV testing.  Fact Sheet for Patients: EntrepreneurPulse.com.au  Fact Sheet for Healthcare Providers: IncredibleEmployment.be  This test is not yet  approved or cleared by the [place] and has been authorized for detection and/or diagnosis of SARS-CoV-2 by FDA under an Emergency Use Authorization (EUA). This EUA will remain in effect (meaning this test can be used) for the duration of the COVID-19 declaration under Section 564(b)(1) of the Act, 21 U.S.C. section 360bbb-3(b)(1), unless the authorization is terminated or revoked.  Performed at [facility], [address] [place], [place] [address]   Culture, blood (routine x 2)     Status: None (Preliminary result)   Collection Time: 02/23/21 12:46 PM   Specimen: BLOOD  Result Value Ref Range Status   Specimen Description BLOOD RIGHT ARM  Final   Special Requests   Final    BOTTLES DRAWN AEROBIC AND ANAEROBIC Blood Culture results may not be optimal due to an excessive volume of blood received in culture bottles   Culture   Final    NO GROWTH < 12 HOURS Performed at [facility], [address] [place], [place] [address]    Report Status PENDING  Incomplete  Culture, blood (routine x 2)     Status: None (Preliminary result)   Collection Time: 02/23/21  1:20 PM   Specimen: BLOOD  Result Value Ref Range Status   Specimen Description BLOOD BLOOD LEFT HAND  Final   Special Requests   Final    BOTTLES DRAWN AEROBIC AND ANAEROBIC Blood Culture results may not be optimal due to an inadequate volume of blood received in culture bottles   Culture   Final    NO GROWTH < 24 HOURS Performed at [facility], [address] [place], [place] [address]    Report Status PENDING  Incomplete  MRSA PCR Screening     Status: None   Collection Time: 02/23/21  8:02 PM    Specimen: Nasopharyngeal  Result Value Ref Range Status   MRSA by PCR NEGATIVE NEGATIVE Final    Comment:        The GeneXpert MRSA Assay (FDA approved for NASAL specimens only), is one component of a comprehensive MRSA colonization surveillance program. It is not intended to diagnose MRSA infection nor to guide or monitor treatment for MRSA infections. Performed at [facility], [address] [place], [place] [address]     Anti-infectives:  Anti-infectives (From admission, onward)   Start     Dose/Rate Route Frequency Ordered Stop   02/23/21 1800  cefTRIAXone (ROCEPHIN) 2 g in sodium chloride 0.9 % 100 mL IVPB        2 g 200 mL/hr over 30 Minutes Intravenous Every 24 hours 02/23/21 1701     02/23/21 1715  azithromycin (ZITHROMAX) 500 mg in sodium chloride 0.9 % 250 mL IVPB        500 mg 250 mL/hr over 60 Minutes Intravenous Every 24 hours 02/23/21 1701        PAST MEDICAL HISTORY   Past Medical History:  Diagnosis Date  . Adenomatous colon polyp   . Depression   . Ear drum perforation    left x2   . GERD (gastroesophageal reflux disease)   . Hyperlipidemia   . Hypertension   . T8 vertebral fracture [facility])      SURGICAL HISTORY   Past Surgical History:  Procedure Laterality Date  . CARPAL TUNNEL RELEASE     left hand  . COLONOSCOPY  09/18/2009, 09/15/2014  . COLONOSCOPY WITH PROPOFOL N/A 12/29/2017   Procedure: COLONOSCOPY WITH PROPOFOL;  Surgeon: Manya Silvas, MD;  Location: [facility] ENDOSCOPY;  Service: Endoscopy;  Laterality: N/A;  . EYE SURGERY    . FRACTURE SURGERY    . HERNIA REPAIR    . LIPOMA EXCISION    . SPINE SURGERY       FAMILY HISTORY   Family History  Problem Relation Age of Onset  . Cancer Mother   . Varicose Veins Mother      SOCIAL HISTORY   Social History   Tobacco Use  . Smoking status: Never Smoker  . Smokeless tobacco: Never Used  Vaping Use  . Vaping Use: Never used  Substance Use Topics  . Alcohol use:  Never  . Drug use: Never     MEDICATIONS   Current Medication:  Current Facility-Administered Medications:  .  0.9 %  sodium chloride infusion, 250 mL, Intravenous, Continuous, Quale, Mark, MD .  azithromycin (ZITHROMAX) 500 mg in sodium chloride 0.9 % 250 mL IVPB, 500 mg, Intravenous, Q24H, Ottie Glazier, MD, Stopped at 02/23/21 1903 .  cefTRIAXone (ROCEPHIN) 2 g in sodium chloride 0.9 % 100 mL IVPB, 2 g, Intravenous, Q24H, Ottie Glazier, MD, Stopped at 02/23/21 1755 .  chlorhexidine gluconate (MEDLINE KIT) (PERIDEX) 0.12 % solution 15 mL, 15 mL, Mouth Rinse, BID, Rust-Chester, Britton L, NP, 15 mL at 02/24/21 0829 .  Chlorhexidine Gluconate Cloth 2 % PADS 6 each, 6 each, Topical, Daily, Ottie Glazier, MD, 6 each at 02/23/21 1959 .  dexmedetomidine (PRECEDEX) 400 MCG/100ML (4 mcg/mL) infusion, 0.4-1.2 mcg/kg/hr, Intravenous, Titrated, Ottie Glazier, MD, Stopped at 02/24/21 0207 .  docusate (COLACE) 50 MG/5ML liquid 100 mg, 100 mg, Per Tube, BID, Darel Hong D, NP, 100 mg at 02/24/21 0857 .  docusate sodium (COLACE) capsule 100 mg, 100 mg, Oral, BID PRN, Ottie Glazier, MD .  etomidate (AMIDATE) injection 30 mg, 30 mg, Intravenous, Once, Darel Hong D, NP .  fentaNYL (SUBLIMAZE) bolus via infusion 50-100 mcg, 50-100 mcg, Intravenous, Q15 min PRN, Darel Hong D, NP, 50 mcg at 02/23/21 1620 .  fentaNYL (SUBLIMAZE) injection 100 mcg, 100 mcg, Intravenous, Once, Darel Hong D, NP .  fentaNYL 2596mg in NS 2556m(1092mml) infusion-PREMIX, 0-400 mcg/hr, Intravenous, Continuous, Makale Pindell, MD, Last Rate: 2.5 mL/hr at 02/24/21 0745, 25 mcg/hr at 02/24/21 0745 .  heparin injection 5,000 Units, 5,000 Units, Subcutaneous, Q8H, AleOttie GlazierD, 5,000 Units at 02/24/21 0538 .  ipratropium-albuterol (DUONEB) 0.5-2.5 (3) MG/3ML nebulizer solution 3 mL, 3 mL, Nebulization, Q4H PRN, KeeDarel Hong NP .  MEDLINE mouth rinse, 15 mL, Mouth Rinse, 10 times per day,  Rust-Chester, Britton L, NP, 15 mL at 02/24/21 0858 .  norepinephrine (LEVOPHED) 4mg11m 250mL20mmix infusion, 2-10 mcg/min, Intravenous, Titrated, QualeDelman Kitten Last Rate: 18.75 mL/hr at 02/24/21 0745, 5 mcg/min at 02/24/21 0745 .  pantoprazole (PROTONIX) injection 40 mg, 40 mg, Intravenous, Daily, KeeneDarel HongP, 40 mg at 02/24/21 0857 .  polyethylene glycol (MIRALAX / GLYCOLAX) packet 17 g, 17 g, Oral, Daily PRN, AleskLanney Ginsd, MD .  polyethylene glycol (MIRALAX / GLYCOLAX) packet 17 g, 17 g, Per Tube, Daily, KeeneDarel HongP, 17 g at 02/24/21 0857 .  propofol (DIPRIVAN) 1000 MG/100ML infusion, 0-50 mcg/kg/min, Intravenous, Continuous, KeeneDarel HongP, Last Rate: 31 mL/hr at 02/24/21 0828, 40 mcg/kg/min at 02/24/21 0828 .  rocuronium (ZEMURON) injection 50 mg, 50 mg, Intravenous, Once, KeeneDarel HongP    ALLERGIES   Divalproex sodium and Valproic acid    REVIEW OF SYSTEMS     Unable to  obtain due to unresponsive state  PHYSICAL EXAMINATION   Vital Signs: Temp:  [98.06 F (36.7 C)-99.9 F (37.7 C)] 98.42 F (36.9 C) (05/28 0730) Pulse Rate:  [32-104] 48 (05/28 0730) Resp:  [10-32] 22 (05/28 0730) BP: (60-131)/(37-95) 117/73 (05/28 0730) SpO2:  [53 %-100 %] 93 % (05/28 0811) FiO2 (%):  [60 %-100 %] 60 % (05/28 0811) Weight:  [129 kg-149.5 kg] 149.5 kg (05/28 0500)  GENERAL:Age appropriate obese male unresponsive on bipap HEAD: Normocephalic, atraumatic.  EYES: Pupils equal, round, reactive to light.  No scleral icterus.  MOUTH: Moist mucosal membrane. NECK: Supple. No thyromegaly. No nodules. No JVD.  PULMONARY: crackles b/l CARDIOVASCULAR: S1 and S2. Regular rate and rhythm. No murmurs, rubs, or gallops.  GASTROINTESTINAL: Soft, nontender, non-distended. No masses. Positive bowel sounds. No hepatosplenomegaly.  MUSCULOSKELETAL: +edema  NEUROLOGIC: unresponsive  SKIN:intact,warm,dry   PERTINENT DATA     Infusions: . sodium chloride     . azithromycin Stopped (02/23/21 1903)  . cefTRIAXone (ROCEPHIN)  IV Stopped (02/23/21 1755)  . dexmedetomidine (PRECEDEX) IV infusion Stopped (02/24/21 0207)  . fentaNYL infusion INTRAVENOUS 25 mcg/hr (02/24/21 0745)  . norepinephrine (LEVOPHED) Adult infusion 5 mcg/min (02/24/21 0745)  . propofol (DIPRIVAN) infusion 40 mcg/kg/min (02/24/21 0828)   Scheduled Medications: . chlorhexidine gluconate (MEDLINE KIT)  15 mL Mouth Rinse BID  . Chlorhexidine Gluconate Cloth  6 each Topical Daily  . docusate  100 mg Per Tube BID  . etomidate  30 mg Intravenous Once  . fentaNYL (SUBLIMAZE) injection  100 mcg Intravenous Once  . heparin  5,000 Units Subcutaneous Q8H  . mouth rinse  15 mL Mouth Rinse 10 times per day  . pantoprazole (PROTONIX) IV  40 mg Intravenous Daily  . polyethylene glycol  17 g Per Tube Daily  . rocuronium  50 mg Intravenous Once   PRN Medications: docusate sodium, fentaNYL, ipratropium-albuterol, polyethylene glycol Hemodynamic parameters:   Intake/Output: 05/27 0701 - 05/28 0700 In: 1801.9 [I.V.:658.3; IV Piggyback:1143.7] Out: 450 [Urine:450]  Ventilator  Settings: Vent Mode: PRVC FiO2 (%):  [60 %-100 %] 60 % Set Rate:  [15 bmp-24 bmp] 15 bmp Vt Set:  [550 mL] 550 mL PEEP:  [5 cmH20] 5 cmH20 Plateau Pressure:  [18 cmH20-21 cmH20] 18 cmH20   LAB RESULTS:  Basic Metabolic Panel: Recent Labs  Lab 02/23/21 1246 02/24/21 0447  NA 137 138  K 5.1 4.3  CL 93* 96*  CO2 35* 31  GLUCOSE 136* 147*  BUN 26* 33*  CREATININE 1.13 1.45*  CALCIUM 8.7* 8.9  MG  --  2.1  PHOS  --  2.0*   Liver Function Tests: Recent Labs  Lab 02/23/21 1246  AST 30  ALT 20  ALKPHOS 54  BILITOT 1.2  PROT 7.7  ALBUMIN 4.1   No results for input(s): LIPASE, AMYLASE in the last 168 hours. No results for input(s): AMMONIA in the last 168 hours. CBC: Recent Labs  Lab 02/23/21 1246 02/24/21 0447  WBC 18.6* 18.1*  NEUTROABS 14.9*  --   HGB 16.9 15.8  HCT 52.3* 47.4  MCV  100.8* 96.7  PLT 179 196   Cardiac Enzymes: No results for input(s): CKTOTAL, CKMB, CKMBINDEX, TROPONINI in the last 168 hours. BNP: Invalid input(s): POCBNP CBG: Recent Labs  Lab 02/23/21 1901 02/24/21 0357 02/24/21 0719  GLUCAP 188* 162* 144*       IMAGING RESULTS:  Imaging: DG Abd 1 View  Result Date: 02/23/2021 CLINICAL DATA:  60 year old male with NG placement. EXAM: ABDOMEN -  1 VIEW COMPARISON:  Radiograph dated 10/10/2020. FINDINGS: Partially visualized enteric tube with tip and side-port in the body of the stomach. No bowel dilatation. Left pleural effusion and associated atelectasis or infiltrate. IMPRESSION: Enteric tube with tip and side-port in the body of the stomach. Electronically Signed   By: Anner Crete M.D.   On: 02/23/2021 21:04   CT HEAD WO CONTRAST  Result Date: 02/23/2021 CLINICAL DATA:  60 year old male with subdural hemorrhage. EXAM: CT HEAD WITHOUT CONTRAST TECHNIQUE: Contiguous axial images were obtained from the base of the skull through the vertex without intravenous contrast. COMPARISON:  Head CT dated 09/28/2020. FINDINGS: Brain: There is mild age-related atrophy and chronic microvascular ischemic changes. Areas of old infarct and encephalomalacia involving the right frontal and temporal lobes similar to prior CT. Focal old infarct and encephalomalacia in the left temporal lobe. There is no acute intracranial hemorrhage. No mass effect or midline shift no extra-axial fluid collection. Vascular: No hyperdense vessel or unexpected calcification. Skull: Normal. Negative for fracture or focal lesion. Sinuses/Orbits: A 3 cm left maxillary sinus retention cyst or polyp the the visualized paranasal sinuses and mastoid air cells are clear. Other: None IMPRESSION: 1. No acute intracranial pathology. 2. Mild age-related atrophy and chronic microvascular ischemic changes. Old bilateral frontal and temporal lobe infarcts. Electronically Signed   By: Anner Crete  M.D.   On: 02/23/2021 15:32   DG Chest Port 1 View  Result Date: 02/24/2021 CLINICAL DATA:  Edema EXAM: PORTABLE CHEST 1 VIEW COMPARISON:  02/23/2021 FINDINGS: Single frontal view of the chest demonstrates endotracheal tube and enteric catheter unchanged. Cardiac silhouette remains enlarged. Lung volumes are diminished, with crowding the central vasculature. Scattered areas of consolidation at the lung bases are stable, which may reflect atelectasis. No effusion or pneumothorax. No acute bony abnormalities. IMPRESSION: 1. Stable low lung volumes, with areas of bibasilar consolidation likely reflecting atelectasis. Electronically Signed   By: Randa Ngo M.D.   On: 02/24/2021 01:44   DG Chest Portable 1 View  Result Date: 02/23/2021 CLINICAL DATA:  Intubation and OG tube placement. EXAM: PORTABLE CHEST 1 VIEW COMPARISON:  Radiograph earlier today., Chest CT 09/26/2019 FINDINGS: Endotracheal tube tip is approximately 4.3 cm from the carina. Tip and side port of the enteric tube below the diaphragm in the stomach. Persistent low lung volumes. Stable cardiomegaly. Stable left basilar opacity, likely combination of volume loss and epicardial fat pad is seen on prior exam. Interstitial edema with mild improvement. No pneumothorax. IMPRESSION: 1. Endotracheal tube tip approximately 4.3 cm from the carina. Enteric tube in place with tip below the diaphragm in the stomach. 2. Cardiomegaly with improving interstitial edema. 3. Unchanged left basilar opacity, likely combination of volume loss and epicardial fat pad when compared with prior CT. Electronically Signed   By: Keith Rake M.D.   On: 02/23/2021 16:29   DG Chest Portable 1 View  Result Date: 02/23/2021 CLINICAL DATA:  Respiratory distress. EXAM: PORTABLE CHEST 1 VIEW COMPARISON:  Single-view of the chest 10/10/2020 and 09/30/2020. CT chest 09/25/2020. FINDINGS: Lung volumes are low. There is cardiomegaly and interstitial edema. Hazy opacity in the  left lung base is unchanged and most consistent with volume loss and prominent epicardial fat as seen on prior CT. No pneumothorax or pleural fluid. No acute or focal bony abnormality. IMPRESSION: Cardiomegaly and interstitial edema. Electronically Signed   By: Inge Rise M.D.   On: 02/23/2021 13:29   '@PROBHOSP' @ DG Abd 1 View  Result Date: 02/23/2021 CLINICAL DATA:  60 year old male with NG placement. EXAM: ABDOMEN - 1 VIEW COMPARISON:  Radiograph dated 10/10/2020. FINDINGS: Partially visualized enteric tube with tip and side-port in the body of the stomach. No bowel dilatation. Left pleural effusion and associated atelectasis or infiltrate. IMPRESSION: Enteric tube with tip and side-port in the body of the stomach. Electronically Signed   By: Anner Crete M.D.   On: 02/23/2021 21:04   CT HEAD WO CONTRAST  Result Date: 02/23/2021 CLINICAL DATA:  60 year old male with subdural hemorrhage. EXAM: CT HEAD WITHOUT CONTRAST TECHNIQUE: Contiguous axial images were obtained from the base of the skull through the vertex without intravenous contrast. COMPARISON:  Head CT dated 09/28/2020. FINDINGS: Brain: There is mild age-related atrophy and chronic microvascular ischemic changes. Areas of old infarct and encephalomalacia involving the right frontal and temporal lobes similar to prior CT. Focal old infarct and encephalomalacia in the left temporal lobe. There is no acute intracranial hemorrhage. No mass effect or midline shift no extra-axial fluid collection. Vascular: No hyperdense vessel or unexpected calcification. Skull: Normal. Negative for fracture or focal lesion. Sinuses/Orbits: A 3 cm left maxillary sinus retention cyst or polyp the the visualized paranasal sinuses and mastoid air cells are clear. Other: None IMPRESSION: 1. No acute intracranial pathology. 2. Mild age-related atrophy and chronic microvascular ischemic changes. Old bilateral frontal and temporal lobe infarcts. Electronically Signed    By: Anner Crete M.D.   On: 02/23/2021 15:32   DG Chest Port 1 View  Result Date: 02/24/2021 CLINICAL DATA:  Edema EXAM: PORTABLE CHEST 1 VIEW COMPARISON:  02/23/2021 FINDINGS: Single frontal view of the chest demonstrates endotracheal tube and enteric catheter unchanged. Cardiac silhouette remains enlarged. Lung volumes are diminished, with crowding the central vasculature. Scattered areas of consolidation at the lung bases are stable, which may reflect atelectasis. No effusion or pneumothorax. No acute bony abnormalities. IMPRESSION: 1. Stable low lung volumes, with areas of bibasilar consolidation likely reflecting atelectasis. Electronically Signed   By: Randa Ngo M.D.   On: 02/24/2021 01:44   DG Chest Portable 1 View  Result Date: 02/23/2021 CLINICAL DATA:  Intubation and OG tube placement. EXAM: PORTABLE CHEST 1 VIEW COMPARISON:  Radiograph earlier today., Chest CT 09/26/2019 FINDINGS: Endotracheal tube tip is approximately 4.3 cm from the carina. Tip and side port of the enteric tube below the diaphragm in the stomach. Persistent low lung volumes. Stable cardiomegaly. Stable left basilar opacity, likely combination of volume loss and epicardial fat pad is seen on prior exam. Interstitial edema with mild improvement. No pneumothorax. IMPRESSION: 1. Endotracheal tube tip approximately 4.3 cm from the carina. Enteric tube in place with tip below the diaphragm in the stomach. 2. Cardiomegaly with improving interstitial edema. 3. Unchanged left basilar opacity, likely combination of volume loss and epicardial fat pad when compared with prior CT. Electronically Signed   By: Keith Rake M.D.   On: 02/23/2021 16:29   DG Chest Portable 1 View  Result Date: 02/23/2021 CLINICAL DATA:  Respiratory distress. EXAM: PORTABLE CHEST 1 VIEW COMPARISON:  Single-view of the chest 10/10/2020 and 09/30/2020. CT chest 09/25/2020. FINDINGS: Lung volumes are low. There is cardiomegaly and interstitial  edema. Hazy opacity in the left lung base is unchanged and most consistent with volume loss and prominent epicardial fat as seen on prior CT. No pneumothorax or pleural fluid. No acute or focal bony abnormality. IMPRESSION: Cardiomegaly and interstitial edema. Electronically Signed   By: Inge Rise M.D.   On: 02/23/2021 13:29     ASSESSMENT  AND PLAN    -Multidisciplinary rounds held today  Acute Hypoxic and hypecapnic Respiratory Failure -present on admission              Due to community acquired pneumonia/viral pna vs Obesity hypoventialtion syndrome vs OSA         -continue BIPAP - repeat ABG ordered. - COVID19 pending  - supplemental O2 during my evaluation -BIPAP 24/6 tv 500-640 - will perform infectious workup for pneumonia -Respiratory viral panel -serum fungitell -legionella ab -strep pneumoniae ur AG -Histoplasma Ur Ag -sputum resp cultures -AFB sputum expectorated specimen -sputum cytology  -reviewed pertinent imaging with patient today - ESR -PT/OT for d/c planning  -please encourage patient to use incentive spirometer few times each hour while hospitalized.      ID -continue IV abx as prescibed -follow up cultures  GI/Nutrition GI PROPHYLAXIS as indicated DIET-->TF's as tolerated Constipation protocol as indicated  ENDO - ICU hypoglycemic\Hyperglycemia protocol -check FSBS per protocol   ELECTROLYTES -follow labs as needed -replace as needed -pharmacy consultation   DVT/GI PRX ordered -SCDs  TRANSFUSIONS AS NEEDED MONITOR FSBS ASSESS the need for LABS as needed   Critical care provider statement:    Critical care time (minutes):  33   Critical care time was exclusive of:  Separately billable procedures and treating other patients   Critical care was necessary to treat or prevent imminent or life-threatening deterioration of the following conditions:  acute hypercanic hypoxemic respiratory failure,  encephalopathy , oHs, OSA, multiple  comoribid conditionss   Critical care was time spent personally by me on the following activities:  Development of treatment plan with patient or surrogate, discussions with consultants, evaluation of patient's response to treatment, examination of patient, obtaining history from patient or surrogate, ordering and performing treatments and interventions, ordering and review of laboratory studies and re-evaluation of patient's condition.  I assumed direction of critical care for this patient from another provider in my specialty: no    This document was prepared using Dragon voice recognition software and may include unintentional dictation errors.    Ottie Glazier, M.D.  Division of [place]

## 2021-02-24 NOTE — Care Plan (Signed)
Pt continue on fentanyl gtt, precedex gtt, Versed gtt.  Pt very easily agitated with any stimulation , bucking vent most times, Pt was weaned off levo gtt and propofol for now. VSS,  Live in girlfriend at bedside all day and updated.    Problem: Clinical Measurements: Goal: Ability to maintain clinical measurements within normal limits will improve Outcome: Not Progressing Goal: Will remain free from infection Outcome: Progressing Goal: Diagnostic test results will improve Outcome: Progressing Goal: Respiratory complications will improve Outcome: Not Progressing Goal: Cardiovascular complication will be avoided Outcome: Progressing        Revision History

## 2021-02-25 DIAGNOSIS — J9602 Acute respiratory failure with hypercapnia: Secondary | ICD-10-CM | POA: Diagnosis not present

## 2021-02-25 DIAGNOSIS — J9601 Acute respiratory failure with hypoxia: Secondary | ICD-10-CM | POA: Diagnosis not present

## 2021-02-25 LAB — GLUCOSE, CAPILLARY
Glucose-Capillary: 93 mg/dL (ref 70–99)
Glucose-Capillary: 87 mg/dL (ref 70–99)
Glucose-Capillary: 101 mg/dL — ABNORMAL HIGH (ref 70–99)
Glucose-Capillary: 103 mg/dL — ABNORMAL HIGH (ref 70–99)
Glucose-Capillary: 96 mg/dL (ref 70–99)

## 2021-02-25 LAB — CBC
HCT: 46 % (ref 39.0–52.0)
Hemoglobin: 14.9 g/dL (ref 13.0–17.0)
MCH: 32.6 pg (ref 26.0–34.0)
MCHC: 32.4 g/dL (ref 30.0–36.0)
MCV: 100.7 fL — ABNORMAL HIGH (ref 80.0–100.0)
Platelets: 152 10*3/uL (ref 150–400)
RBC: 4.57 MIL/uL (ref 4.22–5.81)
RDW: 15.4 % (ref 11.5–15.5)
WBC: 17.4 10*3/uL — ABNORMAL HIGH (ref 4.0–10.5)
nRBC: 0.1 % (ref 0.0–0.2)

## 2021-02-25 LAB — BASIC METABOLIC PANEL
Anion gap: 12 (ref 5–15)
BUN: 36 mg/dL — ABNORMAL HIGH (ref 8–23)
CO2: 33 mmol/L — ABNORMAL HIGH (ref 22–32)
Calcium: 8.6 mg/dL — ABNORMAL LOW (ref 8.9–10.3)
Chloride: 96 mmol/L — ABNORMAL LOW (ref 98–111)
Creatinine, Ser: 1.47 mg/dL — ABNORMAL HIGH (ref 0.61–1.24)
GFR, Estimated: 54 mL/min — ABNORMAL LOW (ref >60)
Glucose, Bld: 91 mg/dL (ref 70–99)
Potassium: 4.1 mmol/L (ref 3.5–5.1)
Sodium: 141 mmol/L (ref 135–145)

## 2021-02-25 LAB — LEGIONELLA PNEUMOPHILA SEROGP 1 UR AG: L. pneumophila Serogp 1 Ur Ag: NEGATIVE

## 2021-02-25 LAB — URINE CULTURE: Culture: NO GROWTH

## 2021-02-25 LAB — PHOSPHORUS: Phosphorus: 5.3 mg/dL — ABNORMAL HIGH (ref 2.5–4.6)

## 2021-02-25 LAB — MAGNESIUM: Magnesium: 2.5 mg/dL — ABNORMAL HIGH (ref 1.7–2.4)

## 2021-02-25 MED ORDER — VANCOMYCIN HCL 1250 MG/250ML IV SOLN
1250.0000 mg | Freq: Two times a day (BID) | INTRAVENOUS | Status: DC
  Administered 2021-02-26: 1250 mg via INTRAVENOUS
  Filled 2021-02-25 (×2): qty 250

## 2021-02-25 MED ORDER — PIPERACILLIN-TAZOBACTAM 3.375 G IVPB
3.3750 g | Freq: Three times a day (TID) | INTRAVENOUS | Status: AC
  Administered 2021-02-25 – 2021-02-27 (×7): 3.375 g via INTRAVENOUS
  Filled 2021-02-25 (×7): qty 50

## 2021-02-25 MED ORDER — VANCOMYCIN HCL 1500 MG/300ML IV SOLN
1500.0000 mg | Freq: Once | INTRAVENOUS | Status: AC
  Administered 2021-02-25: 1500 mg via INTRAVENOUS
  Filled 2021-02-25: qty 300

## 2021-02-25 MED ORDER — VANCOMYCIN HCL 1000 MG/200ML IV SOLN
1000.0000 mg | Freq: Once | INTRAVENOUS | Status: AC
  Administered 2021-02-25: 1000 mg via INTRAVENOUS
  Filled 2021-02-25 (×2): qty 200

## 2021-02-25 MED ORDER — MIDAZOLAM HCL 50 MG/10ML IJ SOLN
0.5000 mg/h | INTRAVENOUS | Status: DC
  Administered 2021-02-25: 8 mg/h via INTRAVENOUS
  Administered 2021-02-25: 9 mg/h via INTRAVENOUS
  Administered 2021-02-25 – 2021-02-26 (×2): 8 mg/h via INTRAVENOUS
  Administered 2021-02-26: 6 mg/h via INTRAVENOUS
  Administered 2021-02-26 – 2021-02-27 (×3): 8 mg/h via INTRAVENOUS
  Administered 2021-02-27: 7 mg/h via INTRAVENOUS
  Administered 2021-02-28 (×2): 9 mg/h via INTRAVENOUS
  Administered 2021-02-28: 10 mg/h via INTRAVENOUS
  Administered 2021-02-28 (×2): 9 mg/h via INTRAVENOUS
  Administered 2021-03-01 – 2021-03-04 (×13): 10 mg/h via INTRAVENOUS
  Administered 2021-03-04: 11 mg/h via INTRAVENOUS
  Filled 2021-02-25 (×31): qty 10

## 2021-02-25 NOTE — Progress Notes (Signed)
CRITICAL CARE PROGRESS NOTE    Name: Eddie Chapman MRN: [id number] DOB: 01-Aug-1961     LOS: 2  Referring physician: Dr Charna Archer  SUBJECTIVE FINDINGS & SIGNIFICANT EVENTS    Patient description:   Patient has a complex comorbid history including systolic CHF chronically, pulmonary hypertension, obstructive sleep apnea, chronic hypoxemia with respiratory failure, obesity hypoventilation syndrome with thoracic restriction, hormonal dysfunction with hypotestosteronism, dyslipidemia, metabolic syndrome, previous subarachnoid hemorrhage, history of traumatic thoracic spinal injury, depression.  Patient is hypoxemic at rest during interview and examination today.  Patient was admitted in January to the intensive care unit with altered mental status acute hypoxemic and hypercapnic respiratory failure with metabolic encephalopathy and left lower lobe severe pneumonia.  Throughout hospitalization he was noted to have lethargy with hypercapnic encephalopathy and became unresponsive a few times even postextubation while on medical floor.  Patient has never smoked in the past.  On this admission he is poorly responsive with hypercapnic hypoxemic respiratory failure    02/24/21- Patient responded well to mechanical ventilation with improvement in hypercapnia on ABG this am.  Seems that he had THC induced apnea/hypopnea based on laboratory testing.  There is bibasilar atelectasis induced severe hypoxemia, recruitment Metaneb therapy has been ordered. Plan to repeat CXR this afternoon with SBT today.  Reviewed plan with RN and RT today.    02/25/21- patient was unable to pass SBT today. He was able to come off propofol with RASS-0 and is weaned down on levophed.    02/26/21- patient continues to require levophed, he has been  difficult to wean. I have asked ID for consultation due to concern for sepsis possibly due to CAP. Unable to perform SBT today. Family at bedside.   Lines/tubes :   Microbiology/Sepsis markers: Results for orders placed or performed during the hospital encounter of 02/23/21  Resp Panel by RT-PCR (Flu A&B, Covid) Nasopharyngeal Swab     Status: None   Collection Time: 02/23/21 12:46 PM   Specimen: Nasopharyngeal Swab; Nasopharyngeal(NP) swabs in vial transport medium  Result Value Ref Range Status   SARS Coronavirus 2 by RT PCR NEGATIVE NEGATIVE Final    Comment: (NOTE) SARS-CoV-2 target nucleic acids are NOT DETECTED.  The SARS-CoV-2 RNA is generally detectable in upper respiratory specimens during the acute phase of infection. The lowest concentration of SARS-CoV-2 viral copies this assay can detect is 138 copies/mL. A negative result does not preclude SARS-Cov-2 infection and should not be used as the sole basis for treatment or other patient management decisions. A negative result may occur with  improper specimen collection/handling, submission of specimen other than nasopharyngeal swab, presence of viral mutation(s) within the areas targeted by this assay, and inadequate number of viral copies(<138 copies/mL). A negative result must be combined with clinical observations, patient history, and epidemiological information. The expected result is Negative.  Fact Sheet for Patients:  EntrepreneurPulse.com.au  Fact Sheet for Healthcare Providers:  IncredibleEmployment.be  This test is no t yet approved or cleared by the [place] FDA and  has been authorized for detection and/or diagnosis of SARS-CoV-2 by FDA under an Emergency Use Authorization (EUA). This EUA will remain  in effect (meaning this test can be used) for the duration of the COVID-19 declaration under Section 564(b)(1) of the Act, 21 U.S.C.section 360bbb-3(b)(1), unless the  authorization is terminated  or revoked sooner.       Influenza A by PCR NEGATIVE NEGATIVE Final   Influenza B by PCR NEGATIVE NEGATIVE Final  Comment: (NOTE) The Xpert Xpress SARS-CoV-2/FLU/RSV plus assay is intended as an aid in the diagnosis of influenza from Nasopharyngeal swab specimens and should not be used as a sole basis for treatment. Nasal washings and aspirates are unacceptable for Xpert Xpress SARS-CoV-2/FLU/RSV testing.  Fact Sheet for Patients: EntrepreneurPulse.com.au  Fact Sheet for Healthcare Providers: IncredibleEmployment.be  This test is not yet approved or cleared by the [place] FDA and has been authorized for detection and/or diagnosis of SARS-CoV-2 by FDA under an Emergency Use Authorization (EUA). This EUA will remain in effect (meaning this test can be used) for the duration of the COVID-19 declaration under Section 564(b)(1) of the Act, 21 U.S.C. section 360bbb-3(b)(1), unless the authorization is terminated or revoked.  Performed at [facility], [address] [place], [place] [address]   Culture, blood (routine x 2)     Status: None (Preliminary result)   Collection Time: 02/23/21 12:46 PM   Specimen: BLOOD  Result Value Ref Range Status   Specimen Description BLOOD RIGHT ARM  Final   Special Requests   Final    BOTTLES DRAWN AEROBIC AND ANAEROBIC Blood Culture results may not be optimal due to an excessive volume of blood received in culture bottles   Culture   Final    NO GROWTH 2 DAYS Performed at [facility], [address] [place], [place] [address]    Report Status PENDING  Incomplete  Culture, blood (routine x 2)     Status: None (Preliminary result)   Collection Time: 02/23/21  1:20 PM   Specimen: BLOOD  Result Value Ref Range Status   Specimen Description BLOOD BLOOD LEFT HAND  Final   Special Requests   Final    BOTTLES DRAWN AEROBIC AND ANAEROBIC Blood Culture  results may not be optimal due to an inadequate volume of blood received in culture bottles   Culture   Final    NO GROWTH 2 DAYS Performed at [facility], [address], [place], [place] [address]    Report Status PENDING  Incomplete  Urine Culture     Status: None   Collection Time: 02/23/21  1:29 PM   Specimen: Urine, Random  Result Value Ref Range Status   Specimen Description   Final    URINE, RANDOM Performed at [facility], [address] [place], [place] [address]    Special Requests   Final    NONE Performed at [facility], [address] [place], [place] [address]    Culture   Final    NO GROWTH Performed at [facility], [place] [address], [place], [place] [address]    Report Status 02/25/2021 FINAL  Final  MRSA PCR Screening     Status: None   Collection Time: 02/23/21  8:02 PM   Specimen: Nasopharyngeal  Result Value Ref Range Status   MRSA by PCR NEGATIVE NEGATIVE Final    Comment:        The GeneXpert MRSA Assay (FDA approved for NASAL specimens only), is one component of a comprehensive MRSA colonization surveillance program. It is not intended to diagnose MRSA infection nor to guide or monitor treatment for MRSA infections. Performed at [facility], [address] [place],  [address]   Culture, Respiratory w Gram Stain     Status: None (Preliminary result)   Collection Time: 02/24/21 11:15 AM   Specimen: Tracheal Aspirate; Respiratory  Result Value Ref Range Status   Specimen Description  Final    TRACHEAL ASPIRATE Performed at [facility], [address], [place], [place] [address]    Special Requests   Final    NONE Performed at [facility], [address] [place], [place] [address]    Gram Stain PENDING  Incomplete   Culture   Final    CULTURE REINCUBATED FOR BETTER GROWTH Performed at [facility], [place] [address], [place], [place] [address]    Report  Status PENDING  Incomplete    Anti-infectives:  Anti-infectives (From admission, onward)   Start     Dose/Rate Route Frequency Ordered Stop   02/23/21 1800  cefTRIAXone (ROCEPHIN) 2 g in sodium chloride 0.9 % 100 mL IVPB        2 g 200 mL/hr over 30 Minutes Intravenous Every 24 hours 02/23/21 1701     02/23/21 1715  azithromycin (ZITHROMAX) 500 mg in sodium chloride 0.9 % 250 mL IVPB        500 mg 250 mL/hr over 60 Minutes Intravenous Every 24 hours 02/23/21 1701        PAST MEDICAL HISTORY   Past Medical History:  Diagnosis Date  . Adenomatous colon polyp   . Depression   . Ear drum perforation    left x2   . GERD (gastroesophageal reflux disease)   . Hyperlipidemia   . Hypertension   . T8 vertebral fracture [facility])      SURGICAL HISTORY   Past Surgical History:  Procedure Laterality Date  . CARPAL TUNNEL RELEASE     left hand  . COLONOSCOPY  09/18/2009, 09/15/2014  . COLONOSCOPY WITH PROPOFOL N/A 12/29/2017   Procedure: COLONOSCOPY WITH PROPOFOL;  Surgeon: Manya Silvas, MD;  Location: [facility] ENDOSCOPY;  Service: Endoscopy;  Laterality: N/A;  . EYE SURGERY    . FRACTURE SURGERY    . HERNIA REPAIR    . LIPOMA EXCISION    . SPINE SURGERY       FAMILY HISTORY   Family History  Problem Relation Age of Onset  . Cancer Mother   . Varicose Veins Mother      SOCIAL HISTORY   Social History   Tobacco Use  . Smoking status: Never Smoker  . Smokeless tobacco: Never Used  Vaping Use  . Vaping Use: Never used  Substance Use Topics  . Alcohol use: Never  . Drug use: Never     MEDICATIONS   Current Medication:  Current Facility-Administered Medications:  .  0.9 %  sodium chloride infusion, 250 mL, Intravenous, Continuous, Quale, Mark, MD, Last Rate: 10 mL/hr at 02/25/21 1534, 250 mL at 02/25/21 1534 .  azithromycin (ZITHROMAX) 500 mg in sodium chloride 0.9 % 250 mL IVPB, 500 mg, Intravenous, Q24H, Ottie Glazier, MD, Stopped at 02/24/21 1717 .   cefTRIAXone (ROCEPHIN) 2 g in sodium chloride 0.9 % 100 mL IVPB, 2 g, Intravenous, Q24H, Ottie Glazier, MD, Stopped at 02/24/21 1811 .  chlorhexidine gluconate (MEDLINE KIT) (PERIDEX) 0.12 % solution 15 mL, 15 mL, Mouth Rinse, BID, Rust-Chester, Britton L, NP, 15 mL at 02/25/21 0737 .  Chlorhexidine Gluconate Cloth 2 % PADS 6 each, 6 each, Topical, Daily, Ottie Glazier, MD, 6 each at 02/25/21 [phone] .  docusate (COLACE) 50 MG/5ML liquid 100 mg, 100 mg, Per Tube, BID, Darel Hong D, NP, 100 mg at 02/25/21 0958 .  docusate sodium (COLACE) capsule 100 mg, 100 mg, Oral, BID PRN, [name], Olive Zmuda, MD .  etomidate (AMIDATE) injection 30 mg, 30 mg, Intravenous,  Once, Darel Hong D, NP .  fentaNYL (SUBLIMAZE) bolus via infusion 50-100 mcg, 50-100 mcg, Intravenous, Q15 min PRN, Darel Hong D, NP, 50 mcg at 02/23/21 1620 .  fentaNYL (SUBLIMAZE) injection 100 mcg, 100 mcg, Intravenous, Once, Darel Hong D, NP .  fentaNYL 253mg in NS 2589m(1084mml) infusion-PREMIX, 0-400 mcg/hr, Intravenous, Continuous, Collan Schoenfeld, MD, Last Rate: 27.5 mL/hr at 02/25/21 1500, 275 mcg/hr at 02/25/21 1500 .  heparin injection 5,000 Units, 5,000 Units, Subcutaneous, Q8H, AleOttie GlazierD, 5,000 Units at 02/25/21 1335 .  ipratropium-albuterol (DUONEB) 0.5-2.5 (3) MG/3ML nebulizer solution 3 mL, 3 mL, Nebulization, Q4H PRN, KeeDarel Hong NP .  MEDLINE mouth rinse, 15 mL, Mouth Rinse, 10 times per day, Rust-Chester, Britton L, NP, 15 mL at 02/25/21 1532 .  midazolam (VERSED) 50 mg in dextrose 5 % 50 mL (1 mg/mL) infusion, 0.5-10 mg/hr, Intravenous, Continuous, Nate Common, MD, Last Rate: 8 mL/hr at 02/25/21 1500, 8 mg/hr at 02/25/21 1500 .  midazolam (VERSED) injection 2 mg, 2 mg, Intravenous, Q2H PRN, Rust-Chester, Britton L, NP, 2 mg at 02/25/21 1249 .  norepinephrine (LEVOPHED) 4mg71m 250mL35mmix infusion, 2-10 mcg/min, Intravenous, Titrated, QualeDelman Kitten Last Rate: 15 mL/hr at 02/25/21 1500,  4 mcg/min at 02/25/21 1500 .  pantoprazole (PROTONIX) injection 40 mg, 40 mg, Intravenous, Daily, KeeneDarel HongP, 40 mg at 02/25/21 0958 .  polyethylene glycol (MIRALAX / GLYCOLAX) packet 17 g, 17 g, Oral, Daily PRN, AleskLanney Ginsd, MD .  polyethylene glycol (MIRALAX / GLYCOLAX) packet 17 g, 17 g, Per Tube, Daily, KeeneDarel HongP, 17 g at 02/25/21 0958 .  propofol (DIPRIVAN) 1000 MG/100ML infusion, 0-50 mcg/kg/min, Intravenous, Continuous, KeeneBradly Bienenstock Stopped at 02/24/21 2112 .  rocuronium (ZEMURON) injection 50 mg, 50 mg, Intravenous, Once, KeeneDarel HongP    ALLERGIES   Divalproex sodium and Valproic acid    REVIEW OF SYSTEMS     Unable to obtain due to unresponsive state  PHYSICAL EXAMINATION   Vital Signs: Temp:  [97.34 F (36.3 C)-99.14 F (37.3 C)] 98.78 F (37.1 C) (05/29 1530) Pulse Rate:  [57-94] 79 (05/29 1530) Resp:  [13-21] 17 (05/29 1530) BP: (72-126)/(50-84) 114/84 (05/29 1530) SpO2:  [91 %-100 %] 97 % (05/29 1530) FiO2 (%):  [40 %-55 %] 40 % (05/29 1452) Weight:  [146.7 kg] 146.7 kg (05/29 0500)  GENERAL:Age appropriate obese male sedated on MV HEAD: Normocephalic, atraumatic.  EYES: Pupils equal, round, reactive to light.  No scleral icterus.  MOUTH: Moist mucosal membrane. NECK: Supple. No thyromegaly. No nodules. No JVD.  PULMONARY: crackles b/l CARDIOVASCULAR: S1 and S2. Regular rate and rhythm. No murmurs, rubs, or gallops.  GASTROINTESTINAL: Soft obese, nontender, non-distended. No masses. Positive bowel sounds. No hepatosplenomegaly.  MUSCULOSKELETAL: +edema  NEUROLOGIC: unresponsive GCS4T SKIN:intact,warm,dry   PERTINENT DATA     Infusions: . sodium chloride 250 mL (02/25/21 1534)  . azithromycin Stopped (02/24/21 1717)  . cefTRIAXone (ROCEPHIN)  IV Stopped (02/24/21 1811)  . fentaNYL infusion INTRAVENOUS 275 mcg/hr (02/25/21 1500)  . midazolam 8 mg/hr (02/25/21 1500)  . norepinephrine (LEVOPHED) Adult  infusion 4 mcg/min (02/25/21 1500)  . propofol (DIPRIVAN) infusion Stopped (02/24/21 2112)   Scheduled Medications: . chlorhexidine gluconate (MEDLINE KIT)  15 mL Mouth Rinse BID  . Chlorhexidine Gluconate Cloth  6 each Topical Daily  . docusate  100 mg Per Tube BID  . etomidate  30 mg Intravenous Once  . fentaNYL (SUBLIMAZE) injection  100 mcg Intravenous Once  .  heparin  5,000 Units Subcutaneous Q8H  . mouth rinse  15 mL Mouth Rinse 10 times per day  . pantoprazole (PROTONIX) IV  40 mg Intravenous Daily  . polyethylene glycol  17 g Per Tube Daily  . rocuronium  50 mg Intravenous Once   PRN Medications: docusate sodium, fentaNYL, ipratropium-albuterol, midazolam, polyethylene glycol Hemodynamic parameters:   Intake/Output: 05/28 0701 - 05/29 0700 In: 3339.3 [P.O.:1600; I.V.:1369.2; IV Piggyback:370] Out: 1080 [Urine:1050; Emesis/NG output:30]  Ventilator  Settings: Vent Mode: PRVC FiO2 (%):  [40 %-55 %] 40 % Set Rate:  [15 bmp] 15 bmp Vt Set:  [550 mL] 550 mL PEEP:  [5 cmH20] 5 cmH20 Plateau Pressure:  [24 cmH20] 24 cmH20   LAB RESULTS:  Basic Metabolic Panel: Recent Labs  Lab 02/23/21 1246 02/24/21 0447 02/25/21 0407  NA 137 138 141  K 5.1 4.3 4.1  CL 93* 96* 96*  CO2 35* 31 33*  GLUCOSE 136* 147* 91  BUN 26* 33* 36*  CREATININE 1.13 1.45* 1.47*  CALCIUM 8.7* 8.9 8.6*  MG  --  2.1 2.5*  PHOS  --  2.0* 5.3*   Liver Function Tests: Recent Labs  Lab 02/23/21 1246  AST 30  ALT 20  ALKPHOS 54  BILITOT 1.2  PROT 7.7  ALBUMIN 4.1   No results for input(s): LIPASE, AMYLASE in the last 168 hours. No results for input(s): AMMONIA in the last 168 hours. CBC: Recent Labs  Lab 02/23/21 1246 02/24/21 0447 02/25/21 0407  WBC 18.6* 18.1* 17.4*  NEUTROABS 14.9*  --   --   HGB 16.9 15.8 14.9  HCT 52.3* 47.4 46.0  MCV 100.8* 96.7 100.7*  PLT 179 196 152   Cardiac Enzymes: No results for input(s): CKTOTAL, CKMB, CKMBINDEX, TROPONINI in the last 168  hours. BNP: Invalid input(s): POCBNP CBG: Recent Labs  Lab 02/24/21 2322 02/25/21 0356 02/25/21 0730 02/25/21 1123 02/25/21 1529  GLUCAP 98 93 87 101* 103*       IMAGING RESULTS:  Imaging: DG Abd 1 View  Result Date: 02/23/2021 CLINICAL DATA:  60 year old male with NG placement. EXAM: ABDOMEN - 1 VIEW COMPARISON:  Radiograph dated 10/10/2020. FINDINGS: Partially visualized enteric tube with tip and side-port in the body of the stomach. No bowel dilatation. Left pleural effusion and associated atelectasis or infiltrate. IMPRESSION: Enteric tube with tip and side-port in the body of the stomach. Electronically Signed   By: Anner Crete M.D.   On: 02/23/2021 21:04   DG Chest Port 1 View  Result Date: 02/24/2021 CLINICAL DATA:  Intubated EXAM: PORTABLE CHEST 1 VIEW COMPARISON:  02/24/2021 at 0131 hours FINDINGS: Endotracheal tube terminates 5 cm above the carina. Low lung volumes. No frank interstitial edema. Mild bilateral lower lobe opacities, likely atelectasis. Suspected small left pleural effusion. No pneumothorax. Cardiomegaly. Enteric tube courses into the stomach. IMPRESSION: Endotracheal tube terminates 5 cm above the carina. Mild bilateral lower lobe opacities, likely atelectasis. Suspected small left pleural effusion. No frank interstitial edema. Electronically Signed   By: Julian Hy M.D.   On: 02/24/2021 09:52   DG Chest Port 1 View  Result Date: 02/24/2021 CLINICAL DATA:  Edema EXAM: PORTABLE CHEST 1 VIEW COMPARISON:  02/23/2021 FINDINGS: Single frontal view of the chest demonstrates endotracheal tube and enteric catheter unchanged. Cardiac silhouette remains enlarged. Lung volumes are diminished, with crowding the central vasculature. Scattered areas of consolidation at the lung bases are stable, which may reflect atelectasis. No effusion or pneumothorax. No acute bony abnormalities. IMPRESSION: 1. Stable  low lung volumes, with areas of bibasilar consolidation  likely reflecting atelectasis. Electronically Signed   By: Randa Ngo M.D.   On: 02/24/2021 01:44   ECHOCARDIOGRAM COMPLETE  Result Date: 02/24/2021    ECHOCARDIOGRAM REPORT   Patient Name:   DEONDREA MARKOS Date of Exam: 02/24/2021 Medical Rec #:  [id number]     Height:       70.0 in Accession #:    [id number]    Weight:       329.6 lb Date of Birth:  [birth year]      BSA:          2.581 m Patient Age:    60 years      BP:           117/73 mmHg Patient Gender: M             HR:           63 bpm. Exam Location:  [facility] Procedure: 2D Echo Indications:     CHF I50.21  History:         Patient has prior history of Echocardiogram examinations, most                  recent 09/22/2020.  Sonographer:     Arville Go RDCS Referring Phys:  [id number] Ottie Glazier Diagnosing Phys: Isaias Cowman MD  Sonographer Comments: Technically challenging study due to limited acoustic windows. Image acquisition challenging due to patient body habitus. IMPRESSIONS  1. Left ventricular ejection fraction, by estimation, is 60 to 65%. The left ventricle has normal function. The left ventricle has no regional wall motion abnormalities. There is mild left ventricular hypertrophy. Left ventricular diastolic parameters are indeterminate.  2. Right ventricular systolic function is normal. The right ventricular size is normal.  3. The mitral valve is normal in structure. Mild mitral valve regurgitation. No evidence of mitral stenosis.  4. The aortic valve is normal in structure. Aortic valve regurgitation is not visualized. No aortic stenosis is present.  5. The inferior vena cava is normal in size with greater than 50% respiratory variability, suggesting right atrial pressure of 3 mmHg. FINDINGS  Left Ventricle: Left ventricular ejection fraction, by estimation, is 60 to 65%. The left ventricle has normal function. The left ventricle has no regional wall motion abnormalities. The left ventricular internal cavity size was normal in size.  There is  mild left ventricular hypertrophy. Left ventricular diastolic parameters are indeterminate. Right Ventricle: The right ventricular size is normal. No increase in right ventricular wall thickness. Right ventricular systolic function is normal. Left Atrium: Left atrial size was normal in size. Right Atrium: Right atrial size was normal in size. Pericardium: There is no evidence of pericardial effusion. Mitral Valve: The mitral valve is normal in structure. Mild mitral valve regurgitation. No evidence of mitral valve stenosis. Tricuspid Valve: The tricuspid valve is normal in structure. Tricuspid valve regurgitation is mild . No evidence of tricuspid stenosis. Aortic Valve: The aortic valve is normal in structure. Aortic valve regurgitation is not visualized. No aortic stenosis is present. Pulmonic Valve: The pulmonic valve was normal in structure. Pulmonic valve regurgitation is not visualized. No evidence of pulmonic stenosis. Aorta: The aortic root is normal in size and structure. Venous: The inferior vena cava is normal in size with greater than 50% respiratory variability, suggesting right atrial pressure of 3 mmHg. IAS/Shunts: No atrial level shunt detected by color flow Doppler.  LEFT VENTRICLE PLAX 2D LVIDd:  5.20 cm LVIDs:         3.28 cm LV PW:         1.46 cm LV IVS:        1.50 cm LVOT diam:     1.90 cm LVOT Area:     2.84 cm  LEFT ATRIUM         Index LA diam:    3.80 cm 1.47 cm/m                        PULMONIC VALVE AORTA                 PV Vmax:       1.18 m/s Ao Root diam: 3.90 cm PV Peak grad:  5.6 mmHg Ao Asc diam:  3.90 cm  TRICUSPID VALVE TV Peak grad:   36.6 mmHg TV Vmax:        3.03 m/s  SHUNTS Systemic Diam: 1.90 cm Isaias Cowman MD Electronically signed by Isaias Cowman MD Signature Date/Time: 02/24/2021/1:29:35 PM    Final    _0 @ No results found.   ASSESSMENT AND PLAN    -Multidisciplinary rounds held today  Acute Hypoxic and hypecapnic  Respiratory Failure -present on admission              Due to community acquired pneumonia/viral pna vs Obesity hypoventialtion syndrome vs OSA         -continue BIPAP - repeat ABG ordered. - COVID19 /RSV/FLUa&b negative - supplemental O2 during my evaluation -PRVC 40% - will perform infectious workup for pneumonia -Respiratory viral panel-in process -legionella ab-negative -strep pneumoniae ur AG-negative -sputum resp cultures-in process -reviewed pertinent imaging with patients family today - Ddimer    Septic Shock   - present on admission    - possible Left sided pneumonia   - patient drug screen abnormal suspect possible aspiration PNA post drug use vs Infectiuos Pneumonia as evidenced by Left lung infiltrate.    - MRSA PCR is negative   - patient on levophed   - LR bolus today 250cc/h    - oliguric    - IV zosyn   - ID consult -Unclear regarding sepsis    ID -continue IV abx as prescibed -follow up cultures  GI/Nutrition GI PROPHYLAXIS as indicated DIET-->TF's as tolerated Constipation protocol as indicated  ENDO - ICU hypoglycemic\Hyperglycemia protocol -check FSBS per protocol   ELECTROLYTES -follow labs as needed -replace as needed -pharmacy consultation   DVT/GI PRX ordered -SCDs  TRANSFUSIONS AS NEEDED MONITOR FSBS ASSESS the need for LABS as needed   Critical care provider statement:    Critical care time (minutes):  33   Critical care time was exclusive of:  Separately billable procedures and treating other patients   Critical care was necessary to treat or prevent imminent or life-threatening deterioration of the following conditions:  acute hypercanic hypoxemic respiratory failure,  encephalopathy , oHs, OSA, multiple comoribid conditionss   Critical care was time spent personally by me on the following activities:  Development of treatment plan with patient or surrogate, discussions with consultants, evaluation of patient's response to  treatment, examination of patient, obtaining history from patient or surrogate, ordering and performing treatments and interventions, ordering and review of laboratory studies and re-evaluation of patient's condition.  I assumed direction of critical care for this patient from another provider in my specialty: no    This document was prepared using Dragon voice recognition software and may include unintentional  dictation errors.    Ottie Glazier, M.D.  Division of [facility]

## 2021-02-25 NOTE — Consult Note (Signed)
Pharmacy Antibiotic Note  DASANI CREAR is a 60 y.o. male admitted on 02/23/2021 with pneumonia and sepsis.  Pharmacy has been consulted for Vancomycin & Zosyn dosing.  Plan:  Vancomycin 2500mg  loading dose ;then 1250mg  q12h - predicted AUC 502; Cmax 30.6; Cmin 15 (Vd 0.5; AdjBW;Scr 1.47)  Zosyn 3.375g q8h  Height: 5\' 10"  (177.8 cm) Weight: (!) 146.7 kg (323 lb 6.6 oz) IBW/kg (Calculated) : 73  Temp (24hrs), Avg:98.7 F (37.1 C), Min:97.34 F (36.3 C), Max:99.14 F (37.3 C)  Recent Labs  Lab 02/23/21 1246 02/23/21 1446 02/24/21 0447 02/25/21 0407  WBC 18.6*  --  18.1* 17.4*  CREATININE 1.13  --  1.45* 1.47*  LATICACIDVEN 1.5 1.7  --   --     Estimated Creatinine Clearance: 76.5 mL/min (A) (by C-G formula based on SCr of 1.47 mg/dL (H)).    Allergies  Allergen Reactions  . Divalproex Sodium Other (See Comments)    Elevated liver enzymes  . Valproic Acid     unknown    Antimicrobials this admission: CTX + Azith  (5/27-29); Vanc + Zos (5/29>>  Dose adjustments this admission: N/A; will CTM  Microbiology results: 5/27 BCx: NGTD 5/27 UCx: NGTD  5/28 Sputum: NGTD 5/29 Sputum: sent/pending  5/27 MRSA PCR: Negative 5/27 Covid/Flu - negative Thank you for allowing pharmacy to be a part of this patient's care.  Lorna Dibble 02/25/2021 5:16 PM

## 2021-02-26 ENCOUNTER — Other Ambulatory Visit: Payer: Self-pay

## 2021-02-26 DIAGNOSIS — J9622 Acute and chronic respiratory failure with hypercapnia: Secondary | ICD-10-CM

## 2021-02-26 DIAGNOSIS — R4182 Altered mental status, unspecified: Secondary | ICD-10-CM | POA: Diagnosis not present

## 2021-02-26 DIAGNOSIS — J9602 Acute respiratory failure with hypercapnia: Secondary | ICD-10-CM | POA: Diagnosis not present

## 2021-02-26 DIAGNOSIS — J9601 Acute respiratory failure with hypoxia: Secondary | ICD-10-CM | POA: Diagnosis not present

## 2021-02-26 LAB — RESPIRATORY PANEL BY PCR

## 2021-02-26 LAB — BASIC METABOLIC PANEL
Anion gap: 9 (ref 5–15)
BUN: 27 mg/dL — ABNORMAL HIGH (ref 8–23)
CO2: 32 mmol/L (ref 22–32)
Calcium: 8.4 mg/dL — ABNORMAL LOW (ref 8.9–10.3)
Chloride: 98 mmol/L (ref 98–111)
Creatinine, Ser: 1.3 mg/dL — ABNORMAL HIGH (ref 0.61–1.24)
GFR, Estimated: >60 mL/min (ref >60)
Glucose, Bld: 89 mg/dL (ref 70–99)
Potassium: 5 mmol/L (ref 3.5–5.1)
Sodium: 139 mmol/L (ref 135–145)

## 2021-02-26 LAB — MAGNESIUM: Magnesium: 2.4 mg/dL (ref 1.7–2.4)

## 2021-02-26 LAB — GLUCOSE, CAPILLARY
Glucose-Capillary: 105 mg/dL — ABNORMAL HIGH (ref 70–99)
Glucose-Capillary: 122 mg/dL — ABNORMAL HIGH (ref 70–99)
Glucose-Capillary: 65 mg/dL — ABNORMAL LOW (ref 70–99)
Glucose-Capillary: 66 mg/dL — ABNORMAL LOW (ref 70–99)
Glucose-Capillary: 71 mg/dL (ref 70–99)
Glucose-Capillary: 79 mg/dL (ref 70–99)
Glucose-Capillary: 86 mg/dL (ref 70–99)
Glucose-Capillary: 96 mg/dL (ref 70–99)

## 2021-02-26 LAB — CBC
HCT: 44.3 % (ref 39.0–52.0)
Hemoglobin: 14.1 g/dL (ref 13.0–17.0)
MCH: 32.6 pg (ref 26.0–34.0)
MCHC: 31.8 g/dL (ref 30.0–36.0)
MCV: 102.5 fL — ABNORMAL HIGH (ref 80.0–100.0)
Platelets: 137 10*3/uL — ABNORMAL LOW (ref 150–400)
RBC: 4.32 MIL/uL (ref 4.22–5.81)
RDW: 15.1 % (ref 11.5–15.5)
WBC: 12 10*3/uL — ABNORMAL HIGH (ref 4.0–10.5)
nRBC: 0 % (ref 0.0–0.2)

## 2021-02-26 LAB — D-DIMER, QUANTITATIVE: D-Dimer, Quant: 0.74 ug/mL-FEU — ABNORMAL HIGH (ref 0.00–0.50)

## 2021-02-26 LAB — PHOSPHORUS: Phosphorus: 4.3 mg/dL (ref 2.5–4.6)

## 2021-02-26 LAB — PROCALCITONIN: Procalcitonin: 0.19 ng/mL

## 2021-02-26 LAB — TRIGLYCERIDES: Triglycerides: 223 mg/dL — ABNORMAL HIGH (ref <150)

## 2021-02-26 MED ORDER — VITAL HIGH PROTEIN PO LIQD
1000.0000 mL | ORAL | Status: AC
  Administered 2021-02-26 – 2021-03-06 (×12): 1000 mL

## 2021-02-26 MED ORDER — LACTATED RINGERS IV BOLUS
1000.0000 mL | Freq: Once | INTRAVENOUS | Status: AC
  Administered 2021-02-26: 1000 mL via INTRAVENOUS

## 2021-02-26 MED ORDER — PROSOURCE TF PO LIQD
45.0000 mL | Freq: Every day | ORAL | Status: DC
  Administered 2021-02-26 – 2021-03-07 (×10): 45 mL
  Filled 2021-02-26 (×10): qty 45

## 2021-02-26 MED ORDER — CHLORHEXIDINE GLUCONATE 0.12 % MT SOLN
OROMUCOSAL | Status: AC
  Administered 2021-02-26: 15 mL via OROMUCOSAL
  Filled 2021-02-26: qty 15

## 2021-02-26 MED ORDER — VITAL HIGH PROTEIN PO LIQD: 1000.0000 mL | ORAL | Status: DC

## 2021-02-26 MED ORDER — FREE WATER
30.0000 mL | Status: DC
  Administered 2021-02-26 – 2021-03-07 (×55): 30 mL

## 2021-02-26 MED ORDER — VANCOMYCIN HCL 2000 MG/400ML IV SOLN
2000.0000 mg | INTRAVENOUS | Status: DC
  Filled 2021-02-26: qty 400

## 2021-02-26 MED ORDER — VITAL HIGH PROTEIN PO LIQD
1000.0000 mL | ORAL | Status: DC
  Administered 2021-02-26: 1000 mL

## 2021-02-26 MED ORDER — DEXTROSE 50 % IV SOLN
12.5000 g | INTRAVENOUS | Status: AC
  Administered 2021-02-26: 12.5 g via INTRAVENOUS

## 2021-02-26 MED ORDER — DEXTROSE 50 % IV SOLN
INTRAVENOUS | Status: AC
  Filled 2021-02-26: qty 50

## 2021-02-26 NOTE — Progress Notes (Signed)
Initial Nutrition Assessment  DOCUMENTATION CODES:  Morbid obesity  INTERVENTION:   Adjust tube feeding rate via OGT:   Vital High Protein at 70 ml/h (1680 ml per day), Prosource TF 45 ml 1x/d  Free water flush - 52mL q4h  Provides 1720 kcal, 158 gm protein, 1585 ml free water daily (TF+free water)  NUTRITION DIAGNOSIS:  Increased nutrient needs related to acute illness as evidenced by estimated needs.  GOAL:  Provide needs based on ASPEN/SCCM guidelines  MONITOR:  TF tolerance,I & O's,Vent status,Labs,Weight trends  REASON FOR ASSESSMENT:  Consult Enteral/tube feeding initiation and management  ASSESSMENT:  Pt presented to the ED complaining of shortness of breath worsening for several days. When EMS arrived he was found to have O2 sats in the 70s on room air. Required intubation while in ED due to respiratory failure and admitted to ICU 5/27. PMH of hypertension, hyperlipidemia, PVD, CHF, GERD, and OSA. Admitted earlier this year (12/23-1/21) and required intubation.   Pt intubated and sedated at the time of visit. Nursing staff performing care at the time of assessment. Trickle feeds of Vital High Protein infusing, will adjust rate to better meet nutrition needs.     5/27 - Admitted to ICU, intubated   +OGT - confirmed with XR 5/27  Patient is currently intubated on ventilator support MV: 10 L/min Temp (24hrs), Avg:99 F (37.2 C), Min:98.42 F (36.9 C), Max:99.5 F (37.5 C)   Intake/Output Summary (Last 24 hours) at 02/26/2021 0901 Last data filed at 02/26/2021 0703 Gross per 24 hour  Intake 1779.86 ml  Output 1900 ml  Net -120.14 ml   Net IO Since Admission: 3,392.83 mL [02/26/21 0901]  Nutritionally Relevant Medications Scheduled Meds: . docusate  100 mg Per Tube BID  . pantoprazole (PROTONIX) IV  40 mg Intravenous Daily  . polyethylene glycol  17 g Per Tube Daily   Continuous Infusions: . fentaNYL infusion INTRAVENOUS 275 mcg/hr (02/26/21 0703)  .  norepinephrine (LEVOPHED) Adult infusion 2 mcg/min (02/26/21 0703)  . piperacillin-tazobactam (ZOSYN)  IV 12.5 mL/hr at 02/26/21 0703  . propofol (DIPRIVAN) infusion Stopped (02/25/21 1854)  . vancomycin 1,250 mg (02/26/21 0814)   PRN Meds:.docusate sodium, polyethylene glycol  Labs reviewed:   BUN 27, creatinine 1.3  Triglycerides 223  SBG ranges from 65-103 mg/dL over the last 24 hours  NUTRITION - FOCUSED PHYSICAL EXAM: Defer to follow-up assessment, nursing staff adjust pt in bed and performing care  Diet Order:   Diet Order            Diet NPO time specified  Diet effective now                EDUCATION NEEDS:  No education needs have been identified at this time  Skin:  Skin Assessment: Reviewed RN Assessment  Last BM:  unknown, PTA  Height:  Ht Readings from Last 1 Encounters:  02/25/21 5\' 10"  (1.778 m)   Weight:  Wt Readings from Last 1 Encounters:  02/26/21 (!) 146.3 kg    Ideal Body Weight:  75.5 kg  BMI:  Body mass index is 46.28 kg/m.  Estimated Nutritional Needs:   Kcal:  1600-2000 kcal/d (using ASPEN critical care guidelines (11-14))  Protein:  151-189 g/d (2-2.5g/kg IBW)  Fluid:  1.6-1.8 L/d   Ranell Patrick, RD, LDN Clinical Dietitian Pager on Amion

## 2021-02-26 NOTE — Progress Notes (Signed)
Pt off sedation briefly this shift, he can follow simple motor commands and opens his eyes on command, but could not encouraged to put forth enough resp effort to adequately oxygenate. Foley removed per order and male primofit placed.  Pt then had episode of gross incontinence of bladder.  Condom cath placed after patient was cleaned up.

## 2021-02-26 NOTE — Consult Note (Signed)
NAME: Eddie Chapman  DOB: [birth year]  MRN: [id number]  Date/Time: 02/26/2021 12:37 PM  REQUESTING PROVIDER: [name] Subjective:  REASON FOR CONSULT: ? sepsis ?No history from patient Eddie Chapman is a 60 y.o. male with a history of hypertension, hyperlipidemia, CHF, obesity hypoventilation syndrome, OSA, history of cerebral aneurysm, peripheral vascular disease, pulmonary hypertension, presented to the ED brought in by EMS on 02/23/2021 with shortness of breath of 3 days duration and when EMS arrived he was found to have oxygen sats in the 70s on room air.  He was placed on CPAP and given albuterol.  He became altered while on transport to the hospital.  While in the ED he was responding to questions.  He has been noncompliant with the CPAP. In the ED BP 115/73, temperature 98.7, heart rate 51, respirate 22.  Sats 93% WBC 18.6, hemoglobin 16.9, HCT 42.3, platelet 179, creatinine 1.13 which later increased to 1.45.  Arterial ABG showed a PCO2 of 106, PO2 76, pH 7.23. SARS-CoV-2 was negative Influenza negative. CT head done on 02/23/2021 without contrast was negative for acute intracranial pathology.  There was mild age-related atrophy and chronic microvascular ischemic changes.  Old bilateral frontal and temporal lobe infarcts were noted.  Area of encephalomalacia involving the right frontal and temporal lobes were seen.  Focal infarct and encephalomalacia in the left temporal lobe was also present. Set of blood culture sent on 02/23/2021 was negative. Chest x-ray showed decreased lung volumes.  There was cardiomegaly and interstitial edema. He has been intubated. Has a urethral catheter placed on 02/23/2021.  Past Medical History:  Diagnosis Date  . Adenomatous colon polyp   . Depression   . Ear drum perforation    left x2   . GERD (gastroesophageal reflux disease)   . Hyperlipidemia   . Hypertension   . T8 vertebral fracture [facility])     Past Surgical History:  Procedure Laterality Date  .  CARPAL TUNNEL RELEASE     left hand  . COLONOSCOPY  09/18/2009, 09/15/2014  . COLONOSCOPY WITH PROPOFOL N/A 12/29/2017   Procedure: COLONOSCOPY WITH PROPOFOL;  Surgeon: Manya Silvas, MD;  Location: [facility] ENDOSCOPY;  Service: Endoscopy;  Laterality: N/A;  . EYE SURGERY    . FRACTURE SURGERY    . HERNIA REPAIR    . LIPOMA EXCISION    . SPINE SURGERY      Social History   Socioeconomic History  . Marital status: Single    Spouse name: Not on file  . Number of children: Not on file  . Years of education: Not on file  . Highest education level: Not on file  Occupational History  . Not on file  Tobacco Use  . Smoking status: Never Smoker  . Smokeless tobacco: Never Used  Vaping Use  . Vaping Use: Never used  Substance and Sexual Activity  . Alcohol use: Never  . Drug use: Never  . Sexual activity: Not Currently    Partners: Female    Birth control/protection: None  Other Topics Concern  . Not on file  Social History Narrative  . Not on file   Social Determinants of Health   Financial Resource Strain: Not on file  Food Insecurity: Not on file  Transportation Needs: Not on file  Physical Activity: Not on file  Stress: Not on file  Social Connections: Not on file  Intimate Partner Violence: Not on file    Family History  Problem Relation Age of Onset  . Cancer Mother   .  Varicose Veins Mother    Allergies  Allergen Reactions  . Divalproex Sodium Other (See Comments)    Elevated liver enzymes  . Valproic Acid     unknown   I? Current Facility-Administered Medications  Medication Dose Route Frequency Provider Last Rate Last Admin  . 0.9 %  sodium chloride infusion  250 mL Intravenous Continuous Delman Kitten, MD   Stopped at 02/26/21 [phone]  . chlorhexidine gluconate (MEDLINE KIT) (PERIDEX) 0.12 % solution 15 mL  15 mL Mouth Rinse BID Rust-Chester, Britton L, NP   15 mL at 02/26/21 0812  . Chlorhexidine Gluconate Cloth 2 % PADS 6 each  6 each Topical Daily  Ottie Glazier, MD   6 each at 02/26/21 0600  . docusate (COLACE) 50 MG/5ML liquid 100 mg  100 mg Per Tube BID Darel Hong D, NP   100 mg at 02/26/21 8016  . docusate sodium (COLACE) capsule 100 mg  100 mg Oral BID PRN Ottie Glazier, MD      . etomidate (AMIDATE) injection 30 mg  30 mg Intravenous Once Darel Hong D, NP      . feeding supplement (PROSource TF) liquid 45 mL  45 mL Per Tube Daily Ottie Glazier, MD   45 mL at 02/26/21 1213  . feeding supplement (VITAL HIGH PROTEIN) liquid 1,000 mL  1,000 mL Per Tube Q24H Aleskerov, Fuad, MD   1,000 mL at 02/26/21 1019  . fentaNYL (SUBLIMAZE) bolus via infusion 50-100 mcg  50-100 mcg Intravenous Q15 min PRN Bradly Bienenstock, NP   50 mcg at 02/23/21 1620  . fentaNYL (SUBLIMAZE) injection 100 mcg  100 mcg Intravenous Once Darel Hong D, NP      . fentaNYL 2552mg in NS 2546m(1052mml) infusion-PREMIX  0-400 mcg/hr Intravenous Continuous AleOttie GlazierD 27.5 mL/hr at 02/26/21 1000 275 mcg/hr at 02/26/21 1000  . free water 30 mL  30 mL Per Tube Q4H Aleskerov, Fuad, MD   30 mL at 02/26/21 1200  . heparin injection 5,000 Units  5,000 Units Subcutaneous Q8H AleOttie GlazierD   5,000 Units at 02/26/21 0538  . ipratropium-albuterol (DUONEB) 0.5-2.5 (3) MG/3ML nebulizer solution 3 mL  3 mL Nebulization Q4H PRN KeeDarel Hong NP      . MEDLINE mouth rinse  15 mL Mouth Rinse 10 times per day Rust-Chester, Britton L, NP   15 mL at 02/26/21 1213  . midazolam (VERSED) 50 mg in dextrose 5 % 50 mL (1 mg/mL) infusion  0.5-10 mg/hr Intravenous Continuous AleOttie GlazierD 8 mL/hr at 02/26/21 1000 8 mg/hr at 02/26/21 1000  . midazolam (VERSED) injection 2 mg  2 mg Intravenous Q2H PRN Rust-Chester, BriHuel CoteP   2 mg at 02/25/21 1820  . norepinephrine (LEVOPHED) 4mg71m 250mL64mmix infusion  2-10 mcg/min Intravenous Titrated QualeDelman Kitten11.25 mL/hr at 02/26/21 1000 3 mcg/min at 02/26/21 1000  . pantoprazole (PROTONIX) injection 40 mg  40  mg Intravenous Daily KeeneDarel HongP   40 mg at 02/26/21 0815  . piperacillin-tazobactam (ZOSYN) IVPB 3.375 g  3.375 g Intravenous Q8H AleskOttie Glazier  Stopped at 02/26/21 0812 [phone] glycol (MIRALAX / GLYCOLAX) packet 17 g  17 g Oral Daily PRN AleskOttie Glazier     . polyethylene glycol (MIRALAX / GLYCOLAX) packet 17 g  17 g Per Tube Daily KeeneDarel HongP   17 g at 02/26/21 0812 4827START ON 02/27/2021] vancomycin (VANCOREADY) IVPB 2000 mg/400 mL  2,000 mg Intravenous Q24H [name], RPH         Abtx:  Anti-infectives (From admission, onward)   Start     Dose/Rate Route Frequency Ordered Stop   02/27/21 0000  vancomycin (VANCOREADY) IVPB 2000 mg/400 mL        2,000 mg 200 mL/hr over 120 Minutes Intravenous Every 24 hours 02/26/21 1224     02/26/21 0800  vancomycin (VANCOREADY) IVPB 1250 mg/250 mL  Status:  Discontinued        1,250 mg 166.7 mL/hr over 90 Minutes Intravenous Every 12 hours 02/25/21 1730 02/26/21 1224   02/25/21 1830  vancomycin (VANCOREADY) IVPB 1500 mg/300 mL       "Followed by" Linked Group Details   1,500 mg 150 mL/hr over 120 Minutes Intravenous  Once 02/25/21 1730 02/25/21 2339   02/25/21 1830  piperacillin-tazobactam (ZOSYN) IVPB 3.375 g        3.375 g 12.5 mL/hr over 240 Minutes Intravenous Every 8 hours 02/25/21 1730     02/25/21 1815  vancomycin (VANCOREADY) IVPB 1000 mg/200 mL       "Followed by" Linked Group Details   1,000 mg 200 mL/hr over 60 Minutes Intravenous  Once 02/25/21 1730 02/25/21 2040   02/23/21 1800  cefTRIAXone (ROCEPHIN) 2 g in sodium chloride 0.9 % 100 mL IVPB  Status:  Discontinued        2 g 200 mL/hr over 30 Minutes Intravenous Every 24 hours 02/23/21 1701 02/25/21 1730   02/23/21 1715  azithromycin (ZITHROMAX) 500 mg in sodium chloride 0.9 % 250 mL IVPB  Status:  Discontinued        500 mg 250 mL/hr over 60 Minutes Intravenous Every 24 hours 02/23/21 1701 02/25/21 1730      REVIEW OF SYSTEMS:   NA Objective:  VITALS:  BP (!) 84/53   Pulse 75   Temp 98.96 F (37.2 C)   Resp 15   Ht _0  (1.778 m)   Wt (!) 146.3 kg   SpO2 92%   BMI 46.28 kg/m  PHYSICAL EXAM:  General:intubated , sedated, morbid obesity Head: Normocephalic, without obvious abnormality, atraumatic. Eyes: Conjunctivae clear, anicteric sclerae. Pupils are equal ENT cannot assess Neck: Supple, Lungs: b/l air entry- decreased bases Heart:TAchycardia Abdomen: Soft,  Extremities: atraumatic, no cyanosis. No edema. No clubbing Skin: No rashes or lesions. Or bruising Lymph: Cervical, supraclavicular normal. Neurologic: cannot assess Pertinent Labs Lab Results CBC    Component Value Date/Time   WBC 12.0 (H) 02/26/2021 0337   RBC 4.32 02/26/2021 0337   HGB 14.1 02/26/2021 0337   HGB 15.4 05/05/2014 0519   HCT 44.3 02/26/2021 0337   HCT 44.2 05/05/2014 0519   PLT 137 (L) 02/26/2021 0337   PLT 164 05/05/2014 0519   MCV 102.5 (H) 02/26/2021 0337   MCV 93 05/05/2014 0519   MCH 32.6 02/26/2021 0337   MCHC 31.8 02/26/2021 0337   RDW 15.1 02/26/2021 0337   RDW 12.0 05/05/2014 0519   LYMPHSABS 1.4 02/23/2021 1246   LYMPHSABS 2.0 05/05/2014 0519   MONOABS 2.1 (H) 02/23/2021 1246   MONOABS 1.0 05/05/2014 0519   EOSABS 0.0 02/23/2021 1246   EOSABS 0.1 05/05/2014 0519   BASOSABS 0.1 02/23/2021 1246   BASOSABS 0.0 05/05/2014 0519    CMP Latest Ref Rng & Units 02/26/2021 02/25/2021 02/24/2021  Glucose 70 - 99 mg/dL 89 91 147(H)  BUN 8 - 23 mg/dL 27(H) 36(H) 33(H)  Creatinine 0.61 - 1.24 mg/dL 1.30(H) 1.47(H) 1.45(H)  Sodium 135 - 145 mmol/L 139 141 138  Potassium 3.5 - 5.1 mmol/L 5.0 4.1 4.3  Chloride 98 - 111 mmol/L 98 96(L) 96(L)  CO2 22 - 32 mmol/L 32 33(H) 31  Calcium 8.9 - 10.3 mg/dL 8.4(L) 8.6(L) 8.9  Total Protein 6.5 - 8.1 g/dL - - -  Total Bilirubin 0.3 - 1.2 mg/dL - - -  Alkaline Phos 38 - 126 U/L - - -  AST 15 - 41 U/L - - -  ALT 0 - 44 U/L - - -      Microbiology: Recent Results  (from the past 240 hour(s))  Resp Panel by RT-PCR (Flu A&B, Covid) Nasopharyngeal Swab     Status: None   Collection Time: 02/23/21 12:46 PM   Specimen: Nasopharyngeal Swab; Nasopharyngeal(NP) swabs in vial transport medium  Result Value Ref Range Status   SARS Coronavirus 2 by RT PCR NEGATIVE NEGATIVE Final    Comment: (NOTE) SARS-CoV-2 target nucleic acids are NOT DETECTED.  The SARS-CoV-2 RNA is generally detectable in upper respiratory specimens during the acute phase of infection. The lowest concentration of SARS-CoV-2 viral copies this assay can detect is 138 copies/mL. A negative result does not preclude SARS-Cov-2 infection and should not be used as the sole basis for treatment or other patient management decisions. A negative result may occur with  improper specimen collection/handling, submission of specimen other than nasopharyngeal swab, presence of viral mutation(s) within the areas targeted by this assay, and inadequate number of viral copies(<138 copies/mL). A negative result must be combined with clinical observations, patient history, and epidemiological information. The expected result is Negative.  Fact Sheet for Patients:  EntrepreneurPulse.com.au  Fact Sheet for Healthcare Providers:  IncredibleEmployment.be  This test is no t yet approved or cleared by the [place] FDA and  has been authorized for detection and/or diagnosis of SARS-CoV-2 by FDA under an Emergency Use Authorization (EUA). This EUA will remain  in effect (meaning this test can be used) for the duration of the COVID-19 declaration under Section 564(b)(1) of the Act, 21 U.S.C.section 360bbb-3(b)(1), unless the authorization is terminated  or revoked sooner.       Influenza A by PCR NEGATIVE NEGATIVE Final   Influenza B by PCR NEGATIVE NEGATIVE Final    Comment: (NOTE) The Xpert Xpress SARS-CoV-2/FLU/RSV plus assay is intended as an aid in the  diagnosis of influenza from Nasopharyngeal swab specimens and should not be used as a sole basis for treatment. Nasal washings and aspirates are unacceptable for Xpert Xpress SARS-CoV-2/FLU/RSV testing.  Fact Sheet for Patients: EntrepreneurPulse.com.au  Fact Sheet for Healthcare Providers: IncredibleEmployment.be  This test is not yet approved or cleared by the [place] FDA and has been authorized for detection and/or diagnosis of SARS-CoV-2 by FDA under an Emergency Use Authorization (EUA). This EUA will remain in effect (meaning this test can be used) for the duration of the COVID-19 declaration under Section 564(b)(1) of the Act, 21 U.S.C. section 360bbb-3(b)(1), unless the authorization is terminated or revoked.  Performed at [facility], [address] [place], [place] [address]   Culture, blood (routine x 2)     Status: None (Preliminary result)   Collection Time: 02/23/21 12:46 PM   Specimen: BLOOD  Result Value Ref Range Status   Specimen Description BLOOD RIGHT ARM  Final   Special Requests   Final    BOTTLES DRAWN AEROBIC AND ANAEROBIC Blood Culture results may not be optimal due to an excessive volume of blood received  in culture bottles   Culture   Final    NO GROWTH 3 DAYS Performed at [facility], [address] [place], [place] [address]    Report Status PENDING  Incomplete  Culture, blood (routine x 2)     Status: None (Preliminary result)   Collection Time: 02/23/21  1:20 PM   Specimen: BLOOD  Result Value Ref Range Status   Specimen Description BLOOD BLOOD LEFT HAND  Final   Special Requests   Final    BOTTLES DRAWN AEROBIC AND ANAEROBIC Blood Culture results may not be optimal due to an inadequate volume of blood received in culture bottles   Culture   Final    NO GROWTH 3 DAYS Performed at [facility], [address], [place], [place] [address]    Report Status PENDING   Incomplete  Urine Culture     Status: None   Collection Time: 02/23/21  1:29 PM   Specimen: Urine, Random  Result Value Ref Range Status   Specimen Description   Final    URINE, RANDOM Performed at [facility], [address], [place], [place] [address]    Special Requests   Final    NONE Performed at [facility], [address] [place], [place] [address]    Culture   Final    NO GROWTH Performed at [facility], [place] [address], [place], [place] [address]    Report Status 02/25/2021 FINAL  Final  MRSA PCR Screening     Status: None   Collection Time: 02/23/21  8:02 PM   Specimen: Nasopharyngeal  Result Value Ref Range Status   MRSA by PCR NEGATIVE NEGATIVE Final    Comment:        The GeneXpert MRSA Assay (FDA approved for NASAL specimens only), is one component of a comprehensive MRSA colonization surveillance program. It is not intended to diagnose MRSA infection nor to guide or monitor treatment for MRSA infections. Performed at [facility], [address] [place], [place] [address]   Culture, Respiratory w Gram Stain     Status: None (Preliminary result)   Collection Time: 02/24/21 11:15 AM   Specimen: Tracheal Aspirate; Respiratory  Result Value Ref Range Status   Specimen Description   Final    TRACHEAL ASPIRATE Performed at [facility], [address], [place], [place] [address]    Special Requests   Final    NONE Performed at [facility], [address] [place], [place] [address]    Gram Stain   Final    MODERATE WBC PRESENT,BOTH PMN AND MONONUCLEAR RARE SQUAMOUS EPITHELIAL CELLS PRESENT FEW GRAM POSITIVE COCCI FEW GRAM POSITIVE RODS    Culture   Final    Normal respiratory flora-no Staph aureus or Pseudomonas seen Performed at [facility] [address] [place], [place] [address]    Report Status PENDING  Incomplete  Culture, Respiratory w Gram Stain     Status: None  (Preliminary result)   Collection Time: 02/25/21  4:53 PM   Specimen: Tracheal Aspirate; Respiratory  Result Value Ref Range Status   Specimen Description   Final    TRACHEAL ASPIRATE Performed at [facility], [address], [place], [place] [address]    Special Requests   Final    NONE Performed at [facility], [address] [place], [place] [address]    Gram Stain PENDING  Incomplete   Culture   Final  TOO YOUNG TO READ Performed at [facility], [address], [place], [place] [address]    Report Status PENDING  Incomplete    IMAGING RESULTS:  I have personally reviewed the films ? Impression/Recommendation ? ?Acute on chronic hypoxic/hypercarbic respiratory failure needing intubation. Obesity hypoventilation syndrome Obstructive sleep apnea ? Sepsis He is currently on vancomycin and Zosyn. We will send a procalcitonin.  Await resp culture. No other source of infection identified As MRSA nares is neg DC vancomycin?  Hypotension on pressor- check cortisol  AKI-  Pt has foley catheter- change to external catheter ___________________________________________________ Discussed with  requesting provider Note:  This document was prepared using Dragon voice recognition software and may include unintentional dictation errors.

## 2021-02-26 NOTE — Consult Note (Signed)
Pharmacy Antibiotic Note  Eddie Chapman is a 60 y.o. male admitted on 02/23/2021 with pneumonia and sepsis.  Pharmacy has been consulted for vancomycin & Zosyn dosing. His renal function has improved compared to yesterday but is not yet back to his apparent baseline level.  Plan:  1) adjust vancomycin dose to 2000 IV every 24 hours  Ke: 0.056 h-1  T1/2: 12.5 h  Css (calculated): 37.2 / 11.0 mcg/mL Goal AUC 400-550 Expected AUC: 493  SCr used: 1.30 mg/dL  Daily renal function assessment while on IV vancomycin  2) continue Zosyn 3.375 grams IV every 8 hours EI  Follow renal function for potential needed adjustments  Height: 5\' 10"  (177.8 cm) Weight: (!) 146.3 kg (322 lb 8.5 oz) IBW/kg (Calculated) : 73  Temp (24hrs), Avg:99 F (37.2 C), Min:98.42 F (36.9 C), Max:99.5 F (37.5 C)  Recent Labs  Lab 02/23/21 1246 02/23/21 1446 02/24/21 0447 02/25/21 0407 02/26/21 0337  WBC 18.6*  --  18.1* 17.4* 12.0*  CREATININE 1.13  --  1.45* 1.47* 1.30*  LATICACIDVEN 1.5 1.7  --   --   --     Estimated Creatinine Clearance: 86.3 mL/min (A) (by C-G formula based on SCr of 1.3 mg/dL (H)).    Allergies  Allergen Reactions  . Divalproex Sodium Other (See Comments)    Elevated liver enzymes  . Valproic Acid     unknown    Antimicrobials this admission: ceftriaxone + azithromycin  (5/27-29) vancomycin + Zosyn (5/29>>  Microbiology results: 5/27 BCx: NGTD 5/27 UCx: NGTD  5/28 Sputum: few GPC, GPR (pending) 5/29 Sputum: sent/pending  5/27 MRSA PCR: Negative 5/27 Covid/Flu - negative   Thank you for allowing pharmacy to be a part of this patient's care.  Eddie Chapman 02/26/2021 10:53 AM

## 2021-02-27 DIAGNOSIS — J9622 Acute and chronic respiratory failure with hypercapnia: Secondary | ICD-10-CM | POA: Diagnosis not present

## 2021-02-27 DIAGNOSIS — R4182 Altered mental status, unspecified: Secondary | ICD-10-CM | POA: Diagnosis not present

## 2021-02-27 LAB — PHOSPHORUS: Phosphorus: 3.8 mg/dL (ref 2.5–4.6)

## 2021-02-27 LAB — GLUCOSE, CAPILLARY
Glucose-Capillary: 126 mg/dL — ABNORMAL HIGH (ref 70–99)
Glucose-Capillary: 130 mg/dL — ABNORMAL HIGH (ref 70–99)
Glucose-Capillary: 131 mg/dL — ABNORMAL HIGH (ref 70–99)
Glucose-Capillary: 134 mg/dL — ABNORMAL HIGH (ref 70–99)
Glucose-Capillary: 135 mg/dL — ABNORMAL HIGH (ref 70–99)
Glucose-Capillary: 140 mg/dL — ABNORMAL HIGH (ref 70–99)
Glucose-Capillary: 146 mg/dL — ABNORMAL HIGH (ref 70–99)

## 2021-02-27 LAB — CBC
HCT: 42.6 % (ref 39.0–52.0)
Hemoglobin: 13.7 g/dL (ref 13.0–17.0)
MCH: 32.5 pg (ref 26.0–34.0)
MCHC: 32.2 g/dL (ref 30.0–36.0)
MCV: 101.2 fL — ABNORMAL HIGH (ref 80.0–100.0)
Platelets: 145 10*3/uL — ABNORMAL LOW (ref 150–400)
RBC: 4.21 MIL/uL — ABNORMAL LOW (ref 4.22–5.81)
RDW: 14.8 % (ref 11.5–15.5)
WBC: 10.7 10*3/uL — ABNORMAL HIGH (ref 4.0–10.5)
nRBC: 0 % (ref 0.0–0.2)

## 2021-02-27 LAB — BASIC METABOLIC PANEL
Anion gap: 8 (ref 5–15)
BUN: 25 mg/dL — ABNORMAL HIGH (ref 8–23)
CO2: 33 mmol/L — ABNORMAL HIGH (ref 22–32)
Calcium: 8.5 mg/dL — ABNORMAL LOW (ref 8.9–10.3)
Chloride: 97 mmol/L — ABNORMAL LOW (ref 98–111)
Creatinine, Ser: 1.05 mg/dL (ref 0.61–1.24)
GFR, Estimated: >60 mL/min (ref >60)
Glucose, Bld: 130 mg/dL — ABNORMAL HIGH (ref 70–99)
Potassium: 4.4 mmol/L (ref 3.5–5.1)
Sodium: 138 mmol/L (ref 135–145)

## 2021-02-27 LAB — MAGNESIUM: Magnesium: 2.4 mg/dL (ref 1.7–2.4)

## 2021-02-27 LAB — PROCALCITONIN: Procalcitonin: 0.13 ng/mL

## 2021-02-27 LAB — CORTISOL-AM, BLOOD: Cortisol - AM: 10.8 ug/dL (ref 6.7–22.6)

## 2021-02-27 LAB — TRIGLYCERIDES: Triglycerides: 166 mg/dL — ABNORMAL HIGH (ref <150)

## 2021-02-27 MED ORDER — STERILE WATER FOR INJECTION IJ SOLN
INTRAMUSCULAR | Status: AC
  Administered 2021-02-27: 10 mL
  Filled 2021-02-27: qty 10

## 2021-02-27 MED ORDER — VECURONIUM BROMIDE 10 MG IV SOLR
INTRAVENOUS | Status: AC
  Administered 2021-02-27: 10 mg via INTRAVENOUS
  Filled 2021-02-27: qty 10

## 2021-02-27 MED ORDER — VECURONIUM BROMIDE 10 MG IV SOLR: 10.0000 mg | Freq: Once | INTRAVENOUS | Status: AC

## 2021-02-27 NOTE — Plan of Care (Addendum)
Pt turned Q2H this shift, peri care and pads changed PRN, good UOP although not measureable d/t incontinence. Sedation off from 1120-1410.  Pt able to tolerate PS/CPAP for short periods.  He follows commands, nod/shakes head and shrugs shoulders appropriately to questions.  Writing RN asked him "Do you want to live?"  He shook his head "no."  His partner Noma was in the room at the time.  I asked if patient takes antidepressant at home and she believes he does; she will bring the bottle tomorrow.  I looked under "Home Meds" and "Care Everywgere" but I can only find Trazadone PRN for sleep, no other behavioral meds.   [name] is his HCPOA as indicated by the AD the patient signed when he was admitted in January.  Document in on file under "Chart Review," "Media."  When we tried PS/CPAP the last time pt did not appear to want to put much effort into breathing.  He actually seemed to be trying to hold his breath despite encouragement from [place].  His RR, HR, and BP increased sharply at this point while his O2 dropped into the 70s with a good pleth.  Sedation and PRVC restarted, pt required several interventions and a good bit of time before he recovered.   Eventually levophed had to be restarted for hypotension.   Very loud hyperactive BS at end of shift.

## 2021-02-27 NOTE — Plan of Care (Signed)
Discussed in front of patient plan of care for the evening, pain management and bowel movements with no evidence of learning at this time.  Significant other called to check up on the patient.  Problem: Health Behavior/Discharge Planning: Goal: Ability to manage health-related needs will improve Outcome: Progressing

## 2021-02-27 NOTE — Progress Notes (Signed)
Date of Admission:  02/23/2021      ID: Eddie Chapman is a 60 y.o. male  Active Problems:   Respiratory failure with hypercapnia (HCC)    Subjective: status quo  Medications:  . chlorhexidine gluconate (MEDLINE KIT)  15 mL Mouth Rinse BID  . Chlorhexidine Gluconate Cloth  6 each Topical Daily  . docusate  100 mg Per Tube BID  . etomidate  30 mg Intravenous Once  . feeding supplement (PROSource TF)  45 mL Per Tube Daily  . feeding supplement (VITAL HIGH PROTEIN)  1,000 mL Per Tube Q24H  . fentaNYL (SUBLIMAZE) injection  100 mcg Intravenous Once  . free water  30 mL Per Tube Q4H  . heparin  5,000 Units Subcutaneous Q8H  . mouth rinse  15 mL Mouth Rinse 10 times per day  . pantoprazole (PROTONIX) IV  40 mg Intravenous Daily  . polyethylene glycol  17 g Per Tube Daily  . sterile water (preservative free)      . vecuronium      . vecuronium  10 mg Intravenous Once    Objective: Vital signs in last 24 hours: Patient Vitals for the past 24 hrs:  BP Temp Temp src Pulse Resp SpO2 Weight  02/27/21 1200 124/64 98.6 F (37 C) Esophageal 91 15 97 % --  02/27/21 1120 -- 98.24 F (36.8 C) -- 97 (!) 21 96 % --  02/27/21 1100 124/81 98.06 F (36.7 C) -- 95 15 97 % --  02/27/21 1000 (!) 95/59 98.24 F (36.8 C) -- 80 15 94 % --  02/27/21 0900 109/77 98.24 F (36.8 C) -- 94 15 97 % --  02/27/21 0800 106/67 97.88 F (36.6 C) Esophageal 93 (!) 26 95 % --  02/27/21 0700 (!) 93/53 98.06 F (36.7 C) -- 64 15 94 % --  02/27/21 0600 (!) 88/52 98.42 F (36.9 C) -- 74 15 94 % --  02/27/21 0500 132/81 98.24 F (36.8 C) -- 75 15 96 % (!) 151.2 kg  02/27/21 0400 116/77 98.8 F (37.1 C) Esophageal 81 15 97 % --  02/27/21 0300 93/61 99.1 F (37.3 C) -- 80 15 96 % --  02/27/21 0200 111/80 99.1 F (37.3 C) -- 98 15 90 % --  02/27/21 0100 121/82 99.1 F (37.3 C) -- -- -- -- --  02/27/21 0000 (!) 80/53 98.6 F (37 C) Esophageal 94 -- 94 % --  02/26/21 2300 127/84 98.6 F (37 C) -- -- --  -- --  02/26/21 2200 110/69 98.2 F (36.8 C) -- (!) 101 -- 94 % --  02/26/21 2100 127/82 98.42 F (36.9 C) -- (!) 106 -- 94 % --  02/26/21 2000 120/68 98.24 F (36.8 C) Esophageal 92 15 97 % --  02/26/21 1939 -- -- -- -- -- 98 % --  02/26/21 1900 (!) 77/49 98.1 F (36.7 C) -- 94 15 96 % --  02/26/21 1800 108/78 98.42 F (36.9 C) Esophageal (!) 107 (!) 26 92 % --  02/26/21 1700 123/67 98.96 F (37.2 C) -- (!) 111 (!) 23 97 % --  02/26/21 1600 106/70 99.14 F (37.3 C) Bladder (!) 101 15 98 % --  02/26/21 1500 116/77 98.96 F (37.2 C) -- (!) 103 15 98 % --  02/26/21 1433 -- -- -- -- -- 100 % --   PHYSICAL EXAM:  General:intubated, sedated on pressor Lungs: b/l air entry Heart: tachycardia Abdomen: soft , obese Extremities: atraumatic, no cyanosis. No edema. No  clubbing Skin: No rashes or lesions. Or bruising Lymph: Cervical, supraclavicular normal. Neurologic: cannot be assessed  Lab Results Recent Labs    02/26/21 0337 02/27/21 0353  WBC 12.0* 10.7*  HGB 14.1 13.7  HCT 44.3 42.6  NA 139 138  K 5.0 4.4  CL 98 97*  CO2 32 33*  BUN 27* 25*  CREATININE 1.30* 1.05   Liver Panel No results for input(s): PROT, ALBUMIN, AST, ALT, ALKPHOS, BILITOT, BILIDIR, IBILI in the last 72 hours. Sedimentation Rate No results for input(s): ESRSEDRATE in the last 72 hours. C-Reactive Protein No results for input(s): CRP in the last 72 hours.  Microbiology: BC- Neg Sputum culture neg Studies/Results: No results found.   Assessment/Plan: Acute on chronic hypoxic/hypercarbic respiratory failure needing intubation. Obesity hypoventilation syndrome Obstructive sleep apnea No source for infection  MRSA nares is neg  Pt being treated as pneumonia with zosyn- deesclaate unasyn to cover for aspiration pneumonia  Hypotension on pressor-  Cortisol OK  AKI-resolved  Discussed the management with [facility]

## 2021-02-27 NOTE — Progress Notes (Signed)
Pt continues to be agitated and irritable, fentanyl and versed were increased to 200 mcg and 8mg . RASS score remained -2 with him being able to follow simple commands and sustaining eye contact. Levophed was increased to 5 mcg throughout the shift as BP remains soft with systolic's in the low 38F. Foley was d/c yesterday and pt has had two urine occurrences both incontinent, followed by agitation and attempting to get out of bed. A total of 200cc measured urine output.

## 2021-02-27 NOTE — Progress Notes (Signed)
CRITICAL CARE PROGRESS NOTE    Name: Eddie Chapman MRN: [id number] DOB: 03-Apr-1961     LOS: 4  Referring physician: Dr Charna Archer  SUBJECTIVE FINDINGS & SIGNIFICANT EVENTS    Patient description:   Patient has a complex comorbid history including systolic CHF chronically, pulmonary hypertension, obstructive sleep apnea, chronic hypoxemia with respiratory failure, obesity hypoventilation syndrome with thoracic restriction, hormonal dysfunction with hypotestosteronism, dyslipidemia, metabolic syndrome, previous subarachnoid hemorrhage, history of traumatic thoracic spinal injury, depression.  Patient is hypoxemic at rest during interview and examination today.  Patient was admitted in January to the intensive care unit with altered mental status acute hypoxemic and hypercapnic respiratory failure with metabolic encephalopathy and left lower lobe severe pneumonia.  Throughout hospitalization he was noted to have lethargy with hypercapnic encephalopathy and became unresponsive a few times even postextubation while on medical floor.  Patient has never smoked in the past.  On this admission he is poorly responsive with hypercapnic hypoxemic respiratory failure    02/24/21- Patient responded well to mechanical ventilation with improvement in hypercapnia on ABG this am.  Seems that he had THC induced apnea/hypopnea based on laboratory testing.  There is bibasilar atelectasis induced severe hypoxemia, recruitment Metaneb therapy has been ordered. Plan to repeat CXR this afternoon with SBT today.  Reviewed plan with RN and RT today.    02/25/21- patient was unable to pass SBT today. He was able to come off propofol with RASS-0 and is weaned down on levophed.    02/26/21- patient continues to require levophed, he has been  difficult to wean. I have asked ID for consultation due to concern for sepsis possibly due to CAP. Unable to perform SBT today. Family at bedside.   02/27/21- patient on PRVC with heavy secretions, difficulty weaning with high narcotic tolerance. Patient had suicide attempt with requirement for psych.  Lines/tubes :   Microbiology/Sepsis markers: Results for orders placed or performed during the hospital encounter of 02/23/21  Resp Panel by RT-PCR (Flu A&B, Covid) Nasopharyngeal Swab     Status: None   Collection Time: 02/23/21 12:46 PM   Specimen: Nasopharyngeal Swab; Nasopharyngeal(NP) swabs in vial transport medium  Result Value Ref Range Status   SARS Coronavirus 2 by RT PCR NEGATIVE NEGATIVE Final    Comment: (NOTE) SARS-CoV-2 target nucleic acids are NOT DETECTED.  The SARS-CoV-2 RNA is generally detectable in upper respiratory specimens during the acute phase of infection. The lowest concentration of SARS-CoV-2 viral copies this assay can detect is 138 copies/mL. A negative result does not preclude SARS-Cov-2 infection and should not be used as the sole basis for treatment or other patient management decisions. A negative result may occur with  improper specimen collection/handling, submission of specimen other than nasopharyngeal swab, presence of viral mutation(s) within the areas targeted by this assay, and inadequate number of viral copies(<138 copies/mL). A negative result must be combined with clinical observations, patient history, and epidemiological information. The expected result is Negative.  Fact Sheet for Patients:  EntrepreneurPulse.com.au  Fact Sheet for Healthcare Providers:  IncredibleEmployment.be  This test is no t yet approved or cleared by the [place] FDA and  has been authorized for detection and/or diagnosis of SARS-CoV-2 by FDA under an Emergency Use Authorization (EUA). This EUA will remain  in effect  (meaning this test can be used) for the duration of the COVID-19 declaration under Section 564(b)(1) of the Act, 21 U.S.C.section 360bbb-3(b)(1), unless the authorization is terminated  or revoked sooner.  Influenza A by PCR NEGATIVE NEGATIVE Final   Influenza B by PCR NEGATIVE NEGATIVE Final    Comment: (NOTE) The Xpert Xpress SARS-CoV-2/FLU/RSV plus assay is intended as an aid in the diagnosis of influenza from Nasopharyngeal swab specimens and should not be used as a sole basis for treatment. Nasal washings and aspirates are unacceptable for Xpert Xpress SARS-CoV-2/FLU/RSV testing.  Fact Sheet for Patients: EntrepreneurPulse.com.au  Fact Sheet for Healthcare Providers: IncredibleEmployment.be  This test is not yet approved or cleared by the [place] FDA and has been authorized for detection and/or diagnosis of SARS-CoV-2 by FDA under an Emergency Use Authorization (EUA). This EUA will remain in effect (meaning this test can be used) for the duration of the COVID-19 declaration under Section 564(b)(1) of the Act, 21 U.S.C. section 360bbb-3(b)(1), unless the authorization is terminated or revoked.  Performed at [facility], [address] [place], [place] [address]   Culture, blood (routine x 2)     Status: None (Preliminary result)   Collection Time: 02/23/21 12:46 PM   Specimen: BLOOD  Result Value Ref Range Status   Specimen Description BLOOD RIGHT ARM  Final   Special Requests   Final    BOTTLES DRAWN AEROBIC AND ANAEROBIC Blood Culture results may not be optimal due to an excessive volume of blood received in culture bottles   Culture   Final    NO GROWTH 4 DAYS Performed at [facility], [address], [place], [place] [address]    Report Status PENDING  Incomplete  Culture, blood (routine x 2)     Status: None (Preliminary result)   Collection Time: 02/23/21  1:20 PM   Specimen: BLOOD  Result  Value Ref Range Status   Specimen Description BLOOD BLOOD LEFT HAND  Final   Special Requests   Final    BOTTLES DRAWN AEROBIC AND ANAEROBIC Blood Culture results may not be optimal due to an inadequate volume of blood received in culture bottles   Culture   Final    NO GROWTH 4 DAYS Performed at [facility], [address], [place], [place] [address]    Report Status PENDING  Incomplete  Urine Culture     Status: None   Collection Time: 02/23/21  1:29 PM   Specimen: Urine, Random  Result Value Ref Range Status   Specimen Description   Final    URINE, RANDOM Performed at [facility], [address] [place], [place] [address]    Special Requests   Final    NONE Performed at [facility], [address] [place], [place] [address]    Culture   Final    NO GROWTH Performed at [facility], [place] [address], [place], [place] [address]    Report Status 02/25/2021 FINAL  Final  MRSA PCR Screening     Status: None   Collection Time: 02/23/21  8:02 PM   Specimen: Nasopharyngeal  Result Value Ref Range Status   MRSA by PCR NEGATIVE NEGATIVE Final    Comment:        The GeneXpert MRSA Assay (FDA approved for NASAL specimens only), is one component of a comprehensive MRSA colonization surveillance program. It is not intended to diagnose MRSA infection nor to guide or monitor treatment for MRSA infections. Performed at [facility], [address] [place], [place] [address]   Culture, Respiratory w Gram Stain     Status: None (Preliminary result)   Collection Time: 02/24/21 11:15  AM   Specimen: Tracheal Aspirate; Respiratory  Result Value Ref Range Status   Specimen Description   Final    TRACHEAL ASPIRATE Performed at [facility], [address], [place], [place] [address]    Special Requests   Final    NONE Performed at [facility], [address] [place], [place] [address]    Gram Stain   Final     MODERATE WBC PRESENT,BOTH PMN AND MONONUCLEAR RARE SQUAMOUS EPITHELIAL CELLS PRESENT FEW GRAM POSITIVE COCCI FEW GRAM POSITIVE RODS    Culture   Final    Normal respiratory flora-no Staph aureus or Pseudomonas seen Performed at [facility] [address], [place], [place] [address]    Report Status PENDING  Incomplete  Culture, Respiratory w Gram Stain     Status: None (Preliminary result)   Collection Time: 02/25/21  4:53 PM   Specimen: Tracheal Aspirate; Respiratory  Result Value Ref Range Status   Specimen Description   Final    TRACHEAL ASPIRATE Performed at [facility], [address] [place], [place] [address]    Special Requests   Final    NONE Performed at [facility], [address] [place], [place] [address]    Gram Stain   Final    ABUNDANT WBC PRESENT, PREDOMINANTLY PMN RARE GRAM POSITIVE COCCI IN CLUSTERS    Culture   Final    TOO YOUNG TO READ Performed at [facility], [place] [address] [place], [place] [address]    Report Status PENDING  Incomplete  Respiratory (~20 pathogens) panel by PCR     Status: None   Collection Time: 02/26/21 10:27 AM   Specimen: Tracheal Aspirate; Respiratory  Result Value Ref Range Status   Adenovirus NOT DETECTED NOT DETECTED Final   Coronavirus 229E NOT DETECTED NOT DETECTED Final    Comment: (NOTE) The Coronavirus on the Respiratory Panel, DOES NOT test for the novel  Coronavirus (2019 nCoV)    Coronavirus HKU1 NOT DETECTED NOT DETECTED Final   Coronavirus NL63 NOT DETECTED NOT DETECTED Final   Coronavirus OC43 NOT DETECTED NOT DETECTED Final   Metapneumovirus NOT DETECTED NOT DETECTED Final   Rhinovirus / Enterovirus NOT DETECTED NOT DETECTED Final   Influenza A NOT DETECTED NOT DETECTED Final   Influenza B NOT DETECTED NOT DETECTED Final   Parainfluenza Virus 1 NOT DETECTED NOT DETECTED Final   Parainfluenza Virus 2 NOT DETECTED NOT DETECTED Final   Parainfluenza Virus 3 NOT DETECTED  NOT DETECTED Final   Parainfluenza Virus 4 NOT DETECTED NOT DETECTED Final   Respiratory Syncytial Virus NOT DETECTED NOT DETECTED Final   Bordetella pertussis NOT DETECTED NOT DETECTED Final   Bordetella Parapertussis NOT DETECTED NOT DETECTED Final   Chlamydophila pneumoniae NOT DETECTED NOT DETECTED Final   Mycoplasma pneumoniae NOT DETECTED NOT DETECTED Final    Comment: Performed at [facility], [place]. [address] [place],  [address]    Anti-infectives:  Anti-infectives (From admission, onward)   Start     Dose/Rate Route Frequency Ordered Stop   02/27/21 0000  vancomycin (VANCOREADY) IVPB 2000 mg/400 mL  Status:  Discontinued        2,000 mg 200 mL/hr over 120 Minutes Intravenous Every 24 hours 02/26/21 1224 02/26/21 1410   02/26/21 0800  vancomycin (VANCOREADY) IVPB 1250 mg/250 mL  Status:  Discontinued        1,250 mg 166.7 mL/hr over 90 Minutes Intravenous Every 12 hours 02/25/21  1730 02/26/21 1224   02/25/21 1830  vancomycin (VANCOREADY) IVPB 1500 mg/300 mL       "Followed by" Linked Group Details   1,500 mg 150 mL/hr over 120 Minutes Intravenous  Once 02/25/21 1730 02/25/21 2339   02/25/21 1830  piperacillin-tazobactam (ZOSYN) IVPB 3.375 g        3.375 g 12.5 mL/hr over 240 Minutes Intravenous Every 8 hours 02/25/21 1730     02/25/21 1815  vancomycin (VANCOREADY) IVPB 1000 mg/200 mL       "Followed by" Linked Group Details   1,000 mg 200 mL/hr over 60 Minutes Intravenous  Once 02/25/21 1730 02/25/21 2040   02/23/21 1800  cefTRIAXone (ROCEPHIN) 2 g in sodium chloride 0.9 % 100 mL IVPB  Status:  Discontinued        2 g 200 mL/hr over 30 Minutes Intravenous Every 24 hours 02/23/21 1701 02/25/21 1730   02/23/21 1715  azithromycin (ZITHROMAX) 500 mg in sodium chloride 0.9 % 250 mL IVPB  Status:  Discontinued        500 mg 250 mL/hr over 60 Minutes Intravenous Every 24 hours 02/23/21 1701 02/25/21 1730      PAST MEDICAL HISTORY   Past Medical History:   Diagnosis Date  . Adenomatous colon polyp   . Depression   . Ear drum perforation    left x2   . GERD (gastroesophageal reflux disease)   . Hyperlipidemia   . Hypertension   . T8 vertebral fracture [facility])      SURGICAL HISTORY   Past Surgical History:  Procedure Laterality Date  . CARPAL TUNNEL RELEASE     left hand  . COLONOSCOPY  09/18/2009, 09/15/2014  . COLONOSCOPY WITH PROPOFOL N/A 12/29/2017   Procedure: COLONOSCOPY WITH PROPOFOL;  Surgeon: Manya Silvas, MD;  Location: [facility] ENDOSCOPY;  Service: Endoscopy;  Laterality: N/A;  . EYE SURGERY    . FRACTURE SURGERY    . HERNIA REPAIR    . LIPOMA EXCISION    . SPINE SURGERY       FAMILY HISTORY   Family History  Problem Relation Age of Onset  . Cancer Mother   . Varicose Veins Mother      SOCIAL HISTORY   Social History   Tobacco Use  . Smoking status: Never Smoker  . Smokeless tobacco: Never Used  Vaping Use  . Vaping Use: Never used  Substance Use Topics  . Alcohol use: Never  . Drug use: Never     MEDICATIONS   Current Medication:  Current Facility-Administered Medications:  .  0.9 %  sodium chloride infusion, 250 mL, Intravenous, Continuous, Quale, Mark, MD, Last Rate: 5 mL/hr at 02/27/21 0600, Infusion Verify at 02/27/21 0600 .  chlorhexidine gluconate (MEDLINE KIT) (PERIDEX) 0.12 % solution 15 mL, 15 mL, Mouth Rinse, BID, Rust-Chester, Britton L, NP, 15 mL at 02/26/21 1936 .  Chlorhexidine Gluconate Cloth 2 % PADS 6 each, 6 each, Topical, Daily, Ottie Glazier, MD, 6 each at 02/26/21 0600 .  docusate (COLACE) 50 MG/5ML liquid 100 mg, 100 mg, Per Tube, BID, Darel Hong D, NP, 100 mg at 02/26/21 2154 .  docusate sodium (COLACE) capsule 100 mg, 100 mg, Oral, BID PRN, [name], Derrika Ruffalo, MD .  etomidate (AMIDATE) injection 30 mg, 30 mg, Intravenous, Once, Darel Hong D, NP .  feeding supplement (PROSource TF) liquid 45 mL, 45 mL, Per Tube, Daily, [name], Sathvik Tiedt, MD, 45 mL at 02/26/21 1213 .   feeding supplement (VITAL HIGH PROTEIN) liquid 1,000 mL,  1,000 mL, Per Tube, Q24H, Casey Fye, MD, 1,000 mL at 02/27/21 0030 .  fentaNYL (SUBLIMAZE) bolus via infusion 50-100 mcg, 50-100 mcg, Intravenous, Q15 min PRN, Darel Hong D, NP, 50 mcg at 02/23/21 1620 .  fentaNYL (SUBLIMAZE) injection 100 mcg, 100 mcg, Intravenous, Once, Darel Hong D, NP .  fentaNYL 2559mg in NS 2565m(1026mml) infusion-PREMIX, 0-400 mcg/hr, Intravenous, Continuous, Jisela Merlino, MD, Last Rate: 20 mL/hr at 02/27/21 0600, 200 mcg/hr at 02/27/21 0600 .  free water 30 mL, 30 mL, Per Tube, Q4H, Tameko Halder, MD, 30 mL at 02/27/21 0418 .  heparin injection 5,000 Units, 5,000 Units, Subcutaneous, Q8H, AleOttie GlazierD, 5,000 Units at 02/27/21 0630 .  ipratropium-albuterol (DUONEB) 0.5-2.5 (3) MG/3ML nebulizer solution 3 mL, 3 mL, Nebulization, Q4H PRN, KeeDarel Hong NP .  MEDLINE mouth rinse, 15 mL, Mouth Rinse, 10 times per day, Rust-Chester, Britton L, NP, 15 mL at 02/27/21 [id number] midazolam (VERSED) 50 mg in dextrose 5 % 50 mL (1 mg/mL) infusion, 0.5-10 mg/hr, Intravenous, Continuous, Ejay Lashley, MD, Last Rate: 8 mL/hr at 02/27/21 0600, 8 mg/hr at 02/27/21 0600 .  midazolam (VERSED) injection 2 mg, 2 mg, Intravenous, Q2H PRN, Rust-Chester, Britton L, NP, 2 mg at 02/25/21 1820 .  norepinephrine (LEVOPHED) 4mg38m 250mL24mmix infusion, 2-10 mcg/min, Intravenous, Titrated, QualeDelman Kitten Last Rate: 18.75 mL/hr at 02/27/21 0600, 5 mcg/min at 02/27/21 0600 .  pantoprazole (PROTONIX) injection 40 mg, 40 mg, Intravenous, Daily, KeeneDarel HongP, 40 mg at 02/26/21 0815 .  piperacillin-tazobactam (ZOSYN) IVPB 3.375 g, 3.375 g, Intravenous, Q8H, Jerrold Haskell, MD, Last Rate: 12.5 mL/hr at 02/27/21 0631, 3.375 g at 02/27/21 0631 .  polyethylene glycol (MIRALAX / GLYCOLAX) packet 17 g, 17 g, Oral, Daily PRN, AleskLanney Ginsd, MD .  polyethylene glycol (MIRALAX / GLYCOLAX) packet 17 g, 17 g, Per  Tube, Daily, KeeneDarel HongP, 17 g at 02/26/21 0812 [id number]   Divalproex sodium and Valproic acid    REVIEW OF SYSTEMS     Unable to obtain due to unresponsive state  PHYSICAL EXAMINATION   Vital Signs: Temp:  [98.1 F (36.7 C)-99.32 F (37.4 C)] 98.42 F (36.9 C) (05/31 0600) Pulse Rate:  [72-111] 74 (05/31 0600) Resp:  [15-26] 15 (05/31 0600) BP: (77-132)/(49-84) 88/52 (05/31 0600) SpO2:  [90 %-100 %] 94 % (05/31 0600) FiO2 (%):  [40 %-60 %] 60 % (05/31 0400) Weight:  [151.2 kg] 151.2 kg (05/31 0500)  GENERAL:Age appropriate obese male sedated on MV HEAD: Normocephalic, atraumatic.  EYES: Pupils equal, round, reactive to light.  No scleral icterus.  MOUTH: Moist mucosal membrane. NECK: Supple. No thyromegaly. No nodules. No JVD.  PULMONARY: crackles b/l CARDIOVASCULAR: S1 and S2. Regular rate and rhythm. No murmurs, rubs, or gallops.  GASTROINTESTINAL: Soft obese, nontender, non-distended. No masses. Positive bowel sounds. No hepatosplenomegaly.  MUSCULOSKELETAL: +edema  NEUROLOGIC: unresponsive GCS4T SKIN:intact,warm,dry   PERTINENT DATA     Infusions: . sodium chloride 5 mL/hr at 02/27/21 0600  . fentaNYL infusion INTRAVENOUS 200 mcg/hr (02/27/21 0600)  . midazolam 8 mg/hr (02/27/21 0600)  . norepinephrine (LEVOPHED) Adult infusion 5 mcg/min (02/27/21 0600)  . piperacillin-tazobactam (ZOSYN)  IV 3.375 g (02/27/21 0631)   Scheduled Medications: . chlorhexidine gluconate (MEDLINE KIT)  15 mL Mouth Rinse BID  . Chlorhexidine Gluconate Cloth  6 each Topical Daily  . docusate  100 mg Per Tube BID  . etomidate  30 mg Intravenous Once  . feeding supplement (PROSource  TF)  45 mL Per Tube Daily  . feeding supplement (VITAL HIGH PROTEIN)  1,000 mL Per Tube Q24H  . fentaNYL (SUBLIMAZE) injection  100 mcg Intravenous Once  . free water  30 mL Per Tube Q4H  . heparin  5,000 Units Subcutaneous Q8H  . mouth rinse  15 mL Mouth Rinse 10 times per day  .  pantoprazole (PROTONIX) IV  40 mg Intravenous Daily  . polyethylene glycol  17 g Per Tube Daily   PRN Medications: docusate sodium, fentaNYL, ipratropium-albuterol, midazolam, polyethylene glycol Hemodynamic parameters:   Intake/Output: 05/30 0701 - 05/31 0700 In: 2188 [I.V.:1063.8; NG/GT:599.2; IV Piggyback:525] Out: 508 [Urine:428; Emesis/NG output:80]  Ventilator  Settings: Vent Mode: PRVC FiO2 (%):  [40 %-60 %] 60 % Set Rate:  [15 bmp] 15 bmp Vt Set:  [550 mL] 550 mL PEEP:  [5 cmH20] 5 cmH20 Plateau Pressure:  [0 cmH20] 0 cmH20   LAB RESULTS:  Basic Metabolic Panel: Recent Labs  Lab 02/23/21 1246 02/24/21 0447 02/25/21 0407 02/26/21 0337 02/27/21 0353  NA 137 138 141 139 138  K 5.1 4.3 4.1 5.0 4.4  CL 93* 96* 96* 98 97*  CO2 35* 31 33* 32 33*  GLUCOSE 136* 147* 91 89 130*  BUN 26* 33* 36* 27* 25*  CREATININE 1.13 1.45* 1.47* 1.30* 1.05  CALCIUM 8.7* 8.9 8.6* 8.4* 8.5*  MG  --  2.1 2.5* 2.4 2.4  PHOS  --  2.0* 5.3* 4.3 3.8   Liver Function Tests: Recent Labs  Lab 02/23/21 1246  AST 30  ALT 20  ALKPHOS 54  BILITOT 1.2  PROT 7.7  ALBUMIN 4.1   No results for input(s): LIPASE, AMYLASE in the last 168 hours. No results for input(s): AMMONIA in the last 168 hours. CBC: Recent Labs  Lab 02/23/21 1246 02/24/21 0447 02/25/21 0407 02/26/21 0337 02/27/21 0353  WBC 18.6* 18.1* 17.4* 12.0* 10.7*  NEUTROABS 14.9*  --   --   --   --   HGB 16.9 15.8 14.9 14.1 13.7  HCT 52.3* 47.4 46.0 44.3 42.6  MCV 100.8* 96.7 100.7* 102.5* 101.2*  PLT 179 196 152 137* 145*   Cardiac Enzymes: No results for input(s): CKTOTAL, CKMB, CKMBINDEX, TROPONINI in the last 168 hours. BNP: Invalid input(s): POCBNP CBG: Recent Labs  Lab 02/26/21 1536 02/26/21 1940 02/27/21 0031 02/27/21 0359 02/27/21 0714  GLUCAP 96 122* 134* 135* 146*       IMAGING RESULTS:  Imaging: No results found. _0 @ No results found.   ASSESSMENT AND PLAN    -Multidisciplinary  rounds held today  Acute Hypoxic and hypecapnic Respiratory Failure -present on admission              Due to community acquired pneumonia/viral pna vs Obesity hypoventialtion syndrome vs OSA         -continue BIPAP - repeat ABG ordered. - COVID19 /RSV/FLUa&b negative - supplemental O2 during my evaluation -PRVC 40% - will perform infectious workup for pneumonia -Respiratory viral panel-in process -legionella ab-negative -strep pneumoniae ur AG-negative -sputum resp cultures-in process -reviewed pertinent imaging with patients family today - Ddimer    Septic Shock   - present on admission    - possible Left sided pneumonia   - patient drug screen abnormal suspect possible aspiration PNA post drug use vs Infectiuos Pneumonia as evidenced by Left lung infiltrate.    - MRSA PCR is negative   - patient on levophed   -s/p LR bolus 250cc/h    - oliguric    -  IV zosyn   - ID consult -Unclear regarding sepsis    ID -continue IV abx as prescibed -follow up cultures  GI/Nutrition GI PROPHYLAXIS as indicated DIET-->TF's as tolerated Constipation protocol as indicated  ENDO - ICU hypoglycemic\Hyperglycemia protocol -check FSBS per protocol   ELECTROLYTES -follow labs as needed -replace as needed -pharmacy consultation   DVT/GI PRX ordered -SCDs  TRANSFUSIONS AS NEEDED MONITOR FSBS ASSESS the need for LABS as needed   Critical care provider statement:    Critical care time (minutes):  33   Critical care time was exclusive of:  Separately billable procedures and treating other patients   Critical care was necessary to treat or prevent imminent or life-threatening deterioration of the following conditions:  acute hypercanic hypoxemic respiratory failure,  encephalopathy , oHs, OSA, multiple comoribid conditionss   Critical care was time spent personally by me on the following activities:  Development of treatment plan with patient or surrogate, discussions with  consultants, evaluation of patient's response to treatment, examination of patient, obtaining history from patient or surrogate, ordering and performing treatments and interventions, ordering and review of laboratory studies and re-evaluation of patient's condition.  I assumed direction of critical care for this patient from another provider in my specialty: no    This document was prepared using Dragon voice recognition software and may include unintentional dictation errors.    Ottie Glazier, M.D.  Division of [place]

## 2021-02-28 ENCOUNTER — Inpatient Hospital Stay: Payer: PPO

## 2021-02-28 DIAGNOSIS — J9601 Acute respiratory failure with hypoxia: Secondary | ICD-10-CM

## 2021-02-28 DIAGNOSIS — J9602 Acute respiratory failure with hypercapnia: Secondary | ICD-10-CM

## 2021-02-28 DIAGNOSIS — Z4682 Encounter for fitting and adjustment of non-vascular catheter: Secondary | ICD-10-CM | POA: Diagnosis not present

## 2021-02-28 DIAGNOSIS — J984 Other disorders of lung: Secondary | ICD-10-CM | POA: Diagnosis not present

## 2021-02-28 DIAGNOSIS — R4182 Altered mental status, unspecified: Secondary | ICD-10-CM | POA: Diagnosis not present

## 2021-02-28 DIAGNOSIS — E662 Morbid (severe) obesity with alveolar hypoventilation: Secondary | ICD-10-CM | POA: Diagnosis not present

## 2021-02-28 DIAGNOSIS — I5023 Acute on chronic systolic (congestive) heart failure: Secondary | ICD-10-CM | POA: Diagnosis not present

## 2021-02-28 DIAGNOSIS — I272 Pulmonary hypertension, unspecified: Secondary | ICD-10-CM | POA: Diagnosis not present

## 2021-02-28 DIAGNOSIS — J9690 Respiratory failure, unspecified, unspecified whether with hypoxia or hypercapnia: Secondary | ICD-10-CM | POA: Diagnosis not present

## 2021-02-28 DIAGNOSIS — I517 Cardiomegaly: Secondary | ICD-10-CM | POA: Diagnosis not present

## 2021-02-28 DIAGNOSIS — I5022 Chronic systolic (congestive) heart failure: Secondary | ICD-10-CM | POA: Diagnosis not present

## 2021-02-28 DIAGNOSIS — J9622 Acute and chronic respiratory failure with hypercapnia: Secondary | ICD-10-CM | POA: Diagnosis not present

## 2021-02-28 LAB — BASIC METABOLIC PANEL
Anion gap: 7 (ref 5–15)
BUN: 23 mg/dL (ref 8–23)
CO2: 32 mmol/L (ref 22–32)
Calcium: 8.7 mg/dL — ABNORMAL LOW (ref 8.9–10.3)
Chloride: 98 mmol/L (ref 98–111)
Creatinine, Ser: 0.87 mg/dL (ref 0.61–1.24)
GFR, Estimated: >60 mL/min (ref >60)
Glucose, Bld: 149 mg/dL — ABNORMAL HIGH (ref 70–99)
Potassium: 4.6 mmol/L (ref 3.5–5.1)
Sodium: 137 mmol/L (ref 135–145)

## 2021-02-28 LAB — MAGNESIUM: Magnesium: 2.3 mg/dL (ref 1.7–2.4)

## 2021-02-28 LAB — CULTURE, RESPIRATORY W GRAM STAIN
Culture: NORMAL
Culture: NORMAL

## 2021-02-28 LAB — CULTURE, BLOOD (ROUTINE X 2)
Culture: NO GROWTH
Culture: NO GROWTH

## 2021-02-28 LAB — GLUCOSE, CAPILLARY
Glucose-Capillary: 139 mg/dL — ABNORMAL HIGH (ref 70–99)
Glucose-Capillary: 145 mg/dL — ABNORMAL HIGH (ref 70–99)
Glucose-Capillary: 113 mg/dL — ABNORMAL HIGH (ref 70–99)
Glucose-Capillary: 142 mg/dL — ABNORMAL HIGH (ref 70–99)
Glucose-Capillary: 111 mg/dL — ABNORMAL HIGH (ref 70–99)
Glucose-Capillary: 123 mg/dL — ABNORMAL HIGH (ref 70–99)

## 2021-02-28 LAB — PHOSPHORUS: Phosphorus: 2.8 mg/dL (ref 2.5–4.6)

## 2021-02-28 MED ORDER — BUDESONIDE 0.25 MG/2ML IN SUSP
0.2500 mg | Freq: Two times a day (BID) | RESPIRATORY_TRACT | Status: DC
  Administered 2021-02-28 – 2021-03-12 (×24): 0.25 mg via RESPIRATORY_TRACT
  Filled 2021-02-28 (×24): qty 2

## 2021-02-28 MED ORDER — SODIUM CHLORIDE 0.9 % IV SOLN
3.0000 g | Freq: Four times a day (QID) | INTRAVENOUS | Status: DC
  Administered 2021-02-28 – 2021-03-01 (×6): 3 g via INTRAVENOUS
  Filled 2021-02-28 (×3): qty 3
  Filled 2021-02-28: qty 8
  Filled 2021-02-28: qty 3
  Filled 2021-02-28 (×3): qty 8
  Filled 2021-02-28 (×2): qty 3

## 2021-02-28 MED ORDER — IPRATROPIUM-ALBUTEROL 0.5-2.5 (3) MG/3ML IN SOLN
3.0000 mL | RESPIRATORY_TRACT | Status: DC
  Administered 2021-02-28 – 2021-03-10 (×56): 3 mL via RESPIRATORY_TRACT
  Filled 2021-02-28 (×54): qty 3

## 2021-02-28 NOTE — Progress Notes (Addendum)
NAME:  Eddie Chapman, MRN:  [id number], DOB:  June 06, 1961, LOS: 5 ADMISSION DATE:  02/23/2021 Patient has a complex comorbid history including systolic CHF chronically, pulmonary hypertension, obstructive sleep apnea, chronic hypoxemia with respiratory failure, obesity hypoventilation syndrome with thoracic restriction, hormonal dysfunction with hypotestosteronism, dyslipidemia, metabolic syndrome, previous subarachnoid hemorrhage, history of traumatic thoracic spinal injury, depression.  Patient was admitted in January to the intensive care unit with altered mental status acute hypoxemic and hypercapnic respiratory failure with metabolic encephalopathy and left lower lobe severe pneumonia.  Throughout hospitalization he was noted to have lethargy with hypercapnic encephalopathy and became unresponsive a few times even postextubation while on medical floor.  Patient has never smoked in the past.  On this admission he is poorly responsive with hypercapnic hypoxemic respiratory failure     Significant Hospital Events: Including procedures, antibiotic start and stop dates in addition to other pertinent events     02/24/21- Patient responded well to mechanical ventilation with improvement in hypercapnia on ABG this am.  Seems that he had THC induced apnea/hypopnea based on laboratory testing.  There is bibasilar atelectasis induced severe hypoxemia, recruitment Metaneb therapy has been ordered. Plan to repeat CXR this afternoon with SBT today.  Reviewed plan with RN and RT today.    02/25/21- patient was unable to pass SBT today. He was able to come off propofol with RASS-0 and is weaned down on levophed.    02/26/21- patient continues to require levophed, he has been difficult to wean. I have asked ID for consultation due to concern for sepsis possibly due to CAP. Unable to perform SBT today. Family at bedside.   02/27/21- patient on PRVC with heavy secretions, difficulty weaning with  high narcotic tolerance. Patient had suicide attempt with requirement for psych.  6/1 failing to wean from vent    Antimicrobials:   Antibiotics Given (last 72 hours)    Date/Time Action Medication Dose Rate   02/25/21 1633 New Bag/Given   azithromycin (ZITHROMAX) 500 mg in sodium chloride 0.9 % 250 mL IVPB 500 mg 250 mL/hr   02/25/21 1743 New Bag/Given   piperacillin-tazobactam (ZOSYN) IVPB 3.375 g 3.375 g 12.5 mL/hr   02/25/21 1854 New Bag/Given   vancomycin (VANCOREADY) IVPB 1000 mg/200 mL 1,000 mg 200 mL/hr   02/25/21 2049 New Bag/Given   vancomycin (VANCOREADY) IVPB 1500 mg/300 mL 1,500 mg 150 mL/hr   02/26/21 0540 New Bag/Given   piperacillin-tazobactam (ZOSYN) IVPB 3.375 g 3.375 g 12.5 mL/hr   02/26/21 8916 New Bag/Given   vancomycin (VANCOREADY) IVPB 1250 mg/250 mL 1,250 mg 166.7 mL/hr   02/26/21 1433 New Bag/Given   piperacillin-tazobactam (ZOSYN) IVPB 3.375 g 3.375 g 12.5 mL/hr   02/26/21 2154 New Bag/Given   piperacillin-tazobactam (ZOSYN) IVPB 3.375 g 3.375 g 12.5 mL/hr   02/27/21 0631 New Bag/Given   piperacillin-tazobactam (ZOSYN) IVPB 3.375 g 3.375 g 12.5 mL/hr   02/27/21 1358 New Bag/Given   piperacillin-tazobactam (ZOSYN) IVPB 3.375 g 3.375 g 12.5 mL/hr   02/27/21 2131 New Bag/Given   piperacillin-tazobactam (ZOSYN) IVPB 3.375 g 3.375 g 12.5 mL/hr         Interim History / Subjective:  Remains intubated,sedated On pressors +septic shock       Objective   Blood pressure 111/73, pulse 61, temperature (!) 96.62 F (35.9 C), resp. rate 15, height '5\' 10"'  (1.778 m), weight (!) 151.1 kg, SpO2 95 %.    Vent Mode: PRVC FiO2 (%):  [35 %-100 %] 50 % Set Rate:  [  15 bmp] 15 bmp Vt Set:  [550 mL] 550 mL PEEP:  [5 cmH20-10 cmH20] 5 cmH20 Plateau Pressure:  [0 cmH20] 0 cmH20   Intake/Output Summary (Last 24 hours) at 02/28/2021 0721 Last data filed at 02/28/2021 0700 Gross per 24 hour  Intake 3289.45 ml  Output 100 ml  Net 3189.45 ml   Filed Weights    02/26/21 0500 02/27/21 0500 02/28/21 0500  Weight: (!) 146.3 kg (!) 151.2 kg (!) 151.1 kg      REVIEW OF SYSTEMS  PATIENT IS UNABLE TO PROVIDE COMPLETE REVIEW OF SYSTEMS DUE TO SEVERE CRITICAL ILLNESS AND TOXIC METABOLIC ENCEPHALOPATHY   PHYSICAL EXAMINATION:  GENERAL:critically ill appearing, +resp distress HEAD: Normocephalic, atraumatic.  EYES: Pupils equal, round, reactive to light.  No scleral icterus.  MOUTH: Moist mucosal membrane. NECK: Supple. PULMONARY: +rhonchi, +wheezing CARDIOVASCULAR: S1 and S2. Regular rate and rhythm. No murmurs, rubs, or gallops.  GASTROINTESTINAL: Soft, nontender, -distended. Positive bowel sounds.  MUSCULOSKELETAL: No swelling, clubbing, or edema.  NEUROLOGIC: obtunded SKIN:intact,warm,dry   Labs/imaging that I havepersonally reviewed  (right click and "Reselect all SmartList Selections" daily)       ASSESSMENT AND PLAN SYNOPSIS  60 yo morbidly obese WM with acute and severe hypoxic and hypercapnic resp failure with CAP with severe toxic metabolic encephalopathy  Severe ACUTE Hypoxic and Hypercapnic Respiratory Failure -continue Mechanical Ventilator support -continue Bronchodilator Therapy -Wean Fio2 and PEEP as tolerated -VAP/VENT bundle implementation -will perform SAT/SBT when respiratory parameters are met  Septic shock -use vasopressors to keep MAP>65 -follow ABG and LA -follow up cultures -emperic ABX -consider stress dose steroids -aggressive IV fluid Resuscitation    CARDIAC ICU monitoring   ACUTE KIDNEY INJURY/Renal Failure -continue Foley Catheter-assess need -Avoid nephrotoxic agents -Follow urine output, BMP -Ensure adequate renal perfusion, optimize oxygenation -Renal dose medications   NEUROLOGY Acute toxic metabolic encephalopathy, need for sedation Goal RASS -2 to -3   SEPTIC SHOCK -use vasopressors to keep MAP>65 as needed -follow ABG and LA -follow up cultures -emperic ABX -consider  stress dose steroids -aggressive IV fluid resuscitation  INFECTIOUS DISEASE -continue antibiotics as prescribed -follow up cultures -follow up ID consultation  ENDO - ICU hypoglycemic\Hyperglycemia protocol -check FSBS per protocol   GI GI PROPHYLAXIS as indicated  NUTRITIONAL STATUS DIET-->TF's as tolerated Constipation protocol as indicated   ELECTROLYTES -follow labs as needed -replace as needed -pharmacy consultation and following     Best practice (right click and "Reselect all SmartList Selections" daily)  Diet:  Tube Feed  Pain/Anxiety/Delirium protocol (if indicated): Yes (RASS goal -2) VAP protocol (if indicated): Yes DVT prophylaxis: Subcutaneous Heparin GI prophylaxis: PPI Glucose control:  SSI Yes Central venous access:  Yes, and it is still needed Arterial line:  N/A Foley:  Yes, and it is still needed Mobility:  bed rest  PT consulted: N/A Code Status:  full code Disposition: ICU  Labs   CBC: Recent Labs  Lab 02/23/21 1246 02/24/21 0447 02/25/21 0407 02/26/21 0337 02/27/21 0353  WBC 18.6* 18.1* 17.4* 12.0* 10.7*  NEUTROABS 14.9*  --   --   --   --   HGB 16.9 15.8 14.9 14.1 13.7  HCT 52.3* 47.4 46.0 44.3 42.6  MCV 100.8* 96.7 100.7* 102.5* 101.2*  PLT 179 196 152 137* 145*    Basic Metabolic Panel: Recent Labs  Lab 02/24/21 0447 02/25/21 0407 02/26/21 0337 02/27/21 0353 02/28/21 0343  NA 138 141 139 138 137  K 4.3 4.1 5.0 4.4 4.6  CL 96* 96*  98 97* 98  CO2 31 33* 32 33* 32  GLUCOSE 147* 91 89 130* 149*  BUN 33* 36* 27* 25* 23  CREATININE 1.45* 1.47* 1.30* 1.05 0.87  CALCIUM 8.9 8.6* 8.4* 8.5* 8.7*  MG 2.1 2.5* 2.4 2.4 2.3  PHOS 2.0* 5.3* 4.3 3.8 2.8   GFR: Estimated Creatinine Clearance: 131.4 mL/min (by C-G formula based on SCr of 0.87 mg/dL). Recent Labs  Lab 02/23/21 1246 02/23/21 1446 02/24/21 0447 02/25/21 0407 02/26/21 0337 02/27/21 0350 02/27/21 0353  PROCALCITON  --   --   --   --  0.19 0.13  --   WBC  18.6*  --  18.1* 17.4* 12.0*  --  10.7*  LATICACIDVEN 1.5 1.7  --   --   --   --   --     Liver Function Tests: Recent Labs  Lab 02/23/21 1246  AST 30  ALT 20  ALKPHOS 54  BILITOT 1.2  PROT 7.7  ALBUMIN 4.1   No results for input(s): LIPASE, AMYLASE in the last 168 hours. No results for input(s): AMMONIA in the last 168 hours.  ABG    Component Value Date/Time   PHART 7.56 (H) 02/24/2021 0500   PCO2ART 42 02/24/2021 0500   PO2ART 64 (L) 02/24/2021 0500   HCO3 37.6 (H) 02/24/2021 0500   O2SAT 94.9 02/24/2021 0500    CBG: Recent Labs  Lab 02/27/21 1104 02/27/21 1522 02/27/21 1929 02/27/21 2320 02/28/21 0318  GLUCAP 131* 126* 140* 130* 139*    Allergies Allergies  Allergen Reactions  . Divalproex Sodium Other (See Comments)    Elevated liver enzymes  . Valproic Acid     unknown       DVT/GI PRX  assessed I Assessed the need for Labs I Assessed the need for Foley I Assessed the need for Central Venous Line Family Discussion when available I Assessed the need for Mobilization I made an Assessment of medications to be adjusted accordingly Safety Risk assessment completed  CASE DISCUSSED IN MULTIDISCIPLINARY ROUNDS WITH ICU TEAM     Critical Care Time devoted to patient care services described in this note is 55 minutes.  Critical care was necessary to treat or prevent imminent or life-threatening deterioration. Overall, patient is critically ill, prognosis is guarded.  Patient with Multiorgan failure and at high risk for cardiac arrest and death.    Corrin Parker, M.D.  [facility]  Medical Director [place] [facility] Cardio-Pulmonary Department

## 2021-02-28 NOTE — Progress Notes (Signed)
Order Requisition prayer request from family member bedside. Patient girlfriend was able to share her faith and hopes for her boyfriend. She request  that a chaplain visit and pray as ofyten as possible. Will inform the chaplain assigned to unit of this request.

## 2021-02-28 NOTE — Progress Notes (Signed)
  GOALS OF CARE DISCUSSION  The Clinical status was relayed to family in detail. Father and Girlfriend  Updated and notified of patients medical condition.    Patient remains unresponsive and will not open eyes to command.   Patient is having a weak cough and struggling to remove secretions.   Patient with increased WOB and using accessory muscles to breathe Explained to family course of therapy and the modalities    Patient with Progressive multiorgan failure with a very high probablity of a very minimal chance of meaningful recovery despite all aggressive and optimal medical therapy.  PATIENT REMAINS FULL CODE  Continue Vent support Continue PRessors Recommend DNR status...but remains full code   Family are satisfied with Plan of action and management. All questions answered  Additional CC time 25 mins   Torrie Namba [name], M.D.  [facility]  Medical Director [place] Director [facility] Cardio-Pulmonary Department

## 2021-02-28 NOTE — Plan of Care (Signed)
  Problem: Health Behavior/Discharge Planning: Goal: Ability to manage health-related needs will improve Outcome: Not Progressing   Problem: Clinical Measurements: Goal: Ability to maintain clinical measurements within normal limits will improve Outcome: Not Progressing Goal: Will remain free from infection Outcome: Not Progressing Goal: Diagnostic test results will improve Outcome: Not Progressing Goal: Respiratory complications will improve Outcome: Not Progressing Goal: Cardiovascular complication will be avoided Outcome: Not Progressing   Problem: Activity: Goal: Risk for activity intolerance will decrease Outcome: Not Progressing   Problem: Coping: Goal: Level of anxiety will decrease Outcome: Not Progressing   Problem: Elimination: Goal: Will not experience complications related to bowel motility Outcome: Not Progressing   Problem: Pain Managment: Goal: General experience of comfort will improve Outcome: Not Progressing

## 2021-02-28 NOTE — Progress Notes (Signed)
Date of Admission:  02/23/2021      ID: Eddie Chapman is a 60 y.o. male Active Problems:   Respiratory failure with hypercapnia (HCC)    Subjective: Remains intubated  Medications:  . budesonide (PULMICORT) nebulizer solution  0.25 mg Nebulization BID  . chlorhexidine gluconate (MEDLINE KIT)  15 mL Mouth Rinse BID  . Chlorhexidine Gluconate Cloth  6 each Topical Daily  . docusate  100 mg Per Tube BID  . etomidate  30 mg Intravenous Once  . feeding supplement (PROSource TF)  45 mL Per Tube Daily  . feeding supplement (VITAL HIGH PROTEIN)  1,000 mL Per Tube Q24H  . fentaNYL (SUBLIMAZE) injection  100 mcg Intravenous Once  . free water  30 mL Per Tube Q4H  . heparin  5,000 Units Subcutaneous Q8H  . ipratropium-albuterol  3 mL Nebulization Q4H  . mouth rinse  15 mL Mouth Rinse 10 times per day  . pantoprazole (PROTONIX) IV  40 mg Intravenous Daily  . polyethylene glycol  17 g Per Tube Daily    Objective: Vital signs in last 24 hours: Patient Vitals for the past 24 hrs:  BP Temp Temp src Pulse Resp SpO2 Weight  02/28/21 1300 116/75 97.7 F (36.5 C) -- 94 15 91 % --  02/28/21 1200 110/77 (!) 96.98 F (36.1 C) Esophageal 99 15 91 % --  02/28/21 1100 132/81 -- -- 84 15 93 % --  02/28/21 1000 103/69 (!) 96.98 F (36.1 C) -- 80 15 95 % --  02/28/21 0945 (!) 127/94 (!) 96.98 F (36.1 C) Esophageal 86 15 94 % --  02/28/21 0930 101/69 (!) 96.98 F (36.1 C) Esophageal 75 15 95 % --  02/28/21 0915 111/78 (!) 96.98 F (36.1 C) Esophageal 77 15 95 % --  02/28/21 0900 117/78 (!) 95.72 F (35.4 C) Esophageal 82 (!) 30 97 % --  02/28/21 0845 117/79 (!) 96.98 F (36.1 C) Esophageal 74 15 97 % --  02/28/21 0830 105/76 (!) 96.8 F (36 C) Esophageal 77 15 98 % --  02/28/21 0815 117/73 (!) 96.98 F (36.1 C) -- 80 15 97 % --  02/28/21 0800 (!) 156/93 (!) 96.8 F (36 C) Esophageal 78 14 98 % --  02/28/21 0745 132/84 (!) 96.44 F (35.8 C) -- 73 15 96 % --  02/28/21 0700 111/73 (!)  96.62 F (35.9 C) Esophageal 61 15 95 % --  02/28/21 0645 125/72 (!) 96.44 F (35.8 C) -- 66 15 95 % --  02/28/21 0630 123/74 (!) 97.52 F (36.4 C) -- 90 18 94 % --  02/28/21 0615 (!) 117/91 (!) 96.08 F (35.6 C) -- 69 15 99 % --  02/28/21 0600 102/66 (!) 96.26 F (35.7 C) -- 71 15 96 % --  02/28/21 0545 115/88 (!) 97.52 F (36.4 C) -- 78 18 97 % --  02/28/21 0530 116/64 (!) 97.52 F (36.4 C) -- 66 15 97 % --  02/28/21 0515 112/70 (!) 97.16 F (36.2 C) -- 66 15 98 % --  02/28/21 0500 (!) 102/59 97.7 F (36.5 C) -- 62 15 97 % (!) 151.1 kg  02/28/21 0445 100/61 97.88 F (36.6 C) -- (!) 53 15 97 % --  02/28/21 0430 (!) 87/56 97.88 F (36.6 C) -- (!) 59 15 96 % --  02/28/21 0415 (!) 89/53 98.06 F (36.7 C) -- (!) 59 15 97 % --  02/28/21 0400 (!) 96/57 98.24 F (36.8 C) -- 65 15 97 % --  02/28/21 0345 121/68 98.24 F (36.8 C) -- 66 15 98 % --  02/28/21 0330 (!) 96/51 98.24 F (36.8 C) -- 70 15 97 % --  02/28/21 0325 -- 98.24 F (36.8 C) -- 63 15 98 % --  02/28/21 0315 (!) 121/59 98.24 F (36.8 C) -- 60 15 98 % --  02/28/21 0300 123/68 98.24 F (36.8 C) -- 60 15 98 % --  02/28/21 0245 109/64 98.06 F (36.7 C) -- 60 15 97 % --  02/28/21 0230 109/61 98.06 F (36.7 C) -- (!) 53 15 97 % --  02/28/21 0215 104/61 98.06 F (36.7 C) -- (!) 55 15 96 % --  02/28/21 0200 (!) 96/59 98.06 F (36.7 C) -- (!) 58 15 96 % --  02/28/21 0145 (!) 101/58 98.06 F (36.7 C) -- (!) 58 15 96 % --  02/28/21 0130 (!) 103/56 98.06 F (36.7 C) -- 60 15 96 % --  02/28/21 0115 (!) 101/59 98.06 F (36.7 C) -- (!) 58 15 96 % --  02/28/21 0100 102/60 97.88 F (36.6 C) -- 63 15 96 % --  02/28/21 0046 (!) 88/52 98.06 F (36.7 C) -- 74 15 96 % --  02/28/21 0045 (!) 87/48 98.06 F (36.7 C) -- 73 15 96 % --  02/28/21 0030 (!) 99/56 97.88 F (36.6 C) -- 70 15 96 % --  02/28/21 0015 (!) 98/58 98.24 F (36.8 C) -- 75 15 96 % --  02/28/21 0000 (!) 96/57 98.24 F (36.8 C) -- 77 15 96 % --  02/27/21 2345  108/62 98.24 F (36.8 C) -- 85 15 97 % --  02/27/21 2330 (!) 94/57 98.6 F (37 C) -- 82 15 97 % --  02/27/21 2315 103/61 98.42 F (36.9 C) -- 82 15 96 % --  02/27/21 2300 100/64 98.6 F (37 C) -- 82 15 96 % --  02/27/21 2248 -- 98.6 F (37 C) -- 79 15 97 % --  02/27/21 2245 (!) 100/53 98.42 F (36.9 C) -- 90 15 96 % --  02/27/21 2230 (!) 95/56 98.6 F (37 C) -- 66 15 96 % --  02/27/21 2215 (!) 101/57 98.42 F (36.9 C) -- 71 15 96 % --  02/27/21 2200 (!) 92/49 98.42 F (36.9 C) -- 74 15 96 % --  02/27/21 2145 (!) 91/49 98.42 F (36.9 C) -- 68 15 96 % --  02/27/21 2130 (!) 94/53 98.6 F (37 C) -- 72 15 96 % --  02/27/21 2115 (!) 92/50 98.6 F (37 C) -- 69 15 96 % --  02/27/21 2100 (!) 93/51 98.6 F (37 C) -- 75 15 96 % --  02/27/21 2045 (!) 89/51 98.78 F (37.1 C) -- 74 15 96 % --  02/27/21 2032 (!) 83/47 98.78 F (37.1 C) -- 76 15 96 % --  02/27/21 2030 (!) 83/46 98.96 F (37.2 C) -- 82 15 95 % --  02/27/21 2015 (!) 83/53 99.14 F (37.3 C) -- 82 15 95 % --  02/27/21 2008 -- -- -- -- -- 100 % --  02/27/21 2000 (!) 88/49 99.14 F (37.3 C) Esophageal 86 15 95 % --  02/27/21 1936 -- 99.4 F (37.4 C) Oral 90 15 98 % --  02/27/21 1930 109/67 98.96 F (37.2 C) -- 91 15 95 % --  02/27/21 1915 114/74 98.96 F (37.2 C) -- 89 15 96 % --  02/27/21 1900 (!) 80/57 99.32 F (37.4 C) -- 91 15 94 % --  02/27/21 1815 (!) 141/89 99.14 F (37.3 C) -- 87 15 95 % --  02/27/21 1800 (!) 75/49 98.96 F (37.2 C) -- (!) 104 15 95 % --  02/27/21 1700 (!) 90/58 98.96 F (37.2 C) -- (!) 115 15 96 % --  02/27/21 1635 -- 99.32 F (37.4 C) -- (!) 124 20 93 % --  02/27/21 1633 -- 99.14 F (37.3 C) -- (!) 130 -- (!) 88 % --  02/27/21 1630 -- 99.14 F (37.3 C) -- (!) 116 15 91 % --  02/27/21 1629 -- 98.96 F (37.2 C) -- (!) 122 (!) 33 93 % --  02/27/21 1627 -- 98.96 F (37.2 C) -- (!) 121 19 (!) 86 % --  02/27/21 1626 -- 98.96 F (37.2 C) -- (!) 121 17 (!) 86 % --  02/27/21 1625 -- 98.78 F  (37.1 C) -- (!) 127 19 (!) 87 % --  02/27/21 1600 100/75 98.42 F (36.9 C) Esophageal (!) 118 15 96 % --  02/27/21 1500 98/63 98.42 F (36.9 C) -- (!) 118 15 96 % --  02/27/21 1400 (!) 145/84 98.78 F (37.1 C) -- (!) 125 (!) 33 (!) 73 % --   PHYSICAL EXAM:  General: remains intubated Lungs: b/l air entry Heart: tachycardia Abdomen: Soft,  Extremities: atraumatic, no cyanosis. No edema. No clubbing Skin: No rashes or lesions. Or bruising Neurologic: cannot assess  Lab Results Recent Labs    02/26/21 0337 02/27/21 0353 02/28/21 0343  WBC 12.0* 10.7*  --   HGB 14.1 13.7  --   HCT 44.3 42.6  --   NA 139 138 137  K 5.0 4.4 4.6  CL 98 97* 98  CO2 32 33* 32  BUN 27* 25* 23  CREATININE 1.30* 1.05 0.87   Liver Panel No results for input(s): PROT, ALBUMIN, AST, ALT, ALKPHOS, BILITOT, BILIDIR, IBILI in the last 72 hours. Sedimentation Rate No results for input(s): ESRSEDRATE in the last 72 hours. C-Reactive Protein No results for input(s): CRP in the last 72 hours.  Microbiology: 02/23/2021 blood culture no growth.  Studies/Results: DG Chest Port 1 View  Result Date: 02/28/2021 CLINICAL DATA:  Progressive multilevel organ failure. Increased work of breathing. EXAM: PORTABLE CHEST 1 VIEW COMPARISON:  Single-view of the chest 02/24/2021 and 02/23/2021. FINDINGS: Endotracheal tube and OG tube remain in place, unchanged. Left worse than right basilar airspace opacities persist. Aeration in the right lung base is worse than on the most recent exam. Heart size is enlarged. Aortic atherosclerosis. IMPRESSION: Worsened aeration in the right lung base. Airspace disease in the left lung base appears unchanged since the most recent study. ETT is in good position. OG tube tip is below the inferior margin of the film. Electronically Signed   By: Inge Rise M.D.   On: 02/28/2021 13:01     Assessment/Plan:  Acute on chronic hypoxic/hypercarbic respiratory failure needing  intubation. There is likely a combination of obesity hypoventilation syndrome, obstructive sleep apnea and narcotic use. No source of infection identified Patient is currently on day 6 of antibiotic.  He was on Zosyn which has been changed to Unasyn for possible aspiration pneumonia.  Would DC antibiotics after today.  Hypotension on pressors.  Cortisol levels okay.  AKI resolved  Discussed the management of the primary team ID will sign off call if needed.

## 2021-03-01 ENCOUNTER — Inpatient Hospital Stay: Payer: Self-pay

## 2021-03-01 DIAGNOSIS — J9601 Acute respiratory failure with hypoxia: Secondary | ICD-10-CM | POA: Diagnosis not present

## 2021-03-01 DIAGNOSIS — E662 Morbid (severe) obesity with alveolar hypoventilation: Secondary | ICD-10-CM | POA: Diagnosis not present

## 2021-03-01 DIAGNOSIS — J9602 Acute respiratory failure with hypercapnia: Secondary | ICD-10-CM | POA: Diagnosis not present

## 2021-03-01 DIAGNOSIS — I5023 Acute on chronic systolic (congestive) heart failure: Secondary | ICD-10-CM | POA: Diagnosis not present

## 2021-03-01 LAB — BASIC METABOLIC PANEL
Anion gap: 9 (ref 5–15)
BUN: 28 mg/dL — ABNORMAL HIGH (ref 8–23)
CO2: 33 mmol/L — ABNORMAL HIGH (ref 22–32)
Calcium: 9 mg/dL (ref 8.9–10.3)
Chloride: 97 mmol/L — ABNORMAL LOW (ref 98–111)
Creatinine, Ser: 0.85 mg/dL (ref 0.61–1.24)
GFR, Estimated: >60 mL/min (ref >60)
Glucose, Bld: 128 mg/dL — ABNORMAL HIGH (ref 70–99)
Potassium: 4.7 mmol/L (ref 3.5–5.1)
Sodium: 139 mmol/L (ref 135–145)

## 2021-03-01 LAB — GLUCOSE, CAPILLARY
Glucose-Capillary: 122 mg/dL — ABNORMAL HIGH (ref 70–99)
Glucose-Capillary: 140 mg/dL — ABNORMAL HIGH (ref 70–99)
Glucose-Capillary: 145 mg/dL — ABNORMAL HIGH (ref 70–99)
Glucose-Capillary: 179 mg/dL — ABNORMAL HIGH (ref 70–99)
Glucose-Capillary: 133 mg/dL — ABNORMAL HIGH (ref 70–99)

## 2021-03-01 LAB — MAGNESIUM: Magnesium: 2.5 mg/dL — ABNORMAL HIGH (ref 1.7–2.4)

## 2021-03-01 LAB — PHOSPHORUS: Phosphorus: 3.4 mg/dL (ref 2.5–4.6)

## 2021-03-01 MED ORDER — MIDODRINE HCL 5 MG PO TABS
10.0000 mg | ORAL_TABLET | Freq: Three times a day (TID) | ORAL | Status: DC
  Administered 2021-03-01 – 2021-03-08 (×19): 10 mg
  Filled 2021-03-01 (×20): qty 2

## 2021-03-01 MED ORDER — BISACODYL 10 MG RE SUPP
10.0000 mg | Freq: Every day | RECTAL | Status: DC | PRN
  Filled 2021-03-01: qty 1

## 2021-03-01 MED ORDER — HYDROCORTISONE NA SUCCINATE PF 100 MG IJ SOLR
100.0000 mg | Freq: Two times a day (BID) | INTRAMUSCULAR | Status: DC
  Administered 2021-03-01 – 2021-03-05 (×9): 100 mg via INTRAVENOUS
  Filled 2021-03-01 (×9): qty 2

## 2021-03-01 MED ORDER — NOREPINEPHRINE 4 MG/250ML-% IV SOLN
0.0000 ug/min | INTRAVENOUS | Status: DC
  Administered 2021-03-01: 6 ug/min via INTRAVENOUS
  Administered 2021-03-02: 3 ug/min via INTRAVENOUS
  Administered 2021-03-03: 2 ug/min via INTRAVENOUS
  Filled 2021-03-01 (×2): qty 250

## 2021-03-01 MED ORDER — SODIUM CHLORIDE 0.9% FLUSH
10.0000 mL | INTRAVENOUS | Status: DC | PRN
  Administered 2021-03-20: 30 mL

## 2021-03-01 MED ORDER — INSULIN ASPART 100 UNIT/ML IJ SOLN
0.0000 [IU] | INTRAMUSCULAR | Status: DC
  Administered 2021-03-01: 2 [IU] via SUBCUTANEOUS
  Administered 2021-03-01: 3 [IU] via SUBCUTANEOUS
  Administered 2021-03-02 (×3): 2 [IU] via SUBCUTANEOUS
  Administered 2021-03-02: 3 [IU] via SUBCUTANEOUS
  Administered 2021-03-02: 2 [IU] via SUBCUTANEOUS
  Administered 2021-03-02: 3 [IU] via SUBCUTANEOUS
  Administered 2021-03-03 (×2): 2 [IU] via SUBCUTANEOUS
  Administered 2021-03-03: 1 [IU] via SUBCUTANEOUS
  Administered 2021-03-03 – 2021-03-10 (×15): 2 [IU] via SUBCUTANEOUS
  Administered 2021-03-10: 3 [IU] via SUBCUTANEOUS
  Administered 2021-03-11 (×3): 2 [IU] via SUBCUTANEOUS
  Administered 2021-03-12: 3 [IU] via SUBCUTANEOUS
  Administered 2021-03-12 – 2021-03-17 (×11): 2 [IU] via SUBCUTANEOUS
  Administered 2021-03-18: 3 [IU] via SUBCUTANEOUS
  Administered 2021-03-18 – 2021-03-19 (×5): 2 [IU] via SUBCUTANEOUS
  Administered 2021-03-19: 3 [IU] via SUBCUTANEOUS
  Administered 2021-03-19 – 2021-03-20 (×7): 2 [IU] via SUBCUTANEOUS
  Administered 2021-03-21: 3 [IU] via SUBCUTANEOUS
  Administered 2021-03-21 – 2021-03-22 (×5): 2 [IU] via SUBCUTANEOUS
  Administered 2021-03-22 – 2021-03-23 (×6): 3 [IU] via SUBCUTANEOUS
  Administered 2021-03-23 – 2021-03-24 (×6): 2 [IU] via SUBCUTANEOUS
  Administered 2021-03-24 (×3): 3 [IU] via SUBCUTANEOUS
  Administered 2021-03-24 – 2021-03-25 (×3): 2 [IU] via SUBCUTANEOUS
  Administered 2021-03-25 (×2): 3 [IU] via SUBCUTANEOUS
  Administered 2021-03-26: 2 [IU] via SUBCUTANEOUS
  Administered 2021-03-26: 3 [IU] via SUBCUTANEOUS
  Administered 2021-03-26: 2 [IU] via SUBCUTANEOUS
  Administered 2021-03-27: 3 [IU] via SUBCUTANEOUS
  Administered 2021-03-27 (×2): 2 [IU] via SUBCUTANEOUS
  Administered 2021-03-28 (×2): 3 [IU] via SUBCUTANEOUS
  Administered 2021-03-28: 2 [IU] via SUBCUTANEOUS
  Administered 2021-03-29: 3 [IU] via SUBCUTANEOUS
  Administered 2021-03-29 (×2): 2 [IU] via SUBCUTANEOUS
  Administered 2021-03-29: 3 [IU] via SUBCUTANEOUS
  Administered 2021-03-29 (×2): 2 [IU] via SUBCUTANEOUS
  Administered 2021-03-29: 3 [IU] via SUBCUTANEOUS
  Administered 2021-03-30: 2 [IU] via SUBCUTANEOUS
  Administered 2021-03-30 (×2): 3 [IU] via SUBCUTANEOUS
  Administered 2021-03-30: 2 [IU] via SUBCUTANEOUS
  Administered 2021-03-31: 5 [IU] via SUBCUTANEOUS
  Administered 2021-03-31 (×3): 3 [IU] via SUBCUTANEOUS
  Administered 2021-03-31: 2 [IU] via SUBCUTANEOUS
  Administered 2021-04-01: 3 [IU] via SUBCUTANEOUS
  Administered 2021-04-01: 5 [IU] via SUBCUTANEOUS
  Administered 2021-04-01: 3 [IU] via SUBCUTANEOUS
  Administered 2021-04-01: 5 [IU] via SUBCUTANEOUS
  Administered 2021-04-01: 2 [IU] via SUBCUTANEOUS
  Administered 2021-04-01 – 2021-04-02 (×2): 3 [IU] via SUBCUTANEOUS
  Administered 2021-04-02: 2 [IU] via SUBCUTANEOUS
  Administered 2021-04-02 (×3): 3 [IU] via SUBCUTANEOUS
  Administered 2021-04-02: 8 [IU] via SUBCUTANEOUS
  Administered 2021-04-03 (×2): 2 [IU] via SUBCUTANEOUS
  Administered 2021-04-03: 5 [IU] via SUBCUTANEOUS
  Filled 2021-03-01 (×118): qty 1

## 2021-03-01 MED ORDER — SODIUM CHLORIDE 0.9% FLUSH
10.0000 mL | Freq: Two times a day (BID) | INTRAVENOUS | Status: DC
  Administered 2021-03-01 – 2021-03-03 (×6): 10 mL
  Administered 2021-03-04 (×2): 30 mL
  Administered 2021-03-05 – 2021-03-07 (×5): 10 mL
  Administered 2021-03-07: 20 mL
  Administered 2021-03-08: 10 mL
  Administered 2021-03-08: 30 mL
  Administered 2021-03-09: 10 mL
  Administered 2021-03-09: 30 mL
  Administered 2021-03-10 – 2021-03-18 (×17): 10 mL
  Administered 2021-03-18 – 2021-03-19 (×2): 30 mL
  Administered 2021-03-19 – 2021-03-20 (×2): 10 mL
  Administered 2021-03-20: 30 mL
  Administered 2021-03-21 – 2021-03-23 (×6): 10 mL
  Administered 2021-03-24: 20 mL
  Administered 2021-03-25 – 2021-04-03 (×15): 10 mL

## 2021-03-01 MED ORDER — ENOXAPARIN SODIUM 80 MG/0.8ML IJ SOSY
0.5000 mg/kg | PREFILLED_SYRINGE | INTRAMUSCULAR | Status: DC
  Administered 2021-03-01 – 2021-03-04 (×4): 72.5 mg via SUBCUTANEOUS
  Filled 2021-03-01 (×4): qty 0.8

## 2021-03-01 NOTE — Progress Notes (Addendum)
NAME:  Eddie Chapman, MRN:  [id number], DOB:  03/05/1961, LOS: 6 ADMISSION DATE:  02/23/2021 Patient has a complex comorbid history including systolic CHF chronically, pulmonary hypertension, obstructive sleep apnea, chronic hypoxemia with respiratory failure, obesity hypoventilation syndrome with thoracic restriction, hormonal dysfunction with hypotestosteronism, dyslipidemia, metabolic syndrome, previous subarachnoid hemorrhage, history of traumatic thoracic spinal injury, depression.  Patient was admitted in January to the intensive care unit with altered mental status acute hypoxemic and hypercapnic respiratory failure with metabolic encephalopathy and left lower lobe severe pneumonia.  Throughout hospitalization he was noted to have lethargy with hypercapnic encephalopathy and became unresponsive a few times even postextubation while on medical floor.  Patient has never smoked in the past.  On this admission he is poorly responsive with hypercapnic hypoxemic respiratory failure     Significant Hospital Events: Including procedures, antibiotic start and stop dates in addition to other pertinent events     02/24/21-Patient responded well to mechanical ventilation with improvement in hypercapnia on ABG this am. Seems that he had THC induced apnea/hypopnea based on laboratory testing. There is bibasilar atelectasis induced severe hypoxemia, recruitment Metaneb therapy has been ordered. Plan to repeat CXR this afternoon with SBT today. Reviewed plan with RN and RT today.    02/25/21-patient was unable to pass SBT today. He was able to come off propofol with RASS-0 and is weaned down on levophed.    02/26/21-patient continues to require levophed, he has been difficult to wean. I have asked ID for consultation due to concern for sepsis possibly due to CAP. Unable to perform SBT today. Family at bedside.   02/27/21-patient on PRVC with heavy secretions, difficulty weaning with  high narcotic tolerance. Patient had suicide attempt with requirement for psych.  6/1 failing to wean from vent   Antibiotics Given (last 72 hours)    Date/Time Action Medication Dose Rate   02/26/21 1433 New Bag/Given   piperacillin-tazobactam (ZOSYN) IVPB 3.375 g 3.375 g 12.5 mL/hr   02/26/21 2154 New Bag/Given   piperacillin-tazobactam (ZOSYN) IVPB 3.375 g 3.375 g 12.5 mL/hr   02/27/21 0631 New Bag/Given   piperacillin-tazobactam (ZOSYN) IVPB 3.375 g 3.375 g 12.5 mL/hr   02/27/21 1358 New Bag/Given   piperacillin-tazobactam (ZOSYN) IVPB 3.375 g 3.375 g 12.5 mL/hr   02/27/21 2131 New Bag/Given   piperacillin-tazobactam (ZOSYN) IVPB 3.375 g 3.375 g 12.5 mL/hr   02/28/21 1043 New Bag/Given   Ampicillin-Sulbactam (UNASYN) 3 g in sodium chloride 0.9 % 100 mL IVPB 3 g 200 mL/hr   02/28/21 1616 New Bag/Given   Ampicillin-Sulbactam (UNASYN) 3 g in sodium chloride 0.9 % 100 mL IVPB 3 g 200 mL/hr   02/28/21 2315 New Bag/Given   Ampicillin-Sulbactam (UNASYN) 3 g in sodium chloride 0.9 % 100 mL IVPB 3 g 200 mL/hr   03/01/21 0406 New Bag/Given   Ampicillin-Sulbactam (UNASYN) 3 g in sodium chloride 0.9 % 100 mL IVPB 3 g 200 mL/hr          Interim History / Subjective:  Remains on vent Severe COPD Morbid obesity Failing weaning trials         Objective   Blood pressure (!) 99/57, pulse (!) 104, temperature 98.96 F (37.2 C), temperature source Esophageal, resp. rate (!) 7, height 5\' 10"  (1.778 m), weight (!) 146.7 kg, SpO2 91 %.    Vent Mode: PRVC FiO2 (%):  [35 %-40 %] 35 % Set Rate:  [15 bmp] 15 bmp Vt Set:  [550 mL] 550 mL PEEP:  [5 cmH20]  [place] Pressure:  [0 cmH20-22 cmH20] 22 cmH20   Intake/Output Summary (Last 24 hours) at 03/01/2021 0902 Last data filed at 03/01/2021 0800 Gross per 24 hour  Intake 3343.82 ml  Output 900 ml  Net 2443.82 ml   Filed Weights   02/27/21 0500 02/28/21 0500 03/01/21 0247  Weight: (!) 151.2 kg (!) 151.1 kg (!) 146.7 kg       REVIEW OF SYSTEMS  PATIENT IS UNABLE TO PROVIDE COMPLETE REVIEW OF SYSTEMS DUE TO SEVERE CRITICAL ILLNESS AND TOXIC METABOLIC ENCEPHALOPATHY   PHYSICAL EXAMINATION:  GENERAL:critically ill appearing, +resp distress HEAD: Normocephalic, atraumatic.  EYES: Pupils equal, round, reactive to light.  No scleral icterus.  MOUTH: Moist mucosal membrane. NECK: Supple. PULMONARY: +rhonchi, +wheezing CARDIOVASCULAR: S1 and S2. Regular rate and rhythm. No murmurs, rubs, or gallops.  GASTROINTESTINAL: Soft, nontender, -distended. Positive bowel sounds.  MUSCULOSKELETAL: No swelling, clubbing, or edema.  NEUROLOGIC: obtunded SKIN:intact,warm,dry   Labs/imaging that I havepersonally reviewed  (right click and "Reselect all SmartList Selections" daily)       ASSESSMENT AND PLAN SYNOPSIS   60 yo morbidly obese WM with acute and severe hypoxic and hypercapnic resp failure with CAP with severe toxic metabolic encephalopathy, failure to wean from vent     Severe ACUTE Hypoxic and Hypercapnic Respiratory Failure -continue Mechanical Ventilator support -continue Bronchodilator Therapy -Wean Fio2 and PEEP as tolerated -VAP/VENT bundle implementation Unable to wean from vent   SEVERE COPD EXACERBATION -continue IV steroids as prescribed -continue NEB THERAPY as prescribed -morphine as needed -wean fio2 as needed and tolerated    CARDIAC FAILURE-acute combined systolic/diastolic dysfunction -oxygen as needed -Lasix as tolerated -follow up cardiac enzymes as indicated   CARDIAC ICU monitoring   ACUTE KIDNEY INJURY/Renal Failure -continue Foley Catheter-assess need -Avoid nephrotoxic agents -Follow urine output, BMP -Ensure adequate renal perfusion, optimize oxygenation -Renal dose medications   NEUROLOGY Acute toxic metabolic encephalopathy, need for sedation Goal RASS -2 to -3   SEPTIC SHOCK -use vasopressors to keep MAP>65 as needed -follow ABG and  LA -follow up cultures -emperic ABX -consider stress dose steroids   INFECTIOUS DISEASE -continue antibiotics as prescribed -follow up cultures -follow up ID consultation  ENDO - ICU hypoglycemic\Hyperglycemia protocol -check FSBS per protocol   GI GI PROPHYLAXIS as indicated  NUTRITIONAL STATUS DIET-->TF's as tolerated Constipation protocol as indicated   ELECTROLYTES -follow labs as needed -replace as needed -pharmacy consultation and following     Best practice (right click and "Reselect all SmartList Selections" daily)  Diet:  Tube Feed  Pain/Anxiety/Delirium protocol (if indicated): Yes (RASS goal -2) VAP protocol (if indicated): Yes DVT prophylaxis: Subcutaneous Heparin GI prophylaxis: PPI Glucose control:  SSI Yes Central venous access:  Yes, and it is still needed Arterial line:  N/A Foley:  Yes, and it is still needed Mobility:  bed rest  PT consulted: N/A Code Status:  full code Disposition: ICU     Labs   CBC: Recent Labs  Lab 02/23/21 1246 02/24/21 0447 02/25/21 0407 02/26/21 0337 02/27/21 0353  WBC 18.6* 18.1* 17.4* 12.0* 10.7*  NEUTROABS 14.9*  --   --   --   --   HGB 16.9 15.8 14.9 14.1 13.7  HCT 52.3* 47.4 46.0 44.3 42.6  MCV 100.8* 96.7 100.7* 102.5* 101.2*  PLT 179 196 152 137* 145*    Basic Metabolic Panel: Recent Labs  Lab 02/25/21 0407 02/26/21 0337 02/27/21 0353 02/28/21 0343 03/01/21 0423  NA 141 139 138 137 139  K  4.1 5.0 4.4 4.6 4.7  CL 96* 98 97* 98 97*  CO2 33* 32 33* 32 33*  GLUCOSE 91 89 130* 149* 128*  BUN 36* 27* 25* 23 28*  CREATININE 1.47* 1.30* 1.05 0.87 0.85  CALCIUM 8.6* 8.4* 8.5* 8.7* 9.0  MG 2.5* 2.4 2.4 2.3 2.5*  PHOS 5.3* 4.3 3.8 2.8 3.4   GFR: Estimated Creatinine Clearance: 132.3 mL/min (by C-G formula based on SCr of 0.85 mg/dL). Recent Labs  Lab 02/23/21 1246 02/23/21 1446 02/24/21 0447 02/25/21 0407 02/26/21 0337 02/27/21 0350 02/27/21 0353  PROCALCITON  --   --   --   --   0.19 0.13  --   WBC 18.6*  --  18.1* 17.4* 12.0*  --  10.7*  LATICACIDVEN 1.5 1.7  --   --   --   --   --     Liver Function Tests: Recent Labs  Lab 02/23/21 1246  AST 30  ALT 20  ALKPHOS 54  BILITOT 1.2  PROT 7.7  ALBUMIN 4.1   No results for input(s): LIPASE, AMYLASE in the last 168 hours. No results for input(s): AMMONIA in the last 168 hours.  ABG    Component Value Date/Time   PHART 7.56 (H) 02/24/2021 0500   PCO2ART 42 02/24/2021 0500   PO2ART 64 (L) 02/24/2021 0500   HCO3 37.6 (H) 02/24/2021 0500   O2SAT 94.9 02/24/2021 0500      CBG: Recent Labs  Lab 02/28/21 1548 02/28/21 1935 02/28/21 2305 03/01/21 0358 03/01/21 0723  GLUCAP 142* 111* 123* 122* 140*    Allergies Allergies  Allergen Reactions  . Divalproex Sodium Other (See Comments)    Elevated liver enzymes  . Valproic Acid     unknown       DVT/GI PRX  assessed I Assessed the need for Labs I Assessed the need for Foley I Assessed the need for Central Venous Line Family Discussion when available I Assessed the need for Mobilization I made an Assessment of medications to be adjusted accordingly Safety Risk assessment completed  CASE DISCUSSED IN MULTIDISCIPLINARY ROUNDS WITH ICU TEAM     Critical Care Time devoted to patient care services described in this note is 60 minutes.  Critical care was necessary to treat or prevent imminent or life-threatening deterioration. Overall, patient is critically ill, prognosis is guarded.  Patient with Multiorgan failure and at high risk for cardiac arrest and death.    Corrin Parker, M.D.  [facility]  Medical Director [place] Director [facility] Cardio-Pulmonary Department

## 2021-03-01 NOTE — Progress Notes (Signed)
Peripherally Inserted Central Catheter Placement  The IV Nurse has discussed with the patient and/or persons authorized to consent for the patient, the purpose of this procedure and the potential benefits and risks involved with this procedure.  The benefits include less needle sticks, lab draws from the catheter, and the patient may be discharged home with the catheter. Risks include, but not limited to, infection, bleeding, blood clot (thrombus formation), and puncture of an artery; nerve damage and irregular heartbeat and possibility to perform a PICC exchange if needed/ordered by physician.  Alternatives to this procedure were also discussed.  Bard Power PICC patient education guide, fact sheet on infection prevention and patient information card has been provided to patient /or left at bedside. Girlfriend/POA at bedside gave consent.   PICC Placement Documentation  PICC Triple Lumen 03/01/21 PICC Right Brachial 46 cm 0 cm (Active)  Indication for Insertion or Continuance of Line Vasoactive infusions;Chronic illness with exacerbations (CF, Sickle Cell, etc.);Prolonged intravenous therapies 03/01/21 1154  Exposed Catheter (cm) 0 cm 03/01/21 1154  Site Assessment Clean;Dry;Intact 03/01/21 1154  Lumen #1 Status Flushed;Saline locked;Blood return noted 03/01/21 1154  Lumen #2 Status Flushed;Saline locked;Blood return noted 03/01/21 1154  Lumen #3 Status Flushed;Saline locked;Blood return noted 03/01/21 1154  Dressing Type Transparent 03/01/21 1154  Dressing Status Clean;Dry;Intact 03/01/21 1154  Antimicrobial disc in place? Yes 03/01/21 1154  Safety Lock Not Applicable 17/00/17 4944  Line Care Connections checked and tightened 03/01/21 1154  Line Adjustment (NICU/IV Team Only) No 03/01/21 1154  Dressing Intervention New dressing 03/01/21 1154  Dressing Change Due 03/08/21 03/01/21 1154       Rolena Infante 03/01/2021, 11:55 AM

## 2021-03-01 NOTE — Progress Notes (Signed)
Saundra Shelling RN notified via secure chat that the PICC is ready to use and to remove all PIV's and reasons to remove. Triple lumen placed per order.

## 2021-03-01 NOTE — Progress Notes (Signed)
GOALS OF CARE DISCUSSION  The Clinical status was relayed to family in detail. Girlfriend at bedside Eddie Chapman  Updated and notified of patients medical condition.    Patient remains unresponsive and will not open eyes to command.   Patient is having a weak cough and struggling to remove secretions.   Patient with increased WOB and using accessory muscles to breathe Explained to family course of therapy and the modalities  Patient with Progressive multiorgan failure with a very high probablity of a very minimal chance of meaningful recovery despite all aggressive and optimal medical therapy.  Family understands the situation. Patient failing to wean from vent Severe hypoxia, increased WOB   She has consented and agreed to DNR status  Family are satisfied with Plan of action and management. All questions answered  Additional CC time 35 mins   Eddie Chapman [name], M.D.  [facility]  Medical Director [place] Director [facility] Cardio-Pulmonary Department

## 2021-03-01 NOTE — Progress Notes (Signed)
Nutrition Follow-up  DOCUMENTATION CODES:  Morbid obesity  INTERVENTION:   Continue tube feeding via OGT:   Vital High Protein at 70 ml/h (1680 ml per day), Prosource TF 45 ml 1x/d  Free water flush - 78mL q4h  Provides 1720 kcal, 158 gm protein, 1585 ml free water daily (TF+free water)  NUTRITION DIAGNOSIS:  Increased nutrient needs related to acute illness as evidenced by estimated needs.  GOAL:  Provide needs based on ASPEN/SCCM guidelines  MONITOR:  TF tolerance,I & O's,Vent status,Labs,Weight trends  REASON FOR ASSESSMENT:  Consult Enteral/tube feeding initiation and management  ASSESSMENT:  Pt presented to the ED complaining of shortness of breath worsening for several days. When EMS arrived he was found to have O2 sats in the 70s on room air. Required intubation while in ED due to respiratory failure and admitted to ICU 5/27. PMH of hypertension, hyperlipidemia, PVD, CHF, GERD, and OSA. Admitted earlier this year (12/23-1/21) and required intubation.   Pt intubated and sedated at the time of visit. No visitors at bedside. Discussed in IDT rounds, tolerating TF well but no BM since admission. Suppository to be given this AM. RN reports bowel sounds are active. Reportedly pt has reported poor quality of life at home due to medical issues. Palliative care to meet with family to determine [place].    5/27 - Admitted to ICU, intubated   +OGT - confirmed with XR 5/27  Patient is currently intubated on ventilator support MV: 8.1 L/min Temp (24hrs), Avg:98.6 F (37 C), Min:95.72 F (35.4 C), Max:99.1 F (37.3 C)   Intake/Output Summary (Last 24 hours) at 03/01/2021 0859 Last data filed at 03/01/2021 0800 Gross per 24 hour  Intake 3536.84 ml  Output 900 ml  Net 2636.84 ml   Net IO Since Admission: 12,127.86 mL [03/01/21 0859]  Nutritionally Relevant Medications Scheduled Meds: . docusate  100 mg Per Tube BID  . pantoprazole (PROTONIX) IV  40 mg Intravenous Daily  .  polyethylene glycol  17 g Per Tube Daily   Continuous Infusions: . sodium chloride 5 mL/hr at 03/01/21 0800  . fentaNYL infusion INTRAVENOUS 215 mcg/hr (03/01/21 0800)  . midazolam 10 mg/hr (03/01/21 0854)  . norepinephrine (LEVOPHED) Adult infusion 6 mcg/min (03/01/21 0800)   PRN Meds: docusate sodium, polyethylene glycol  Labs reviewed:   BUN 28  Mg 2.5  SBG ranges from 111-145 mg/dL over the last 24 hours  NUTRITION - FOCUSED PHYSICAL EXAM: No deficits noted, adipose tissue and edema could be masking signs of depletion. Flowsheet Row Most Recent Value  Orbital Region No depletion  Upper Arm Region No depletion  Thoracic and Lumbar Region No depletion  Buccal Region No depletion  Temple Region No depletion  Clavicle Bone Region No depletion  Clavicle and Acromion Bone Region No depletion  Scapular Bone Region No depletion  Dorsal Hand No depletion  Patellar Region No depletion  Anterior Thigh Region No depletion  Posterior Calf Region No depletion  Edema (RD Assessment) Moderate  Hair Reviewed  Eyes Unable to assess  Mouth Unable to assess  Skin Reviewed  Nails Reviewed     Diet Order:   Diet Order            Diet NPO time specified  Diet effective now                EDUCATION NEEDS:  No education needs have been identified at this time  Skin:  Skin Assessment: Reviewed RN Assessment  Last BM:  unknown, PTA  Height:  Ht Readings from Last 1 Encounters:  02/25/21 5\' 10"  (1.778 m)   Weight:  Wt Readings from Last 1 Encounters:  03/01/21 (!) 146.7 kg    Ideal Body Weight:  75.5 kg  BMI:  Body mass index is 46.41 kg/m.  Estimated Nutritional Needs:   Kcal:  1600-2000 kcal/d (using ASPEN critical care guidelines (11-14))  Protein:  151-189 g/d (2-2.5g/kg IBW)  Fluid:  1.6-1.8 L/d   Ranell Patrick, RD, LDN Clinical Dietitian Pager on Amion

## 2021-03-01 NOTE — Progress Notes (Signed)
Pt responds to voice this evening. Followed some simple commands this morning but not following commands at this time. Purposeful movement in all extremities. New tubing hung for all gtts and linked to PICC line. PIVs removed. MD made aware of CBG levels trending up and new orders placed. PRN versed for agitation/ de-saturation. Voiding incontinent on bed pad and external catheter. OG intact, appears to be tolerating tube feeds at this time. Small liquid BM this afternoon with hyperactive bowel sounds.

## 2021-03-01 NOTE — Progress Notes (Signed)
Patient maxed on Fentanyl and Versed gtts. Scooted himself to the end of the bed, but not following any commands. Eyes open without tracking. Versed bolus given as ordered PRN. Tube feedings placed on hold for aspiration prevention. Patient cleaned and repositioned in bed. Condom cath applied. NP made aware. Will continue to monitor.

## 2021-03-02 ENCOUNTER — Inpatient Hospital Stay: Payer: PPO

## 2021-03-02 DIAGNOSIS — I5020 Unspecified systolic (congestive) heart failure: Secondary | ICD-10-CM | POA: Diagnosis not present

## 2021-03-02 DIAGNOSIS — J9811 Atelectasis: Secondary | ICD-10-CM | POA: Diagnosis not present

## 2021-03-02 DIAGNOSIS — Z9911 Dependence on respirator [ventilator] status: Secondary | ICD-10-CM | POA: Diagnosis not present

## 2021-03-02 DIAGNOSIS — E662 Morbid (severe) obesity with alveolar hypoventilation: Secondary | ICD-10-CM | POA: Diagnosis not present

## 2021-03-02 DIAGNOSIS — J90 Pleural effusion, not elsewhere classified: Secondary | ICD-10-CM | POA: Diagnosis not present

## 2021-03-02 DIAGNOSIS — J9601 Acute respiratory failure with hypoxia: Secondary | ICD-10-CM | POA: Diagnosis not present

## 2021-03-02 DIAGNOSIS — I517 Cardiomegaly: Secondary | ICD-10-CM | POA: Diagnosis not present

## 2021-03-02 DIAGNOSIS — I5023 Acute on chronic systolic (congestive) heart failure: Secondary | ICD-10-CM | POA: Diagnosis not present

## 2021-03-02 DIAGNOSIS — J9602 Acute respiratory failure with hypercapnia: Secondary | ICD-10-CM | POA: Diagnosis not present

## 2021-03-02 DIAGNOSIS — G9341 Metabolic encephalopathy: Secondary | ICD-10-CM | POA: Diagnosis not present

## 2021-03-02 LAB — GLUCOSE, CAPILLARY
Glucose-Capillary: 115 mg/dL — ABNORMAL HIGH (ref 70–99)
Glucose-Capillary: 133 mg/dL — ABNORMAL HIGH (ref 70–99)
Glucose-Capillary: 143 mg/dL — ABNORMAL HIGH (ref 70–99)
Glucose-Capillary: 145 mg/dL — ABNORMAL HIGH (ref 70–99)
Glucose-Capillary: 149 mg/dL — ABNORMAL HIGH (ref 70–99)
Glucose-Capillary: 151 mg/dL — ABNORMAL HIGH (ref 70–99)
Glucose-Capillary: 151 mg/dL — ABNORMAL HIGH (ref 70–99)

## 2021-03-02 LAB — CULTURE, RESPIRATORY W GRAM STAIN

## 2021-03-02 LAB — CBC
HCT: 39.3 % (ref 39.0–52.0)
Hemoglobin: 12.4 g/dL — ABNORMAL LOW (ref 13.0–17.0)
MCH: 32.6 pg (ref 26.0–34.0)
MCHC: 31.6 g/dL (ref 30.0–36.0)
MCV: 103.4 fL — ABNORMAL HIGH (ref 80.0–100.0)
Platelets: 156 10*3/uL (ref 150–400)
RBC: 3.8 MIL/uL — ABNORMAL LOW (ref 4.22–5.81)
RDW: 14.3 % (ref 11.5–15.5)
WBC: 11.4 10*3/uL — ABNORMAL HIGH (ref 4.0–10.5)
nRBC: 0 % (ref 0.0–0.2)

## 2021-03-02 LAB — HEMOGLOBIN A1C
Hgb A1c MFr Bld: 5.9 % — ABNORMAL HIGH (ref 4.8–5.6)
Mean Plasma Glucose: 123 mg/dL

## 2021-03-02 LAB — PROCALCITONIN: Procalcitonin: <0.1 ng/mL

## 2021-03-02 MED ORDER — FENTANYL BOLUS VIA INFUSION
100.0000 ug | Freq: Once | INTRAVENOUS | Status: AC
  Administered 2021-03-02: 100 ug via INTRAVENOUS
  Filled 2021-03-02: qty 100

## 2021-03-02 MED ORDER — FUROSEMIDE 10 MG/ML IJ SOLN
40.0000 mg | Freq: Once | INTRAMUSCULAR | Status: AC
  Administered 2021-03-02: 40 mg via INTRAVENOUS
  Filled 2021-03-02: qty 4

## 2021-03-02 MED ORDER — MIDAZOLAM BOLUS VIA INFUSION
4.0000 mg | Freq: Once | INTRAVENOUS | Status: AC
  Administered 2021-03-02: 4 mg via INTRAVENOUS
  Filled 2021-03-02: qty 4

## 2021-03-02 MED ORDER — VECURONIUM BROMIDE 10 MG IV SOLR
20.0000 mg | Freq: Once | INTRAVENOUS | Status: AC
  Administered 2021-03-02: 20 mg via INTRAVENOUS

## 2021-03-02 MED ORDER — PANTOPRAZOLE SODIUM 40 MG PO PACK
40.0000 mg | PACK | Freq: Every day | ORAL | Status: DC
  Administered 2021-03-03 – 2021-03-12 (×10): 40 mg
  Filled 2021-03-02 (×10): qty 20

## 2021-03-02 MED ORDER — SODIUM CHLORIDE 0.9 % IV SOLN
3.0000 g | Freq: Four times a day (QID) | INTRAVENOUS | Status: AC
  Administered 2021-03-02 – 2021-03-09 (×28): 3 g via INTRAVENOUS
  Filled 2021-03-02: qty 8
  Filled 2021-03-02 (×13): qty 3
  Filled 2021-03-02: qty 8
  Filled 2021-03-02 (×3): qty 3
  Filled 2021-03-02: qty 8
  Filled 2021-03-02 (×2): qty 3
  Filled 2021-03-02 (×3): qty 8
  Filled 2021-03-02: qty 3
  Filled 2021-03-02: qty 8
  Filled 2021-03-02 (×3): qty 3

## 2021-03-02 MED ORDER — VECURONIUM BROMIDE 10 MG IV SOLR
INTRAVENOUS | Status: AC
  Filled 2021-03-02: qty 20

## 2021-03-02 MED ORDER — MIDAZOLAM BOLUS VIA INFUSION
2.0000 mg | Freq: Once | INTRAVENOUS | Status: AC
  Administered 2021-03-02: 2 mg via INTRAVENOUS
  Filled 2021-03-02: qty 2

## 2021-03-02 NOTE — Procedures (Signed)
Intubation Procedure Note  DERRIK MCEACHERN  [id number]  Aug 31, 1961  Date:03/02/21  Time:11:37 AM   Provider Performing:Danielly Ackerley D Dewaine Conger    Procedure: Intubation (58527)  Indication(s) Respiratory Failure  Consent Risks of the procedure as well as the alternatives and risks of each were explained to the patient and/or caregiver.  Consent for the procedure was obtained and is signed in the bedside chart   Anesthesia Versed, Fentanyl and vecuronium   Time Out Verified patient identification, verified procedure, site/side was marked, verified correct patient position, special equipment/implants available, medications/allergies/relevant history reviewed, required imaging and test results available.   Sterile Technique Usual hand hygeine, masks, and gloves were used   Procedure Description Patient positioned in bed supine.  Sedation given as noted above.  Patient was intubated with endotracheal tube using Glidescope.  View was Grade 1 full glottis .  Number of attempts was 1.  Colorimetric CO2 detector was consistent with tracheal placement.   Complications/Tolerance None; patient tolerated the procedure well. Chest X-ray is ordered to verify placement.   EBL Minimal   Specimen(s) None  ETT was exchanged out due to severe cuff leak.  Tube was exchanged using bougie.  Tube was secured @ 25 cm at lip.    Darel Hong, AGACNP-BC [facility] Prefer epic messenger for cross cover needs If after hours, please call E-link

## 2021-03-02 NOTE — Plan of Care (Signed)
  Problem: Health Behavior/Discharge Planning: Goal: Ability to manage health-related needs will improve Outcome: Not Progressing   Problem: Clinical Measurements: Goal: Ability to maintain clinical measurements within normal limits will improve Outcome: Not Progressing Goal: Will remain free from infection Outcome: Not Progressing Goal: Diagnostic test results will improve Outcome: Not Progressing Goal: Respiratory complications will improve Outcome: Not Progressing Goal: Cardiovascular complication will be avoided Outcome: Not Progressing   Problem: Activity: Goal: Risk for activity intolerance will decrease Outcome: Not Progressing   Problem: Coping: Goal: Level of anxiety will decrease Outcome: Not Progressing   Problem: Elimination: Goal: Will not experience complications related to bowel motility Outcome: Not Progressing   Problem: Pain Managment: Goal: General experience of comfort will improve Outcome: Not Progressing

## 2021-03-02 NOTE — Progress Notes (Addendum)
NAME:  Eddie Chapman, MRN:  [id number], DOB:  1961/02/25, LOS: 7 ADMISSION DATE:  02/23/2021 Patient has a complex comorbid history including systolic CHF chronically, pulmonary hypertension, obstructive sleep apnea, chronic hypoxemia with respiratory failure, obesity hypoventilation syndrome with thoracic restriction, hormonal dysfunction with hypotestosteronism, dyslipidemia, metabolic syndrome, previous subarachnoid hemorrhage, history of traumatic thoracic spinal injury, depression.  Patient was admitted in January to the intensive care unit with altered mental status acute hypoxemic and hypercapnic respiratory failure with metabolic encephalopathy and left lower lobe severe pneumonia.  Throughout hospitalization he was noted to have lethargy with hypercapnic encephalopathy and became unresponsive a few times even postextubation while on medical floor.  Patient has never smoked in the past.  On this admission he is poorly responsive with hypercapnic hypoxemic respiratory failure     Significant Hospital Events: Including procedures, antibiotic start and stop dates in addition to other pertinent events    02/24/21-Patient responded well to mechanical ventilation with improvement in hypercapnia on ABG this am. Seems that he had THC induced apnea/hypopnea based on laboratory testing. There is bibasilar atelectasis induced severe hypoxemia, recruitment Metaneb therapy has been ordered. Plan to repeat CXR this afternoon with SBT today. Reviewed plan with RN and RT today.    02/25/21-patient was unable to pass SBT today. He was able to come off propofol with RASS-0 and is weaned down on levophed.    02/26/21-patient continues to require levophed, he has been difficult to wean. I have asked ID for consultation due to concern for sepsis possibly due to CAP. Unable to perform SBT today. Family at bedside.   02/27/21-patient on PRVC with heavy secretions, difficulty weaning with  high narcotic tolerance. Patient had suicide attempt with requirement for psych.  6/1 failing to wean from vent, DNR status 6/2 failed to wean from vent, severe hypoxia   Antibiotics Given (last 72 hours)    Date/Time Action Medication Dose Rate   02/27/21 1358 New Bag/Given   piperacillin-tazobactam (ZOSYN) IVPB 3.375 g 3.375 g 12.5 mL/hr   02/27/21 2131 New Bag/Given   piperacillin-tazobactam (ZOSYN) IVPB 3.375 g 3.375 g 12.5 mL/hr   02/28/21 1043 New Bag/Given   Ampicillin-Sulbactam (UNASYN) 3 g in sodium chloride 0.9 % 100 mL IVPB 3 g 200 mL/hr   02/28/21 1616 New Bag/Given   Ampicillin-Sulbactam (UNASYN) 3 g in sodium chloride 0.9 % 100 mL IVPB 3 g 200 mL/hr   02/28/21 2315 New Bag/Given   Ampicillin-Sulbactam (UNASYN) 3 g in sodium chloride 0.9 % 100 mL IVPB 3 g 200 mL/hr   03/01/21 0406 New Bag/Given   Ampicillin-Sulbactam (UNASYN) 3 g in sodium chloride 0.9 % 100 mL IVPB 3 g 200 mL/hr   03/01/21 1248 New Bag/Given   Ampicillin-Sulbactam (UNASYN) 3 g in sodium chloride 0.9 % 100 mL IVPB 3 g 200 mL/hr   03/01/21 1606 New Bag/Given   Ampicillin-Sulbactam (UNASYN) 3 g in sodium chloride 0.9 % 100 mL IVPB 3 g 200 mL/hr        Interim History / Subjective:  Remains on vent Severe hypoxia Failure to wean from vent DNR status       Objective   Blood pressure 103/63, pulse 65, temperature 97.7 F (36.5 C), temperature source Esophageal, resp. rate 15, height '5\' 10"'  (1.778 m), weight (!) 157.3 kg, SpO2 93 %.    Vent Mode: PRVC FiO2 (%):  [35 %-45 %] 45 % Set Rate:  [15 bmp] 15 bmp Vt Set:  [550 mL] 550 mL PEEP:  [5  cmH20] 5 cmH20 Plateau Pressure:  [20 cmH20-22 cmH20] 20 cmH20   Intake/Output Summary (Last 24 hours) at 03/02/2021 0723 Last data filed at 03/02/2021 0000 Gross per 24 hour  Intake 2873.66 ml  Output 350 ml  Net 2523.66 ml   Filed Weights   02/28/21 0500 03/01/21 0247 03/02/21 0435  Weight: (!) 151.1 kg (!) 146.7 kg (!) 157.3 kg      REVIEW OF  SYSTEMS  PATIENT IS UNABLE TO PROVIDE COMPLETE REVIEW OF SYSTEMS DUE TO SEVERE CRITICAL ILLNESS AND TOXIC METABOLIC ENCEPHALOPATHY   PHYSICAL EXAMINATION:  GENERAL:critically ill appearing, +resp distress HEAD: Normocephalic, atraumatic.  EYES: Pupils equal, round, reactive to light.  No scleral icterus.  MOUTH: Moist mucosal membrane. NECK: Supple. PULMONARY: +rhonchi, +wheezing CARDIOVASCULAR: S1 and S2. Regular rate and rhythm. No murmurs, rubs, or gallops.  GASTROINTESTINAL: Soft, nontender, -distended. Positive bowel sounds.  MUSCULOSKELETAL: No swelling, clubbing, or edema.  NEUROLOGIC: obtunded SKIN:intact,warm,dry   Labs/imaging that I havepersonally reviewed  (right click and "Reselect all SmartList Selections" daily)      ASSESSMENT AND PLAN SYNOPSIS   60 yo morbidly obese WM with acute and severe hypoxic and hypercapnic resp failure with CAP with severe toxic metabolic encephalopathy, failure to wean from vent   Severe ACUTE Hypoxic and Hypercapnic Respiratory Failure -continue Mechanical Ventilator support -continue Bronchodilator Therapy -Wean Fio2 and PEEP as tolerated -VAP/VENT bundle implementation -will perform SAT/SBT when respiratory parameters are met   SEVERE COPD EXACERBATION -continue IV steroids as prescribed -continue NEB THERAPY as prescribed -morphine as needed -wean fio2 as needed and tolerated    CARDIAC  -oxygen as needed -Lasix as tolerated -follow up cardiac enzymes as indicated   CARDIAC ICU monitoring   ACUTE KIDNEY INJURY/Renal Failure -continue Foley Catheter-assess need -Avoid nephrotoxic agents -Follow urine output, BMP -Ensure adequate renal perfusion, optimize oxygenation -Renal dose medications   NEUROLOGY Acute toxic metabolic encephalopathy, need for sedation Goal RASS -2 to -3   SEPTIC SHOCK -use vasopressors to keep MAP>65 as needed -follow ABG and LA -follow up cultures -emperic ABX  INFECTIOUS  DISEASE Completed ABX -follow up cultures -follow up ID consultation  ENDO - ICU hypoglycemic\Hyperglycemia protocol -check FSBS per protocol   GI GI PROPHYLAXIS as indicated  NUTRITIONAL STATUS DIET-->TF's as tolerated Constipation protocol as indicated   ELECTROLYTES -follow labs as needed -replace as needed -pharmacy consultation and following     Best practice (right click and "Reselect all SmartList Selections" daily)  Diet:Tube Feed Pain/Anxiety/Delirium protocol (if indicated):Yes(RASS goal -2) VAP protocol (if indicated):Yes DVT prophylaxis:Subcutaneous Heparin GI prophylaxis:PPI Glucose control:SSIYes Central venous access:Yes, and it is still needed Arterial line:N/A Foley:Yes, and it is still needed Mobility:bed rest PT consulted:N/A Code Status:full code Disposition:ICU   Labs   CBC: Recent Labs  Lab 02/23/21 1246 02/24/21 0447 02/25/21 0407 02/26/21 0337 02/27/21 0353  WBC 18.6* 18.1* 17.4* 12.0* 10.7*  NEUTROABS 14.9*  --   --   --   --   HGB 16.9 15.8 14.9 14.1 13.7  HCT 52.3* 47.4 46.0 44.3 42.6  MCV 100.8* 96.7 100.7* 102.5* 101.2*  PLT 179 196 152 137* 145*    Basic Metabolic Panel: Recent Labs  Lab 02/25/21 0407 02/26/21 0337 02/27/21 0353 02/28/21 0343 03/01/21 0423  NA 141 139 138 137 139  K 4.1 5.0 4.4 4.6 4.7  CL 96* 98 97* 98 97*  CO2 33* 32 33* 32 33*  GLUCOSE 91 89 130* 149* 128*  BUN 36* 27* 25* 23 28*  CREATININE  1.47* 1.30* 1.05 0.87 0.85  CALCIUM 8.6* 8.4* 8.5* 8.7* 9.0  MG 2.5* 2.4 2.4 2.3 2.5*  PHOS 5.3* 4.3 3.8 2.8 3.4   GFR: Estimated Creatinine Clearance: 137.7 mL/min (by C-G formula based on SCr of 0.85 mg/dL). Recent Labs  Lab 02/23/21 1246 02/23/21 1446 02/24/21 0447 02/25/21 0407 02/26/21 0337 02/27/21 0350 02/27/21 0353  PROCALCITON  --   --   --   --  0.19 0.13  --   WBC 18.6*  --  18.1* 17.4* 12.0*  --  10.7*  LATICACIDVEN 1.5 1.7  --   --   --   --   --      Liver Function Tests: Recent Labs  Lab 02/23/21 1246  AST 30  ALT 20  ALKPHOS 54  BILITOT 1.2  PROT 7.7  ALBUMIN 4.1   No results for input(s): LIPASE, AMYLASE in the last 168 hours. No results for input(s): AMMONIA in the last 168 hours.  ABG    Component Value Date/Time   PHART 7.56 (H) 02/24/2021 0500   PCO2ART 42 02/24/2021 0500   PO2ART 64 (L) 02/24/2021 0500   HCO3 37.6 (H) 02/24/2021 0500   O2SAT 94.9 02/24/2021 0500     Coagulation Profile: No results for input(s): INR, PROTIME in the last 168 hours.  Cardiac Enzymes: No results for input(s): CKTOTAL, CKMB, CKMBINDEX, TROPONINI in the last 168 hours.  HbA1C: No results found for: HGBA1C  CBG: Recent Labs  Lab 03/01/21 1238 03/01/21 1523 03/01/21 1943 03/02/21 0044 03/02/21 0352  GLUCAP 145* 179* 133* 151* 151*    Allergies Allergies  Allergen Reactions  . Divalproex Sodium Other (See Comments)    Elevated liver enzymes  . Valproic Acid     unknown       DVT/GI PRX  assessed I Assessed the need for Labs I Assessed the need for Foley I Assessed the need for Central Venous Line Family Discussion when available I Assessed the need for Mobilization I made an Assessment of medications to be adjusted accordingly Safety Risk assessment completed  CASE DISCUSSED IN MULTIDISCIPLINARY ROUNDS WITH ICU TEAM     Critical Care Time devoted to patient care services described in this note is 56  minutes.  Critical care was necessary to treat or prevent imminent or life-threatening deterioration. Overall, patient is critically ill, prognosis is guarded.  Patient with Multiorgan failure and at high risk for cardiac arrest and death.    Corrin Parker, M.D.  [facility]  Medical Director [place] Director [facility] Cardio-Pulmonary Department

## 2021-03-02 NOTE — Consult Note (Signed)
Mackey, Varricchio [id number] 06/05/1961  Reason for Consult: Evaluate for tracheostomy Requesting Physician:  Flora Lipps, MD   HPI: The patient is a 60 year old white male with a comorbid history including systolic congestive heart failure, pulmonary hypertension, obstructive sleep apnea, chronic hypoxemia with respiratory failure, obesity hypoventilation syndrome with thoracic restriction, metabolic syndrome, previous subarachnoid hemorrhage, history of traumatic thoracic spinal injury, and depression, who was admitted in January for intense care unit with altered mental status and acute hypoxemia and hypercapnic respiratory failure with metabolic encephalopathy and left lower lobe pneumonia.  He was noted to have lethargy with hypercapnic encephalopathy even after extubation while on the medical floor.  He was readmitted this time 5/27 with hypercapnic hypoxemic respiratory failure.  He was intubated.  This apparently was brought on by [facility] induced apnea hypopnea on laboratory testing.  He has been unable to be weaned and currently has DNR status.  Discussions were made with his girlfriend today and consent has been given for tracheostomy, although there may be some consideration for just comfort care.  Patient was seen for evaluation for tracheostomy.  ROS:  Negative except as in HPI.  Past Medical History:  Diagnosis Date  . Adenomatous colon polyp   . Depression   . Ear drum perforation    left x2   . GERD (gastroesophageal reflux disease)   . Hyperlipidemia   . Hypertension   . T8 vertebral fracture (HCC)     Allergies  Allergen Reactions  . Divalproex Sodium Other (See Comments)    Elevated liver enzymes  . Valproic Acid     unknown    Scheduled Medications: . budesonide (PULMICORT) nebulizer solution  0.25 mg Nebulization BID  . chlorhexidine gluconate (MEDLINE KIT)  15 mL Mouth Rinse BID  . Chlorhexidine Gluconate Cloth  6 each Topical Daily  . docusate  100 mg Per Tube BID  .  enoxaparin (LOVENOX) injection  0.5 mg/kg Subcutaneous Q24H  . feeding supplement (PROSource TF)  45 mL Per Tube Daily  . feeding supplement (VITAL HIGH PROTEIN)  1,000 mL Per Tube Q24H  . free water  30 mL Per Tube Q4H  . hydrocortisone sod succinate (SOLU-CORTEF) inj  100 mg Intravenous BID  . insulin aspart  0-15 Units Subcutaneous Q4H  . ipratropium-albuterol  3 mL Nebulization Q4H  . mouth rinse  15 mL Mouth Rinse 10 times per day  . midodrine  10 mg Per Tube TID WC  . [START ON 03/03/2021] pantoprazole sodium  40 mg Per Tube Daily  . polyethylene glycol  17 g Per Tube Daily  . sodium chloride flush  10-40 mL Intracatheter Q12H    PRN Meds: bisacodyl, docusate sodium, fentaNYL, ipratropium-albuterol, midazolam, polyethylene glycol, sodium chloride flush  Infusions: . sodium chloride Stopped (03/02/21 1821)  . ampicillin-sulbactam (UNASYN) IV 200 mL/hr at 03/02/21 1844  . fentaNYL infusion INTRAVENOUS 325 mcg/hr (03/02/21 1844)  . midazolam 10 mg/hr (03/02/21 1844)  . norepinephrine (LEVOPHED) Adult infusion 3 mcg/min (03/02/21 1844)     Physical Exam:  Vitals:   03/02/21 1830 03/02/21 1915  BP: (!) 82/59   Pulse: 96   Resp: (!) 24   Temp: 97.88 F (36.6 C)   SpO2: 93% 94%    Patient is unresponsive in the ICU.  He has a very thick white face and thick neck with almost no length.  His chin basically sits down on his sternum.  It very difficult to palpate any of the neck landmarks because of the size of his  neck.  Patient is on the ventilator on assist control.  IMPRESSION: Patient is vent dependent and not likely to wean anytime soon.  He has encephalopathy as well.  He has had multiple intubations and is unlikely to be freed from the ventilator anytime soon.  If he is removed to any kind of rehabilitation a tracheostomy will be necessary.  This will be a very difficult procedure because of his anatomy and thick short neck.  PLAN:   We reviewed the surgery schedule and try  to find date to consider this.  I do believe the earliest would be Wednesday morning depending on the surgery schedule.  On Monday we can reconfirm with the girlfriend her wishes to proceed with the tracheostomy and not consider comfort care only.  Once I get a time slot for surgery I could notify the ICU and attendings so that we can proceed with this next week.    Huey Romans 03/02/2021, 7:35 PM

## 2021-03-02 NOTE — Progress Notes (Signed)
GOALS OF CARE DISCUSSION  The Clinical status was relayed to family in detail. Girlfriend at bedside HCPOA  Updated and notified of patients medical condition.    Patient remains unresponsive and will not open eyes to command.   Patient is having a weak cough and struggling to remove secretions.   Patient with increased WOB and using accessory muscles to breathe Explained to family course of therapy and the modalities    Patient with Progressive multiorgan failure with a very high probablity of a very minimal chance of meaningful recovery despite all aggressive and optimal medical therapy.  Patient remains DNR status  Patient with cuff leak-will need to change ETT  Patient with severe OSA and upper airway edema and difficult intubation with failure to wean from vent, patient would definitely benefit from [facility]. HCPOA HAS CONSENTED AND AGREED TO PURSUE [facility] I will consult ENT  Family understands the situation.   Family are satisfied with Plan of action and management. All questions answered  Additional CC time 35 mins   Legaci Tarman [name], M.D.  [facility]  Medical Director [place] Director [facility] Cardio-Pulmonary Department

## 2021-03-02 NOTE — Progress Notes (Signed)
PHARMACIST - PHYSICIAN COMMUNICATION  CONCERNING: IV to Oral Route Change Policy  RECOMMENDATION: This patient is receiving pantoprazole by the intravenous route.  Based on criteria approved by the Pharmacy and Therapeutics Committee, the intravenous medication(s) is/are being converted to the equivalent oral dose form(s).   DESCRIPTION: These criteria include:  The patient is eating (either orally or via tube) and/or has been taking other orally administered medications for a least 24 hours  The patient has no evidence of active gastrointestinal bleeding or impaired GI absorption (gastrectomy, short bowel, patient on TNA or NPO).  If you have questions about this conversion, please contact the [place], [facility] 03/02/2021 9:06 AM

## 2021-03-03 DIAGNOSIS — J9602 Acute respiratory failure with hypercapnia: Secondary | ICD-10-CM | POA: Diagnosis not present

## 2021-03-03 DIAGNOSIS — E662 Morbid (severe) obesity with alveolar hypoventilation: Secondary | ICD-10-CM | POA: Diagnosis not present

## 2021-03-03 DIAGNOSIS — J9601 Acute respiratory failure with hypoxia: Secondary | ICD-10-CM | POA: Diagnosis not present

## 2021-03-03 DIAGNOSIS — I5023 Acute on chronic systolic (congestive) heart failure: Secondary | ICD-10-CM | POA: Diagnosis not present

## 2021-03-03 LAB — MAGNESIUM: Magnesium: 2.3 mg/dL (ref 1.7–2.4)

## 2021-03-03 LAB — CBC
HCT: 34.3 % — ABNORMAL LOW (ref 39.0–52.0)
Hemoglobin: 10.6 g/dL — ABNORMAL LOW (ref 13.0–17.0)
MCH: 32.2 pg (ref 26.0–34.0)
MCHC: 30.9 g/dL (ref 30.0–36.0)
MCV: 104.3 fL — ABNORMAL HIGH (ref 80.0–100.0)
Platelets: 150 10*3/uL (ref 150–400)
RBC: 3.29 MIL/uL — ABNORMAL LOW (ref 4.22–5.81)
RDW: 14.3 % (ref 11.5–15.5)
WBC: 9.6 10*3/uL (ref 4.0–10.5)
nRBC: 0 % (ref 0.0–0.2)

## 2021-03-03 LAB — GLUCOSE, CAPILLARY
Glucose-Capillary: 117 mg/dL — ABNORMAL HIGH (ref 70–99)
Glucose-Capillary: 119 mg/dL — ABNORMAL HIGH (ref 70–99)
Glucose-Capillary: 122 mg/dL — ABNORMAL HIGH (ref 70–99)
Glucose-Capillary: 125 mg/dL — ABNORMAL HIGH (ref 70–99)
Glucose-Capillary: 128 mg/dL — ABNORMAL HIGH (ref 70–99)
Glucose-Capillary: 147 mg/dL — ABNORMAL HIGH (ref 70–99)
Glucose-Capillary: 148 mg/dL — ABNORMAL HIGH (ref 70–99)

## 2021-03-03 LAB — BASIC METABOLIC PANEL
Anion gap: 9 (ref 5–15)
BUN: 43 mg/dL — ABNORMAL HIGH (ref 8–23)
CO2: 31 mmol/L (ref 22–32)
Calcium: 8.2 mg/dL — ABNORMAL LOW (ref 8.9–10.3)
Chloride: 98 mmol/L (ref 98–111)
Creatinine, Ser: 1.07 mg/dL (ref 0.61–1.24)
GFR, Estimated: >60 mL/min (ref >60)
Glucose, Bld: 193 mg/dL — ABNORMAL HIGH (ref 70–99)
Potassium: 4.8 mmol/L (ref 3.5–5.1)
Sodium: 138 mmol/L (ref 135–145)

## 2021-03-03 LAB — PHOSPHORUS: Phosphorus: 4.4 mg/dL (ref 2.5–4.6)

## 2021-03-03 NOTE — Progress Notes (Addendum)
NAME:  Eddie Chapman, MRN:  [id number], DOB:  Mar 12, 1961, LOS: 8 ADMISSION DATE:  02/23/2021  BRIEF SYNOPSIS Patient has a complex comorbid history including systolic CHF chronically, pulmonary hypertension, obstructive sleep apnea, chronic hypoxemia with respiratory failure, obesity hypoventilation syndrome with thoracic restriction, hormonal dysfunction with hypotestosteronism, dyslipidemia, metabolic syndrome, previous subarachnoid hemorrhage, history of traumatic thoracic spinal injury, depression.  Patient was admitted in January to the intensive care unit with altered mental status acute hypoxemic and hypercapnic respiratory failure with metabolic encephalopathy and left lower lobe severe pneumonia.  Throughout hospitalization he was noted to have lethargy with hypercapnic encephalopathy and became unresponsive a few times even postextubation while on medical floor.  Patient has never smoked in the past.  On this admission he is poorly responsive with hypercapnic hypoxemic respiratory failure     Significant Hospital Events: Including procedures, antibiotic start and stop dates in addition to other pertinent events    02/24/21-Patient responded well to mechanical ventilation with improvement in hypercapnia on ABG this am. Seems that he had THC induced apnea/hypopnea based on laboratory testing. There is bibasilar atelectasis induced severe hypoxemia, recruitment Metaneb therapy has been ordered. Plan to repeat CXR this afternoon with SBT today. Reviewed plan with RN and RT today.    02/25/21-patient was unable to pass SBT today. He was able to come off propofol with RASS-0 and is weaned down on levophed.    02/26/21-patient continues to require levophed, he has been difficult to wean. I have asked ID for consultation due to concern for sepsis possibly due to CAP. Unable to perform SBT today. Family at bedside.   02/27/21-patient on PRVC with heavy secretions,  difficulty weaning with high narcotic tolerance. Patient had suicide attempt with requirement for psych.  6/1 failing to wean from vent, DNR status 6/2 failed to wean from vent, severe hypoxia 6/3 severe hypoxia, failure to wean from vent, needs [facility], ENT consulted   MICRO DATA 5/31 SPUTUM CULTURE  ACINETOBACTER CALCOACETICUS/BAUMANNII COMPLEX     Antibiotics Given (last 72 hours)    Date/Time Action Medication Dose Rate   02/28/21 1043 New Bag/Given   Ampicillin-Sulbactam (UNASYN) 3 g in sodium chloride 0.9 % 100 mL IVPB 3 g 200 mL/hr   02/28/21 1616 New Bag/Given   Ampicillin-Sulbactam (UNASYN) 3 g in sodium chloride 0.9 % 100 mL IVPB 3 g 200 mL/hr   02/28/21 2315 New Bag/Given   Ampicillin-Sulbactam (UNASYN) 3 g in sodium chloride 0.9 % 100 mL IVPB 3 g 200 mL/hr   03/01/21 0406 New Bag/Given   Ampicillin-Sulbactam (UNASYN) 3 g in sodium chloride 0.9 % 100 mL IVPB 3 g 200 mL/hr   03/01/21 1248 New Bag/Given   Ampicillin-Sulbactam (UNASYN) 3 g in sodium chloride 0.9 % 100 mL IVPB 3 g 200 mL/hr   03/01/21 1606 New Bag/Given   Ampicillin-Sulbactam (UNASYN) 3 g in sodium chloride 0.9 % 100 mL IVPB 3 g 200 mL/hr   03/02/21 1821 New Bag/Given   Ampicillin-Sulbactam (UNASYN) 3 g in sodium chloride 0.9 % 100 mL IVPB 3 g 200 mL/hr   03/02/21 2347 New Bag/Given   Ampicillin-Sulbactam (UNASYN) 3 g in sodium chloride 0.9 % 100 mL IVPB 3 g 200 mL/hr   03/03/21 0504 New Bag/Given   Ampicillin-Sulbactam (UNASYN) 3 g in sodium chloride 0.9 % 100 mL IVPB 3 g 200 mL/hr      Interim History / Subjective:  Remains critically ill Severe hypoxia Severe resp failure        Objective  Blood pressure (!) 99/57, pulse 73, temperature 97.9 F (36.6 C), resp. rate 15, height 5\' 10"  (1.778 m), weight (!) 151.3 kg, SpO2 95 %.    Vent Mode: PRVC FiO2 (%):  [35 %-60 %] 55 % Set Rate:  [15 bmp] 15 bmp Vt Set:  [550 mL] 550 mL PEEP:  [5 cmH20-10 cmH20] 10 cmH20 Plateau Pressure:  [17  cmH20] 17 cmH20   Intake/Output Summary (Last 24 hours) at 03/03/2021 0732 Last data filed at 03/03/2021 0600 Gross per 24 hour  Intake 4059.66 ml  Output 1900 ml  Net 2159.66 ml   Filed Weights   03/01/21 0247 03/02/21 0435 03/03/21 0341  Weight: (!) 146.7 kg (!) 157.3 kg (!) 151.3 kg      REVIEW OF SYSTEMS  PATIENT IS UNABLE TO PROVIDE COMPLETE REVIEW OF SYSTEMS DUE TO SEVERE CRITICAL ILLNESS AND TOXIC METABOLIC ENCEPHALOPATHY   PHYSICAL EXAMINATION:  GENERAL:critically ill appearing, +resp distress HEAD: Normocephalic, atraumatic.  EYES: Pupils equal, round, reactive to light.  No scleral icterus.  MOUTH: Moist mucosal membrane. NECK: Supple. PULMONARY: +rhonchi, +wheezing CARDIOVASCULAR: S1 and S2. Regular rate and rhythm. No murmurs, rubs, or gallops.  GASTROINTESTINAL: Soft, nontender, -distended. Positive bowel sounds.  MUSCULOSKELETAL: No swelling, clubbing, or edema.  NEUROLOGIC: obtunded SKIN:intact,warm,dry   Labs/imaging that I havepersonally reviewed  (right click and "Reselect all SmartList Selections" daily)      ASSESSMENT AND PLAN SYNOPSIS    60 yo morbidly obese WM with acute and severe hypoxic and hypercapnic resp failure with CAP with severe toxic metabolic encephalopathy, failure to wean from vent +ACIBNOTBACTER SPECIES PNEUMONIA   Severe ACUTE Hypoxic and Hypercapnic Respiratory Failure -continue Mechanical Ventilator support -continue Bronchodilator Therapy -Wean Fio2 and PEEP as tolerated -VAP/VENT bundle implementation -will NOT perform SAT/SBT  Unable to wean from vent ENT CONSULTED FOR TRACH  SEVERE COPD EXACERBATION -continue IV steroids as prescribed -continue NEB THERAPY as prescribed -morphine as needed -wean fio2 as needed and tolerated    CARDIAC ICU monitoring   ACUTE KIDNEY INJURY/Renal Failure -continue Foley Catheter-assess need -Avoid nephrotoxic agents -Follow urine output, BMP -Ensure adequate renal  perfusion, optimize oxygenation -Renal dose medications   NEUROLOGY Acute toxic metabolic encephalopathy, need for sedation Goal RASS -2 to -3   SEPTIC SHOCK -use vasopressors to keep MAP>65 as needed   INFECTIOUS DISEASE   ACINETOBACTER CALCOACETICUS/BAUMANNII COMPLEX  PNEUMONIA -continue antibiotics as prescribed -follow up cultures -follow up ID consultation  ENDO - ICU hypoglycemic\Hyperglycemia protocol -check FSBS per protocol   GI GI PROPHYLAXIS as indicated  NUTRITIONAL STATUS DIET-->TF's as tolerated Constipation protocol as indicated   ELECTROLYTES -follow labs as needed -replace as needed -pharmacy consultation and following     Best practice (right click and "Reselect all SmartList Selections" daily)  Diet:Tube Feed Pain/Anxiety/Delirium protocol (if indicated):Yes(RASS goal -2) VAP protocol (if indicated):Yes DVT prophylaxis:Subcutaneous Heparin GI prophylaxis:PPI Glucose control:SSIYes Central venous access:Yes, and it is still needed Arterial line:N/A Foley:Yes, and it is still needed Mobility:bed rest PT consulted:N/A Code Status:full code Disposition:ICU     Labs   CBC: Recent Labs  Lab 02/25/21 0407 02/26/21 0337 02/27/21 0353 03/02/21 1021 03/03/21 0322  WBC 17.4* 12.0* 10.7* 11.4* 9.6  HGB 14.9 14.1 13.7 12.4* 10.6*  HCT 46.0 44.3 42.6 39.3 34.3*  MCV 100.7* 102.5* 101.2* 103.4* 104.3*  PLT 152 137* 145* 156 272    Basic Metabolic Panel: Recent Labs  Lab 02/26/21 0337 02/27/21 0353 02/28/21 0343 03/01/21 0423 03/03/21 0322  NA 139 138 137 139  138  K 5.0 4.4 4.6 4.7 4.8  CL 98 97* 98 97* 98  CO2 32 33* 32 33* 31  GLUCOSE 89 130* 149* 128* 193*  BUN 27* 25* 23 28* 43*  CREATININE 1.30* 1.05 0.87 0.85 1.07  CALCIUM 8.4* 8.5* 8.7* 9.0 8.2*  MG 2.4 2.4 2.3 2.5* 2.3  PHOS 4.3 3.8 2.8 3.4 4.4   GFR: Estimated Creatinine Clearance: 107 mL/min (by C-G formula based on SCr of 1.07  mg/dL). Recent Labs  Lab 02/26/21 0337 02/27/21 0350 02/27/21 0353 03/02/21 1021 03/03/21 0322  PROCALCITON 0.19 0.13  --  <0.10  --   WBC 12.0*  --  10.7* 11.4* 9.6    Liver Function Tests: No results for input(s): AST, ALT, ALKPHOS, BILITOT, PROT, ALBUMIN in the last 168 hours. No results for input(s): LIPASE, AMYLASE in the last 168 hours. No results for input(s): AMMONIA in the last 168 hours.  ABG    Component Value Date/Time   PHART 7.40 03/02/2021 2000   PCO2ART 69 (HH) 03/02/2021 2000   PO2ART 137 (H) 03/02/2021 2000   HCO3 42.7 (H) 03/02/2021 2000   O2SAT 99.1 03/02/2021 2000     Coagulation Profile: No results for input(s): INR, PROTIME in the last 168 hours.  Cardiac Enzymes: No results for input(s): CKTOTAL, CKMB, CKMBINDEX, TROPONINI in the last 168 hours.  HbA1C: Hgb A1c MFr Bld  Date/Time Value Ref Range Status  03/02/2021 04:52 AM 5.9 (H) 4.8 - 5.6 % Final    Comment:    (NOTE)         Prediabetes: 5.7 - 6.4         Diabetes: >6.4         Glycemic control for adults with diabetes: <7.0     CBG: Recent Labs  Lab 03/02/21 1135 03/02/21 1609 03/02/21 1927 03/02/21 2327 03/03/21 0327  GLUCAP 149* 145* 115* 133* 147*    Allergies Allergies  Allergen Reactions  . Divalproex Sodium Other (See Comments)    Elevated liver enzymes  . Valproic Acid     unknown       DVT/GI PRX  assessed I Assessed the need for Labs I Assessed the need for Foley I Assessed the need for Central Venous Line Family Discussion when available I Assessed the need for Mobilization I made an Assessment of medications to be adjusted accordingly Safety Risk assessment completed  CASE DISCUSSED IN MULTIDISCIPLINARY ROUNDS WITH ICU TEAM     Critical Care Time devoted to patient care services described in this note is 65  minutes.  Critical care was necessary to treat or prevent imminent or life-threatening deterioration. Overall, patient is critically ill,  prognosis is guarded.  Patient with Multiorgan failure and at high risk for cardiac arrest and death.   PATIENT WILL NEED TRACH FOR SURVIVAL  Robson Trickey [name], M.D.  [facility]  Medical Director [place] Director [facility] Cardio-Pulmonary Department

## 2021-03-04 DIAGNOSIS — E662 Morbid (severe) obesity with alveolar hypoventilation: Secondary | ICD-10-CM | POA: Diagnosis not present

## 2021-03-04 DIAGNOSIS — I5023 Acute on chronic systolic (congestive) heart failure: Secondary | ICD-10-CM | POA: Diagnosis not present

## 2021-03-04 DIAGNOSIS — J9601 Acute respiratory failure with hypoxia: Secondary | ICD-10-CM | POA: Diagnosis not present

## 2021-03-04 DIAGNOSIS — J9602 Acute respiratory failure with hypercapnia: Secondary | ICD-10-CM | POA: Diagnosis not present

## 2021-03-04 LAB — CBC WITH DIFFERENTIAL/PLATELET
Abs Immature Granulocytes: 0.05 10*3/uL (ref 0.00–0.07)
Basophils Absolute: 0 10*3/uL (ref 0.0–0.1)
Basophils Relative: 0 %
Eosinophils Absolute: 0 10*3/uL (ref 0.0–0.5)
Eosinophils Relative: 0 %
HCT: 34.5 % — ABNORMAL LOW (ref 39.0–52.0)
Hemoglobin: 10.7 g/dL — ABNORMAL LOW (ref 13.0–17.0)
Immature Granulocytes: 1 %
Lymphocytes Relative: 8 %
Lymphs Abs: 0.7 10*3/uL (ref 0.7–4.0)
MCH: 32.6 pg (ref 26.0–34.0)
MCHC: 31 g/dL (ref 30.0–36.0)
MCV: 105.2 fL — ABNORMAL HIGH (ref 80.0–100.0)
Monocytes Absolute: 1 10*3/uL (ref 0.1–1.0)
Monocytes Relative: 11 %
Neutro Abs: 6.9 10*3/uL (ref 1.7–7.7)
Neutrophils Relative %: 80 %
Platelets: 149 10*3/uL — ABNORMAL LOW (ref 150–400)
RBC: 3.28 MIL/uL — ABNORMAL LOW (ref 4.22–5.81)
RDW: 13.9 % (ref 11.5–15.5)
WBC: 8.6 10*3/uL (ref 4.0–10.5)
nRBC: 0 % (ref 0.0–0.2)

## 2021-03-04 LAB — GLUCOSE, CAPILLARY
Glucose-Capillary: 137 mg/dL — ABNORMAL HIGH (ref 70–99)
Glucose-Capillary: 125 mg/dL — ABNORMAL HIGH (ref 70–99)
Glucose-Capillary: 87 mg/dL (ref 70–99)
Glucose-Capillary: 135 mg/dL — ABNORMAL HIGH (ref 70–99)
Glucose-Capillary: 110 mg/dL — ABNORMAL HIGH (ref 70–99)
Glucose-Capillary: 94 mg/dL (ref 70–99)

## 2021-03-04 LAB — BASIC METABOLIC PANEL
Anion gap: 7 (ref 5–15)
BUN: 55 mg/dL — ABNORMAL HIGH (ref 8–23)
CO2: 33 mmol/L — ABNORMAL HIGH (ref 22–32)
Calcium: 8.4 mg/dL — ABNORMAL LOW (ref 8.9–10.3)
Chloride: 101 mmol/L (ref 98–111)
Creatinine, Ser: 1.21 mg/dL (ref 0.61–1.24)
GFR, Estimated: >60 mL/min (ref >60)
Glucose, Bld: 208 mg/dL — ABNORMAL HIGH (ref 70–99)
Potassium: 4.1 mmol/L (ref 3.5–5.1)
Sodium: 141 mmol/L (ref 135–145)

## 2021-03-04 LAB — PHOSPHORUS: Phosphorus: 4.2 mg/dL (ref 2.5–4.6)

## 2021-03-04 LAB — MAGNESIUM: Magnesium: 2.4 mg/dL (ref 1.7–2.4)

## 2021-03-04 MED ORDER — MIDAZOLAM 50MG/50ML (1MG/ML) PREMIX INFUSION
0.5000 mg/h | INTRAVENOUS | Status: DC
  Administered 2021-03-04 – 2021-03-05 (×8): 12 mg/h via INTRAVENOUS
  Administered 2021-03-05: 10 mg/h via INTRAVENOUS
  Administered 2021-03-06: 13 mg/h via INTRAVENOUS
  Administered 2021-03-06: 14 mg/h via INTRAVENOUS
  Administered 2021-03-06: 13 mg/h via INTRAVENOUS
  Administered 2021-03-06: 14 mg/h via INTRAVENOUS
  Administered 2021-03-06: 13 mg/h via INTRAVENOUS
  Administered 2021-03-07 (×4): 14 mg/h via INTRAVENOUS
  Administered 2021-03-07: 16 mg/h via INTRAVENOUS
  Administered 2021-03-07: 18 mg/h via INTRAVENOUS
  Administered 2021-03-07 – 2021-03-08 (×3): 14 mg/h via INTRAVENOUS
  Filled 2021-03-04 (×24): qty 50

## 2021-03-04 NOTE — Plan of Care (Signed)
  Problem: Clinical Measurements: Goal: Will remain free from infection Outcome: Progressing Goal: Diagnostic test results will improve Outcome: Progressing Goal: Cardiovascular complication will be avoided Outcome: Progressing   Problem: Elimination: Goal: Will not experience complications related to bowel motility Outcome: Progressing   Problem: Pain Managment: Goal: General experience of comfort will improve Outcome: Progressing   Problem: Health Behavior/Discharge Planning: Goal: Ability to manage health-related needs will improve Outcome: Not Progressing   Problem: Clinical Measurements: Goal: Ability to maintain clinical measurements within normal limits will improve Outcome: Not Progressing Goal: Respiratory complications will improve Outcome: Not Progressing   Problem: Activity: Goal: Risk for activity intolerance will decrease Outcome: Not Progressing   Problem: Coping: Goal: Level of anxiety will decrease Outcome: Not Progressing

## 2021-03-04 NOTE — Progress Notes (Signed)
Bedside report received from [place], RN and patient care accepted. Patient is alert at time of transition of care on 10mg  Versed and 452mcg Fentanyl, not fighting the ventilator at this time. VSS. Patient following all commands and denies pain. No acute assessment findings. Will continue to monitor.

## 2021-03-04 NOTE — Progress Notes (Addendum)
NAME:  Eddie Chapman, MRN:  [id number], DOB:  [birth year], LOS: 9 ADMISSION DATE:  02/23/2021 Patient has a complex comorbid history including systolic CHF chronically, pulmonary hypertension, obstructive sleep apnea, chronic hypoxemia with respiratory failure, obesity hypoventilation syndrome with thoracic restriction, hormonal dysfunction with hypotestosteronism, dyslipidemia, metabolic syndrome, previous subarachnoid hemorrhage, history of traumatic thoracic spinal injury, depression.  Patient was admitted in January to the intensive care unit with altered mental status acute hypoxemic and hypercapnic respiratory failure with metabolic encephalopathy and left lower lobe severe pneumonia.  Throughout hospitalization he was noted to have lethargy with hypercapnic encephalopathy and became unresponsive a few times even postextubation while on medical floor.  Patient has never smoked in the past.  On this admission he is poorly responsive with hypercapnic hypoxemic respiratory failure     Significant Hospital Events: Including procedures, antibiotic start and stop dates in addition to other pertinent events    02/24/21-Patient responded well to mechanical ventilation with improvement in hypercapnia on ABG this am. Seems that he had THC induced apnea/hypopnea based on laboratory testing. There is bibasilar atelectasis induced severe hypoxemia, recruitment Metaneb therapy has been ordered. Plan to repeat CXR this afternoon with SBT today. Reviewed plan with RN and RT today.    02/25/21-patient was unable to pass SBT today. He was able to come off propofol with RASS-0 and is weaned down on levophed.    02/26/21-patient continues to require levophed, he has been difficult to wean. I have asked ID for consultation due to concern for sepsis possibly due to CAP. Unable to perform SBT today. Family at bedside.   02/27/21-patient on PRVC with heavy secretions, difficulty weaning with  high narcotic tolerance. Patient had suicide attempt with requirement for psych.  6/1 failing to wean from vent, DNR status 6/2 failed to wean from vent, severe hypoxia 6/3 severe hypoxia, failure to wean from vent, needs [facility], ENT consulted 6/4 failure to wean from    MICRO DATA 5/31 SPUTUM CULTURE  ACINETOBACTER CALCOACETICUS/BAUMANNII COMPLEX    Antibiotics Given (last 72 hours)    Date/Time Action Medication Dose Rate   03/01/21 1248 New Bag/Given   Ampicillin-Sulbactam (UNASYN) 3 g in sodium chloride 0.9 % 100 mL IVPB 3 g 200 mL/hr   03/01/21 1606 New Bag/Given   Ampicillin-Sulbactam (UNASYN) 3 g in sodium chloride 0.9 % 100 mL IVPB 3 g 200 mL/hr   03/02/21 1821 New Bag/Given   Ampicillin-Sulbactam (UNASYN) 3 g in sodium chloride 0.9 % 100 mL IVPB 3 g 200 mL/hr   03/02/21 2347 New Bag/Given   Ampicillin-Sulbactam (UNASYN) 3 g in sodium chloride 0.9 % 100 mL IVPB 3 g 200 mL/hr   03/03/21 0504 New Bag/Given   Ampicillin-Sulbactam (UNASYN) 3 g in sodium chloride 0.9 % 100 mL IVPB 3 g 200 mL/hr   03/03/21 1143 New Bag/Given   Ampicillin-Sulbactam (UNASYN) 3 g in sodium chloride 0.9 % 100 mL IVPB 3 g 200 mL/hr   03/03/21 1742 New Bag/Given   Ampicillin-Sulbactam (UNASYN) 3 g in sodium chloride 0.9 % 100 mL IVPB 3 g 200 mL/hr   03/03/21 2305 New Bag/Given   Ampicillin-Sulbactam (UNASYN) 3 g in sodium chloride 0.9 % 100 mL IVPB 3 g 200 mL/hr   03/04/21 0539 New Bag/Given   Ampicillin-Sulbactam (UNASYN) 3 g in sodium chloride 0.9 % 100 mL IVPB 3 g 200 mL/hr        Interim History / Subjective:  Remains on vent Remains critically ill Severe OSA and COPD  Objective   Blood pressure 130/80, pulse 94, temperature (!) 97.5 F (36.4 C), resp. rate 15, height 5\' 10"  (1.778 m), weight (!) 155.7 kg, SpO2 97 %.    Vent Mode: PRVC FiO2 (%):  [55 %] 55 % Set Rate:  [15 bmp] 15 bmp Vt Set:  [550 mL] 550 mL PEEP:  [10 cmH20] 10 cmH20 Plateau Pressure:  [21 cmH20-23  cmH20] 21 cmH20   Intake/Output Summary (Last 24 hours) at 03/04/2021 0714 Last data filed at 03/04/2021 0500 Gross per 24 hour  Intake 4032.07 ml  Output 1975 ml  Net 2057.07 ml   Filed Weights   03/02/21 0435 03/03/21 0341 03/04/21 0500  Weight: (!) 157.3 kg (!) 151.3 kg (!) 155.7 kg      REVIEW OF SYSTEMS  PATIENT IS UNABLE TO PROVIDE COMPLETE REVIEW OF SYSTEMS DUE TO SEVERE CRITICAL ILLNESS AND TOXIC METABOLIC ENCEPHALOPATHY   PHYSICAL EXAMINATION:  GENERAL:critically ill appearing, +resp distress HEAD: Normocephalic, atraumatic.  EYES: Pupils equal, round, reactive to light.  No scleral icterus.  MOUTH: Moist mucosal membrane. NECK: Supple. PULMONARY: +rhonchi, +wheezing CARDIOVASCULAR: S1 and S2. Regular rate and rhythm. No murmurs, rubs, or gallops.  GASTROINTESTINAL: Soft, nontender, -distended. Positive bowel sounds.  MUSCULOSKELETAL: No swelling, clubbing, or edema.  NEUROLOGIC: obtunded SKIN:intact,warm,dry   Labs/imaging that I havepersonally reviewed  (right click and "Reselect all SmartList Selections" daily)       ASSESSMENT AND PLAN SYNOPSIS    60 yo morbidly obese WM with acute and severe hypoxic and hypercapnic resp failure with CAP with severe toxic metabolic encephalopathy, failure to wean from vent +ACIBNOTBACTER SPECIES PNEUMONIA    Severe ACUTE Hypoxic and Hypercapnic Respiratory Failure -continue Mechanical Ventilator support -continue Bronchodilator Therapy -Wean Fio2 and PEEP as tolerated -VAP/VENT bundle implementation Failure to wean from vent, needs TRACH tosurvive  SEVERE COPD EXACERBATION -continue IV steroids as prescribed -continue NEB THERAPY as prescribed    CARDIAC ICU monitoring   ACUTE KIDNEY INJURY/Renal Failure -continue Foley Catheter-assess need -Avoid nephrotoxic agents -Follow urine output, BMP -Ensure adequate renal perfusion, optimize oxygenation -Renal dose medications   NEUROLOGY Acute toxic  metabolic encephalopathy, need for sedation Goal RASS -2 to -3   SEPTIC SHOCK -use vasopressors to keep MAP>65 as needed   INFECTIOUS DISEASE ACINETOBACTER CALCOACETICUS/BAUMANNII COMPLEX  PNEUMONIA -continue antibiotics as prescribed    ENDO - ICU hypoglycemic\Hyperglycemia protocol -check FSBS per protocol   GI GI PROPHYLAXIS as indicated  NUTRITIONAL STATUS DIET-->TF's as tolerated Constipation protocol as indicated   ELECTROLYTES -follow labs as needed -replace as needed -pharmacy consultation and following     Best practice (right click and "Reselect all SmartList Selections" daily)  Diet:Tube Feed Pain/Anxiety/Delirium protocol (if indicated):Yes(RASS goal -2) VAP protocol (if indicated):Yes DVT prophylaxis:Subcutaneous Heparin GI prophylaxis:PPI Glucose control:SSIYes Central venous access:Yes, and it is still needed Arterial line:N/A Foley:Yes, and it is still needed Mobility:bed rest PT consulted:N/A Code Status:full code Disposition:ICU    Labs   CBC: Recent Labs  Lab 02/26/21 0337 02/27/21 0353 03/02/21 1021 03/03/21 0322 03/04/21 0431  WBC 12.0* 10.7* 11.4* 9.6 8.6  NEUTROABS  --   --   --   --  6.9  HGB 14.1 13.7 12.4* 10.6* 10.7*  HCT 44.3 42.6 39.3 34.3* 34.5*  MCV 102.5* 101.2* 103.4* 104.3* 105.2*  PLT 137* 145* 156 150 149*    Basic Metabolic Panel: Recent Labs  Lab 02/27/21 0353 02/28/21 0343 03/01/21 0423 03/03/21 0322 03/04/21 0431  NA 138 137 139 138 141  K 4.4  4.6 4.7 4.8 4.1  CL 97* 98 97* 98 101  CO2 33* 32 33* 31 33*  GLUCOSE 130* 149* 128* 193* 208*  BUN 25* 23 28* 43* 55*  CREATININE 1.05 0.87 0.85 1.07 1.21  CALCIUM 8.5* 8.7* 9.0 8.2* 8.4*  MG 2.4 2.3 2.5* 2.3 2.4  PHOS 3.8 2.8 3.4 4.4 4.2   GFR: Estimated Creatinine Clearance: 96.2 mL/min (by C-G formula based on SCr of 1.21 mg/dL). Recent Labs  Lab 02/26/21 0337 02/27/21 0350 02/27/21 0353 03/02/21 1021  03/03/21 0322 03/04/21 0431  PROCALCITON 0.19 0.13  --  <0.10  --   --   WBC 12.0*  --  10.7* 11.4* 9.6 8.6    Liver Function Tests: No results for input(s): AST, ALT, ALKPHOS, BILITOT, PROT, ALBUMIN in the last 168 hours. No results for input(s): LIPASE, AMYLASE in the last 168 hours. No results for input(s): AMMONIA in the last 168 hours.  ABG    Component Value Date/Time   PHART 7.40 03/02/2021 2000   PCO2ART 69 (HH) 03/02/2021 2000   PO2ART 137 (H) 03/02/2021 2000   HCO3 42.7 (H) 03/02/2021 2000   O2SAT 99.1 03/02/2021 2000     Coagulation Profile: No results for input(s): INR, PROTIME in the last 168 hours.  Cardiac Enzymes: No results for input(s): CKTOTAL, CKMB, CKMBINDEX, TROPONINI in the last 168 hours.  HbA1C: Hgb A1c MFr Bld  Date/Time Value Ref Range Status  03/02/2021 04:52 AM 5.9 (H) 4.8 - 5.6 % Final    Comment:    (NOTE)         Prediabetes: 5.7 - 6.4         Diabetes: >6.4         Glycemic control for adults with diabetes: <7.0     CBG: Recent Labs  Lab 03/03/21 1624 03/03/21 1934 03/03/21 2307 03/03/21 2335 03/04/21 0358  GLUCAP 148* 125* 119* 122* 137*    Allergies Allergies  Allergen Reactions  . Divalproex Sodium Other (See Comments)    Elevated liver enzymes  . Valproic Acid     unknown       DVT/GI PRX  assessed I Assessed the need for Labs I Assessed the need for Foley I Assessed the need for Central Venous Line Family Discussion when available I Assessed the need for Mobilization I made an Assessment of medications to be adjusted accordingly Safety Risk assessment completed     Critical Care Time devoted to patient care services described in this note is 55 minutes.  Critical care was necessary to treat or prevent imminent or life-threatening deterioration. Overall, patient is critically ill, prognosis is guarded.  Patient with Multiorgan failure and at high risk for cardiac arrest and death.    Corrin Parker,  M.D.  [facility]  Medical Director [place] Director [facility] Cardio-Pulmonary Department

## 2021-03-04 NOTE — Progress Notes (Signed)
GOALS OF CARE DISCUSSION  The Clinical status was relayed to family in detail. GF at bedside HCPOA  Updated and notified of patients medical condition.    Patient remains unresponsive and will not open eyes to command.   Patient is having a weak cough and struggling to remove secretions.   Patient with increased WOB and using accessory muscles to breathe Explained to family course of therapy and the modalities    Patient with Progressive multiorgan failure with a very high probablity of a very minimal chance of meaningful recovery despite all aggressive and optimal medical therapy.  PATIENT REMAINS DNR status  I have explained that trach will definitely be very beneficial to patient He will require it indefinitely  unless he loses significant amount of weight, patient will need to breathe with it, speakl with it and eat with with and will need to rehab with therapists and time, I have explained that this process mat take weeks to months  Family are satisfied with Plan of action and management. All questions answered  Additional CC time 35 mins   Armine Rizzolo [name], M.D.  [facility]  Medical Director [place] Director [facility] Cardio-Pulmonary Department

## 2021-03-05 DIAGNOSIS — J9601 Acute respiratory failure with hypoxia: Secondary | ICD-10-CM | POA: Diagnosis not present

## 2021-03-05 DIAGNOSIS — J9602 Acute respiratory failure with hypercapnia: Secondary | ICD-10-CM | POA: Diagnosis not present

## 2021-03-05 DIAGNOSIS — E662 Morbid (severe) obesity with alveolar hypoventilation: Secondary | ICD-10-CM | POA: Diagnosis not present

## 2021-03-05 DIAGNOSIS — I5023 Acute on chronic systolic (congestive) heart failure: Secondary | ICD-10-CM | POA: Diagnosis not present

## 2021-03-05 LAB — CBC
HCT: 34.4 % — ABNORMAL LOW (ref 39.0–52.0)
Hemoglobin: 10.6 g/dL — ABNORMAL LOW (ref 13.0–17.0)
MCH: 32.6 pg (ref 26.0–34.0)
MCHC: 30.8 g/dL (ref 30.0–36.0)
MCV: 105.8 fL — ABNORMAL HIGH (ref 80.0–100.0)
Platelets: 150 10*3/uL (ref 150–400)
RBC: 3.25 MIL/uL — ABNORMAL LOW (ref 4.22–5.81)
RDW: 14.1 % (ref 11.5–15.5)
WBC: 8.8 10*3/uL (ref 4.0–10.5)
nRBC: 0 % (ref 0.0–0.2)

## 2021-03-05 LAB — GLUCOSE, CAPILLARY
Glucose-Capillary: 115 mg/dL — ABNORMAL HIGH (ref 70–99)
Glucose-Capillary: 117 mg/dL — ABNORMAL HIGH (ref 70–99)
Glucose-Capillary: 116 mg/dL — ABNORMAL HIGH (ref 70–99)
Glucose-Capillary: 126 mg/dL — ABNORMAL HIGH (ref 70–99)
Glucose-Capillary: 130 mg/dL — ABNORMAL HIGH (ref 70–99)

## 2021-03-05 LAB — BLOOD GAS, ARTERIAL
Acid-Base Excess: 14.7 mmol/L — ABNORMAL HIGH (ref 0.0–2.0)
Bicarbonate: 42.7 mmol/L — ABNORMAL HIGH (ref 20.0–28.0)
FIO2: 0.6
MECHVT: 500 mL
O2 Saturation: 99.1 %
Patient temperature: 37
RATE: 15 resp/min
pCO2 arterial: 69 mmHg (ref 32.0–48.0)
pH, Arterial: 7.4 (ref 7.350–7.450)
pO2, Arterial: 137 mmHg — ABNORMAL HIGH (ref 83.0–108.0)

## 2021-03-05 LAB — BASIC METABOLIC PANEL
Anion gap: 8 (ref 5–15)
BUN: 60 mg/dL — ABNORMAL HIGH (ref 8–23)
CO2: 32 mmol/L (ref 22–32)
Calcium: 8.1 mg/dL — ABNORMAL LOW (ref 8.9–10.3)
Chloride: 104 mmol/L (ref 98–111)
Creatinine, Ser: 1.22 mg/dL (ref 0.61–1.24)
GFR, Estimated: >60 mL/min (ref >60)
Glucose, Bld: 106 mg/dL — ABNORMAL HIGH (ref 70–99)
Potassium: 3.9 mmol/L (ref 3.5–5.1)
Sodium: 144 mmol/L (ref 135–145)

## 2021-03-05 LAB — MAGNESIUM: Magnesium: 2.6 mg/dL — ABNORMAL HIGH (ref 1.7–2.4)

## 2021-03-05 LAB — PHOSPHORUS: Phosphorus: 3.9 mg/dL (ref 2.5–4.6)

## 2021-03-05 MED ORDER — ENOXAPARIN SODIUM 80 MG/0.8ML IJ SOSY
0.5000 mg/kg | PREFILLED_SYRINGE | INTRAMUSCULAR | Status: AC
  Administered 2021-03-05: 72.5 mg via SUBCUTANEOUS
  Filled 2021-03-05: qty 0.8

## 2021-03-05 NOTE — TOC Progression Note (Signed)
Transition of Care [facility]) - Progression Note    Patient Details  Name: Eddie Chapman MRN: [id number] Date of Birth: [birth year]  Transition of Care [facility]) CM/SW [place], [place] Phone Number: [phone] 03/05/2021, 3:27 PM  Clinical Narrative:     CSW spoke with patient's Baum,[name] (Significant other) [phone] about patient disposition.  CSW explained the recommendation was for [facility]  CSW went over the LTACHs in the area and where they were located.  Ms. Johny Blamer chose [facility] in [place].  CSW explained the placement process and estimated time. CSW stated I would contact rep for [facility] and would update her on Ms. Baum's decision.  CSW asked Ms. Johny Blamer if she wanted [facility] LT[facility] rep to begin insurance authorization.  Ms. Johny Blamer agreed to insurance auth process.  CSW explained timeline for insurance auth.  Ms. Johny Blamer verbalized understanding. CSW stated I would update Ms. Johny Blamer once I was updated on insurance auth.  CSW contacted Jenn at [place], and updated her on conversation with Ms. Christena Flake.       Expected Discharge Plan and Services                                                 Social Determinants of Health (SDOH) Interventions    Readmission Risk Interventions No flowsheet data found.

## 2021-03-05 NOTE — Progress Notes (Addendum)
NAME:  Eddie Chapman, MRN:  [id number], DOB:  Nov 10, 1960, LOS: 92 ADMISSION DATE:  02/23/2021 Patient has a complex comorbid history including systolic CHF chronically, pulmonary hypertension, obstructive sleep apnea, chronic hypoxemia with respiratory failure, obesity hypoventilation syndrome with thoracic restriction, hormonal dysfunction with hypotestosteronism, dyslipidemia, metabolic syndrome, previous subarachnoid hemorrhage, history of traumatic thoracic spinal injury, depression.  Patient was admitted in January to the intensive care unit with altered mental status acute hypoxemic and hypercapnic respiratory failure with metabolic encephalopathy and left lower lobe severe pneumonia.  Throughout hospitalization he was noted to have lethargy with hypercapnic encephalopathy and became unresponsive a few times even postextubation while on medical floor.  Patient has never smoked in the past.  On this admission he is poorly responsive with hypercapnic hypoxemic respiratory failure     Significant Hospital Events: Including procedures, antibiotic start and stop dates in addition to other pertinent events    02/24/21-Patient responded well to mechanical ventilation with improvement in hypercapnia on ABG this am. Seems that he had THC induced apnea/hypopnea based on laboratory testing. There is bibasilar atelectasis induced severe hypoxemia, recruitment Metaneb therapy has been ordered. Plan to repeat CXR this afternoon with SBT today. Reviewed plan with RN and RT today.    02/25/21-patient was unable to pass SBT today. He was able to come off propofol with RASS-0 and is weaned down on levophed.    02/26/21-patient continues to require levophed, he has been difficult to wean. I have asked ID for consultation due to concern for sepsis possibly due to CAP. Unable to perform SBT today. Family at bedside.   02/27/21-patient on PRVC with heavy secretions, difficulty weaning with  high narcotic tolerance. Patient had suicide attempt with requirement for psych.  6/1 failing to wean from vent, DNR status 6/2 failed to wean from vent, severe hypoxia 6/3 severe hypoxia, failure to wean from vent, needs [facility], ENT consulted 6/4 failure to wean from    MICRO DATA 5/31 SPUTUM CULTURE ACINETOBACTER CALCOACETICUS/BAUMANNII COMPLEX   Antibiotics Given (last 72 hours)    Date/Time Action Medication Dose Rate   03/02/21 1821 New Bag/Given   Ampicillin-Sulbactam (UNASYN) 3 g in sodium chloride 0.9 % 100 mL IVPB 3 g 200 mL/hr   03/02/21 2347 New Bag/Given   Ampicillin-Sulbactam (UNASYN) 3 g in sodium chloride 0.9 % 100 mL IVPB 3 g 200 mL/hr   03/03/21 0504 New Bag/Given   Ampicillin-Sulbactam (UNASYN) 3 g in sodium chloride 0.9 % 100 mL IVPB 3 g 200 mL/hr   03/03/21 1143 New Bag/Given   Ampicillin-Sulbactam (UNASYN) 3 g in sodium chloride 0.9 % 100 mL IVPB 3 g 200 mL/hr   03/03/21 1742 New Bag/Given   Ampicillin-Sulbactam (UNASYN) 3 g in sodium chloride 0.9 % 100 mL IVPB 3 g 200 mL/hr   03/03/21 2305 New Bag/Given   Ampicillin-Sulbactam (UNASYN) 3 g in sodium chloride 0.9 % 100 mL IVPB 3 g 200 mL/hr   03/04/21 0539 New Bag/Given   Ampicillin-Sulbactam (UNASYN) 3 g in sodium chloride 0.9 % 100 mL IVPB 3 g 200 mL/hr   03/04/21 1155 New Bag/Given   Ampicillin-Sulbactam (UNASYN) 3 g in sodium chloride 0.9 % 100 mL IVPB 3 g 200 mL/hr   03/04/21 1723 New Bag/Given   Ampicillin-Sulbactam (UNASYN) 3 g in sodium chloride 0.9 % 100 mL IVPB 3 g 200 mL/hr   03/04/21 2320 New Bag/Given   Ampicillin-Sulbactam (UNASYN) 3 g in sodium chloride 0.9 % 100 mL IVPB 3 g 200 mL/hr  03/05/21 0531 New Bag/Given   Ampicillin-Sulbactam (UNASYN) 3 g in sodium chloride 0.9 % 100 mL IVPB 3 g 200 mL/hr        Interim History / Subjective:  Remains on vent Remains critically ill Severe OSA and COPD      Objective   Blood pressure 112/71, pulse 90, temperature 97.88 F (36.6 C),  resp. rate 13, height 5\' 10"  (1.778 m), weight (!) 150.8 kg, SpO2 95 %.    Vent Mode: PRVC FiO2 (%):  [45 %] 45 % Set Rate:  [15 bmp] 15 bmp Vt Set:  [550 mL] 550 mL PEEP:  [10 cmH20] 10 cmH20 Plateau Pressure:  [21 cmH20] 21 cmH20   Intake/Output Summary (Last 24 hours) at 03/05/2021 0731 Last data filed at 03/05/2021 0600 Gross per 24 hour  Intake 3754.16 ml  Output 901 ml  Net 2853.16 ml   Filed Weights   03/03/21 0341 03/04/21 0500 03/05/21 0348  Weight: (!) 151.3 kg (!) 155.7 kg (!) 150.8 kg      REVIEW OF SYSTEMS  PATIENT IS UNABLE TO PROVIDE COMPLETE REVIEW OF SYSTEMS DUE TO SEVERE CRITICAL ILLNESS AND TOXIC METABOLIC ENCEPHALOPATHY   PHYSICAL EXAMINATION:  GENERAL:critically ill appearing, +resp distress NECK: Supple. PULMONARY: +rhonchi, +wheezing CARDIOVASCULAR: S1 and S2. Regular rate and rhythm. No murmurs, rubs, or gallops.  GASTROINTESTINAL: Soft, nontender, -distended. Positive bowel sounds.  MUSCULOSKELETAL: No swelling, clubbing, or edema.  NEUROLOGIC: obtunded SKIN:intact,warm,dry   Labs/imaging that I havepersonally reviewed  (right click and "Reselect all SmartList Selections" daily)     ASSESSMENT AND PLAN SYNOPSIS     60 yo morbidly obese WM with acute and severe hypoxic and hypercapnic resp failure with CAP with severe toxic metabolic encephalopathy, failure to wean from vent+ACINOTOBACTER SPECIES PNEUMONIA     Severe ACUTE Hypoxic and Hypercapnic Respiratory Failure -continue Mechanical Ventilator support -continue Bronchodilator Therapy -Wean Fio2 and PEEP as tolerated -VAP/VENT bundle implementation Failure to wean from vent  SEVERE COPD EXACERBATION -continue IV steroids as prescribed -continue NEB THERAPY as prescribed   CARDIAC ICU monitoring   ACUTE KIDNEY INJURY/Renal Failure -continue Foley Catheter-assess need -Avoid nephrotoxic agents -Follow urine output, BMP -Ensure adequate renal perfusion, optimize  oxygenation -Renal dose medications   NEUROLOGY Acute toxic metabolic encephalopathy, need for sedation Goal RASS -2 to -3   SEPTIC SHOCK -use vasopressors to keep MAP>65 as needed   INFECTIOUS DISEASE ACINETOBACTER CALCOACETICUS/BAUMANNII COMPLEX PNEUMONIA -continue antibiotics as prescribed    ENDO - ICU hypoglycemic\Hyperglycemia protocol -check FSBS per protocol   GI GI PROPHYLAXIS as indicated  NUTRITIONAL STATUS DIET-->TF's as tolerated Constipation protocol as indicated   ELECTROLYTES -follow labs as needed -replace as needed -pharmacy consultation and following      Best practice (right click and "Reselect all SmartList Selections" daily)  Diet:Tube Feed Pain/Anxiety/Delirium protocol (if indicated):Yes(RASS goal -2) VAP protocol (if indicated):Yes DVT prophylaxis:Subcutaneous Heparin GI prophylaxis:PPI Glucose control:SSIYes Central venous access:Yes, and it is still needed Arterial line:N/A Foley:Yes, and it is still needed Mobility:bed rest PT consulted:N/A Code Status:full code Disposition:ICU     Labs   CBC: Recent Labs  Lab 02/27/21 0353 03/02/21 1021 03/03/21 0322 03/04/21 0431 03/05/21 0415  WBC 10.7* 11.4* 9.6 8.6 8.8  NEUTROABS  --   --   --  6.9  --   HGB 13.7 12.4* 10.6* 10.7* 10.6*  HCT 42.6 39.3 34.3* 34.5* 34.4*  MCV 101.2* 103.4* 104.3* 105.2* 105.8*  PLT 145* 156 150 149* 202    Basic Metabolic Panel: Recent  Labs  Lab 02/28/21 0343 03/01/21 0423 03/03/21 0322 03/04/21 0431 03/05/21 0415  NA 137 139 138 141 144  K 4.6 4.7 4.8 4.1 3.9  CL 98 97* 98 101 104  CO2 32 33* 31 33* 32  GLUCOSE 149* 128* 193* 208* 106*  BUN 23 28* 43* 55* 60*  CREATININE 0.87 0.85 1.07 1.21 1.22  CALCIUM 8.7* 9.0 8.2* 8.4* 8.1*  MG 2.3 2.5* 2.3 2.4 2.6*  PHOS 2.8 3.4 4.4 4.2 3.9   GFR: Estimated Creatinine Clearance: 93.6 mL/min (by C-G formula based on SCr of 1.22 mg/dL). Recent Labs  Lab  02/27/21 0350 02/27/21 0353 03/02/21 1021 03/03/21 0322 03/04/21 0431 03/05/21 0415  PROCALCITON 0.13  --  <0.10  --   --   --   WBC  --    < > 11.4* 9.6 8.6 8.8   < > = values in this interval not displayed.    Liver Function Tests: No results for input(s): AST, ALT, ALKPHOS, BILITOT, PROT, ALBUMIN in the last 168 hours. No results for input(s): LIPASE, AMYLASE in the last 168 hours. No results for input(s): AMMONIA in the last 168 hours.  ABG    Component Value Date/Time   PHART 7.40 03/02/2021 2000   PCO2ART 69 (HH) 03/02/2021 2000   PO2ART 137 (H) 03/02/2021 2000   HCO3 42.7 (H) 03/02/2021 2000   O2SAT 99.1 03/02/2021 2000     Coagulation Profile: No results for input(s): INR, PROTIME in the last 168 hours.  Cardiac Enzymes: No results for input(s): CKTOTAL, CKMB, CKMBINDEX, TROPONINI in the last 168 hours.  HbA1C: Hgb A1c MFr Bld  Date/Time Value Ref Range Status  03/02/2021 04:52 AM 5.9 (H) 4.8 - 5.6 % Final    Comment:    (NOTE)         Prediabetes: 5.7 - 6.4         Diabetes: >6.4         Glycemic control for adults with diabetes: <7.0     CBG: Recent Labs  Lab 03/04/21 1128 03/04/21 1553 03/04/21 1917 03/04/21 2316 03/05/21 0315  GLUCAP 87 135* 110* 94 115*    Allergies Allergies  Allergen Reactions  . Divalproex Sodium Other (See Comments)    Elevated liver enzymes  . Valproic Acid     unknown       DVT/GI PRX  assessed I Assessed the need for Labs I Assessed the need for Foley I Assessed the need for Central Venous Line Family Discussion when available I Assessed the need for Mobilization I made an Assessment of medications to be adjusted accordingly Safety Risk assessment completed  CASE DISCUSSED IN MULTIDISCIPLINARY ROUNDS WITH ICU TEAM     Critical Care Time devoted to patient care services described in this note is 60  minutes.  Critical care was necessary to treat or prevent imminent or life-threatening  deterioration. Overall, patient is critically ill, prognosis is guarded.  Patient with Multiorgan failure and at high risk for cardiac arrest and death.   Patient is DNR, plan for [facility]   Maretta Bees [name], M.D.  [facility] Pulmonary & Critical Care Medicine  Medical Director [place] Director [facility] Cardio-Pulmonary Department

## 2021-03-06 ENCOUNTER — Encounter: Payer: Self-pay | Admitting: Pulmonary Disease

## 2021-03-06 LAB — BASIC METABOLIC PANEL
Anion gap: 4 — ABNORMAL LOW (ref 5–15)
BUN: 60 mg/dL — ABNORMAL HIGH (ref 8–23)
CO2: 34 mmol/L — ABNORMAL HIGH (ref 22–32)
Calcium: 8.3 mg/dL — ABNORMAL LOW (ref 8.9–10.3)
Chloride: 107 mmol/L (ref 98–111)
Creatinine, Ser: 1.24 mg/dL (ref 0.61–1.24)
GFR, Estimated: >60 mL/min (ref >60)
Glucose, Bld: 105 mg/dL — ABNORMAL HIGH (ref 70–99)
Potassium: 3.9 mmol/L (ref 3.5–5.1)
Sodium: 145 mmol/L (ref 135–145)

## 2021-03-06 LAB — GLUCOSE, CAPILLARY
Glucose-Capillary: 106 mg/dL — ABNORMAL HIGH (ref 70–99)
Glucose-Capillary: 107 mg/dL — ABNORMAL HIGH (ref 70–99)
Glucose-Capillary: 109 mg/dL — ABNORMAL HIGH (ref 70–99)
Glucose-Capillary: 110 mg/dL — ABNORMAL HIGH (ref 70–99)
Glucose-Capillary: 115 mg/dL — ABNORMAL HIGH (ref 70–99)
Glucose-Capillary: 116 mg/dL — ABNORMAL HIGH (ref 70–99)
Glucose-Capillary: 98 mg/dL (ref 70–99)

## 2021-03-06 LAB — CBC
HCT: 32.8 % — ABNORMAL LOW (ref 39.0–52.0)
Hemoglobin: 10 g/dL — ABNORMAL LOW (ref 13.0–17.0)
MCH: 32.7 pg (ref 26.0–34.0)
MCHC: 30.5 g/dL (ref 30.0–36.0)
MCV: 107.2 fL — ABNORMAL HIGH (ref 80.0–100.0)
Platelets: 140 10*3/uL — ABNORMAL LOW (ref 150–400)
RBC: 3.06 MIL/uL — ABNORMAL LOW (ref 4.22–5.81)
RDW: 14.1 % (ref 11.5–15.5)
WBC: 8 10*3/uL (ref 4.0–10.5)
nRBC: 0 % (ref 0.0–0.2)

## 2021-03-06 LAB — PHOSPHORUS: Phosphorus: 3.8 mg/dL (ref 2.5–4.6)

## 2021-03-06 LAB — MAGNESIUM: Magnesium: 2.5 mg/dL — ABNORMAL HIGH (ref 1.7–2.4)

## 2021-03-06 MED ORDER — SODIUM CHLORIDE 0.9 % IV SOLN
1.0000 mg/h | INTRAVENOUS | Status: DC
  Administered 2021-03-06 – 2021-03-07 (×2): 2 mg/h via INTRAVENOUS
  Administered 2021-03-09: 1 mg/h via INTRAVENOUS
  Administered 2021-03-09: 6 mg/h via INTRAVENOUS
  Administered 2021-03-09: 2 mg/h via INTRAVENOUS
  Administered 2021-03-10 (×2): 6 mg/h via INTRAVENOUS
  Administered 2021-03-11: 3 mg/h via INTRAVENOUS
  Administered 2021-03-11: 6 mg/h via INTRAVENOUS
  Filled 2021-03-06 (×10): qty 5

## 2021-03-06 NOTE — Progress Notes (Signed)
NAME:  Eddie Chapman, MRN:  [id number], DOB:  1961-06-13, LOS: 45 ADMISSION DATE:  02/23/2021 Patient has a complex comorbid history including systolic CHF chronically, pulmonary hypertension, obstructive sleep apnea, chronic hypoxemia with respiratory failure, obesity hypoventilation syndrome with thoracic restriction, hormonal dysfunction with hypotestosteronism, dyslipidemia, metabolic syndrome, previous subarachnoid hemorrhage, history of traumatic thoracic spinal injury, depression.  Patient was admitted in January to the intensive care unit with altered mental status acute hypoxemic and hypercapnic respiratory failure with metabolic encephalopathy and left lower lobe severe pneumonia.  Throughout hospitalization he was noted to have lethargy with hypercapnic encephalopathy and became unresponsive a few times even postextubation while on medical floor.  Patient has never smoked in the past.  On this admission he is poorly responsive with hypercapnic hypoxemic respiratory failure     Significant Hospital Events: Including procedures, antibiotic start and stop dates in addition to other pertinent events    02/24/21-Patient responded well to mechanical ventilation with improvement in hypercapnia on ABG this am. Seems that he had THC induced apnea/hypopnea based on laboratory testing. There is bibasilar atelectasis induced severe hypoxemia, recruitment Metaneb therapy has been ordered. Plan to repeat CXR this afternoon with SBT today. Reviewed plan with RN and RT today.    02/25/21-patient was unable to pass SBT today. He was able to come off propofol with RASS-0 and is weaned down on levophed.    02/26/21-patient continues to require levophed, he has been difficult to wean. I have asked ID for consultation due to concern for sepsis possibly due to CAP. Unable to perform SBT today. Family at bedside.   02/27/21-patient on PRVC with heavy secretions, difficulty weaning with  high narcotic tolerance. Patient had suicide attempt with requirement for psych.  6/1 failing to wean from vent, DNR status 6/2 failed to wean from vent, severe hypoxia 6/3 severe hypoxia, failure to wean from vent, needs [facility], ENT consulted 6/4 failure to wean from  6/6 failure to wean from vent, plan for [facility] 6/7 failure to wean from vent    MICRO DATA 5/31 SPUTUM CULTURE ACINETOBACTER CALCOACETICUS/BAUMANNII COMPLEX   Antibiotics Given (last 72 hours)    Date/Time Action Medication Dose Rate   03/03/21 1143 New Bag/Given   Ampicillin-Sulbactam (UNASYN) 3 g in sodium chloride 0.9 % 100 mL IVPB 3 g 200 mL/hr   03/03/21 1742 New Bag/Given   Ampicillin-Sulbactam (UNASYN) 3 g in sodium chloride 0.9 % 100 mL IVPB 3 g 200 mL/hr   03/03/21 2305 New Bag/Given   Ampicillin-Sulbactam (UNASYN) 3 g in sodium chloride 0.9 % 100 mL IVPB 3 g 200 mL/hr   03/04/21 0539 New Bag/Given   Ampicillin-Sulbactam (UNASYN) 3 g in sodium chloride 0.9 % 100 mL IVPB 3 g 200 mL/hr   03/04/21 1155 New Bag/Given   Ampicillin-Sulbactam (UNASYN) 3 g in sodium chloride 0.9 % 100 mL IVPB 3 g 200 mL/hr   03/04/21 1723 New Bag/Given   Ampicillin-Sulbactam (UNASYN) 3 g in sodium chloride 0.9 % 100 mL IVPB 3 g 200 mL/hr   03/04/21 2320 New Bag/Given   Ampicillin-Sulbactam (UNASYN) 3 g in sodium chloride 0.9 % 100 mL IVPB 3 g 200 mL/hr   03/05/21 0531 New Bag/Given   Ampicillin-Sulbactam (UNASYN) 3 g in sodium chloride 0.9 % 100 mL IVPB 3 g 200 mL/hr   03/05/21 1317 New Bag/Given   Ampicillin-Sulbactam (UNASYN) 3 g in sodium chloride 0.9 % 100 mL IVPB 3 g 200 mL/hr   03/05/21 1817 New Bag/Given   Ampicillin-Sulbactam (UNASYN)  3 g in sodium chloride 0.9 % 100 mL IVPB 3 g 200 mL/hr   03/06/21 0012 New Bag/Given   Ampicillin-Sulbactam (UNASYN) 3 g in sodium chloride 0.9 % 100 mL IVPB 3 g 200 mL/hr   03/06/21 0533 New Bag/Given   Ampicillin-Sulbactam (UNASYN) 3 g in sodium chloride 0.9 % 100 mL IVPB 3 g 200  mL/hr        Interim History / Subjective:  Remains critically ill Morbid obese Difficult airway and intubation Needs trach to survive     Objective   Blood pressure 92/60, pulse 87, temperature 97.88 F (36.6 C), resp. rate 15, height 5\' 10"  (1.778 m), weight (!) 156 kg, SpO2 93 %.    Vent Mode: PRVC FiO2 (%):  [45 %] 45 % Set Rate:  [15 bmp] 15 bmp Vt Set:  [550 mL] 550 mL PEEP:  [10 cmH20] 10 cmH20   Intake/Output Summary (Last 24 hours) at 03/06/2021 1040 Last data filed at 03/06/2021 0600 Gross per 24 hour  Intake 1755.32 ml  Output 1450 ml  Net 305.32 ml   Filed Weights   03/04/21 0500 03/05/21 0348 03/06/21 0500  Weight: (!) 155.7 kg (!) 150.8 kg (!) 156 kg     REVIEW OF SYSTEMS  PATIENT IS UNABLE TO PROVIDE COMPLETE REVIEW OF SYSTEMS DUE TO SEVERE CRITICAL ILLNESS AND TOXIC METABOLIC ENCEPHALOPATHY   PHYSICAL EXAMINATION:  GENERAL:critically ill appearing, +resp distress HEAD: Normocephalic, atraumatic.  EYES: Pupils equal, round, reactive to light.  No scleral icterus.  MOUTH: Moist mucosal membrane. NECK: Supple. No thyromegaly. No nodules. No JVD.  PULMONARY: +rhonchi, +wheezing CARDIOVASCULAR: S1 and S2. Regular rate and rhythm. No murmurs, rubs, or gallops.  GASTROINTESTINAL: Soft, nontender, -distended. Positive bowel sounds.  MUSCULOSKELETAL: No swelling, clubbing, or edema.  NEUROLOGIC: obtunded SKIN:intact,warm,dry     Labs/imaging that I havepersonally reviewed  (right click and "Reselect all SmartList Selections" daily)     ASSESSMENT AND PLAN SYNOPSIS     60 yo morbidly obese WM with acute and severe hypoxic and hypercapnic resp failure with CAP with severe toxic metabolic encephalopathy, failure to wean from vent+ACINOTOBACTER SPECIES PNEUMONIA  Severe ACUTE Hypoxic and Hypercapnic Respiratory Failure -continue Mechanical Ventilator support -continue Bronchodilator Therapy -Wean Fio2 and PEEP as tolerated -VAP/VENT  bundle implementation Failure to wean from vent ENT consulted for [facility]  SEVERE COPD EXACERBATION -continue IV steroids as prescribed -continue NEB THERAPY as prescribed   CARDIAC ICU monitoring  ACUTE KIDNEY INJURY/Renal Failure -continue Foley Catheter-assess need -Avoid nephrotoxic agents -Follow urine output, BMP -Ensure adequate renal perfusion, optimize oxygenation -Renal dose medications   NEUROLOGY ACUTE TOXIC METABOLIC ENCEPHALOPATHY -need for sedation -Goal RASS -2 to -3'   INFECTIOUS DISEASE ACINETOBACTER CALCOACETICUS/BAUMANNII COMPLEX PNEUMONIA -continue antibiotics as prescribed  ENDO - ICU hypoglycemic\Hyperglycemia protocol -check FSBS per protocol   GI GI PROPHYLAXIS as indicated  NUTRITIONAL STATUS DIET-->TF's as tolerated Constipation protocol as indicated  ELECTROLYTES -follow labs as needed -replace as needed -pharmacy consultation and following     Best practice (right click and "Reselect all SmartList Selections" daily)  Diet:Tube Feed Pain/Anxiety/Delirium protocol (if indicated):Yes(RASS goal -2) VAP protocol (if indicated):Yes DVT prophylaxis:Subcutaneous Heparin GI prophylaxis:PPI Glucose control:SSIYes Central venous access:Yes, and it is still needed Arterial line:N/A Foley:Yes, and it is still needed Mobility:bed rest PT consulted:N/A Code Status:full code Disposition:ICU     Labs   CBC: Recent Labs  Lab 03/02/21 1021 03/03/21 0322 03/04/21 0431 03/05/21 0415 03/06/21 0340  WBC 11.4* 9.6 8.6 8.8 8.0  NEUTROABS  --   --  6.9  --   --   HGB 12.4* 10.6* 10.7* 10.6* 10.0*  HCT 39.3 34.3* 34.5* 34.4* 32.8*  MCV 103.4* 104.3* 105.2* 105.8* 107.2*  PLT 156 150 149* 150 140*    Basic Metabolic Panel: Recent Labs  Lab 03/01/21 0423 03/03/21 0322 03/04/21 0431 03/05/21 0415 03/06/21 0340  NA 139 138 141 144 145  K 4.7 4.8 4.1 3.9 3.9  CL 97* 98 101 104 107  CO2 33* 31 33*  32 34*  GLUCOSE 128* 193* 208* 106* 105*  BUN 28* 43* 55* 60* 60*  CREATININE 0.85 1.07 1.21 1.22 1.24  CALCIUM 9.0 8.2* 8.4* 8.1* 8.3*  MG 2.5* 2.3 2.4 2.6* 2.5*  PHOS 3.4 4.4 4.2 3.9 3.8   GFR: Estimated Creatinine Clearance: 94 mL/min (by C-G formula based on SCr of 1.24 mg/dL). Recent Labs  Lab 03/02/21 1021 03/03/21 0322 03/04/21 0431 03/05/21 0415 03/06/21 0340  PROCALCITON <0.10  --   --   --   --   WBC 11.4* 9.6 8.6 8.8 8.0    Liver Function Tests: No results for input(s): AST, ALT, ALKPHOS, BILITOT, PROT, ALBUMIN in the last 168 hours. No results for input(s): LIPASE, AMYLASE in the last 168 hours. No results for input(s): AMMONIA in the last 168 hours.  ABG    Component Value Date/Time   PHART 7.40 03/02/2021 2000   PCO2ART 69 (HH) 03/02/2021 2000   PO2ART 137 (H) 03/02/2021 2000   HCO3 42.7 (H) 03/02/2021 2000   O2SAT 99.1 03/02/2021 2000     Coagulation Profile: No results for input(s): INR, PROTIME in the last 168 hours.  Cardiac Enzymes: No results for input(s): CKTOTAL, CKMB, CKMBINDEX, TROPONINI in the last 168 hours.  HbA1C: Hgb A1c MFr Bld  Date/Time Value Ref Range Status  03/02/2021 04:52 AM 5.9 (H) 4.8 - 5.6 % Final    Comment:    (NOTE)         Prediabetes: 5.7 - 6.4         Diabetes: >6.4         Glycemic control for adults with diabetes: <7.0     CBG: Recent Labs  Lab 03/05/21 1506 03/05/21 1954 03/06/21 0003 03/06/21 0344 03/06/21 0716  GLUCAP 126* 130* 110* 109* 98    Allergies Allergies  Allergen Reactions  . Divalproex Sodium Other (See Comments)    Elevated liver enzymes  . Valproic Acid     unknown        DVT/GI PRX  assessed I Assessed the need for Labs I Assessed the need for Foley I Assessed the need for Central Venous Line Family Discussion when available I Assessed the need for Mobilization I made an Assessment of medications to be adjusted accordingly Safety Risk assessment completed  CASE  DISCUSSED IN MULTIDISCIPLINARY ROUNDS WITH ICU TEAM     Critical Care Time devoted to patient care services described in this note is 50  minutes.  Critical care was necessary to treat /prevent imminent and life-threatening deterioration. Overall, patient is critically ill, prognosis is guarded.  Patient with Multiorgan failure and at high risk for cardiac arrest and death.   Patient is DNR Plan for [facility] tomorrow  Lilac Hoff [name], M.D.  [facility] Pulmonary & Critical Care Medicine  Medical Director [place] Director [facility] Cardio-Pulmonary Department

## 2021-03-06 NOTE — Anesthesia Preprocedure Evaluation (Signed)
Anesthesia Evaluation  Patient identified by MRN, date of birth, ID band Patient awake    Reviewed: Allergy & Precautions, NPO status , Patient's Chart, lab work & pertinent test results  History of Anesthesia Complications Negative for: history of anesthetic complications  Airway Mallampati: Intubated  TM Distance: >3 FB Neck ROM: limited    Dental  (+) Chipped   Pulmonary sleep apnea ,    Pulmonary exam normal        Cardiovascular Exercise Tolerance: Poor hypertension, +CHF  Normal cardiovascular exam     Neuro/Psych PSYCHIATRIC DISORDERS negative neurological ROS  negative psych ROS   GI/Hepatic Neg liver ROS, GERD  ,  Endo/Other  negative endocrine ROS  Renal/GU      Musculoskeletal   Abdominal   Peds  Hematology negative hematology ROS (+)   Anesthesia Other Findings Past Medical History: No date: Adenomatous colon polyp No date: Depression No date: Ear drum perforation     Comment:  left x2  No date: GERD (gastroesophageal reflux disease) No date: Hyperlipidemia No date: Hypertension No date: T8 vertebral fracture ([place])  Past Surgical History: No date: CARPAL TUNNEL RELEASE     Comment:  left hand 09/18/2009, 09/15/2014: COLONOSCOPY 12/29/2017: COLONOSCOPY WITH PROPOFOL; N/A     Comment:  Procedure: COLONOSCOPY WITH PROPOFOL;  Surgeon: Manya Silvas, MD;  Location: [facility] ENDOSCOPY;  Service:               Endoscopy;  Laterality: N/A; No date: EYE SURGERY No date: FRACTURE SURGERY No date: HERNIA REPAIR No date: LIPOMA EXCISION No date: SPINE SURGERY  BMI    Body Mass Index: 49.35 kg/m      Reproductive/Obstetrics negative OB ROS                             Anesthesia Physical Anesthesia Plan  ASA: IV  Anesthesia Plan: General ETT   Post-op Pain Management:    Induction: Intravenous  PONV Risk Score and Plan: Ondansetron, Dexamethasone,  Midazolam and Treatment may vary due to age or medical condition  Airway Management Planned: Oral ETT  Additional Equipment:   Intra-op Plan:   Post-operative Plan: Post-operative intubation/ventilation  Informed Consent: I have reviewed the patients History and Physical, chart, labs and discussed the procedure including the risks, benefits and alternatives for the proposed anesthesia with the patient or authorized representative who has indicated his/her understanding and acceptance.   Patient has DNR.  Discussed DNR with power of attorney and Suspend DNR.   Dental Advisory Given  Plan Discussed with: Anesthesiologist, CRNA and Surgeon  Anesthesia Plan Comments: (History and phone consent from the patients POA Bubba Hales at [phone]   She was consented for risks of anesthesia including but not limited to:  - adverse reactions to medications - damage to eyes, teeth, lips or other oral mucosa - nerve damage due to positioning  - sore throat or hoarseness - Damage to heart, brain, nerves, lungs, other parts of body or loss of life  She voiced understanding.)        Anesthesia Quick Evaluation

## 2021-03-07 ENCOUNTER — Inpatient Hospital Stay: Payer: PPO

## 2021-03-07 ENCOUNTER — Encounter
Admission: EM | Disposition: A | Discharge status: Other Acute Inpatient Hospital | Payer: Self-pay | Source: Home / Self Care | Attending: Internal Medicine

## 2021-03-07 ENCOUNTER — Inpatient Hospital Stay: Payer: PPO | Admitting: Anesthesiology

## 2021-03-07 ENCOUNTER — Encounter: Payer: Self-pay | Admitting: Pulmonary Disease

## 2021-03-07 HISTORY — PX: TRACHEOSTOMY TUBE PLACEMENT: SHX814

## 2021-03-07 LAB — MAGNESIUM: Magnesium: 2.6 mg/dL — ABNORMAL HIGH (ref 1.7–2.4)

## 2021-03-07 LAB — GLUCOSE, CAPILLARY
Glucose-Capillary: 97 mg/dL (ref 70–99)
Glucose-Capillary: 86 mg/dL (ref 70–99)
Glucose-Capillary: 91 mg/dL (ref 70–99)
Glucose-Capillary: 111 mg/dL — ABNORMAL HIGH (ref 70–99)
Glucose-Capillary: 89 mg/dL (ref 70–99)
Glucose-Capillary: 95 mg/dL (ref 70–99)

## 2021-03-07 LAB — BASIC METABOLIC PANEL
Anion gap: 9 (ref 5–15)
BUN: 59 mg/dL — ABNORMAL HIGH (ref 8–23)
CO2: 36 mmol/L — ABNORMAL HIGH (ref 22–32)
Calcium: 9.4 mg/dL (ref 8.9–10.3)
Chloride: 106 mmol/L (ref 98–111)
Creatinine, Ser: 1.22 mg/dL (ref 0.61–1.24)
GFR, Estimated: >60 mL/min (ref >60)
Glucose, Bld: 100 mg/dL — ABNORMAL HIGH (ref 70–99)
Potassium: 4.4 mmol/L (ref 3.5–5.1)
Sodium: 151 mmol/L — ABNORMAL HIGH (ref 135–145)

## 2021-03-07 LAB — PHOSPHORUS: Phosphorus: 3.9 mg/dL (ref 2.5–4.6)

## 2021-03-07 SURGERY — CREATION, TRACHEOSTOMY
Anesthesia: General

## 2021-03-07 MED ORDER — ROCURONIUM BROMIDE 10 MG/ML (PF) SYRINGE
PREFILLED_SYRINGE | INTRAVENOUS | Status: AC
  Filled 2021-03-07: qty 10

## 2021-03-07 MED ORDER — FENTANYL CITRATE (PF) 100 MCG/2ML IJ SOLN
INTRAMUSCULAR | Status: AC
  Filled 2021-03-07: qty 2

## 2021-03-07 MED ORDER — VITAL HIGH PROTEIN PO LIQD: 1000.0000 mL | ORAL | Status: DC

## 2021-03-07 MED ORDER — MIDAZOLAM HCL 2 MG/2ML IJ SOLN
INTRAMUSCULAR | Status: DC | PRN
  Administered 2021-03-07: 2 mg via INTRAVENOUS

## 2021-03-07 MED ORDER — PROSOURCE TF PO LIQD
90.0000 mL | Freq: Four times a day (QID) | ORAL | Status: DC
  Administered 2021-03-07 – 2021-03-14 (×29): 90 mL
  Filled 2021-03-07: qty 90

## 2021-03-07 MED ORDER — LIDOCAINE-EPINEPHRINE (PF) 1 %-1:200000 IJ SOLN
INTRAMUSCULAR | Status: DC | PRN
  Administered 2021-03-07: 4 mL

## 2021-03-07 MED ORDER — MIDAZOLAM HCL 2 MG/2ML IJ SOLN
INTRAMUSCULAR | Status: AC
  Filled 2021-03-07: qty 2

## 2021-03-07 MED ORDER — PROPOFOL 10 MG/ML IV BOLUS
INTRAVENOUS | Status: AC
  Filled 2021-03-07: qty 20

## 2021-03-07 MED ORDER — HEPARIN SODIUM (PORCINE) 5000 UNIT/ML IJ SOLN
5000.0000 [IU] | Freq: Three times a day (TID) | INTRAMUSCULAR | Status: DC
  Administered 2021-03-07 – 2021-03-10 (×9): 5000 [IU] via SUBCUTANEOUS
  Filled 2021-03-07 (×9): qty 1

## 2021-03-07 MED ORDER — VITAL 1.5 CAL PO LIQD
1000.0000 mL | ORAL | Status: DC
  Administered 2021-03-07 – 2021-03-13 (×7): 1000 mL

## 2021-03-07 MED ORDER — FENTANYL CITRATE (PF) 100 MCG/2ML IJ SOLN
INTRAMUSCULAR | Status: DC | PRN
  Administered 2021-03-07: 100 ug via INTRAVENOUS

## 2021-03-07 MED ORDER — ROCURONIUM BROMIDE 100 MG/10ML IV SOLN
INTRAVENOUS | Status: DC | PRN
  Administered 2021-03-07: 50 mg via INTRAVENOUS

## 2021-03-07 MED ORDER — FREE WATER
150.0000 mL | Status: DC
  Administered 2021-03-07 – 2021-03-08 (×5): 150 mL

## 2021-03-07 SURGICAL SUPPLY — 29 items
BLADE SURG 15 STRL LF DISP TIS (BLADE) ×1 IMPLANT
BLADE SURG 15 STRL SS (BLADE) ×2
BLADE SURG SZ11 CARB STEEL (BLADE) ×2 IMPLANT
COVER WAND RF STERILE (DRAPES) ×2 IMPLANT
ELECT REM PT RETURN 9FT ADLT (ELECTROSURGICAL) ×2
ELECTRODE REM PT RTRN 9FT ADLT (ELECTROSURGICAL) ×1 IMPLANT
GLOVE PROTEXIS LATEX SZ 7.5 (GLOVE) ×2 IMPLANT
GOWN STRL REUS W/ TWL LRG LVL3 (GOWN DISPOSABLE) ×2 IMPLANT
GOWN STRL REUS W/TWL LRG LVL3 (GOWN DISPOSABLE) ×4
HLDR TRACH TUBE NECKBAND 18 (MISCELLANEOUS) ×1 IMPLANT
HOLDER TRACH TUBE NECKBAND 18 (MISCELLANEOUS) ×1
MANIFOLD NEPTUNE II (INSTRUMENTS) ×2 IMPLANT
NS IRRIG 500ML POUR BTL (IV SOLUTION) ×2 IMPLANT
PACK HEAD/NECK (MISCELLANEOUS) ×2 IMPLANT
SHEARS HARMONIC 9CM CVD (BLADE) ×2 IMPLANT
SPONGE DRAIN TRACH 4X4 STRL 2S (GAUZE/BANDAGES/DRESSINGS) ×2 IMPLANT
SUCTION FRAZIER HANDLE 10FR (MISCELLANEOUS) ×1
SUCTION TUBE FRAZIER 10FR DISP (MISCELLANEOUS) ×1 IMPLANT
SUT ETHILON 2 0 FS 18 (SUTURE) ×2 IMPLANT
SUT SILK 2 0 (SUTURE)
SUT SILK 2-0 18XBRD TIE 12 (SUTURE) IMPLANT
SUT VIC AB 4-0 RB1 27 (SUTURE) ×2
SUT VIC AB 4-0 RB1 27X BRD (SUTURE) ×1 IMPLANT
TUBE TRACH  6.0 CUFF FLEX (MISCELLANEOUS)
TUBE TRACH 6.0 CUFF FLEX (MISCELLANEOUS) IMPLANT
TUBE TRACH 7.0 EXL PROX CUF (TUBING) ×2 IMPLANT
TUBE TRACH 8.0 EXL PROX  CUF (TUBING) ×1
TUBE TRACH FLEX 8.0 CUFF (MISCELLANEOUS)
TUBE TRACH FLEX 8.5 CUFF (MISCELLANEOUS) IMPLANT

## 2021-03-07 NOTE — Op Note (Signed)
03/07/2021 8:35 AM  Paige,  Jenny Reichmann [id number]   Pre-Op Dx: Vent dependent with failure to wean  Post-Op Dx: Vent dependent with failure to wean  Proc:  Tracheostomy  Surg:  Elon Alas Onesimo Lingard  Anes:  GOT  EBL: Minimal  Comp: None  Findings: Patient had very little neck.  His cricoid ring was sitting at the level of the sternal notch.  It is very fat with a thick wide neck that is extremely short.  The oral endotracheal tube was noted to have the cuff sitting just above the cords and just the end of the trach tube was through the larynx.  A 7 Shiley XLT proximal length tube was placed.  Procedure:  The patient was brought from the intensive care unit to the operating room and transferred to an operating table.  Anesthesia was administered per indwelling orotracheal tube.   Neck extension was achieved as possible anda shoulder rolke was placed.  The lower neck was palpated with the findings as described above.  1% Xylocaine with 1:100,000 epinephrine, 4 cc's, was infiltrated into the surgical field for intraoperative hemostasis.  Several minutes were allowed for this to take effect. The patient was prepped in a sterile fashion with a surgical prep from the chin down to the upper chest.  Sterile draping was accomplished in the standard fashion.  A 3 cm horizontal incision was made sharply a finger's breadth above the sternal notch, and extended through skin and subcutaneous fat.  Using cautery, the subcu fat was trimmed at the incision line down to the level of the strap muscles.  Superficial layer of the deep cervical fascia was lysed.  Additional dissection revealed the strap muscles.  The midline raphe was divided in two layers and the muscles retracted laterally.  The pretracheal plane was visualized.  This was entered bluntly.  The thyroid isthmus was isolated and divided with the Harmonic scalpel.  The thyroid gland was retracted to either side.  The anterior face of the trachea was cleared.  In  the interspace between the second and third tracheal ring, a transverse incision was made between cartilage rings into the tracheal lumen.  A 6 mm wide inferiorly based flap was generated and secured to the lower wound with a 4-0 chromic suture.   A previously tested  #7 Shiley XLT proximal length cuffed tracheostomy tube was brought into the field.  With the endotracheal tube under direct visualization through the tracheostomy, a trach tube was placed.  The previous oral endotracheal tube was lying above the incision line and the cuff was above the cords.  The tracheostomy tube was inserted into the tracheal lumen.  Hemostasis was observed. The cuff was inflated and observed to be intact and containing pressure. The inner cannula was placed and ventilation assumed per tracheostomy tube.  CO2 was documented per anesthesia.  The trach tube was secured in the standard fashion with trach ties. A 2-0 Nylon suture was used to secure the trach tube to the skin on both sides.  Hemostasis was observed again.  When satisfactory ventilation was assured, the orotracheal tube was removed.  At this point the procedure was completed.  The patient was returned to anesthesia, awakened as possible, and transferred back to the intensive care unit in stable condition.  Comment: 60 y.o. male with prolonged ventilation was the indication for today's procedure.  Anticipate a routine postoperative recovery including standard tracheal hygiene.  The sutures should be removed in 5 days.  When the patient no  longer requires ventilator or pressure support, the cuff should be deflated.  Changing to an uncuffed tube and downsizing will be according to the clinical condition of the patient.   Elon Alas Kagen Kunath  8:35 AM 03/07/2021

## 2021-03-07 NOTE — Progress Notes (Signed)
NAME:  Eddie Chapman, MRN:  [id number], DOB:  [birth year], LOS: 17 ADMISSION DATE:  02/23/2021 Patient has a complex comorbid history including systolic CHF chronically, pulmonary hypertension, obstructive sleep apnea, chronic hypoxemia with respiratory failure, obesity hypoventilation syndrome with thoracic restriction, hormonal dysfunction with hypotestosteronism, dyslipidemia, metabolic syndrome, previous subarachnoid hemorrhage, history of traumatic thoracic spinal injury, depression.  Patient was admitted in January to the intensive care unit with altered mental status acute hypoxemic and hypercapnic respiratory failure with metabolic encephalopathy and left lower lobe severe pneumonia.  Throughout hospitalization he was noted to have lethargy with hypercapnic encephalopathy and became unresponsive a few times even postextubation while on medical floor.  Patient has never smoked in the past.  On this admission he is poorly responsive with hypercapnic hypoxemic respiratory failure     Significant Hospital Events: Including procedures, antibiotic start and stop dates in addition to other pertinent events    02/24/21-Patient responded well to mechanical ventilation with improvement in hypercapnia on ABG this am. Seems that he had THC induced apnea/hypopnea based on laboratory testing. There is bibasilar atelectasis induced severe hypoxemia, recruitment Metaneb therapy has been ordered. Plan to repeat CXR this afternoon with SBT today. Reviewed plan with RN and RT today.    02/25/21-patient was unable to pass SBT today. He was able to come off propofol with RASS-0 and is weaned down on levophed.    02/26/21-patient continues to require levophed, he has been difficult to wean. I have asked ID for consultation due to concern for sepsis possibly due to CAP. Unable to perform SBT today. Family at bedside.   02/27/21-patient on PRVC with heavy secretions, difficulty weaning with  high narcotic tolerance. Patient had suicide attempt with requirement for psych.  6/1 failing to wean from vent, DNR status 6/2 failed to wean from vent, severe hypoxia 6/3 severe hypoxia, failure to wean from vent, needs [facility], ENT consulted 6/4 failure to wean from  6/6 failure to wean from vent, plan for [facility] 6/7 failure to wean from vent 6/8 plan for [facility] TODAY    MICRO DATA 5/31 SPUTUM CULTURE ACINETOBACTER CALCOACETICUS/BAUMANNII COMPLEX   Antibiotics Given (last 72 hours)    Date/Time Action Medication Dose Rate   03/04/21 1155 New Bag/Given   [MAR Hold] Ampicillin-Sulbactam (UNASYN) 3 g in sodium chloride 0.9 % 100 mL IVPB (MAR Hold since Wed 03/07/2021 at 0735.Hold Reason: Transfer to a Procedural area.) 3 g 200 mL/hr   03/04/21 1723 New Bag/Given   [MAR Hold] Ampicillin-Sulbactam (UNASYN) 3 g in sodium chloride 0.9 % 100 mL IVPB (MAR Hold since Wed 03/07/2021 at 0735.Hold Reason: Transfer to a Procedural area.) 3 g 200 mL/hr   03/04/21 2320 New Bag/Given   [MAR Hold] Ampicillin-Sulbactam (UNASYN) 3 g in sodium chloride 0.9 % 100 mL IVPB (MAR Hold since Wed 03/07/2021 at 0735.Hold Reason: Transfer to a Procedural area.) 3 g 200 mL/hr   03/05/21 0531 New Bag/Given   [MAR Hold] Ampicillin-Sulbactam (UNASYN) 3 g in sodium chloride 0.9 % 100 mL IVPB (MAR Hold since Wed 03/07/2021 at 0735.Hold Reason: Transfer to a Procedural area.) 3 g 200 mL/hr   03/05/21 1317 New Bag/Given   [MAR Hold] Ampicillin-Sulbactam (UNASYN) 3 g in sodium chloride 0.9 % 100 mL IVPB (MAR Hold since Wed 03/07/2021 at 0735.Hold Reason: Transfer to a Procedural area.) 3 g 200 mL/hr   03/05/21 1817 New Bag/Given   [MAR Hold] Ampicillin-Sulbactam (UNASYN) 3 g in sodium chloride 0.9 % 100 mL IVPB (MAR Hold since Wed  03/07/2021 at 0735.Hold Reason: Transfer to a Procedural area.) 3 g 200 mL/hr   03/06/21 0012 New Bag/Given   [MAR Hold] Ampicillin-Sulbactam (UNASYN) 3 g in sodium chloride 0.9 % 100 mL IVPB (MAR Hold  since Wed 03/07/2021 at 0735.Hold Reason: Transfer to a Procedural area.) 3 g 200 mL/hr   03/06/21 0533 New Bag/Given   [MAR Hold] Ampicillin-Sulbactam (UNASYN) 3 g in sodium chloride 0.9 % 100 mL IVPB (MAR Hold since Wed 03/07/2021 at 0735.Hold Reason: Transfer to a Procedural area.) 3 g 200 mL/hr   03/06/21 1140 New Bag/Given   [MAR Hold] Ampicillin-Sulbactam (UNASYN) 3 g in sodium chloride 0.9 % 100 mL IVPB (MAR Hold since Wed 03/07/2021 at 0735.Hold Reason: Transfer to a Procedural area.) 3 g 200 mL/hr   03/06/21 1716 New Bag/Given   [MAR Hold] Ampicillin-Sulbactam (UNASYN) 3 g in sodium chloride 0.9 % 100 mL IVPB (MAR Hold since Wed 03/07/2021 at 0735.Hold Reason: Transfer to a Procedural area.) 3 g 200 mL/hr   03/06/21 2350 New Bag/Given   [MAR Hold] Ampicillin-Sulbactam (UNASYN) 3 g in sodium chloride 0.9 % 100 mL IVPB (MAR Hold since Wed 03/07/2021 at 0735.Hold Reason: Transfer to a Procedural area.) 3 g 200 mL/hr   03/07/21 0519 New Bag/Given   [MAR Hold] Ampicillin-Sulbactam (UNASYN) 3 g in sodium chloride 0.9 % 100 mL IVPB (MAR Hold since Wed 03/07/2021 at 0735.Hold Reason: Transfer to a Procedural area.) 3 g 200 mL/hr        Interim History / Subjective:  morbidly obese Severe resp failure Needs TRACH to survive +pneumonia     Objective   Blood pressure 106/70, pulse 68, temperature 97.88 F (36.6 C), temperature source Esophageal, resp. rate 15, height 5\' 10"  (1.778 m), weight (!) 156.8 kg, SpO2 95 %.    Vent Mode: PRVC FiO2 (%):  [45 %] 45 % Set Rate:  [15 bmp] 15 bmp Vt Set:  [550 mL] 550 mL PEEP:  [10 cmH20] 10 cmH20   Intake/Output Summary (Last 24 hours) at 03/07/2021 0752 Last data filed at 03/07/2021 0720 Gross per 24 hour  Intake 1590.87 ml  Output 2850 ml  Net -1259.13 ml   Filed Weights   03/05/21 0348 03/06/21 0500 03/07/21 0500  Weight: (!) 150.8 kg (!) 156 kg (!) 156.8 kg    REVIEW OF SYSTEMS  PATIENT IS UNABLE TO PROVIDE COMPLETE REVIEW OF SYSTEMS DUE TO  SEVERE CRITICAL ILLNESS AND TOXIC METABOLIC ENCEPHALOPATHY   PHYSICAL EXAMINATION:  GENERAL:critically ill appearing, +resp distress NECK: Supple. No thyromegaly. No nodules. No JVD.  PULMONARY: +rhonchi, +wheezing CARDIOVASCULAR: S1 and S2. Regular rate and rhythm. No murmurs, rubs, or gallops.  GASTROINTESTINAL: Soft, nontender, -distended. Positive bowel sounds.  MUSCULOSKELETAL: No swelling, clubbing, or edema.  NEUROLOGIC: obtunded SKIN:intact,warm,dry       Labs/imaging that I havepersonally reviewed  (right click and "Reselect all SmartList Selections" daily)     ASSESSMENT AND PLAN SYNOPSIS   60 yo morbidly obese WM with acute and severe hypoxic and hypercapnic resp failure with CAP with severe toxic metabolic encephalopathy, failure to wean from vent+ACINOTOBACTER SPECIES PNEUMONIA   Severe ACUTE Hypoxic and Hypercapnic Respiratory Failure -continue Mechanical Ventilator support -continue Bronchodilator Therapy -Wean Fio2 and PEEP as tolerated -VAP/VENT bundle implementation Failure to wean from vent ENT consulted for [facility]  SEVERE COPD EXACERBATION -continue IV steroids as prescribed -continue NEB THERAPY as prescribed  CARDIAC ICU monitoring  ACUTE KIDNEY INJURY/Renal Failure -continue Foley Catheter-assess need -Avoid nephrotoxic agents -Follow urine output, BMP -Ensure  adequate renal perfusion, optimize oxygenation -Renal dose medications   NEUROLOGY ACUTE TOXIC METABOLIC ENCEPHALOPATHY -need for sedation -Goal RASS -2 to -3  INFECTIOUS DISEASE -continue antibiotics as prescribed -follow up cultures ACINETOBACTER CALCOACETICUS/BAUMANNII COMPLEX PNEUMONIA -continue antibiotics as prescribed  ENDO - ICU hypoglycemic\Hyperglycemia protocol -check FSBS per protocol    GI GI PROPHYLAXIS as indicated  NUTRITIONAL STATUS DIET-->TF's as tolerated Constipation protocol as indicated   ELECTROLYTES -follow labs as needed -replace  as needed -pharmacy consultation and following     Best practice (right click and "Reselect all SmartList Selections" daily)  Diet:Tube Feed Pain/Anxiety/Delirium protocol (if indicated):Yes(RASS goal -2) VAP protocol (if indicated):Yes DVT prophylaxis:Subcutaneous Heparin GI prophylaxis:PPI Glucose control:SSIYes Central venous access:Yes, and it is still needed Arterial line:N/A Foley:Yes, and it is still needed Mobility:bed rest PT consulted:N/A Code Status:full code Disposition:ICU     Labs   CBC: Recent Labs  Lab 03/02/21 1021 03/03/21 0322 03/04/21 0431 03/05/21 0415 03/06/21 0340  WBC 11.4* 9.6 8.6 8.8 8.0  NEUTROABS  --   --  6.9  --   --   HGB 12.4* 10.6* 10.7* 10.6* 10.0*  HCT 39.3 34.3* 34.5* 34.4* 32.8*  MCV 103.4* 104.3* 105.2* 105.8* 107.2*  PLT 156 150 149* 150 140*    Basic Metabolic Panel: Recent Labs  Lab 03/03/21 0322 03/04/21 0431 03/05/21 0415 03/06/21 0340 03/07/21 0516  NA 138 141 144 145 151*  K 4.8 4.1 3.9 3.9 4.4  CL 98 101 104 107 106  CO2 31 33* 32 34* 36*  GLUCOSE 193* 208* 106* 105* 100*  BUN 43* 55* 60* 60* 59*  CREATININE 1.07 1.21 1.22 1.24 1.22  CALCIUM 8.2* 8.4* 8.1* 8.3* 9.4  MG 2.3 2.4 2.6* 2.5* 2.6*  PHOS 4.4 4.2 3.9 3.8 3.9   GFR: Estimated Creatinine Clearance: 95.8 mL/min (by C-G formula based on SCr of 1.22 mg/dL). Recent Labs  Lab 03/02/21 1021 03/03/21 0322 03/04/21 0431 03/05/21 0415 03/06/21 0340  PROCALCITON <0.10  --   --   --   --   WBC 11.4* 9.6 8.6 8.8 8.0    ABG    Component Value Date/Time   PHART 7.40 03/02/2021 2000   PCO2ART 69 (HH) 03/02/2021 2000   PO2ART 137 (H) 03/02/2021 2000   HCO3 42.7 (H) 03/02/2021 2000   O2SAT 99.1 03/02/2021 2000    HbA1C: Hgb A1c MFr Bld  Date/Time Value Ref Range Status  03/02/2021 04:52 AM 5.9 (H) 4.8 - 5.6 % Final    Comment:    (NOTE)         Prediabetes: 5.7 - 6.4         Diabetes: >6.4         Glycemic control for  adults with diabetes: <7.0     CBG: Recent Labs  Lab 03/06/21 1456 03/06/21 1920 03/06/21 2347 03/07/21 0332 03/07/21 0723  GLUCAP 115* 116* 107* 97 86    Allergies Allergies  Allergen Reactions  . Divalproex Sodium Other (See Comments)    Elevated liver enzymes  . Valproic Acid     unknown       DVT/GI PRX  assessed I Assessed the need for Labs I Assessed the need for Foley I Assessed the need for Central Venous Line Family Discussion when available I Assessed the need for Mobilization I made an Assessment of medications to be adjusted accordingly Safety Risk assessment completed  CASE DISCUSSED IN MULTIDISCIPLINARY ROUNDS WITH ICU TEAM     Critical Care Time devoted to patient  care services described in this note is 45  minutes.  Critical care was necessary to treat /prevent imminent and life-threatening deterioration. Overall, patient is critically ill, prognosis is guarded.     Corrin Parker, M.D.  [facility]  Medical Director [place] Director [facility] Cardio-Pulmonary Department

## 2021-03-07 NOTE — Progress Notes (Signed)
Patient vitals stable throughout the shift. Patient noted agitated, PRN Versed administered as ordered. Fentanyl discontinued & Dilaudid started d/t lack of effectiveness. Adequate output noted. Patient scheduled to go to OR this morning for a tracheostomy. Consent signed & in chart.

## 2021-03-07 NOTE — Progress Notes (Signed)
OR team at bedside to transport patient for planned tracheostomy procedure. Patient VSS at time of transport. BG prior to transport 86. Patient on continuous dilaudid and versed for sedation, disconnected from bedside ventilator and BMV used for transport.

## 2021-03-07 NOTE — Anesthesia Procedure Notes (Addendum)
Procedure Name: Intubation Date/Time: 03/07/2021 7:30 AM Performed by: Allean Found, CRNA Pre-anesthesia Checklist: Patient identified and Emergency Drugs available Patient Re-evaluated:Patient Re-evaluated prior to induction Oxygen Delivery Method: Circle system utilized Preoxygenation: Pre-oxygenation with 100% oxygen Induction Type: IV induction Ventilation: Mask ventilation without difficulty Grade View: Grade I Tube type: Oral Tube size: 7.0 mm Number of attempts: 1 Airway Equipment and Method: Stylet Placement Confirmation: ETT inserted through vocal cords under direct vision,  positive ETCO2 and breath sounds checked- equal and bilateral Secured at: 21 cm Tube secured with: Tape Dental Injury: Teeth and Oropharynx as per pre-operative assessment

## 2021-03-07 NOTE — Progress Notes (Signed)
Nutrition Follow-up  DOCUMENTATION CODES:   Morbid obesity  INTERVENTION:   Change to Vital 1.5'@55ml' /hr + ProSource TF 18m QID  Free water flushes 1551mq4 hours   Regimen provides 2300kcal/day, 177g/day protein and 190876may free water   NUTRITION DIAGNOSIS:   Increased nutrient needs related to acute illness as evidenced by estimated needs.  GOAL:   Provide needs based on ASPEN/SCCM guidelines -met with tube feeds   MONITOR:   TF tolerance,I & O's,Vent status,Labs,Weight trends  ASSESSMENT:   [age]o male with a h/o HTN, HLD, OSA, CHF, MDD, GERD, cerebral aneurysm and pulmonary HTN who is admitted with CAP, COPD exacerbation and encephalopathy.   Pt s/p tracheostomy today. Pt remains ventilated and sedated. Pt tolerating tube feeds at goal rate; will increase estimated needs as pt is now s/p trach. NGT placed by RN. Plan is to resume tube feeds today. Pt with hypernatremia today; will increase free water. Pt +20L on his I & O's. Pt up ~16lbs since admit and is up ~27lbs from his UBW. [facility]    Medications reviewed and include: insulin, protonix, unasyn, hydromorphone  Labs reviewed: Na 151(H), K 4.4 wnl, BUN 59(H), P 3.9 wnl, Mg 2.6(H) Hgb 10.0(L), Hct 32.8(L)  Patient is currently intubated on ventilator support MV: 7.7 L/min Temp (24hrs), Avg:98.1 F (36.7 C), Min:96.98 F (36.1 C), Max:98.96 F (37.2 C)  Propofol: none   MAP- >31m52m UOP- 2650ml4met Order:   Diet Order            Diet NPO time specified  Diet effective now                EDUCATION NEEDS:   No education needs have been identified at this time  Skin:  Skin Assessment: Reviewed RN Assessment  Last BM:  6/7- type 7  Height:   Ht Readings from Last 1 Encounters:  03/01/21 '5\' 10"'  (1.778 m)    Weight:   Wt Readings from Last 1 Encounters:  03/07/21 (!) 156.8 kg    Ideal Body Weight:  75.5 kg  BMI:  Body mass index is 49.6 kg/m.  Estimated  Nutritional Needs:   Kcal:  2000-2300kcal/day  Protein:  151-189 g/d (2-2.5g/kg IBW)  Fluid:  2.3-2.6L/day  CaseyKoleen DistanceRD, LDN Please refer to [facility]RD and/or RD on-call/weekend/after hours pager

## 2021-03-07 NOTE — Transfer of Care (Signed)
Immediate Anesthesia Transfer of Care Note  Patient: Eddie Chapman  Procedure(s) Performed: TRACHEOSTOMY (N/A )  Patient Location: PACU  Anesthesia Type:General  Level of Consciousness: sedated and Patient remains intubated per anesthesia plan  Airway & Oxygen Therapy: Patient placed on Ventilator (see vital sign flow sheet for setting)  Post-op Assessment: Report given to RN and Post -op Vital signs reviewed and stable  Post vital signs: Reviewed and stable  Last Vitals:  Vitals Value Taken Time  BP    Temp    Pulse    Resp    SpO2      Last Pain:  Vitals:   03/07/21 0600  TempSrc: Esophageal  PainSc:          Complications: No complications documented.

## 2021-03-08 ENCOUNTER — Inpatient Hospital Stay: Payer: PPO

## 2021-03-08 ENCOUNTER — Encounter: Payer: Self-pay | Admitting: Otolaryngology

## 2021-03-08 LAB — BASIC METABOLIC PANEL
Anion gap: 9 (ref 5–15)
BUN: 53 mg/dL — ABNORMAL HIGH (ref 8–23)
CO2: 34 mmol/L — ABNORMAL HIGH (ref 22–32)
Calcium: 9.4 mg/dL (ref 8.9–10.3)
Chloride: 110 mmol/L (ref 98–111)
Creatinine, Ser: 1.38 mg/dL — ABNORMAL HIGH (ref 0.61–1.24)
GFR, Estimated: 58 mL/min — ABNORMAL LOW (ref >60)
Glucose, Bld: 123 mg/dL — ABNORMAL HIGH (ref 70–99)
Potassium: 4.2 mmol/L (ref 3.5–5.1)
Sodium: 153 mmol/L — ABNORMAL HIGH (ref 135–145)

## 2021-03-08 LAB — GLUCOSE, CAPILLARY
Glucose-Capillary: 112 mg/dL — ABNORMAL HIGH (ref 70–99)
Glucose-Capillary: 121 mg/dL — ABNORMAL HIGH (ref 70–99)
Glucose-Capillary: 101 mg/dL — ABNORMAL HIGH (ref 70–99)
Glucose-Capillary: 123 mg/dL — ABNORMAL HIGH (ref 70–99)
Glucose-Capillary: 120 mg/dL — ABNORMAL HIGH (ref 70–99)
Glucose-Capillary: 114 mg/dL — ABNORMAL HIGH (ref 70–99)

## 2021-03-08 LAB — MAGNESIUM: Magnesium: 2.6 mg/dL — ABNORMAL HIGH (ref 1.7–2.4)

## 2021-03-08 LAB — PHOSPHORUS: Phosphorus: 4.2 mg/dL (ref 2.5–4.6)

## 2021-03-08 MED ORDER — DEXMEDETOMIDINE HCL IN NACL 400 MCG/100ML IV SOLN
0.4000 ug/kg/h | INTRAVENOUS | Status: DC
  Administered 2021-03-08 (×2): 0.8 ug/kg/h via INTRAVENOUS
  Administered 2021-03-08 (×2): 0.4 ug/kg/h via INTRAVENOUS
  Administered 2021-03-09 – 2021-03-12 (×29): 1.2 ug/kg/h via INTRAVENOUS
  Administered 2021-03-12: 1 ug/kg/h via INTRAVENOUS
  Administered 2021-03-12 (×9): 1.2 ug/kg/h via INTRAVENOUS
  Administered 2021-03-13: 0.5 ug/kg/h via INTRAVENOUS
  Administered 2021-03-13: 0.6 ug/kg/h via INTRAVENOUS
  Administered 2021-03-13: 0.7 ug/kg/h via INTRAVENOUS
  Administered 2021-03-13: 0.5 ug/kg/h via INTRAVENOUS
  Administered 2021-03-13: 0.9 ug/kg/h via INTRAVENOUS
  Administered 2021-03-13: 0.7 ug/kg/h via INTRAVENOUS
  Administered 2021-03-14 (×2): 0.5 ug/kg/h via INTRAVENOUS
  Administered 2021-03-14 – 2021-03-15 (×3): 0.6 ug/kg/h via INTRAVENOUS
  Administered 2021-03-15 (×3): 0.5 ug/kg/h via INTRAVENOUS
  Administered 2021-03-15: 0.6 ug/kg/h via INTRAVENOUS
  Administered 2021-03-16 (×2): 0.8 ug/kg/h via INTRAVENOUS
  Administered 2021-03-16: 0.6 ug/kg/h via INTRAVENOUS
  Administered 2021-03-16 (×3): 0.8 ug/kg/h via INTRAVENOUS
  Administered 2021-03-17 (×2): 0.6 ug/kg/h via INTRAVENOUS
  Filled 2021-03-08 (×2): qty 100
  Filled 2021-03-08: qty 200
  Filled 2021-03-08 (×17): qty 100
  Filled 2021-03-08: qty 200
  Filled 2021-03-08 (×12): qty 100
  Filled 2021-03-08: qty 200
  Filled 2021-03-08 (×29): qty 100
  Filled 2021-03-08: qty 200
  Filled 2021-03-08 (×4): qty 100

## 2021-03-08 MED ORDER — FREE WATER
200.0000 mL | Status: DC
  Administered 2021-03-08 – 2021-03-09 (×6): 200 mL

## 2021-03-08 MED ORDER — FUROSEMIDE 10 MG/ML IJ SOLN
40.0000 mg | Freq: Once | INTRAMUSCULAR | Status: AC
  Administered 2021-03-08: 40 mg via INTRAVENOUS
  Filled 2021-03-08: qty 4

## 2021-03-08 NOTE — Progress Notes (Signed)
Patient able to wean off versed and dilaudid infusions today.   Unused dilaudid infusion picked up by Lovina Reach, pharmacy tech at this time. Witnessed by [employer], RN

## 2021-03-08 NOTE — Anesthesia Postprocedure Evaluation (Signed)
Anesthesia Post Note  Patient: Eddie Chapman  Procedure(s) Performed: TRACHEOSTOMY  Patient location during evaluation: SICU Anesthesia Type: General Level of consciousness: sedated and responds to stimulation Pain management: pain level controlled Vital Signs Assessment: post-procedure vital signs reviewed and stable Respiratory status: patient remains intubated per anesthesia plan Cardiovascular status: stable Postop Assessment: no apparent nausea or vomiting Anesthetic complications: no   No notable events documented.   Last Vitals:  Vitals:   03/08/21 0518 03/08/21 0600  BP:  101/64  Pulse:  90  Resp:  15  Temp:  37.7 C  SpO2: 98% 92%    Last Pain:  Vitals:   03/08/21 0600  TempSrc: Axillary  PainSc: 0-No pain                 Lia Foyer

## 2021-03-08 NOTE — Progress Notes (Signed)
Patient left foot noted to be cool to touch, with discoloration & mild swelling. Dorsalis Pedis Pulse noted 1+. Rufina Falco, NP notified. Order placed for a 2 view X-Ray.

## 2021-03-08 NOTE — Progress Notes (Addendum)
GOALS OF CARE DISCUSSION  The Clinical status was relayed to family in detail. GF at bedside  Updated and notified of patients medical condition.  S/p TRACH On PS MODE Alert and awake   Patient with Progressive multiorgan failure with a very high probablity of a very minimal chance of meaningful recovery despite all aggressive and optimal medical therapy.  PATIENT REMAINS DNR STATUS  Family understands the situation. Continue to wean sedation Wean to PS mode Wean fio2  Family are satisfied with Plan of action and management. All questions answered  Additional CC time 15 mins   Aniyah Nobis [name], M.D.  [facility]  Medical Director [place] Director [facility] Cardio-Pulmonary Department

## 2021-03-08 NOTE — Progress Notes (Signed)
Patient's left foot noted to be increasingly cool, dusky with prolonged cap refill overnight. Upon initial assessment this morning, the patient's right foot was assessed and noted to begin appearing dusky and was cooler to touch from previous assessments. Vascular consult order placed for assessment/eval. Patient's significant other visited this afternoon. She was updated on patient's status and plan of care was reviewed. Discussed team's concern over the discoloration of his feet (bilaterally). Patient's significant other stated that this is a chronic problem, and happens spontaneously and intermittently at home. Patient's feet continued to be dusky and cool throughout morning and early afternoon. Around 1500, patient's feet were warmer to touch and pink colored with improving cap refill. No discoloration noted at this time.

## 2021-03-08 NOTE — Progress Notes (Signed)
NAME:  Eddie Chapman, MRN:  [id number], DOB:  1961-08-09, LOS: 50 ADMISSION DATE:  02/23/2021  Patient has a complex comorbid history including systolic CHF chronically, pulmonary hypertension, obstructive sleep apnea, chronic hypoxemia with respiratory failure, obesity hypoventilation syndrome with thoracic restriction, hormonal dysfunction with hypotestosteronism, dyslipidemia, metabolic syndrome, previous subarachnoid hemorrhage, history of traumatic thoracic spinal injury, depression.  Patient was admitted in January to the intensive care unit with altered mental status acute hypoxemic and hypercapnic respiratory failure with metabolic encephalopathy and left lower lobe severe pneumonia.  Throughout hospitalization he was noted to have lethargy with hypercapnic encephalopathy and became unresponsive a few times even postextubation while on medical floor.  Patient has never smoked in the past.   On this admission he is poorly responsive with hypercapnic hypoxemic respiratory failure         Significant Hospital Events: Including procedures, antibiotic start and stop dates in addition to other pertinent events       02/24/21- Patient responded well to mechanical ventilation with improvement in hypercapnia on ABG this am.  Seems that he had THC induced apnea/hypopnea based on laboratory testing.  There is bibasilar atelectasis induced severe hypoxemia, recruitment Metaneb therapy has been ordered. Plan to repeat CXR this afternoon with SBT today.  Reviewed plan with RN and RT today.     02/25/21- patient was unable to pass SBT today. He was able to come off propofol with RASS-0 and is weaned down on levophed.     02/26/21- patient continues to require levophed, he has been difficult to wean. I have asked ID for consultation due to concern for sepsis possibly due to CAP. Unable to perform SBT today. Family at bedside.   02/27/21- patient on PRVC with heavy secretions, difficulty weaning with  high narcotic tolerance. Patient had suicide attempt with requirement for psych.   6/1 failing to wean from vent, DNR status 6/2 failed to wean from vent, severe hypoxia 6/3 severe hypoxia, failure to wean from vent, needs [facility], ENT consulted 6/4 failure to wean from  6/6 failure to wean from vent, plan for [facility] 6/7 failure to wean from vent 6/8 plan for [facility] TODAY 6/9 left foot cyanosis, vasc surgery consulted       MICRO DATA 5/31 SPUTUM CULTURE  ACINETOBACTER CALCOACETICUS/BAUMANNII COMPLEX     Antibiotics Given (last 72 hours)     Date/Time Action Medication Dose Rate   03/05/21 1317 New Bag/Given   Ampicillin-Sulbactam (UNASYN) 3 g in sodium chloride 0.9 % 100 mL IVPB 3 g 200 mL/hr   03/05/21 1817 New Bag/Given   Ampicillin-Sulbactam (UNASYN) 3 g in sodium chloride 0.9 % 100 mL IVPB 3 g 200 mL/hr   03/06/21 0012 New Bag/Given   Ampicillin-Sulbactam (UNASYN) 3 g in sodium chloride 0.9 % 100 mL IVPB 3 g 200 mL/hr   03/06/21 0533 New Bag/Given   Ampicillin-Sulbactam (UNASYN) 3 g in sodium chloride 0.9 % 100 mL IVPB 3 g 200 mL/hr   03/06/21 1140 New Bag/Given   Ampicillin-Sulbactam (UNASYN) 3 g in sodium chloride 0.9 % 100 mL IVPB 3 g 200 mL/hr   03/06/21 1716 New Bag/Given   Ampicillin-Sulbactam (UNASYN) 3 g in sodium chloride 0.9 % 100 mL IVPB 3 g 200 mL/hr   03/06/21 2350 New Bag/Given   Ampicillin-Sulbactam (UNASYN) 3 g in sodium chloride 0.9 % 100 mL IVPB 3 g 200 mL/hr   03/07/21 0519 New Bag/Given   Ampicillin-Sulbactam (UNASYN) 3 g in sodium chloride 0.9 % 100 mL IVPB  3 g 200 mL/hr   03/07/21 1155 New Bag/Given   Ampicillin-Sulbactam (UNASYN) 3 g in sodium chloride 0.9 % 100 mL IVPB 3 g 200 mL/hr   03/07/21 1828 New Bag/Given   Ampicillin-Sulbactam (UNASYN) 3 g in sodium chloride 0.9 % 100 mL IVPB 3 g 200 mL/hr   03/07/21 2316 New Bag/Given   Ampicillin-Sulbactam (UNASYN) 3 g in sodium chloride 0.9 % 100 mL IVPB 3 g 200 mL/hr   03/08/21 0501 New Bag/Given    Ampicillin-Sulbactam (UNASYN) 3 g in sodium chloride 0.9 % 100 mL IVPB 3 g 200 mL/hr        Interim History / Subjective:  Severe resp failure S/p trach Plan for weaning trials      Objective   Blood pressure 101/64, pulse 90, temperature 99.8 F (37.7 C), temperature source Axillary, resp. rate 15, height '5\' 10"'  (1.778 m), weight (!) 156 kg, SpO2 92 %.    Vent Mode: PRVC FiO2 (%):  [40 %-45 %] 40 % Set Rate:  [15 bmp] 15 bmp Vt Set:  [550 mL] 550 mL PEEP:  [5 cmH20-10 cmH20] 5 cmH20   Intake/Output Summary (Last 24 hours) at 03/08/2021 0746 Last data filed at 03/08/2021 2446 Gross per 24 hour  Intake 1989.31 ml  Output 2155 ml  Net -165.69 ml   Filed Weights   03/06/21 0500 03/07/21 0500 03/08/21 0500  Weight: (!) 156 kg (!) 156.8 kg (!) 156 kg      REVIEW OF SYSTEMS  PATIENT IS UNABLE TO PROVIDE COMPLETE REVIEW OF SYSTEMS DUE TO SEVERE CRITICAL ILLNESS AND TOXIC METABOLIC ENCEPHALOPATHY   PHYSICAL EXAMINATION:  GENERAL:critically ill appearing, +resp distress NECK: Supple. S/p trach PULMONARY: +rhonchi, +wheezing CARDIOVASCULAR: S1 and S2. Regular rate and rhythm. No murmurs, rubs, or gallops.  GASTROINTESTINAL: Soft, nontender, -distended. Positive bowel sounds.  MUSCULOSKELETAL: left toe cyanosis  NEUROLOGIC: obtunded SKIN:intact,warm,dry      ASSESSMENT AND PLAN SYNOPSIS     60 yo morbidly obese WM with acute and severe hypoxic and hypercapnic resp failure with CAP with severe toxic metabolic encephalopathy, failure to wean from vent +ACINOTOBACTER SPECIES PNEUMONIA    Severe ACUTE Hypoxic and Hypercapnic Respiratory Failure -continue Mechanical Ventilator support -continue Bronchodilator Therapy -Wean Fio2 and PEEP as tolerated -VAP/VENT bundle implementation -will perform SAT/SBT when respiratory parameters are met S/p trach   SEVERE COPD EXACERBATION -continue IV steroids as prescribed -continue NEB THERAPY as prescribed -wean fio2 as  needed and tolerated   CARDIAC ICU monitoring   ACUTE KIDNEY INJURY/Renal Failure -continue Foley Catheter-assess need -Avoid nephrotoxic agents -Follow urine output, BMP -Ensure adequate renal perfusion, optimize oxygenation -Renal dose medications   NEUROLOGY Acute toxic metabolic encephalopathy, need for sedation Goal RASS -2 to -3   SEPTIC SHOCK -use vasopressors to keep MAP>65 as needed   INFECTIOUS DISEASE ACINETOBACTER CALCOACETICUS/BAUMANNII COMPLEX  PNEUMONIA -continue antibiotics as prescribed -follow up cultures -follow up ID consultation  ENDO - ICU hypoglycemic\Hyperglycemia protocol -check FSBS per protocol   GI GI PROPHYLAXIS as indicated  NUTRITIONAL STATUS DIET-->TF's as tolerated Constipation protocol as indicated   ELECTROLYTES -follow labs as needed -replace as needed -pharmacy consultation and following      Best practice (right click and "Reselect all SmartList Selections" daily)  Diet:  Tube Feed  Pain/Anxiety/Delirium protocol (if indicated): Yes (RASS goal -2) VAP protocol (if indicated): Yes DVT prophylaxis: Subcutaneous Heparin GI prophylaxis: PPI Glucose control:  SSI Yes Central venous access:  Yes, and it is still needed Arterial line:  N/A  Foley:  Yes, and it is still needed Mobility:  bed rest  PT consulted: N/A Code Status:  full code Disposition: ICU    Labs   CBC: Recent Labs  Lab 03/02/21 1021 03/03/21 0322 03/04/21 0431 03/05/21 0415 03/06/21 0340  WBC 11.4* 9.6 8.6 8.8 8.0  NEUTROABS  --   --  6.9  --   --   HGB 12.4* 10.6* 10.7* 10.6* 10.0*  HCT 39.3 34.3* 34.5* 34.4* 32.8*  MCV 103.4* 104.3* 105.2* 105.8* 107.2*  PLT 156 150 149* 150 140*    Basic Metabolic Panel: Recent Labs  Lab 03/04/21 0431 03/05/21 0415 03/06/21 0340 03/07/21 0516 03/08/21 0506  NA 141 144 145 151* 153*  K 4.1 3.9 3.9 4.4 4.2  CL 101 104 107 106 110  CO2 33* 32 34* 36* 34*  GLUCOSE 208* 106* 105* 100* 123*   BUN 55* 60* 60* 59* 53*  CREATININE 1.21 1.22 1.24 1.22 1.38*  CALCIUM 8.4* 8.1* 8.3* 9.4 9.4  MG 2.4 2.6* 2.5* 2.6* 2.6*  PHOS 4.2 3.9 3.8 3.9 4.2   GFR: Estimated Creatinine Clearance: 84.4 mL/min (A) (by C-G formula based on SCr of 1.38 mg/dL (H)). Recent Labs  Lab 03/02/21 1021 03/03/21 0322 03/04/21 0431 03/05/21 0415 03/06/21 0340  PROCALCITON <0.10  --   --   --   --   WBC 11.4* 9.6 8.6 8.8 8.0    Liver Function Tests: No results for input(s): AST, ALT, ALKPHOS, BILITOT, PROT, ALBUMIN in the last 168 hours. No results for input(s): LIPASE, AMYLASE in the last 168 hours. No results for input(s): AMMONIA in the last 168 hours.  ABG    Component Value Date/Time   PHART 7.40 03/02/2021 2000   PCO2ART 69 (HH) 03/02/2021 2000   PO2ART 137 (H) 03/02/2021 2000   HCO3 42.7 (H) 03/02/2021 2000   O2SAT 99.1 03/02/2021 2000     Coagulation Profile: No results for input(s): INR, PROTIME in the last 168 hours.  Cardiac Enzymes: No results for input(s): CKTOTAL, CKMB, CKMBINDEX, TROPONINI in the last 168 hours.  HbA1C: Hgb A1c MFr Bld  Date/Time Value Ref Range Status  03/02/2021 04:52 AM 5.9 (H) 4.8 - 5.6 % Final    Comment:    (NOTE)         Prediabetes: 5.7 - 6.4         Diabetes: >6.4         Glycemic control for adults with diabetes: <7.0     CBG: Recent Labs  Lab 03/07/21 1536 03/07/21 1919 03/07/21 2309 03/08/21 0341 03/08/21 0724  GLUCAP 111* 89 95 112* 121*    Allergies Allergies  Allergen Reactions   Divalproex Sodium Other (See Comments)    Elevated liver enzymes   Valproic Acid     unknown       DVT/GI PRX  assessed I Assessed the need for Labs I Assessed the need for Foley I Assessed the need for Central Venous Line Family Discussion when available I Assessed the need for Mobilization I made an Assessment of medications to be adjusted accordingly Safety Risk assessment completed  CASE DISCUSSED IN MULTIDISCIPLINARY ROUNDS WITH  ICU TEAM     Critical Care Time devoted to patient care services described in this note is 50 minutes.  Critical care was necessary to treat or prevent imminent or life-threatening deterioration. Overall, patient is critically ill, prognosis is guarded.  Patient with Multiorgan failure and at high risk for cardiac arrest and death.  Corrin Parker, M.D.  [facility]  Medical Director [place] Director [facility] Cardio-Pulmonary Department

## 2021-03-09 DIAGNOSIS — J158 Pneumonia due to other specified bacteria: Secondary | ICD-10-CM

## 2021-03-09 LAB — CBC
HCT: 39.8 % (ref 39.0–52.0)
Hemoglobin: 12.5 g/dL — ABNORMAL LOW (ref 13.0–17.0)
MCH: 32.6 pg (ref 26.0–34.0)
MCHC: 31.4 g/dL (ref 30.0–36.0)
MCV: 103.6 fL — ABNORMAL HIGH (ref 80.0–100.0)
Platelets: 176 10*3/uL (ref 150–400)
RBC: 3.84 MIL/uL — ABNORMAL LOW (ref 4.22–5.81)
RDW: 13.2 % (ref 11.5–15.5)
WBC: 10.3 10*3/uL (ref 4.0–10.5)
nRBC: 0 % (ref 0.0–0.2)

## 2021-03-09 LAB — BASIC METABOLIC PANEL
Anion gap: 11 (ref 5–15)
BUN: 47 mg/dL — ABNORMAL HIGH (ref 8–23)
CO2: 36 mmol/L — ABNORMAL HIGH (ref 22–32)
Calcium: 9.7 mg/dL (ref 8.9–10.3)
Chloride: 106 mmol/L (ref 98–111)
Creatinine, Ser: 1.24 mg/dL (ref 0.61–1.24)
GFR, Estimated: >60 mL/min (ref >60)
Glucose, Bld: 155 mg/dL — ABNORMAL HIGH (ref 70–99)
Potassium: 3.8 mmol/L (ref 3.5–5.1)
Sodium: 153 mmol/L — ABNORMAL HIGH (ref 135–145)

## 2021-03-09 LAB — MAGNESIUM: Magnesium: 2.3 mg/dL (ref 1.7–2.4)

## 2021-03-09 LAB — GLUCOSE, CAPILLARY
Glucose-Capillary: 130 mg/dL — ABNORMAL HIGH (ref 70–99)
Glucose-Capillary: 127 mg/dL — ABNORMAL HIGH (ref 70–99)
Glucose-Capillary: 135 mg/dL — ABNORMAL HIGH (ref 70–99)
Glucose-Capillary: 142 mg/dL — ABNORMAL HIGH (ref 70–99)
Glucose-Capillary: 121 mg/dL — ABNORMAL HIGH (ref 70–99)
Glucose-Capillary: 86 mg/dL (ref 70–99)

## 2021-03-09 LAB — PHOSPHORUS: Phosphorus: 3.9 mg/dL (ref 2.5–4.6)

## 2021-03-09 MED ORDER — OXYCODONE HCL 5 MG PO TABS
10.0000 mg | ORAL_TABLET | Freq: Four times a day (QID) | ORAL | Status: DC
  Administered 2021-03-09 – 2021-03-14 (×19): 10 mg
  Filled 2021-03-09 (×19): qty 2

## 2021-03-09 MED ORDER — FREE WATER
250.0000 mL | Status: DC
  Administered 2021-03-09 – 2021-03-10 (×9): 250 mL

## 2021-03-09 MED ORDER — QUETIAPINE FUMARATE 25 MG PO TABS
25.0000 mg | ORAL_TABLET | Freq: Every day | ORAL | Status: DC
  Administered 2021-03-09 – 2021-03-11 (×3): 25 mg
  Filled 2021-03-09 (×3): qty 1

## 2021-03-09 MED ORDER — OXYCODONE HCL 5 MG PO TABS
5.0000 mg | ORAL_TABLET | Freq: Four times a day (QID) | ORAL | Status: DC
  Administered 2021-03-09: 5 mg
  Filled 2021-03-09: qty 1

## 2021-03-09 MED ORDER — MIDAZOLAM HCL 2 MG/2ML IJ SOLN
2.0000 mg | INTRAMUSCULAR | Status: AC
  Administered 2021-03-09: 2 mg via INTRAVENOUS

## 2021-03-09 MED ORDER — DIAZEPAM 5 MG PO TABS
5.0000 mg | ORAL_TABLET | Freq: Four times a day (QID) | ORAL | Status: DC
  Administered 2021-03-09 – 2021-03-11 (×11): 5 mg
  Filled 2021-03-09 (×11): qty 1

## 2021-03-09 MED ORDER — QUETIAPINE FUMARATE 25 MG PO TABS: 25.0000 mg | ORAL_TABLET | Freq: Every day | ORAL | Status: DC

## 2021-03-09 MED ORDER — MIDAZOLAM HCL 2 MG/2ML IJ SOLN
4.0000 mg | Freq: Once | INTRAMUSCULAR | Status: AC
  Administered 2021-03-09: 4 mg via INTRAVENOUS
  Filled 2021-03-09: qty 4

## 2021-03-09 NOTE — TOC Progression Note (Addendum)
Transition of Care [facility]) - Progression Note    Patient Details  Name: Eddie Chapman MRN: [id number] Date of Birth: [birth year]  Transition of Care [facility]) CM/SW [place], [place] Phone Number: 03/09/2021, 2:51 PM  Clinical Narrative:      SW updated Dr. Patsey Berthold with the peer to peer request and information. Peer to peer timeline contact by 12:000PM on 03/09/2021.  Dr. Patsey Berthold confirmed receipt of contact information and timeline for peer to peer.       Expected Discharge Plan and Services                                                 Social Determinants of Health (SDOH) Interventions    Readmission Risk Interventions No flowsheet data found.

## 2021-03-09 NOTE — Progress Notes (Signed)
Met girlfriend of patient in lobby area. Patient has been her for 14 days now, he has a trach now, is awake and working with physical therapy. Girlfriend has maintained presence daily since he has been here and believes his progress is answered prayers.

## 2021-03-09 NOTE — Evaluation (Signed)
Occupational Therapy Evaluation Patient Details Name: Eddie Chapman MRN: [id number] DOB: Apr 11, 1961 Today's Date: 03/09/2021    History of Present Illness Eddie Chapman is a 60 y.o. with a complex medical history including systolic CHF chronically, pulmonary hypertension, obstructive sleep apnea, chronic hypoxemia with respiratory failure, obesity hypoventilation syndrome with thoracic restriction, hormonal dysfunction with hypotestosteronism, dyslipidemia, metabolic syndrome, previous subarachnoid hemorrhage, history of traumatic thoracic spinal injury, & depression. He presented to [facility] ED on 02/23/21 with c/o of confusion & SOB for at least 3 days. Pt was admitted to CCU for ARF. he failed extubation and had trach placed on 03/07/21.   Clinical Impression   Eddie Chapman was seen for OT evaluation this date. Prior to hospital admission, pt was living at home with his significant other. He is unable to provide information regarding PLOF 2/2 tracheostomy and cognitive status at time of evaluation. Per significant other, pt was ambulating short distances without AE, but would use a RW for occasional household mobility. He also occasionally requires assistance to complete ADL management including assistance with threading his pants or LB access for bathing at baseline. Pt currently requires TOTAL assist for bed level ADL management. He demonstrates impairments as described below (See OT problem list) which functionally limit his ability to perform ADL/self-care tasks.  Pt would benefit from skilled OT services to address noted impairments and functional limitations (see below for any additional details) in order to maximize safety and independence while minimizing falls risk and caregiver burden. Upon hospital discharge, recommend LTACH.     Follow Up Recommendations  LTACH    Equipment Recommendations  Other (comment) (TBD)    Recommendations for Other Services       Precautions / Restrictions  Precautions Precautions: Fall Precaution Comments: Data processing manager, sitter. Restrictions Weight Bearing Restrictions: No      Mobility Bed Mobility Overal bed mobility: Needs Assistance Bed Mobility: Rolling Rolling: Max assist;+2 for physical assistance         General bed mobility comments: Max +2 assist to boost up in bed.    Transfers                 General transfer comment: Deferred for pt safety.    Balance Overall balance assessment: Needs assistance                                         ADL either performed or assessed with clinical judgement   ADL Overall ADL's : Needs assistance/impaired                                       General ADL Comments: Pt significantly functionally limited by decreased cognition, cardiopulmonary status, generalized weakness, and significant decreased activity tolerance. He is currently bed level for all ADL management with trach in place, attempting to wean to trach collar. Attempts at functional communication with thumbs up/down signals this date result in limited successful communication. Pt attempts to talk t/o session and does become frustrated with lack of functional communication.     Vision         Perception     Praxis      Pertinent Vitals/Pain Pain Assessment: Faces Faces Pain Scale: Hurts little more Pain Location: Gestures toward abdomen when asked about pain. Pain Intervention(s): Monitored during session;Repositioned  Hand Dominance Right   Extremity/Trunk Assessment Upper Extremity Assessment Upper Extremity Assessment: Generalized weakness;Difficult to assess due to impaired cognition (Increased edema appreciated over dorsum of RUE.)   Lower Extremity Assessment Lower Extremity Assessment: Generalized weakness;Difficult to assess due to impaired cognition       Communication Communication Communication: No difficulties   Cognition Arousal/Alertness:  Lethargic Behavior During Therapy: Restless Overall Cognitive Status: Difficult to assess Area of Impairment: Safety/judgement;Following commands                       Following Commands: Follows one step commands inconsistently Safety/Judgement: Decreased awareness of safety;Decreased awareness of deficits         General Comments       Exercises Other Exercises Other Exercises: Pt education limited 2/2 cognitive status. Attempts made to support pt functional communication and participation in care. Caregiver educated on strategies for management of appropriate sleep-wake cycles to minimize risk of hospital acquired confusion.   Shoulder Instructions      Home Living Family/patient expects to be discharged to:: Private residence Living Arrangements: Spouse/significant other Available Help at Discharge: Family Type of Home: House       Home Layout: One level     Bathroom Shower/Tub: Teacher, early years/pre: [place] - single point;Bedside commode;Walker - 2 wheels          Prior Functioning/Environment Level of Independence: Needs assistance  Gait / Transfers Assistance Needed: Occasionally using RW for longer distances, per significant other. ADL's / Homemaking Assistance Needed: Per SO, pt requires occsional assistance for LB access during ADL management.            OT Problem List: Decreased strength;Decreased coordination;Cardiopulmonary status limiting activity;Decreased range of motion;Decreased cognition;Decreased activity tolerance;Decreased safety awareness;Impaired balance (sitting and/or standing);Decreased knowledge of use of DME or AE;Impaired UE functional use;Obesity      OT Treatment/Interventions: Self-care/ADL training;Therapeutic exercise;Therapeutic activities;Energy conservation;Visual/perceptual remediation/compensation;DME and/or AE instruction;Balance training;Patient/family education;Manual  therapy;Modalities;Cognitive remediation/compensation;Neuromuscular education    OT Goals(Current goals can be found in the care plan section) Acute Rehab OT Goals Patient Stated Goal: None stated OT Goal Formulation: With patient/family Time For Goal Achievement: 03/23/21 Potential to Achieve Goals: Good ADL Goals Pt Will Perform Grooming: bed level;with min assist Pt Will Transfer to Toilet: stand pivot transfer;with mod assist;bedside commode Pt Will Perform Toileting - Clothing Manipulation and hygiene: with adaptive equipment;sit to/from stand;with mod assist  OT Frequency: Min 2X/week   Barriers to D/C: Inaccessible home environment;Decreased caregiver support          Co-evaluation              AM-PAC OT "6 Clicks" Daily Activity     Outcome Measure Help from another person eating meals?: Total (Based on function, currently has NG tube.) Help from another person taking care of personal grooming?: A Lot Help from another person toileting, which includes using toliet, bedpan, or urinal?: Total Help from another person bathing (including washing, rinsing, drying)?: Total Help from another person to put on and taking off regular upper body clothing?: A Lot Help from another person to put on and taking off regular lower body clothing?: Total 6 Click Score: 8   End of Session Equipment Utilized During Treatment: Gait belt;Rolling walker Nurse Communication: Mobility status  Activity Tolerance: Patient tolerated treatment well Patient left: in bed;with call bell/phone within reach;with nursing/sitter in room;with family/visitor present  OT Visit Diagnosis: Other abnormalities of  gait and mobility (R26.89);Muscle weakness (generalized) (M62.81);Other symptoms and signs involving cognitive function                Time: 1441-1516 OT Time Calculation (min): 35 min Charges:  OT General Charges $OT Visit: 1 Visit OT Evaluation $OT Eval Moderate Complexity: 1 Mod OT  Treatments $Self Care/Home Management : 8-22 mins  Shara Blazing, M.S., OTR/L Ascom: [phone] 03/09/21, 4:17 PM

## 2021-03-09 NOTE — Progress Notes (Signed)
NAME:  Eddie Chapman, MRN:  [id number], DOB:  12/27/1960, LOS: 68 ADMISSION DATE:  02/23/2021  Brief Pt Description / Synopsis:  60 yo morbidly obese male with acute on chronic hypoxic and hypercapnic respiratory failure due to [place] and AECOPD, along with severe toxic metabolic encephalopathy.  Failure to wean from vent requiring Tracheostomy on 03/07/2021.  History of Present Illness:   Patient has a complex comorbid history including systolic CHF chronically, pulmonary hypertension, obstructive sleep apnea, chronic hypoxemia with respiratory failure, obesity hypoventilation syndrome with thoracic restriction, hormonal dysfunction with hypotestosteronism, dyslipidemia, metabolic syndrome, previous subarachnoid hemorrhage, history of traumatic thoracic spinal injury, depression.  Patient was admitted in January to the intensive care unit with altered mental status acute hypoxemic and hypercapnic respiratory failure with metabolic encephalopathy and left lower lobe severe pneumonia.  Throughout hospitalization he was noted to have lethargy with hypercapnic encephalopathy and became unresponsive a few times even postextubation while on medical floor.  Patient has never smoked in the past.   On this admission he is poorly responsive with hypercapnic hypoxemic respiratory failure         Significant Hospital Events: Including procedures, antibiotic start and stop dates in addition to other pertinent events       02/24/21- Patient responded well to mechanical ventilation with improvement in hypercapnia on ABG this am.  Seems that he had THC induced apnea/hypopnea based on laboratory testing.  There is bibasilar atelectasis induced severe hypoxemia, recruitment Metaneb therapy has been ordered. Plan to repeat CXR this afternoon with SBT today.  Reviewed plan with RN and RT today.     02/25/21- patient was unable to pass SBT today. He was able to come off propofol with  RASS-0 and is weaned down on levophed.     02/26/21- patient continues to require levophed, he has been difficult to wean. I have asked ID for consultation due to concern for sepsis possibly due to CAP. Unable to perform SBT today. Family at bedside.   02/27/21- patient on PRVC with heavy secretions, difficulty weaning with high narcotic tolerance. Patient had suicide attempt with requirement for psych.   6/1 failing to wean from vent, DNR status 6/2 failed to wean from vent, severe hypoxia 6/3 severe hypoxia, failure to wean from vent, needs [facility], ENT consulted 6/4 failure to wean from  6/6 failure to wean from vent, plan for [facility] 6/7 failure to wean from vent 6/8 plan for [facility] TODAY 6/9 left foot cyanosis, vasc surgery consulted 6/10: Agitation overnight, added oxycodone, valium, seroquel.  SBT in progress.  Working with PT      Micro Data:  5/27: SARS-CoV-2 and influenza PCR>> negative 5/27: Blood culture x2>> no growth 5/27: Urine>> no growth 5/27: MRSA PCR>> negative 5/28: Sputum>> normal respiratory flora 5/30: Respiratory viral panel>> negative 5/31: SPUTUM CULTURE >> ACINETOBACTER CALCOACETICUS/BAUMANNII COMPLEX     Antimicrobials:  Azithromycin 5/27>>5/29 Ceftriaxone 5/27>>5/29 Vancomycin 5/29>>5/30 Zosyn 5/29>>6/1 Unasyn 6/1>>  Antibiotics Given (last 72 hours)     Date/Time Action Medication Dose Rate   03/06/21 1716 New Bag/Given   Ampicillin-Sulbactam (UNASYN) 3 g in sodium chloride 0.9 % 100 mL IVPB 3 g 200 mL/hr   03/06/21 2350 New Bag/Given   Ampicillin-Sulbactam (UNASYN) 3 g in sodium chloride 0.9 % 100 mL IVPB 3 g 200 mL/hr   03/07/21 0519 New Bag/Given   Ampicillin-Sulbactam (UNASYN) 3 g in sodium chloride 0.9 % 100 mL IVPB 3 g 200 mL/hr   03/07/21 1155 New Bag/Given  Ampicillin-Sulbactam (UNASYN) 3 g in sodium chloride 0.9 % 100 mL IVPB 3 g 200 mL/hr   03/07/21 1828 New Bag/Given   Ampicillin-Sulbactam (UNASYN) 3 g in sodium chloride 0.9 % 100 mL  IVPB 3 g 200 mL/hr   03/07/21 2316 New Bag/Given   Ampicillin-Sulbactam (UNASYN) 3 g in sodium chloride 0.9 % 100 mL IVPB 3 g 200 mL/hr   03/08/21 0501 New Bag/Given   Ampicillin-Sulbactam (UNASYN) 3 g in sodium chloride 0.9 % 100 mL IVPB 3 g 200 mL/hr   03/08/21 1235 New Bag/Given   Ampicillin-Sulbactam (UNASYN) 3 g in sodium chloride 0.9 % 100 mL IVPB 3 g 200 mL/hr   03/08/21 1849 New Bag/Given   Ampicillin-Sulbactam (UNASYN) 3 g in sodium chloride 0.9 % 100 mL IVPB 3 g 200 mL/hr   03/08/21 2352 New Bag/Given   Ampicillin-Sulbactam (UNASYN) 3 g in sodium chloride 0.9 % 100 mL IVPB 3 g 200 mL/hr   03/09/21 0556 New Bag/Given   Ampicillin-Sulbactam (UNASYN) 3 g in sodium chloride 0.9 % 100 mL IVPB 3 g 200 mL/hr   03/09/21 1138 New Bag/Given   Ampicillin-Sulbactam (UNASYN) 3 g in sodium chloride 0.9 % 100 mL IVPB 3 g 200 mL/hr        Interim History / Subjective:  -Agitation overnight~ added scheduled oxycodone, valium, seroquel.   -SBT in progress  -Working with PT -Pt now hypertensive, d/c midodrine -Serum Na+ remains elevated @ 153, will increase free water flushes  -Duskiness to lower extremities that was reported yesterday has now resolved; pt's significant other reports this is chronic for him per nursing report    Objective   Blood pressure (!) 156/94, pulse 80, temperature 99.7 F (37.6 C), resp. rate 14, height 5\' 10"  (1.778 m), weight (!) 154.4 kg, SpO2 96 %.    Vent Mode: PSV FiO2 (%):  [40 %-50 %] 40 % Set Rate:  [15 bmp] 15 bmp Vt Set:  [550 mL] 550 mL PEEP:  [5 cmH20] 5 cmH20 Pressure Support:  [10 cmH20] 10 cmH20   Intake/Output Summary (Last 24 hours) at 03/09/2021 1225 Last data filed at 03/09/2021 1136 Gross per 24 hour  Intake 2799.66 ml  Output 4050 ml  Net -1250.34 ml    Filed Weights   03/07/21 0500 03/08/21 0500 03/09/21 0427  Weight: (!) 156.8 kg (!) 156 kg (!) 154.4 kg      REVIEW OF SYSTEMS PATIENT IS UNABLE TO PROVIDE COMPLETE REVIEW OF  SYSTEMS DUE TO SEVERE CRITICAL ILLNESS AND TOXIC METABOLIC ENCEPHALOPATHY   PHYSICAL EXAMINATION:  GENERAL: Acute on chronically ill-appearing male, laying in bed, undergoing spontaneous breathing trial, no acute distress NECK: Supple.  No, JVD, tracheostomy midline PULMONARY: Mechanical breath sounds, mild tachypnea, even CARDIOVASCULAR: S1 and S2. Regular rate and rhythm. No murmurs, rubs, or gallops.  GASTROINTESTINAL: Obese, soft, nontender, nondistended, no guarding or rebound tenderness, bowel sounds positive x4 MUSCULOSKELETAL: Previously reported cyanosis of the lower extremities is currently resolved, normal bulk and tone, no deformities NEUROLOGIC: Lightly sedated, arouses to voice, will intermittently move all extremities to command, no focal deficits SKIN: Warm and dry.  No obvious rashes, lesions, ulcerations     Assessment / Plan:     Acute on Chronic Hypoxic and Hypercapnic Respiratory Failure in the setting of [place] and AECOPD -Unable to wean from vent ~ S/P tracheostomy on 03/07/21 -Full vent support, implement lung protective strategies -Wean FiO2 and PEEP as tolerated to maintain O2 sats 88 to 92% -Follow intermittent chest  x-ray and ABG as needed -Spontaneous breathing trial/trach collar trials as tolerated -Bronchodilators and budesonide nebs -Continue Unasyn  ACINETOBACTER CALCOACETICUS/BAUMANNII COMPLEX PNEUMONIA -Monitor fever curve -Trend WBCs -Follow cultures as above -To complete course of Unasyn  Septic Shock>>resolved PMHx of Hypertension -Continuous cardiac monitoring -Maintain MAP greater than 65 -Vasopressors weaned off -Patient now hypertensive, will discontinue midodrine  Acute toxic metabolic encephalopathy, need for sedation -Maintain RASS goal of 0 to -1 -Dilaudid and Precedex drips as needed -Avoid sedating medications as able -Add scheduled oxycodone, Valium, Seroquel -Provide supportive care, encourage  family presence -Promote normal sleep/wake cycle -PT/OT consults           Best practice (right click and "Reselect all SmartList Selections" daily)  Diet:  Tube Feed  Pain/Anxiety/Delirium protocol (if indicated): Yes (RASS goal 0 to -1 ) VAP protocol (if indicated): Yes DVT prophylaxis: Subcutaneous Heparin GI prophylaxis: PPI Glucose control:  SSI Yes Central venous access:  Yes, and it is still needed Arterial line:  N/A Foley:  N/A Mobility:  bed rest  PT consulted: yes Code Status:  DNR Disposition: ICU    Labs   CBC: Recent Labs  Lab 03/03/21 0322 03/04/21 0431 03/05/21 0415 03/06/21 0340 03/09/21 0423  WBC 9.6 8.6 8.8 8.0 10.3  NEUTROABS  --  6.9  --   --   --   HGB 10.6* 10.7* 10.6* 10.0* 12.5*  HCT 34.3* 34.5* 34.4* 32.8* 39.8  MCV 104.3* 105.2* 105.8* 107.2* 103.6*  PLT 150 149* 150 140* 176     Basic Metabolic Panel: Recent Labs  Lab 03/05/21 0415 03/06/21 0340 03/07/21 0516 03/08/21 0506 03/09/21 0423  NA 144 145 151* 153* 153*  K 3.9 3.9 4.4 4.2 3.8  CL 104 107 106 110 106  CO2 32 34* 36* 34* 36*  GLUCOSE 106* 105* 100* 123* 155*  BUN 60* 60* 59* 53* 47*  CREATININE 1.22 1.24 1.22 1.38* 1.24  CALCIUM 8.1* 8.3* 9.4 9.4 9.7  MG 2.6* 2.5* 2.6* 2.6* 2.3  PHOS 3.9 3.8 3.9 4.2 3.9    GFR: Estimated Creatinine Clearance: 93.4 mL/min (by C-G formula based on SCr of 1.24 mg/dL). Recent Labs  Lab 03/04/21 0431 03/05/21 0415 03/06/21 0340 03/09/21 0423  WBC 8.6 8.8 8.0 10.3     Liver Function Tests: No results for input(s): AST, ALT, ALKPHOS, BILITOT, PROT, ALBUMIN in the last 168 hours. No results for input(s): LIPASE, AMYLASE in the last 168 hours. No results for input(s): AMMONIA in the last 168 hours.  ABG    Component Value Date/Time   PHART 7.40 03/02/2021 2000   PCO2ART 69 (HH) 03/02/2021 2000   PO2ART 137 (H) 03/02/2021 2000   HCO3 42.7 (H) 03/02/2021 2000   O2SAT 99.1 03/02/2021 2000      Coagulation  Profile: No results for input(s): INR, PROTIME in the last 168 hours.  Cardiac Enzymes: No results for input(s): CKTOTAL, CKMB, CKMBINDEX, TROPONINI in the last 168 hours.  HbA1C: Hgb A1c MFr Bld  Date/Time Value Ref Range Status  03/02/2021 04:52 AM 5.9 (H) 4.8 - 5.6 % Final    Comment:    (NOTE)         Prediabetes: 5.7 - 6.4         Diabetes: >6.4         Glycemic control for adults with diabetes: <7.0     CBG: Recent Labs  Lab 03/08/21 1919 03/08/21 2328 03/09/21 0322 03/09/21 0731 03/09/21 1129  GLUCAP 120* 114* 130* 127* 135*  Allergies Allergies  Allergen Reactions   Divalproex Sodium Other (See Comments)    Elevated liver enzymes   Valproic Acid     unknown        Critical Care Time: 35 minutes  Darel Hong, AGACNP-BC [facility] Prefer epic messenger for cross cover needs If after hours, please call E-link

## 2021-03-09 NOTE — TOC Progression Note (Signed)
Transition of Care [facility]) - Progression Note    Patient Details  Name: Eddie Chapman MRN: [id number] Date of Birth: [birth year]  Transition of Care [facility]) CM/SW [place], [place] Phone Number: [phone] 03/09/2021, 2:47 PM  Clinical Narrative:     Danise Mina from [place] contacted this CSW with insurance authorization update.  Jenn stated insurance has denied authorization and they have requested a peer to peer.  CSW updated Dr. Mortimer Fries and sent him the peer to peer information. Peer to peer time contact need to occur by 12:000PM on 03/09/2021.  Dr. Mortimer Fries confirmed receipt of contact information and timeline for peer to peer.       Expected Discharge Plan and Services                                                 Social Determinants of Health (SDOH) Interventions    Readmission Risk Interventions No flowsheet data found.

## 2021-03-09 NOTE — Progress Notes (Signed)
Patient noted very agitated throughout the shift. PRN Versed administered multiple times, Stat Versed ordered multiple times & Precedex maxed out with no change in patient's behavior. Patient attempted to slide himself out of bed, throwing his legs over the bed railing. Patient redirected each time prior to administering PRN meds. Hinton Dyer, NP notified and ordered for me to restart the Dilaudid drip. Patient behavior increasingly got worse while maxed out on Precedex & Dilaudid. Orders given to administer Versed 4mg , Valium 5mg  & Oxycodone 5mg . Medication noted effective at this time.

## 2021-03-10 LAB — GLUCOSE, CAPILLARY
Glucose-Capillary: 109 mg/dL — ABNORMAL HIGH (ref 70–99)
Glucose-Capillary: 110 mg/dL — ABNORMAL HIGH (ref 70–99)
Glucose-Capillary: 110 mg/dL — ABNORMAL HIGH (ref 70–99)
Glucose-Capillary: 111 mg/dL — ABNORMAL HIGH (ref 70–99)
Glucose-Capillary: 124 mg/dL — ABNORMAL HIGH (ref 70–99)
Glucose-Capillary: 132 mg/dL — ABNORMAL HIGH (ref 70–99)
Glucose-Capillary: 141 mg/dL — ABNORMAL HIGH (ref 70–99)

## 2021-03-10 LAB — CBC
HCT: 37.5 % — ABNORMAL LOW (ref 39.0–52.0)
Hemoglobin: 11.4 g/dL — ABNORMAL LOW (ref 13.0–17.0)
MCH: 32.4 pg (ref 26.0–34.0)
MCHC: 30.4 g/dL (ref 30.0–36.0)
MCV: 106.5 fL — ABNORMAL HIGH (ref 80.0–100.0)
Platelets: 152 10*3/uL (ref 150–400)
RBC: 3.52 MIL/uL — ABNORMAL LOW (ref 4.22–5.81)
RDW: 13.2 % (ref 11.5–15.5)
WBC: 8 10*3/uL (ref 4.0–10.5)
nRBC: 0 % (ref 0.0–0.2)

## 2021-03-10 LAB — BASIC METABOLIC PANEL
Anion gap: 6 (ref 5–15)
BUN: 33 mg/dL — ABNORMAL HIGH (ref 8–23)
CO2: 34 mmol/L — ABNORMAL HIGH (ref 22–32)
Calcium: 8.3 mg/dL — ABNORMAL LOW (ref 8.9–10.3)
Chloride: 112 mmol/L — ABNORMAL HIGH (ref 98–111)
Creatinine, Ser: 0.99 mg/dL (ref 0.61–1.24)
GFR, Estimated: >60 mL/min (ref >60)
Glucose, Bld: 131 mg/dL — ABNORMAL HIGH (ref 70–99)
Potassium: 3.5 mmol/L (ref 3.5–5.1)
Sodium: 152 mmol/L — ABNORMAL HIGH (ref 135–145)

## 2021-03-10 LAB — PHOSPHORUS: Phosphorus: 3.7 mg/dL (ref 2.5–4.6)

## 2021-03-10 LAB — MAGNESIUM: Magnesium: 1.8 mg/dL (ref 1.7–2.4)

## 2021-03-10 MED ORDER — IPRATROPIUM-ALBUTEROL 0.5-2.5 (3) MG/3ML IN SOLN
3.0000 mL | Freq: Four times a day (QID) | RESPIRATORY_TRACT | Status: DC
  Administered 2021-03-10 – 2021-03-12 (×8): 3 mL via RESPIRATORY_TRACT
  Filled 2021-03-10 (×8): qty 3

## 2021-03-10 MED ORDER — FREE WATER
250.0000 mL | Status: DC
  Administered 2021-03-10 – 2021-03-12 (×21): 250 mL

## 2021-03-10 MED ORDER — ENOXAPARIN SODIUM 80 MG/0.8ML IJ SOSY
0.5000 mg/kg | PREFILLED_SYRINGE | INTRAMUSCULAR | Status: DC
  Administered 2021-03-10 – 2021-04-04 (×26): 75 mg via SUBCUTANEOUS
  Filled 2021-03-10 (×12): qty 0.8
  Filled 2021-03-10 (×2): qty 0.75
  Filled 2021-03-10 (×4): qty 0.8
  Filled 2021-03-10: qty 0.75
  Filled 2021-03-10 (×6): qty 0.8
  Filled 2021-03-10 (×2): qty 0.75

## 2021-03-10 NOTE — Progress Notes (Signed)
PT Cancellation Note  Patient Details Name: Eddie Chapman MRN: [id number] DOB: 1960-11-06   Cancelled Treatment:    Reason Eval/Treat Not Completed: Other (comment) PT orders received, chart reviewed. Spoke with nurse who reports pt is very anxious & agitated at this time & request to hold PT intervention. Will f/u as able & as pt is appropriate for PT intervention.  Lavone Nian, PT, DPT 03/10/21, 11:22 AM    Waunita Schooner 03/10/2021, 11:22 AM

## 2021-03-10 NOTE — Progress Notes (Signed)
Pt very anxious/agitated/aggressive @ outset of shift. PRN Versed given mx times w/ positive results. External catheter changed. Full linen change completed d/t urine on pad and bottom sheet. No BM. Pt remains on Precedex and Dilaudid. Tube feeds continue to infuse via NG tube. Turn q2. Pt in no acute distress @ this time. Will continue to monitor.

## 2021-03-10 NOTE — Progress Notes (Signed)
NAME:  Eddie Chapman, MRN:  [id number], DOB:  1960-12-12, LOS: 30 ADMISSION DATE:  02/23/2021  Brief Pt Description / Synopsis:  60 yo morbidly obese male with acute on chronic hypoxic and hypercapnic respiratory failure due to [place] and AECOPD, along with severe toxic metabolic encephalopathy.  Failure to wean from vent requiring Tracheostomy on 03/07/2021.  History of Present Illness:   Patient has a complex comorbid history including systolic CHF chronically, pulmonary hypertension, obstructive sleep apnea, chronic hypoxemia with respiratory failure, obesity hypoventilation syndrome with thoracic restriction, hormonal dysfunction with hypotestosteronism, dyslipidemia, metabolic syndrome, previous subarachnoid hemorrhage, history of traumatic thoracic spinal injury, depression.  Patient was admitted in January to the intensive care unit with altered mental status acute hypoxemic and hypercapnic respiratory failure with metabolic encephalopathy and left lower lobe severe pneumonia.  Throughout hospitalization he was noted to have lethargy with hypercapnic encephalopathy and became unresponsive a few times even postextubation while on medical floor.  Patient has never smoked in the past.   On this admission he is poorly responsive with hypercapnic hypoxemic respiratory failure         Significant Hospital Events: Including procedures, antibiotic start and stop dates in addition to other pertinent events       02/24/21- Patient responded well to mechanical ventilation with improvement in hypercapnia on ABG this am.  Seems that he had THC induced apnea/hypopnea based on laboratory testing.  There is bibasilar atelectasis induced severe hypoxemia, recruitment Metaneb therapy has been ordered. Plan to repeat CXR this afternoon with SBT today.  Reviewed plan with RN and RT today.     02/25/21- patient was unable to pass SBT today. He was able to come off propofol with  RASS-0 and is weaned down on levophed.     02/26/21- patient continues to require levophed, he has been difficult to wean. I have asked ID for consultation due to concern for sepsis possibly due to CAP. Unable to perform SBT today. Family at bedside.   02/27/21- patient on PRVC with heavy secretions, difficulty weaning with high narcotic tolerance. Patient had suicide attempt with requirement for psych.   6/1 failing to wean from vent, DNR status 6/2 failed to wean from vent, severe hypoxia 6/3 severe hypoxia, failure to wean from vent, needs [facility], ENT consulted 6/4 failure to wean from  6/6 failure to wean from vent, plan for [facility] 6/7 failure to wean from vent 6/8 plan for [facility] TODAY 6/9 left foot cyanosis, vasc surgery consulted 6/10: Agitation overnight, added oxycodone, valium, seroquel.  SBT in progress.  Working with PT 6/11: Less agitated, however, did not tolerate SBT today   Micro Data:  5/27: SARS-CoV-2 and influenza PCR>> negative 5/27: Blood culture x2>> no growth 5/27: Urine>> no growth 5/27: MRSA PCR>> negative 5/28: Sputum>> normal respiratory flora 5/30: Respiratory viral panel>> negative 5/31: SPUTUM CULTURE >> ACINETOBACTER CALCOACETICUS/BAUMANNII COMPLEX     Antimicrobials:  Azithromycin 5/27>>5/29 Ceftriaxone 5/27>>5/29 Vancomycin 5/29>>5/30 Zosyn 5/29>>6/1 Unasyn 6/1>>  Antibiotics Given (last 72 hours)     Date/Time Action Medication Dose Rate   03/07/21 2316 New Bag/Given   Ampicillin-Sulbactam (UNASYN) 3 g in sodium chloride 0.9 % 100 mL IVPB 3 g 200 mL/hr   03/08/21 0501 New Bag/Given   Ampicillin-Sulbactam (UNASYN) 3 g in sodium chloride 0.9 % 100 mL IVPB 3 g 200 mL/hr   03/08/21 1235 New Bag/Given   Ampicillin-Sulbactam (UNASYN) 3 g in sodium chloride 0.9 % 100 mL IVPB 3 g 200 mL/hr  03/08/21 1849 New Bag/Given   Ampicillin-Sulbactam (UNASYN) 3 g in sodium chloride 0.9 % 100 mL IVPB 3 g 200 mL/hr   03/08/21 2352 New Bag/Given    Ampicillin-Sulbactam (UNASYN) 3 g in sodium chloride 0.9 % 100 mL IVPB 3 g 200 mL/hr   03/09/21 0556 New Bag/Given   Ampicillin-Sulbactam (UNASYN) 3 g in sodium chloride 0.9 % 100 mL IVPB 3 g 200 mL/hr   03/09/21 1138 New Bag/Given   Ampicillin-Sulbactam (UNASYN) 3 g in sodium chloride 0.9 % 100 mL IVPB 3 g 200 mL/hr        Interim History / Subjective:  -Agitation overnight~ added scheduled oxycodone, valium, seroquel.   -SBT in progress  -Working with PT -Pt now hypertensive, d/c midodrine -Serum Na+ remains elevated @ 153, increased free water flushes    Objective   Blood pressure 131/89, pulse 78, temperature 99.3 F (37.4 C), temperature source Axillary, resp. rate 17, height 5\' 10"  (1.778 m), weight (!) 148.9 kg, SpO2 92 %.    Vent Mode: PRVC FiO2 (%):  [35 %-40 %] 35 % Set Rate:  [15 bmp] 15 bmp Vt Set:  [550 mL] 550 mL PEEP:  [5 cmH20] 5 cmH20   Intake/Output Summary (Last 24 hours) at 03/10/2021 2025 Last data filed at 03/10/2021 1900 Gross per 24 hour  Intake 3555.91 ml  Output 2375 ml  Net 1180.91 ml    Filed Weights   03/08/21 0500 03/09/21 0427 03/10/21 0500  Weight: (!) 156 kg (!) 154.4 kg (!) 148.9 kg      REVIEW OF SYSTEMS PATIENT IS UNABLE TO PROVIDE COMPLETE REVIEW OF SYSTEMS DUE TO SEVERE CRITICAL ILLNESS AND TOXIC METABOLIC ENCEPHALOPATHY   PHYSICAL EXAMINATION:  GENERAL: Acute on chronically ill-appearing male, laying in bed, undergoing spontaneous breathing trial, no acute distress NECK: Supple.  No, JVD, tracheostomy midline PULMONARY: Mechanical breath sounds, mild tachypnea, even CARDIOVASCULAR: S1 and S2. Regular rate and rhythm. No murmurs, rubs, or gallops.  GASTROINTESTINAL: Massively obese, soft, nontender, nondistended, no guarding or rebound tenderness, bowel sounds positive x4 MUSCULOSKELETAL: Previously reported cyanosis of the lower extremities is currently resolved, normal bulk and tone, no deformities NEUROLOGIC: Lightly  sedated, arouses to voice, will intermittently move all extremities to command, no focal deficits SKIN: Warm and dry.  No obvious rashes, lesions, ulcerations     Assessment / Plan:     Acute on Chronic Hypoxic and Hypercapnic Respiratory Failure in the setting of [place] and AECOPD -Unable to wean from vent ~ S/P tracheostomy on 03/07/21 -Full vent support, implement lung protective strategies -Wean FiO2 and PEEP as tolerated to maintain O2 sats 88 to 92% -Follow intermittent chest x-ray and ABG as needed -Spontaneous breathing trial/trach collar trials as tolerated, did not tolerate today -Bronchodilators and budesonide nebs -Continue Unasyn  ACINETOBACTER CALCOACETICUS/BAUMANNII COMPLEX PNEUMONIA -Monitor fever curve -Trend WBCs -Follow cultures as above -To complete course of Unasyn  Septic Shock>>resolved PMHx of Hypertension -Continuous cardiac monitoring -Maintain MAP greater than 65 -Vasopressors weaned off -Patient now hypertensive, discontinued midodrine  Acute toxic metabolic encephalopathy, need for sedation -Maintain RASS goal of 0 to -1 -Dilaudid and Precedex drips as needed -Avoid sedating medications as able -Add scheduled oxycodone, Valium, Seroquel -Provide supportive care, encourage family presence -Promote normal sleep/wake cycle -PT/OT consults    Best practice (right click and "Reselect all SmartList Selections" daily)  Diet:  Tube Feed  Pain/Anxiety/Delirium protocol (if indicated): Yes (RASS goal 0 to -1 ) VAP protocol (if indicated): Yes DVT prophylaxis:  Subcutaneous Heparin GI prophylaxis: PPI Glucose control:  SSI Yes Central venous access:  Yes, and it is still needed Arterial line:  N/A Foley:  N/A Mobility:  bed rest  PT consulted: yes Code Status:  DNR Disposition: ICU    Labs   CBC: Recent Labs  Lab 03/04/21 0431 03/05/21 0415 03/06/21 0340 03/09/21 0423 03/10/21 0420  WBC 8.6 8.8 8.0 10.3 8.0   NEUTROABS 6.9  --   --   --   --   HGB 10.7* 10.6* 10.0* 12.5* 11.4*  HCT 34.5* 34.4* 32.8* 39.8 37.5*  MCV 105.2* 105.8* 107.2* 103.6* 106.5*  PLT 149* 150 140* 176 152     Basic Metabolic Panel: Recent Labs  Lab 03/06/21 0340 03/07/21 0516 03/08/21 0506 03/09/21 0423 03/10/21 0420  NA 145 151* 153* 153* 152*  K 3.9 4.4 4.2 3.8 3.5  CL 107 106 110 106 112*  CO2 34* 36* 34* 36* 34*  GLUCOSE 105* 100* 123* 155* 131*  BUN 60* 59* 53* 47* 33*  CREATININE 1.24 1.22 1.38* 1.24 0.99  CALCIUM 8.3* 9.4 9.4 9.7 8.3*  MG 2.5* 2.6* 2.6* 2.3 1.8  PHOS 3.8 3.9 4.2 3.9 3.7    GFR: Estimated Creatinine Clearance: 114.6 mL/min (by C-G formula based on SCr of 0.99 mg/dL). Recent Labs  Lab 03/05/21 0415 03/06/21 0340 03/09/21 0423 03/10/21 0420  WBC 8.8 8.0 10.3 8.0     Liver Function Tests: No results for input(s): AST, ALT, ALKPHOS, BILITOT, PROT, ALBUMIN in the last 168 hours. No results for input(s): LIPASE, AMYLASE in the last 168 hours. No results for input(s): AMMONIA in the last 168 hours.  ABG    Component Value Date/Time   PHART 7.40 03/02/2021 2000   PCO2ART 69 (HH) 03/02/2021 2000   PO2ART 137 (H) 03/02/2021 2000   HCO3 42.7 (H) 03/02/2021 2000   O2SAT 99.1 03/02/2021 2000      Coagulation Profile: No results for input(s): INR, PROTIME in the last 168 hours.  Cardiac Enzymes: No results for input(s): CKTOTAL, CKMB, CKMBINDEX, TROPONINI in the last 168 hours.  HbA1C: Hgb A1c MFr Bld  Date/Time Value Ref Range Status  03/02/2021 04:52 AM 5.9 (H) 4.8 - 5.6 % Final    Comment:    (NOTE)         Prediabetes: 5.7 - 6.4         Diabetes: >6.4         Glycemic control for adults with diabetes: <7.0     CBG: Recent Labs  Lab 03/10/21 0327 03/10/21 0740 03/10/21 1202 03/10/21 1607 03/10/21 1928  GLUCAP 111* 132* 110* 141* 109*     Allergies Allergies  Allergen Reactions   Divalproex Sodium Other (See Comments)    Elevated liver enzymes    Valproic Acid     unknown     The patient is critically ill with multiple organ systems failure and requires high complexity decision making for assessment and support, frequent evaluation and titration of therapies, application of advanced monitoring technologies and extensive interpretation of multiple databases. Critical Care Time devoted to patient care services described in this note is 40 minutes.   Significant other apprised at bedside   C. Derrill Kay, MD [facility]   *This note was dictated using voice recognition software/Dragon.  Despite best efforts to proofread, errors can occur which can change the meaning.  Any change was purely unintentional.

## 2021-03-11 DIAGNOSIS — J9621 Acute and chronic respiratory failure with hypoxia: Secondary | ICD-10-CM

## 2021-03-11 LAB — GLUCOSE, CAPILLARY
Glucose-Capillary: 109 mg/dL — ABNORMAL HIGH (ref 70–99)
Glucose-Capillary: 110 mg/dL — ABNORMAL HIGH (ref 70–99)
Glucose-Capillary: 119 mg/dL — ABNORMAL HIGH (ref 70–99)
Glucose-Capillary: 127 mg/dL — ABNORMAL HIGH (ref 70–99)
Glucose-Capillary: 131 mg/dL — ABNORMAL HIGH (ref 70–99)
Glucose-Capillary: 147 mg/dL — ABNORMAL HIGH (ref 70–99)

## 2021-03-11 LAB — CBC
HCT: 36.9 % — ABNORMAL LOW (ref 39.0–52.0)
Hemoglobin: 11.6 g/dL — ABNORMAL LOW (ref 13.0–17.0)
MCH: 33 pg (ref 26.0–34.0)
MCHC: 31.4 g/dL (ref 30.0–36.0)
MCV: 105.1 fL — ABNORMAL HIGH (ref 80.0–100.0)
Platelets: 153 10*3/uL (ref 150–400)
RBC: 3.51 MIL/uL — ABNORMAL LOW (ref 4.22–5.81)
RDW: 12.8 % (ref 11.5–15.5)
WBC: 8.9 10*3/uL (ref 4.0–10.5)
nRBC: 0 % (ref 0.0–0.2)

## 2021-03-11 LAB — BASIC METABOLIC PANEL
Anion gap: 6 (ref 5–15)
BUN: 38 mg/dL — ABNORMAL HIGH (ref 8–23)
CO2: 37 mmol/L — ABNORMAL HIGH (ref 22–32)
Calcium: 9.1 mg/dL (ref 8.9–10.3)
Chloride: 109 mmol/L (ref 98–111)
Creatinine, Ser: 1.15 mg/dL (ref 0.61–1.24)
GFR, Estimated: >60 mL/min (ref >60)
Glucose, Bld: 154 mg/dL — ABNORMAL HIGH (ref 70–99)
Potassium: 3.9 mmol/L (ref 3.5–5.1)
Sodium: 152 mmol/L — ABNORMAL HIGH (ref 135–145)

## 2021-03-11 LAB — PHOSPHORUS: Phosphorus: 4.1 mg/dL (ref 2.5–4.6)

## 2021-03-11 LAB — MAGNESIUM: Magnesium: 2.1 mg/dL (ref 1.7–2.4)

## 2021-03-11 MED ORDER — SENNOSIDES-DOCUSATE SODIUM 8.6-50 MG PO TABS
1.0000 | ORAL_TABLET | Freq: Two times a day (BID) | ORAL | Status: DC
  Administered 2021-03-11 – 2021-03-26 (×14): 1
  Filled 2021-03-11 (×14): qty 1

## 2021-03-11 MED ORDER — POLYVINYL ALCOHOL 1.4 % OP SOLN
1.0000 [drp] | OPHTHALMIC | Status: DC | PRN
  Administered 2021-03-11 – 2021-03-26 (×7): 1 [drp] via OPHTHALMIC
  Filled 2021-03-11: qty 15

## 2021-03-11 MED ORDER — CLONAZEPAM 0.5 MG PO TBDP
1.0000 mg | ORAL_TABLET | Freq: Two times a day (BID) | ORAL | Status: DC
  Administered 2021-03-11 – 2021-03-13 (×5): 1 mg
  Filled 2021-03-11 (×5): qty 2

## 2021-03-11 MED ORDER — POLYETHYLENE GLYCOL 3350 17 G PO PACK
17.0000 g | PACK | Freq: Every day | ORAL | Status: DC
  Administered 2021-03-11 – 2021-03-26 (×10): 17 g
  Filled 2021-03-11 (×10): qty 1

## 2021-03-11 MED ORDER — FUROSEMIDE 20 MG PO TABS
20.0000 mg | ORAL_TABLET | Freq: Every day | ORAL | Status: DC
  Administered 2021-03-11 – 2021-03-12 (×2): 20 mg
  Filled 2021-03-11 (×2): qty 1

## 2021-03-11 MED ORDER — CLONAZEPAM 0.5 MG PO TBDP: 1.0000 mg | ORAL_TABLET | Freq: Two times a day (BID) | ORAL | Status: DC

## 2021-03-11 MED ORDER — HYDRALAZINE HCL 20 MG/ML IJ SOLN
10.0000 mg | INTRAMUSCULAR | Status: DC | PRN
  Administered 2021-03-12 – 2021-03-18 (×5): 20 mg via INTRAVENOUS
  Filled 2021-03-11 (×5): qty 1

## 2021-03-11 NOTE — Evaluation (Signed)
Physical Therapy Evaluation Patient Details Name: Eddie Chapman MRN: [id number] DOB: 05-Feb-1961 Today's Date: 03/11/2021   History of Present Illness  Eddie Chapman is a 60 y.o. with a complex medical history including systolic CHF chronically, pulmonary hypertension, obstructive sleep apnea, chronic hypoxemia with respiratory failure, obesity hypoventilation syndrome with thoracic restriction, hormonal dysfunction with hypotestosteronism, dyslipidemia, metabolic syndrome, previous subarachnoid hemorrhage, history of traumatic thoracic spinal injury, & depression. He presented to [facility] ED on 02/23/21 with c/o of confusion & SOB for at least 3 days. Pt was admitted to CCU for ARF. he failed extubation and had trach placed on 03/07/21.  Clinical Impression  Pt seen for PT evaluation with another PT & PTA present to assist during session 2/2 pt requiring +3 assist for mobility at this time. Pt's girlfriend [name]) present to observe. Prior to admission pt was mod I with SPC in community & rollator in home, with pt able to ambulate around small store, but hasn't driven in awhile. Currently, pt requires +3 assist for supine<>sit but does tolerate sitting EOB ~10 minutes with BUE support. Pt engaged in [place] for strengthening but does demonstrate decreased abdominal/trunk strength & decreased ability to shift trunk anteriorly for more upright sitting during activity. Pt is currently unsafe to d/c home as he requires extensive assistance for basic functional mobility. Currently recommending CIR level of therapy upon d/c as pt appears motivated to participate & would benefit from intensive therapies to help facilitate return to PLOF.   Pt on ventilator via trach during session. Lowest SpO2 83% while sitting EOB. Prior to initiating sit>supine tubing disconnected & SpO2 dropped to 73% with nurse quickly entering room & tubing reconnected.     Follow Up Recommendations CIR    Equipment Recommendations   (TBD in  next venue)    Recommendations for Other Services       Precautions / Restrictions Precautions Precautions: Fall Precaution Comments: safety mitts, ventilated with trach on eval Restrictions Weight Bearing Restrictions: No      Mobility  Bed Mobility Overal bed mobility: Needs Assistance Bed Mobility: Supine to Sit;Sit to Supine     Supine to sit: +2 for physical assistance;HOB elevated;+2 for safety/equipment (+3) Sit to supine: +2 for physical assistance;HOB elevated;+2 for safety/equipment (+3)   General bed mobility comments: +3 for supine<>sit    Transfers                    Ambulation/Gait                Stairs            Wheelchair Mobility    Modified Rankin (Stroke Patients Only)       Balance Overall balance assessment: Needs assistance Sitting-balance support: Bilateral upper extremity supported;Feet supported Sitting balance-Leahy Scale: Poor Sitting balance - Comments: min assist static sitting, pt leans back on BUE, decreased abdominal strength to anterior weight shift                                     Pertinent Vitals/Pain Pain Assessment: No/denies pain Faces Pain Scale: No hurt    Home Living Family/patient expects to be discharged to:: Private residence Living Arrangements: Spouse/significant other (girlfriend) Available Help at Discharge: Family;Available 24 hours/day Type of Home: House Home Access: Stairs to enter Entrance Stairs-Rails: None Entrance Stairs-Number of Steps: 2 Home Layout: One level Home Equipment: Cane - single  point (rollator)      Prior Function Level of Independence: Needs assistance   Gait / Transfers Assistance Needed: rollator for gait in home PRN, SPC for community use PRN, pt is able to ambulate around small store (ex: [employer]) but not larger store (ex: [employer]) 2/2 chronic heart issues           Hand Dominance   Dominant Hand: Right    Extremity/Trunk  Assessment   Upper Extremity Assessment Upper Extremity Assessment: Generalized weakness;Difficult to assess due to impaired cognition    Lower Extremity Assessment Lower Extremity Assessment: Difficult to assess due to impaired cognition;Generalized weakness (3-/5 BLE knee extension in sitting EOB)       Communication   Communication: Tracheostomy (pt attempts to verbalize during session, able to understand pt <25% of the time, encouraged pt to use thumbs up/down or head nods/shakes)  Cognition   Behavior During Therapy: Flat affect Overall Cognitive Status: Difficult to assess Area of Impairment: Following commands;Safety/judgement;Memory;Awareness;Problem solving                     Memory: Decreased short-term memory Following Commands: Follows one step commands inconsistently Safety/Judgement: Decreased awareness of safety;Decreased awareness of deficits Awareness: Anticipatory;Emergent Problem Solving: Difficulty sequencing;Decreased initiation;Slow processing;Requires tactile cues;Requires verbal cues General Comments: Cognitive assessment limited by trach impairing communication      General Comments      Exercises General Exercises - Lower Extremity Long Arc Quad: AROM;Strengthening;Both;10 reps;Seated   Assessment/Plan    PT Assessment Patient needs continued PT services  PT Problem List Decreased strength;Decreased balance;Decreased knowledge of precautions;Obesity;Cardiopulmonary status limiting activity;Decreased knowledge of use of DME;Decreased mobility;Decreased activity tolerance;Decreased coordination;Decreased range of motion;Decreased safety awareness       PT Treatment Interventions DME instruction;Functional mobility training;Balance training;Patient/family education;Modalities;Gait training;Therapeutic activities;Neuromuscular re-education;Radiographer, therapeutic;Therapeutic exercise;Cognitive remediation;Manual techniques     PT Goals (Current goals can be found in the Care Plan section)  Acute Rehab PT Goals Patient Stated Goal: get better PT Goal Formulation: With family Time For Goal Achievement: 03/25/21 Potential to Achieve Goals: Fair    Frequency 7X/week   Barriers to discharge Decreased caregiver support;Inaccessible home environment stairs to enter home, pt currently requires extensive physical assistance    Co-evaluation               AM-PAC PT "6 Clicks" Mobility  Outcome Measure Help needed turning from your back to your side while in a flat bed without using bedrails?: Total Help needed moving from lying on your back to sitting on the side of a flat bed without using bedrails?: Total Help needed moving to and from a bed to a chair (including a wheelchair)?: Total Help needed standing up from a chair using your arms (e.g., wheelchair or bedside chair)?: Total Help needed to walk in hospital room?: Total Help needed climbing 3-5 steps with a railing? : Total 6 Click Score: 6    End of Session Equipment Utilized During Treatment: Oxygen Activity Tolerance: Patient tolerated treatment well Patient left: in bed;with call bell/phone within reach;with family/visitor present (BUE mittens donned) Nurse Communication: Mobility status (O2) PT Visit Diagnosis: Muscle weakness (generalized) (M62.81);Difficulty in walking, not elsewhere classified (R26.2);Unsteadiness on feet (R26.81)    Time: 2956-2130 PT Time Calculation (min) (ACUTE ONLY): 22 min   Charges:   PT Evaluation $PT Eval High Complexity: 1 High PT Treatments $Therapeutic Activity: 8-22 mins        Lavone Nian, PT, DPT 03/11/21, 12:11 PM  Waunita Schooner 03/11/2021, 12:08 PM

## 2021-03-11 NOTE — Progress Notes (Signed)
NAME:  Eddie Chapman, MRN:  [id number], DOB:  Mar 23, 1961, LOS: 21 ADMISSION DATE:  02/23/2021  Brief Pt Description / Synopsis:  60 yo morbidly obese male with acute on chronic hypoxic and hypercapnic respiratory failure due to [place] and AECOPD, along with severe toxic metabolic encephalopathy.  Failure to wean from vent requiring Tracheostomy on 03/07/2021.  History of Present Illness:   Patient has a complex comorbid history including systolic CHF chronically, pulmonary hypertension, obstructive sleep apnea, chronic hypoxemia with respiratory failure, obesity hypoventilation syndrome with thoracic restriction, hormonal dysfunction with hypotestosteronism, dyslipidemia, metabolic syndrome, previous subarachnoid hemorrhage, history of traumatic thoracic spinal injury, depression.  Patient was admitted in January to the intensive care unit with altered mental status acute hypoxemic and hypercapnic respiratory failure with metabolic encephalopathy and left lower lobe severe pneumonia.  Throughout hospitalization he was noted to have lethargy with hypercapnic encephalopathy and became unresponsive a few times even postextubation while on medical floor.  Patient has never smoked in the past.   On this admission he is poorly responsive with hypercapnic hypoxemic respiratory failure         Significant Hospital Events: Including procedures, antibiotic start and stop dates in addition to other pertinent events       02/24/21- Patient responded well to mechanical ventilation with improvement in hypercapnia on ABG this am.  Seems that he had THC induced apnea/hypopnea based on laboratory testing.  There is bibasilar atelectasis induced severe hypoxemia, recruitment Metaneb therapy has been ordered. Plan to repeat CXR this afternoon with SBT today.  Reviewed plan with RN and RT today.     02/25/21- patient was unable to pass SBT today. He was able to come off propofol with  RASS-0 and is weaned down on levophed.     02/26/21- patient continues to require levophed, he has been difficult to wean. I have asked ID for consultation due to concern for sepsis possibly due to CAP. Unable to perform SBT today. Family at bedside.   02/27/21- patient on PRVC with heavy secretions, difficulty weaning with high narcotic tolerance. Patient had suicide attempt with requirement for psych.   6/1 failing to wean from vent, DNR status 6/2 failed to wean from vent, severe hypoxia 6/3 severe hypoxia, failure to wean from vent, needs [facility], ENT consulted 6/4 failure to wean from  6/6 failure to wean from vent, [facility] 6/7 failure to wean from vent 6/8 plan for [facility] TODAY 6/9 left foot cyanosis, vasc surgery consulted 6/10: Agitation overnight, added oxycodone, valium, seroquel.  SBT in progress.  Working with PT 6/11: Less agitated, however, did not tolerate SBT today 6/12: Dilaudid being weaned off, less agitated.  Can actually speak with trach.  Tolerating SBT   Micro Data:  5/27: SARS-CoV-2 and influenza PCR>> negative 5/27: Blood culture x2>> no growth 5/27: Urine>> no growth 5/27: MRSA PCR>> negative 5/28: Sputum>> normal respiratory flora 5/30: Respiratory viral panel>> negative 5/31: SPUTUM CULTURE >> ACINETOBACTER CALCOACETICUS/BAUMANNII COMPLEX     Antimicrobials:  Azithromycin 5/27>>5/29 Ceftriaxone 5/27>>5/29 Vancomycin 5/29>>5/30 Zosyn 5/29>>6/1 Unasyn 6/1>>  Antibiotics Given (last 72 hours)     Date/Time Action Medication Dose Rate   03/08/21 1849 New Bag/Given   Ampicillin-Sulbactam (UNASYN) 3 g in sodium chloride 0.9 % 100 mL IVPB 3 g 200 mL/hr   03/08/21 2352 New Bag/Given   Ampicillin-Sulbactam (UNASYN) 3 g in sodium chloride 0.9 % 100 mL IVPB 3 g 200 mL/hr   03/09/21 0556 New Bag/Given   Ampicillin-Sulbactam (UNASYN)  3 g in sodium chloride 0.9 % 100 mL IVPB 3 g 200 mL/hr   03/09/21 1138 New Bag/Given   Ampicillin-Sulbactam (UNASYN) 3 g  in sodium chloride 0.9 % 100 mL IVPB 3 g 200 mL/hr      Scheduled Meds:  budesonide (PULMICORT) nebulizer solution  0.25 mg Nebulization BID   chlorhexidine gluconate (MEDLINE KIT)  15 mL Mouth Rinse BID   Chlorhexidine Gluconate Cloth  6 each Topical Daily   diazepam  5 mg Per Tube Q6H   enoxaparin (LOVENOX) injection  0.5 mg/kg Subcutaneous Q24H   feeding supplement (PROSource TF)  90 mL Per Tube QID   free water  250 mL Per Tube Q2H   insulin aspart  0-15 Units Subcutaneous Q4H   ipratropium-albuterol  3 mL Nebulization Q6H   mouth rinse  15 mL Mouth Rinse 10 times per day   oxyCODONE  10 mg Per Tube Q6H   pantoprazole sodium  40 mg Per Tube Daily   polyethylene glycol  17 g Per Tube Daily   QUEtiapine  25 mg Per Tube QHS   senna-docusate  1 tablet Per Tube BID   sodium chloride flush  10-40 mL Intracatheter Q12H   Continuous Infusions:  sodium chloride 5 mL/hr at 03/07/21 0724   dexmedetomidine (PRECEDEX) IV infusion 1.2 mcg/kg/hr (03/11/21 1800)   feeding supplement (VITAL 1.5 CAL) 1,000 mL (03/11/21 0234)   HYDROmorphone 3 mg/hr (03/11/21 1251)   PRN Meds:.bisacodyl, docusate sodium, ipratropium-albuterol, midazolam, polyethylene glycol, polyvinyl alcohol, sodium chloride flush   Interim History / Subjective:  -Agitation overnight~ added scheduled oxycodone, valium, seroquel.   -SBT in progress  -Working with PT -Pt now hypertensive, d/c midodrine -Serum Na+ remains elevated @ 152, increased free water flushes  -Able to speak with trach in place.  Monosyllables only.  Not fully oriented.   Objective   Blood pressure 136/81, pulse 79, temperature (!) 100.4 F (38 C), temperature source Oral, resp. rate 18, height '5\' 10"'  (1.778 m), weight (!) 146.7 kg, SpO2 91 %.    Vent Mode: PRVC FiO2 (%):  [30 %-35 %] 35 % Set Rate:  [15 bmp] 15 bmp Vt Set:  [550 mL] 550 mL PEEP:  [5 cmH20] 5 cmH20   Intake/Output Summary (Last 24 hours) at 03/11/2021 1838 Last data filed at  03/11/2021 1400 Gross per 24 hour  Intake 7631.83 ml  Output 2000 ml  Net 5631.83 ml    Filed Weights   03/09/21 0427 03/10/21 0500 03/11/21 0500  Weight: (!) 154.4 kg (!) 148.9 kg (!) 146.7 kg      REVIEW OF SYSTEMS PATIENT IS UNABLE TO PROVIDE COMPLETE REVIEW OF SYSTEMS DUE TO SEVERE CRITICAL ILLNESS AND TOXIC METABOLIC ENCEPHALOPATHY   PHYSICAL EXAMINATION:  GENERAL: Morbidly obese, chronically ill-appearing male, laying in bed, undergoing spontaneous breathing trial, no acute distress NECK: Supple.  No, JVD, tracheostomy midline PULMONARY: Mechanical breath sounds, no tachypnea, even CARDIOVASCULAR: S1 and S2. Regular rate and rhythm. No murmurs, rubs, or gallops.  GASTROINTESTINAL: Massively obese, soft, nontender, nondistended, no guarding or rebound tenderness, bowel sounds positive x4 MUSCULOSKELETAL: Previously reported cyanosis of the lower extremities is currently resolved, normal bulk and tone, no deformities NEUROLOGIC: Lightly sedated, arouses to voice, will intermittently move all extremities to command, no focal deficits SKIN: Warm and dry.  No obvious rashes, lesions, ulcerations     Assessment / Plan:    Acute on Chronic Hypoxic and Hypercapnic Respiratory Failure in the setting of [place] and AECOPD -Unable  to wean from vent ~ S/P tracheostomy on 03/07/21 -Full vent support, implement lung protective strategies -Wean FiO2 and PEEP as tolerated to maintain O2 sats 88 to 92% -Follow intermittent chest x-ray and ABG as needed -Spontaneous breathing trial/trach collar trials as tolerated, tolerating today -Bronchodilators and budesonide nebs -Completed antibiotics  ACINETOBACTER CALCOACETICUS/BAUMANNII COMPLEX PNEUMONIA -Monitor fever curve -Trend WBCs -Follow cultures as above -To complete course of Unasyn  Septic Shock>>resolved PMHx of Hypertension -Continuous cardiac monitoring -Maintain MAP greater than 65 -Vasopressors  weaned off -Patient now hypertensive, discontinued midodrine  Acute toxic metabolic encephalopathy, need for sedation -Maintain RASS goal of 0 to -1 -Dilaudid and Precedex drips as needed -Weaning off Dilaudid -Continue to wean off sedatives -Avoid sedating medications as able -Add scheduled oxycodone, Seroquel -Discontinued Valium, Klonopin 1 mg twice daily -Provide supportive care, encourage family presence -Promote normal sleep/wake cycle -PT/OT consults    Best practice (right click and "Reselect all SmartList Selections" daily)  Diet:  Tube Feed  Pain/Anxiety/Delirium protocol (if indicated): Yes (RASS goal 0 to -1 ) VAP protocol (if indicated): Yes DVT prophylaxis: Subcutaneous Heparin GI prophylaxis: PPI Glucose control:  SSI Yes Central venous access:  Yes, and it is still needed Arterial line:  N/A Foley:  N/A Mobility:  bed rest  PT consulted: yes Code Status:  DNR Disposition: ICU    Labs   CBC: Recent Labs  Lab 03/05/21 0415 03/06/21 0340 03/09/21 0423 03/10/21 0420 03/11/21 0549  WBC 8.8 8.0 10.3 8.0 8.9  HGB 10.6* 10.0* 12.5* 11.4* 11.6*  HCT 34.4* 32.8* 39.8 37.5* 36.9*  MCV 105.8* 107.2* 103.6* 106.5* 105.1*  PLT 150 140* 176 152 153     Basic Metabolic Panel: Recent Labs  Lab 03/07/21 0516 03/08/21 0506 03/09/21 0423 03/10/21 0420 03/11/21 0549  NA 151* 153* 153* 152* 152*  K 4.4 4.2 3.8 3.5 3.9  CL 106 110 106 112* 109  CO2 36* 34* 36* 34* 37*  GLUCOSE 100* 123* 155* 131* 154*  BUN 59* 53* 47* 33* 38*  CREATININE 1.22 1.38* 1.24 0.99 1.15  CALCIUM 9.4 9.4 9.7 8.3* 9.1  MG 2.6* 2.6* 2.3 1.8 2.1  PHOS 3.9 4.2 3.9 3.7 4.1    GFR: Estimated Creatinine Clearance: 97.8 mL/min (by C-G formula based on SCr of 1.15 mg/dL). Recent Labs  Lab 03/06/21 0340 03/09/21 0423 03/10/21 0420 03/11/21 0549  WBC 8.0 10.3 8.0 8.9     Liver Function Tests: No results for input(s): AST, ALT, ALKPHOS, BILITOT, PROT, ALBUMIN in the last 168  hours. No results for input(s): LIPASE, AMYLASE in the last 168 hours. No results for input(s): AMMONIA in the last 168 hours.  ABG    Component Value Date/Time   PHART 7.40 03/02/2021 2000   PCO2ART 69 (HH) 03/02/2021 2000   PO2ART 137 (H) 03/02/2021 2000   HCO3 42.7 (H) 03/02/2021 2000   O2SAT 99.1 03/02/2021 2000      Coagulation Profile: No results for input(s): INR, PROTIME in the last 168 hours.  Cardiac Enzymes: No results for input(s): CKTOTAL, CKMB, CKMBINDEX, TROPONINI in the last 168 hours.  HbA1C: Hgb A1c MFr Bld  Date/Time Value Ref Range Status  03/02/2021 04:52 AM 5.9 (H) 4.8 - 5.6 % Final    Comment:    (NOTE)         Prediabetes: 5.7 - 6.4         Diabetes: >6.4         Glycemic control for adults with diabetes: <7.0  CBG: Recent Labs  Lab 03/10/21 2330 03/11/21 0343 03/11/21 0725 03/11/21 1122 03/11/21 1529  GLUCAP 124* 110* 147* 109* 131*     Allergies Allergies  Allergen Reactions   Divalproex Sodium Other (See Comments)    Elevated liver enzymes   Valproic Acid     unknown     The patient is critically ill with multiple organ systems failure and requires high complexity decision making for assessment and support, frequent evaluation and titration of therapies, application of advanced monitoring technologies and extensive interpretation of multiple databases. Critical Care Time devoted to patient care services described in this note is 35 minutes.   Significant other apprised at bedside   C. Derrill Kay, MD [place] [facility]   *This note was dictated using voice recognition software/Dragon.  Despite best efforts to proofread, errors can occur which can change the meaning.  Any change was purely unintentional.

## 2021-03-11 NOTE — TOC Progression Note (Addendum)
Transition of Care [facility]) - Progression Note    Patient Details  Name: Eddie Chapman MRN: [id number] Date of Birth: [birth year]  Transition of Care [facility]) CM/SW [place], [place] Phone Number: [phone] 03/11/2021, 12:20 PM  Clinical Narrative:     PT has recommended CIR for patient.  Patient's SO and main contact Baum,[name] (Significant other) [phone] (Mobile) prefers Inpatient Rehab at [facility].  CSW will contact [place] with referral request.        Expected Discharge Plan and Services                                                 Social Determinants of Health (SDOH) Interventions    Readmission Risk Interventions No flowsheet data found.

## 2021-03-11 NOTE — Progress Notes (Signed)
Inpatient Rehab Admissions Coordinator Note:   Per therapy recommendations, pt was screened for CIR candidacy by Clemens Catholic, [place] CCC-SLP. Note that Pt.'s partner is interested in [facility] IRF program and that [facility] has sent referral. Pt. Is also requiring continued vent support, and Pt would need to be fully weaned from vent before CIR could accept him. [facility] team will follow at a distance and begin work up if Pt becomes an appropriate candidate and if [facility] is unable to accept Pt.   Clemens Catholic, [place], [place] Admissions Coordinator  [phone] ([place]) [phone] (office)

## 2021-03-11 NOTE — TOC Progression Note (Signed)
Transition of Care [facility]) - Progression Note    Patient Details  Name: Eddie Chapman MRN: [id number] Date of Birth: [birth year]  Transition of Care [facility]) CM/SW [place], [place] Phone Number: [phone] 03/11/2021, 9:07 AM  Clinical Narrative:     CSW contacted Dr. Patsey Berthold who stated she was unable to do the peer-to-peer.  CSW updated [facility] rep.       Expected Discharge Plan and Services                                                 Social Determinants of Health (SDOH) Interventions    Readmission Risk Interventions No flowsheet data found.

## 2021-03-12 ENCOUNTER — Inpatient Hospital Stay: Payer: PPO

## 2021-03-12 ENCOUNTER — Encounter: Payer: Self-pay | Admitting: Pulmonary Disease

## 2021-03-12 DIAGNOSIS — Z515 Encounter for palliative care: Secondary | ICD-10-CM

## 2021-03-12 DIAGNOSIS — I5022 Chronic systolic (congestive) heart failure: Secondary | ICD-10-CM

## 2021-03-12 DIAGNOSIS — J9601 Acute respiratory failure with hypoxia: Secondary | ICD-10-CM

## 2021-03-12 LAB — GLUCOSE, CAPILLARY
Glucose-Capillary: 104 mg/dL — ABNORMAL HIGH (ref 70–99)
Glucose-Capillary: 110 mg/dL — ABNORMAL HIGH (ref 70–99)
Glucose-Capillary: 121 mg/dL — ABNORMAL HIGH (ref 70–99)
Glucose-Capillary: 146 mg/dL — ABNORMAL HIGH (ref 70–99)
Glucose-Capillary: 162 mg/dL — ABNORMAL HIGH (ref 70–99)
Glucose-Capillary: 87 mg/dL (ref 70–99)

## 2021-03-12 LAB — BASIC METABOLIC PANEL
Anion gap: 5 (ref 5–15)
BUN: 34 mg/dL — ABNORMAL HIGH (ref 8–23)
CO2: 34 mmol/L — ABNORMAL HIGH (ref 22–32)
Calcium: 8.1 mg/dL — ABNORMAL LOW (ref 8.9–10.3)
Chloride: 112 mmol/L — ABNORMAL HIGH (ref 98–111)
Creatinine, Ser: 0.93 mg/dL (ref 0.61–1.24)
GFR, Estimated: >60 mL/min (ref >60)
Glucose, Bld: 101 mg/dL — ABNORMAL HIGH (ref 70–99)
Potassium: 3 mmol/L — ABNORMAL LOW (ref 3.5–5.1)
Sodium: 151 mmol/L — ABNORMAL HIGH (ref 135–145)

## 2021-03-12 LAB — CBC
HCT: 33.4 % — ABNORMAL LOW (ref 39.0–52.0)
Hemoglobin: 10.8 g/dL — ABNORMAL LOW (ref 13.0–17.0)
MCH: 33.1 pg (ref 26.0–34.0)
MCHC: 32.3 g/dL (ref 30.0–36.0)
MCV: 102.5 fL — ABNORMAL HIGH (ref 80.0–100.0)
Platelets: 130 10*3/uL — ABNORMAL LOW (ref 150–400)
RBC: 3.26 MIL/uL — ABNORMAL LOW (ref 4.22–5.81)
RDW: 12.5 % (ref 11.5–15.5)
WBC: 8 10*3/uL (ref 4.0–10.5)
nRBC: 0 % (ref 0.0–0.2)

## 2021-03-12 LAB — PHOSPHORUS: Phosphorus: 2.5 mg/dL (ref 2.5–4.6)

## 2021-03-12 LAB — MAGNESIUM: Magnesium: 1.7 mg/dL (ref 1.7–2.4)

## 2021-03-12 MED ORDER — STERILE WATER FOR INJECTION IJ SOLN
INTRAMUSCULAR | Status: AC
  Filled 2021-03-12: qty 10

## 2021-03-12 MED ORDER — MAGNESIUM SULFATE 2 GM/50ML IV SOLN
2.0000 g | Freq: Once | INTRAVENOUS | Status: AC
  Administered 2021-03-12: 2 g via INTRAVENOUS
  Filled 2021-03-12: qty 50

## 2021-03-12 MED ORDER — FUROSEMIDE 10 MG/ML IJ SOLN
60.0000 mg | Freq: Two times a day (BID) | INTRAMUSCULAR | Status: DC
  Administered 2021-03-12 – 2021-03-13 (×3): 60 mg via INTRAVENOUS
  Filled 2021-03-12 (×3): qty 6

## 2021-03-12 MED ORDER — PANTOPRAZOLE SODIUM 40 MG IV SOLR
40.0000 mg | INTRAVENOUS | Status: DC
  Administered 2021-03-13: 40 mg via INTRAVENOUS
  Filled 2021-03-12: qty 40

## 2021-03-12 MED ORDER — HYDROMORPHONE HCL 1 MG/ML IJ SOLN
INTRAMUSCULAR | Status: AC
  Administered 2021-03-12: 0.5 mg via INTRAVENOUS
  Filled 2021-03-12: qty 1

## 2021-03-12 MED ORDER — POTASSIUM CHLORIDE 20 MEQ PO PACK
20.0000 meq | PACK | ORAL | Status: DC
  Administered 2021-03-12: 20 meq
  Filled 2021-03-12: qty 1

## 2021-03-12 MED ORDER — QUETIAPINE FUMARATE 25 MG PO TABS
50.0000 mg | ORAL_TABLET | Freq: Every day | ORAL | Status: DC
  Administered 2021-03-12 – 2021-03-13 (×2): 50 mg
  Filled 2021-03-12 (×2): qty 2

## 2021-03-12 MED ORDER — HYDROMORPHONE HCL 1 MG/ML IJ SOLN: 0.5000 mg | Freq: Once | INTRAMUSCULAR | Status: AC

## 2021-03-12 MED ORDER — POTASSIUM CHLORIDE 10 MEQ/50ML IV SOLN
10.0000 meq | INTRAVENOUS | Status: AC
  Administered 2021-03-12 (×4): 10 meq via INTRAVENOUS
  Filled 2021-03-12 (×4): qty 50

## 2021-03-12 MED ORDER — POTASSIUM CHLORIDE 2 MEQ/ML IV SOLN
INTRAVENOUS | Status: AC
  Filled 2021-03-12: qty 500

## 2021-03-12 MED ORDER — POTASSIUM CHLORIDE 2 MEQ/ML IV SOLN
INTRAVENOUS | Status: DC
  Filled 2021-03-12 (×2): qty 500

## 2021-03-12 NOTE — Progress Notes (Signed)
NAME:  Eddie Chapman, MRN:  [id number], DOB:  1961/01/05, LOS: 19 ADMISSION DATE:  02/23/2021  Brief Pt Description / Synopsis:  60 yo morbidly obese male with acute on chronic hypoxic and hypercapnic respiratory failure due to [place] and AECOPD, along with severe toxic metabolic encephalopathy.  Failure to wean from vent requiring Tracheostomy on 03/07/2021.  History of Present Illness:   Patient has a complex comorbid history including systolic CHF chronically, pulmonary hypertension, obstructive sleep apnea, chronic hypoxemia with respiratory failure, obesity hypoventilation syndrome with thoracic restriction, hormonal dysfunction with hypotestosteronism, dyslipidemia, metabolic syndrome, previous subarachnoid hemorrhage, history of traumatic thoracic spinal injury, depression.  Patient was admitted in January to the intensive care unit with altered mental status acute hypoxemic and hypercapnic respiratory failure with metabolic encephalopathy and left lower lobe severe pneumonia.  Throughout hospitalization he was noted to have lethargy with hypercapnic encephalopathy and became unresponsive a few times even postextubation while on medical floor.  Patient has never smoked in the past.   On this admission he is poorly responsive with hypercapnic hypoxemic respiratory failure         Significant Hospital Events: Including procedures, antibiotic start and stop dates in addition to other pertinent events       02/24/21- Patient responded well to mechanical ventilation with improvement in hypercapnia on ABG this am.  Seems that he had THC induced apnea/hypopnea based on laboratory testing.  There is bibasilar atelectasis induced severe hypoxemia, recruitment Metaneb therapy has been ordered. Plan to repeat CXR this afternoon with SBT today.  Reviewed plan with RN and RT today.     02/25/21- patient was unable to pass SBT today. He was able to come off propofol with  RASS-0 and is weaned down on levophed.     02/26/21- patient continues to require levophed, he has been difficult to wean. I have asked ID for consultation due to concern for sepsis possibly due to CAP. Unable to perform SBT today. Family at bedside.   02/27/21- patient on PRVC with heavy secretions, difficulty weaning with high narcotic tolerance. Patient had suicide attempt with requirement for psych.   6/1 failing to wean from vent, DNR status 6/2 failed to wean from vent, severe hypoxia 6/3 severe hypoxia, failure to wean from vent, needs [facility], ENT consulted 6/4 failure to wean from  6/6 failure to wean from vent, plan for [facility] 6/7 failure to wean from vent 6/8 plan for [facility] TODAY 6/9 left foot cyanosis, vasc surgery consulted 6/10: Agitation overnight, added oxycodone, valium, seroquel.  SBT in progress.  Working with PT 6/11: Less agitated, however, did not tolerate SBT today 6/12: Dilaudid being weaned off, less agitated.  Can actually speak with trach.  Tolerating SBT 03/12/21- patient febrile, Trache aspirate sent for viral workup. Patient with anasarca on examination. Adequate UOP.  On PRVC 35%, GCS3T.    Micro Data:  5/27: SARS-CoV-2 and influenza PCR>> negative 5/27: Blood culture x2>> no growth 5/27: Urine>> no growth 5/27: MRSA PCR>> negative 5/28: Sputum>> normal respiratory flora 5/30: Respiratory viral panel>> negative 5/31: SPUTUM CULTURE >> ACINETOBACTER CALCOACETICUS/BAUMANNII COMPLEX     Antimicrobials:  Azithromycin 5/27>>5/29 Ceftriaxone 5/27>>5/29 Vancomycin 5/29>>5/30 Zosyn 5/29>>6/1 Unasyn 6/1>>  Antibiotics Given (last 72 hours)     Date/Time Action Medication Dose Rate   03/09/21 1138 New Bag/Given   Ampicillin-Sulbactam (UNASYN) 3 g in sodium chloride 0.9 % 100 mL IVPB 3 g 200 mL/hr      Scheduled Meds:  budesonide (PULMICORT) nebulizer solution  0.25 mg Nebulization BID   chlorhexidine gluconate (MEDLINE KIT)  15 mL Mouth Rinse BID    Chlorhexidine Gluconate Cloth  6 each Topical Daily   clonazepam  1 mg Per Tube BID   enoxaparin (LOVENOX) injection  0.5 mg/kg Subcutaneous Q24H   feeding supplement (PROSource TF)  90 mL Per Tube QID   free water  250 mL Per Tube Q2H   furosemide  20 mg Per Tube Daily   insulin aspart  0-15 Units Subcutaneous Q4H   ipratropium-albuterol  3 mL Nebulization Q6H   mouth rinse  15 mL Mouth Rinse 10 times per day   oxyCODONE  10 mg Per Tube Q6H   pantoprazole sodium  40 mg Per Tube Daily   polyethylene glycol  17 g Per Tube Daily   potassium chloride  20 mEq Per Tube Q4H   QUEtiapine  25 mg Per Tube QHS   senna-docusate  1 tablet Per Tube BID   sodium chloride flush  10-40 mL Intracatheter Q12H   Continuous Infusions:  sodium chloride Stopped (03/11/21 1855)   dexmedetomidine (PRECEDEX) IV infusion 1.2 mcg/kg/hr (03/12/21 0740)   feeding supplement (VITAL 1.5 CAL) 1,000 mL (03/11/21 2350)   magnesium sulfate bolus IVPB 2 g (03/12/21 0738)   potassium chloride 10 mEq (03/12/21 0739)   PRN Meds:.bisacodyl, docusate sodium, hydrALAZINE, ipratropium-albuterol, midazolam, polyethylene glycol, polyvinyl alcohol, sodium chloride flush    Objective   Blood pressure (!) 142/93, pulse 97, temperature (!) 101.7 F (38.7 C), temperature source Axillary, resp. rate (!) 29, height '5\' 10"'  (1.778 m), weight (!) 146.7 kg, SpO2 95 %.    Vent Mode: PRVC FiO2 (%):  [35 %] 35 % Set Rate:  [15 bmp] 15 bmp Vt Set:  [550 mL] 550 mL PEEP:  [5 cmH20] 5 cmH20 Plateau Pressure:  [20 cmH20] 20 cmH20   Intake/Output Summary (Last 24 hours) at 03/12/2021 0827 Last data filed at 03/12/2021 0800 Gross per 24 hour  Intake 3323.52 ml  Output 4175 ml  Net -851.48 ml    Filed Weights   03/09/21 0427 03/10/21 0500 03/11/21 0500  Weight: (!) 154.4 kg (!) 148.9 kg (!) 146.7 kg      REVIEW OF SYSTEMS PATIENT IS UNABLE TO PROVIDE COMPLETE REVIEW OF SYSTEMS DUE TO SEVERE CRITICAL ILLNESS AND TOXIC METABOLIC  ENCEPHALOPATHY   PHYSICAL EXAMINATION:  GENERAL: Morbidly obese, chronically ill-appearing male, laying in bed, undergoing spontaneous breathing trial, no acute distress NECK: Supple.  No, JVD, tracheostomy midline PULMONARY: Mechanical breath sounds, no tachypnea, even CARDIOVASCULAR: S1 and S2. Regular rate and rhythm. No murmurs, rubs, or gallops.  GASTROINTESTINAL: Massively obese, soft, nontender, nondistended, no guarding or rebound tenderness, bowel sounds positive x4 MUSCULOSKELETAL: 3+ edema NEUROLOGIC: GCS7T  SKIN: Warm and dry.  No obvious rashes, lesions, ulcerations     Assessment / Plan:    Acute on Chronic Hypoxic and Hypercapnic Respiratory Failure in the setting of [place] and AECOPD -Unable to wean from vent ~ S/P tracheostomy on 03/07/21 -Full vent support, implement lung protective strategies -Wean FiO2 and PEEP as tolerated to maintain O2 sats 88 to 92% -Follow intermittent chest x-ray and ABG as needed -Spontaneous breathing trial/trach collar trials as tolerated, tolerating today -Bronchodilators and budesonide nebs -Completed antibiotics             Short term plan for next 24h: >> reduce IV infusion volume.  Reduce sedative/centrally acting RX.  Repeat SBT.  Continue Diuresis.  ACINETOBACTER CALCOACETICUS/BAUMANNII COMPLEX PNEUMONIA -Monitor fever  curve -Trend WBCs -Follow cultures as above -To complete course of Unasyn  Septic Shock>>resolved PMHx of Hypertension -Continuous cardiac monitoring -Maintain MAP greater than 65 -Vasopressors weaned off -Patient now hypertensive, discontinued midodrine  Acute toxic metabolic encephalopathy, need for sedation -Maintain RASS goal of 0 to -1 -Dilaudid and Precedex drips as needed -Weaning off Dilaudid -Continue to wean off sedatives -Avoid sedating medications as able -Add scheduled oxycodone, Seroquel -Discontinued Valium, Klonopin 1 mg twice daily -Provide supportive care,  encourage family presence -Promote normal sleep/wake cycle -PT/OT consults    Best practice (right click and "Reselect all SmartList Selections" daily)  Diet:  Tube Feed  Pain/Anxiety/Delirium protocol (if indicated): Yes (RASS goal 0 to -1 ) VAP protocol (if indicated): Yes DVT prophylaxis: Subcutaneous Heparin GI prophylaxis: PPI Glucose control:  SSI Yes Central venous access:  Yes, and it is still needed Arterial line:  N/A Foley:  N/A Mobility:  bed rest  PT consulted: yes Code Status:  DNR Disposition: ICU    Labs   CBC: Recent Labs  Lab 03/06/21 0340 03/09/21 0423 03/10/21 0420 03/11/21 0549 03/12/21 0539  WBC 8.0 10.3 8.0 8.9 8.0  HGB 10.0* 12.5* 11.4* 11.6* 10.8*  HCT 32.8* 39.8 37.5* 36.9* 33.4*  MCV 107.2* 103.6* 106.5* 105.1* 102.5*  PLT 140* 176 152 153 130*     Basic Metabolic Panel: Recent Labs  Lab 03/08/21 0506 03/09/21 0423 03/10/21 0420 03/11/21 0549 03/12/21 0539  NA 153* 153* 152* 152* 151*  K 4.2 3.8 3.5 3.9 3.0*  CL 110 106 112* 109 112*  CO2 34* 36* 34* 37* 34*  GLUCOSE 123* 155* 131* 154* 101*  BUN 53* 47* 33* 38* 34*  CREATININE 1.38* 1.24 0.99 1.15 0.93  CALCIUM 9.4 9.7 8.3* 9.1 8.1*  MG 2.6* 2.3 1.8 2.1 1.7  PHOS 4.2 3.9 3.7 4.1 2.5    GFR: Estimated Creatinine Clearance: 120.9 mL/min (by C-G formula based on SCr of 0.93 mg/dL). Recent Labs  Lab 03/09/21 0423 03/10/21 0420 03/11/21 0549 03/12/21 0539  WBC 10.3 8.0 8.9 8.0     Liver Function Tests: No results for input(s): AST, ALT, ALKPHOS, BILITOT, PROT, ALBUMIN in the last 168 hours. No results for input(s): LIPASE, AMYLASE in the last 168 hours. No results for input(s): AMMONIA in the last 168 hours.  ABG    Component Value Date/Time   PHART 7.40 03/02/2021 2000   PCO2ART 69 (HH) 03/02/2021 2000   PO2ART 137 (H) 03/02/2021 2000   HCO3 42.7 (H) 03/02/2021 2000   O2SAT 99.1 03/02/2021 2000      Coagulation Profile: No results for input(s): INR,  PROTIME in the last 168 hours.  Cardiac Enzymes: No results for input(s): CKTOTAL, CKMB, CKMBINDEX, TROPONINI in the last 168 hours.  HbA1C: Hgb A1c MFr Bld  Date/Time Value Ref Range Status  03/02/2021 04:52 AM 5.9 (H) 4.8 - 5.6 % Final    Comment:    (NOTE)         Prediabetes: 5.7 - 6.4         Diabetes: >6.4         Glycemic control for adults with diabetes: <7.0     CBG: Recent Labs  Lab 03/11/21 1529 03/11/21 1956 03/11/21 2343 03/12/21 0409 03/12/21 0720  GLUCAP 131* 119* 127* 110* 104*     Allergies Allergies  Allergen Reactions   Divalproex Sodium Other (See Comments)    Elevated liver enzymes   Valproic Acid     unknown  The patient is critically ill with multiple organ systems failure and requires high complexity decision making for assessment and support, frequent evaluation and titration of therapies, application of advanced monitoring technologies and extensive interpretation of multiple databases. Critical Care Time devoted to patient care services described in this note is 35 minutes.      Ottie Glazier, M.D.  [facility]     *This note was dictated using voice recognition software/Dragon.  Despite best efforts to proofread, errors can occur which can change the meaning.  Any change was purely unintentional.

## 2021-03-12 NOTE — Progress Notes (Signed)
Nutrition Follow-up  DOCUMENTATION CODES:   Morbid obesity  INTERVENTION:   Continue Vital 1.5_0 /hr + ProSource TF 75m QID  Free water flushes 2518mq4 hours per MD   Regimen provides 2300kcal/day, 177g/day protein and 25088may free water   NUTRITION DIAGNOSIS:   Increased nutrient needs related to acute illness as evidenced by estimated needs.  GOAL:   Provide needs based on ASPEN/SCCM guidelines -met with tube feeds   MONITOR:   TF tolerance, I & O's, Vent status, Labs, Weight trends  ASSESSMENT:   [age] male with a h/o HTN, HLD, OSA, CHF, MDD, GERD, cerebral aneurysm and pulmonary HTN who is admitted with CAP, COPD exacerbation and encephalopathy.   Pt s/p tracheostomy and NGT 6/8   Pt remains sedated and ventilated. Pt undergoing SBTs. Pt with anasarca and is currently being diuresed. UOP good. Pt +21.7L on his I & O's. Hypernatremia improving. Per chart, pt up ~13lbs from his UBW. Plan is for CIR at discharge.   Medications reviewed and include: lovenox, lasix, insulin, oxycodone, protonix, miralax, senokot, precedex  Labs reviewed: Na 151(H), K 3.0(L), BUN 34(H), P 2.5 wnl, Mg 1.7 wnl Hgb 10.8(L), Hct 33.4(L)  Patient is currently intubated on ventilator support MV: 19.0 L/min Temp (24hrs), Avg:100.3 F (37.9 C), Min:98.7 F (37.1 C), Max:101.8 F (38.8 C)  Propofol: none  MAP- >8m79m UOP- 3875ml33met Order:   Diet Order             Diet NPO time specified  Diet effective now                  EDUCATION NEEDS:   No education needs have been identified at this time  Skin:  Skin Assessment: Reviewed RN Assessment, Incision neck   Last BM:  6/13- type 6  Height:   Ht Readings from Last 1 Encounters:  03/01/21 _1  (1.778 m)    Weight:   Wt Readings from Last 1 Encounters:  03/11/21 (!) 146.7 kg    Ideal Body Weight:  75.5 kg  BMI:  Body mass index is 46.41 kg/m.  Estimated Nutritional Needs:   Kcal:   2000-2300kcal/day  Protein:  151-189 g/d (2-2.5g/kg IBW)  Fluid:  2.3-2.6L/day  CaseyKoleen DistanceRD, LDN Please refer to [facility] Hospital[facility] and/or RD on-call/weekend/after hours pager

## 2021-03-12 NOTE — Progress Notes (Signed)
Occupational Therapy Treatment Patient Details Name: Eddie Chapman MRN: [id number] DOB: Apr 09, 1961 Today's Date: 03/12/2021    History of present illness Eddie Chapman is a 60 y.o. with a complex medical history including systolic CHF chronically, pulmonary hypertension, obstructive sleep apnea, chronic hypoxemia with respiratory failure, obesity hypoventilation syndrome with thoracic restriction, hormonal dysfunction with hypotestosteronism, dyslipidemia, metabolic syndrome, previous subarachnoid hemorrhage, history of traumatic thoracic spinal injury, & depression. He presented to [facility] ED on 02/23/21 with c/o of confusion & SOB for at least 3 days. Pt was admitted to CCU for ARF. he failed extubation and had trach placed on 03/07/21.   OT comments  Pt seen for OT and PT co-tx this date to address ADL and mobility. Pt received in bed, attempts to mouth words but difficult to read lips. Does better with yes/no questions to nod or shake his head in response. Follows simple commands well this date. Pt mitts removed. Pt provided with set up of washcloth to wash face at bed level. Pt required MOD A to complete 2/2 decreased BUE strength and activity tolerance. Pt required +3 assist for sup<>sit EOB, R lateral lean, and occasional cues to maintain midline, requiring support. Pt RR up to 40's with exertion. Pt attempted to scoot forward after indicating discomfort in groin/abdominal area in sitting. Pt progressing towards goals. Discharge recommendations updated.     Follow Up Recommendations  CIR    Equipment Recommendations  Other (comment) (bariatric BSC)    Recommendations for Other Services      Precautions / Restrictions Precautions Precautions: Fall Precaution Comments: safety mitts, ventilated with trach Restrictions Weight Bearing Restrictions: No       Mobility Bed Mobility Overal bed mobility: Needs Assistance Bed Mobility: Supine to Sit;Sit to Supine;Rolling Rolling: Max assist;+2  for physical assistance   Supine to sit: +2 for physical assistance;HOB elevated;+2 for safety/equipment (+3) Sit to supine: +2 for physical assistance;HOB elevated;+2 for safety/equipment (+3)   General bed mobility comments: +3 for supine<>sit    Transfers                      Balance Overall balance assessment: Needs assistance Sitting-balance support: Feet supported Sitting balance-Leahy Scale: Poor Sitting balance - Comments: reliant on BUE support, modA until repositioned to midline, able to decrease to minA Postural control: Right lateral lean                                 ADL either performed or assessed with clinical judgement   ADL Overall ADL's : Needs assistance/impaired     Grooming: Wash/dry face;Oral care;Bed level;Moderate assistance Grooming Details (indicate cue type and reason): Cues to initiate, MOD A to complete 2/2 BUE weakness, poor activity tolerance                                     Vision Patient Visual Report: No change from baseline     Perception     Praxis      Cognition Arousal/Alertness: Awake/alert Behavior During Therapy: Impulsive;WFL for tasks assessed/performed Overall Cognitive Status: Difficult to assess Area of Impairment: Following commands;Safety/judgement;Memory;Awareness;Problem solving                     Memory: Decreased short-term memory Following Commands: Follows one step commands consistently Safety/Judgement: Decreased awareness of safety;Decreased awareness  of deficits Awareness: Anticipatory;Emergent Problem Solving: Difficulty sequencing;Decreased initiation;Slow processing;Requires tactile cues;Requires verbal cues General Comments: Cognitive assessment limited by trach impairing communication        Exercises    Shoulder Instructions       General Comments      Pertinent Vitals/ Pain       Pain Assessment: Faces Faces Pain Scale: Hurts a little  bit Pain Location: Gestures toward abdomen/groin when asked about pain. Pain Descriptors / Indicators: Aching;Grimacing Pain Intervention(s): Limited activity within patient's tolerance;Monitored during session;Repositioned;Premedicated before session  Home Living                                          Prior Functioning/Environment              Frequency  Min 3X/week        Progress Toward Goals  OT Goals(current goals can now be found in the care plan section)  Progress towards OT goals: Progressing toward goals  Acute Rehab OT Goals Patient Stated Goal: get better OT Goal Formulation: With patient/family Time For Goal Achievement: 03/23/21 Potential to Achieve Goals: Good  Plan Discharge plan needs to be updated;Frequency needs to be updated    Co-evaluation    PT/OT/SLP Co-Evaluation/Treatment: Yes Reason for Co-Treatment: For patient/therapist safety;To address functional/ADL transfers PT goals addressed during session: Mobility/safety with mobility;Balance;Strengthening/ROM OT goals addressed during session: ADL's and self-care      AM-PAC OT "6 Clicks" Daily Activity     Outcome Measure   Help from another person eating meals?: A Lot (NG tube) Help from another person taking care of personal grooming?: A Lot Help from another person toileting, which includes using toliet, bedpan, or urinal?: Total Help from another person bathing (including washing, rinsing, drying)?: Total Help from another person to put on and taking off regular upper body clothing?: A Lot Help from another person to put on and taking off regular lower body clothing?: Total 6 Click Score: 9    End of Session    OT Visit Diagnosis: Other abnormalities of gait and mobility (R26.89);Muscle weakness (generalized) (M62.81);Other symptoms and signs involving cognitive function   Activity Tolerance Patient tolerated treatment well   Patient Left in bed;with call  bell/phone within reach;with family/visitor present   Nurse Communication          Time: 6553-7482 OT Time Calculation (min): 27 min  Charges: OT General Charges $OT Visit: 1 Visit OT Treatments $Self Care/Home Management : 8-22 mins  Hanley Hays, MPH, MS, OTR/L ascom [phone] 03/12/21, 11:59 AM

## 2021-03-12 NOTE — TOC Progression Note (Signed)
Transition of Care [facility]) - Progression Note    Patient Details  Name: Eddie Chapman MRN: [id number] Date of Birth: 10-Jan-1961  Transition of Care [facility]) CM/SW [place], [place] Phone Number: [phone] 03/12/2021, 4:20 PM  Clinical Narrative:     CSW called [facility] inpatient [facility], but could not leave voicemail for admissions office because voicemail is full. CSW try again tomorrow.       Expected Discharge Plan and Services                                                 Social Determinants of Health (SDOH) Interventions    Readmission Risk Interventions No flowsheet data found.

## 2021-03-12 NOTE — Plan of Care (Signed)
Palliative: Conference with CCM attending.  Mr. Rinkenberger has goals set for CIR rehab.  He is working with PT successfully.  DNR, but with aggressive care.  Goals are set, please reconsult palliative team if declines are noted.  Plan: Continue to treat the treatable but no CPR or intubation.  Attempting CIR rehab.  No charge Quinn Axe, NP Palliative medicine team Team phone [phone] Greater than 50% of this time was spent counseling and coordinating care related to the above assessment and plan.

## 2021-03-12 NOTE — Progress Notes (Addendum)
Physical Therapy Treatment Patient Details Name: Eddie Chapman MRN: [id number] DOB: 19-May-1961 Today's Date: 03/12/2021    History of Present Illness Eddie Chapman is a 60 y.o. with a complex medical history including systolic CHF chronically, pulmonary hypertension, obstructive sleep apnea, chronic hypoxemia with respiratory failure, obesity hypoventilation syndrome with thoracic restriction, hormonal dysfunction with hypotestosteronism, dyslipidemia, metabolic syndrome, previous subarachnoid hemorrhage, history of traumatic thoracic spinal injury, & depression. He presented to [facility] ED on 02/23/21 with c/o of confusion & SOB for at least 3 days. Pt was admitted to CCU for ARF. he failed extubation and had trach placed on 03/07/21.    PT Comments    Patient alert throughout session, able to follow one step commands consistently. Pt mouthing words and often trying to speak to PT. Unable to write at this time despite repositioning and assist. Mildly impulsive with some mobility this session. Pt still required ultimately 3+ assist for bed mobility, progressed to sitting EOB with minA for RLE. Pt able to scoot anteriorly towards EOB without assist, though deficits in safety awareness. Returned to supine and rolling performed with maxAx2 for pericare and linen change. The patient would benefit from further skilled PT intervention to continue to progress towards goals. Recommendation remains appropriate.    Follow Up Recommendations  CIR     Equipment Recommendations   (TBD at next venue of care)    Recommendations for Other Services       Precautions / Restrictions Precautions Precautions: Fall Precaution Comments: safety mitts, ventilated with trach on eval Restrictions Weight Bearing Restrictions: No    Mobility  Bed Mobility Overal bed mobility: Needs Assistance Bed Mobility: Supine to Sit;Sit to Supine;Rolling Rolling: Max assist;+2 for physical assistance   Supine to sit: +2 for  physical assistance;HOB elevated;+2 for safety/equipment (+3) Sit to supine: +2 for physical assistance;HOB elevated;+2 for safety/equipment (+3 for safety)   General bed mobility comments: +3 for supine<>sit    Transfers                    Ambulation/Gait                 Stairs             Wheelchair Mobility    Modified Rankin (Stroke Patients Only)       Balance Overall balance assessment: Needs assistance Sitting-balance support: Feet supported Sitting balance-Leahy Scale: Poor Sitting balance - Comments: reliant on BUE support, modA until repositioned to midline, able to decrease to minA Postural control: Right lateral lean                                  Cognition Arousal/Alertness: Awake/alert Behavior During Therapy: Impulsive;WFL for tasks assessed/performed Overall Cognitive Status: Difficult to assess Area of Impairment: Following commands;Safety/judgement;Memory;Awareness;Problem solving                     Memory: Decreased short-term memory Following Commands: Follows one step commands consistently Safety/Judgement: Decreased awareness of safety;Decreased awareness of deficits Awareness: Anticipatory;Emergent Problem Solving: Difficulty sequencing;Decreased initiation;Slow processing;Requires tactile cues;Requires verbal cues General Comments: Cognitive assessment limited by trach impairing communication      Exercises General Exercises - Lower Extremity Ankle Circles/Pumps: AROM;Strengthening;Both;5 reps Heel Slides: AAROM;Strengthening;Both;5 reps Hip ABduction/ADduction: AAROM;Strengthening;Both;5 reps    General Comments        Pertinent Vitals/Pain Pain Assessment: Faces Faces Pain Scale: Hurts a little bit Pain  Location: Gestures toward abdomen/groin when asked about pain. Pain Intervention(s): Repositioned;Monitored during session    Home Living                      Prior Function             PT Goals (current goals can now be found in the care plan section) Progress towards PT goals: Progressing toward goals    Frequency    7X/week      PT Plan Current plan remains appropriate    Co-evaluation              AM-PAC PT "6 Clicks" Mobility   Outcome Measure  Help needed turning from your back to your side while in a flat bed without using bedrails?: Total Help needed moving from lying on your back to sitting on the side of a flat bed without using bedrails?: Total Help needed moving to and from a bed to a chair (including a wheelchair)?: Total Help needed standing up from a chair using your arms (e.g., wheelchair or bedside chair)?: Total Help needed to walk in hospital room?: Total Help needed climbing 3-5 steps with a railing? : Total 6 Click Score: 6    End of Session Equipment Utilized During Treatment: Oxygen (trach) Activity Tolerance: Patient tolerated treatment well Patient left: in bed;with family/visitor present;with call bell/phone within reach (BUE donned) Nurse Communication: Mobility status PT Visit Diagnosis: Muscle weakness (generalized) (M62.81);Difficulty in walking, not elsewhere classified (R26.2);Unsteadiness on feet (R26.81)     Time: 1884-1660 PT Time Calculation (min) (ACUTE ONLY): 48 min  Charges:  $Therapeutic Activity: 23-37 mins                     Lieutenant Diego PT, DPT 11:39 AM,03/12/21

## 2021-03-13 ENCOUNTER — Inpatient Hospital Stay: Payer: PPO

## 2021-03-13 DIAGNOSIS — I5023 Acute on chronic systolic (congestive) heart failure: Secondary | ICD-10-CM | POA: Diagnosis not present

## 2021-03-13 LAB — BASIC METABOLIC PANEL
Anion gap: 10 (ref 5–15)
BUN: 46 mg/dL — ABNORMAL HIGH (ref 8–23)
CO2: 34 mmol/L — ABNORMAL HIGH (ref 22–32)
Calcium: 9.6 mg/dL (ref 8.9–10.3)
Chloride: 107 mmol/L (ref 98–111)
Creatinine, Ser: 1.23 mg/dL (ref 0.61–1.24)
GFR, Estimated: >60 mL/min (ref >60)
Glucose, Bld: 105 mg/dL — ABNORMAL HIGH (ref 70–99)
Potassium: 3.4 mmol/L — ABNORMAL LOW (ref 3.5–5.1)
Sodium: 151 mmol/L — ABNORMAL HIGH (ref 135–145)

## 2021-03-13 LAB — GLUCOSE, CAPILLARY
Glucose-Capillary: 80 mg/dL (ref 70–99)
Glucose-Capillary: 90 mg/dL (ref 70–99)
Glucose-Capillary: 135 mg/dL — ABNORMAL HIGH (ref 70–99)
Glucose-Capillary: 115 mg/dL — ABNORMAL HIGH (ref 70–99)
Glucose-Capillary: 131 mg/dL — ABNORMAL HIGH (ref 70–99)
Glucose-Capillary: 105 mg/dL — ABNORMAL HIGH (ref 70–99)

## 2021-03-13 LAB — CBC
HCT: 38.7 % — ABNORMAL LOW (ref 39.0–52.0)
Hemoglobin: 12.5 g/dL — ABNORMAL LOW (ref 13.0–17.0)
MCH: 32.7 pg (ref 26.0–34.0)
MCHC: 32.3 g/dL (ref 30.0–36.0)
MCV: 101.3 fL — ABNORMAL HIGH (ref 80.0–100.0)
Platelets: 163 10*3/uL (ref 150–400)
RBC: 3.82 MIL/uL — ABNORMAL LOW (ref 4.22–5.81)
RDW: 12.9 % (ref 11.5–15.5)
WBC: 9.8 10*3/uL (ref 4.0–10.5)
nRBC: 0 % (ref 0.0–0.2)

## 2021-03-13 LAB — PHOSPHORUS: Phosphorus: 3.6 mg/dL (ref 2.5–4.6)

## 2021-03-13 LAB — MAGNESIUM: Magnesium: 2.3 mg/dL (ref 1.7–2.4)

## 2021-03-13 MED ORDER — LACTATED RINGERS IV BOLUS
500.0000 mL | Freq: Once | INTRAVENOUS | Status: AC
  Administered 2021-03-13: 500 mL via INTRAVENOUS

## 2021-03-13 MED ORDER — ARIPIPRAZOLE 5 MG PO TABS
5.0000 mg | ORAL_TABLET | Freq: Every day | ORAL | Status: DC
  Administered 2021-03-13 – 2021-03-26 (×13): 5 mg
  Filled 2021-03-13 (×14): qty 1

## 2021-03-13 MED ORDER — FUROSEMIDE 10 MG/ML IJ SOLN: 60.0000 mg | Freq: Every day | INTRAMUSCULAR | Status: DC

## 2021-03-13 MED ORDER — GLYCOPYRROLATE 1 MG PO TABS
1.0000 mg | ORAL_TABLET | Freq: Two times a day (BID) | ORAL | Status: DC
  Administered 2021-03-13 (×2): 1 mg
  Filled 2021-03-13 (×4): qty 1

## 2021-03-13 MED ORDER — POTASSIUM CHLORIDE 20 MEQ PO PACK
40.0000 meq | PACK | Freq: Once | ORAL | Status: AC
  Administered 2021-03-13: 40 meq
  Filled 2021-03-13: qty 2

## 2021-03-13 MED ORDER — FREE WATER
20.0000 mL | Freq: Four times a day (QID) | Status: DC
  Administered 2021-03-13 – 2021-03-14 (×4): 20 mL

## 2021-03-13 NOTE — Progress Notes (Signed)
Physical Therapy Treatment Patient Details Name: Eddie Chapman MRN: [id number] DOB: Sep 24, 1961 Today's Date: 03/13/2021    History of Present Illness Eddie Chapman is a 60 y.o. with a complex medical history including systolic CHF chronically, pulmonary hypertension, obstructive sleep apnea, chronic hypoxemia with respiratory failure, obesity hypoventilation syndrome with thoracic restriction, hormonal dysfunction with hypotestosteronism, dyslipidemia, metabolic syndrome, previous subarachnoid hemorrhage, history of traumatic thoracic spinal injury, & depression. He presented to [facility] ED on 02/23/21 with c/o of confusion & SOB for at least 3 days. Pt was admitted to CCU for ARF. he failed extubation and had trach placed on 03/07/21.    PT Comments    Patient easily wakes at start of session. BP initially low, but with repositioning in bed and several UE and LE exercises improved to [facility]. The patient demonstrated great progress towards goals this session. 2+ for bed mobility to sit EOB, several instances of sitting with fair balance and CGA. Sit <> Stand 4 times, modAx2 with bilateral knee block. Pt returned to supine maxAx3, and performed bed mobility for linen change. The patient remains motivated and an excellent candidate for CIR, recommendations remains appropriate.       Follow Up Recommendations  CIR     Equipment Recommendations  Other (comment) (TBD at next venue of care)    Recommendations for Other Services       Precautions / Restrictions Precautions Precautions: Fall Precaution Comments: ventilated with trach Restrictions Weight Bearing Restrictions: No    Mobility  Bed Mobility Overal bed mobility: Needs Assistance Bed Mobility: Supine to Sit;Sit to Supine;Rolling Rolling: Max assist;+2 for physical assistance (+3)   Supine to sit: +2 for physical assistance;HOB elevated;+2 for safety/equipment (+3) Sit to supine: +2 for physical assistance;HOB elevated;+2 for  safety/equipment (+3)        Transfers Overall transfer level: Needs assistance Equipment used: 2 person hand held assist Transfers: Sit to/from Stand Sit to Stand: Mod assist;+2 physical assistance         General transfer comment: bilateral knees blocked, able to sit <> stand 4 times  Ambulation/Gait                 Stairs             Wheelchair Mobility    Modified Rankin (Stroke Patients Only)       Balance Overall balance assessment: Needs assistance Sitting-balance support: Feet supported;Bilateral upper extremity supported Sitting balance-Leahy Scale: Fair Sitting balance - Comments: able to sit with CGA, with fatigue needed min-modA Postural control: Left lateral lean Standing balance support: Bilateral upper extremity supported Standing balance-Leahy Scale: Poor Standing balance comment: bilateral knees blocked                            Cognition Arousal/Alertness: Awake/alert Behavior During Therapy: WFL for tasks assessed/performed Overall Cognitive Status: Difficult to assess Area of Impairment: Memory                       Following Commands: Follows one step commands consistently   Awareness: Anticipatory;Emergent Problem Solving: Requires verbal cues;Requires tactile cues        Exercises General Exercises - Lower Extremity Ankle Circles/Pumps: AROM;Strengthening;Both;10 reps Heel Slides: AAROM;Strengthening;Both;5 reps Hip ABduction/ADduction: AAROM;Strengthening;Both;5 reps Other Exercises Other Exercises: BUE push/pull x10 against PT resistance Other Exercises: assisted with bed mobility to address BM, 2-3 person due to pt fatigue    General Comments  Pertinent Vitals/Pain Pain Assessment: Faces Faces Pain Scale: Hurts a little bit Pain Location: intermittently with mobility Pain Descriptors / Indicators: Aching;Grimacing Pain Intervention(s): Limited activity within patient's  tolerance;Monitored during session;Repositioned    Home Living                      Prior Function            PT Goals (current goals can now be found in the care plan section) Progress towards PT goals: Progressing toward goals    Frequency    7X/week      PT Plan Current plan remains appropriate    Co-evaluation              AM-PAC PT "6 Clicks" Mobility   Outcome Measure  Help needed turning from your back to your side while in a flat bed without using bedrails?: Total Help needed moving from lying on your back to sitting on the side of a flat bed without using bedrails?: Total Help needed moving to and from a bed to a chair (including a wheelchair)?: Total Help needed standing up from a chair using your arms (e.g., wheelchair or bedside chair)?: Total Help needed to walk in hospital room?: Total Help needed climbing 3-5 steps with a railing? : Total 6 Click Score: 6    End of Session Equipment Utilized During Treatment: Oxygen (trach, vent) Activity Tolerance: Patient tolerated treatment well Patient left: in bed;with family/visitor present;with call bell/phone within reach;with nursing/sitter in room (BUE donned) Nurse Communication: Mobility status PT Visit Diagnosis: Muscle weakness (generalized) (M62.81);Difficulty in walking, not elsewhere classified (R26.2);Unsteadiness on feet (R26.81)     Time: 1314-1410 PT Time Calculation (min) (ACUTE ONLY): 56 min  Charges:  $Therapeutic Exercise: 38-52 mins $Therapeutic Activity: 8-22 mins                     Lieutenant Diego PT, DPT 4:21 PM,03/13/21

## 2021-03-13 NOTE — Progress Notes (Signed)
NAME:  Eddie Chapman, MRN:  [id number], DOB:  [birth year], LOS: 57 ADMISSION DATE:  02/23/2021  Brief Pt Description / Synopsis:  60 yo morbidly obese male with acute on chronic hypoxic and hypercapnic respiratory failure due to [place] and AECOPD, along with severe toxic metabolic encephalopathy.  Failure to wean from vent requiring Tracheostomy on 03/07/2021.  History of Present Illness:   Patient has a complex comorbid history including systolic CHF chronically, pulmonary hypertension, obstructive sleep apnea, chronic hypoxemia with respiratory failure, obesity hypoventilation syndrome with thoracic restriction, hormonal dysfunction with hypotestosteronism, dyslipidemia, metabolic syndrome, previous subarachnoid hemorrhage, history of traumatic thoracic spinal injury, depression.  Patient was admitted in January to the intensive care unit with altered mental status acute hypoxemic and hypercapnic respiratory failure with metabolic encephalopathy and left lower lobe severe pneumonia.  Throughout hospitalization he was noted to have lethargy with hypercapnic encephalopathy and became unresponsive a few times even postextubation while on medical floor.  Patient has never smoked in the past.   On this admission he is poorly responsive with hypercapnic hypoxemic respiratory failure         Significant Hospital Events: Including procedures, antibiotic start and stop dates in addition to other pertinent events       02/24/21- Patient responded well to mechanical ventilation with improvement in hypercapnia on ABG this am.  Seems that he had THC induced apnea/hypopnea based on laboratory testing.  There is bibasilar atelectasis induced severe hypoxemia, recruitment Metaneb therapy has been ordered. Plan to repeat CXR this afternoon with SBT today.  Reviewed plan with RN and RT today.     02/25/21- patient was unable to pass SBT today. He was able to come off propofol with  RASS-0 and is weaned down on levophed.     02/26/21- patient continues to require levophed, he has been difficult to wean. I have asked ID for consultation due to concern for sepsis possibly due to CAP. Unable to perform SBT today. Family at bedside.   02/27/21- patient on PRVC with heavy secretions, difficulty weaning with high narcotic tolerance. Patient had suicide attempt with requirement for psych.   6/1 failing to wean from vent, DNR status 6/2 failed to wean from vent, severe hypoxia 6/3 severe hypoxia, failure to wean from vent, needs [facility], ENT consulted 6/4 failure to wean from  6/6 failure to wean from vent, plan for [facility] 6/7 failure to wean from vent 6/8 plan for [facility] TODAY 6/9 left foot cyanosis, vasc surgery consulted 6/10: Agitation overnight, added oxycodone, valium, seroquel.  SBT in progress.  Working with PT 6/11: Less agitated, however, did not tolerate SBT today 6/12: Dilaudid being weaned off, less agitated.  Can actually speak with trach.  Tolerating SBT 03/12/21- patient febrile, Trache aspirate sent for viral workup. Patient with anasarca on examination. Adequate UOP.  On PRVC 35%, GCS3T.  03/13/21- TCC having difficulty finding CIR placement.  There is RLE swelling and hypothermia and DVT study was ordered, he is with severe anxiety during SBT today and we reviewed MAR with addition of Abilify due to no QTC effect.     Micro Data:  5/27: SARS-CoV-2 and influenza PCR>> negative 5/27: Blood culture x2>> no growth 5/27: Urine>> no growth 5/27: MRSA PCR>> negative 5/28: Sputum>> normal respiratory flora 5/30: Respiratory viral panel>> negative 5/31: SPUTUM CULTURE >> ACINETOBACTER CALCOACETICUS/BAUMANNII COMPLEX     Antimicrobials:  Azithromycin 5/27>>5/29 Ceftriaxone 5/27>>5/29 Vancomycin 5/29>>5/30 Zosyn 5/29>>6/1 Unasyn 6/1>>  Antibiotics Given (last 72 hours)  None      Scheduled Meds:  ARIPiprazole  5 mg Per Tube Daily   chlorhexidine  gluconate (MEDLINE KIT)  15 mL Mouth Rinse BID   Chlorhexidine Gluconate Cloth  6 each Topical Daily   clonazepam  1 mg Per Tube BID   enoxaparin (LOVENOX) injection  0.5 mg/kg Subcutaneous Q24H   feeding supplement (PROSource TF)  90 mL Per Tube QID   free water  20 mL Per Tube Q6H   [START ON 03/14/2021] furosemide  60 mg Intravenous Daily   glycopyrrolate  1 mg Per Tube BID   insulin aspart  0-15 Units Subcutaneous Q4H   mouth rinse  15 mL Mouth Rinse 10 times per day   oxyCODONE  10 mg Per Tube Q6H   pantoprazole (PROTONIX) IV  40 mg Intravenous Q24H   polyethylene glycol  17 g Per Tube Daily   QUEtiapine  50 mg Per Tube QHS   senna-docusate  1 tablet Per Tube BID   sodium chloride flush  10-40 mL Intracatheter Q12H   Continuous Infusions:  sodium chloride Stopped (03/11/21 1855)   dexmedetomidine (PRECEDEX) IV infusion 0.7 mcg/kg/hr (03/13/21 0800)   feeding supplement (VITAL 1.5 CAL) 55 mL/hr at 03/13/21 0700   PRN Meds:.bisacodyl, docusate sodium, hydrALAZINE, midazolam, polyethylene glycol, polyvinyl alcohol, sodium chloride flush    Objective   Blood pressure (!) 133/98, pulse (!) 112, temperature 100.3 F (37.9 C), temperature source Oral, resp. rate (!) 22, height _0  (1.778 m), weight (!) 147.3 kg, SpO2 96 %.    Vent Mode: PRVC FiO2 (%):  [35 %] 35 % Set Rate:  [15 bmp] 15 bmp Vt Set:  [550 mL] 550 mL PEEP:  [5 cmH20] 5 cmH20 Plateau Pressure:  [22 cmH20] 22 cmH20   Intake/Output Summary (Last 24 hours) at 03/13/2021 1016 Last data filed at 03/13/2021 0936 Gross per 24 hour  Intake 3287.94 ml  Output 2500 ml  Net 787.94 ml    Filed Weights   03/10/21 0500 03/11/21 0500 03/13/21 0500  Weight: (!) 148.9 kg (!) 146.7 kg (!) 147.3 kg      REVIEW OF SYSTEMS PATIENT IS UNABLE TO PROVIDE COMPLETE REVIEW OF SYSTEMS DUE TO SEVERE CRITICAL ILLNESS AND TOXIC METABOLIC ENCEPHALOPATHY   PHYSICAL EXAMINATION:  GENERAL: Morbidly obese, chronically ill-appearing  male, laying in bed, undergoing spontaneous breathing trial, no acute distress NECK: Supple.  No, JVD, tracheostomy midline PULMONARY: Mechanical breath sounds, no tachypnea, even CARDIOVASCULAR: S1 and S2. Regular rate and rhythm. No murmurs, rubs, or gallops.  GASTROINTESTINAL: Massively obese, soft, nontender, nondistended, no guarding or rebound tenderness, bowel sounds positive x4 MUSCULOSKELETAL: 3+ edema NEUROLOGIC: GCS7T  SKIN: Warm and dry.  No obvious rashes, lesions, ulcerations     Assessment / Plan:    Acute on Chronic Hypoxic and Hypercapnic Respiratory Failure in the setting of [place] and AECOPD -Unable to wean from vent ~ S/P tracheostomy on 03/07/21 -Full vent support, implement lung protective strategies -Wean FiO2 and PEEP as tolerated to maintain O2 sats 88 to 92% -Follow intermittent chest x-ray and ABG as needed -Spontaneous breathing trial/trach collar trials as tolerated, tolerating today -Bronchodilators and budesonide nebs -Completed antibiotics             Short term plan for next 24h: >> reduce IV infusion volume.  Reduce sedative/centrally acting RX.  Repeat SBT.  Continue Diuresis.  ACINETOBACTER CALCOACETICUS/BAUMANNII COMPLEX PNEUMONIA -Monitor fever curve -Trend WBCs -Follow cultures as above -To complete course of Unasyn  Septic Shock>>resolved PMHx of Hypertension -Continuous cardiac monitoring -Maintain MAP greater than 65 -Vasopressors weaned off -Patient now hypertensive, discontinued midodrine  Acute toxic metabolic encephalopathy, need for sedation -Maintain RASS goal of 0 to -1 -Dilaudid and Precedex drips as needed -Weaning off Dilaudid -Continue to wean off sedatives -Avoid sedating medications as able -Add scheduled oxycodone, Seroquel -Discontinued Valium, Klonopin 1 mg twice daily -Provide supportive care, encourage family presence -Promote normal sleep/wake cycle -PT/OT consults    Best practice  (right click and "Reselect all SmartList Selections" daily)  Diet:  Tube Feed  Pain/Anxiety/Delirium protocol (if indicated): Yes (RASS goal 0 to -1 ) VAP protocol (if indicated): Yes DVT prophylaxis: Subcutaneous Heparin GI prophylaxis: PPI Glucose control:  SSI Yes Central venous access:  Yes, and it is still needed Arterial line:  N/A Foley:  N/A Mobility:  bed rest  PT consulted: yes Code Status:  DNR Disposition: ICU    Labs   CBC: Recent Labs  Lab 03/09/21 0423 03/10/21 0420 03/11/21 0549 03/12/21 0539 03/13/21 0512  WBC 10.3 8.0 8.9 8.0 9.8  HGB 12.5* 11.4* 11.6* 10.8* 12.5*  HCT 39.8 37.5* 36.9* 33.4* 38.7*  MCV 103.6* 106.5* 105.1* 102.5* 101.3*  PLT 176 152 153 130* 163     Basic Metabolic Panel: Recent Labs  Lab 03/09/21 0423 03/10/21 0420 03/11/21 0549 03/12/21 0539 03/13/21 0512  NA 153* 152* 152* 151* 151*  K 3.8 3.5 3.9 3.0* 3.4*  CL 106 112* 109 112* 107  CO2 36* 34* 37* 34* 34*  GLUCOSE 155* 131* 154* 101* 105*  BUN 47* 33* 38* 34* 46*  CREATININE 1.24 0.99 1.15 0.93 1.23  CALCIUM 9.7 8.3* 9.1 8.1* 9.6  MG 2.3 1.8 2.1 1.7 2.3  PHOS 3.9 3.7 4.1 2.5 3.6    GFR: Estimated Creatinine Clearance: 91.6 mL/min (by C-G formula based on SCr of 1.23 mg/dL). Recent Labs  Lab 03/10/21 0420 03/11/21 0549 03/12/21 0539 03/13/21 0512  WBC 8.0 8.9 8.0 9.8     Liver Function Tests: No results for input(s): AST, ALT, ALKPHOS, BILITOT, PROT, ALBUMIN in the last 168 hours. No results for input(s): LIPASE, AMYLASE in the last 168 hours. No results for input(s): AMMONIA in the last 168 hours.  ABG    Component Value Date/Time   PHART 7.40 03/02/2021 2000   PCO2ART 69 (HH) 03/02/2021 2000   PO2ART 137 (H) 03/02/2021 2000   HCO3 42.7 (H) 03/02/2021 2000   O2SAT 99.1 03/02/2021 2000      Coagulation Profile: No results for input(s): INR, PROTIME in the last 168 hours.  Cardiac Enzymes: No results for input(s): CKTOTAL, CKMB, CKMBINDEX,  TROPONINI in the last 168 hours.  HbA1C: Hgb A1c MFr Bld  Date/Time Value Ref Range Status  03/02/2021 04:52 AM 5.9 (H) 4.8 - 5.6 % Final    Comment:    (NOTE)         Prediabetes: 5.7 - 6.4         Diabetes: >6.4         Glycemic control for adults with diabetes: <7.0     CBG: Recent Labs  Lab 03/12/21 1543 03/12/21 2012 03/12/21 2331 03/13/21 0408 03/13/21 0725  GLUCAP 162* 146* 121* 80 90     Allergies Allergies  Allergen Reactions   Divalproex Sodium Other (See Comments)    Elevated liver enzymes   Valproic Acid     unknown     The patient is critically ill with multiple organ systems failure and requires  high complexity decision making for assessment and support, frequent evaluation and titration of therapies, application of advanced monitoring technologies and extensive interpretation of multiple databases. Critical Care Time devoted to patient care services described in this note is 35 minutes.      Ottie Glazier, M.D.  [facility] [place]     *This note was dictated using voice recognition software/Dragon.  Despite best efforts to proofread, errors can occur which can change the meaning.  Any change was purely unintentional.

## 2021-03-13 NOTE — Plan of Care (Signed)
  Problem: Clinical Measurements: Goal: Will remain free from infection Outcome: Progressing Goal: Diagnostic test results will improve Outcome: Progressing Goal: Respiratory complications will improve Outcome: Progressing   Problem: Coping: Goal: Level of anxiety will decrease Outcome: Progressing   Problem: Elimination: Goal: Will not experience complications related to bowel motility Outcome: Progressing   

## 2021-03-13 NOTE — Consult Note (Signed)
Physical Medicine and Rehabilitation Consult Reason for Consult: Assess candidacy for CIR  HPI: Eddie Chapman is a 60 y.o. male with a PMH of systolic CHF, pulmonary hypertension, OSA, chronic hypoxemia with respiratory failure, obesity hypoventiation syndrome with thoracic restriction, hormonal dysfunction with hypotestosteronism, dyslipidemia, metabolic syndrome, previous subarachnoid hemorrhage, history of traumatic thoracic spinal injury, & depression. He presented to [facility] ED on 02/23/21 with c/o of confusion & SOB for at least 3 days. Pt was admitted to CCU for ARF. he failed extubation and had trach placed on 03/07/21. Physical Medicine & Rehabilitation was consulted to assess candidacy for CIR.     Review of Systems  Reason unable to perform ROS: trach in place, unable to phonate.  Constitutional:  Positive for malaise/fatigue.  HENT: Negative.    Skin: Negative.   Past Medical History:  Diagnosis Date   Adenomatous colon polyp    Depression    Ear drum perforation    left x2    GERD (gastroesophageal reflux disease)    Hyperlipidemia    Hypertension    T8 vertebral fracture [facility])    Past Surgical History:  Procedure Laterality Date   CARPAL TUNNEL RELEASE     left hand   COLONOSCOPY  09/18/2009, 09/15/2014   COLONOSCOPY WITH PROPOFOL N/A 12/29/2017   Procedure: COLONOSCOPY WITH PROPOFOL;  Surgeon: Manya Silvas, MD;  Location: [facility] ENDOSCOPY;  Service: Endoscopy;  Laterality: N/A;   EYE SURGERY     FRACTURE SURGERY     HERNIA REPAIR     LIPOMA EXCISION     SPINE SURGERY     TRACHEOSTOMY TUBE PLACEMENT N/A 03/07/2021   Procedure: TRACHEOSTOMY;  Surgeon: Margaretha Sheffield, MD;  Location: [facility] ORS;  Service: ENT;  Laterality: N/A;   Family History  Problem Relation Age of Onset   Cancer Mother    Varicose Veins Mother    Social History:  reports that he has never smoked. He has never used smokeless tobacco. He reports that he does not drink alcohol and does not use  drugs. Allergies:  Allergies  Allergen Reactions   Divalproex Sodium Other (See Comments)    Elevated liver enzymes   Valproic Acid     unknown   Medications Prior to Admission  Medication Sig Dispense Refill   acetaminophen (TYLENOL) 325 MG tablet Take 2 tablets (650 mg total) by mouth every 6 (six) hours as needed for fever.     albuterol (VENTOLIN HFA) 108 (90 Base) MCG/ACT inhaler Inhale 2 puffs into the lungs every 6 (six) hours as needed for wheezing or shortness of breath.     aspirin EC 81 MG tablet Take 1 tablet (81 mg total) by mouth daily. 30 tablet 11   DULoxetine (CYMBALTA) 30 MG capsule Take 30 mg by mouth daily.     fluticasone (FLONASE) 50 MCG/ACT nasal spray Place 2 sprays into both nostrils daily as needed for allergies or rhinitis.     furosemide (LASIX) 20 MG tablet Take 20 mg by mouth daily.     HYDROcodone-acetaminophen (NORCO/VICODIN) 5-325 MG tablet Take 1 tablet by mouth every 8 (eight) hours as needed for moderate pain or severe pain.     metoprolol succinate (TOPROL-XL) 100 MG 24 hr tablet Take 1 tablet (100 mg total) by mouth daily. Take with or immediately following a meal.     Multiple Vitamin (MULTIVITAMIN WITH MINERALS) TABS tablet Take 1 tablet by mouth daily.     pantoprazole (PROTONIX) 40 MG tablet  Take 1 tablet (40 mg total) by mouth daily. (Patient taking differently: Take 40 mg by mouth 2 (two) times daily.)     spironolactone (ALDACTONE) 25 MG tablet Take 25 mg by mouth daily.     telmisartan (MICARDIS) 40 MG tablet Take 40 mg by mouth daily.     traZODone (DESYREL) 50 MG tablet Take 50-100 mg by mouth at bedtime as needed for sleep.     feeding supplement (ENSURE ENLIVE / ENSURE PLUS) LIQD Take 237 mLs by mouth 2 (two) times daily between meals. 237 mL 12    Home: Home Living Family/patient expects to be discharged to:: Private residence Living Arrangements: Spouse/significant other (girlfriend) Available Help at Discharge: Family, Available 24  hours/day Type of Home: House Home Access: Stairs to enter [employer] of Steps: 2 Entrance Stairs-Rails: None Home Layout: One level Bathroom Shower/Tub: Chiropodist: Standard Home Equipment: [employer] - single point (rollator)  Functional History: Prior Function Level of Independence: Needs assistance Gait / Transfers Assistance Needed: rollator for gait in home PRN, SPC for community use PRN, pt is able to ambulate around small store (ex: [employer]) but not larger store (ex: [employer]) 2/2 chronic heart issues ADL's / Homemaking Assistance Needed: Per SO, pt requires occsional assistance for LB access during ADL management. Functional Status:  Mobility: Bed Mobility Overal bed mobility: Needs Assistance Bed Mobility: Supine to Sit, Sit to Supine, Rolling Rolling: Max assist, +2 for physical assistance Supine to sit: +2 for physical assistance, HOB elevated, +2 for safety/equipment (+3) Sit to supine: +2 for physical assistance, HOB elevated, +2 for safety/equipment (+3) General bed mobility comments: +3 for supine<>sit Transfers General transfer comment: Deferred for pt safety.      ADL: ADL Overall ADL's : Needs assistance/impaired Grooming: Wash/dry face, Oral care, Bed level, Moderate assistance Grooming Details (indicate cue type and reason): Cues to initiate, MOD A to complete 2/2 BUE weakness, poor activity tolerance General ADL Comments: Pt significantly functionally limited by decreased cognition, cardiopulmonary status, generalized weakness, and significant decreased activity tolerance. He is currently bed level for all ADL management with trach in place, attempting to wean to trach collar. Attempts at functional communication with thumbs up/down signals this date result in limited successful communication. Pt attempts to talk t/o session and does become frustrated with lack of functional communication.  Cognition: Cognition Overall Cognitive  Status: Difficult to assess Orientation Level: Intubated/Tracheostomy - Unable to assess Cognition Arousal/Alertness: Awake/alert Behavior During Therapy: Impulsive, WFL for tasks assessed/performed Overall Cognitive Status: Difficult to assess Area of Impairment: Following commands, Safety/judgement, Memory, Awareness, Problem solving Memory: Decreased short-term memory Following Commands: Follows one step commands consistently Safety/Judgement: Decreased awareness of safety, Decreased awareness of deficits Awareness: Anticipatory, Emergent Problem Solving: Difficulty sequencing, Decreased initiation, Slow processing, Requires tactile cues, Requires verbal cues General Comments: Cognitive assessment limited by trach impairing communication Difficult to assess due to: Tracheostomy  Blood pressure 111/72, pulse 89, temperature 100.3 F (37.9 C), temperature source Oral, resp. rate (!) 21, height 5\' 10"  (1.778 m), weight (!) 147.3 kg, SpO2 99 %. Physical Exam Gen: no distress, on vent HEENT:  +trach, unable to phonate Cardio: Reg rate Chest: normal effort, tachypneic Abd: soft, non-distended Ext: no edema Psych: pleasant, normal affect Skin: intact Neuro: Alert, makes good eye contact, tried to communicate but unable to phonate, expresses understanding Musculoskeletal: Able to move extremities antigravity  Results for orders placed or performed during the hospital encounter of 02/23/21 (from the past 24 hour(s))  Glucose,  capillary     Status: Abnormal   Collection Time: 03/12/21  8:12 PM  Result Value Ref Range   Glucose-Capillary 146 (H) 70 - 99 mg/dL   Comment 1 Notify RN   Glucose, capillary     Status: Abnormal   Collection Time: 03/12/21 11:31 PM  Result Value Ref Range   Glucose-Capillary 121 (H) 70 - 99 mg/dL   Comment 1 Notify RN   Glucose, capillary     Status: None   Collection Time: 03/13/21  4:08 AM  Result Value Ref Range   Glucose-Capillary 80 70 - 99 mg/dL    Comment 1 Notify RN   Magnesium     Status: None   Collection Time: 03/13/21  5:12 AM  Result Value Ref Range   Magnesium 2.3 1.7 - 2.4 mg/dL  Phosphorus     Status: None   Collection Time: 03/13/21  5:12 AM  Result Value Ref Range   Phosphorus 3.6 2.5 - 4.6 mg/dL  Basic metabolic panel     Status: Abnormal   Collection Time: 03/13/21  5:12 AM  Result Value Ref Range   Sodium 151 (H) 135 - 145 mmol/L   Potassium 3.4 (L) 3.5 - 5.1 mmol/L   Chloride 107 98 - 111 mmol/L   CO2 34 (H) 22 - 32 mmol/L   Glucose, Bld 105 (H) 70 - 99 mg/dL   BUN 46 (H) 8 - 23 mg/dL   Creatinine, Ser 1.23 0.61 - 1.24 mg/dL   Calcium 9.6 8.9 - 10.3 mg/dL   GFR, Estimated >60 >60 mL/min   Anion gap 10 5 - 15  CBC     Status: Abnormal   Collection Time: 03/13/21  5:12 AM  Result Value Ref Range   WBC 9.8 4.0 - 10.5 K/uL   RBC 3.82 (L) 4.22 - 5.81 MIL/uL   Hemoglobin 12.5 (L) 13.0 - 17.0 g/dL   HCT 38.7 (L) 39.0 - 52.0 %   MCV 101.3 (H) 80.0 - 100.0 fL   MCH 32.7 26.0 - 34.0 pg   MCHC 32.3 30.0 - 36.0 g/dL   RDW 12.9 11.5 - 15.5 %   Platelets 163 150 - 400 K/uL   nRBC 0.0 0.0 - 0.2 %  Glucose, capillary     Status: None   Collection Time: 03/13/21  7:25 AM  Result Value Ref Range   Glucose-Capillary 90 70 - 99 mg/dL  Glucose, capillary     Status: Abnormal   Collection Time: 03/13/21 11:10 AM  Result Value Ref Range   Glucose-Capillary 135 (H) 70 - 99 mg/dL   US Venous Img Lower Bilateral (DVT)  Result Date: 03/13/2021 CLINICAL DATA:  Bilateral leg swelling EXAM: BILATERAL LOWER EXTREMITY VENOUS DOPPLER ULTRASOUND TECHNIQUE: Gray-scale sonography with graded compression, as well as color Doppler and duplex ultrasound were performed to evaluate the lower extremity deep venous systems from the level of the common femoral vein and including the common femoral, femoral, profunda femoral, popliteal and calf veins including the posterior tibial, peroneal and gastrocnemius veins when visible. The superficial  great saphenous vein was also interrogated. Spectral Doppler was utilized to evaluate flow at rest and with distal augmentation maneuvers in the common femoral, femoral and popliteal veins. COMPARISON:  09/27/2020 FINDINGS: RIGHT LOWER EXTREMITY Common Femoral Vein: No evidence of thrombus. Normal compressibility, respiratory phasicity and response to augmentation. Saphenofemoral Junction: No evidence of thrombus. Normal compressibility and flow on color Doppler imaging. Profunda Femoral Vein: No evidence of thrombus. Normal compressibility and flow  on color Doppler imaging. Femoral Vein: No evidence of thrombus. Normal compressibility, respiratory phasicity and response to augmentation. Popliteal Vein: No evidence of thrombus. Normal compressibility, respiratory phasicity and response to augmentation. Calf Veins: No evidence of thrombus. Normal compressibility and flow on color Doppler imaging. Superficial Great Saphenous Vein: No evidence of thrombus. Normal compressibility. Venous Reflux:  None. Other Findings:  None. LEFT LOWER EXTREMITY Common Femoral Vein: No evidence of thrombus. Normal compressibility, respiratory phasicity and response to augmentation. Saphenofemoral Junction: No evidence of thrombus. Normal compressibility and flow on color Doppler imaging. Profunda Femoral Vein: No evidence of thrombus. Normal compressibility and flow on color Doppler imaging. Femoral Vein: No evidence of thrombus. Normal compressibility, respiratory phasicity and response to augmentation. Popliteal Vein: No evidence of thrombus. Normal compressibility, respiratory phasicity and response to augmentation. Calf Veins: No evidence of thrombus. Normal compressibility and flow on color Doppler imaging. Superficial Great Saphenous Vein: No evidence of thrombus. Normal compressibility. Venous Reflux:  None. Other Findings:  None. IMPRESSION: No evidence of deep venous thrombosis in either lower extremity. Electronically Signed    By: Inez Catalina M.D.   On: 03/13/2021 12:37   DG Chest Port 1 View  Result Date: 03/12/2021 CLINICAL DATA:  Acute respiratory failure EXAM: PORTABLE CHEST 1 VIEW COMPARISON:  Radiograph 03/02/2021, CT 09/25/2020 FINDINGS: Tracheostomy tube tip terminates in the mid mid to upper trachea, 6.7 cm from the carina. Transesophageal tube tip and side port are distal to the GE junction, terminating below the margins of imaging. Bibasilar atelectasis. Some additional hazy ill-defined streaky and patchy opacities throughout both lungs are not significantly changed from the prior. Extension weighted prominence of the cardiomediastinal contours likely attributable to abundant mediastinal fat and patient rotation. Bilateral pleural thickening corresponding to abundant subpleural fat seen on comparison CT imaging. No visible pneumothorax or layering effusion. Multiple remote appearing left rib fractures are again noted. No acute osseous or chest wall abnormalities. Telemetry leads overlie the chest. IMPRESSION: Bibasilar atelectasis. Some additional hazy and ill-defined streaky and patchy opacities throughout the lungs may reflect further volume loss or airspace disease, similar to prior counting for differences in positioning. Electronically Signed   By: Lovena Le M.D.   On: 03/12/2021 05:45    Assessment/Plan: 1) Cardiopulmonary debility: -hopeful that patient will eventually be a good CIR candidate. He was able to stand today which is excellent progress! Wife's first preference is [facility] for ease of visitation. Discussed with patient and wife the differences between CIR and SNF. Recommended to patient that he perform leg and arm lifts 10x/hour to maintain/improve his muscle strength and to prevent clots. Advised wife that we will continue to follow weekly as he weans off ventilator and continues to make progress with therapy.   Thank you for this consult.   Izora Ribas, MD 03/13/2021

## 2021-03-13 NOTE — Progress Notes (Signed)
Suctures removed from the trach. Pt tolerated fine.

## 2021-03-14 LAB — CBC WITH DIFFERENTIAL/PLATELET
Abs Immature Granulocytes: 0.07 10*3/uL (ref 0.00–0.07)
Basophils Absolute: 0 10*3/uL (ref 0.0–0.1)
Basophils Relative: 0 %
Eosinophils Absolute: 0.1 10*3/uL (ref 0.0–0.5)
Eosinophils Relative: 1 %
HCT: 39 % (ref 39.0–52.0)
Hemoglobin: 12.1 g/dL — ABNORMAL LOW (ref 13.0–17.0)
Immature Granulocytes: 1 %
Lymphocytes Relative: 10 %
Lymphs Abs: 1 10*3/uL (ref 0.7–4.0)
MCH: 32.1 pg (ref 26.0–34.0)
MCHC: 31 g/dL (ref 30.0–36.0)
MCV: 103.4 fL — ABNORMAL HIGH (ref 80.0–100.0)
Monocytes Absolute: 1 10*3/uL (ref 0.1–1.0)
Monocytes Relative: 10 %
Neutro Abs: 7.9 10*3/uL — ABNORMAL HIGH (ref 1.7–7.7)
Neutrophils Relative %: 78 %
Platelets: 179 10*3/uL (ref 150–400)
RBC: 3.77 MIL/uL — ABNORMAL LOW (ref 4.22–5.81)
RDW: 12.9 % (ref 11.5–15.5)
WBC: 10.1 10*3/uL (ref 4.0–10.5)
nRBC: 0 % (ref 0.0–0.2)

## 2021-03-14 LAB — BASIC METABOLIC PANEL
Anion gap: 8 (ref 5–15)
BUN: 55 mg/dL — ABNORMAL HIGH (ref 8–23)
CO2: 35 mmol/L — ABNORMAL HIGH (ref 22–32)
Calcium: 9.2 mg/dL (ref 8.9–10.3)
Chloride: 109 mmol/L (ref 98–111)
Creatinine, Ser: 1.56 mg/dL — ABNORMAL HIGH (ref 0.61–1.24)
GFR, Estimated: 50 mL/min — ABNORMAL LOW (ref >60)
Glucose, Bld: 154 mg/dL — ABNORMAL HIGH (ref 70–99)
Potassium: 3.4 mmol/L — ABNORMAL LOW (ref 3.5–5.1)
Sodium: 152 mmol/L — ABNORMAL HIGH (ref 135–145)

## 2021-03-14 LAB — GLUCOSE, CAPILLARY
Glucose-Capillary: 102 mg/dL — ABNORMAL HIGH (ref 70–99)
Glucose-Capillary: 114 mg/dL — ABNORMAL HIGH (ref 70–99)
Glucose-Capillary: 121 mg/dL — ABNORMAL HIGH (ref 70–99)
Glucose-Capillary: 121 mg/dL — ABNORMAL HIGH (ref 70–99)
Glucose-Capillary: 122 mg/dL — ABNORMAL HIGH (ref 70–99)
Glucose-Capillary: 129 mg/dL — ABNORMAL HIGH (ref 70–99)

## 2021-03-14 LAB — PHOSPHORUS: Phosphorus: 4.2 mg/dL (ref 2.5–4.6)

## 2021-03-14 LAB — MAGNESIUM: Magnesium: 2.5 mg/dL — ABNORMAL HIGH (ref 1.7–2.4)

## 2021-03-14 MED ORDER — CLONIDINE HCL 0.1 MG PO TABS: 0.2000 mg | ORAL_TABLET | Freq: Two times a day (BID) | ORAL | Status: DC

## 2021-03-14 MED ORDER — FREE WATER
50.0000 mL | Status: DC
  Administered 2021-03-14: 50 mL

## 2021-03-14 MED ORDER — TRAZODONE HCL 50 MG PO TABS
50.0000 mg | ORAL_TABLET | Freq: Every evening | ORAL | Status: DC | PRN
Start: 2021-03-14 — End: 2021-03-14

## 2021-03-14 MED ORDER — GLYCOPYRROLATE 1 MG PO TABS
1.0000 mg | ORAL_TABLET | Freq: Two times a day (BID) | ORAL | Status: DC | PRN
  Filled 2021-03-14: qty 1

## 2021-03-14 MED ORDER — TRAZODONE HCL 50 MG PO TABS
50.0000 mg | ORAL_TABLET | Freq: Every evening | ORAL | Status: DC | PRN
  Administered 2021-03-16: 75 mg
  Administered 2021-03-18 – 2021-03-20 (×2): 100 mg
  Administered 2021-03-22: 50 mg
  Administered 2021-03-28 – 2021-04-04 (×5): 100 mg
  Filled 2021-03-14 (×3): qty 2
  Filled 2021-03-14 (×2): qty 1
  Filled 2021-03-14 (×4): qty 2

## 2021-03-14 MED ORDER — CLONIDINE HCL 0.1 MG PO TABS: 0.2000 mg | ORAL_TABLET | ORAL | Status: DC

## 2021-03-14 MED ORDER — PANTOPRAZOLE SODIUM 40 MG PO PACK
40.0000 mg | PACK | Freq: Every day | ORAL | Status: DC
  Administered 2021-03-14 – 2021-03-26 (×12): 40 mg
  Filled 2021-03-14 (×12): qty 20

## 2021-03-14 MED ORDER — CLONAZEPAM 0.25 MG PO TBDP
1.0000 mg | ORAL_TABLET | Freq: Two times a day (BID) | ORAL | Status: DC | PRN
  Administered 2021-03-14 – 2021-03-22 (×4): 1 mg
  Filled 2021-03-14 (×4): qty 2

## 2021-03-14 MED ORDER — CLONIDINE HCL 0.1 MG PO TABS: 0.2000 mg | ORAL_TABLET | Freq: Three times a day (TID) | ORAL | Status: DC

## 2021-03-14 MED ORDER — OXYCODONE HCL 5 MG PO TABS
10.0000 mg | ORAL_TABLET | Freq: Four times a day (QID) | ORAL | Status: DC | PRN
  Administered 2021-03-16 – 2021-03-22 (×5): 10 mg
  Filled 2021-03-14 (×5): qty 2

## 2021-03-14 MED ORDER — CLONIDINE HCL 0.1 MG PO TABS
0.2000 mg | ORAL_TABLET | Freq: Four times a day (QID) | ORAL | Status: DC
  Administered 2021-03-14: 0.2 mg
  Filled 2021-03-14: qty 2

## 2021-03-14 MED ORDER — POTASSIUM CHLORIDE 20 MEQ PO PACK
40.0000 meq | PACK | Freq: Once | ORAL | Status: AC
  Administered 2021-03-14: 40 meq
  Filled 2021-03-14: qty 2

## 2021-03-14 NOTE — Progress Notes (Signed)
Pt pulled out NGT, MD made aware and said OK to leave out for now.

## 2021-03-14 NOTE — Progress Notes (Signed)
Occupational Therapy Treatment Patient Details Name: Eddie Chapman MRN: [id number] DOB: Jun 15, 1961 Today's Date: 03/14/2021    History of present illness Eddie Chapman is a 60 y.o. with a complex medical history including systolic CHF chronically, pulmonary hypertension, obstructive sleep apnea, chronic hypoxemia with respiratory failure, obesity hypoventilation syndrome with thoracic restriction, hormonal dysfunction with hypotestosteronism, dyslipidemia, metabolic syndrome, previous subarachnoid hemorrhage, history of traumatic thoracic spinal injury, & depression. He presented to [facility] ED on 02/23/21 with c/o of confusion & SOB for at least 3 days. Pt was admitted to CCU for ARF. he failed extubation and had trach placed on 03/07/21.   OT comments  Pt seen for OT tx this date to f/u re: safety with ADLs/ADL mobility. Pt demos improved cognition and generally is able to tolerate and participate more during session. Pt able to come to sitting x2 trials with MIN A +2. Tolerates ~2 mins first trial (limited primarily d/t short line and need for repositioning), once positioning improved, he tolerates ~5-6 mins on second trial in static EOB sitting with G balance. In bed, pt requires lateral rolling for peri care and linen change with MOD A +2. OT engages pt in peri care with MAX A and hand hygiene with MIN A while in side-lying. Once pt returned to supine, he is able to complete oral care in high-fowler's position with MIN A to SETUP and MIN tactile/verbal/visual cues to initiate task. Pt tolerates well. Left with all needs met and in reach. Will continue to follow. Continue to anticipate that pt will require extensive rehabilitation upon d/c. Should he be able to wean from vent, CIR would still be most appropriate option. Will continue to assess for appropriateness should weaning from mechanical ventilation not occur before otherwise medically stabilizing and prepping to d/c from hospital.     Follow Up  Recommendations  CIR    Equipment Recommendations  Other (comment) (defer to next level of care)    Recommendations for Other Services      Precautions / Restrictions Precautions Precautions: Fall Precaution Comments: ventilated with trach Restrictions Weight Bearing Restrictions: No       Mobility Bed Mobility Overal bed mobility: Needs Assistance Bed Mobility: Supine to Sit;Sit to Supine;Rolling Rolling: +2 for physical assistance;Mod assist   Supine to sit: +2 for physical assistance;HOB elevated;+2 for safety/equipment;Min assist Sit to supine: +2 for physical assistance;HOB elevated;+2 for safety/equipment;Mod assist   General bed mobility comments: imrpoved participation and tolerance as well as recruitment of abdominal muscles    Transfers Overall transfer level: Needs assistance Equipment used: 2 person hand held assist             General transfer comment: deferred today d/t needing suctioning, RN notified    Balance Overall balance assessment: Needs assistance Sitting-balance support: Feet supported;Bilateral upper extremity supported Sitting balance-Leahy Scale: Good Sitting balance - Comments: SBA for static sitting balance this date, some inclination to scoot too  far out to edge of seat requiring cues for safety.                                   ADL either performed or assessed with clinical judgement   ADL Overall ADL's : Needs assistance/impaired     Grooming: Wash/dry hands;Minimal assistance;Bed level;Oral care Grooming Details (indicate cue type and reason): multimodal cues to initiate  Toileting- Clothing Manipulation and Hygiene: Maximal assistance;Bed level Toileting - Clothing Manipulation Details (indicate cue type and reason): lateral rolline technique with MOD A +2, MAX A for actual posterior peri care, but pt does attempt to make meaningful contribution when prompted. Primarily requires  assist for thorough completion 2/2 body habitus.             Vision Patient Visual Report: No change from baseline     Perception     Praxis      Cognition Arousal/Alertness: Awake/alert Behavior During Therapy: WFL for tasks assessed/performed Overall Cognitive Status: Difficult to assess Area of Impairment: Safety/judgement;Problem solving;Following commands;Awareness                       Following Commands: Follows one step commands consistently Safety/Judgement: Decreased awareness of safety;Decreased awareness of deficits Awareness: Anticipatory;Emergent Problem Solving: Requires verbal cues;Requires tactile cues General Comments: improved engagement in session, command following, contribution to ADLs.        Exercises General Exercises - Lower Extremity Heel Slides: AAROM;Strengthening;Both;5 reps Hip ABduction/ADduction: AAROM;Strengthening;Both;5 reps Other Exercises Other Exercises: OT engages pt in be level peri care and washign while in lateral lying position, oral care while in upright bed level, high-fowler's position   Shoulder Instructions       General Comments      Pertinent Vitals/ Pain       Pain Assessment: Faces Faces Pain Scale: Hurts little more Pain Location: intermittently with mobility Pain Descriptors / Indicators: Grimacing;Guarding Pain Intervention(s): Limited activity within patient's tolerance;Monitored during session;Repositioned  Home Living                                          Prior Functioning/Environment              Frequency  Min 3X/week        Progress Toward Goals  OT Goals(current goals can now be found in the care plan section)  Progress towards OT goals: Progressing toward goals  Acute Rehab OT Goals Patient Stated Goal: get better OT Goal Formulation: With patient/family Time For Goal Achievement: 03/23/21 Potential to Achieve Goals: Good  Plan Discharge plan remains  appropriate;Frequency remains appropriate    Co-evaluation    PT/OT/SLP Co-Evaluation/Treatment: Yes Reason for Co-Treatment: Complexity of the patient's impairments (multi-system involvement);For patient/therapist safety;To address functional/ADL transfers PT goals addressed during session: Mobility/safety with mobility;Balance OT goals addressed during session: ADL's and self-care      AM-PAC OT "6 Clicks" Daily Activity     Outcome Measure   Help from another person eating meals?: A Lot Help from another person taking care of personal grooming?: A Lot Help from another person toileting, which includes using toliet, bedpan, or urinal?: Total Help from another person bathing (including washing, rinsing, drying)?: Total Help from another person to put on and taking off regular upper body clothing?: A Lot Help from another person to put on and taking off regular lower body clothing?: Total 6 Click Score: 9    End of Session Equipment Utilized During Treatment: Gait belt;Rolling walker  OT Visit Diagnosis: Other abnormalities of gait and mobility (R26.89);Muscle weakness (generalized) (M62.81);Other symptoms and signs involving cognitive function   Activity Tolerance Patient tolerated treatment well   Patient Left in bed;with call bell/phone within reach;with family/visitor present   Nurse Communication Mobility status  Time: 1302-1400 OT Time Calculation (min): 58 min  Charges: OT General Charges $OT Visit: 1 Visit OT Treatments $Self Care/Home Management : 23-37 mins  Gerrianne Scale, Snyder, OTR/L ascom [phone] 03/14/21, 5:44 PM

## 2021-03-14 NOTE — TOC Progression Note (Addendum)
Transition of Care [facility]) - Progression Note    Patient Details  Name: Eddie Chapman MRN: [id number] Date of Birth: [birth year]  Transition of Care [facility]) CM/SW [place], [place] Phone Number: [phone] 03/14/2021, 11:52 AM  Clinical Narrative:     CSW faxed progress notes for 6/13-6/15, 2022 directly from Epic to [place] at [facility] [phone] fax, [phone] phone, for possible placement.  Facesheet emailed to Jamie [email or web address], unable to fax directly from [place]. CSW updated patient's Baum,[name] (Significant other) [phone] [facility]       Expected Discharge Plan and Services                                                 Social Determinants of Health (SDOH) Interventions    Readmission Risk Interventions No flowsheet data found.

## 2021-03-14 NOTE — Progress Notes (Signed)
PHARMACIST - PHYSICIAN COMMUNICATION  CONCERNING: IV to Oral Route Change Policy  RECOMMENDATION: This patient is receiving pantoprazole by the intravenous route.  Based on criteria approved by the Pharmacy and Therapeutics Committee, the intravenous medication(s) is/are being converted to the equivalent oral dose form(s).   DESCRIPTION: These criteria include: The patient is eating (either orally or via tube) and/or has been taking other orally administered medications for a least 24 hours The patient has no evidence of active gastrointestinal bleeding or impaired GI absorption (gastrectomy, short bowel, patient on TNA or NPO).  If you have questions about this conversion, please contact the [place], [facility] 03/14/2021 7:42 AM

## 2021-03-14 NOTE — Progress Notes (Signed)
NAME:  Eddie Chapman, MRN:  [id number], DOB:  [birth year], LOS: 17 ADMISSION DATE:  02/23/2021  Brief Pt Description / Synopsis:  60 yo morbidly obese male with acute on chronic hypoxic and hypercapnic respiratory failure due to [place] and AECOPD, along with severe toxic metabolic encephalopathy.  Failure to wean from vent requiring Tracheostomy on 03/07/2021.  History of Present Illness:   Patient has a complex comorbid history including systolic CHF chronically, pulmonary hypertension, obstructive sleep apnea, chronic hypoxemia with respiratory failure, obesity hypoventilation syndrome with thoracic restriction, hormonal dysfunction with hypotestosteronism, dyslipidemia, metabolic syndrome, previous subarachnoid hemorrhage, history of traumatic thoracic spinal injury, depression.  Patient was admitted in January to the intensive care unit with altered mental status acute hypoxemic and hypercapnic respiratory failure with metabolic encephalopathy and left lower lobe severe pneumonia.  Throughout hospitalization he was noted to have lethargy with hypercapnic encephalopathy and became unresponsive a few times even postextubation while on medical floor.  Patient has never smoked in the past.   On this admission he is poorly responsive with hypercapnic hypoxemic respiratory failure          Events:      02/24/21- Patient responded well to mechanical ventilation with improvement in hypercapnia on ABG this am.  Seems that he had THC induced apnea/hypopnea based on laboratory testing.  There is bibasilar atelectasis induced severe hypoxemia, recruitment Metaneb therapy has been ordered. Plan to repeat CXR this afternoon with SBT today.  Reviewed plan with RN and RT today.     02/25/21- patient was unable to pass SBT today. He was able to come off propofol with RASS-0 and is weaned down on levophed.     02/26/21- patient continues to require levophed, he has been difficult  to wean. I have asked ID for consultation due to concern for sepsis possibly due to CAP. Unable to perform SBT today. Family at bedside.   02/27/21- patient on PRVC with heavy secretions, difficulty weaning with high narcotic tolerance. Patient had suicide attempt with requirement for psych.   6/1 failing to wean from vent, DNR status 6/2 failed to wean from vent, severe hypoxia 6/3 severe hypoxia, failure to wean from vent, needs [facility], ENT consulted 6/4 failure to wean from  6/6 failure to wean from vent, plan for [facility] 6/7 failure to wean from vent 6/8 plan for [facility] TODAY 6/9 left foot cyanosis, vasc surgery consulted 6/10: Agitation overnight, added oxycodone, valium, seroquel.  SBT in progress.  Working with PT 6/11: Less agitated, however, did not tolerate SBT today 6/12: Dilaudid being weaned off, less agitated.  Can actually speak with trach.  Tolerating SBT 03/12/21- patient febrile, Trache aspirate sent for viral workup. Patient with anasarca on examination. Adequate UOP.  On PRVC 35%, GCS3T.  03/13/21- TCC having difficulty finding CIR placement.  There is RLE swelling and hypothermia and DVT study was ordered, he is with severe anxiety during SBT today and we reviewed MAR with addition of Abilify due to no QTC effect.   03/14/21- patient is awake, he is attempting again for trache collar trial today.  His secretions are improved, he is vocalizing and trying to write for communication.    Micro Data:  5/27: SARS-CoV-2 and influenza PCR>> negative 5/27: Blood culture x2>> no growth 5/27: Urine>> no growth 5/27: MRSA PCR>> negative 5/28: Sputum>> normal respiratory flora 5/30: Respiratory viral panel>> negative 5/31: SPUTUM CULTURE >> ACINETOBACTER CALCOACETICUS/BAUMANNII COMPLEX     Antimicrobials:  Azithromycin 5/27>>5/29 Ceftriaxone 5/27>>5/29 Vancomycin 5/29>>5/30  Zosyn 5/29>>6/1 Unasyn 6/1>>  Antibiotics Given (last 72 hours)     None      Scheduled Meds:   ARIPiprazole  5 mg Per Tube Daily   chlorhexidine gluconate (MEDLINE KIT)  15 mL Mouth Rinse BID   Chlorhexidine Gluconate Cloth  6 each Topical Daily   clonazepam  1 mg Per Tube BID   enoxaparin (LOVENOX) injection  0.5 mg/kg Subcutaneous Q24H   feeding supplement (PROSource TF)  90 mL Per Tube QID   free water  20 mL Per Tube Q6H   glycopyrrolate  1 mg Per Tube BID   insulin aspart  0-15 Units Subcutaneous Q4H   mouth rinse  15 mL Mouth Rinse 10 times per day   oxyCODONE  10 mg Per Tube Q6H   pantoprazole sodium  40 mg Per Tube Daily   polyethylene glycol  17 g Per Tube Daily   potassium chloride  40 mEq Per Tube Once   QUEtiapine  50 mg Per Tube QHS   senna-docusate  1 tablet Per Tube BID   sodium chloride flush  10-40 mL Intracatheter Q12H   Continuous Infusions:  sodium chloride Stopped (03/11/21 1855)   dexmedetomidine (PRECEDEX) IV infusion 0.5 mcg/kg/hr (03/14/21 0700)   feeding supplement (VITAL 1.5 CAL) 1,000 mL (03/13/21 2338)   PRN Meds:.bisacodyl, docusate sodium, hydrALAZINE, midazolam, polyethylene glycol, polyvinyl alcohol, sodium chloride flush    Objective   Blood pressure 137/78, pulse 81, temperature 99.2 F (37.3 C), temperature source Oral, resp. rate 20, height _0  (1.778 m), weight (!) 147.3 kg, SpO2 95 %.    Vent Mode: PRVC FiO2 (%):  [35 %] 35 % Set Rate:  [15 bmp] 15 bmp Vt Set:  [550 mL] 550 mL PEEP:  [5 cmH20] 5 cmH20 Plateau Pressure:  [26 cmH20] 26 cmH20   Intake/Output Summary (Last 24 hours) at 03/14/2021 0823 Last data filed at 03/14/2021 0700 Gross per 24 hour  Intake 2241.66 ml  Output 2100 ml  Net 141.66 ml    Filed Weights   03/10/21 0500 03/11/21 0500 03/13/21 0500  Weight: (!) 148.9 kg (!) 146.7 kg (!) 147.3 kg      REVIEW OF SYSTEMS PATIENT IS UNABLE TO PROVIDE COMPLETE REVIEW OF SYSTEMS DUE TO SEVERE CRITICAL ILLNESS AND  ENCEPHALOPATHY   PHYSICAL EXAMINATION:  GENERAL: Morbidly obese, chronically ill-appearing  male, laying in bed, undergoing spontaneous breathing trial, no acute distress NECK: Supple.  No, JVD, tracheostomy midline PULMONARY: Mechanical breath sounds, no tachypnea, even CARDIOVASCULAR: S1 and S2. Regular rate and rhythm. No murmurs, rubs, or gallops.  GASTROINTESTINAL: Massively obese, soft, nontender, nondistended, no guarding or rebound tenderness, bowel sounds positive x4 MUSCULOSKELETAL: 2+ edema NEUROLOGIC: awake alert with mild encephalopathy no FND grossly  SKIN: Warm and dry.  No obvious rashes, lesions, ulcerations     Assessment / Plan:    Acute on Chronic Hypoxic and Hypercapnic Respiratory Failure in the setting of [place] and AECOPD -Unable to wean from vent ~ S/P tracheostomy on 03/07/21 -Full vent support, implement lung protective strategies -Wean FiO2 and PEEP as tolerated to maintain O2 sats 88 to 92% -Follow intermittent chest x-ray and ABG as needed -Spontaneous breathing trial/trach collar trials as tolerated, tolerating today -Bronchodilators and budesonide nebs -Completed antibiotics             Short term plan for next 24h: >> reduce IV infusion volume.  Reduce sedative/centrally acting RX.  Repeat SBT.  Continue Diuresis.  ACINETOBACTER CALCOACETICUS/BAUMANNII COMPLEX PNEUMONIA -  Monitor fever curve -Trend WBCs -Follow cultures as above -To complete course of Unasyn  Septic Shock>>resolved PMHx of Hypertension -Continuous cardiac monitoring -Maintain MAP greater than 65 -Vasopressors weaned off -Patient now hypertensive, discontinued midodrine  Acute toxic metabolic encephalopathy, need for sedation -Maintain RASS goal of 0 to -1 -Dilaudid and Precedex drips as needed -Weaning off Dilaudid -Continue to wean off sedatives -Avoid sedating medications as able -Add scheduled oxycodone, Seroquel -Discontinued Valium, Klonopin 1 mg twice daily -Provide supportive care, encourage family presence -Promote normal sleep/wake  cycle -PT/OT consults    Best practice (right click and "Reselect all SmartList Selections" daily)  Diet:  Tube Feed  Pain/Anxiety/Delirium protocol (if indicated): Yes (RASS goal 0 to -1 ) VAP protocol (if indicated): Yes DVT prophylaxis: Subcutaneous Heparin GI prophylaxis: PPI Glucose control:  SSI Yes Central venous access:  Yes, and it is still needed Arterial line:  N/A Foley:  N/A Mobility:  bed rest  PT consulted: yes Code Status:  DNR Disposition: ICU    Labs   CBC: Recent Labs  Lab 03/10/21 0420 03/11/21 0549 03/12/21 0539 03/13/21 0512 03/14/21 0446  WBC 8.0 8.9 8.0 9.8 10.1  NEUTROABS  --   --   --   --  7.9*  HGB 11.4* 11.6* 10.8* 12.5* 12.1*  HCT 37.5* 36.9* 33.4* 38.7* 39.0  MCV 106.5* 105.1* 102.5* 101.3* 103.4*  PLT 152 153 130* 163 179     Basic Metabolic Panel: Recent Labs  Lab 03/10/21 0420 03/11/21 0549 03/12/21 0539 03/13/21 0512 03/14/21 0446  NA 152* 152* 151* 151* 152*  K 3.5 3.9 3.0* 3.4* 3.4*  CL 112* 109 112* 107 109  CO2 34* 37* 34* 34* 35*  GLUCOSE 131* 154* 101* 105* 154*  BUN 33* 38* 34* 46* 55*  CREATININE 0.99 1.15 0.93 1.23 1.56*  CALCIUM 8.3* 9.1 8.1* 9.6 9.2  MG 1.8 2.1 1.7 2.3 2.5*  PHOS 3.7 4.1 2.5 3.6 4.2    GFR: Estimated Creatinine Clearance: 72.2 mL/min (A) (by C-G formula based on SCr of 1.56 mg/dL (H)). Recent Labs  Lab 03/11/21 0549 03/12/21 0539 03/13/21 0512 03/14/21 0446  WBC 8.9 8.0 9.8 10.1     Liver Function Tests: No results for input(s): AST, ALT, ALKPHOS, BILITOT, PROT, ALBUMIN in the last 168 hours. No results for input(s): LIPASE, AMYLASE in the last 168 hours. No results for input(s): AMMONIA in the last 168 hours.  ABG    Component Value Date/Time   PHART 7.40 03/02/2021 2000   PCO2ART 69 (HH) 03/02/2021 2000   PO2ART 137 (H) 03/02/2021 2000   HCO3 42.7 (H) 03/02/2021 2000   O2SAT 99.1 03/02/2021 2000      Coagulation Profile: No results for input(s): INR, PROTIME in the  last 168 hours.  Cardiac Enzymes: No results for input(s): CKTOTAL, CKMB, CKMBINDEX, TROPONINI in the last 168 hours.  HbA1C: Hgb A1c MFr Bld  Date/Time Value Ref Range Status  03/02/2021 04:52 AM 5.9 (H) 4.8 - 5.6 % Final    Comment:    (NOTE)         Prediabetes: 5.7 - 6.4         Diabetes: >6.4         Glycemic control for adults with diabetes: <7.0     CBG: Recent Labs  Lab 03/13/21 1553 03/13/21 1929 03/13/21 2316 03/14/21 0348 03/14/21 0713  GLUCAP 115* 131* 105* 121* 114*     Allergies Allergies  Allergen Reactions   Divalproex Sodium Other (See  Comments)    Elevated liver enzymes   Valproic Acid     unknown     The patient is critically ill with multiple organ systems failure and requires high complexity decision making for assessment and support, frequent evaluation and titration of therapies, application of advanced monitoring technologies and extensive interpretation of multiple databases. Critical Care Time devoted to patient care services described in this note is 35 minutes.      Ottie Glazier, M.D.  [facility] [place]     *This note was dictated using voice recognition software/Dragon.  Despite best efforts to proofread, errors can occur which can change the meaning.  Any change was purely unintentional.

## 2021-03-14 NOTE — Progress Notes (Signed)
Physical Therapy Treatment Patient Details Name: Eddie Chapman MRN: [id number] DOB: 06/22/1961 Today's Date: 03/14/2021    History of Present Illness Eddie Chapman is a 60 y.o. with a complex medical history including systolic CHF chronically, pulmonary hypertension, obstructive sleep apnea, chronic hypoxemia with respiratory failure, obesity hypoventilation syndrome with thoracic restriction, hormonal dysfunction with hypotestosteronism, dyslipidemia, metabolic syndrome, previous subarachnoid hemorrhage, history of traumatic thoracic spinal injury, & depression. He presented to [facility] ED on 02/23/21 with c/o of confusion & SOB for at least 3 days. Pt was admitted to CCU for ARF. he failed extubation and had trach placed on 03/07/21.    PT Comments    Seen by Pt/OT for co-treat to maximize mobility safety and independence Patient alert, in bed, mouthing words at PT throughout session. Pt demonstrated more progress this session, especially with bed mobility. The patient was able to perform supine<> sit twice, minAx2. CGA-supervision for sitting. BM noted, and potential desaturation noted in sitting after a few minutes, RN notified (questionable reading of saturation level, but returned to supine for safety). Rolling in bed with modA x2 with pt able to use bed rails. Fatigued at end of session, but indicated that he was comfortable. The patient would benefit from further skilled PT intervention to continue to progress towards goals. Recommendation remains appropriate.       Follow Up Recommendations  CIR     Equipment Recommendations  Other (comment) (TBD at next venue of care)    Recommendations for Other Services       Precautions / Restrictions Precautions Precautions: Fall Precaution Comments: ventilated with trach Restrictions Weight Bearing Restrictions: No    Mobility  Bed Mobility Overal bed mobility: Needs Assistance Bed Mobility: Supine to Sit;Sit to Supine;Rolling Rolling: +2  for physical assistance;Mod assist   Supine to sit: +2 for physical assistance;HOB elevated;+2 for safety/equipment;Min assist Sit to supine: +2 for physical assistance;HOB elevated;+2 for safety/equipment;Mod assist        Transfers Overall transfer level: Needs assistance Equipment used: 2 person hand held assist                Ambulation/Gait                 Stairs             Wheelchair Mobility    Modified Rankin (Stroke Patients Only)       Balance Overall balance assessment: Needs assistance Sitting-balance support: Feet supported;Bilateral upper extremity supported Sitting balance-Leahy Scale: Good                                      Cognition Arousal/Alertness: Awake/alert Behavior During Therapy: WFL for tasks assessed/performed Overall Cognitive Status: Difficult to assess Area of Impairment: Safety/judgement                       Following Commands: Follows one step commands consistently Safety/Judgement: Decreased awareness of safety;Decreased awareness of deficits Awareness: Anticipatory;Emergent Problem Solving: Requires verbal cues;Requires tactile cues General Comments: Cognitive assessment limited by trach impairing communication      Exercises General Exercises - Lower Extremity Heel Slides: AAROM;Strengthening;Both;5 reps Hip ABduction/ADduction: AAROM;Strengthening;Both;5 reps    General Comments        Pertinent Vitals/Pain Pain Assessment: Faces Faces Pain Scale: Hurts little more Pain Location: intermittently with mobility Pain Descriptors / Indicators: Grimacing;Guarding Pain Intervention(s): Limited activity within patient's tolerance;Monitored  during session;Repositioned    Home Living                      Prior Function            PT Goals (current goals can now be found in the care plan section) Progress towards PT goals: Progressing toward goals    Frequency     7X/week      PT Plan Current plan remains appropriate    Co-evaluation   Reason for Co-Treatment: Complexity of the patient's impairments (multi-system involvement);Necessary to address cognition/behavior during functional activity;To address functional/ADL transfers;For patient/therapist safety PT goals addressed during session: Mobility/safety with mobility;Balance;Strengthening/ROM OT goals addressed during session: ADL's and self-care      AM-PAC PT "6 Clicks" Mobility   Outcome Measure  Help needed turning from your back to your side while in a flat bed without using bedrails?: Total Help needed moving from lying on your back to sitting on the side of a flat bed without using bedrails?: Total Help needed moving to and from a bed to a chair (including a wheelchair)?: Total Help needed standing up from a chair using your arms (e.g., wheelchair or bedside chair)?: Total Help needed to walk in hospital room?: Total Help needed climbing 3-5 steps with a railing? : Total 6 Click Score: 6    End of Session Equipment Utilized During Treatment: Oxygen (trach) Activity Tolerance: Patient tolerated treatment well;Other (comment) (more limited by desaturation) Patient left: in bed;with family/visitor present;with call bell/phone within reach;with nursing/sitter in room Nurse Communication: Mobility status PT Visit Diagnosis: Muscle weakness (generalized) (M62.81);Difficulty in walking, not elsewhere classified (R26.2);Unsteadiness on feet (R26.81)     Time: 1322-1401 PT Time Calculation (min) (ACUTE ONLY): 39 min  Charges:  $Therapeutic Activity: 8-22 mins                     Lieutenant Diego PT, DPT 4:17 PM,03/14/21

## 2021-03-15 LAB — BASIC METABOLIC PANEL
Anion gap: 7 (ref 5–15)
BUN: 37 mg/dL — ABNORMAL HIGH (ref 8–23)
CO2: 33 mmol/L — ABNORMAL HIGH (ref 22–32)
Calcium: 9.3 mg/dL (ref 8.9–10.3)
Chloride: 114 mmol/L — ABNORMAL HIGH (ref 98–111)
Creatinine, Ser: 1.29 mg/dL — ABNORMAL HIGH (ref 0.61–1.24)
GFR, Estimated: >60 mL/min (ref >60)
Glucose, Bld: 125 mg/dL — ABNORMAL HIGH (ref 70–99)
Potassium: 3.9 mmol/L (ref 3.5–5.1)
Sodium: 154 mmol/L — ABNORMAL HIGH (ref 135–145)

## 2021-03-15 LAB — CBC WITH DIFFERENTIAL/PLATELET
Abs Immature Granulocytes: 0.08 10*3/uL — ABNORMAL HIGH (ref 0.00–0.07)
Basophils Absolute: 0 10*3/uL (ref 0.0–0.1)
Basophils Relative: 0 %
Eosinophils Absolute: 0 10*3/uL (ref 0.0–0.5)
Eosinophils Relative: 0 %
HCT: 38.1 % — ABNORMAL LOW (ref 39.0–52.0)
Hemoglobin: 12.1 g/dL — ABNORMAL LOW (ref 13.0–17.0)
Immature Granulocytes: 1 %
Lymphocytes Relative: 8 %
Lymphs Abs: 0.8 10*3/uL (ref 0.7–4.0)
MCH: 32.7 pg (ref 26.0–34.0)
MCHC: 31.8 g/dL (ref 30.0–36.0)
MCV: 103 fL — ABNORMAL HIGH (ref 80.0–100.0)
Monocytes Absolute: 0.9 10*3/uL (ref 0.1–1.0)
Monocytes Relative: 10 %
Neutro Abs: 7.8 10*3/uL — ABNORMAL HIGH (ref 1.7–7.7)
Neutrophils Relative %: 81 %
Platelets: 209 10*3/uL (ref 150–400)
RBC: 3.7 MIL/uL — ABNORMAL LOW (ref 4.22–5.81)
RDW: 12.9 % (ref 11.5–15.5)
WBC: 9.6 10*3/uL (ref 4.0–10.5)
nRBC: 0 % (ref 0.0–0.2)

## 2021-03-15 LAB — GLUCOSE, CAPILLARY
Glucose-Capillary: 103 mg/dL — ABNORMAL HIGH (ref 70–99)
Glucose-Capillary: 104 mg/dL — ABNORMAL HIGH (ref 70–99)
Glucose-Capillary: 110 mg/dL — ABNORMAL HIGH (ref 70–99)
Glucose-Capillary: 118 mg/dL — ABNORMAL HIGH (ref 70–99)
Glucose-Capillary: 90 mg/dL (ref 70–99)
Glucose-Capillary: 96 mg/dL (ref 70–99)

## 2021-03-15 LAB — MAGNESIUM: Magnesium: 2.4 mg/dL (ref 1.7–2.4)

## 2021-03-15 LAB — CULTURE, RESPIRATORY W GRAM STAIN

## 2021-03-15 LAB — PHOSPHORUS: Phosphorus: 3.5 mg/dL (ref 2.5–4.6)

## 2021-03-15 NOTE — Progress Notes (Signed)
NAME:  Eddie Chapman, MRN:  [id number], DOB:  03-Aug-1961, LOS: 62 ADMISSION DATE:  02/23/2021  Brief Pt Description / Synopsis:  60 yo morbidly obese male with acute on chronic hypoxic and hypercapnic respiratory failure due to [place] and AECOPD, along with severe toxic metabolic encephalopathy.  Failure to wean from vent requiring Tracheostomy on 03/07/2021.  History of Present Illness:   Patient has a complex comorbid history including systolic CHF chronically, pulmonary hypertension, obstructive sleep apnea, chronic hypoxemia with respiratory failure, obesity hypoventilation syndrome with thoracic restriction, hormonal dysfunction with hypotestosteronism, dyslipidemia, metabolic syndrome, previous subarachnoid hemorrhage, history of traumatic thoracic spinal injury, depression.  Patient was admitted in January to the intensive care unit with altered mental status acute hypoxemic and hypercapnic respiratory failure with metabolic encephalopathy and left lower lobe severe pneumonia.  Throughout hospitalization he was noted to have lethargy with hypercapnic encephalopathy and became unresponsive a few times even postextubation while on medical floor.  Patient has never smoked in the past.   On this admission he is poorly responsive with hypercapnic hypoxemic respiratory failure          Events:      02/24/21- Patient responded well to mechanical ventilation with improvement in hypercapnia on ABG this am.  Seems that he had THC induced apnea/hypopnea based on laboratory testing.  There is bibasilar atelectasis induced severe hypoxemia, recruitment Metaneb therapy has been ordered. Plan to repeat CXR this afternoon with SBT today.  Reviewed plan with RN and RT today.     02/25/21- patient was unable to pass SBT today. He was able to come off propofol with RASS-0 and is weaned down on levophed.     02/26/21- patient continues to require levophed, he has been difficult  to wean. I have asked ID for consultation due to concern for sepsis possibly due to CAP. Unable to perform SBT today. Family at bedside.   02/27/21- patient on PRVC with heavy secretions, difficulty weaning with high narcotic tolerance. Patient had suicide attempt with requirement for psych.   6/1 failing to wean from vent, DNR status 6/2 failed to wean from vent, severe hypoxia 6/3 severe hypoxia, failure to wean from vent, needs [facility], ENT consulted 6/4 failure to wean from  6/6 failure to wean from vent, plan for [facility] 6/7 failure to wean from vent 6/8 plan for [facility] TODAY 6/9 left foot cyanosis, vasc surgery consulted 6/10: Agitation overnight, added oxycodone, valium, seroquel.  SBT in progress.  Working with PT 6/11: Less agitated, however, did not tolerate SBT today 6/12: Dilaudid being weaned off, less agitated.  Can actually speak with trach.  Tolerating SBT 03/12/21- patient febrile, Trache aspirate sent for viral workup. Patient with anasarca on examination. Adequate UOP.  On PRVC 35%, GCS3T.  03/13/21- TCC having difficulty finding CIR placement.  There is RLE swelling and hypothermia and DVT study was ordered, he is with severe anxiety during SBT today and we reviewed MAR with addition of Abilify due to no QTC effect.   03/14/21- patient is awake, he is attempting again for trache collar trial today.  His secretions are improved, he is vocalizing and trying to write for communication.  03/15/21- patient is alert he communicates via gesturing and attempts to vocalize.  Still in withdrawal and on precedex but calm and appropriate.  Plan is to wean precedex and get to PT/OT.    Micro Data:  5/27: SARS-CoV-2 and influenza PCR>> negative 5/27: Blood culture x2>> no growth 5/27: Urine>> no  growth 5/27: MRSA PCR>> negative 5/28: Sputum>> normal respiratory flora 5/30: Respiratory viral panel>> negative 5/31: SPUTUM CULTURE >> ACINETOBACTER CALCOACETICUS/BAUMANNII COMPLEX      Antimicrobials:  Azithromycin 5/27>>5/29 Ceftriaxone 5/27>>5/29 Vancomycin 5/29>>5/30 Zosyn 5/29>>6/1 Unasyn 6/1>>  Antibiotics Given (last 72 hours)     None      Scheduled Meds:  ARIPiprazole  5 mg Per Tube Daily   chlorhexidine gluconate (MEDLINE KIT)  15 mL Mouth Rinse BID   Chlorhexidine Gluconate Cloth  6 each Topical Daily   cloNIDine  0.2 mg Per Tube Q6H   Followed by   cloNIDine  0.2 mg Per Tube TID   Followed by   [START ON 03/16/2021] cloNIDine  0.2 mg Per Tube Q12H   Followed by   [START ON 03/17/2021] cloNIDine  0.2 mg Per Tube Q24H   enoxaparin (LOVENOX) injection  0.5 mg/kg Subcutaneous Q24H   feeding supplement (PROSource TF)  90 mL Per Tube QID   free water  50 mL Per Tube Q4H   insulin aspart  0-15 Units Subcutaneous Q4H   mouth rinse  15 mL Mouth Rinse 10 times per day   pantoprazole sodium  40 mg Per Tube Daily   polyethylene glycol  17 g Per Tube Daily   senna-docusate  1 tablet Per Tube BID   sodium chloride flush  10-40 mL Intracatheter Q12H   Continuous Infusions:  sodium chloride Stopped (03/11/21 1855)   dexmedetomidine (PRECEDEX) IV infusion 0.6 mcg/kg/hr (03/15/21 0600)   feeding supplement (VITAL 1.5 CAL) 1,000 mL (03/13/21 2338)   PRN Meds:.bisacodyl, clonazepam, docusate sodium, glycopyrrolate, hydrALAZINE, midazolam, oxyCODONE, polyethylene glycol, polyvinyl alcohol, sodium chloride flush, traZODone    Objective   Blood pressure (!) 155/92, pulse 61, temperature 99.1 F (37.3 C), temperature source Oral, resp. rate (!) 21, height '5\' 10"'  (1.778 m), weight (!) 142.9 kg, SpO2 100 %.    Vent Mode: PRVC FiO2 (%):  [35 %] 35 % Set Rate:  [15 bmp] 15 bmp Vt Set:  [550 mL] 550 mL PEEP:  [5 cmH20] 5 cmH20 Pressure Support:  [10 cmH20] 10 cmH20 Plateau Pressure:  [23 cmH20-26 cmH20] 23 cmH20   Intake/Output Summary (Last 24 hours) at 03/15/2021 0939 Last data filed at 03/15/2021 0600 Gross per 24 hour  Intake 502.23 ml  Output 2350 ml   Net -1847.77 ml    Filed Weights   03/11/21 0500 03/13/21 0500 03/15/21 0500  Weight: (!) 146.7 kg (!) 147.3 kg (!) 142.9 kg      REVIEW OF SYSTEMS PATIENT IS UNABLE TO PROVIDE COMPLETE REVIEW OF SYSTEMS DUE TO SEVERE CRITICAL ILLNESS AND  ENCEPHALOPATHY   PHYSICAL EXAMINATION:  GENERAL: Morbidly obese, chronically ill-appearing male, laying in bed, undergoing spontaneous breathing trial, no acute distress NECK: Supple.  No, JVD, tracheostomy midline PULMONARY: Mechanical breath sounds, no tachypnea, even CARDIOVASCULAR: S1 and S2. Regular rate and rhythm. No murmurs, rubs, or gallops.  GASTROINTESTINAL: Massively obese, soft, nontender, nondistended, no guarding or rebound tenderness, bowel sounds positive x4 MUSCULOSKELETAL: 2+ edema NEUROLOGIC: awake alert with mild encephalopathy no FND grossly  SKIN: Warm and dry.  No obvious rashes, lesions, ulcerations     Assessment / Plan:    Acute on Chronic Hypoxic and Hypercapnic Respiratory Failure in the setting of [place] and AECOPD -Unable to wean from vent ~ S/P tracheostomy on 03/07/21 -Full vent support, implement lung protective strategies -Wean FiO2 and PEEP as tolerated to maintain O2 sats 88 to 92% -Follow intermittent chest x-ray and ABG  as needed -Spontaneous breathing trial/trach collar trials as tolerated, tolerating today -Bronchodilators and budesonide nebs -Completed antibiotics             Short term plan for next 24h: >> reduce IV infusion volume.  Reduce sedative/centrally acting RX.  Repeat SBT.  Continue Diuresis.  ACINETOBACTER CALCOACETICUS/BAUMANNII COMPLEX PNEUMONIA -Monitor fever curve -Trend WBCs -Follow cultures as above -To complete course of Unasyn  Septic Shock>>resolved PMHx of Hypertension -Continuous cardiac monitoring -Maintain MAP greater than 65 -Vasopressors weaned off -Patient now hypertensive, discontinued midodrine  Acute toxic metabolic encephalopathy,  need for sedation -Maintain RASS goal of 0 to -1 -Dilaudid and Precedex drips as needed -Weaning off Dilaudid -Continue to wean off sedatives -Avoid sedating medications as able -Add scheduled oxycodone, Seroquel -Discontinued Valium, Klonopin 1 mg twice daily -Provide supportive care, encourage family presence -Promote normal sleep/wake cycle -PT/OT consults    Best practice (right click and "Reselect all SmartList Selections" daily)  Diet:  Tube Feed  Pain/Anxiety/Delirium protocol (if indicated): Yes (RASS goal 0 to -1 ) VAP protocol (if indicated): Yes DVT prophylaxis: Subcutaneous Heparin GI prophylaxis: PPI Glucose control:  SSI Yes Central venous access:  Yes, and it is still needed Arterial line:  N/A Foley:  N/A Mobility:  bed rest  PT consulted: yes Code Status:  DNR Disposition: ICU    Labs   CBC: Recent Labs  Lab 03/11/21 0549 03/12/21 0539 03/13/21 0512 03/14/21 0446 03/15/21 0507  WBC 8.9 8.0 9.8 10.1 9.6  NEUTROABS  --   --   --  7.9* 7.8*  HGB 11.6* 10.8* 12.5* 12.1* 12.1*  HCT 36.9* 33.4* 38.7* 39.0 38.1*  MCV 105.1* 102.5* 101.3* 103.4* 103.0*  PLT 153 130* 163 179 209     Basic Metabolic Panel: Recent Labs  Lab 03/11/21 0549 03/12/21 0539 03/13/21 0512 03/14/21 0446 03/15/21 0507  NA 152* 151* 151* 152* 154*  K 3.9 3.0* 3.4* 3.4* 3.9  CL 109 112* 107 109 114*  CO2 37* 34* 34* 35* 33*  GLUCOSE 154* 101* 105* 154* 125*  BUN 38* 34* 46* 55* 37*  CREATININE 1.15 0.93 1.23 1.56* 1.29*  CALCIUM 9.1 8.1* 9.6 9.2 9.3  MG 2.1 1.7 2.3 2.5* 2.4  PHOS 4.1 2.5 3.6 4.2 3.5    GFR: Estimated Creatinine Clearance: 85.9 mL/min (A) (by C-G formula based on SCr of 1.29 mg/dL (H)). Recent Labs  Lab 03/12/21 0539 03/13/21 0512 03/14/21 0446 03/15/21 0507  WBC 8.0 9.8 10.1 9.6     Liver Function Tests: No results for input(s): AST, ALT, ALKPHOS, BILITOT, PROT, ALBUMIN in the last 168 hours. No results for input(s): LIPASE, AMYLASE in the  last 168 hours. No results for input(s): AMMONIA in the last 168 hours.  ABG    Component Value Date/Time   PHART 7.40 03/02/2021 2000   PCO2ART 69 (HH) 03/02/2021 2000   PO2ART 137 (H) 03/02/2021 2000   HCO3 42.7 (H) 03/02/2021 2000   O2SAT 99.1 03/02/2021 2000      Coagulation Profile: No results for input(s): INR, PROTIME in the last 168 hours.  Cardiac Enzymes: No results for input(s): CKTOTAL, CKMB, CKMBINDEX, TROPONINI in the last 168 hours.  HbA1C: Hgb A1c MFr Bld  Date/Time Value Ref Range Status  03/02/2021 04:52 AM 5.9 (H) 4.8 - 5.6 % Final    Comment:    (NOTE)         Prediabetes: 5.7 - 6.4         Diabetes: >6.4  Glycemic control for adults with diabetes: <7.0     CBG: Recent Labs  Lab 03/14/21 1519 03/14/21 1944 03/14/21 2357 03/15/21 0330 03/15/21 0712  GLUCAP 102* 122* 129* 96 104*     Allergies Allergies  Allergen Reactions   Divalproex Sodium Other (See Comments)    Elevated liver enzymes   Valproic Acid     unknown     The patient is critically ill with multiple organ systems failure and requires high complexity decision making for assessment and support, frequent evaluation and titration of therapies, application of advanced monitoring technologies and extensive interpretation of multiple databases. Critical Care Time devoted to patient care services described in this note is 35 minutes.      Ottie Glazier, M.D.  [facility] [place]     *This note was dictated using voice recognition software/Dragon.  Despite best efforts to proofread, errors can occur which can change the meaning.  Any change was purely unintentional.

## 2021-03-15 NOTE — Progress Notes (Signed)
Nutrition Follow-up  DOCUMENTATION CODES:   Morbid obesity  INTERVENTION:   If NGT replaced, recommend:  Vital 1.5'@55ml' /hr + ProSource TF 78m QID  Free water flushes 1564mq4 hours   Regimen provides 2300kcal/day, 177g/day protein and 190837may free water   NUTRITION DIAGNOSIS:   Increased nutrient needs related to acute illness as evidenced by estimated needs.  GOAL:   Provide needs based on ASPEN/SCCM guidelines -previously met with tube feeds   MONITOR:   Diet advancement, Labs, Weight trends, Skin, I & O's, Vent status  ASSESSMENT:   [age]o male with a h/o HTN, HLD, OSA, CHF, MDD, GERD, cerebral aneurysm and pulmonary HTN who is admitted with CAP, COPD exacerbation and encephalopathy.   Pt remains ventilated via trach. NGT removed by patient. SLP evaluation pending. ENT consult pending to evaluate trach. Pt with hypernatremia. Per chart, pt is up ~31lbs since admit. Pt +12.6L on his I & O's. Plan is for inpatient rehab at discharge. Pt will need NGT replaced if unable to be initiated on an oral diet.   Medications reviewed and include: lovenox, insulin, protonix, miralax, senokot, precedex  Labs reviewed: Na 154(H), K 3.9 wnl, BUN 37(H), creat 1.29(H), P 3.5 wnl, Mg 2.4 wnl  Patient is currently intubated on ventilator support MV: 9.6 L/min Temp (24hrs), Avg:99.3 F (37.4 C), Min:99 F (37.2 C), Max:99.7 F (37.6 C)  Propofol: none   MAP- >66m8m UOP- 2350ml73met Order:   Diet Order             Diet NPO time specified  Diet effective now                  EDUCATION NEEDS:   No education needs have been identified at this time  Skin:  Skin Assessment: Reviewed RN Assessment  Last BM:  6/16  Height:   Ht Readings from Last 1 Encounters:  03/13/21 '5\' 10"'  (1.778 m)    Weight:   Wt Readings from Last 1 Encounters:  03/15/21 (!) 142.9 kg    Ideal Body Weight:  75.5 kg  BMI:  Body mass index is 45.2 kg/m.  Estimated Nutritional  Needs:   Kcal:  2000-2300kcal/day  Protein:  151-189 g/d (2-2.5g/kg IBW)  Fluid:  2.3-2.6L/day  CaseyKoleen DistanceRD, LDN Please refer to [facility] and/or RD on-call/weekend/after hours pager

## 2021-03-16 ENCOUNTER — Inpatient Hospital Stay: Payer: PPO

## 2021-03-16 LAB — CBC WITH DIFFERENTIAL/PLATELET
Abs Immature Granulocytes: 0.07 10*3/uL (ref 0.00–0.07)
Basophils Absolute: 0 10*3/uL (ref 0.0–0.1)
Basophils Relative: 0 %
Eosinophils Absolute: 0 10*3/uL (ref 0.0–0.5)
Eosinophils Relative: 0 %
HCT: 41.4 % (ref 39.0–52.0)
Hemoglobin: 13 g/dL (ref 13.0–17.0)
Immature Granulocytes: 1 %
Lymphocytes Relative: 9 %
Lymphs Abs: 0.9 10*3/uL (ref 0.7–4.0)
MCH: 32.8 pg (ref 26.0–34.0)
MCHC: 31.4 g/dL (ref 30.0–36.0)
MCV: 104.5 fL — ABNORMAL HIGH (ref 80.0–100.0)
Monocytes Absolute: 1 10*3/uL (ref 0.1–1.0)
Monocytes Relative: 10 %
Neutro Abs: 7.9 10*3/uL — ABNORMAL HIGH (ref 1.7–7.7)
Neutrophils Relative %: 80 %
Platelets: 206 10*3/uL (ref 150–400)
RBC: 3.96 MIL/uL — ABNORMAL LOW (ref 4.22–5.81)
RDW: 12.9 % (ref 11.5–15.5)
WBC: 9.9 10*3/uL (ref 4.0–10.5)
nRBC: 0 % (ref 0.0–0.2)

## 2021-03-16 LAB — GLUCOSE, CAPILLARY
Glucose-Capillary: 107 mg/dL — ABNORMAL HIGH (ref 70–99)
Glucose-Capillary: 97 mg/dL (ref 70–99)
Glucose-Capillary: 102 mg/dL — ABNORMAL HIGH (ref 70–99)
Glucose-Capillary: 112 mg/dL — ABNORMAL HIGH (ref 70–99)
Glucose-Capillary: 133 mg/dL — ABNORMAL HIGH (ref 70–99)

## 2021-03-16 LAB — MAGNESIUM: Magnesium: 2.3 mg/dL (ref 1.7–2.4)

## 2021-03-16 LAB — PHOSPHORUS: Phosphorus: 3.4 mg/dL (ref 2.5–4.6)

## 2021-03-16 MED ORDER — CLONIDINE HCL 0.1 MG PO TABS
0.2000 mg | ORAL_TABLET | Freq: Three times a day (TID) | ORAL | Status: AC
  Administered 2021-03-17 – 2021-03-18 (×3): 0.2 mg
  Filled 2021-03-16 (×3): qty 2

## 2021-03-16 MED ORDER — FREE WATER
150.0000 mL | Status: DC
  Administered 2021-03-16 – 2021-03-17 (×5): 150 mL

## 2021-03-16 MED ORDER — PROSOURCE TF PO LIQD
90.0000 mL | Freq: Four times a day (QID) | ORAL | Status: DC
  Administered 2021-03-16 – 2021-03-19 (×11): 90 mL
  Filled 2021-03-16 (×15): qty 90

## 2021-03-16 MED ORDER — CLONIDINE HCL 0.1 MG PO TABS
0.2000 mg | ORAL_TABLET | Freq: Two times a day (BID) | ORAL | Status: AC
  Administered 2021-03-18 – 2021-03-19 (×2): 0.2 mg
  Filled 2021-03-16 (×2): qty 2

## 2021-03-16 MED ORDER — CLONIDINE HCL 0.1 MG PO TABS
0.2000 mg | ORAL_TABLET | Freq: Four times a day (QID) | ORAL | Status: AC
  Administered 2021-03-16 – 2021-03-17 (×4): 0.2 mg
  Filled 2021-03-16 (×4): qty 2

## 2021-03-16 MED ORDER — CLONIDINE HCL 0.1 MG PO TABS
0.2000 mg | ORAL_TABLET | ORAL | Status: AC
  Administered 2021-03-19: 0.2 mg
  Filled 2021-03-16: qty 2

## 2021-03-16 MED ORDER — VITAL 1.5 CAL PO LIQD
1000.0000 mL | ORAL | Status: DC
  Administered 2021-03-16 – 2021-03-18 (×2): 1000 mL

## 2021-03-16 NOTE — Progress Notes (Signed)
Occupational Therapy Treatment Patient Details Name: Eddie Chapman MRN: [id number] DOB: 06/01/1961 Today's Date: 03/16/2021    History of present illness Eddie Chapman is a 60 y.o. with a complex medical history including systolic CHF chronically, pulmonary hypertension, obstructive sleep apnea, chronic hypoxemia with respiratory failure, obesity hypoventilation syndrome with thoracic restriction, hormonal dysfunction with hypotestosteronism, dyslipidemia, metabolic syndrome, previous subarachnoid hemorrhage, history of traumatic thoracic spinal injury, & depression. He presented to [facility] ED on 02/23/21 with c/o of confusion & SOB for at least 3 days. Pt was admitted to CCU for ARF. he failed extubation and had trach placed on 03/07/21.   OT comments  Pt seen for OT and PT co-tx for pt safety and functional transfer training. Pt eager to participate, able to hold onto clip board and pen to write (legibility impaired, but when he slows down and writes in larger capital letters, is more legible). Pt writes "alls good" when asked how he was doing. +2MIN A for bed mobility and ADL transfers with bilat knees blocked. Able to side step x2 EOB for improved positioning prior to return to bed, able to reposition himself with little assist. Session continued after PT focused on grooming. Pt only requiring setup and supervision for washing face, hands, and placing cool damp washcloth behind his neck for comfort. Much improved since evaluation when pt was unable to hold a washcloth himself and required assist to grasp and reach his face. Pt progressing well towards goals, continues to benefit from high intensity skilled OT services. Continue to recommend CIR as most appropriate discharge recommendation.    Follow Up Recommendations  CIR    Equipment Recommendations  Other (comment) (defer to next venue, anticipate need for bari [facility])    Recommendations for Other Services      Precautions / Restrictions  Precautions Precautions: Fall Precaution Comments: ventilated with trach Restrictions Weight Bearing Restrictions: No       Mobility Bed Mobility Overal bed mobility: Needs Assistance Bed Mobility: Supine to Sit;Sit to Supine Rolling: +2 for physical assistance;Mod assist   Supine to sit: +2 for physical assistance;HOB elevated;+2 for safety/equipment;Min assist Sit to supine: +2 for physical assistance;HOB elevated;+2 for safety/equipment;Min assist   General bed mobility comments: use of bed rails, prompted for technique, activity pacing    Transfers Overall transfer level: Needs assistance Equipment used: 2 person hand held assist Transfers: Sit to/from Stand Sit to Stand: Min assist;+2 physical assistance         General transfer comment: bilateral knee block throughout    Balance Overall balance assessment: Needs assistance Sitting-balance support: Feet supported;Bilateral upper extremity supported Sitting balance-Leahy Scale: Good Sitting balance - Comments: CGA- stand by assist for sitting balance, decreased impulsivity noted though monitored closely   Standing balance support: Bilateral upper extremity supported Standing balance-Leahy Scale: Zero Standing balance comment: bilateral knees blocked                           ADL either performed or assessed with clinical judgement   ADL Overall ADL's : Needs assistance/impaired Eating/Feeding: Supervision/ safety Eating/Feeding Details (indicate cue type and reason): sitting position in bed, pt able to scoop small amount of ice chips w/ spoon and bring to mouth with minimal spillage; RN then requested no additional ice Grooming: Wash/dry hands;Bed level;Oral care;Set up;Supervision/safety;Wash/dry face;Brushing hair Grooming Details (indicate cue type and reason): sitting  position in bed, set up for items, able to manipulate washcloth to fold it  and place on forehead, and behind neck for comfort                                      Vision Patient Visual Report: No change from baseline     Perception     Praxis      Cognition Arousal/Alertness: Awake/alert Behavior During Therapy: WFL for tasks assessed/performed Overall Cognitive Status: Impaired/Different from baseline Area of Impairment: Attention;Following commands;Safety/judgement                       Following Commands: Follows one step commands consistently Safety/Judgement: Decreased awareness of safety;Decreased awareness of deficits Awareness: Anticipatory;Emergent Problem Solving: Requires verbal cues;Requires tactile cues General Comments: improved communication noted, pt able to write on paper with clip board,cued for activity pacing and listened well; some education required around safety and inability to drink right now        Exercises Other Exercises Other Exercises: Bed placed in chair position at end of session, OT at bedside Other Exercises: grooming with set up assist, cues for safety when taking a couple small scoops of ice chips (were on table next to pt, RN and SLP later clarified pt should only have after oral care at this point, ice chips removed)   Shoulder Instructions       General Comments      Pertinent Vitals/ Pain       Pain Assessment: Faces Faces Pain Scale: Hurts a little bit Pain Location: intermittently with mobility, pt able to mouth words, did endorse some low back pain Pain Descriptors / Indicators: Grimacing;Guarding Pain Intervention(s): Limited activity within patient's tolerance;Monitored during session;Repositioned  Home Living                                          Prior Functioning/Environment              Frequency  Min 3X/week        Progress Toward Goals  OT Goals(current goals can now be found in the care plan section)  Progress towards OT goals: Progressing toward goals  Acute Rehab OT Goals Patient Stated  Goal: get better OT Goal Formulation: With patient/family Time For Goal Achievement: 03/23/21 Potential to Achieve Goals: Good  Plan Discharge plan remains appropriate;Frequency remains appropriate    Co-evaluation    PT/OT/SLP Co-Evaluation/Treatment: Yes Reason for Co-Treatment: Complexity of the patient's impairments (multi-system involvement);For patient/therapist safety;To address functional/ADL transfers PT goals addressed during session: Mobility/safety with mobility;Balance OT goals addressed during session: ADL's and self-care;Strengthening/ROM      AM-PAC OT "6 Clicks" Daily Activity     Outcome Measure   Help from another person eating meals?: A Lot Help from another person taking care of personal grooming?: A Little Help from another person toileting, which includes using toliet, bedpan, or urinal?: Total Help from another person bathing (including washing, rinsing, drying)?: A Lot Help from another person to put on and taking off regular upper body clothing?: A Lot Help from another person to put on and taking off regular lower body clothing?: Total 6 Click Score: 11    End of Session Equipment Utilized During Treatment: Rolling walker  OT Visit Diagnosis: Other abnormalities of gait and mobility (R26.89);Muscle weakness (generalized) (M62.81);Other symptoms and signs involving cognitive function  Activity Tolerance Patient tolerated treatment well   Patient Left in bed;with call bell/phone within reach;with bed alarm set   Nurse Communication Mobility status        Time: 1350-1451 OT Time Calculation (min): 61 min  Charges: OT General Charges $OT Visit: 1 Visit OT Treatments $Self Care/Home Management : 23-37 mins  Hanley Hays, MPH, MS, OTR/L ascom [phone] 03/16/21, 4:09 PM

## 2021-03-16 NOTE — Progress Notes (Signed)
NAME:  Eddie Chapman, MRN:  [id number], DOB:  22-Oct-1960, LOS: 21 ADMISSION DATE:  02/23/2021  Brief Pt Description / Synopsis:  60 yo morbidly obese male with acute on chronic hypoxic and hypercapnic respiratory failure due to [place] and AECOPD, along with severe toxic metabolic encephalopathy.  Failure to wean from vent requiring Tracheostomy on 03/07/2021.  History of Present Illness:   Patient has a complex comorbid history including systolic CHF chronically, pulmonary hypertension, obstructive sleep apnea, chronic hypoxemia with respiratory failure, obesity hypoventilation syndrome with thoracic restriction, hormonal dysfunction with hypotestosteronism, dyslipidemia, metabolic syndrome, previous subarachnoid hemorrhage, history of traumatic thoracic spinal injury, depression.  Patient was admitted in January to the intensive care unit with altered mental status acute hypoxemic and hypercapnic respiratory failure with metabolic encephalopathy and left lower lobe severe pneumonia.  Throughout hospitalization he was noted to have lethargy with hypercapnic encephalopathy and became unresponsive a few times even postextubation while on medical floor.  Patient has never smoked in the past.   On this admission he is poorly responsive with hypercapnic hypoxemic respiratory failure          Events:      02/24/21- Patient responded well to mechanical ventilation with improvement in hypercapnia on ABG this am.  Seems that he had THC induced apnea/hypopnea based on laboratory testing.  There is bibasilar atelectasis induced severe hypoxemia, recruitment Metaneb therapy has been ordered. Plan to repeat CXR this afternoon with SBT today.  Reviewed plan with RN and RT today.     02/25/21- patient was unable to pass SBT today. He was able to come off propofol with RASS-0 and is weaned down on levophed.     02/26/21- patient continues to require levophed, he has been difficult  to wean. I have asked ID for consultation due to concern for sepsis possibly due to CAP. Unable to perform SBT today. Family at bedside.   02/27/21- patient on PRVC with heavy secretions, difficulty weaning with high narcotic tolerance. Patient had suicide attempt with requirement for psych.   6/1 failing to wean from vent, DNR status 6/2 failed to wean from vent, severe hypoxia 6/3 severe hypoxia, failure to wean from vent, needs [facility], ENT consulted 6/4 failure to wean from  6/6 failure to wean from vent, plan for [facility] 6/7 failure to wean from vent 6/8 plan for [facility] TODAY 6/9 left foot cyanosis, vasc surgery consulted 6/10: Agitation overnight, added oxycodone, valium, seroquel.  SBT in progress.  Working with PT 6/11: Less agitated, however, did not tolerate SBT today 6/12: Dilaudid being weaned off, less agitated.  Can actually speak with trach.  Tolerating SBT 03/12/21- patient febrile, Trache aspirate sent for viral workup. Patient with anasarca on examination. Adequate UOP.  On PRVC 35%, GCS3T.  03/13/21- TCC having difficulty finding CIR placement.  There is RLE swelling and hypothermia and DVT study was ordered, he is with severe anxiety during SBT today and we reviewed MAR with addition of Abilify due to no QTC effect.   03/14/21- patient is awake, he is attempting again for trache collar trial today.  His secretions are improved, he is vocalizing and trying to write for communication.  03/15/21- patient is alert he communicates via gesturing and attempts to vocalize.  Still in withdrawal and on precedex but calm and appropriate.  Plan is to wean precedex and get to PT/OT.  03/16/21- patient is improved mentation, plan for PMV and SLP with ongoing PNMR transfer for recovery.  He will need  psych eval at some point due to suicide attempt   Micro Data:  5/27: SARS-CoV-2 and influenza PCR>> negative 5/27: Blood culture x2>> no growth 5/27: Urine>> no growth 5/27: MRSA PCR>>  negative 5/28: Sputum>> normal respiratory flora 5/30: Respiratory viral panel>> negative 5/31: SPUTUM CULTURE >> ACINETOBACTER CALCOACETICUS/BAUMANNII COMPLEX     Antimicrobials:  Azithromycin 5/27>>5/29 Ceftriaxone 5/27>>5/29 Vancomycin 5/29>>5/30 Zosyn 5/29>>6/1 Unasyn 6/1>>  Antibiotics Given (last 72 hours)     None      Scheduled Meds:  ARIPiprazole  5 mg Per Tube Daily   chlorhexidine gluconate (MEDLINE KIT)  15 mL Mouth Rinse BID   Chlorhexidine Gluconate Cloth  6 each Topical Daily   cloNIDine  0.2 mg Per Tube TID   Followed by   cloNIDine  0.2 mg Per Tube Q12H   Followed by   [START ON 03/17/2021] cloNIDine  0.2 mg Per Tube Q24H   enoxaparin (LOVENOX) injection  0.5 mg/kg Subcutaneous Q24H   insulin aspart  0-15 Units Subcutaneous Q4H   mouth rinse  15 mL Mouth Rinse 10 times per day   pantoprazole sodium  40 mg Per Tube Daily   polyethylene glycol  17 g Per Tube Daily   senna-docusate  1 tablet Per Tube BID   sodium chloride flush  10-40 mL Intracatheter Q12H   Continuous Infusions:  sodium chloride Stopped (03/11/21 1855)   dexmedetomidine (PRECEDEX) IV infusion 0.6 mcg/kg/hr (03/16/21 0842)   PRN Meds:.bisacodyl, clonazepam, docusate sodium, glycopyrrolate, hydrALAZINE, midazolam, oxyCODONE, polyethylene glycol, polyvinyl alcohol, sodium chloride flush, traZODone    Objective   Blood pressure (!) 163/105, pulse 83, temperature 99.7 F (37.6 C), temperature source Oral, resp. rate (!) 21, height _0  (1.778 m), weight (!) 144.3 kg, SpO2 100 %.    Vent Mode: PRVC FiO2 (%):  [35 %] 35 % Set Rate:  [15 bmp] 15 bmp Vt Set:  [550 mL] 550 mL PEEP:  [5 cmH20] 5 cmH20 Pressure Support:  [15 cmH20] 15 cmH20 Plateau Pressure:  [18 cmH20] 18 cmH20   Intake/Output Summary (Last 24 hours) at 03/16/2021 0853 Last data filed at 03/16/2021 0600 Gross per 24 hour  Intake 532.28 ml  Output 3100 ml  Net -2567.72 ml    Filed Weights   03/13/21 0500 03/15/21  0500 03/16/21 0359  Weight: (!) 147.3 kg (!) 142.9 kg (!) 144.3 kg      REVIEW OF SYSTEMS PATIENT IS UNABLE TO PROVIDE COMPLETE REVIEW OF SYSTEMS DUE TO SEVERE CRITICAL ILLNESS AND  ENCEPHALOPATHY   PHYSICAL EXAMINATION:  GENERAL: Morbidly obese, chronically ill-appearing male, laying in bed, undergoing spontaneous breathing trial, no acute distress NECK: Supple.  No, JVD, tracheostomy midline PULMONARY: Mechanical breath sounds, no tachypnea, even CARDIOVASCULAR: S1 and S2. Regular rate and rhythm. No murmurs, rubs, or gallops.  GASTROINTESTINAL: Massively obese, soft, nontender, nondistended, no guarding or rebound tenderness, bowel sounds positive x4 MUSCULOSKELETAL: 2+ edema NEUROLOGIC: awake alert with mild encephalopathy no FND grossly  SKIN: Warm and dry.  No obvious rashes, lesions, ulcerations     Assessment / Plan:    Acute on Chronic Hypoxic and Hypercapnic Respiratory Failure in the setting of [place] and AECOPD -Unable to wean from vent ~ S/P tracheostomy on 03/07/21 -Full vent support, implement lung protective strategies -Wean FiO2 and PEEP as tolerated to maintain O2 sats 88 to 92% -Follow intermittent chest x-ray and ABG as needed -Spontaneous breathing trial/trach collar trials as tolerated, tolerating today -Bronchodilators and budesonide nebs -Completed antibiotics  Short term plan for next 24h: >> reduce IV infusion volume.  Reduce sedative/centrally acting RX.  Repeat SBT.  Continue Diuresis.  ACINETOBACTER CALCOACETICUS/BAUMANNII COMPLEX PNEUMONIA -Monitor fever curve -Trend WBCs -Follow cultures as above -To complete course of Unasyn     Suicide attmept    - wife states patient was holding gun in his hand while in backyard and was preparing to "blow his brains out"    -will order consultation with psychiatry  Septic Shock>>resolved PMHx of Hypertension -Continuous cardiac monitoring -Maintain MAP greater than  65 -Vasopressors weaned off -Patient now hypertensive, discontinued midodrine  Acute toxic metabolic encephalopathy, need for sedation -Maintain RASS goal of 0 to -1 -Dilaudid and Precedex drips as needed -Weaning off Dilaudid -Continue to wean off sedatives -Avoid sedating medications as able -Add scheduled oxycodone, Seroquel -Discontinued Valium, Klonopin 1 mg twice daily -Provide supportive care, encourage family presence -Promote normal sleep/wake cycle -PT/OT consults    Best practice (right click and "Reselect all SmartList Selections" daily)  Diet:  Tube Feed  Pain/Anxiety/Delirium protocol (if indicated): Yes (RASS goal 0 to -1 ) VAP protocol (if indicated): Yes DVT prophylaxis: Subcutaneous Heparin GI prophylaxis: PPI Glucose control:  SSI Yes Central venous access:  Yes, and it is still needed Arterial line:  N/A Foley:  N/A Mobility:  bed rest  PT consulted: yes Code Status:  DNR Disposition: ICU    Labs   CBC: Recent Labs  Lab 03/12/21 0539 03/13/21 0512 03/14/21 0446 03/15/21 0507 03/16/21 0524  WBC 8.0 9.8 10.1 9.6 9.9  NEUTROABS  --   --  7.9* 7.8* 7.9*  HGB 10.8* 12.5* 12.1* 12.1* 13.0  HCT 33.4* 38.7* 39.0 38.1* 41.4  MCV 102.5* 101.3* 103.4* 103.0* 104.5*  PLT 130* 163 179 209 206     Basic Metabolic Panel: Recent Labs  Lab 03/11/21 0549 03/12/21 0539 03/13/21 0512 03/14/21 0446 03/15/21 0507 03/16/21 0524  NA 152* 151* 151* 152* 154*  --   K 3.9 3.0* 3.4* 3.4* 3.9  --   CL 109 112* 107 109 114*  --   CO2 37* 34* 34* 35* 33*  --   GLUCOSE 154* 101* 105* 154* 125*  --   BUN 38* 34* 46* 55* 37*  --   CREATININE 1.15 0.93 1.23 1.56* 1.29*  --   CALCIUM 9.1 8.1* 9.6 9.2 9.3  --   MG 2.1 1.7 2.3 2.5* 2.4 2.3  PHOS 4.1 2.5 3.6 4.2 3.5 3.4    GFR: Estimated Creatinine Clearance: 86.3 mL/min (A) (by C-G formula based on SCr of 1.29 mg/dL (H)). Recent Labs  Lab 03/13/21 0512 03/14/21 0446 03/15/21 0507 03/16/21 0524  WBC 9.8  10.1 9.6 9.9     Liver Function Tests: No results for input(s): AST, ALT, ALKPHOS, BILITOT, PROT, ALBUMIN in the last 168 hours. No results for input(s): LIPASE, AMYLASE in the last 168 hours. No results for input(s): AMMONIA in the last 168 hours.  ABG    Component Value Date/Time   PHART 7.40 03/02/2021 2000   PCO2ART 69 (HH) 03/02/2021 2000   PO2ART 137 (H) 03/02/2021 2000   HCO3 42.7 (H) 03/02/2021 2000   O2SAT 99.1 03/02/2021 2000      Coagulation Profile: No results for input(s): INR, PROTIME in the last 168 hours.  Cardiac Enzymes: No results for input(s): CKTOTAL, CKMB, CKMBINDEX, TROPONINI in the last 168 hours.  HbA1C: Hgb A1c MFr Bld  Date/Time Value Ref Range Status  03/02/2021 04:52 AM 5.9 (H) 4.8 -  5.6 % Final    Comment:    (NOTE)         Prediabetes: 5.7 - 6.4         Diabetes: >6.4         Glycemic control for adults with diabetes: <7.0     CBG: Recent Labs  Lab 03/15/21 1519 03/15/21 1925 03/15/21 2342 03/16/21 0321 03/16/21 0721  GLUCAP 118* 103* 110* 107* 97     Allergies Allergies  Allergen Reactions   Divalproex Sodium Other (See Comments)    Elevated liver enzymes   Valproic Acid     unknown     The patient is critically ill with multiple organ systems failure and requires high complexity decision making for assessment and support, frequent evaluation and titration of therapies, application of advanced monitoring technologies and extensive interpretation of multiple databases. Critical Care Time devoted to patient care services described in this note is 35 minutes.      Ottie Glazier, M.D.  [facility] [place]     *This note was dictated using voice recognition software/Dragon.  Despite best efforts to proofread, errors can occur which can change the meaning.  Any change was purely unintentional.

## 2021-03-16 NOTE — Consult Note (Signed)
Eddie Chapman, Eddie Chapman [id number] [birth year] Eddie Glazier, MD  Reason for Consult: Change trach tube  HPI: The patient is a 60 year old morbidly obese white male with acute on chronic hypoxic and hypercapnic respiratory failure due to pneumonia and acute exacerbation of COPD, along with severe toxic metabolic encephalopathy.  He failed to wean and had a tracheostomy on 03/07/2021.  He is now more alert and has been communicating with hand signals and mouthing words and writing on a pad.  He is starting to wean from the vent some.  He is seen today for changing of his trach tube to a smaller size.  Allergies:  Allergies  Allergen Reactions   Divalproex Sodium Other (See Comments)    Elevated liver enzymes   Valproic Acid     unknown    ROS: Review of systems normal other than 12 systems except per HPI.  PMH:  Past Medical History:  Diagnosis Date   Adenomatous colon polyp    Depression    Ear drum perforation    left x2    GERD (gastroesophageal reflux disease)    Hyperlipidemia    Hypertension    T8 vertebral fracture (HCC)     FH:  Family History  Problem Relation Age of Onset   Cancer Mother    Varicose Veins Mother     SH:  Social History   Socioeconomic History   Marital status: Single    Spouse name: Not on file   Number of children: Not on file   Years of education: Not on file   Highest education level: Not on file  Occupational History   Not on file  Tobacco Use   Smoking status: Never   Smokeless tobacco: Never  Vaping Use   Vaping Use: Never used  Substance and Sexual Activity   Alcohol use: Never   Drug use: Never   Sexual activity: Not Currently    Partners: Female    Birth control/protection: None  Other Topics Concern   Not on file  Social History Narrative   Not on file   Social Determinants of Health   Financial Resource Strain: Not on file  Food Insecurity: Not on file  Transportation Needs: Not on file  Physical Activity: Not on file   Stress: Not on file  Social Connections: Not on file  Intimate Partner Violence: Not on file    PSH:  Past Surgical History:  Procedure Laterality Date   CARPAL TUNNEL RELEASE     left hand   COLONOSCOPY  09/18/2009, 09/15/2014   COLONOSCOPY WITH PROPOFOL N/A 12/29/2017   Procedure: COLONOSCOPY WITH PROPOFOL;  Surgeon: Manya Silvas, MD;  Location: [facility] ENDOSCOPY;  Service: Endoscopy;  Laterality: N/A;   EYE SURGERY     FRACTURE SURGERY     HERNIA REPAIR     LIPOMA EXCISION     SPINE SURGERY     TRACHEOSTOMY TUBE PLACEMENT N/A 03/07/2021   Procedure: TRACHEOSTOMY;  Surgeon: Margaretha Sheffield, MD;  Location: [facility] ORS;  Service: ENT;  Laterality: N/A;    Physical  Exam: Patient is awake and understands what were doing.  He has a very full thick neck but there is no nodes or masses.  He has a 7 Shiley XLT proximal length tube in place.  The trach site is healing very well.  A fresh 6 Shiley XLT proximal length tube was readied with cuff checked, deflated, and lubricated.  The trach tube was changed out without difficulty and is now breathing well with that  in place.  There is no bleeding at the stoma site.  His oxygenation remains good.  It is secured around his neck.   A/P: The patient has chronic lung issues and has been vent dependent since admission to the hospital.  He is now more alert and awake and working towards weaning.  He has smaller trach tube in and hopefully can now begin trials of speaking valve.  Once he tolerates speaking valve well he can then have a swallowing study done where hopefully he can protect his larynx better.  Because of his sleep apnea it may be a while before he gets his trach tube out.  If he does undergoing capping trials he will need to be on BiPAP at night to make sure he tolerates this well.   Eddie Chapman 03/16/2021 7:59 AM

## 2021-03-16 NOTE — Progress Notes (Signed)
Physical Therapy Treatment Patient Details Name: Eddie Chapman MRN: [id number] DOB: 17-May-1961 Today's Date: 03/16/2021    History of Present Illness Eddie Chapman is a 60 y.o. with a complex medical history including systolic CHF chronically, pulmonary hypertension, obstructive sleep apnea, chronic hypoxemia with respiratory failure, obesity hypoventilation syndrome with thoracic restriction, hormonal dysfunction with hypotestosteronism, dyslipidemia, metabolic syndrome, previous subarachnoid hemorrhage, history of traumatic thoracic spinal injury, & depression. He presented to [facility] ED on 02/23/21 with c/o of confusion & SOB for at least 3 days. Pt was admitted to CCU for ARF. he failed extubation and had trach placed on 03/07/21.    PT Comments    Patient alert, in bed with visitors. Pt seen with OT for co treat to maximize safety and function. Pt endorsed some back pain at start of session, FACES scale mild-moderate signs with mobility.   The patient demonstrated great progress towards goals. Pt followed one step commands consistently, responsive to instructions for technique, activity pacing, and safety cueing. Supine <> sit with minAx2. Sit <> stand twice with minAx2, bilateral knee blocks, third assist for handheld assist. Each standing attempt pt able to take one step laterally, PT assist for LE movement. Returned to supine, and bed placed in chair position. The patient remains an excellent candidate for CIR, recommendation is appropriate.      Follow Up Recommendations  CIR     Equipment Recommendations  Other (comment) (TBD at next venue of care)    Recommendations for Other Services       Precautions / Restrictions Precautions Precautions: Fall Precaution Comments: ventilated with trach Restrictions Weight Bearing Restrictions: No    Mobility  Bed Mobility Overal bed mobility: Needs Assistance Bed Mobility: Supine to Sit;Sit to Supine     Supine to sit: +2 for physical  assistance;HOB elevated;+2 for safety/equipment;Min assist Sit to supine: +2 for physical assistance;HOB elevated;+2 for safety/equipment;Min assist   General bed mobility comments: use of bed rails, prompted for technique, activity pacing    Transfers Overall transfer level: Needs assistance Equipment used: 2 person hand held assist Transfers: Sit to/from Stand Sit to Stand: Min assist;+2 physical assistance         General transfer comment: bilateral knee block throughout  Ambulation/Gait   Gait Distance (Feet):  (20ft + 67ft) Assistive device: 2 person hand held assist       General Gait Details: handheld assist two person. second attempt pt able to hold onto bari walker with one hand and PT assisted with RLE step   Stairs             Wheelchair Mobility    Modified Rankin (Stroke Patients Only)       Balance Overall balance assessment: Needs assistance Sitting-balance support: Feet supported;Bilateral upper extremity supported Sitting balance-Leahy Scale: Good Sitting balance - Comments: CGA- stand by assist for sitting balance, decreased impulsivity noted though monitored closely     Standing balance-Leahy Scale: Zero Standing balance comment: bilateral knees blocked                            Cognition Arousal/Alertness: Awake/alert Behavior During Therapy: WFL for tasks assessed/performed Overall Cognitive Status: Difficult to assess Area of Impairment: Attention;Following commands;Safety/judgement                       Following Commands: Follows one step commands consistently Safety/Judgement: Decreased awareness of safety Awareness: Anticipatory;Emergent Problem Solving: Requires verbal  cues;Requires tactile cues General Comments: improved communication noted, pt able to write on paper with clip board,cued for activity pacing and listened well      Exercises Other Exercises Other Exercises: Bed placed in chair position  at end of session, OT at bedside    General Comments        Pertinent Vitals/Pain Pain Assessment: Faces Faces Pain Scale: Hurts a little bit Pain Location: intermittently with mobility, pt able to mouth words, did endorse some low back pain Pain Descriptors / Indicators: Grimacing;Guarding Pain Intervention(s): Limited activity within patient's tolerance;Monitored during session;Repositioned    Home Living                      Prior Function            PT Goals (current goals can now be found in the care plan section) Progress towards PT goals: Progressing toward goals    Frequency    Min 2X/week      PT Plan Current plan remains appropriate    Co-evaluation PT/OT/SLP Co-Evaluation/Treatment: Yes Reason for Co-Treatment: Complexity of the patient's impairments (multi-system involvement);For patient/therapist safety;To address functional/ADL transfers PT goals addressed during session: Mobility/safety with mobility;Balance OT goals addressed during session: ADL's and self-care;Strengthening/ROM      AM-PAC PT "6 Clicks" Mobility   Outcome Measure  Help needed turning from your back to your side while in a flat bed without using bedrails?: Total Help needed moving from lying on your back to sitting on the side of a flat bed without using bedrails?: Total Help needed moving to and from a bed to a chair (including a wheelchair)?: Total Help needed standing up from a chair using your arms (e.g., wheelchair or bedside chair)?: A Lot Help needed to walk in hospital room?: A Lot Help needed climbing 3-5 steps with a railing? : Total 6 Click Score: 8    End of Session Equipment Utilized During Treatment: Oxygen Activity Tolerance: Patient tolerated treatment well Patient left: in bed;with call bell/phone within reach;Other (comment) (OT at bedside) Nurse Communication: Mobility status PT Visit Diagnosis: Muscle weakness (generalized) (M62.81);Difficulty in  walking, not elsewhere classified (R26.2);Unsteadiness on feet (R26.81)     Time: 1350-1430 PT Time Calculation (min) (ACUTE ONLY): 40 min  Charges:  $Therapeutic Exercise: 23-37 mins                     Lieutenant Diego PT, DPT 2:52 PM,03/16/21

## 2021-03-16 NOTE — TOC Progression Note (Signed)
Transition of Care [facility]) - Progression Note    Patient Details  Name: Eddie Chapman MRN: [id number] Date of Birth: 04/05/1961  Transition of Care [facility]) CM/SW [place], [place] Phone Number: [phone] 03/16/2021, 9:33 AM  Clinical Narrative:     CSW spoke with Claiborne Billings at [facility] facility [phone], w/ update on potential placement for patient.  Claiborne Billings stated the patient was not medical stable yet for admission and they would need the patient to have a cuffless trach w/ patient's support system trained on trach care, and a final nutrition plan.  CSW updated Claiborne Billings, the goal was to have the patient attempt to wean off vent and swallow eval.  CSW updated Attending and RD on admissions requirements for [facility].       Expected Discharge Plan and Services                                                 Social Determinants of Health (SDOH) Interventions    Readmission Risk Interventions No flowsheet data found.

## 2021-03-16 NOTE — Progress Notes (Signed)
Physical Therapy Treatment Patient Details Name: Eddie Chapman MRN: [id number] DOB: Dec 04, 1960 Today's Date: 03/16/2021    History of Present Illness Eddie Chapman is a 60 y.o. with a complex medical history including systolic CHF chronically, pulmonary hypertension, obstructive sleep apnea, chronic hypoxemia with respiratory failure, obesity hypoventilation syndrome with thoracic restriction, hormonal dysfunction with hypotestosteronism, dyslipidemia, metabolic syndrome, previous subarachnoid hemorrhage, history of traumatic thoracic spinal injury, & depression. He presented to [facility] ED on 02/23/21 with c/o of confusion & SOB for at least 3 days. Pt was admitted to CCU for ARF. he failed extubation and had trach placed on 03/07/21.    PT Comments    Improved alertness and active participation with session; pointing/gesturing to communicate wants/needs throughout session.  Tolerated all bed-level activities well; improving strength and overall functional endurance/activity tolerance noted.  Frequency updated to 2x/week given gradual progression and weaning from vent; however, will continue to see as frequently as able during the week.  May benefit from communication board (patient trying to write, but noted difficulty) to aid in communication; SLP aware.     Follow Up Recommendations  CIR     Equipment Recommendations       Recommendations for Other Services       Precautions / Restrictions Precautions Precautions: Fall Precaution Comments: ventilated with trach Restrictions Weight Bearing Restrictions: No    Mobility  Bed Mobility               General bed mobility comments: deferred this session    Transfers                 General transfer comment: deferred this session  Ambulation/Gait                 Stairs             Wheelchair Mobility    Modified Rankin (Stroke Patients Only)       Balance                                             Cognition Arousal/Alertness: Awake/alert Behavior During Therapy: WFL for tasks assessed/performed Overall Cognitive Status: Difficult to assess                                 General Comments: impulsive; increased alertness and active engagement with session; pointing/gesturing to communicate wants/needs      Exercises Other Exercises Other Exercises: Positioned in chair position in bed, midline alignment: participated with core, UE and LE strengthening therex as tolerated--bilat LE ankle pumps, LAQs, hip abduct/adduct and ADD/IR; dynamic UE reaching, resisted pushing/pulling with bilat UEs, modified D1/D2 flexion with trunk rotation (bringing trunk off of bed surface as able); modified crunches/sit ups for abdominal engagement.  Required intermittent rest periods with all therex, but participated well; improving active strength and movement in all extremities, improving activity tolerance noted.    General Comments        Pertinent Vitals/Pain Pain Assessment: Faces Pain Score: 0-No pain    Home Living                      Prior Function            PT Goals (current goals can now be found in the care plan section)  Acute Rehab PT Goals Patient Stated Goal: get better PT Goal Formulation: With family Time For Goal Achievement: 03/25/21 Potential to Achieve Goals: Fair Progress towards PT goals: Progressing toward goals    Frequency    Min 2X/week      PT Plan Frequency needs to be updated    Co-evaluation              AM-PAC PT "6 Clicks" Mobility   Outcome Measure  Help needed turning from your back to your side while in a flat bed without using bedrails?: Total Help needed moving from lying on your back to sitting on the side of a flat bed without using bedrails?: Total Help needed moving to and from a bed to a chair (including a wheelchair)?: Total Help needed standing up from a chair using your arms (e.g.,  wheelchair or bedside chair)?: Total Help needed to walk in hospital room?: Total Help needed climbing 3-5 steps with a railing? : Total 6 Click Score: 6    End of Session Equipment Utilized During Treatment: Oxygen Activity Tolerance: Patient tolerated treatment well;Other (comment) Patient left: in bed;with family/visitor present;with call bell/phone within reach;with nursing/sitter in room Nurse Communication: Mobility status PT Visit Diagnosis: Muscle weakness (generalized) (M62.81);Difficulty in walking, not elsewhere classified (R26.2);Unsteadiness on feet (R26.81)     Time: 8527-7824 PT Time Calculation (min) (ACUTE ONLY): 38 min  Charges:  $Therapeutic Exercise: 38-52 mins                     Eddie Chapman, PT, DPT, NCS 03/16/21, 9:43 AM [phone]

## 2021-03-16 NOTE — Progress Notes (Signed)
Patient has become more oriented through out the night, now oriented to month and year. Patient does appear to have some delirium. Patient only sleep for about 1 hour overnight.

## 2021-03-17 LAB — CBC WITH DIFFERENTIAL/PLATELET
Abs Immature Granulocytes: 0.04 10*3/uL (ref 0.00–0.07)
Basophils Absolute: 0 10*3/uL (ref 0.0–0.1)
Basophils Relative: 0 %
Eosinophils Absolute: 0 10*3/uL (ref 0.0–0.5)
Eosinophils Relative: 0 %
HCT: 40.4 % (ref 39.0–52.0)
Hemoglobin: 12.3 g/dL — ABNORMAL LOW (ref 13.0–17.0)
Immature Granulocytes: 1 %
Lymphocytes Relative: 12 %
Lymphs Abs: 0.9 10*3/uL (ref 0.7–4.0)
MCH: 32.2 pg (ref 26.0–34.0)
MCHC: 30.4 g/dL (ref 30.0–36.0)
MCV: 105.8 fL — ABNORMAL HIGH (ref 80.0–100.0)
Monocytes Absolute: 0.9 10*3/uL (ref 0.1–1.0)
Monocytes Relative: 11 %
Neutro Abs: 5.8 10*3/uL (ref 1.7–7.7)
Neutrophils Relative %: 76 %
Platelets: 201 10*3/uL (ref 150–400)
RBC: 3.82 MIL/uL — ABNORMAL LOW (ref 4.22–5.81)
RDW: 13.2 % (ref 11.5–15.5)
WBC: 7.6 10*3/uL (ref 4.0–10.5)
nRBC: 0 % (ref 0.0–0.2)

## 2021-03-17 LAB — GLUCOSE, CAPILLARY
Glucose-Capillary: 115 mg/dL — ABNORMAL HIGH (ref 70–99)
Glucose-Capillary: 121 mg/dL — ABNORMAL HIGH (ref 70–99)
Glucose-Capillary: 124 mg/dL — ABNORMAL HIGH (ref 70–99)
Glucose-Capillary: 124 mg/dL — ABNORMAL HIGH (ref 70–99)
Glucose-Capillary: 159 mg/dL — ABNORMAL HIGH (ref 70–99)
Glucose-Capillary: 95 mg/dL (ref 70–99)
Glucose-Capillary: 98 mg/dL (ref 70–99)

## 2021-03-17 LAB — RENAL FUNCTION PANEL
Albumin: 3.3 g/dL — ABNORMAL LOW (ref 3.5–5.0)
Anion gap: 2 — ABNORMAL LOW (ref 5–15)
BUN: 46 mg/dL — ABNORMAL HIGH (ref 8–23)
CO2: 36 mmol/L — ABNORMAL HIGH (ref 22–32)
Calcium: 8.9 mg/dL (ref 8.9–10.3)
Chloride: 116 mmol/L — ABNORMAL HIGH (ref 98–111)
Creatinine, Ser: 1.37 mg/dL — ABNORMAL HIGH (ref 0.61–1.24)
GFR, Estimated: 59 mL/min — ABNORMAL LOW (ref >60)
Glucose, Bld: 162 mg/dL — ABNORMAL HIGH (ref 70–99)
Phosphorus: 4.2 mg/dL (ref 2.5–4.6)
Potassium: 3.4 mmol/L — ABNORMAL LOW (ref 3.5–5.1)
Sodium: 154 mmol/L — ABNORMAL HIGH (ref 135–145)

## 2021-03-17 LAB — MAGNESIUM: Magnesium: 2.3 mg/dL (ref 1.7–2.4)

## 2021-03-17 MED ORDER — FREE WATER
250.0000 mL | Status: DC
  Administered 2021-03-17 – 2021-03-18 (×6): 250 mL

## 2021-03-17 NOTE — Progress Notes (Signed)
NAME:  Eddie Chapman, MRN:  [id number], DOB:  05/09/1961, LOS: 82 ADMISSION DATE:  02/23/2021  Brief Pt Description / Synopsis:  60 yo morbidly obese male with acute on chronic hypoxic and hypercapnic respiratory failure due to [place] and AECOPD, along with severe toxic metabolic encephalopathy.  Failure to wean from vent requiring Tracheostomy on 03/07/2021.  History of Present Illness:   Patient has a complex comorbid history including systolic CHF chronically, pulmonary hypertension, obstructive sleep apnea, chronic hypoxemia with respiratory failure, obesity hypoventilation syndrome with thoracic restriction, hormonal dysfunction with hypotestosteronism, dyslipidemia, metabolic syndrome, previous subarachnoid hemorrhage, history of traumatic thoracic spinal injury, depression.  Patient was admitted in January to the intensive care unit with altered mental status acute hypoxemic and hypercapnic respiratory failure with metabolic encephalopathy and left lower lobe severe pneumonia.  Throughout hospitalization he was noted to have lethargy with hypercapnic encephalopathy and became unresponsive a few times even postextubation while on medical floor.  Patient has never smoked in the past.   On this admission he is poorly responsive with hypercapnic hypoxemic respiratory failure          Events:      02/24/21- Patient responded well to mechanical ventilation with improvement in hypercapnia on ABG this am.  Seems that he had THC induced apnea/hypopnea based on laboratory testing.  There is bibasilar atelectasis induced severe hypoxemia, recruitment Metaneb therapy has been ordered. Plan to repeat CXR this afternoon with SBT today.  Reviewed plan with RN and RT today.     02/25/21- patient was unable to pass SBT today. He was able to come off propofol with RASS-0 and is weaned down on levophed.     02/26/21- patient continues to require levophed, he has been difficult  to wean. I have asked ID for consultation due to concern for sepsis possibly due to CAP. Unable to perform SBT today. Family at bedside.   02/27/21- patient on PRVC with heavy secretions, difficulty weaning with high narcotic tolerance. Patient had suicide attempt with requirement for psych.   6/1 failing to wean from vent, DNR status 6/2 failed to wean from vent, severe hypoxia 6/3 severe hypoxia, failure to wean from vent, needs [facility], ENT consulted 6/4 failure to wean from  6/6 failure to wean from vent, plan for [facility] 6/7 failure to wean from vent 6/8 plan for [facility] TODAY 6/9 left foot cyanosis, vasc surgery consulted 6/10: Agitation overnight, added oxycodone, valium, seroquel.  SBT in progress.  Working with PT 6/11: Less agitated, however, did not tolerate SBT today 6/12: Dilaudid being weaned off, less agitated.  Can actually speak with trach.  Tolerating SBT 03/12/21- patient febrile, Trache aspirate sent for viral workup. Patient with anasarca on examination. Adequate UOP.  On PRVC 35%, GCS3T.  03/13/21- TCC having difficulty finding CIR placement.  There is RLE swelling and hypothermia and DVT study was ordered, he is with severe anxiety during SBT today and we reviewed MAR with addition of Abilify due to no QTC effect.   03/14/21- patient is awake, he is attempting again for trache collar trial today.  His secretions are improved, he is vocalizing and trying to write for communication.  03/15/21- patient is alert he communicates via gesturing and attempts to vocalize.  Still in withdrawal and on precedex but calm and appropriate.  Plan is to wean precedex and get to PT/OT.  03/16/21- patient is improved mentation, plan for PMV and SLP with ongoing PNMR transfer for recovery.  He will need  psych eval at some point due to suicide attempt 03/17/21- patient is speaking via gesturing and writing.  Trach changed to XLT 6.  Ongoing plan for CIR.  Micro Data:  5/27: SARS-CoV-2 and influenza PCR>>  negative 5/27: Blood culture x2>> no growth 5/27: Urine>> no growth 5/27: MRSA PCR>> negative 5/28: Sputum>> normal respiratory flora 5/30: Respiratory viral panel>> negative 5/31: SPUTUM CULTURE >> ACINETOBACTER CALCOACETICUS/BAUMANNII COMPLEX      Antimicrobials:  Azithromycin 5/27>>5/29 Ceftriaxone 5/27>>5/29 Vancomycin 5/29>>5/30 Zosyn 5/29>>6/1 Unasyn 6/1>>  Antibiotics Given (last 72 hours)     None      Scheduled Meds:  ARIPiprazole  5 mg Per Tube Daily   chlorhexidine gluconate (MEDLINE KIT)  15 mL Mouth Rinse BID   Chlorhexidine Gluconate Cloth  6 each Topical Daily   cloNIDine  0.2 mg Per Tube Q6H   Followed by   cloNIDine  0.2 mg Per Tube TID   Followed by   Derrill Memo ON 03/18/2021] cloNIDine  0.2 mg Per Tube Q12H   Followed by   Derrill Memo ON 03/19/2021] cloNIDine  0.2 mg Per Tube Q24H   enoxaparin (LOVENOX) injection  0.5 mg/kg Subcutaneous Q24H   feeding supplement (PROSource TF)  90 mL Per Tube QID   free water  250 mL Per Tube Q4H   insulin aspart  0-15 Units Subcutaneous Q4H   mouth rinse  15 mL Mouth Rinse 10 times per day   pantoprazole sodium  40 mg Per Tube Daily   polyethylene glycol  17 g Per Tube Daily   senna-docusate  1 tablet Per Tube BID   sodium chloride flush  10-40 mL Intracatheter Q12H   Continuous Infusions:  sodium chloride Stopped (03/11/21 1855)   dexmedetomidine (PRECEDEX) IV infusion 0.6 mcg/kg/hr (03/17/21 0645)   feeding supplement (VITAL 1.5 CAL) 55 mL/hr at 03/16/21 1800   PRN Meds:.bisacodyl, clonazepam, docusate sodium, glycopyrrolate, hydrALAZINE, midazolam, oxyCODONE, polyethylene glycol, polyvinyl alcohol, sodium chloride flush, traZODone    Objective   Blood pressure 123/80, pulse 78, temperature 98.7 F (37.1 C), temperature source Oral, resp. rate (!) 23, height '5\' 10"'  (1.778 m), weight (!) 141.5 kg, SpO2 95 %.    Vent Mode: PRVC FiO2 (%):  [35 %] 35 % Set Rate:  [15 bmp] 15 bmp Vt Set:  [550 mL] 550 mL PEEP:  [5  cmH20] 5 cmH20 Plateau Pressure:  [21 cmH20] 21 cmH20   Intake/Output Summary (Last 24 hours) at 03/17/2021 0917 Last data filed at 03/17/2021 1517 Gross per 24 hour  Intake 1453.69 ml  Output 900 ml  Net 553.69 ml    Filed Weights   03/15/21 0500 03/16/21 0359 03/17/21 0407  Weight: (!) 142.9 kg (!) 144.3 kg (!) 141.5 kg      REVIEW OF SYSTEMS PATIENT IS UNABLE TO PROVIDE COMPLETE REVIEW OF SYSTEMS DUE TO SEVERE CRITICAL ILLNESS AND  ENCEPHALOPATHY   PHYSICAL EXAMINATION:  GENERAL: Morbidly obese, chronically ill-appearing male, laying in bed, undergoing spontaneous breathing trial, no acute distress NECK: Supple.  No, JVD, tracheostomy midline PULMONARY: Mechanical breath sounds, no tachypnea, even CARDIOVASCULAR: S1 and S2. Regular rate and rhythm. No murmurs, rubs, or gallops.  GASTROINTESTINAL: Massively obese, soft, nontender, nondistended, no guarding or rebound tenderness, bowel sounds positive x4 MUSCULOSKELETAL: 2+ edema NEUROLOGIC: awake alert with mild encephalopathy no FND grossly  SKIN: Warm and dry.  No obvious rashes, lesions, ulcerations     Assessment / Plan:    Acute on Chronic Hypoxic and Hypercapnic Respiratory Failure in the setting of ACINOTOBACTER  SPECIES PNEUMONIA and AECOPD -Unable to wean from vent ~ S/P tracheostomy on 03/07/21 -Full vent support, implement lung protective strategies -Wean FiO2 and PEEP as tolerated to maintain O2 sats 88 to 92% -Follow intermittent chest x-ray and ABG as needed -Spontaneous breathing trial/trach collar trials as tolerated, tolerating today -Bronchodilators and budesonide nebs -Completed antibiotics             Short term plan for next 24h: >> reduce IV infusion volume.  Reduce sedative/centrally acting RX.  Repeat SBT.  Continue Diuresis.    ACINETOBACTER CALCOACETICUS/BAUMANNII COMPLEX PNEUMONIA -Monitor fever curve -Trend WBCs -Follow cultures as above -To complete course of Unasyn    Suicide  attmept    - wife reports prior to admission patient was holding gun in his hand while in backyard and stated suicidal ideation    -will order consultation with psychiatry   Septic Shock>>resolved PMHx of Hypertension -Continuous cardiac monitoring -Maintain MAP greater than 65 -Vasopressors weaned off -Patient now hypertensive, discontinued midodrine  Acute toxic metabolic encephalopathy, need for sedation -Maintain RASS goal of 0 to -1 -Dilaudid and Precedex drips as needed -Weaning off Dilaudid -Continue to wean off sedatives -Avoid sedating medications as able -Add scheduled oxycodone, Seroquel -Discontinued Valium, Klonopin 1 mg twice daily -Provide supportive care, encourage family presence -Promote normal sleep/wake cycle -PT/OT consults    Best practice (right click and "Reselect all SmartList Selections" daily)  Diet:  Tube Feed  Pain/Anxiety/Delirium protocol (if indicated): Yes (RASS goal 0 to -1 ) VAP protocol (if indicated): Yes DVT prophylaxis: Subcutaneous Heparin GI prophylaxis: PPI Glucose control:  SSI Yes Central venous access:  Yes, and it is still needed Arterial line:  N/A Foley:  N/A Mobility:  bed rest  PT consulted: yes Code Status:  DNR Disposition: ICU    Labs   CBC: Recent Labs  Lab 03/13/21 0512 03/14/21 0446 03/15/21 0507 03/16/21 0524 03/17/21 0356  WBC 9.8 10.1 9.6 9.9 7.6  NEUTROABS  --  7.9* 7.8* 7.9* 5.8  HGB 12.5* 12.1* 12.1* 13.0 12.3*  HCT 38.7* 39.0 38.1* 41.4 40.4  MCV 101.3* 103.4* 103.0* 104.5* 105.8*  PLT 163 179 209 206 201     Basic Metabolic Panel: Recent Labs  Lab 03/12/21 0539 03/13/21 0512 03/14/21 0446 03/15/21 0507 03/16/21 0524 03/17/21 0356  NA 151* 151* 152* 154*  --  154*  K 3.0* 3.4* 3.4* 3.9  --  3.4*  CL 112* 107 109 114*  --  116*  CO2 34* 34* 35* 33*  --  36*  GLUCOSE 101* 105* 154* 125*  --  162*  BUN 34* 46* 55* 37*  --  46*  CREATININE 0.93 1.23 1.56* 1.29*  --  1.37*  CALCIUM  8.1* 9.6 9.2 9.3  --  8.9  MG 1.7 2.3 2.5* 2.4 2.3 2.3  PHOS 2.5 3.6 4.2 3.5 3.4 4.2    GFR: Estimated Creatinine Clearance: 80.4 mL/min (A) (by C-G formula based on SCr of 1.37 mg/dL (H)). Recent Labs  Lab 03/14/21 0446 03/15/21 0507 03/16/21 0524 03/17/21 0356  WBC 10.1 9.6 9.9 7.6     Liver Function Tests: Recent Labs  Lab 03/17/21 0356  ALBUMIN 3.3*   No results for input(s): LIPASE, AMYLASE in the last 168 hours. No results for input(s): AMMONIA in the last 168 hours.  ABG    Component Value Date/Time   PHART 7.40 03/02/2021 2000   PCO2ART 69 (HH) 03/02/2021 2000   PO2ART 137 (H) 03/02/2021 2000   HCO3  42.7 (H) 03/02/2021 2000   O2SAT 99.1 03/02/2021 2000      Coagulation Profile: No results for input(s): INR, PROTIME in the last 168 hours.  Cardiac Enzymes: No results for input(s): CKTOTAL, CKMB, CKMBINDEX, TROPONINI in the last 168 hours.  HbA1C: Hgb A1c MFr Bld  Date/Time Value Ref Range Status  03/02/2021 04:52 AM 5.9 (H) 4.8 - 5.6 % Final    Comment:    (NOTE)         Prediabetes: 5.7 - 6.4         Diabetes: >6.4         Glycemic control for adults with diabetes: <7.0     CBG: Recent Labs  Lab 03/16/21 1547 03/16/21 1928 03/17/21 0010 03/17/21 0344 03/17/21 0722  GLUCAP 112* 133* 95 124* 121*     Allergies Allergies  Allergen Reactions   Divalproex Sodium Other (See Comments)    Elevated liver enzymes   Valproic Acid     unknown     The patient is critically ill with multiple organ systems failure and requires high complexity decision making for assessment and support, frequent evaluation and titration of therapies, application of advanced monitoring technologies and extensive interpretation of multiple databases. Critical Care Time devoted to patient care services described in this note is 35 minutes.      Ottie Glazier, M.D.  [facility] [place]     *This note was dictated using  voice recognition software/Dragon.  Despite best efforts to proofread, errors can occur which can change the meaning.  Any change was purely unintentional.

## 2021-03-18 LAB — BASIC METABOLIC PANEL
Anion gap: 7 (ref 5–15)
BUN: 51 mg/dL — ABNORMAL HIGH (ref 8–23)
CO2: 32 mmol/L (ref 22–32)
Calcium: 8.7 mg/dL — ABNORMAL LOW (ref 8.9–10.3)
Chloride: 118 mmol/L — ABNORMAL HIGH (ref 98–111)
Creatinine, Ser: 1.41 mg/dL — ABNORMAL HIGH (ref 0.61–1.24)
GFR, Estimated: 57 mL/min — ABNORMAL LOW (ref >60)
Glucose, Bld: 124 mg/dL — ABNORMAL HIGH (ref 70–99)
Potassium: 3.1 mmol/L — ABNORMAL LOW (ref 3.5–5.1)
Sodium: 157 mmol/L — ABNORMAL HIGH (ref 135–145)

## 2021-03-18 LAB — CBC WITH DIFFERENTIAL/PLATELET
Abs Immature Granulocytes: 0.03 10*3/uL (ref 0.00–0.07)
Basophils Absolute: 0 10*3/uL (ref 0.0–0.1)
Basophils Relative: 0 %
Eosinophils Absolute: 0 10*3/uL (ref 0.0–0.5)
Eosinophils Relative: 0 %
HCT: 41.1 % (ref 39.0–52.0)
Hemoglobin: 12.7 g/dL — ABNORMAL LOW (ref 13.0–17.0)
Immature Granulocytes: 0 %
Lymphocytes Relative: 8 %
Lymphs Abs: 0.8 10*3/uL (ref 0.7–4.0)
MCH: 32.6 pg (ref 26.0–34.0)
MCHC: 30.9 g/dL (ref 30.0–36.0)
MCV: 105.7 fL — ABNORMAL HIGH (ref 80.0–100.0)
Monocytes Absolute: 1.1 10*3/uL — ABNORMAL HIGH (ref 0.1–1.0)
Monocytes Relative: 11 %
Neutro Abs: 7.6 10*3/uL (ref 1.7–7.7)
Neutrophils Relative %: 81 %
Platelets: 210 10*3/uL (ref 150–400)
RBC: 3.89 MIL/uL — ABNORMAL LOW (ref 4.22–5.81)
RDW: 13.4 % (ref 11.5–15.5)
WBC: 9.5 10*3/uL (ref 4.0–10.5)
nRBC: 0 % (ref 0.0–0.2)

## 2021-03-18 LAB — GLUCOSE, CAPILLARY
Glucose-Capillary: 129 mg/dL — ABNORMAL HIGH (ref 70–99)
Glucose-Capillary: 114 mg/dL — ABNORMAL HIGH (ref 70–99)
Glucose-Capillary: 129 mg/dL — ABNORMAL HIGH (ref 70–99)
Glucose-Capillary: 123 mg/dL — ABNORMAL HIGH (ref 70–99)
Glucose-Capillary: 133 mg/dL — ABNORMAL HIGH (ref 70–99)

## 2021-03-18 LAB — MAGNESIUM: Magnesium: 2.4 mg/dL (ref 1.7–2.4)

## 2021-03-18 LAB — PHOSPHORUS: Phosphorus: 3.8 mg/dL (ref 2.5–4.6)

## 2021-03-18 MED ORDER — DEXTROSE 10 % IV SOLN: INTRAVENOUS | Status: DC

## 2021-03-18 MED ORDER — DEXTROSE 5 % IV SOLN: INTRAVENOUS | Status: DC

## 2021-03-18 MED ORDER — FREE WATER
250.0000 mL | Status: DC
  Administered 2021-03-18 – 2021-03-19 (×14): 250 mL

## 2021-03-18 MED ORDER — POTASSIUM CHLORIDE 20 MEQ PO PACK
40.0000 meq | PACK | Freq: Once | ORAL | Status: AC
  Administered 2021-03-18: 40 meq
  Filled 2021-03-18: qty 2

## 2021-03-18 MED ORDER — MIDAZOLAM HCL 2 MG/2ML IJ SOLN
2.0000 mg | INTRAMUSCULAR | Status: DC | PRN
  Administered 2021-03-20 – 2021-03-22 (×3): 2 mg via INTRAVENOUS
  Filled 2021-03-18 (×3): qty 2

## 2021-03-18 MED ORDER — CHLORHEXIDINE GLUCONATE CLOTH 2 % EX PADS
6.0000 | MEDICATED_PAD | Freq: Every day | CUTANEOUS | Status: DC
  Administered 2021-03-18 – 2021-04-01 (×15): 6 via TOPICAL

## 2021-03-18 MED ORDER — MIDAZOLAM HCL 2 MG/2ML IJ SOLN
INTRAMUSCULAR | Status: AC
  Administered 2021-03-18: 2 mg via INTRAVENOUS
  Filled 2021-03-18: qty 2

## 2021-03-18 NOTE — Progress Notes (Signed)
PHARMACY CONSULT NOTE  Pharmacy Consult for Electrolyte Monitoring and Replacement   Recent Labs: Potassium (mmol/L)  Date Value  03/18/2021 3.1 (L)  05/04/2014 4.3   Magnesium (mg/dL)  Date Value  03/18/2021 2.4   Calcium (mg/dL)  Date Value  03/18/2021 8.7 (L)   Calcium, Total (mg/dL)  Date Value  05/04/2014 9.1   Albumin (g/dL)  Date Value  03/17/2021 3.3 (L)   Phosphorus (mg/dL)  Date Value  03/18/2021 3.8   Sodium (mmol/L)  Date Value  03/18/2021 157 (H)  05/04/2014 140   Corrected Ca: 9.3 mg/dL  Assessment: 60 y/o male with a h/o HTN, HLD, OSA, CHF, MDD, GERD, cerebral aneurysm and pulmonary HTN who is admitted with CAP, COPD exacerbation and encephalopathy, s/p tracheostomy and NGT 6/8  Nutrition: vital at 55 mL/hr  Goal of Therapy:  Electrolytes WNL  Plan:  Hypernatremia: free water increased to 250 mL per tube q2h by NP 40 mEq KCl per tube x 1 Recheck electrolytes in am   Eddie Chapman ,PharmD Clinical Pharmacist 03/18/2021 12:04 PM

## 2021-03-18 NOTE — Progress Notes (Signed)
Patient spent most of the day in pressure support on the ventilator, albeit at a higher rate of pressure support (15). Patient was alert and responsive this shift, answered roughly 50% of questions asked of him.

## 2021-03-18 NOTE — Progress Notes (Signed)
NAME:  Eddie Chapman, MRN:  [id number], DOB:  02/22/1961, LOS: 61 ADMISSION DATE:  02/23/2021  Brief Pt Description / Synopsis:  60 yo morbidly obese male with acute on chronic hypoxic and hypercapnic respiratory failure due to [place] and AECOPD, along with severe toxic metabolic encephalopathy.  Failure to wean from vent requiring Tracheostomy on 03/07/2021.  History of Present Illness:   Patient has a complex comorbid history including systolic CHF chronically, pulmonary hypertension, obstructive sleep apnea, chronic hypoxemia with respiratory failure, obesity hypoventilation syndrome with thoracic restriction, hormonal dysfunction with hypotestosteronism, dyslipidemia, metabolic syndrome, previous subarachnoid hemorrhage, history of traumatic thoracic spinal injury, depression.  Patient was admitted in January to the intensive care unit with altered mental status acute hypoxemic and hypercapnic respiratory failure with metabolic encephalopathy and left lower lobe severe pneumonia.  Throughout hospitalization he was noted to have lethargy with hypercapnic encephalopathy and became unresponsive a few times even postextubation while on medical floor.  Patient has never smoked in the past.   On this admission he is poorly responsive with hypercapnic hypoxemic respiratory failure          Events:      02/24/21- Patient responded well to mechanical ventilation with improvement in hypercapnia on ABG this am.  Seems that he had THC induced apnea/hypopnea based on laboratory testing.  There is bibasilar atelectasis induced severe hypoxemia, recruitment Metaneb therapy has been ordered. Plan to repeat CXR this afternoon with SBT today.  Reviewed plan with RN and RT today.     02/25/21- patient was unable to pass SBT today. He was able to come off propofol with RASS-0 and is weaned down on levophed.     02/26/21- patient continues to require levophed, he has been difficult  to wean. I have asked ID for consultation due to concern for sepsis possibly due to CAP. Unable to perform SBT today. Family at bedside.   02/27/21- patient on PRVC with heavy secretions, difficulty weaning with high narcotic tolerance. Patient had suicide attempt with requirement for psych.   6/1 failing to wean from vent, DNR status 6/2 failed to wean from vent, severe hypoxia 6/3 severe hypoxia, failure to wean from vent, needs [facility], ENT consulted 6/4 failure to wean from  6/6 failure to wean from vent, plan for [facility] 6/7 failure to wean from vent 6/8 plan for [facility] TODAY 6/9 left foot cyanosis, vasc surgery consulted 6/10: Agitation overnight, added oxycodone, valium, seroquel.  SBT in progress.  Working with PT 6/11: Less agitated, however, did not tolerate SBT today 6/12: Dilaudid being weaned off, less agitated.  Can actually speak with trach.  Tolerating SBT 03/12/21- patient febrile, Trache aspirate sent for viral workup. Patient with anasarca on examination. Adequate UOP.  On PRVC 35%, GCS3T.  03/13/21- TCC having difficulty finding CIR placement.  There is RLE swelling and hypothermia and DVT study was ordered, he is with severe anxiety during SBT today and we reviewed MAR with addition of Abilify due to no QTC effect.   03/14/21- patient is awake, he is attempting again for trache collar trial today.  His secretions are improved, he is vocalizing and trying to write for communication.  03/15/21- patient is alert he communicates via gesturing and attempts to vocalize.  Still in withdrawal and on precedex but calm and appropriate.  Plan is to wean precedex and get to PT/OT.  03/16/21- patient is improved mentation, plan for PMV and SLP with ongoing PNMR transfer for recovery.  He will need  psych eval at some point due to suicide attempt 03/17/21- patient is speaking via gesturing and writing.  Trach changed to XLT 6.  Ongoing plan for CIR.   03/18/21- patient continue to improve, he will be  able to join PT/OT with plan to move to [facility] inpatient rehab asap.      Micro Data:  5/27: SARS-CoV-2 and influenza PCR>> negative 5/27: Blood culture x2>> no growth 5/27: Urine>> no growth 5/27: MRSA PCR>> negative 5/28: Sputum>> normal respiratory flora 5/30: Respiratory viral panel>> negative 5/31: SPUTUM CULTURE >> ACINETOBACTER CALCOACETICUS/BAUMANNII COMPLEX      Antimicrobials:  Azithromycin 5/27>>5/29 Ceftriaxone 5/27>>5/29 Vancomycin 5/29>>5/30 Zosyn 5/29>>6/1 Unasyn 6/1>>  Antibiotics Given (last 72 hours)     None      Scheduled Meds:  ARIPiprazole  5 mg Per Tube Daily   chlorhexidine gluconate (MEDLINE KIT)  15 mL Mouth Rinse BID   Chlorhexidine Gluconate Cloth  6 each Topical Daily   cloNIDine  0.2 mg Per Tube Q12H   Followed by   [START ON 03/19/2021] cloNIDine  0.2 mg Per Tube Q24H   enoxaparin (LOVENOX) injection  0.5 mg/kg Subcutaneous Q24H   feeding supplement (PROSource TF)  90 mL Per Tube QID   free water  250 mL Per Tube Q2H   insulin aspart  0-15 Units Subcutaneous Q4H   mouth rinse  15 mL Mouth Rinse 10 times per day   pantoprazole sodium  40 mg Per Tube Daily   polyethylene glycol  17 g Per Tube Daily   potassium chloride  40 mEq Per Tube Once   senna-docusate  1 tablet Per Tube BID   sodium chloride flush  10-40 mL Intracatheter Q12H   Continuous Infusions:  sodium chloride Stopped (03/11/21 1855)   dexmedetomidine (PRECEDEX) IV infusion Stopped (03/17/21 0941)   feeding supplement (VITAL 1.5 CAL) 1,000 mL (03/18/21 1003)   PRN Meds:.bisacodyl, clonazepam, docusate sodium, glycopyrrolate, hydrALAZINE, midazolam, oxyCODONE, polyethylene glycol, polyvinyl alcohol, sodium chloride flush, traZODone    Objective   Blood pressure (!) 151/99, pulse (!) 118, temperature 99.2 F (37.3 C), temperature source Oral, resp. rate (!) 35, height _0  (1.778 m), weight (!) 141.5 kg, SpO2 95 %.    Vent Mode: PSV FiO2 (%):  [35 %] 35 % Set Rate:   [15 bmp] 15 bmp Vt Set:  [550 mL] 550 mL PEEP:  [5 cmH20] 5 cmH20 Pressure Support:  [15 cmH20] 15 cmH20 Plateau Pressure:  [22 cmH20-25 cmH20] 22 cmH20   Intake/Output Summary (Last 24 hours) at 03/18/2021 1226 Last data filed at 03/18/2021 1100 Gross per 24 hour  Intake 1817 ml  Output --  Net 1817 ml    Filed Weights   03/15/21 0500 03/16/21 0359 03/17/21 0407  Weight: (!) 142.9 kg (!) 144.3 kg (!) 141.5 kg      REVIEW OF SYSTEMS PATIENT IS UNABLE TO PROVIDE COMPLETE REVIEW OF SYSTEMS DUE TO SEVERE CRITICAL ILLNESS AND  ENCEPHALOPATHY   PHYSICAL EXAMINATION:  GENERAL: Morbidly obese, chronically ill-appearing male, laying in bed, undergoing spontaneous breathing trial, no acute distress NECK: Supple.  No, JVD, tracheostomy midline PULMONARY: Mechanical breath sounds, no tachypnea, even CARDIOVASCULAR: S1 and S2. Regular rate and rhythm. No murmurs, rubs, or gallops.  GASTROINTESTINAL: Massively obese, soft, nontender, nondistended, no guarding or rebound tenderness, bowel sounds positive x4 MUSCULOSKELETAL: 2+ edema NEUROLOGIC: awake alert with mild encephalopathy no FND grossly  SKIN: Warm and dry.  No obvious rashes, lesions, ulcerations     Assessment / Plan:  Acute on Chronic Hypoxic and Hypercapnic Respiratory Failure in the setting of ACINOTOBACTER SPECIES PNEUMONIA and AECOPD -Unable to wean from vent ~ S/P tracheostomy on 03/07/21 -Full vent support, implement lung protective strategies -Wean FiO2 and PEEP as tolerated to maintain O2 sats 88 to 92% -Follow intermittent chest x-ray and ABG as needed -Spontaneous breathing trial/trach collar trials as tolerated, tolerating today -Bronchodilators and budesonide nebs -Completed antibiotics             Short term plan for next 24h: >> reduce IV infusion volume.  Reduce sedative/centrally acting RX.  Repeat SBT.  Continue Diuresis.    ACINETOBACTER CALCOACETICUS/BAUMANNII COMPLEX PNEUMONIA -Monitor fever  curve -Trend WBCs -Follow cultures as above -s/p course of Unasyn    Suicide attmept    - wife reports prior to admission patient was holding gun in his hand while in backyard and stated suicidal ideation    -will order consultation with psychiatry   Septic Shock>>resolved PMHx of Hypertension -Continuous cardiac monitoring -Maintain MAP greater than 65 -Vasopressors weaned off -Patient now hypertensive, discontinued midodrine  Acute toxic metabolic encephalopathy, need for sedation -Maintain RASS goal of 0 to -1 -Dilaudid and Precedex drips as needed -Weaning off Dilaudid -Continue to wean off sedatives -Avoid sedating medications as able -Add scheduled oxycodone, Seroquel -Discontinued Valium, Klonopin 1 mg twice daily -Provide supportive care, encourage family presence -Promote normal sleep/wake cycle -PT/OT consults    Best practice (right click and "Reselect all SmartList Selections" daily)  Diet:  Tube Feed  Pain/Anxiety/Delirium protocol (if indicated): Yes (RASS goal 0 to -1 ) VAP protocol (if indicated): Yes DVT prophylaxis: Subcutaneous Heparin GI prophylaxis: PPI Glucose control:  SSI Yes Central venous access:  Yes, and it is still needed Arterial line:  N/A Foley:  N/A Mobility:  bed rest  PT consulted: yes Code Status:  DNR Disposition: ICU    Labs   CBC: Recent Labs  Lab 03/14/21 0446 03/15/21 0507 03/16/21 0524 03/17/21 0356 03/18/21 0522  WBC 10.1 9.6 9.9 7.6 9.5  NEUTROABS 7.9* 7.8* 7.9* 5.8 7.6  HGB 12.1* 12.1* 13.0 12.3* 12.7*  HCT 39.0 38.1* 41.4 40.4 41.1  MCV 103.4* 103.0* 104.5* 105.8* 105.7*  PLT 179 209 206 201 210     Basic Metabolic Panel: Recent Labs  Lab 03/13/21 0512 03/14/21 0446 03/15/21 0507 03/16/21 0524 03/17/21 0356 03/18/21 0522  NA 151* 152* 154*  --  154* 157*  K 3.4* 3.4* 3.9  --  3.4* 3.1*  CL 107 109 114*  --  116* 118*  CO2 34* 35* 33*  --  36* 32  GLUCOSE 105* 154* 125*  --  162* 124*  BUN  46* 55* 37*  --  46* 51*  CREATININE 1.23 1.56* 1.29*  --  1.37* 1.41*  CALCIUM 9.6 9.2 9.3  --  8.9 8.7*  MG 2.3 2.5* 2.4 2.3 2.3 2.4  PHOS 3.6 4.2 3.5 3.4 4.2 3.8    GFR: Estimated Creatinine Clearance: 78.1 mL/min (A) (by C-G formula based on SCr of 1.41 mg/dL (H)). Recent Labs  Lab 03/15/21 0507 03/16/21 0524 03/17/21 0356 03/18/21 0522  WBC 9.6 9.9 7.6 9.5     Liver Function Tests: Recent Labs  Lab 03/17/21 0356  ALBUMIN 3.3*    No results for input(s): LIPASE, AMYLASE in the last 168 hours. No results for input(s): AMMONIA in the last 168 hours.  ABG    Component Value Date/Time   PHART 7.40 03/02/2021 2000   PCO2ART 69 ([place]) 03/02/2021  2000   PO2ART 137 (H) 03/02/2021 2000   HCO3 42.7 (H) 03/02/2021 2000   O2SAT 99.1 03/02/2021 2000      Coagulation Profile: No results for input(s): INR, PROTIME in the last 168 hours.  Cardiac Enzymes: No results for input(s): CKTOTAL, CKMB, CKMBINDEX, TROPONINI in the last 168 hours.  HbA1C: Hgb A1c MFr Bld  Date/Time Value Ref Range Status  03/02/2021 04:52 AM 5.9 (H) 4.8 - 5.6 % Final    Comment:    (NOTE)         Prediabetes: 5.7 - 6.4         Diabetes: >6.4         Glycemic control for adults with diabetes: <7.0     CBG: Recent Labs  Lab 03/17/21 1859 03/17/21 2308 03/18/21 0417 03/18/21 0749 03/18/21 1109  GLUCAP 115* 159* 129* 114* 129*     Allergies Allergies  Allergen Reactions   Divalproex Sodium Other (See Comments)    Elevated liver enzymes   Valproic Acid     unknown     The patient is critically ill with multiple organ systems failure and requires high complexity decision making for assessment and support, frequent evaluation and titration of therapies, application of advanced monitoring technologies and extensive interpretation of multiple databases. Critical Care Time devoted to patient care services described in this note is 35 minutes.      Ottie Glazier, M.D.  Pulmonary &  [place]     *This note was dictated using voice recognition software/Dragon.  Despite best efforts to proofread, errors can occur which can change the meaning.  Any change was purely unintentional.

## 2021-03-19 DIAGNOSIS — J9602 Acute respiratory failure with hypercapnia: Secondary | ICD-10-CM | POA: Diagnosis not present

## 2021-03-19 DIAGNOSIS — J9601 Acute respiratory failure with hypoxia: Secondary | ICD-10-CM | POA: Diagnosis not present

## 2021-03-19 LAB — GLUCOSE, CAPILLARY
Glucose-Capillary: 116 mg/dL — ABNORMAL HIGH (ref 70–99)
Glucose-Capillary: 118 mg/dL — ABNORMAL HIGH (ref 70–99)
Glucose-Capillary: 126 mg/dL — ABNORMAL HIGH (ref 70–99)
Glucose-Capillary: 132 mg/dL — ABNORMAL HIGH (ref 70–99)
Glucose-Capillary: 136 mg/dL — ABNORMAL HIGH (ref 70–99)
Glucose-Capillary: 148 mg/dL — ABNORMAL HIGH (ref 70–99)
Glucose-Capillary: 155 mg/dL — ABNORMAL HIGH (ref 70–99)

## 2021-03-19 LAB — BASIC METABOLIC PANEL
Anion gap: 8 (ref 5–15)
BUN: 31 mg/dL — ABNORMAL HIGH (ref 8–23)
CO2: 33 mmol/L — ABNORMAL HIGH (ref 22–32)
Calcium: 8.6 mg/dL — ABNORMAL LOW (ref 8.9–10.3)
Chloride: 114 mmol/L — ABNORMAL HIGH (ref 98–111)
Creatinine, Ser: 1.04 mg/dL (ref 0.61–1.24)
GFR, Estimated: >60 mL/min (ref >60)
Glucose, Bld: 146 mg/dL — ABNORMAL HIGH (ref 70–99)
Potassium: 3.4 mmol/L — ABNORMAL LOW (ref 3.5–5.1)
Sodium: 155 mmol/L — ABNORMAL HIGH (ref 135–145)

## 2021-03-19 LAB — CBC WITH DIFFERENTIAL/PLATELET
Abs Immature Granulocytes: 0.05 10*3/uL (ref 0.00–0.07)
Basophils Absolute: 0 10*3/uL (ref 0.0–0.1)
Basophils Relative: 0 %
Eosinophils Absolute: 0 10*3/uL (ref 0.0–0.5)
Eosinophils Relative: 0 %
HCT: 40.5 % (ref 39.0–52.0)
Hemoglobin: 12.5 g/dL — ABNORMAL LOW (ref 13.0–17.0)
Immature Granulocytes: 0 %
Lymphocytes Relative: 7 %
Lymphs Abs: 0.8 10*3/uL (ref 0.7–4.0)
MCH: 32.6 pg (ref 26.0–34.0)
MCHC: 30.9 g/dL (ref 30.0–36.0)
MCV: 105.5 fL — ABNORMAL HIGH (ref 80.0–100.0)
Monocytes Absolute: 1 10*3/uL (ref 0.1–1.0)
Monocytes Relative: 9 %
Neutro Abs: 9.7 10*3/uL — ABNORMAL HIGH (ref 1.7–7.7)
Neutrophils Relative %: 84 %
Platelets: 197 10*3/uL (ref 150–400)
RBC: 3.84 MIL/uL — ABNORMAL LOW (ref 4.22–5.81)
RDW: 13.5 % (ref 11.5–15.5)
WBC: 11.6 10*3/uL — ABNORMAL HIGH (ref 4.0–10.5)
nRBC: 0 % (ref 0.0–0.2)

## 2021-03-19 LAB — PHOSPHORUS: Phosphorus: 2.6 mg/dL (ref 2.5–4.6)

## 2021-03-19 LAB — MAGNESIUM: Magnesium: 2.4 mg/dL (ref 1.7–2.4)

## 2021-03-19 MED ORDER — NUTRISOURCE FIBER PO PACK
1.0000 | PACK | Freq: Three times a day (TID) | ORAL | Status: DC
  Administered 2021-03-19 – 2021-03-20 (×3): 1
  Filled 2021-03-19 (×5): qty 1

## 2021-03-19 MED ORDER — POTASSIUM CHLORIDE 20 MEQ PO PACK
40.0000 meq | PACK | Freq: Once | ORAL | Status: AC
  Administered 2021-03-19: 40 meq
  Filled 2021-03-19: qty 2

## 2021-03-19 MED ORDER — FREE WATER
300.0000 mL | Status: DC
  Administered 2021-03-19 – 2021-03-20 (×9): 300 mL

## 2021-03-19 MED ORDER — PROSOURCE TF PO LIQD
90.0000 mL | Freq: Three times a day (TID) | ORAL | Status: DC
  Administered 2021-03-19 – 2021-03-26 (×20): 90 mL
  Filled 2021-03-19 (×23): qty 90

## 2021-03-19 MED ORDER — VITAL 1.5 CAL PO LIQD
1000.0000 mL | ORAL | Status: DC
  Administered 2021-03-19 – 2021-03-26 (×8): 1000 mL

## 2021-03-19 NOTE — Progress Notes (Signed)
PHARMACY CONSULT NOTE  Pharmacy Consult for Electrolyte Monitoring and Replacement   Recent Labs: Potassium (mmol/L)  Date Value  03/19/2021 3.4 (L)  05/04/2014 4.3   Magnesium (mg/dL)  Date Value  03/19/2021 2.4   Calcium (mg/dL)  Date Value  03/19/2021 8.6 (L)   Calcium, Total (mg/dL)  Date Value  05/04/2014 9.1   Albumin (g/dL)  Date Value  03/17/2021 3.3 (L)   Phosphorus (mg/dL)  Date Value  03/19/2021 2.6   Sodium (mmol/L)  Date Value  03/19/2021 155 (H)  05/04/2014 140   Corrected Ca: 9.3 mg/dL  Assessment: 60 y/o male with a h/o HTN, HLD, OSA, CHF, MDD, GERD, cerebral aneurysm and pulmonary HTN who is admitted with CAP, COPD exacerbation and encephalopathy, s/p tracheostomy and NGT 6/8  Nutrition: vital at 65 mL/hr, PROSource 90 mL TID  Goal of Therapy:  Electrolytes WNL  Plan:  Hypernatremia: free water increased to 300 mL per tube q2h by NP 40 mEq KCl per tube x 1 Recheck electrolytes in am   [name] ,PharmD Clinical Pharmacist 03/19/2021 7:26 AM

## 2021-03-19 NOTE — Progress Notes (Signed)
NAME:  Eddie Chapman, MRN:  [id number], DOB:  03/06/1961, LOS: 24 ADMISSION DATE:  02/23/2021  Brief Pt Description / Synopsis:  60 yo morbidly obese male with acute on chronic hypoxic and hypercapnic respiratory failure due to [place] and AECOPD, along with severe toxic metabolic encephalopathy.  Failure to wean from vent requiring Tracheostomy on 03/07/2021.  History of Present Illness:   Patient has a complex comorbid history including systolic CHF chronically, pulmonary hypertension, obstructive sleep apnea, chronic hypoxemia with respiratory failure, obesity hypoventilation syndrome with thoracic restriction, hormonal dysfunction with hypotestosteronism, dyslipidemia, metabolic syndrome, previous subarachnoid hemorrhage, history of traumatic thoracic spinal injury, depression.  Patient was admitted in January to the intensive care unit with altered mental status acute hypoxemic and hypercapnic respiratory failure with metabolic encephalopathy and left lower lobe severe pneumonia.  Throughout hospitalization he was noted to have lethargy with hypercapnic encephalopathy and became unresponsive a few times even postextubation while on medical floor.  Patient has never smoked in the past.   On this admission he is poorly responsive with hypercapnic hypoxemic respiratory failure          Events:      02/24/21- Patient responded well to mechanical ventilation with improvement in hypercapnia on ABG this am.  Seems that he had THC induced apnea/hypopnea based on laboratory testing.  There is bibasilar atelectasis induced severe hypoxemia, recruitment Metaneb therapy has been ordered. Plan to repeat CXR this afternoon with SBT today.  Reviewed plan with RN and RT today.     02/25/21- patient was unable to pass SBT today. He was able to come off propofol with RASS-0 and is weaned down on levophed.     02/26/21- patient continues to require levophed, he has been difficult  to wean. I have asked ID for consultation due to concern for sepsis possibly due to CAP. Unable to perform SBT today. Family at bedside.   02/27/21- patient on PRVC with heavy secretions, difficulty weaning with high narcotic tolerance. Patient had suicide attempt with requirement for psych.   6/1 failing to wean from vent, DNR status 6/2 failed to wean from vent, severe hypoxia 6/3 severe hypoxia, failure to wean from vent, needs [facility], ENT consulted 6/4 failure to wean from  6/6 failure to wean from vent, plan for [facility] 6/7 failure to wean from vent 6/8 plan for [facility] TODAY 6/9 left foot cyanosis, vasc surgery consulted 6/10: Agitation overnight, added oxycodone, valium, seroquel.  SBT in progress.  Working with PT 6/11: Less agitated, however, did not tolerate SBT today 6/12: Dilaudid being weaned off, less agitated.  Can actually speak with trach.  Tolerating SBT 03/12/21- patient febrile, Trache aspirate sent for viral workup. Patient with anasarca on examination. Adequate UOP.  On PRVC 35%, GCS3T.  03/13/21- TCC having difficulty finding CIR placement.  There is RLE swelling and hypothermia and DVT study was ordered, he is with severe anxiety during SBT today and we reviewed MAR with addition of Abilify due to no QTC effect.   03/14/21- patient is awake, he is attempting again for trache collar trial today.  His secretions are improved, he is vocalizing and trying to write for communication.  03/15/21- patient is alert he communicates via gesturing and attempts to vocalize.  Still in withdrawal and on precedex but calm and appropriate.  Plan is to wean precedex and get to PT/OT.  03/16/21- patient is improved mentation, plan for PMV and SLP with ongoing PN[facility] transfer for recovery.  He will need  psych eval at some point due to suicide attempt 03/17/21- patient is speaking via gesturing and writing.  Trach changed to XLT 6.  Ongoing plan for CIR.   03/18/21- patient continue to improve, he will be  able to join PT/OT with plan to move to [facility] inpatient rehab asap.  03/19/21- SBT vs Trach collar trials during day, rest on vent at night.  Lasix x1 today   Micro Data:  5/27: SARS-CoV-2 and influenza PCR>> negative 5/27: Blood culture x2>> no growth 5/27: Urine>> no growth 5/27: MRSA PCR>> negative 5/28: Sputum>> normal respiratory flora 5/30: Respiratory viral panel>> negative 5/31: SPUTUM CULTURE >> ACINETOBACTER CALCOACETICUS/BAUMANNII COMPLEX  6/13: Tracheal aspirate>>ACINETOBACTER CALCOACETICUS/BAUMANNII COMPLEX      Antimicrobials:  Azithromycin 5/27>>5/29 Ceftriaxone 5/27>>5/29 Vancomycin 5/29>>5/30 Zosyn 5/29>>6/1 Unasyn 6/1>>6/10  Antibiotics Given (last 72 hours)     None      Scheduled Meds:  ARIPiprazole  5 mg Per Tube Daily   chlorhexidine gluconate (MEDLINE KIT)  15 mL Mouth Rinse BID   Chlorhexidine Gluconate Cloth  6 each Topical Daily   cloNIDine  0.2 mg Per Tube Q24H   enoxaparin (LOVENOX) injection  0.5 mg/kg Subcutaneous Q24H   feeding supplement (PROSource TF)  90 mL Per Tube TID   fiber  1 packet Per Tube TID   free water  300 mL Per Tube Q2H   insulin aspart  0-15 Units Subcutaneous Q4H   mouth rinse  15 mL Mouth Rinse 10 times per day   pantoprazole sodium  40 mg Per Tube Daily   polyethylene glycol  17 g Per Tube Daily   senna-docusate  1 tablet Per Tube BID   sodium chloride flush  10-40 mL Intracatheter Q12H   Continuous Infusions:  sodium chloride Stopped (03/11/21 1855)   feeding supplement (VITAL 1.5 CAL) 1,000 mL (03/19/21 1304)   PRN Meds:.bisacodyl, clonazepam, docusate sodium, glycopyrrolate, hydrALAZINE, midazolam, oxyCODONE, polyethylene glycol, polyvinyl alcohol, sodium chloride flush, traZODone    Objective   Blood pressure (!) 149/89, pulse (!) 107, temperature 99.3 F (37.4 C), temperature source Oral, resp. rate (!) 28, height _0  (1.778 m), weight (!) 141.5 kg, SpO2 100 %.    Vent Mode: PSV FiO2 (%):  [35 %] 35  % Set Rate:  [15 bmp] 15 bmp Vt Set:  [55 mL-550 mL] 550 mL PEEP:  [5 cmH20] 5 cmH20 Pressure Support:  [15 cmH20] 15 cmH20 Plateau Pressure:  [17 cmH20-26 cmH20] 17 cmH20   Intake/Output Summary (Last 24 hours) at 03/19/2021 1436 Last data filed at 03/19/2021 1400 Gross per 24 hour  Intake 3222.49 ml  Output --  Net 3222.49 ml    Filed Weights   03/15/21 0500 03/16/21 0359 03/17/21 0407  Weight: (!) 142.9 kg (!) 144.3 kg (!) 141.5 kg      REVIEW OF SYSTEMS PATIENT IS UNABLE TO PROVIDE COMPLETE REVIEW OF SYSTEMS DUE TO SEVERE CRITICAL ILLNESS AND  ENCEPHALOPATHY   PHYSICAL EXAMINATION:  GENERAL: Acute on chronically ill appearing Morbidly obese male, sitting in bed, on vent via trach, in NAD NECK: Supple.  No, JVD, tracheostomy midline PULMONARY: Coarse breath sounds bilaterally, no wheezing, even, synchronous with the vent CARDIOVASCULAR: Tachycardia, regular rhythm, s1s2.  No murmurs, rubs, or gallops.  GASTROINTESTINAL: Massively obese, soft, nontender, nondistended, no guarding or rebound tenderness, bowel sounds positive x4 MUSCULOSKELETAL: Normal bulk and tone, no deformities, 2+ edema BLE NEUROLOGIC: Awake and alert, moves all extremities to commands, difficult to access orientation due to trach SKIN: Warm and dry.  No obvious rashes, lesions, ulcerations     Assessment / Plan:    Acute on Chronic Hypoxic and Hypercapnic Respiratory Failure in the setting of ACINOTOBACTER SPECIES PNEUMONIA and AECOPD -Unable to wean from vent ~ S/P tracheostomy on 03/07/21 -Full vent support, implement lung protective strategies -Wean FiO2 and PEEP as tolerated to maintain O2 sats 88 to 92% -Follow intermittent chest x-ray and ABG as needed -Spontaneous breathing trial/trach collar trials as tolerated -Bronchodilators and budesonide nebs -Completed course of Unasyn. -Will give 40 mg IV Lasix x1 dose today 03/19/21              ACINETOBACTER CALCOACETICUS/BAUMANNII COMPLEX  PNEUMONIA -Monitor fever curve -Trend WBCs -Follow cultures as above -s/p course of Unasyn  Septic Shock>>resolved PMHx of Hypertension -Continuous cardiac monitoring -Maintain MAP greater than 65 -Vasopressors weaned off -Patient now hypertensive, continue clonidine  Hypernatremia Mild Hypokalemia -Monitor I&O's / urinary output -Follow BMP -Ensure adequate renal perfusion -Avoid nephrotoxic agents as able -Replace electrolytes as indicated -Pharmacy following for assistance with electrolyte replacement -Increase free water flushes to 300 ml q2h  Acute toxic metabolic encephalopathy>>improving Reported Suicide Attempt (wife reports prior to admission patient was holding gun in his hand while in backyard and stated suicidal ideation) -Avoid sedating medications as able -Continue Abilify -PRN Klonopin, Oxycodone -Provide supportive care, encourage family presence -Promote normal sleep/wake cycle -PT/OT consults -Psych consulted    Best practice (right click and "Reselect all SmartList Selections" daily)  Diet:  Tube Feed  Pain/Anxiety/Delirium protocol (if indicated): Yes (RASS goal 0 to -1 ) VAP protocol (if indicated): Yes DVT prophylaxis: Lovenox GI prophylaxis: PPI Glucose control:  SSI Yes Central venous access:  Yes, and it is still needed Arterial line:  N/A Foley:  N/A Mobility:  OOB as tolerated PT consulted: yes Code Status:  DNR Disposition: ICU    Labs   CBC: Recent Labs  Lab 03/15/21 0507 03/16/21 0524 03/17/21 0356 03/18/21 0522 03/19/21 0513  WBC 9.6 9.9 7.6 9.5 11.6*  NEUTROABS 7.8* 7.9* 5.8 7.6 9.7*  HGB 12.1* 13.0 12.3* 12.7* 12.5*  HCT 38.1* 41.4 40.4 41.1 40.5  MCV 103.0* 104.5* 105.8* 105.7* 105.5*  PLT 209 206 201 210 197     Basic Metabolic Panel: Recent Labs  Lab 03/14/21 0446 03/15/21 0507 03/16/21 0524 03/17/21 0356 03/18/21 0522 03/19/21 0513  NA 152* 154*  --  154* 157* 155*  K 3.4* 3.9  --  3.4* 3.1* 3.4*  CL  109 114*  --  116* 118* 114*  CO2 35* 33*  --  36* 32 33*  GLUCOSE 154* 125*  --  162* 124* 146*  BUN 55* 37*  --  46* 51* 31*  CREATININE 1.56* 1.29*  --  1.37* 1.41* 1.04  CALCIUM 9.2 9.3  --  8.9 8.7* 8.6*  MG 2.5* 2.4 2.3 2.3 2.4 2.4  PHOS 4.2 3.5 3.4 4.2 3.8 2.6    GFR: Estimated Creatinine Clearance: 105.9 mL/min (by C-G formula based on SCr of 1.04 mg/dL). Recent Labs  Lab 03/16/21 0524 03/17/21 0356 03/18/21 0522 03/19/21 0513  WBC 9.9 7.6 9.5 11.6*     Liver Function Tests: Recent Labs  Lab 03/17/21 0356  ALBUMIN 3.3*    No results for input(s): LIPASE, AMYLASE in the last 168 hours. No results for input(s): AMMONIA in the last 168 hours.  ABG    Component Value Date/Time   PHART 7.40 03/02/2021 2000   PCO2ART 69 (HH) 03/02/2021 2000   PO2ART 137 (H)  03/02/2021 2000   HCO3 42.7 (H) 03/02/2021 2000   O2SAT 99.1 03/02/2021 2000      Coagulation Profile: No results for input(s): INR, PROTIME in the last 168 hours.  Cardiac Enzymes: No results for input(s): CKTOTAL, CKMB, CKMBINDEX, TROPONINI in the last 168 hours.  HbA1C: Hgb A1c MFr Bld  Date/Time Value Ref Range Status  03/02/2021 04:52 AM 5.9 (H) 4.8 - 5.6 % Final    Comment:    (NOTE)         Prediabetes: 5.7 - 6.4         Diabetes: >6.4         Glycemic control for adults with diabetes: <7.0     CBG: Recent Labs  Lab 03/18/21 1928 03/19/21 0001 03/19/21 0404 03/19/21 0722 03/19/21 1103  GLUCAP 133* 136* 126* 118* 155*     Allergies Allergies  Allergen Reactions   Divalproex Sodium Other (See Comments)    Elevated liver enzymes   Valproic Acid     unknown     Critical Care Time: 35 minutes  Darel Hong, AGACNP-BC [facility] Prefer epic messenger for cross cover needs If after hours, please call E-link

## 2021-03-19 NOTE — Progress Notes (Signed)
OT Cancellation Note  Patient Details Name: Eddie Chapman MRN: [id number] DOB: May 25, 1961   Cancelled Treatment:    Reason Eval/Treat Not Completed: Patient declined, no reason specified. RN cleared pt to participate with therapy. Upon attempted co-tx with PT, pt attempting to mouth words quickly. Provided with paper and pencil and encouraged to write down. Declines, stating "not now," "next day," and "what part of no..." Therapists attempted to encourage pt but he appeared to become more agitated. Agreeable to re-attempt next morning. RN notified.   Hanley Hays, MPH, MS, OTR/L ascom [phone] 03/19/21, 2:46 PM

## 2021-03-19 NOTE — Progress Notes (Signed)
Nutrition Follow-up  DOCUMENTATION CODES:   Morbid obesity  INTERVENTION:   Increase Vital 1.5 to 30m/hr + ProSource TF 970mTID  Free water flushes 30076m2 hours per MD  Regimen provides 2580kcal/day, 171g/day protein and 4792m73my free water   Nutrisource Fiber TID via tube   NUTRITION DIAGNOSIS:   Increased nutrient needs related to acute illness as evidenced by estimated needs.  GOAL:   Provide needs based on ASPEN/SCCM guidelines -met with tube feeds   MONITOR:   Vent status, Labs, Weight trends, TF tolerance, Skin, I & O's  ASSESSMENT:   60 y59 male with a h/o HTN, HLD, OSA, CHF, MDD, GERD, cerebral aneurysm and pulmonary HTN who is admitted with CAP, COPD exacerbation and encephalopathy.   Pt remains ventilated via trach. NGT replaced 6/17; pt tolerating tube feeds well at goal rate. Will adjust needs as pt s/p trach. Pt with continued hypernatremia; free water flushes increased per MD. Pt having multiple episodes of diarrhea; will add soluble fiber. Per chart, pt is up ~27lbs from his UBW but is down 18lbs since admit. Pt +5.9L on his I & O's. Plan is for CIR at discharge.   Medications reviewed and include: lovenox, insulin, protonix, miralax, senokot  Labs reviewed: Na 155(H), K 3.4(L), BUN 31(H), P 2.6 wnl, Mg 2.4 wnl Wbc- 11.6(H) Cbgs- 136, 126, 118, 155 x 24 hrs  Patient is currently intubated on ventilator support MV: 11.0 L/min Temp (24hrs), Avg:99.4 F (37.4 C), Min:99 F (37.2 C), Max:99.6 F (37.6 C)  Propofol: none   MAP- >65mm62mUOP- 3669ml 58mt Order:   Diet Order             Diet NPO time specified  Diet effective now                  EDUCATION NEEDS:   No education needs have been identified at this time  Skin:  Skin Assessment: Reviewed RN Assessment  Last BM:  6/20- TYPE 7  Height:   Ht Readings from Last 1 Encounters:  03/18/21 _0  (1.778 m)    Weight:   Wt Readings from Last 1 Encounters:  03/17/21  (!) 141.5 kg    Ideal Body Weight:  75.5 kg  BMI:  Body mass index is 44.76 kg/m.  Estimated Nutritional Needs:   Kcal:  2548kcal/day  Protein:  >150g/day- 189g/day  Fluid:  2.3-2.6L/day  Ranata Laughery Koleen DistanceD, LDN Please refer to [facility] and/or RD on-call/weekend/after hours pager

## 2021-03-19 NOTE — Progress Notes (Signed)
PT Cancellation Note  Patient Details Name: Eddie Chapman MRN: [id number] DOB: 1961-04-03   Cancelled Treatment:    Reason Eval/Treat Not Completed: Patient declined, no reason specified PT & OT attempted to see pt for co-tx. Pt received in bed with girlfriend present. Educated pt on plan for session & encouraged participation. Pt requires cuing to use written communication as he is unable to communicate with trach/ventilator. Pt only able to write words vs full sentences. Pt declining all participation in session without specifying a reason why. Notified nurse. Will f/u as able & as pt is agreeable to participation.   Lavone Nian, PT, DPT 03/19/21, 2:45 PM    Waunita Schooner 03/19/2021, 2:44 PM

## 2021-03-20 ENCOUNTER — Inpatient Hospital Stay: Payer: PPO

## 2021-03-20 LAB — BASIC METABOLIC PANEL
Anion gap: 7 (ref 5–15)
BUN: 31 mg/dL — ABNORMAL HIGH (ref 8–23)
CO2: 35 mmol/L — ABNORMAL HIGH (ref 22–32)
Calcium: 8.6 mg/dL — ABNORMAL LOW (ref 8.9–10.3)
Chloride: 112 mmol/L — ABNORMAL HIGH (ref 98–111)
Creatinine, Ser: 0.94 mg/dL (ref 0.61–1.24)
GFR, Estimated: >60 mL/min (ref >60)
Glucose, Bld: 145 mg/dL — ABNORMAL HIGH (ref 70–99)
Potassium: 3.7 mmol/L (ref 3.5–5.1)
Sodium: 154 mmol/L — ABNORMAL HIGH (ref 135–145)

## 2021-03-20 LAB — CBC WITH DIFFERENTIAL/PLATELET
Abs Immature Granulocytes: 0.05 10*3/uL (ref 0.00–0.07)
Basophils Absolute: 0 10*3/uL (ref 0.0–0.1)
Basophils Relative: 0 %
Eosinophils Absolute: 0.1 10*3/uL (ref 0.0–0.5)
Eosinophils Relative: 1 %
HCT: 38.5 % — ABNORMAL LOW (ref 39.0–52.0)
Hemoglobin: 11.5 g/dL — ABNORMAL LOW (ref 13.0–17.0)
Immature Granulocytes: 1 %
Lymphocytes Relative: 10 %
Lymphs Abs: 1 10*3/uL (ref 0.7–4.0)
MCH: 32.4 pg (ref 26.0–34.0)
MCHC: 29.9 g/dL — ABNORMAL LOW (ref 30.0–36.0)
MCV: 108.5 fL — ABNORMAL HIGH (ref 80.0–100.0)
Monocytes Absolute: 1 10*3/uL (ref 0.1–1.0)
Monocytes Relative: 11 %
Neutro Abs: 7.2 10*3/uL (ref 1.7–7.7)
Neutrophils Relative %: 77 %
Platelets: 168 10*3/uL (ref 150–400)
RBC: 3.55 MIL/uL — ABNORMAL LOW (ref 4.22–5.81)
RDW: 13.5 % (ref 11.5–15.5)
WBC: 9.2 10*3/uL (ref 4.0–10.5)
nRBC: 0 % (ref 0.0–0.2)

## 2021-03-20 LAB — GLUCOSE, CAPILLARY
Glucose-Capillary: 125 mg/dL — ABNORMAL HIGH (ref 70–99)
Glucose-Capillary: 127 mg/dL — ABNORMAL HIGH (ref 70–99)
Glucose-Capillary: 114 mg/dL — ABNORMAL HIGH (ref 70–99)
Glucose-Capillary: 142 mg/dL — ABNORMAL HIGH (ref 70–99)
Glucose-Capillary: 135 mg/dL — ABNORMAL HIGH (ref 70–99)

## 2021-03-20 LAB — MAGNESIUM: Magnesium: 2.5 mg/dL — ABNORMAL HIGH (ref 1.7–2.4)

## 2021-03-20 MED ORDER — HYDRALAZINE HCL 20 MG/ML IJ SOLN
25.0000 mg | INTRAMUSCULAR | Status: DC | PRN
  Administered 2021-03-22: 25 mg via INTRAVENOUS
  Filled 2021-03-20: qty 2

## 2021-03-20 MED ORDER — LABETALOL HCL 5 MG/ML IV SOLN
10.0000 mg | INTRAVENOUS | Status: DC | PRN
  Administered 2021-03-20: 10 mg via INTRAVENOUS
  Administered 2021-03-21 – 2021-03-22 (×4): 20 mg via INTRAVENOUS
  Filled 2021-03-20 (×5): qty 4

## 2021-03-20 MED ORDER — CLONIDINE HCL 0.2 MG/24HR TD PTWK: 0.2000 mg | MEDICATED_PATCH | TRANSDERMAL | Status: DC

## 2021-03-20 MED ORDER — CLONIDINE HCL 0.1 MG/24HR TD PTWK
0.1000 mg | MEDICATED_PATCH | TRANSDERMAL | Status: DC
  Administered 2021-03-20 – 2021-04-03 (×3): 0.1 mg via TRANSDERMAL
  Filled 2021-03-20 (×3): qty 1

## 2021-03-20 MED ORDER — NUTRISOURCE FIBER PO PACK
1.0000 | PACK | Freq: Two times a day (BID) | ORAL | Status: DC
  Administered 2021-03-20 – 2021-03-22 (×4): 1
  Filled 2021-03-20 (×4): qty 1

## 2021-03-20 MED ORDER — FREE WATER
350.0000 mL | Status: DC
  Administered 2021-03-20 – 2021-03-21 (×12): 350 mL

## 2021-03-20 MED ORDER — DEXTROSE 5 % IV SOLN: INTRAVENOUS | Status: DC

## 2021-03-20 MED ORDER — POLYETHYLENE GLYCOL 3350 17 G PO PACK: 17.0000 g | PACK | Freq: Every day | ORAL | Status: DC | PRN

## 2021-03-20 MED ORDER — FUROSEMIDE 20 MG PO TABS
20.0000 mg | ORAL_TABLET | Freq: Every day | ORAL | Status: DC
  Administered 2021-03-20: 20 mg via ORAL
  Filled 2021-03-20: qty 1

## 2021-03-20 MED ORDER — FUROSEMIDE 20 MG PO TABS
20.0000 mg | ORAL_TABLET | Freq: Every day | ORAL | Status: DC
  Administered 2021-03-21 – 2021-03-22 (×2): 20 mg
  Filled 2021-03-20 (×2): qty 1

## 2021-03-20 NOTE — Progress Notes (Addendum)
NAME:  Eddie Chapman, MRN:  [id number], DOB:  Jan 07, 1961, LOS: 84 ADMISSION DATE:  02/23/2021  Brief Pt Description / Synopsis:  60 yo morbidly obese male with acute on chronic hypoxic and hypercapnic respiratory failure due to [place] and AECOPD, along with severe toxic metabolic encephalopathy.  Failure to wean from vent requiring Tracheostomy on 03/07/2021.  History of Present Illness:   Patient has a complex comorbid history including systolic CHF chronically, pulmonary hypertension, obstructive sleep apnea, chronic hypoxemia with respiratory failure, obesity hypoventilation syndrome with thoracic restriction, hormonal dysfunction with hypotestosteronism, dyslipidemia, metabolic syndrome, previous subarachnoid hemorrhage, history of traumatic thoracic spinal injury, depression.  Patient was admitted in January to the intensive care unit with altered mental status acute hypoxemic and hypercapnic respiratory failure with metabolic encephalopathy and left lower lobe severe pneumonia.  Throughout hospitalization he was noted to have lethargy with hypercapnic encephalopathy and became unresponsive a few times even postextubation while on medical floor.  Patient has never smoked in the past.   On this admission he is poorly responsive with hypercapnic hypoxemic respiratory failure          Events:      02/24/21- Patient responded well to mechanical ventilation with improvement in hypercapnia on ABG this am.  Seems that he had THC induced apnea/hypopnea based on laboratory testing.  There is bibasilar atelectasis induced severe hypoxemia, recruitment Metaneb therapy has been ordered. Plan to repeat CXR this afternoon with SBT today.  Reviewed plan with RN and RT today.     02/25/21- patient was unable to pass SBT today. He was able to come off propofol with RASS-0 and is weaned down on levophed.     02/26/21- patient continues to require levophed, he has been difficult  to wean. I have asked ID for consultation due to concern for sepsis possibly due to CAP. Unable to perform SBT today. Family at bedside.   02/27/21- patient on PRVC with heavy secretions, difficulty weaning with high narcotic tolerance. Patient had suicide attempt with requirement for psych.   6/1 failing to wean from vent, DNR status 6/2 failed to wean from vent, severe hypoxia 6/3 severe hypoxia, failure to wean from vent, needs [facility], ENT consulted 6/4 failure to wean from  6/6 failure to wean from vent, plan for [facility] 6/7 failure to wean from vent 6/8 plan for [facility] TODAY 6/9 left foot cyanosis, vasc surgery consulted 6/10: Agitation overnight, added oxycodone, valium, seroquel.  SBT in progress.  Working with PT 6/11: Less agitated, however, did not tolerate SBT today 6/12: Dilaudid being weaned off, less agitated.  Can actually speak with trach.  Tolerating SBT 03/12/21- patient febrile, Trache aspirate sent for viral workup. Patient with anasarca on examination. Adequate UOP.  On PRVC 35%, GCS3T.  03/13/21- TCC having difficulty finding CIR placement.  There is RLE swelling and hypothermia and DVT study was ordered, he is with severe anxiety during SBT today and we reviewed MAR with addition of Abilify due to no QTC effect.   03/14/21- patient is awake, he is attempting again for trache collar trial today.  His secretions are improved, he is vocalizing and trying to write for communication.  03/15/21- patient is alert he communicates via gesturing and attempts to vocalize.  Still in withdrawal and on precedex but calm and appropriate.  Plan is to wean precedex and get to PT/OT.  03/16/21- patient is improved mentation, plan for PMV and SLP with ongoing PNMR transfer for recovery.  He will need  psych eval at some point due to suicide attempt 03/17/21- patient is speaking via gesturing and writing.  Trach changed to XLT 6.  Ongoing plan for CIR.   03/18/21- patient continue to improve, he will be  able to join PT/OT with plan to move to [facility] inpatient rehab asap.  03/19/21- SBT vs Trach collar trials during day, rest on vent at night.  Lasix x1 today 03/20/21-Pts current vent settings SBT 15/5 and tolerating well will attempt to trach collar    Micro Data:  5/27: SARS-CoV-2 and influenza PCR>> negative 5/27: Blood culture x2>> no growth 5/27: Urine>> no growth 5/27: MRSA PCR>> negative 5/28: Sputum>> normal respiratory flora 5/30: Respiratory viral panel>> negative 5/31: SPUTUM CULTURE >> ACINETOBACTER CALCOACETICUS/BAUMANNII COMPLEX  6/13: Tracheal aspirate>>ACINETOBACTER CALCOACETICUS/BAUMANNII COMPLEX      Antimicrobials:  Azithromycin 5/27>>5/29 Ceftriaxone 5/27>>5/29 Vancomycin 5/29>>5/30 Zosyn 5/29>>6/1 Unasyn 6/1>>6/10  Antibiotics Given (last 72 hours)     None      Scheduled Meds:  ARIPiprazole  5 mg Per Tube Daily   chlorhexidine gluconate (MEDLINE KIT)  15 mL Mouth Rinse BID   Chlorhexidine Gluconate Cloth  6 each Topical Daily   enoxaparin (LOVENOX) injection  0.5 mg/kg Subcutaneous Q24H   feeding supplement (PROSource TF)  90 mL Per Tube TID   fiber  1 packet Per Tube TID   free water  300 mL Per Tube Q2H   insulin aspart  0-15 Units Subcutaneous Q4H   mouth rinse  15 mL Mouth Rinse 10 times per day   pantoprazole sodium  40 mg Per Tube Daily   polyethylene glycol  17 g Per Tube Daily   senna-docusate  1 tablet Per Tube BID   sodium chloride flush  10-40 mL Intracatheter Q12H   Continuous Infusions:  sodium chloride Stopped (03/11/21 1855)   feeding supplement (VITAL 1.5 CAL) 1,000 mL (03/20/21 0650)   PRN Meds:.bisacodyl, clonazepam, docusate sodium, glycopyrrolate, hydrALAZINE, midazolam, oxyCODONE, polyethylene glycol, polyvinyl alcohol, sodium chloride flush, traZODone    Objective   Blood pressure (!) 157/96, pulse (!) 106, temperature 98.8 F (37.1 C), temperature source Oral, resp. rate 15, height '5\' 10"'  (1.778 m), weight (!) 141.5 kg,  SpO2 100 %.    Vent Mode: PRVC FiO2 (%):  [35 %] 35 % Set Rate:  [15 bmp] 15 bmp Vt Set:  [550 mL] 550 mL PEEP:  [5 cmH20] 5 cmH20 Pressure Support:  [15 cmH20] 15 cmH20 Plateau Pressure:  [17 cmH20-19 cmH20] 19 cmH20   Intake/Output Summary (Last 24 hours) at 03/20/2021 0711 Last data filed at 03/20/2021 0500 Gross per 24 hour  Intake 3197.67 ml  Output 1251 ml  Net 1946.67 ml   Filed Weights   03/15/21 0500 03/16/21 0359 03/17/21 0407  Weight: (!) 142.9 kg (!) 144.3 kg (!) 141.5 kg      REVIEW OF SYSTEMS PATIENT IS UNABLE TO PROVIDE COMPLETE REVIEW OF SYSTEMS DUE TO SEVERE CRITICAL ILLNESS AND  ENCEPHALOPATHY   PHYSICAL EXAMINATION:  GENERAL: obese male resting in bed, NAD  NECK: Supple.  No, JVD, tracheostomy midline PULMONARY: clear throughout, even, non labored  CARDIOVASCULAR: sinus tach, s1s2, no M/R/G GASTROINTESTINAL: Massively obese, soft, nontender, nondistended, no guarding or rebound tenderness, bowel sounds positive x4 MUSCULOSKELETAL: Normal bulk and tone, no deformities, 2+ edema  NEUROLOGIC: Awake and alert, moves all extremities to commands, difficult to access orientation due to trach SKIN: Warm and dry.  Bilateral feet mild erythema present. No obvious rashes, lesions, ulcerations  Assessment / Plan:  Acute on Chronic Hypoxic and Hypercapnic Respiratory Failure in the setting of ACINOTOBACTER SPECIES PNEUMONIA-completed abx course and AECOPD -Unable to wean from vent ~ S/P tracheostomy on 03/07/21 -Full vent support, implement lung protective strategies -Wean FiO2 and PEEP as tolerated to maintain O2 sats 88 to 92% -Follow intermittent chest x-ray and ABG as needed -Spontaneous breathing trial/trach collar trials as tolerated-once pt able tolerate trach collar during the day will consult speech therapy  -Bronchodilators and budesonide nebs -Pt remains over 7L positive will resume home dose of lasix 20 mg daily-06/21              ACINETOBACTER  CALCOACETICUS/BAUMANNII COMPLEX PNEUMONIA -Monitor fever curve -Trend WBCs -Follow cultures as above -s/p course of Unasyn  Septic Shock>>resolved Hypertension -Continuous cardiac monitoring -Will start clonidine patch-06/21  Hypernatremia Mild Hypokalemia-resolved  -Trend BMP -Replace electrolytes as indicated  -Monitor UOP -Increased free water flushes to 350 ml q2h  Acute toxic metabolic encephalopathy>>resolved  Reported Suicide Attempt (wife reports prior to admission patient was holding gun in his hand while in backyard and stated suicidal ideation) -Avoid sedating medications as able -Continue Abilify -PRN Klonopin, Oxycodone -Provide supportive care, encourage family presence -Promote normal sleep/wake cycle, prn trazodone  -PT/OT consults   Best practice (right click and "Reselect all SmartList Selections" daily)  Diet:  Tube Feed  Pain/Anxiety/Delirium protocol (if indicated): Yes (RASS goal 0 to -1 ) VAP protocol (if indicated): Yes DVT prophylaxis: Lovenox GI prophylaxis: PPI Glucose control:  SSI Yes Central venous access:  Yes, and it is still needed Arterial line:  N/A Foley:  N/A Mobility:  OOB as tolerated PT consulted: yes Code Status:  DNR Disposition: ICU    Labs   CBC: Recent Labs  Lab 03/16/21 0524 03/17/21 0356 03/18/21 0522 03/19/21 0513 03/20/21 0454  WBC 9.9 7.6 9.5 11.6* 9.2  NEUTROABS 7.9* 5.8 7.6 9.7* 7.2  HGB 13.0 12.3* 12.7* 12.5* 11.5*  HCT 41.4 40.4 41.1 40.5 38.5*  MCV 104.5* 105.8* 105.7* 105.5* 108.5*  PLT 206 201 210 197 383    Basic Metabolic Panel: Recent Labs  Lab 03/15/21 0507 03/16/21 0524 03/17/21 0356 03/18/21 0522 03/19/21 0513 03/20/21 0454  NA 154*  --  154* 157* 155* 154*  K 3.9  --  3.4* 3.1* 3.4* 3.7  CL 114*  --  116* 118* 114* 112*  CO2 33*  --  36* 32 33* 35*  GLUCOSE 125*  --  162* 124* 146* 145*  BUN 37*  --  46* 51* 31* 31*  CREATININE 1.29*  --  1.37* 1.41* 1.04 0.94  CALCIUM 9.3  --   8.9 8.7* 8.6* 8.6*  MG 2.4 2.3 2.3 2.4 2.4 2.5*  PHOS 3.5 3.4 4.2 3.8 2.6  --    GFR: Estimated Creatinine Clearance: 117.2 mL/min (by C-G formula based on SCr of 0.94 mg/dL). Recent Labs  Lab 03/17/21 0356 03/18/21 0522 03/19/21 0513 03/20/21 0454  WBC 7.6 9.5 11.6* 9.2    Liver Function Tests: Recent Labs  Lab 03/17/21 0356  ALBUMIN 3.3*   No results for input(s): LIPASE, AMYLASE in the last 168 hours. No results for input(s): AMMONIA in the last 168 hours.  ABG    Component Value Date/Time   PHART 7.40 03/02/2021 2000   PCO2ART 69 (HH) 03/02/2021 2000   PO2ART 137 (H) 03/02/2021 2000   HCO3 42.7 (H) 03/02/2021 2000   O2SAT 99.1 03/02/2021 2000      Coagulation Profile: No results for input(s): INR, PROTIME in  the last 168 hours.  Cardiac Enzymes: No results for input(s): CKTOTAL, CKMB, CKMBINDEX, TROPONINI in the last 168 hours.  HbA1C: Hgb A1c MFr Bld  Date/Time Value Ref Range Status  03/02/2021 04:52 AM 5.9 (H) 4.8 - 5.6 % Final    Comment:    (NOTE)         Prediabetes: 5.7 - 6.4         Diabetes: >6.4         Glycemic control for adults with diabetes: <7.0     CBG: Recent Labs  Lab 03/19/21 1103 03/19/21 1530 03/19/21 1929 03/19/21 2319 03/20/21 0343  GLUCAP 155* 132* 148* 116* 125*    Allergies Allergies  Allergen Reactions   Divalproex Sodium Other (See Comments)    Elevated liver enzymes   Valproic Acid     unknown     Rosilyn Mings, [place] Pager [phone] (please enter 7 digits) PCCM Consult Pager [phone] (please enter 7 digits)

## 2021-03-20 NOTE — Progress Notes (Signed)
Pt placed on ATC at this time. Pt able to expectorate secretions at this time.

## 2021-03-20 NOTE — Plan of Care (Signed)
  Problem: Activity: Goal: Risk for activity intolerance will decrease Outcome: Progressing  Pt has uneventful night, continued on full vent support all night, PRN given twice for restlessness. Cont on tube feeding with 377mls/2hrs. Nais 154 this morning. Girlfriend was updated last night over the phone.

## 2021-03-20 NOTE — Progress Notes (Signed)
PT Cancellation Note  Patient Details Name: Eddie Chapman MRN: [id number] DOB: 04-01-1961   Cancelled Treatment:    Reason Eval/Treat Not Completed: Other (comment). Per RN, pt with recent transition to trach collar, per RN pt okay for  light activity. PT to attempt as able.  Lieutenant Diego PT, DPT 1:19 PM,03/20/21

## 2021-03-20 NOTE — Progress Notes (Signed)
OT Cancellation Note  Patient Details Name: Eddie Chapman MRN: [id number] DOB: June 06, 1961   Cancelled Treatment:    Reason Eval/Treat Not Completed: Fatigue/lethargy limiting ability to participate;Other (comment). Per RN, pt with recent transition to trach collar, per RN pt okay for  light activity. Upon attempt, pt sleeping soundly. BP, HR, RR elevated. Will hold OT tx this date to allow additional time to transition to trach collar and re-attempt next date as medically appropriate.   Hanley Hays, MPH, MS, OTR/L ascom [phone] 03/20/21, 2:44 PM

## 2021-03-20 NOTE — Progress Notes (Signed)
PHARMACY CONSULT NOTE  Pharmacy Consult for Electrolyte Monitoring and Replacement   Recent Labs: Potassium (mmol/L)  Date Value  03/20/2021 3.7  05/04/2014 4.3   Magnesium (mg/dL)  Date Value  03/20/2021 2.5 (H)   Calcium (mg/dL)  Date Value  03/20/2021 8.6 (L)   Calcium, Total (mg/dL)  Date Value  05/04/2014 9.1   Albumin (g/dL)  Date Value  03/17/2021 3.3 (L)   Phosphorus (mg/dL)  Date Value  03/19/2021 2.6   Sodium (mmol/L)  Date Value  03/20/2021 154 (H)  05/04/2014 140   Corrected Ca: 9.3 mg/dL  Assessment: 60 y/o male with a h/o HTN, HLD, OSA, CHF, MDD, GERD, cerebral aneurysm and pulmonary HTN who is admitted with CAP, COPD exacerbation and encephalopathy, s/p tracheostomy and NGT 6/8  Nutrition: vital at 65 mL/hr, PROSource 90 mL QID>TID  Na: 155>154 (on free water 300>357mL q2h) K: 3.4>3.7 (25meq x1 6/20) (on lasix 20mg  PO)  Goal of Therapy:  Electrolytes WNL  Plan:  Hypernatremia: free water increased to 350 mL per tube q2h by NP Other lytes WNL; no further replacement today. Recheck electrolytes in AM   Lorna Dibble ,PharmD Clinical Pharmacist 03/20/2021 9:33 AM

## 2021-03-21 LAB — CBC WITH DIFFERENTIAL/PLATELET
Abs Immature Granulocytes: 0.09 10*3/uL — ABNORMAL HIGH (ref 0.00–0.07)
Basophils Absolute: 0 10*3/uL (ref 0.0–0.1)
Basophils Relative: 0 %
Eosinophils Absolute: 0.1 10*3/uL (ref 0.0–0.5)
Eosinophils Relative: 1 %
HCT: 33.9 % — ABNORMAL LOW (ref 39.0–52.0)
Hemoglobin: 10.9 g/dL — ABNORMAL LOW (ref 13.0–17.0)
Immature Granulocytes: 1 %
Lymphocytes Relative: 11 %
Lymphs Abs: 1 10*3/uL (ref 0.7–4.0)
MCH: 33.4 pg (ref 26.0–34.0)
MCHC: 32.2 g/dL (ref 30.0–36.0)
MCV: 104 fL — ABNORMAL HIGH (ref 80.0–100.0)
Monocytes Absolute: 1 10*3/uL (ref 0.1–1.0)
Monocytes Relative: 11 %
Neutro Abs: 6.9 10*3/uL (ref 1.7–7.7)
Neutrophils Relative %: 76 %
Platelets: 147 10*3/uL — ABNORMAL LOW (ref 150–400)
RBC: 3.26 MIL/uL — ABNORMAL LOW (ref 4.22–5.81)
RDW: 13.2 % (ref 11.5–15.5)
WBC: 9.1 10*3/uL (ref 4.0–10.5)
nRBC: 0 % (ref 0.0–0.2)

## 2021-03-21 LAB — BASIC METABOLIC PANEL
Anion gap: 6 (ref 5–15)
Anion gap: 7 (ref 5–15)
BUN: 21 mg/dL (ref 8–23)
BUN: 22 mg/dL (ref 8–23)
CO2: 33 mmol/L — ABNORMAL HIGH (ref 22–32)
CO2: 35 mmol/L — ABNORMAL HIGH (ref 22–32)
Calcium: 7.9 mg/dL — ABNORMAL LOW (ref 8.9–10.3)
Calcium: 8.3 mg/dL — ABNORMAL LOW (ref 8.9–10.3)
Chloride: 99 mmol/L (ref 98–111)
Chloride: 103 mmol/L (ref 98–111)
Creatinine, Ser: 0.89 mg/dL (ref 0.61–1.24)
Creatinine, Ser: 0.93 mg/dL (ref 0.61–1.24)
GFR, Estimated: >60 mL/min (ref >60)
GFR, Estimated: >60 mL/min (ref >60)
Glucose, Bld: 449 mg/dL — ABNORMAL HIGH (ref 70–99)
Glucose, Bld: 156 mg/dL — ABNORMAL HIGH (ref 70–99)
Potassium: 3.1 mmol/L — ABNORMAL LOW (ref 3.5–5.1)
Potassium: 3.5 mmol/L (ref 3.5–5.1)
Sodium: 138 mmol/L (ref 135–145)
Sodium: 145 mmol/L (ref 135–145)

## 2021-03-21 LAB — GLUCOSE, CAPILLARY
Glucose-Capillary: 105 mg/dL — ABNORMAL HIGH (ref 70–99)
Glucose-Capillary: 123 mg/dL — ABNORMAL HIGH (ref 70–99)
Glucose-Capillary: 124 mg/dL — ABNORMAL HIGH (ref 70–99)
Glucose-Capillary: 127 mg/dL — ABNORMAL HIGH (ref 70–99)
Glucose-Capillary: 137 mg/dL — ABNORMAL HIGH (ref 70–99)
Glucose-Capillary: 158 mg/dL — ABNORMAL HIGH (ref 70–99)
Glucose-Capillary: 164 mg/dL — ABNORMAL HIGH (ref 70–99)

## 2021-03-21 LAB — PHOSPHORUS: Phosphorus: 2.3 mg/dL — ABNORMAL LOW (ref 2.5–4.6)

## 2021-03-21 LAB — MAGNESIUM: Magnesium: 1.8 mg/dL (ref 1.7–2.4)

## 2021-03-21 MED ORDER — POTASSIUM PHOSPHATES 15 MMOLE/5ML IV SOLN
15.0000 mmol | Freq: Once | INTRAVENOUS | Status: AC
  Administered 2021-03-21: 15 mmol via INTRAVENOUS
  Filled 2021-03-21: qty 5

## 2021-03-21 MED ORDER — FREE WATER
300.0000 mL | Status: DC
  Administered 2021-03-21: 300 mL

## 2021-03-21 MED ORDER — METOLAZONE 2.5 MG PO TABS
2.5000 mg | ORAL_TABLET | Freq: Once | ORAL | Status: AC
  Administered 2021-03-21: 2.5 mg
  Filled 2021-03-21: qty 1

## 2021-03-21 MED ORDER — METOPROLOL TARTRATE 25 MG PO TABS
25.0000 mg | ORAL_TABLET | Freq: Two times a day (BID) | ORAL | Status: DC
  Administered 2021-03-21 – 2021-03-26 (×9): 25 mg
  Filled 2021-03-21 (×9): qty 1

## 2021-03-21 MED ORDER — FREE WATER
300.0000 mL | Status: DC
  Administered 2021-03-21 – 2021-03-22 (×9): 300 mL

## 2021-03-21 MED ORDER — POTASSIUM CHLORIDE 20 MEQ PO PACK
20.0000 meq | PACK | ORAL | Status: AC
  Administered 2021-03-21 (×2): 20 meq
  Filled 2021-03-21 (×2): qty 1

## 2021-03-21 MED ORDER — METOPROLOL TARTRATE 25 MG PO TABS: 25.0000 mg | ORAL_TABLET | Freq: Two times a day (BID) | ORAL | Status: DC

## 2021-03-21 MED ORDER — POTASSIUM CHLORIDE 20 MEQ PO PACK
40.0000 meq | PACK | Freq: Once | ORAL | Status: AC
  Administered 2021-03-21: 40 meq
  Filled 2021-03-21: qty 2

## 2021-03-21 NOTE — Progress Notes (Signed)
NAME:  Eddie Chapman, MRN:  [id number], DOB:  [birth year], LOS: 2 ADMISSION DATE:  02/23/2021  Brief Pt Description / Synopsis:  60 yo morbidly obese male with acute on chronic hypoxic and hypercapnic respiratory failure due to [place] and AECOPD, along with severe toxic metabolic encephalopathy.  Failure to wean from vent requiring Tracheostomy on 03/07/2021.  History of Present Illness:   Patient has a complex comorbid history including systolic CHF chronically, pulmonary hypertension, obstructive sleep apnea, chronic hypoxemia with respiratory failure, obesity hypoventilation syndrome with thoracic restriction, hormonal dysfunction with hypotestosteronism, dyslipidemia, metabolic syndrome, previous subarachnoid hemorrhage, history of traumatic thoracic spinal injury, depression.  Patient was admitted in January to the intensive care unit with altered mental status acute hypoxemic and hypercapnic respiratory failure with metabolic encephalopathy and left lower lobe severe pneumonia.  Throughout hospitalization he was noted to have lethargy with hypercapnic encephalopathy and became unresponsive a few times even postextubation while on medical floor.  Patient has never smoked in the past.   On this admission he is poorly responsive with hypercapnic hypoxemic respiratory failure          Events:      02/24/21- Patient responded well to mechanical ventilation with improvement in hypercapnia on ABG this am.  Seems that he had THC induced apnea/hypopnea based on laboratory testing.  There is bibasilar atelectasis induced severe hypoxemia, recruitment Metaneb therapy has been ordered. Plan to repeat CXR this afternoon with SBT today.  Reviewed plan with RN and RT today.     02/25/21- patient was unable to pass SBT today. He was able to come off propofol with RASS-0 and is weaned down on levophed.     02/26/21- patient continues to require levophed, he has been difficult  to wean. I have asked ID for consultation due to concern for sepsis possibly due to CAP. Unable to perform SBT today. Family at bedside.   02/27/21- patient on PRVC with heavy secretions, difficulty weaning with high narcotic tolerance. Patient had suicide attempt with requirement for psych.   6/1 failing to wean from vent, DNR status 6/2 failed to wean from vent, severe hypoxia 6/3 severe hypoxia, failure to wean from vent, needs [facility], ENT consulted 6/4 failure to wean from  6/6 failure to wean from vent, plan for [facility] 6/7 failure to wean from vent 6/8 plan for [facility] TODAY 6/9 left foot cyanosis, vasc surgery consulted 6/10: Agitation overnight, added oxycodone, valium, seroquel.  SBT in progress.  Working with PT 6/11: Less agitated, however, did not tolerate SBT today 6/12: Dilaudid being weaned off, less agitated.  Can actually speak with trach.  Tolerating SBT 03/12/21- patient febrile, Trache aspirate sent for viral workup. Patient with anasarca on examination. Adequate UOP.  On PRVC 35%, GCS3T.  03/13/21- TCC having difficulty finding CIR placement.  There is RLE swelling and hypothermia and DVT study was ordered, he is with severe anxiety during SBT today and we reviewed MAR with addition of Abilify due to no QTC effect.   03/14/21- patient is awake, he is attempting again for trache collar trial today.  His secretions are improved, he is vocalizing and trying to write for communication.  03/15/21- patient is alert he communicates via gesturing and attempts to vocalize.  Still in withdrawal and on precedex but calm and appropriate.  Plan is to wean precedex and get to PT/OT.  03/16/21- patient is improved mentation, plan for PMV and SLP with ongoing PNMR transfer for recovery.  He will need  psych eval at some point due to suicide attempt 03/17/21- patient is speaking via gesturing and writing.  Trach changed to XLT 6.  Ongoing plan for CIR.   03/18/21- patient continue to improve, he will be  able to join PT/OT with plan to move to [facility] inpatient rehab asap.  03/19/21- SBT vs Trach collar trials during day, rest on vent at night.  Lasix x1 today 03/20/21-Pts current vent settings SBT 15/5 and tolerating well will attempt to trach collar  03/21/21- Tolerating Trach collar trials, work with PT/OT   Micro Data:  5/27: SARS-CoV-2 and influenza PCR>> negative 5/27: Blood culture x2>> no growth 5/27: Urine>> no growth 5/27: MRSA PCR>> negative 5/28: Sputum>> normal respiratory flora 5/30: Respiratory viral panel>> negative 5/31: SPUTUM CULTURE >> ACINETOBACTER CALCOACETICUS/BAUMANNII COMPLEX  6/13: Tracheal aspirate>>ACINETOBACTER CALCOACETICUS/BAUMANNII COMPLEX      Antimicrobials:  Azithromycin 5/27>>5/29 Ceftriaxone 5/27>>5/29 Vancomycin 5/29>>5/30 Zosyn 5/29>>6/1 Unasyn 6/1>>6/10  Antibiotics Given (last 72 hours)     None      Scheduled Meds:  ARIPiprazole  5 mg Per Tube Daily   chlorhexidine gluconate (MEDLINE KIT)  15 mL Mouth Rinse BID   Chlorhexidine Gluconate Cloth  6 each Topical Daily   cloNIDine  0.1 mg Transdermal Weekly   enoxaparin (LOVENOX) injection  0.5 mg/kg Subcutaneous Q24H   feeding supplement (PROSource TF)  90 mL Per Tube TID   fiber  1 packet Per Tube BID   free water  300 mL Per Tube Q2H   furosemide  20 mg Per Tube Daily   insulin aspart  0-15 Units Subcutaneous Q4H   mouth rinse  15 mL Mouth Rinse 10 times per day   pantoprazole sodium  40 mg Per Tube Daily   polyethylene glycol  17 g Per Tube Daily   potassium chloride  20 mEq Per Tube Q4H   senna-docusate  1 tablet Per Tube BID   sodium chloride flush  10-40 mL Intracatheter Q12H   Continuous Infusions:  sodium chloride Stopped (03/11/21 1855)   dextrose 75 mL/hr at 03/21/21 0800   feeding supplement (VITAL 1.5 CAL) 1,000 mL (03/20/21 0650)   potassium PHOSPHATE IVPB (in mmol) 15 mmol (03/21/21 0850)   PRN Meds:.bisacodyl, clonazepam, docusate sodium, glycopyrrolate,  hydrALAZINE, labetalol, midazolam, oxyCODONE, polyethylene glycol, polyvinyl alcohol, sodium chloride flush, traZODone    Objective   Blood pressure (!) 162/96, pulse 88, temperature 99.4 F (37.4 C), temperature source Oral, resp. rate (!) 30, height 5' 10" (1.778 m), weight (!) 148.5 kg, SpO2 100 %.    Vent Mode: PRVC FiO2 (%):  [28 %-40 %] 28 % Set Rate:  [15 bmp] 15 bmp Vt Set:  [550 mL] 550 mL PEEP:  [5 cmH20] 5 cmH20 Pressure Support:  [5 cmH20] 5 cmH20   Intake/Output Summary (Last 24 hours) at 03/21/2021 1006 Last data filed at 03/21/2021 0800 Gross per 24 hour  Intake 9247.04 ml  Output 1750 ml  Net 7497.04 ml    Filed Weights   03/16/21 0359 03/17/21 0407 03/21/21 0500  Weight: (!) 144.3 kg (!) 141.5 kg (!) 148.5 kg      REVIEW OF SYSTEMS PATIENT IS UNABLE TO PROVIDE COMPLETE REVIEW OF SYSTEMS DUE TO SEVERE CRITICAL ILLNESS AND  ENCEPHALOPATHY   PHYSICAL EXAMINATION:  GENERAL: obese male sitting in bed, on trach collar trial,  NAD  NECK: Supple.  No JVD, tracheostomy midline PULMONARY: clear breath sounds throughout, no wheezing or rales noted, even, non labored  CARDIOVASCULAR: Regular rate and rhythm (NSR on   telemetry), s1s2, no M/R/G GASTROINTESTINAL: Massively obese, soft, nontender, nondistended, no guarding or rebound tenderness, bowel sounds positive x4 MUSCULOSKELETAL: Normal bulk and tone, no deformities, 2+ edema BLE  NEUROLOGIC: Awake and alert, moves all extremities to commands,  no focal deficits, difficult to access orientation due to trach SKIN: Warm and dry.  Bilateral feet mild erythema present. No obvious rashes, lesions, ulcerations  Assessment / Plan:    Acute on Chronic Hypoxic and Hypercapnic Respiratory Failure in the setting of ACINOTOBACTER SPECIES PNEUMONIA-completed abx course and AECOPD -S/P tracheostomy on 03/07/21 -Wean FiO2 and PEEP as tolerated to maintain O2 sats 88 to 92% -Spontaneous breathing trial/trach collar trials as  tolerated-once pt able tolerate trach collar during the day will consult speech therapy  -Rest on Ventilator during night -Follow intermittent CXR and ABG as needed -Bronchodilators and budesonide nebs -Continue home dose of lasix 20 mg daily              ACINETOBACTER CALCOACETICUS/BAUMANNII COMPLEX PNEUMONIA>>treated -Monitor fever curve -Trend WBCs -Follow cultures as above -s/p course of Unasyn  Septic Shock>>resolved Hypertension -Continuous cardiac monitoring -Continue clonidine patch-06/21  Hypernatremia>>improving Mild Hypokalemia-resolved  -Monitor I&O's / urinary output -Follow BMP -Ensure adequate renal perfusion -Avoid nephrotoxic agents as able -Replace electrolytes as indicated -Continue free water flushes to 300 ml q2h & D5W @ 75 ml/hr  Acute toxic metabolic encephalopathy>>resolved  Reported Suicide Attempt (wife reports prior to admission patient was holding gun in his hand while in backyard and stated suicidal ideation) -Avoid sedating medications as able -Continue Abilify -PRN Klonopin, Oxycodone -Provide supportive care, encourage family presence -Promote normal sleep/wake cycle, prn trazodone  -PT/OT consults   Best practice (right click and "Reselect all SmartList Selections" daily)  Diet:  Tube Feed  Pain/Anxiety/Delirium protocol (if indicated): Yes (RASS goal 0 to -1 ) VAP protocol (if indicated): Yes DVT prophylaxis: Lovenox GI prophylaxis: PPI Glucose control:  SSI Yes Central venous access:  Yes, and it is still needed Arterial line:  N/A Foley:  N/A Mobility:  OOB as tolerated PT consulted: yes Code Status:  DNR Disposition: ICU    Labs   CBC: Recent Labs  Lab 03/17/21 0356 03/18/21 0522 03/19/21 0513 03/20/21 0454 03/21/21 0444  WBC 7.6 9.5 11.6* 9.2 9.1  NEUTROABS 5.8 7.6 9.7* 7.2 6.9  HGB 12.3* 12.7* 12.5* 11.5* 10.9*  HCT 40.4 41.1 40.5 38.5* 33.9*  MCV 105.8* 105.7* 105.5* 108.5* 104.0*  PLT 201 210 197 168 147*      Basic Metabolic Panel: Recent Labs  Lab 03/16/21 0524 03/17/21 0356 03/18/21 0522 03/19/21 0513 03/20/21 0454 03/21/21 0444 03/21/21 0755  NA  --  154* 157* 155* 154* 138 145  K  --  3.4* 3.1* 3.4* 3.7 3.1* 3.5  CL  --  116* 118* 114* 112* 99 103  CO2  --  36* 32 33* 35* 33* 35*  GLUCOSE  --  162* 124* 146* 145* 449* 156*  BUN  --  46* 51* 31* 31* 21 22  CREATININE  --  1.37* 1.41* 1.04 0.94 0.89 0.93  CALCIUM  --  8.9 8.7* 8.6* 8.6* 7.9* 8.3*  MG 2.3 2.3 2.4 2.4 2.5* 1.8  --   PHOS 3.4 4.2 3.8 2.6  --  2.3*  --     GFR: Estimated Creatinine Clearance: 121.8 mL/min (by C-G formula based on SCr of 0.93 mg/dL). Recent Labs  Lab 03/18/21 0522 03/19/21 0513 03/20/21 0454 03/21/21 0444  WBC 9.5 11.6* 9.2 9.1  Liver Function Tests: Recent Labs  Lab 03/17/21 0356  ALBUMIN 3.3*    No results for input(s): LIPASE, AMYLASE in the last 168 hours. No results for input(s): AMMONIA in the last 168 hours.  ABG    Component Value Date/Time   PHART 7.40 03/02/2021 2000   PCO2ART 69 (HH) 03/02/2021 2000   PO2ART 137 (H) 03/02/2021 2000   HCO3 42.7 (H) 03/02/2021 2000   O2SAT 99.1 03/02/2021 2000      Coagulation Profile: No results for input(s): INR, PROTIME in the last 168 hours.  Cardiac Enzymes: No results for input(s): CKTOTAL, CKMB, CKMBINDEX, TROPONINI in the last 168 hours.  HbA1C: Hgb A1c MFr Bld  Date/Time Value Ref Range Status  03/02/2021 04:52 AM 5.9 (H) 4.8 - 5.6 % Final    Comment:    (NOTE)         Prediabetes: 5.7 - 6.4         Diabetes: >6.4         Glycemic control for adults with diabetes: <7.0     CBG: Recent Labs  Lab 03/20/21 1531 03/20/21 1923 03/20/21 2355 03/21/21 0427 03/21/21 0728  GLUCAP 142* 135* 105* 158* 137*     Allergies Allergies  Allergen Reactions   Divalproex Sodium Other (See Comments)    Elevated liver enzymes   Valproic Acid     unknown    Critical Care Time: 30 minutes   Darel Hong,  AGACNP-BC [facility] Prefer epic messenger for cross cover needs If after hours, please call E-link

## 2021-03-21 NOTE — TOC Progression Note (Signed)
Transition of Care [facility]) - Progression Note    Patient Details  Name: Eddie Chapman MRN: [id number] Date of Birth: 1961/07/30  Transition of Care [facility]) CM/SW [place], [place] Phone Number: [phone] 03/21/2021, 11:40 AM  Clinical Narrative:     Patient tolerating trach collar and pending PT/OT consults.  Discharge plan is for patient is [facility].  [facility] requires patient to have cuffless trach w/ patient's support system trained on trach care, and a final nutrition plan, for placement.        Expected Discharge Plan and Services                                                 Social Determinants of Health (SDOH) Interventions    Readmission Risk Interventions No flowsheet data found.

## 2021-03-21 NOTE — Evaluation (Signed)
Passy-Muir Speaking Valve - Evaluation Patient Details  Name: Eddie Chapman MRN: [id number] Date of Birth: 1960/11/16  Today's Date: 03/21/2021 Time: 12-1453 SLP Time Calculation (min) (ACUTE ONLY): 60 min  Past Medical History:  Past Medical History:  Diagnosis Date   Adenomatous colon polyp    Depression    Ear drum perforation    left x2    GERD (gastroesophageal reflux disease)    Hyperlipidemia    Hypertension    T8 vertebral fracture [facility])    Past Surgical History:  Past Surgical History:  Procedure Laterality Date   CARPAL TUNNEL RELEASE     left hand   COLONOSCOPY  09/18/2009, 09/15/2014   COLONOSCOPY WITH PROPOFOL N/A 12/29/2017   Procedure: COLONOSCOPY WITH PROPOFOL;  Surgeon: Manya Silvas, MD;  Location: [facility] ENDOSCOPY;  Service: Endoscopy;  Laterality: N/A;   EYE SURGERY     FRACTURE SURGERY     HERNIA REPAIR     LIPOMA EXCISION     SPINE SURGERY     TRACHEOSTOMY TUBE PLACEMENT N/A 03/07/2021   Procedure: TRACHEOSTOMY;  Surgeon: Margaretha Sheffield, MD;  Location: [facility] ORS;  Service: ENT;  Laterality: N/A;   HPI:  Pt is a 60 yo M with a complex medical history including hx of cerebral aneurysm, PVD, HFrEF, pulmonary htn, morbid Obesity, OSA, GERD, hx of TBI, w/ recent admit in 09/2020 when he was found Hypoxic and unresponsive. Pt has slower mentation and decision making -- this is Baseline s/p accident when he fell off a roof in 2016 per GF and Father.  Pt was admitted to [facility] ED on 02/23/21 with c/o of confusion & SOB for at least 3 days. Pt was admitted to CCU for ARF. He was orally intubated on 02/28/2021; tracheostomy on 03/07/21 w/ downsize to a #6 shiley XLT on 03/17/2021; trach collar wean initiated/tolerated on 03/20/2021. Current NGT in place for enteral feedings.   Assessment / Plan / Recommendation Clinical Impression  Pt seen for initial assessment of toleration for wear/use of Passy-muir valve(PMV) today. Pt has had a lengthy hospitalization and difficulty w/  tracheal secretions; agitation/confusion. Pt does have Baseline Cognitive decline sec. to TBI in 2016 per Baseline History and family report. Pt's trach was recently downsized to a Shiley #6, XLT, Cuffed on 03/20/2021. He has tolerated trach collar weans x2 days w/ O2 support at 28% FiO2 per NSG/RT. Pt's cuff was partially deflated at Baseline; this SLP fully deflated the cuff for this assessment today. NSG updated. Pt is on nocturnal vent support thus the need for a Cuffed trach. Tracheal secretions minimal w/ PRN suctioning. Pt resting in bed. GF present. Pt engaged in gestures and mouthing for communication.  Explained the use and wear of the PMV to pt/Wife; trach and stoma area inspected; ensured Cuff was deflated. RR: 19-41, O2 sats 98-99%, HR 84-88. Finger occlusion trials revealed strong, appropriate phonations. W/ cuff deflation tolerated, PMV was placed to allow pt to use verbal engagement more naturally. Immediately post placement, pt coughed, cleared min secretions orally. Pt was able to immediately and naturally respond to this SLP/GF w/ easy verbal communication. Though min anxiousness noted; pt was easily distracted. Verbalizations and conversation were c/b adequate in volume and intelligibility when adequate breath support was present, though often when pt used rushed, run-on speech, decreased breath support w/ dysphonia noted. Encouraged pt to focus on slowing down and taking his time to share thoughts and take extra breaths in order to support his speech/volume. Continued to note  intermittent loss of breath support during conversation w/ GF in telling her something. PMV placement was tolerated for ~30 mins w/out noted O2 desaturation, or significant change in RR/HR(RR fluctuated still into the 30s). No overt discomfort noted in his respiratory effort -- pt stated it felt "ok". Min++ use of accessary muscles during any exertion/talking; no distress was noted in his breathing pattern. Pt was often  encouraged to use easy, slow breathing -- he does have a Baseline of anxiety per GF. PMV was removed at ~30 mins to allow pt to rest.    Pt appears to adequately tolerate PMV placement w/out overt respiratory discomfort or distress; though increased RR at times. ANS remained at his baseilne during wear/use. Suspect Shiley trach size, #6, is beneficial for comfortable PMV wear/use.  Much education was given on PMV use/wear, MUST have Cuff deflation for PMV wear, checking and removing the air from the balloon b/f placing, placing/removing the PMV, and care of the PMV. Discussed that is MUST be worn for all eating/drinking.   ST services will f/u w/ another PMV assessment/training/education session tomorrow along w/ po trials assessment then recommendations will be made on more Independent use. NSG and MD updated. Plan discussed w/ pt/GF and NSG/MD. ST will f/u tomorrow. SLP Visit Diagnosis: Aphonia (R49.1) (sec. to tracheostomy)    SLP Assessment  Patient needs continued Speech Lanaguage Pathology Services    Follow Up Recommendations  Inpatient Rehab (TBD)    Frequency and Duration min 3x week  2 weeks    PMSV Trial PMSV was placed for: 30 mins Able to redirect subglottic air through upper airway: Yes Able to Attain Phonation: Yes Voice Quality: Low vocal intensity (min rushed w/ reduced coordination of breath support for speech; suspect min impact from Baseline TBI and Cognitive status) Able to Expectorate Secretions: Yes Level of Secretion Expectoration with PMSV: Oral Breath Support for Phonation: Mildly decreased Intelligibility: Intelligible Respirations During Trial: (!) 36 SpO2 During Trial: 98 % Pulse During Trial: 88 Behavior: Alert;Anxious;Cooperative;Responsive to questions;Expresses self well    Tracheostomy Tube  Additional Tracheostomy Tube Assessment Trach Collar Period: 6+ hours today Secretion Description: min+ Frequency of Tracheal Suctioning: PRN Level of  Secretion Expectoration: Tracheal    Vent Dependency  Vent Dependent: Yes (night) FiO2 (%): 28 % Weaning Trials: Yes Nocturnal Vent: Yes    Cuff Deflation Trial  GO Tolerated Cuff Deflation: Yes Length of Time for Cuff Deflation Trial: ~20 mins Behavior: Alert;Anxious;Cooperative;Smiling;Responsive to questions (distracted) Cuff Deflation Trial - Comments: ~20 mins - tolerated well        Tamarra Geiselman 03/21/2021, 5:10 PM Orinda Kenner, MS, [place] Speech Language Pathologist Rehab Services [phone]

## 2021-03-21 NOTE — Evaluation (Signed)
Occupational Therapy Re-Evaluation Patient Details Name: Eddie Chapman MRN: [id number] DOB: 07-06-1961 Today's Date: 03/21/2021    History of Present Illness Eddie Chapman is a 60 y.o. with a complex medical history including systolic CHF chronically, pulmonary hypertension, obstructive sleep apnea, chronic hypoxemia with respiratory failure, obesity hypoventilation syndrome with thoracic restriction, hormonal dysfunction with hypotestosteronism, dyslipidemia, metabolic syndrome, previous subarachnoid hemorrhage, history of traumatic thoracic spinal injury, & depression. He presented to [facility] ED on 02/23/21 with c/o of confusion & SOB for at least 3 days. Pt was admitted to CCU for ARF. he failed extubation and had trach placed on 03/07/21.   Clinical Impression   Pt seen for OT Re-evaluation this date in setting of prolonged hospitalization d/t respiratory failure. Pt is on trach collar this date at 5Lpm on 28% fiO2. Pt is somewhat more fatigued and somewhat self-limiting this date, but with encouragement, he does finally agree to performing oral care in sitting. Pt with limited tolerance for sitting requiring MOD/MAX A +2 to come to sitting and requiring MOD A +2 to sustain static sitting balance. Pt with limited tolerance for EOB sitting of only 1 minute. Pt ultimately not able to complete and ADL in sitting d/t limited tolerance for sitting. Pt returned to supine with MAX A +2 and requires MAX A +2 for propulsion towards HOB. Pt left in bed with all needs met and in reach. Will continue to follow acutely, and continue to anticipate that pt will benefit from acute rehab follow up. However, if pt's tolerance for therapy services does not improve, may need to consider alternative discharge recommendations.   Follow Up Recommendations  CIR    Equipment Recommendations  Other (comment) (defer to next venue)    Recommendations for Other Services       Precautions / Restrictions  Precautions Precautions: Fall Precaution Comments: ventilated with trach Restrictions Weight Bearing Restrictions: No      Mobility Bed Mobility Overal bed mobility: Needs Assistance Bed Mobility: Sit to Supine;Supine to Sit     Supine to sit: +2 for physical assistance;HOB elevated;+2 for safety/equipment;Mod assist;Max assist Sit to supine: Max assist;+2 for physical assistance;+2 for safety/equipment   General bed mobility comments: increased assist for mobility this date d/t pt with decreased motivation/somewhat fatigued.    Transfers                 General transfer comment: deferred    Balance Overall balance assessment: Needs assistance Sitting-balance support: Feet supported;Bilateral upper extremity supported Sitting balance-Leahy Scale: Fair Sitting balance - Comments: increased assist for seated balance this date, seemingly more fatigued/less motivated than in previous sessions       Standing balance comment: deferred                           ADL either performed or assessed with clinical judgement   ADL                                               Vision Patient Visual Report: No change from baseline       Perception     Praxis      Pertinent Vitals/Pain Pain Assessment: Faces Faces Pain Scale: Hurts a little bit Pain Location: intermittently with bed mobility Pain Descriptors / Indicators: Grimacing;Guarding Pain Intervention(s): Limited activity within patient's tolerance;Monitored  during session;Repositioned     Hand Dominance Right   Extremity/Trunk Assessment Upper Extremity Assessment Upper Extremity Assessment: Generalized weakness;Difficult to assess due to impaired cognition   Lower Extremity Assessment Lower Extremity Assessment: Difficult to assess due to impaired cognition;Generalized weakness       Communication Communication Communication: Tracheostomy   Cognition Arousal/Alertness:  Awake/alert Behavior During Therapy: WFL for tasks assessed/performed Overall Cognitive Status: Impaired/Different from baseline Area of Impairment: Safety/judgement                 Orientation Level: Disoriented to;Time;Situation Current Attention Level: Alternating Memory: Decreased short-term memory Following Commands: Follows one step commands consistently Safety/Judgement: Decreased awareness of safety;Decreased awareness of deficits Awareness: Anticipatory;Emergent Problem Solving: Requires verbal cues;Requires tactile cues General Comments: improved communication noted, pt able to write on paper with clip board,cued for activity pacing and listened well; some education required around safety and inability to drink right now   General Comments       Exercises Other Exercises Other Exercises: OT engages pt in sup to sit with intention of engaging pt in seated ADLs. Pt with very minimal tolerance for EOB sitting this date, ~1 min with encouragement   Shoulder Instructions      Home Living Family/patient expects to be discharged to:: Private residence Living Arrangements: Spouse/significant other (girlfriend) Available Help at Discharge: Family;Available 24 hours/day Type of Home: House Home Access: Stairs to enter [employer] of Steps: 2 Entrance Stairs-Rails: None Home Layout: One level     Bathroom Shower/Tub: Teacher, early years/pre: Standard     Home Equipment: [employer] - single point (rollator)          Prior Functioning/Environment Level of Independence: Needs assistance  Gait / Transfers Assistance Needed: rollator for gait in home PRN, SPC for community use PRN, pt is able to ambulate around small store (ex: [place]) but not larger store (ex: [employer]) 2/2 chronic heart issues ADL's / Homemaking Assistance Needed: Per SO, pt requires occsional assistance for LB access during ADL management.   Comments: Mod indep with SPC for  household distances; no home O2.        OT Problem List: Decreased strength;Decreased coordination;Cardiopulmonary status limiting activity;Decreased range of motion;Decreased cognition;Decreased activity tolerance;Decreased safety awareness;Impaired balance (sitting and/or standing);Decreased knowledge of use of DME or AE;Impaired UE functional use;Obesity      OT Treatment/Interventions: Self-care/ADL training;Therapeutic exercise;Therapeutic activities;Energy conservation;Visual/perceptual remediation/compensation;DME and/or AE instruction;Balance training;Patient/family education;Manual therapy;Modalities;Cognitive remediation/compensation;Neuromuscular education    OT Goals(Current goals can be found in the care plan section) Acute Rehab OT Goals Patient Stated Goal: get better OT Goal Formulation: With patient/family Time For Goal Achievement: 04/04/21 Potential to Achieve Goals: Fair ADL Goals Pt Will Perform Grooming: with min assist;with mod assist;sitting (with MOD support for sitting balance)  OT Frequency: Min 3X/week   Barriers to D/C: Inaccessible home environment;Decreased caregiver support          Co-evaluation              AM-PAC OT "6 Clicks" Daily Activity     Outcome Measure Help from another person eating meals?: A Lot Help from another person taking care of personal grooming?: A Little Help from another person toileting, which includes using toliet, bedpan, or urinal?: Total Help from another person bathing (including washing, rinsing, drying)?: A Lot Help from another person to put on and taking off regular upper body clothing?: A Lot Help from another person to put on and taking off regular lower body  clothing?: Total 6 Click Score: 11   End of Session Equipment Utilized During Treatment: Oxygen (trach collar) Nurse Communication: Mobility status  Activity Tolerance: Patient limited by fatigue Patient left: in bed;with call bell/phone within  reach;with bed alarm set  OT Visit Diagnosis: Other abnormalities of gait and mobility (R26.89);Muscle weakness (generalized) (M62.81);Other symptoms and signs involving cognitive function                Time: 3790-2409 OT Time Calculation (min): 29 min Charges:  OT General Charges $OT Visit: 1 Visit OT Evaluation $OT Re-eval: 1 Re-eval OT Treatments $Therapeutic Activity: 8-22 mins  Gerrianne Scale, MS, OTR/L ascom [phone] 03/21/21, 4:54 PM

## 2021-03-21 NOTE — Progress Notes (Signed)
PHARMACY CONSULT NOTE  Pharmacy Consult for Electrolyte Monitoring and Replacement   Recent Labs: Potassium (mmol/L)  Date Value  03/21/2021 3.1 (L)  05/04/2014 4.3   Magnesium (mg/dL)  Date Value  03/21/2021 1.8   Calcium (mg/dL)  Date Value  03/21/2021 7.9 (L)   Calcium, Total (mg/dL)  Date Value  05/04/2014 9.1   Albumin (g/dL)  Date Value  03/17/2021 3.3 (L)   Phosphorus (mg/dL)  Date Value  03/21/2021 2.3 (L)   Sodium (mmol/L)  Date Value  03/21/2021 138  05/04/2014 140   Corrected Ca: 9.3 mg/dL  Assessment: 60 y/o male with a h/o HTN, HLD, OSA, CHF, MDD, GERD, cerebral aneurysm and pulmonary HTN who is admitted with CAP, COPD exacerbation and encephalopathy, s/p tracheostomy and NGT 6/8  Nutrition: vital at 65 mL/hr, PROSource 90 mL QID>TID  Na: 154>138 (~146 corrected for gluc 449) (on free water 300>336mL q2h) K: 3.7>3.1  (on lasix 20mg  PO) Phos: 2.3 Mg: 2.5>1.8  Goal of Therapy:  Electrolytes WNL  Plan:  Hypernatremia: free water decreased to 300 mL per tube q2h ~49mmol/L in last 24hrs (corrected Na 146 for gluc 449) Hypokalemia: 40 meq PO (packets) Per previous response ~0.76mmol/L per 43meq PO Will receive ~50meq from Kphos IV replacement today. Hypophos: IV Kphos 52mmol  Other lytes WNL; no further replacement today. Recheck electrolytes in AM   Lorna Dibble ,PharmD Clinical Pharmacist 03/21/2021 7:30 AM

## 2021-03-21 NOTE — Progress Notes (Signed)
Physical Therapy Treatment Patient Details Name: Eddie Chapman MRN: [id number] DOB: 1961-09-29 Today's Date: 03/21/2021    History of Present Illness Eddie Chapman is a 60 y.o. with a complex medical history including systolic CHF chronically, pulmonary hypertension, obstructive sleep apnea, chronic hypoxemia with respiratory failure, obesity hypoventilation syndrome with thoracic restriction, hormonal dysfunction with hypotestosteronism, dyslipidemia, metabolic syndrome, previous subarachnoid hemorrhage, history of traumatic thoracic spinal injury, & depression. He presented to [facility] ED on 02/23/21 with c/o of confusion & SOB for at least 3 days. Pt was admitted to CCU for ARF. he failed extubation and had trach placed on 03/07/21.    PT Comments     Patient alert, expressive throughout session. Urination noted prior to starting session, bed mobility with minAx2 for complete bed change/gown change. Pt able to assist with all bed mobility. Despite encouragement and education, pt reported fatigue and closed his eyes, not agreeable to further intervention currently. The patient would benefit from further skilled PT intervention to continue to progress towards goals. Recommendation remains appropriate.      Follow Up Recommendations  CIR     Equipment Recommendations  Other (comment)    Recommendations for Other Services       Precautions / Restrictions Precautions Precautions: Fall Precaution Comments: ventilated with trach Restrictions Weight Bearing Restrictions: No    Mobility  Bed Mobility Overal bed mobility: Needs Assistance Bed Mobility: Supine to Sit;Sit to Supine Rolling: +2 for physical assistance;Min assist         General bed mobility comments: more assist to intiate movement to roll L and R, but able to assist as much as possible, reach for bed rails    Transfers                    Ambulation/Gait                 Stairs              Wheelchair Mobility    Modified Rankin (Stroke Patients Only)       Balance                                            Cognition Arousal/Alertness: Awake/alert Behavior During Therapy: WFL for tasks assessed/performed Overall Cognitive Status: Impaired/Different from baseline Area of Impairment: Safety/judgement                     Memory: Decreased short-term memory Following Commands: Follows one step commands consistently Safety/Judgement: Decreased awareness of safety;Decreased awareness of deficits Awareness: Anticipatory;Emergent          Exercises Other Exercises Other Exercises: bed mobility for pericare, linen change. minAx2    General Comments        Pertinent Vitals/Pain Pain Assessment: Faces Faces Pain Scale: Hurts a little bit Pain Location: intermittently with bed mobility Pain Descriptors / Indicators: Grimacing;Guarding Pain Intervention(s): Limited activity within patient's tolerance;Monitored during session;Repositioned    Home Living                      Prior Function            PT Goals (current goals can now be found in the care plan section) Progress towards PT goals: Progressing toward goals    Frequency    Min 2X/week      PT  Plan Current plan remains appropriate    Co-evaluation              AM-PAC PT "6 Clicks" Mobility   Outcome Measure  Help needed turning from your back to your side while in a flat bed without using bedrails?: Total Help needed moving from lying on your back to sitting on the side of a flat bed without using bedrails?: Total Help needed moving to and from a bed to a chair (including a wheelchair)?: Total Help needed standing up from a chair using your arms (e.g., wheelchair or bedside chair)?: A Lot Help needed to walk in hospital room?: A Lot Help needed climbing 3-5 steps with a railing? : A Lot 6 Click Score: 9    End of Session Equipment Utilized  During Treatment: Oxygen (trach collar) Activity Tolerance: Patient tolerated treatment well Patient left: in bed;with call bell/phone within reach;Other (comment) Nurse Communication: Mobility status PT Visit Diagnosis: Muscle weakness (generalized) (M62.81);Difficulty in walking, not elsewhere classified (R26.2);Unsteadiness on feet (R26.81)     Time: 2820-6015 PT Time Calculation (min) (ACUTE ONLY): 24 min  Charges:  $Therapeutic Activity: 23-37 mins                     Lieutenant Diego PT, DPT 12:28 PM,03/21/21

## 2021-03-22 ENCOUNTER — Inpatient Hospital Stay: Payer: PPO

## 2021-03-22 LAB — CBC WITH DIFFERENTIAL/PLATELET
Abs Immature Granulocytes: 0.13 10*3/uL — ABNORMAL HIGH (ref 0.00–0.07)
Basophils Absolute: 0 10*3/uL (ref 0.0–0.1)
Basophils Relative: 0 %
Eosinophils Absolute: 0 10*3/uL (ref 0.0–0.5)
Eosinophils Relative: 0 %
HCT: 40.8 % (ref 39.0–52.0)
Hemoglobin: 13.6 g/dL (ref 13.0–17.0)
Immature Granulocytes: 1 %
Lymphocytes Relative: 6 %
Lymphs Abs: 0.9 10*3/uL (ref 0.7–4.0)
MCH: 32.8 pg (ref 26.0–34.0)
MCHC: 33.3 g/dL (ref 30.0–36.0)
MCV: 98.3 fL (ref 80.0–100.0)
Monocytes Absolute: 1.3 10*3/uL — ABNORMAL HIGH (ref 0.1–1.0)
Monocytes Relative: 8 %
Neutro Abs: 13 10*3/uL — ABNORMAL HIGH (ref 1.7–7.7)
Neutrophils Relative %: 85 %
Platelets: 207 10*3/uL (ref 150–400)
RBC: 4.15 MIL/uL — ABNORMAL LOW (ref 4.22–5.81)
RDW: 12.6 % (ref 11.5–15.5)
WBC: 15.5 10*3/uL — ABNORMAL HIGH (ref 4.0–10.5)
nRBC: 0 % (ref 0.0–0.2)

## 2021-03-22 LAB — BASIC METABOLIC PANEL
Anion gap: 11 (ref 5–15)
BUN: 19 mg/dL (ref 8–23)
CO2: 34 mmol/L — ABNORMAL HIGH (ref 22–32)
Calcium: 9.3 mg/dL (ref 8.9–10.3)
Chloride: 99 mmol/L (ref 98–111)
Creatinine, Ser: 0.8 mg/dL (ref 0.61–1.24)
GFR, Estimated: >60 mL/min (ref >60)
Glucose, Bld: 167 mg/dL — ABNORMAL HIGH (ref 70–99)
Potassium: 3.4 mmol/L — ABNORMAL LOW (ref 3.5–5.1)
Sodium: 144 mmol/L (ref 135–145)

## 2021-03-22 LAB — GLUCOSE, CAPILLARY
Glucose-Capillary: 166 mg/dL — ABNORMAL HIGH (ref 70–99)
Glucose-Capillary: 158 mg/dL — ABNORMAL HIGH (ref 70–99)
Glucose-Capillary: 159 mg/dL — ABNORMAL HIGH (ref 70–99)
Glucose-Capillary: 177 mg/dL — ABNORMAL HIGH (ref 70–99)
Glucose-Capillary: 144 mg/dL — ABNORMAL HIGH (ref 70–99)
Glucose-Capillary: 132 mg/dL — ABNORMAL HIGH (ref 70–99)

## 2021-03-22 LAB — MAGNESIUM: Magnesium: 1.9 mg/dL (ref 1.7–2.4)

## 2021-03-22 LAB — PHOSPHORUS: Phosphorus: 2.3 mg/dL — ABNORMAL LOW (ref 2.5–4.6)

## 2021-03-22 MED ORDER — POTASSIUM PHOSPHATES 15 MMOLE/5ML IV SOLN
20.0000 mmol | Freq: Once | INTRAVENOUS | Status: AC
  Administered 2021-03-22: 20 mmol via INTRAVENOUS
  Filled 2021-03-22: qty 6.67

## 2021-03-22 MED ORDER — FUROSEMIDE 10 MG/ML IJ SOLN
40.0000 mg | Freq: Two times a day (BID) | INTRAMUSCULAR | Status: DC
  Administered 2021-03-22 – 2021-03-23 (×3): 40 mg via INTRAVENOUS
  Filled 2021-03-22 (×3): qty 4

## 2021-03-22 MED ORDER — FREE WATER
300.0000 mL | Status: DC
  Administered 2021-03-22 – 2021-03-26 (×24): 300 mL

## 2021-03-22 MED ORDER — ACETAMINOPHEN 325 MG PO TABS
650.0000 mg | ORAL_TABLET | Freq: Four times a day (QID) | ORAL | Status: DC | PRN
  Administered 2021-03-22 – 2021-03-23 (×2): 650 mg via ORAL
  Filled 2021-03-22 (×3): qty 2

## 2021-03-22 MED ORDER — POTASSIUM CHLORIDE 20 MEQ PO PACK
20.0000 meq | PACK | ORAL | Status: AC
Start: 2021-03-22 — End: 2021-03-22
  Administered 2021-03-22 (×2): 20 meq
  Filled 2021-03-22 (×2): qty 1

## 2021-03-22 NOTE — Progress Notes (Signed)
SLP Cancellation Note  Patient Details Name: Eddie Chapman MRN: [id number] DOB: 09-18-1961   Cancelled treatment:       Reason Eval/Treat Not Completed: Medical issues which prohibited therapy;Patient not medically ready (chart reviewed; visited room and spoke w/ RT/NSG/GF) Attempted PMV and po trials assessments today, however, pt was unable to participate in tx session d/t requiring Vent support d/t increasing confusion and elevated HR(120s-130s). Not appropriate for ST assessments at this time. Discussed w/ NSG and GF, RT.  ST services will f/u tomorrow for appropriateness for tx sessions.      Orinda Kenner, MS, CCC-SLP Speech Language Pathologist Rehab Services [phone] [facility] 03/22/2021, 5:39 PM

## 2021-03-22 NOTE — Plan of Care (Signed)
Pt soaking bed this morning, two bed changes by 1100, pt protecting scrotum at this point, scrotum noted with redness. TF restarted w/ 300 cc free water Q4H, plus 40 mg IV lasix BID, primofit placed again to try and catch at least some urine but pt's [enis is completely prolapsed into his scrotum and after a bit primofit overwhelmed, condom cath will not stay on patient.  Dr Vaughan Browner agreed to insertion of a foley at this time to minimize further damage to patient's skin.  Foley inserted with immediate drainage 1600 cc clear yellow urine.   Pt is less appropriate today, has to be redirected from trying to get OOB to "go out to the parking lot, my ride is here."  Pt on trach collar at 5 L since early morning, but is now tachypneic and tachycardic, have tried clonazepam and versed, but these have had little effect, RT will place him back on vert support at this time

## 2021-03-22 NOTE — Progress Notes (Signed)
Patient is alert and verbal. Difficult to understand due to trach. Copious amounts of urine noted. Bed was changed 3 times overnight. He complained. TF held and he states that he feels better but it still hurts. Patient noted trying to get out of bed. Feet were hanging over the bed and he was holding on to the IV pole. Stated that he wanted to get up. It was explained to him that PT would have to initiate that process. Will continue to monitor.

## 2021-03-22 NOTE — Progress Notes (Signed)
Nutrition Follow-up  DOCUMENTATION CODES:   Morbid obesity  INTERVENTION:   Resume Vital 1.5 '@65ml' /hr + ProSource TF 53m TID  Free water flushes 3021mq4 hours   Regimen provides 2580kcal/day, 171g/day protein and 299266may free water   Discontinue Nutrisource Fiber   NUTRITION DIAGNOSIS:   Increased nutrient needs related to acute illness as evidenced by estimated needs.  GOAL:   Provide needs based on ASPEN/SCCM guidelines -met with tube feeds   MONITOR:   Vent status, Labs, Weight trends, TF tolerance, Skin, I & O's  ASSESSMENT:   [age]o male with a h/o HTN, HLD, OSA, CHF, MDD, GERD, cerebral aneurysm and pulmonary HTN who is admitted with CAP, COPD exacerbation and encephalopathy.   Pt on trach collar. NGT in place. Tube feeds held as pt complaining of abdominal discomfort. KUB from 6/21 with no significant findings. RD suspects satiety from high volume free water flushes. Pt also started on soluble fiber; RD will discontinue. Hypernatremia resolved; will adjust free water. IVF discontinued today. Pt receiving lasix. UOP decreased. Per chart, pt is up ~13lbs from his UBW. Pt +21.1L on his I & O's. Pt with frequent urination reported by nurse. Will adjust free water again tomorrow if sodium remains normal. Pt seen by SLP 6/22 and has started using PMSV. Plan is for possible inpatient rehab as discharge.   Medications reviewed and include: lovenox, lasix, insulin, protonix, miralax, KCL, senokot, 5% dextrose '@75ml' /hr, Kphos   Labs reviewed: Na 144 wnl, K 3.4(L), P 2.3(L), Mg 1.9wnl Wbc- 15.5(H) Cbgs- 166, 158 x 24 hrs  MAP- >77m47m UOP- 775ml53met Order:   Diet Order             Diet NPO time specified  Diet effective now                  EDUCATION NEEDS:   No education needs have been identified at this time  Skin:  Skin Assessment: Reviewed RN Assessment  Last BM:  6/22- type 6  Height:   Ht Readings from Last 1 Encounters:  03/21/21 '5\' 10"'   (1.778 m)    Weight:   Wt Readings from Last 1 Encounters:  03/22/21 135.1 kg    Ideal Body Weight:  75.5 kg  BMI:  Body mass index is 42.74 kg/m.  Estimated Nutritional Needs:   Kcal:  2548kcal/day  Protein:  >150g/day-189g/day  Fluid:  2.3-2.6L/day  CaseyKoleen DistanceRD, LDN Please refer to [facility] CenterRD and/or RD on-call/weekend/after hours pager

## 2021-03-22 NOTE — Progress Notes (Signed)
Pt placed on trach collar, tolerating well at this time. Will continue to monitor.

## 2021-03-22 NOTE — Progress Notes (Signed)
Pt placed on doc vent settings to rest overnight. No sig issues to note. Secretions moderate. Plan to resume trach trials this am. Will follow.

## 2021-03-22 NOTE — Progress Notes (Signed)
PHARMACY CONSULT NOTE  Pharmacy Consult for Electrolyte Monitoring and Replacement   Recent Labs: Potassium (mmol/L)  Date Value  03/22/2021 3.4 (L)  05/04/2014 4.3   Magnesium (mg/dL)  Date Value  03/22/2021 1.9   Calcium (mg/dL)  Date Value  03/22/2021 9.3   Calcium, Total (mg/dL)  Date Value  05/04/2014 9.1   Albumin (g/dL)  Date Value  03/17/2021 3.3 (L)   Phosphorus (mg/dL)  Date Value  03/22/2021 2.3 (L)   Sodium (mmol/L)  Date Value  03/22/2021 144  05/04/2014 140   Corrected Ca: 9.3 mg/dL  Assessment: 60 y/o male with a h/o HTN, HLD, OSA, CHF, MDD, GERD, cerebral aneurysm and pulmonary HTN who is admitted with CAP, COPD exacerbation and encephalopathy, s/p tracheostomy and NGT 6/8  Nutrition: vital at 65 mL/hr, PROSource 90 mL QID>TID  Na: 154>146>145>144 (on free water 300 q2h>q4h) K: 3.5>3.4  (on lasix 20mg  PO > 40mg  IV BID) Phos: 2.3>2.3 Mg: 1.8>1.9  Goal of Therapy:  Electrolytes WNL  Plan:  Hypernatremia (resolved): free water decreased to 300 mL per tube q4h and D5W @75ml /hr stopped. Hypokalemia: 40 meq PO (packets) Per previous response ~0.2mmol/L per 47meq PO Will receive ~52meq from Kphos IV replacement today. Hypophos: IV Kphos 74mmol Other lytes WNL; no further replacement today. Recheck electrolytes in AM   Lorna Dibble ,PharmD Clinical Pharmacist 03/22/2021 11:36 AM

## 2021-03-22 NOTE — Plan of Care (Signed)
  Problem: Health Behavior/Discharge Planning: Goal: Ability to manage health-related needs will improve Outcome: Progressing   Problem: Clinical Measurements: Goal: Ability to maintain clinical measurements within normal limits will improve Outcome: Progressing Goal: Will remain free from infection Outcome: Progressing Goal: Diagnostic test results will improve Outcome: Progressing Goal: Respiratory complications will improve Outcome: Progressing Goal: Cardiovascular complication will be avoided Outcome: Progressing   Problem: Activity: Goal: Risk for activity intolerance will decrease Outcome: Progressing   Problem: Coping: Goal: Level of anxiety will decrease Outcome: Progressing   Problem: Pain Managment: Goal: General experience of comfort will improve Outcome: Progressing

## 2021-03-22 NOTE — Progress Notes (Signed)
PT Cancellation Note  Patient Details Name: Eddie Chapman MRN: [id number] DOB: 1960/11/04   Cancelled Treatment:    Reason Eval/Treat Not Completed: Medical issues which prohibited therapy Intention for PT to see today and attempt to get to recliner.  However, on arrival early this afternoon he had apparently been off vent and on trach since ~7:30 this AM.  Apparently he had increasing confusion along with increasing HR and RR, ultimately needed to be placed back on vent.  HR sustaining in the 120-130s t/o discussion with nursing.  Not appropriate for PT at this time.    Kreg Shropshire, DPT 03/22/2021, 2:41 PM

## 2021-03-22 NOTE — Progress Notes (Signed)
NAME:  Eddie Chapman, MRN:  [id number], DOB:  1961-04-03, LOS: 37 ADMISSION DATE:  02/23/2021  Brief Pt Description / Synopsis:  60 yo morbidly obese male with acute on chronic hypoxic and hypercapnic respiratory failure due to [place] and AECOPD, along with severe toxic metabolic encephalopathy.  Failure to wean from vent requiring Tracheostomy on 03/07/2021.  History of Present Illness:   Patient has a complex comorbid history including systolic CHF chronically, pulmonary hypertension, obstructive sleep apnea, chronic hypoxemia with respiratory failure, obesity hypoventilation syndrome with thoracic restriction, hormonal dysfunction with hypotestosteronism, dyslipidemia, metabolic syndrome, previous subarachnoid hemorrhage, history of traumatic thoracic spinal injury, depression.  Patient was admitted in January to the intensive care unit with altered mental status acute hypoxemic and hypercapnic respiratory failure with metabolic encephalopathy and left lower lobe severe pneumonia.  Throughout hospitalization he was noted to have lethargy with hypercapnic encephalopathy and became unresponsive a few times even postextubation while on medical floor.  Patient has never smoked in the past.   On this admission he is poorly responsive with hypercapnic hypoxemic respiratory failure          Events:      02/24/21- Patient responded well to mechanical ventilation with improvement in hypercapnia on ABG this am.  Seems that he had THC induced apnea/hypopnea based on laboratory testing.  There is bibasilar atelectasis induced severe hypoxemia, recruitment Metaneb therapy has been ordered. Plan to repeat CXR this afternoon with SBT today.  Reviewed plan with RN and RT today.     02/25/21- patient was unable to pass SBT today. He was able to come off propofol with RASS-0 and is weaned down on levophed.     02/26/21- patient continues to require levophed, he has been difficult  to wean. I have asked ID for consultation due to concern for sepsis possibly due to CAP. Unable to perform SBT today. Family at bedside.   02/27/21- patient on PRVC with heavy secretions, difficulty weaning with high narcotic tolerance. Patient had suicide attempt with requirement for psych.   6/1 failing to wean from vent, DNR status 6/2 failed to wean from vent, severe hypoxia 6/3 severe hypoxia, failure to wean from vent, needs [facility], ENT consulted 6/4 failure to wean from  6/6 failure to wean from vent, plan for [facility] 6/7 failure to wean from vent 6/8 plan for [facility] TODAY 6/9 left foot cyanosis, vasc surgery consulted 6/10: Agitation overnight, added oxycodone, valium, seroquel.  SBT in progress.  Working with PT 6/11: Less agitated, however, did not tolerate SBT today 6/12: Dilaudid being weaned off, less agitated.  Can actually speak with trach.  Tolerating SBT 03/12/21- patient febrile, Trache aspirate sent for viral workup. Patient with anasarca on examination. Adequate UOP.  On PRVC 35%, GCS3T.  03/13/21- TCC having difficulty finding CIR placement.  There is RLE swelling and hypothermia and DVT study was ordered, he is with severe anxiety during SBT today and we reviewed MAR with addition of Abilify due to no QTC effect.   03/14/21- patient is awake, he is attempting again for trache collar trial today.  His secretions are improved, he is vocalizing and trying to write for communication.  03/15/21- patient is alert he communicates via gesturing and attempts to vocalize.  Still in withdrawal and on precedex but calm and appropriate.  Plan is to wean precedex and get to PT/OT.  03/16/21- patient is improved mentation, plan for PMV and SLP with ongoing PNMR transfer for recovery.  He will need  psych eval at some point due to suicide attempt 03/17/21- patient is speaking via gesturing and writing.  Trach changed to XLT 6.  Ongoing plan for CIR.   03/18/21- patient continue to improve, he will be  able to join PT/OT with plan to move to [facility] inpatient rehab asap.  03/19/21- SBT vs Trach collar trials during day, rest on vent at night.  Lasix x1 today 03/20/21-Pts current vent settings SBT 15/5 and tolerating well will attempt to trach collar  03/21/21- Tolerating Trach collar trials, work with PT/OT 03/22/21- Continues to tolerate Trach collar trial during day. Speech consulted for PMV. New Leukocytosis (WBC 15.5) and low grade fever.  CXR without new infiltrate. No foley, and denies dysuria.  Plan to remove PICC line.   Micro Data:  5/27: SARS-CoV-2 and influenza PCR>> negative 5/27: Blood culture x2>> no growth 5/27: Urine>> no growth 5/27: MRSA PCR>> negative 5/28: Sputum>> normal respiratory flora 5/30: Respiratory viral panel>> negative 5/31: SPUTUM CULTURE >> ACINETOBACTER CALCOACETICUS/BAUMANNII COMPLEX  6/13: Tracheal aspirate>>ACINETOBACTER CALCOACETICUS/BAUMANNII COMPLEX      Antimicrobials:  Azithromycin 5/27>>5/29 Ceftriaxone 5/27>>5/29 Vancomycin 5/29>>5/30 Zosyn 5/29>>6/1 Unasyn 6/1>>6/10  Antibiotics Given (last 72 hours)     None      Scheduled Meds:  ARIPiprazole  5 mg Per Tube Daily   chlorhexidine gluconate (MEDLINE KIT)  15 mL Mouth Rinse BID   Chlorhexidine Gluconate Cloth  6 each Topical Daily   cloNIDine  0.1 mg Transdermal Weekly   enoxaparin (LOVENOX) injection  0.5 mg/kg Subcutaneous Q24H   feeding supplement (PROSource TF)  90 mL Per Tube TID   free water  300 mL Per Tube Q4H   furosemide  40 mg Intravenous BID   insulin aspart  0-15 Units Subcutaneous Q4H   mouth rinse  15 mL Mouth Rinse 10 times per day   metoprolol tartrate  25 mg Per Tube BID   pantoprazole sodium  40 mg Per Tube Daily   polyethylene glycol  17 g Per Tube Daily   potassium chloride  20 mEq Per Tube Q4H   senna-docusate  1 tablet Per Tube BID   sodium chloride flush  10-40 mL Intracatheter Q12H   Continuous Infusions:  sodium chloride Stopped (03/11/21 1855)    feeding supplement (VITAL 1.5 CAL) 1,000 mL (03/22/21 1107)   potassium PHOSPHATE IVPB (in mmol) 85 mL/hr at 03/22/21 1200   PRN Meds:.bisacodyl, clonazepam, docusate sodium, glycopyrrolate, hydrALAZINE, labetalol, midazolam, oxyCODONE, polyethylene glycol, polyvinyl alcohol, sodium chloride flush, traZODone    Objective   Blood pressure 106/67, pulse (!) 120, temperature 99.8 F (37.7 C), temperature source Oral, resp. rate (!) 39, height '5\' 10"'  (1.778 m), weight 135.1 kg, SpO2 90 %.    Vent Mode: PRVC FiO2 (%):  [28 %-35 %] 28 % Set Rate:  [15 bmp] 15 bmp Vt Set:  [550 mL] 550 mL PEEP:  [5 cmH20] 5 cmH20   Intake/Output Summary (Last 24 hours) at 03/22/2021 1328 Last data filed at 03/22/2021 1200 Gross per 24 hour  Intake 5543.37 ml  Output 2200 ml  Net 3343.37 ml    Filed Weights   03/17/21 0407 03/21/21 0500 03/22/21 0500  Weight: (!) 141.5 kg (!) 148.5 kg 135.1 kg      REVIEW OF SYSTEMS Positives in BOLD: Gen: Denies fever, chills, weight change, fatigue, night sweats HEENT: Denies blurred vision, double vision, hearing loss, tinnitus, sinus congestion, rhinorrhea, sore throat, neck stiffness, dysphagia PULM: Denies shortness of breath, cough, sputum production, hemoptysis, wheezing CV: Denies chest  pain, edema, orthopnea, paroxysmal nocturnal dyspnea, palpitations GI: Denies abdominal pain, nausea, vomiting, diarrhea, hematochezia, melena, constipation, change in bowel habits GU: Denies dysuria, hematuria, polyuria, oliguria, urethral discharge Endocrine: Denies hot or cold intolerance, polyuria, polyphagia or appetite change Derm: Denies rash, dry skin, scaling or peeling skin change Heme: Denies easy bruising, bleeding, bleeding gums Neuro: Denies headache, numbness, weakness, slurred speech, loss of memory or consciousness    PHYSICAL EXAMINATION:  GENERAL: obese male sitting in bed, on trach collar trial,  NAD  NECK: Supple.  No JVD, tracheostomy  midline PULMONARY: coarse breath sounds throughout, no wheezing or rales noted, even, mild tachypnea CARDIOVASCULAR: ,Tachycardia, Regular rhythm(ST on telemetry), s1s2, no M/R/G GASTROINTESTINAL: Massively obese, soft, nontender, nondistended, no guarding or rebound tenderness, bowel sounds positive x4 MUSCULOSKELETAL: Normal bulk and tone, no deformities, 2+ edema BLE  NEUROLOGIC: Awake and alert, moves all extremities to commands,  no focal deficits, difficult to access orientation due to trach SKIN: Warm and dry.  Bilateral feet mild erythema present. No obvious rashes, lesions, ulcerations  Assessment / Plan:    Acute on Chronic Hypoxic and Hypercapnic Respiratory Failure in the setting of ACINOTOBACTER SPECIES PNEUMONIA-completed abx course and AECOPD -S/P tracheostomy on 03/07/21 -Wean FiO2 and PEEP as tolerated to maintain O2 sats 88 to 92% -Spontaneous breathing trial/trach collar trials as tolerated -Speech consulted for evaluation of PMV  -Rest on Ventilator during night -Follow intermittent CXR and ABG as needed -Bronchodilators and budesonide nebs -Continue Lasix (changed to 40 mg BID )              ACINETOBACTER CALCOACETICUS/BAUMANNII COMPLEX PNEUMONIA>>treated Leukocytosis of unknown etiology -Monitor fever curve -Trend WBCs -Follow cultures as above -s/p course of Unasyn -Repeat CXR on 03/22/21 without evidence of pneumonia -Pt has had NO foley for a couple of weeks; denies dysuria -Will have nursing obtain PIV's and remove PICC line today 03/22/21  Septic Shock>>resolved Hypertension -Continuous cardiac monitoring -Continue clonidine patch and Metoprolol  Hypernatremia>>resolved Mild Hypokalemia -Monitor I&O's / urinary output -Follow BMP -Ensure adequate renal perfusion -Avoid nephrotoxic agents as able -Replace electrolytes as indicated -Pharmacy following for assistance with electrolyte replacement -Continue free water flushes to 300 ml q4h   Acute toxic  metabolic encephalopathy>>resolved  Reported Suicide Attempt (wife reports prior to admission patient was holding gun in his hand while in backyard and stated suicidal ideation) -Avoid sedating medications as able -Continue Abilify -PRN Klonopin, Oxycodone -Provide supportive care, encourage family presence -Promote normal sleep/wake cycle, prn trazodone  -PT/OT consults   Best practice (right click and "Reselect all SmartList Selections" daily)  Diet:  Tube Feed  Pain/Anxiety/Delirium protocol (if indicated): Yes (RASS goal 0 to -1 ) VAP protocol (if indicated): Yes DVT prophylaxis: Lovenox GI prophylaxis: PPI Glucose control:  SSI Yes Central venous access:  Will plan to remove PICC line today 03/22/21 Arterial line:  N/A Foley:  N/A Mobility:  OOB as tolerated PT consulted: yes Code Status:  DNR Disposition: ICU  Updated pt's significant other at bedside 03/22/21   Labs   CBC: Recent Labs  Lab 03/18/21 0522 03/19/21 0513 03/20/21 0454 03/21/21 0444 03/22/21 0424  WBC 9.5 11.6* 9.2 9.1 15.5*  NEUTROABS 7.6 9.7* 7.2 6.9 13.0*  HGB 12.7* 12.5* 11.5* 10.9* 13.6  HCT 41.1 40.5 38.5* 33.9* 40.8  MCV 105.7* 105.5* 108.5* 104.0* 98.3  PLT 210 197 168 147* 207     Basic Metabolic Panel: Recent Labs  Lab 03/17/21 0356 03/18/21 0522 03/19/21 0513 03/20/21 0454 03/21/21 0444  03/21/21 0755 03/22/21 0424  NA 154* 157* 155* 154* 138 145 144  K 3.4* 3.1* 3.4* 3.7 3.1* 3.5 3.4*  CL 116* 118* 114* 112* 99 103 99  CO2 36* 32 33* 35* 33* 35* 34*  GLUCOSE 162* 124* 146* 145* 449* 156* 167*  BUN 46* 51* 31* 31* '21 22 19  ' CREATININE 1.37* 1.41* 1.04 0.94 0.89 0.93 0.80  CALCIUM 8.9 8.7* 8.6* 8.6* 7.9* 8.3* 9.3  MG 2.3 2.4 2.4 2.5* 1.8  --  1.9  PHOS 4.2 3.8 2.6  --  2.3*  --  2.3*    GFR: Estimated Creatinine Clearance: 134.1 mL/min (by C-G formula based on SCr of 0.8 mg/dL). Recent Labs  Lab 03/19/21 0513 03/20/21 0454 03/21/21 0444 03/22/21 0424  WBC 11.6* 9.2  9.1 15.5*     Liver Function Tests: Recent Labs  Lab 03/17/21 0356  ALBUMIN 3.3*    No results for input(s): LIPASE, AMYLASE in the last 168 hours. No results for input(s): AMMONIA in the last 168 hours.  ABG    Component Value Date/Time   PHART 7.40 03/02/2021 2000   PCO2ART 69 (HH) 03/02/2021 2000   PO2ART 137 (H) 03/02/2021 2000   HCO3 42.7 (H) 03/02/2021 2000   O2SAT 99.1 03/02/2021 2000      Coagulation Profile: No results for input(s): INR, PROTIME in the last 168 hours.  Cardiac Enzymes: No results for input(s): CKTOTAL, CKMB, CKMBINDEX, TROPONINI in the last 168 hours.  HbA1C: Hgb A1c MFr Bld  Date/Time Value Ref Range Status  03/02/2021 04:52 AM 5.9 (H) 4.8 - 5.6 % Final    Comment:    (NOTE)         Prediabetes: 5.7 - 6.4         Diabetes: >6.4         Glycemic control for adults with diabetes: <7.0     CBG: Recent Labs  Lab 03/21/21 1932 03/21/21 2337 03/22/21 0330 03/22/21 0713 03/22/21 1143  GLUCAP 124* 164* 166* 158* 159*     Allergies Allergies  Allergen Reactions   Divalproex Sodium Other (See Comments)    Elevated liver enzymes   Valproic Acid     unknown    Critical Care Time: 35 minutes   Darel Hong, AGACNP-BC [facility] Prefer epic messenger for cross cover needs If after hours, please call E-link

## 2021-03-23 DIAGNOSIS — D72829 Elevated white blood cell count, unspecified: Secondary | ICD-10-CM

## 2021-03-23 DIAGNOSIS — R5081 Fever presenting with conditions classified elsewhere: Secondary | ICD-10-CM

## 2021-03-23 LAB — CBC WITH DIFFERENTIAL/PLATELET
Abs Immature Granulocytes: 0.17 10*3/uL — ABNORMAL HIGH (ref 0.00–0.07)
Basophils Absolute: 0 10*3/uL (ref 0.0–0.1)
Basophils Relative: 0 %
Eosinophils Absolute: 0.1 10*3/uL (ref 0.0–0.5)
Eosinophils Relative: 1 %
HCT: 38.6 % — ABNORMAL LOW (ref 39.0–52.0)
Hemoglobin: 12.8 g/dL — ABNORMAL LOW (ref 13.0–17.0)
Immature Granulocytes: 1 %
Lymphocytes Relative: 8 %
Lymphs Abs: 1.2 10*3/uL (ref 0.7–4.0)
MCH: 32.5 pg (ref 26.0–34.0)
MCHC: 33.2 g/dL (ref 30.0–36.0)
MCV: 98 fL (ref 80.0–100.0)
Monocytes Absolute: 2.1 10*3/uL — ABNORMAL HIGH (ref 0.1–1.0)
Monocytes Relative: 14 %
Neutro Abs: 11 10*3/uL — ABNORMAL HIGH (ref 1.7–7.7)
Neutrophils Relative %: 76 %
Platelets: 174 10*3/uL (ref 150–400)
RBC: 3.94 MIL/uL — ABNORMAL LOW (ref 4.22–5.81)
RDW: 13.3 % (ref 11.5–15.5)
WBC: 14.5 10*3/uL — ABNORMAL HIGH (ref 4.0–10.5)
nRBC: 0 % (ref 0.0–0.2)

## 2021-03-23 LAB — BASIC METABOLIC PANEL
Anion gap: 11 (ref 5–15)
BUN: 36 mg/dL — ABNORMAL HIGH (ref 8–23)
CO2: 34 mmol/L — ABNORMAL HIGH (ref 22–32)
Calcium: 8.6 mg/dL — ABNORMAL LOW (ref 8.9–10.3)
Chloride: 95 mmol/L — ABNORMAL LOW (ref 98–111)
Creatinine, Ser: 1.58 mg/dL — ABNORMAL HIGH (ref 0.61–1.24)
GFR, Estimated: 49 mL/min — ABNORMAL LOW (ref >60)
Glucose, Bld: 145 mg/dL — ABNORMAL HIGH (ref 70–99)
Potassium: 3.7 mmol/L (ref 3.5–5.1)
Sodium: 140 mmol/L (ref 135–145)

## 2021-03-23 LAB — GLUCOSE, CAPILLARY
Glucose-Capillary: 150 mg/dL — ABNORMAL HIGH (ref 70–99)
Glucose-Capillary: 132 mg/dL — ABNORMAL HIGH (ref 70–99)
Glucose-Capillary: 134 mg/dL — ABNORMAL HIGH (ref 70–99)
Glucose-Capillary: 152 mg/dL — ABNORMAL HIGH (ref 70–99)
Glucose-Capillary: 136 mg/dL — ABNORMAL HIGH (ref 70–99)
Glucose-Capillary: 165 mg/dL — ABNORMAL HIGH (ref 70–99)

## 2021-03-23 LAB — PHOSPHORUS: Phosphorus: 4 mg/dL (ref 2.5–4.6)

## 2021-03-23 LAB — PROCALCITONIN: Procalcitonin: 0.48 ng/mL

## 2021-03-23 LAB — MAGNESIUM: Magnesium: 1.9 mg/dL (ref 1.7–2.4)

## 2021-03-23 MED ORDER — VANCOMYCIN HCL IN DEXTROSE 1-5 GM/200ML-% IV SOLN
1000.0000 mg | Freq: Once | INTRAVENOUS | Status: AC
  Administered 2021-03-23: 1000 mg via INTRAVENOUS
  Filled 2021-03-23: qty 200

## 2021-03-23 MED ORDER — VANCOMYCIN HCL 1500 MG/300ML IV SOLN
1500.0000 mg | Freq: Once | INTRAVENOUS | Status: AC
  Administered 2021-03-23: 1500 mg via INTRAVENOUS
  Filled 2021-03-23: qty 300

## 2021-03-23 MED ORDER — SODIUM CHLORIDE 0.9 % IV SOLN
2.0000 g | Freq: Three times a day (TID) | INTRAVENOUS | Status: AC
  Administered 2021-03-23 – 2021-03-29 (×20): 2 g via INTRAVENOUS
  Filled 2021-03-23 (×20): qty 2

## 2021-03-23 NOTE — Progress Notes (Signed)
PHARMACY CONSULT NOTE  Pharmacy Consult for Electrolyte Monitoring and Replacement   Recent Labs: Potassium (mmol/L)  Date Value  03/23/2021 3.7  05/04/2014 4.3   Magnesium (mg/dL)  Date Value  03/23/2021 1.9   Calcium (mg/dL)  Date Value  03/23/2021 8.6 (L)   Calcium, Total (mg/dL)  Date Value  05/04/2014 9.1   Albumin (g/dL)  Date Value  03/17/2021 3.3 (L)   Phosphorus (mg/dL)  Date Value  03/23/2021 4.0   Sodium (mmol/L)  Date Value  03/23/2021 140  05/04/2014 140   Corrected Ca: 9.3 mg/dL  Assessment: 60 y/o male with a h/o HTN, HLD, OSA, CHF, MDD, GERD, cerebral aneurysm and pulmonary HTN who is admitted with CAP, COPD exacerbation and encephalopathy, s/p tracheostomy and NGT 6/8  Nutrition: vital at 65 mL/hr, PROSource 90 mL QID>TID  Na: 154>146>145>144>140 (on free water 343mL q4h) K: 3.4>3.7  (lasix discontinued 6/24 AM) Phos: 2.3>4 Mg: 1.9>1.9  Goal of Therapy:  Electrolytes WNL  Plan:  Hypernatremia (resolved): dietary planning to further decrease free water today; currently 300 mL per tube q4h, remains OFF MIVF and lasix stopped. K+,  Phos, Mg returned WNL, Other lytes WNL; no further replacement today. Recheck electrolytes in AM   Lorna Dibble ,PharmD Clinical Pharmacist 03/23/2021 11:33 AM

## 2021-03-23 NOTE — Progress Notes (Signed)
Pt placed on trach collar at 40%, tolerating well at this time. Cuff deflated

## 2021-03-23 NOTE — Evaluation (Signed)
Clinical/Bedside Swallow Evaluation Patient Details  Name: Eddie Chapman MRN: [id number] Date of Birth: 01-Apr-1961  Today's Date: 03/23/2021 Time: SLP Start Time (ACUTE ONLY): 0930 SLP Stop Time (ACUTE ONLY): 3810 SLP Time Calculation (min) (ACUTE ONLY): 45 min  Past Medical History:  Past Medical History:  Diagnosis Date   Adenomatous colon polyp    Depression    Ear drum perforation    left x2    GERD (gastroesophageal reflux disease)    Hyperlipidemia    Hypertension    T8 vertebral fracture [facility])    Past Surgical History:  Past Surgical History:  Procedure Laterality Date   CARPAL TUNNEL RELEASE     left hand   COLONOSCOPY  09/18/2009, 09/15/2014   COLONOSCOPY WITH PROPOFOL N/A 12/29/2017   Procedure: COLONOSCOPY WITH PROPOFOL;  Surgeon: Manya Silvas, MD;  Location: [facility] ENDOSCOPY;  Service: Endoscopy;  Laterality: N/A;   EYE SURGERY     FRACTURE SURGERY     HERNIA REPAIR     LIPOMA EXCISION     SPINE SURGERY     TRACHEOSTOMY TUBE PLACEMENT N/A 03/07/2021   Procedure: TRACHEOSTOMY;  Surgeon: Margaretha Sheffield, MD;  Location: [facility] ORS;  Service: ENT;  Laterality: N/A;   HPI:  Pt is a 60 yo M with a complex medical history including hx of cerebral aneurysm, PVD, HFrEF, pulmonary htn, morbid Obesity, OSA, GERD, hx of TBI, w/ recent admit in 09/2020 when he was found Hypoxic and unresponsive. Pt has slower mentation and decision making -- this is Baseline s/p accident when he fell off a roof in 2016 per GF and Father.  Pt was admitted to [facility] ED on 02/23/21 with c/o of confusion & SOB for at least 3 days. Pt was admitted to CCU for ARF. He was orally intubated on 02/28/2021; tracheostomy on 03/07/21 w/ downsize to a #6 shiley XLT on 03/17/2021; trach collar wean initiated/tolerated on 03/20/2021. Current NGT in place for enteral feedings.   Assessment / Plan / Recommendation Clinical Impression  Pt seen for initial assessment of toleration for wear/use of Passy-muir valve(PMV) today. Pt's  trach was recently downsized to a Shiley #6, XLT, Cuffed on 03/20/2021. He has tolerated trach collar weans x2 days w/ O2 support at 28% FiO2 per NSG/RT. Pt is on nocturnal vent support thus the need for a Cuffed trach. Tracheal secretions minimal w/ PRN suctioning. Pt resting in bed. Explained the plan for ice chips trials w/ PMV placed while respiratory status comfortable and wfl for PMV wear/po trials. At Baseline: RR 19-41, O2 sats 98-99%, HR 84-88.    Pt appears to present w/ grossly adequate oropharyngeal phase swallow function w/ No oropharyngeal phase dysphagia noted, no neuromuscular deficits noted w/ the ice chips trials assessed. No other trial consistencies assessed d/t Pulmonary presentation today. Pt consumed the ice chip trials w/ No overt, clinical s/s of aspiration during po trials. Pt appears at reduced risk for aspiration following general aspiration precautions. Pt consumed the trials w/ no overt coughing, decline in vocal quality, or change in respiratory presentation during/post trials from his Baseline. Oral phase appeared [facility] w/ timely bolus management, mastication of chips, and control of bolus propulsion for A-P transfer for swallowing. Oral clearing achieved. OM Exam appeared [place]os w/ no unilateral weakness noted. Speech Clear though w/ min decreased breath support during trials. Pt was fed for support.   Recommend continue assessment of toleration of po trials/consistencies when pt is able to wear the PMV w/ stable ANS presentation; that  was not his presentation today(HR/RR elevation; decreased O2 sats - PMV was removed after ~20 mins.). Recommend continued ST f/u for ongoing assessment for toleration of safe, oral intake but otherwise NPO status currently. Recommend frequent oral care via swabs for hygiene and stimulation of swallowing. NSG and MD updated on POC. ST services will f/u tomorrow. SLP Visit Diagnosis: Dysphagia, unspecified (R13.10) (tracheostomy)    Aspiration Risk   Moderate aspiration risk (NGT in place)    Diet Recommendation  NPO  Medication Administration: Via alternative means    Other  Recommendations Recommended Consults:  (Dietician following) Oral Care Recommendations: Oral care QID;Staff/trained caregiver to provide oral care Other Recommendations:  (TBD)   Follow up Recommendations Inpatient Rehab (TBD)      Frequency and Duration min 3x week  2 weeks       Prognosis Prognosis for Safe Diet Advancement: Guarded (-Fair) Barriers to Reach Goals: Cognitive deficits (Pulmonary status/decline)      Swallow Study   General Date of Onset: 02/23/21 HPI: Pt is a 60 yo M with a complex medical history including hx of cerebral aneurysm, PVD, HFrEF, pulmonary htn, morbid Obesity, OSA, GERD, hx of TBI, w/ recent admit in 09/2020 when he was found Hypoxic and unresponsive. Pt has slower mentation and decision making -- this is Baseline s/p accident when he fell off a roof in 2016 per GF and Father.  Pt was admitted to [facility] ED on 02/23/21 with c/o of confusion & SOB for at least 3 days. Pt was admitted to CCU for ARF. He was orally intubated on 02/28/2021; tracheostomy on 03/07/21 w/ downsize to a #6 shiley XLT on 03/17/2021; trach collar wean initiated/tolerated on 03/20/2021. Current NGT in place for enteral feedings. Type of Study: Bedside Swallow Evaluation Previous Swallow Assessment: none Diet Prior to this Study: NPO;NG Tube Temperature Spikes Noted: Yes (wbc 14.5) Respiratory Status: Trach Collar (10L - wean per RT) Trach Size and Type: Cuff;#6 History of Recent Intubation: Yes Length of Intubations (days): 8 days Date extubated: 03/07/21 (tracheostomy) Behavior/Cognition: Alert;Cooperative;Pleasant mood;Distractible;Requires cueing (baseline TBI cognitive decline) Oral Cavity Assessment: Dry (min) Oral Care Completed by SLP: Yes Oral Cavity - Dentition: Adequate natural dentition Vision: Functional for self-feeding Self-Feeding Abilities:  Able to feed self;Needs assist;Needs set up (monitoring for impulsivity) Patient Positioning: Upright in bed (needed positioning) Baseline Vocal Quality: Normal (min reduced breath support for speech) Volitional Cough: Strong Volitional Swallow: Able to elicit    Oral/Motor/Sensory Function Overall Oral Motor/Sensory Function: Within functional limits   Ice Chips Ice chips: Within functional limits Presentation: Spoon (fed; 15+ single ice chips)   Thin Liquid Thin Liquid: Not tested    Nectar Thick Nectar Thick Liquid: Not tested   Honey Thick Honey Thick Liquid: Not tested   Puree Puree: Not tested   Solid     Solid: Not tested        Orinda Kenner, MS, CCC-SLP Speech Language Pathologist Rehab Services [phone] [facility] 03/23/2021,2:24 PM

## 2021-03-23 NOTE — Progress Notes (Signed)
OT Cancellation Note  Patient Details Name: SOMA LIZAK MRN: [id number] DOB: [birth year]   Cancelled Treatment:    Reason Eval/Treat Not Completed: Other (comment). Chart reviewed, upon arrival PT entering room with +2 assist. Will re-attempt at later date/time as able.  Dessie Coma, M.S. OTR/L  03/23/21, 3:36 PM  ascom [phone]

## 2021-03-23 NOTE — Consult Note (Signed)
Pharmacy Antibiotic Note  Eddie Chapman is a 60 y.o. male admitted on 02/23/2021 with pneumonia. His 5/31 sputum culture (tracheal aspirate) found acinetobacter calcoaceticus/baumannii complex. He was treated with 10 days (6/1-6/10) of Unasyn. On 6/13, repeat sputum cultures were sent and found acinetobacter calcoaceticus/baumannii complex. He became febrile and had elevated WBC on 6/23. Repeat sputum was sent again on 6/24.  Pharmacy has been consulted for vancomycin and cefepime dosing.  Plan: Cefepime 2 g q8h Continue to monitor CrCl ISO new AKI  Vancomycin 2500 mg loading dose Current half-life expect dosing interval of >24hrs (d/t new AKI); Possible vanc infusion dose: 1750 mg q24h may be reasonable, but will determine maintenance regimen with next SCr and order AM labs  Height: 5\' 10"  (177.8 cm) Weight: (!) 136.1 kg (300 lb 0.7 oz) IBW/kg (Calculated) : 73  Temp (24hrs), Avg:101.1 F (38.4 C), Min:100.4 F (38 C), Max:101.8 F (38.8 C)  Recent Labs  Lab 03/19/21 0513 03/20/21 0454 03/21/21 0444 03/21/21 0755 03/22/21 0424 03/23/21 0613  WBC 11.6* 9.2 9.1  --  15.5* 14.5*  CREATININE 1.04 0.94 0.89 0.93 0.80 1.58*    Estimated Creatinine Clearance: 68.2 mL/min (A) (by C-G formula based on SCr of 1.58 mg/dL (H)).    Allergies  Allergen Reactions   Divalproex Sodium Other (See Comments)    Elevated liver enzymes   Valproic Acid     unknown    Antimicrobials this admission: Cefepime 6/24 >> Vancomycin 6/24 >> ------ Amp/sulbactam 6/1 >> 6/10 Zosyn 5/29 >> 5/31 Vancomycin 5/29 >> 5/30 Azithromycin 5/27 >> 5/29 Ceftriaxone 5/27 >> 5/28  Dose adjustments this admission: CTM SCr 0.80 > 1.58  Microbiology results: Recent cultures:    6/24 Repeat Sputum (tracheal aspirate): Sent/pending    6/24 BCx: Sent     6/13 Repeat Sputum (tracheal aspirate): Acinetobacter calcoaceticus/baumannii complex  Previous cultures:    5/31 Sputum (tracheal aspirate): Acinetobacter  calcoaceticus/baumannii complex    5/28 Sputum: Normal respiratory flora    5/27 BCx: NG final     5/27 UCx: NG final    5/27 MRSA PCR: Negative  Thank you for allowing pharmacy to be a part of this patient's care.  Geraldo Docker 03/23/2021 12:34 PM

## 2021-03-23 NOTE — Progress Notes (Addendum)
Speech Language Pathology Treatment: Nada Boozer Speaking valve  Patient Details Name: Eddie Chapman MRN: [id number] DOB: December 08, 1960 Today's Date: 03/23/2021 Time: 0915-0940 SLP Time Calculation (min) (ACUTE ONLY): 25 min  Assessment / Plan / Recommendation Clinical Impression  Pt seen for ongoing assessment of toleration for wear/use of Passy-muir valve(PMV) today. Pt has had a lengthy hospitalization and difficulty w/ tracheal secretions; agitation/confusion. Pt does have Baseline Cognitive decline sec. to TBI in 2016 per Baseline History and family report. Pt's trach was recently downsized to a Shiley #6, XLT, Cuffed on 03/20/2021. He has tolerated trach collar weans intermittently of ~5-6 hour weans w/ O2 support at 28% FiO2 per NSG/RT. Yesterday, he was returned to vent support d/t increased HR and confusion.  Pt is on nocturnal vent support thus the need for a Cuffed trach. Tracheal secretions min+ w/ PRN suctioning. Pt resting in bed. Pt engaged in gestures and mouthing for communication. Pt has been on trach collar wean for ~3 hours this morning; cuff deflated Baseline.   Explained the use and wear of the PMV to pt; trach and stoma area inspected; ensured Cuff was deflated. RR: 21-38, O2 sats 90-92%, HR 120-132. Min accessory muscle breathing noted. Encouraged pt to breathing slowly, calmly. Finger occlusion trials revealed strong, appropriate phonations. W/ cuff deflation tolerated, PMV was placed to allow pt to use verbal engagement more naturally and also allow pt to hopefully calm his breathing more naturally. Pt was able to immediately and vervbally respond to this SLP but w/ continued accessory muscle breathing and not much change in RR/HR or O2 sats. Verbal communication was appropriate; pt indicated wants/needs. Min anxiousness noted; pt was distracted. Verbalizations and conversation were c/b grossly adequate volume and intelligibility when adequate breath support was present, though often  when pt used rushed, run-on speech, decreased breath support w/ dysphonia noted. Encouraged pt to focus on slowing down and taking his time to share thoughts and take extra breaths in order to support his speech/volume. Continued to note loss of breath support and accessory muscle breathing during his conversation. PMV placement was removed to allow pt to rest; w/ brief replacement. D/t continued presentation in ANS, PMV tx was stopped to allow pt to rest. Suspect could be impact from new afebrile and inc'd wbc. Pt does have a Baseline of anxiety per GF. PMV was removed at ~20-25 mins to allow pt to rest.     Pt does not appear to adequately tolerate PMV placement today - overt respiratory effort and accessory muscle use during respirations at rest and w/ PMV. ANS did not remain stable for PMV wear/use. Much education was given on pt's presentation w/ PMV use/wear today and the poor tolerance. NSG and MD updated at Rounds. ST will f/u when pt's medical status has improved for safe wear of PMV during strong TC wean.      HPI HPI: Pt is a 60 yo M with a complex medical history including hx of cerebral aneurysm, PVD, HFrEF, pulmonary htn, morbid Obesity, OSA, GERD, hx of TBI, w/ recent admit in 09/2020 when he was found Hypoxic and unresponsive. Pt has slower mentation and decision making -- this is Baseline s/p accident when he fell off a roof in 2016 per GF and Father.  Pt was admitted to [facility] ED on 02/23/21 with c/o of confusion & SOB for at least 3 days. Pt was admitted to CCU for ARF. He was orally intubated on 02/28/2021; tracheostomy on 03/07/21 w/ downsize to a #6 shiley  XLT on 03/17/2021; trach collar wean initiated/tolerated on 03/20/2021. Current NGT in place for enteral feedings.      SLP Plan  Continue with current plan of care       Recommendations  Diet recommendations: NPO Medication Administration: Via alternative means      Patient may use Passy-Muir Speech Valve: with SLP only (at this  time)         General recommendations:  (PT/OT ongoing) Oral Care Recommendations: Oral care QID;Staff/trained caregiver to provide oral care Follow up Recommendations: Inpatient Rehab (TBD) SLP Visit Diagnosis: Aphonia (R49.1) (tracheostomy) Plan: Continue with current plan of care       GO                  Orinda Kenner, MS, [place] [facility] [phone] [facility] 03/23/2021, 10:54 AM

## 2021-03-23 NOTE — Progress Notes (Signed)
NAME:  Eddie Chapman, MRN:  [id number], DOB:  03-26-1961, LOS: 57 ADMISSION DATE:  02/23/2021  Brief Pt Description / Synopsis:  61 yo morbidly obese male with acute on chronic hypoxic and hypercapnic respiratory failure due to [place] and AECOPD, along with severe toxic metabolic encephalopathy.  Failure to wean from vent requiring Tracheostomy on 03/07/2021.  History of Present Illness:   Patient has a complex comorbid history including systolic CHF chronically, pulmonary hypertension, obstructive sleep apnea, chronic hypoxemia with respiratory failure, obesity hypoventilation syndrome with thoracic restriction, hormonal dysfunction with hypotestosteronism, dyslipidemia, metabolic syndrome, previous subarachnoid hemorrhage, history of traumatic thoracic spinal injury, depression.  Patient was admitted in January to the intensive care unit with altered mental status acute hypoxemic and hypercapnic respiratory failure with metabolic encephalopathy and left lower lobe severe pneumonia.  Throughout hospitalization he was noted to have lethargy with hypercapnic encephalopathy and became unresponsive a few times even postextubation while on medical floor.  Patient has never smoked in the past.   On this admission he is poorly responsive with hypercapnic hypoxemic respiratory failure          Events:      02/24/21- Patient responded well to mechanical ventilation with improvement in hypercapnia on ABG this am.  Seems that he had THC induced apnea/hypopnea based on laboratory testing.  There is bibasilar atelectasis induced severe hypoxemia, recruitment Metaneb therapy has been ordered. Plan to repeat CXR this afternoon with SBT today.  Reviewed plan with RN and RT today.     02/25/21- patient was unable to pass SBT today. He was able to come off propofol with RASS-0 and is weaned down on levophed.     02/26/21- patient continues to require levophed, he has been difficult  to wean. I have asked ID for consultation due to concern for sepsis possibly due to CAP. Unable to perform SBT today. Family at bedside.   02/27/21- patient on PRVC with heavy secretions, difficulty weaning with high narcotic tolerance. Patient had suicide attempt with requirement for psych.   6/1 failing to wean from vent, DNR status 6/2 failed to wean from vent, severe hypoxia 6/3 severe hypoxia, failure to wean from vent, needs Dr [facility], ENT consulted 6/4 failure to wean from  6/6 failure to wean from vent, plan for [facility] 6/7 failure to wean from vent 6/8 plan for [facility] TODAY 6/9 left foot cyanosis, vasc surgery consulted 6/10: Agitation overnight, added oxycodone, valium, seroquel.  SBT in progress.  Working with PT 6/11: Less agitated, however, did not tolerate SBT today 6/12: Dilaudid being weaned off, less agitated.  Can actually speak with trach.  Tolerating SBT 03/12/21- patient febrile, Trache aspirate sent for viral workup. Patient with anasarca on examination. Adequate UOP.  On PRVC 35%, GCS3T.  03/13/21- TCC having difficulty finding CIR placement.  There is RLE swelling and hypothermia and DVT study was ordered, he is with severe anxiety during SBT today and we reviewed MAR with addition of Abilify due to no QTC effect.   03/14/21- patient is awake, he is attempting again for trache collar trial today.  His secretions are improved, he is vocalizing and trying to write for communication.  03/15/21- patient is alert he communicates via gesturing and attempts to vocalize.  Still in withdrawal and on precedex but calm and appropriate.  Plan is to wean precedex and get to PT/OT.  03/16/21- patient is improved mentation, plan for PMV and SLP with ongoing PNMR transfer for recovery.  He will need  psych eval at some point due to suicide attempt 03/17/21- patient is speaking via gesturing and writing.  Trach changed to XLT 6.  Ongoing plan for CIR.   03/18/21- patient continue to improve, he will be  able to join PT/OT with plan to move to [facility] inpatient rehab asap.  03/19/21- SBT vs Trach collar trials during day, rest on vent at night.  Lasix x1 today 03/20/21-Pts current vent settings SBT 15/5 and tolerating well will attempt to trach collar  03/21/21- Tolerating Trach collar trials, work with PT/OT 03/22/21- Continues to tolerate Trach collar trial during day. Speech consulted for PMV. New Leukocytosis (WBC 15.5) and low grade fever.  CXR without new infiltrate. No foley, and denies dysuria.  Plan to remove PICC line. 03/23/21- Persistent Low grade fever (t max 100.6), WBC slightly improved to 14.5. Meets SIRS Criteria ~Sepsis workup in progress, placed on empiric Cefepime & Vancomycin, tolerating TC trials during day.  Holding Lasix today due to soft BP and worsening Creatinine   Micro Data:  5/27: SARS-CoV-2 and influenza PCR>> negative 5/27: Blood culture x2>> no growth 5/27: Urine>> no growth 5/27: MRSA PCR>> negative 5/28: Sputum>> normal respiratory flora 5/30: Respiratory viral panel>> negative 5/31: SPUTUM CULTURE >> ACINETOBACTER CALCOACETICUS/BAUMANNII COMPLEX  6/13: Tracheal aspirate>>ACINETOBACTER CALCOACETICUS/BAUMANNII COMPLEX  6/24: Blood culture x2>> 6/24: Tracheal aspirate>>     Antimicrobials:  Azithromycin 5/27>>5/29 Ceftriaxone 5/27>>5/29 Vancomycin 5/29>>5/30 Zosyn 5/29>>6/1 Unasyn 6/1>>6/10 Cefepime 6/24>> Vancomycin 6/24>>  Antibiotics Given (last 72 hours)     None      Scheduled Meds:  ARIPiprazole  5 mg Per Tube Daily   chlorhexidine gluconate (MEDLINE KIT)  15 mL Mouth Rinse BID   Chlorhexidine Gluconate Cloth  6 each Topical Daily   cloNIDine  0.1 mg Transdermal Weekly   enoxaparin (LOVENOX) injection  0.5 mg/kg Subcutaneous Q24H   feeding supplement (PROSource TF)  90 mL Per Tube TID   free water  300 mL Per Tube Q4H   insulin aspart  0-15 Units Subcutaneous Q4H   mouth rinse  15 mL Mouth Rinse 10 times per day   metoprolol tartrate  25  mg Per Tube BID   pantoprazole sodium  40 mg Per Tube Daily   polyethylene glycol  17 g Per Tube Daily   senna-docusate  1 tablet Per Tube BID   sodium chloride flush  10-40 mL Intracatheter Q12H   Continuous Infusions:  sodium chloride Stopped (03/11/21 1855)   feeding supplement (VITAL 1.5 CAL) 65 mL/hr at 03/23/21 0135   PRN Meds:.acetaminophen, bisacodyl, clonazepam, docusate sodium, glycopyrrolate, hydrALAZINE, labetalol, midazolam, oxyCODONE, polyethylene glycol, polyvinyl alcohol, sodium chloride flush, traZODone    Objective   Blood pressure 105/81, pulse (!) 120, temperature (!) 100.6 F (38.1 C), temperature source Oral, resp. rate (!) 42, height '5\' 10"'  (1.778 m), weight (!) 136.1 kg, SpO2 91 %.    Vent Mode: PRVC FiO2 (%):  [35 %-60 %] 60 % Set Rate:  [15 bmp] 15 bmp Vt Set:  [550 mL] 550 mL PEEP:  [5 cmH20] 5 cmH20   Intake/Output Summary (Last 24 hours) at 03/23/2021 1205 Last data filed at 03/23/2021 1100 Gross per 24 hour  Intake 3343.42 ml  Output 3315 ml  Net 28.42 ml    Filed Weights   03/21/21 0500 03/22/21 0500 03/23/21 0439  Weight: (!) 148.5 kg 135.1 kg (!) 136.1 kg      REVIEW OF SYSTEMS Positives in BOLD: Gen: Denies fever, chills, weight change, fatigue, night sweats HEENT: Denies blurred  vision, double vision, hearing loss, tinnitus, sinus congestion, rhinorrhea, sore throat, neck stiffness, dysphagia PULM: Denies shortness of breath, cough, sputum production, hemoptysis, wheezing CV: Denies chest pain, edema, orthopnea, paroxysmal nocturnal dyspnea, palpitations GI: Denies abdominal pain, nausea, vomiting, diarrhea, hematochezia, melena, constipation, change in bowel habits GU: Denies dysuria, hematuria, polyuria, oliguria, urethral discharge Endocrine: Denies hot or cold intolerance, polyuria, polyphagia or appetite change Derm: Denies rash, dry skin, scaling or peeling skin change Heme: Denies easy bruising, bleeding, bleeding gums Neuro:  Denies headache, numbness, weakness, slurred speech, loss of memory or consciousness    PHYSICAL EXAMINATION:  GENERAL: obese male sitting in bed, on trach collar trial,  resting, NAD  NECK: Supple.  No JVD, tracheostomy midline PULMONARY: coarse breath sounds throughout, no wheezing or rales noted, even, mild tachypnea CARDIOVASCULAR: Tachycardia, Regular rhythm(ST on telemetry), s1s2, no M/R/G GASTROINTESTINAL:Massively,obese,soft, nontender, nondistended, no guarding or rebound tenderness, bowel sounds positive x4 MUSCULOSKELETAL: Normal bulk and tone, no deformities, 2+ edema BLE  NEUROLOGIC: Awake and alert, moves all extremities to commands,  no focal deficits, difficult to access orientation due to trach SKIN: Warm and dry.  Bilateral feet mild erythema present. No obvious rashes, lesions, ulcerations  Assessment / Plan:    Acute on Chronic Hypoxic and Hypercapnic Respiratory Failure in the setting of ACINOTOBACTER SPECIES PNEUMONIA-completed abx course and AECOPD -S/P tracheostomy on 03/07/21 -Wean FiO2 and PEEP as tolerated to maintain O2 sats 88 to 92% -Spontaneous breathing trial/trach collar trials as tolerated -Speech consulted for evaluation of PMV  -Rest on Ventilator during night -Follow intermittent CXR and ABG as needed -Bronchodilators and budesonide nebs -Hold Lasix today 03/23/21 due to soft BP and worsening Creatinine              ACINETOBACTER CALCOACETICUS/BAUMANNII COMPLEX PNEUMONIA>>treated Meets SIRS Criteria (Leukocytosis w/ WBC 14.5, HR  118, RR 26) -Monitor fever curve -Trend WBCs  -Check Procalcitonin ~ 0.48 -Follow cultures as above -s/p course of Unasyn -Repeat CXR on 03/22/21 without evidence of pneumonia -PICC line removed 03/22/21 -Obtain Blood culture and tracheal aspirate today 6/24 -Will place on empiric Cefepime and Vancomycin for now pending cultures and sensitivities  Septic Shock>>resolved Hypertension -Continuous cardiac  monitoring -Maintain MAP >65 -Continue clonidine patch and Metoprolol  Hypernatremia>>resolved Mild Hypokalemia>>resolved -Monitor I&O's / urinary output -Follow BMP -Ensure adequate renal perfusion -Avoid nephrotoxic agents as able -Replace electrolytes as indicated -Pharmacy following for assistance with electrolyte replacement -Continue free water flushes to 300 ml q4h   Acute toxic metabolic encephalopathy>>resolved  Reported Suicide Attempt (wife reports prior to admission patient was holding gun in his hand while in backyard and stated suicidal ideation) -Avoid sedating medications as able -Continue Abilify -PRN Klonopin, Oxycodone -Provide supportive care, encourage family presence -Promote normal sleep/wake cycle, prn trazodone  -PT/OT consults   Best practice (right click and "Reselect all SmartList Selections" daily)  Diet:  Tube Feed  Pain/Anxiety/Delirium protocol (if indicated): Yes (RASS goal 0 to -1 ) VAP protocol (if indicated): Yes DVT prophylaxis: Lovenox GI prophylaxis: PPI Glucose control:  SSI Yes Central venous access:  No Arterial line:  N/A Foley:  Yes and still needed (frequent incontinence with skin breakdown) Mobility:  OOB as tolerated PT consulted: yes Code Status:  DNR Disposition: ICU     Labs   CBC: Recent Labs  Lab 03/19/21 0513 03/20/21 0454 03/21/21 0444 03/22/21 0424 03/23/21 0613  WBC 11.6* 9.2 9.1 15.5* 14.5*  NEUTROABS 9.7* 7.2 6.9 13.0* 11.0*  HGB 12.5* 11.5* 10.9* 13.6 12.8*  HCT 40.5 38.5* 33.9* 40.8 38.6*  MCV 105.5* 108.5* 104.0* 98.3 98.0  PLT 197 168 147* 207 174     Basic Metabolic Panel: Recent Labs  Lab 03/18/21 0522 03/19/21 0513 03/20/21 0454 03/21/21 0444 03/21/21 0755 03/22/21 0424 03/23/21 0613  NA 157* 155* 154* 138 145 144 140  K 3.1* 3.4* 3.7 3.1* 3.5 3.4* 3.7  CL 118* 114* 112* 99 103 99 95*  CO2 32 33* 35* 33* 35* 34* 34*  GLUCOSE 124* 146* 145* 449* 156* 167* 145*  BUN 51* 31* 31* '21  22 19 ' 36*  CREATININE 1.41* 1.04 0.94 0.89 0.93 0.80 1.58*  CALCIUM 8.7* 8.6* 8.6* 7.9* 8.3* 9.3 8.6*  MG 2.4 2.4 2.5* 1.8  --  1.9 1.9  PHOS 3.8 2.6  --  2.3*  --  2.3* 4.0    GFR: Estimated Creatinine Clearance: 68.2 mL/min (A) (by C-G formula based on SCr of 1.58 mg/dL (H)). Recent Labs  Lab 03/20/21 0454 03/21/21 0444 03/22/21 0424 03/23/21 0613 03/23/21 0944  PROCALCITON  --   --   --   --  0.48  WBC 9.2 9.1 15.5* 14.5*  --      Liver Function Tests: Recent Labs  Lab 03/17/21 0356  ALBUMIN 3.3*    No results for input(s): LIPASE, AMYLASE in the last 168 hours. No results for input(s): AMMONIA in the last 168 hours.  ABG    Component Value Date/Time   PHART 7.40 03/02/2021 2000   PCO2ART 69 (HH) 03/02/2021 2000   PO2ART 137 (H) 03/02/2021 2000   HCO3 42.7 (H) 03/02/2021 2000   O2SAT 99.1 03/02/2021 2000      Coagulation Profile: No results for input(s): INR, PROTIME in the last 168 hours.  Cardiac Enzymes: No results for input(s): CKTOTAL, CKMB, CKMBINDEX, TROPONINI in the last 168 hours.  HbA1C: Hgb A1c MFr Bld  Date/Time Value Ref Range Status  03/02/2021 04:52 AM 5.9 (H) 4.8 - 5.6 % Final    Comment:    (NOTE)         Prediabetes: 5.7 - 6.4         Diabetes: >6.4         Glycemic control for adults with diabetes: <7.0     CBG: Recent Labs  Lab 03/22/21 1954 03/22/21 2308 03/23/21 0418 03/23/21 0715 03/23/21 1116  GLUCAP 144* 132* 150* 132* 134*     Allergies Allergies  Allergen Reactions   Divalproex Sodium Other (See Comments)    Elevated liver enzymes   Valproic Acid     unknown    Critical Care Time: 38 minutes   Darel Hong, AGACNP-BC [facility] Prefer epic messenger for cross cover needs If after hours, please call E-link

## 2021-03-23 NOTE — Progress Notes (Signed)
Physical Therapy Treatment Patient Details Name: Eddie Chapman MRN: [id number] DOB: [birth year] Today's Date: 03/23/2021    History of Present Illness ERCOLE GEORG is a 60 y.o. with a complex medical history including systolic CHF chronically, pulmonary hypertension, obstructive sleep apnea, chronic hypoxemia with respiratory failure, obesity hypoventilation syndrome with thoracic restriction, hormonal dysfunction with hypotestosteronism, dyslipidemia, metabolic syndrome, previous subarachnoid hemorrhage, history of traumatic thoracic spinal injury, & depression. He presented to [facility] ED on 02/23/21 with c/o of confusion & SOB for at least 3 days. Pt was admitted to CCU for ARF. he failed extubation and had trach placed on 03/07/21.    PT Comments    Patient alert, agreeable to PT with minimal encouragement. Reported mild-moderate pain in low back/hips. The patient demonstrated improved sitting tolerance and standing tolerance this session, though pt did fatigue with activity. Bed mobility performed modAx2, and sit <> stand twice during session with minAx2 handheld assist. Able to take some lateral steps at EOB with PT assist to weight shift/clear foot from floor. Returned to bed, all needs in reach. The patient would benefit from further skilled PT intervention to continue to progress towards goals. Recommendation remains appropriate.      Follow Up Recommendations  CIR     Equipment Recommendations  Other (comment)    Recommendations for Other Services       Precautions / Restrictions Precautions Precautions: Fall Precaution Comments: trach collar Restrictions Weight Bearing Restrictions: No    Mobility  Bed Mobility Overal bed mobility: Needs Assistance Bed Mobility: Sit to Supine;Supine to Sit     Supine to sit: +2 for physical assistance;HOB elevated;Mod assist Sit to supine: Mod assist;+2 for physical assistance   General bed mobility comments: pt able to scoot up in bed  without physical assist    Transfers Overall transfer level: Needs assistance Equipment used: 2 person hand held assist Transfers: Sit to/from Stand Sit to Stand: Min assist;+2 physical assistance         General transfer comment: uni-bilateral knee block  Ambulation/Gait   Gait Distance (Feet):  (1+1) Assistive device: 2 person hand held assist       General Gait Details: performed twice, two person handheld assist, assist needed for LLE lateral step   Stairs             Wheelchair Mobility    Modified Rankin (Stroke Patients Only)       Balance Overall balance assessment: Needs assistance Sitting-balance support: Feet supported;Bilateral upper extremity supported Sitting balance-Leahy Scale: Fair Sitting balance - Comments: increased assist for seated balance this date, seemingly more fatigued/less motivated than in previous sessions Postural control: Posterior lean Standing balance support: Bilateral upper extremity supported Standing balance-Leahy Scale: Poor                              Cognition Arousal/Alertness: Awake/alert Behavior During Therapy: WFL for tasks assessed/performed Overall Cognitive Status: Within Functional Limits for tasks assessed                         Following Commands: Follows one step commands consistently   Awareness: Anticipatory;Emergent Problem Solving: Requires verbal cues;Requires tactile cues        Exercises General Exercises - Lower Extremity Ankle Circles/Pumps: AROM;Strengthening;Both;10 reps Quad Sets: AROM;Strengthening;Right;10 reps Short Arc Quad: AROM;Strengthening;Left;10 reps    General Comments        Pertinent Vitals/Pain Pain Assessment: Faces  Faces Pain Scale: Hurts little more Pain Location: generalized low back pain Pain Descriptors / Indicators: Aching;Constant Pain Intervention(s): Monitored during session;Repositioned;Limited activity within patient's  tolerance    Home Living                      Prior Function            PT Goals (current goals can now be found in the care plan section) Progress towards PT goals: Progressing toward goals    Frequency    Min 2X/week      PT Plan Current plan remains appropriate    Co-evaluation              AM-PAC PT "6 Clicks" Mobility   Outcome Measure  Help needed turning from your back to your side while in a flat bed without using bedrails?: Total Help needed moving from lying on your back to sitting on the side of a flat bed without using bedrails?: Total Help needed moving to and from a bed to a chair (including a wheelchair)?: Total Help needed standing up from a chair using your arms (e.g., wheelchair or bedside chair)?: A Lot Help needed to walk in hospital room?: A Lot Help needed climbing 3-5 steps with a railing? : A Lot 6 Click Score: 9    End of Session Equipment Utilized During Treatment: Oxygen (trach collar) Activity Tolerance: Patient tolerated treatment well Patient left: in bed;with call bell/phone within reach;Other (comment) Nurse Communication: Mobility status PT Visit Diagnosis: Muscle weakness (generalized) (M62.81);Difficulty in walking, not elsewhere classified (R26.2);Unsteadiness on feet (R26.81)     Time: 5501-5868 PT Time Calculation (min) (ACUTE ONLY): 33 min  Charges:  $Therapeutic Exercise: 23-37 mins                     Lieutenant Diego PT, DPT 4:05 PM,03/23/21

## 2021-03-24 DIAGNOSIS — D72829 Elevated white blood cell count, unspecified: Secondary | ICD-10-CM

## 2021-03-24 DIAGNOSIS — D72828 Other elevated white blood cell count: Secondary | ICD-10-CM

## 2021-03-24 DIAGNOSIS — R509 Fever, unspecified: Secondary | ICD-10-CM

## 2021-03-24 LAB — BASIC METABOLIC PANEL
Anion gap: 9 (ref 5–15)
BUN: 43 mg/dL — ABNORMAL HIGH (ref 8–23)
CO2: 35 mmol/L — ABNORMAL HIGH (ref 22–32)
Calcium: 8.4 mg/dL — ABNORMAL LOW (ref 8.9–10.3)
Chloride: 94 mmol/L — ABNORMAL LOW (ref 98–111)
Creatinine, Ser: 1.31 mg/dL — ABNORMAL HIGH (ref 0.61–1.24)
GFR, Estimated: >60 mL/min (ref >60)
Glucose, Bld: 159 mg/dL — ABNORMAL HIGH (ref 70–99)
Potassium: 3.8 mmol/L (ref 3.5–5.1)
Sodium: 138 mmol/L (ref 135–145)

## 2021-03-24 LAB — CBC WITH DIFFERENTIAL/PLATELET
Abs Immature Granulocytes: 0.19 10*3/uL — ABNORMAL HIGH (ref 0.00–0.07)
Basophils Absolute: 0 10*3/uL (ref 0.0–0.1)
Basophils Relative: 0 %
Eosinophils Absolute: 0.3 10*3/uL (ref 0.0–0.5)
Eosinophils Relative: 2 %
HCT: 37.5 % — ABNORMAL LOW (ref 39.0–52.0)
Hemoglobin: 12.5 g/dL — ABNORMAL LOW (ref 13.0–17.0)
Immature Granulocytes: 1 %
Lymphocytes Relative: 7 %
Lymphs Abs: 1 10*3/uL (ref 0.7–4.0)
MCH: 32.7 pg (ref 26.0–34.0)
MCHC: 33.3 g/dL (ref 30.0–36.0)
MCV: 98.2 fL (ref 80.0–100.0)
Monocytes Absolute: 1.8 10*3/uL — ABNORMAL HIGH (ref 0.1–1.0)
Monocytes Relative: 13 %
Neutro Abs: 10.5 10*3/uL — ABNORMAL HIGH (ref 1.7–7.7)
Neutrophils Relative %: 77 %
Platelets: 170 10*3/uL (ref 150–400)
RBC: 3.82 MIL/uL — ABNORMAL LOW (ref 4.22–5.81)
RDW: 13.5 % (ref 11.5–15.5)
WBC: 13.7 10*3/uL — ABNORMAL HIGH (ref 4.0–10.5)
nRBC: 0 % (ref 0.0–0.2)

## 2021-03-24 LAB — PHOSPHORUS: Phosphorus: 3.3 mg/dL (ref 2.5–4.6)

## 2021-03-24 LAB — GLUCOSE, CAPILLARY
Glucose-Capillary: 128 mg/dL — ABNORMAL HIGH (ref 70–99)
Glucose-Capillary: 137 mg/dL — ABNORMAL HIGH (ref 70–99)
Glucose-Capillary: 154 mg/dL — ABNORMAL HIGH (ref 70–99)
Glucose-Capillary: 156 mg/dL — ABNORMAL HIGH (ref 70–99)
Glucose-Capillary: 167 mg/dL — ABNORMAL HIGH (ref 70–99)
Glucose-Capillary: 179 mg/dL — ABNORMAL HIGH (ref 70–99)

## 2021-03-24 LAB — MAGNESIUM: Magnesium: 2.1 mg/dL (ref 1.7–2.4)

## 2021-03-24 LAB — PROCALCITONIN: Procalcitonin: 0.35 ng/mL

## 2021-03-24 MED ORDER — VANCOMYCIN HCL 1750 MG/350ML IV SOLN
1750.0000 mg | INTRAVENOUS | Status: DC
  Administered 2021-03-24: 1750 mg via INTRAVENOUS
  Filled 2021-03-24 (×2): qty 350

## 2021-03-24 NOTE — Progress Notes (Signed)
[place] for Electrolyte Monitoring and Replacement   Recent Labs: Potassium (mmol/L)  Date Value  03/24/2021 3.8  05/04/2014 4.3   Magnesium (mg/dL)  Date Value  03/24/2021 2.1   Calcium (mg/dL)  Date Value  03/24/2021 8.4 (L)   Calcium, Total (mg/dL)  Date Value  05/04/2014 9.1   Albumin (g/dL)  Date Value  03/17/2021 3.3 (L)   Phosphorus (mg/dL)  Date Value  03/24/2021 3.3   Sodium (mmol/L)  Date Value  03/24/2021 138  05/04/2014 140    Assessment: 60 y/o male with a h/o HTN, HLD, OSA, CHF, MDD, GERD, cerebral aneurysm and pulmonary HTN who is admitted with CAP, COPD exacerbation and encephalopathy, s/p tracheostomy and NGT 6/8  Nutrition: vital at 65 mL/hr, PROSource 90 mL QID>TID   Goal of Therapy:  Electrolytes WNL  Plan:  No replacement indicated Follow electrolytes as ordered by team  Tawnya Crook ,PharmD Clinical Pharmacist 03/24/2021 9:10 AM

## 2021-03-24 NOTE — Consult Note (Signed)
Pharmacy Antibiotic Note  Eddie Chapman is a 60 y.o. male admitted on 02/23/2021 with pneumonia. His 5/31 sputum culture (tracheal aspirate) found acinetobacter calcoaceticus/baumannii complex. He was treated with 10 days (6/1-6/10) of Unasyn. On 6/13, repeat sputum cultures were sent and found acinetobacter calcoaceticus/baumannii complex. He became febrile and had elevated WBC on 6/23. Repeat sputum was sent again on 6/24.  Pharmacy has been consulted for vancomycin and cefepime dosing.  Plan: Vancomycin 1750 mg IV Q 24 hrs Goal AUC 400-550 Expected AUC: 465 SCr used: 1.31   Height: 5\' 10"  (177.8 cm) Weight: (!) 136.4 kg (300 lb 11.3 oz) IBW/kg (Calculated) : 73  Temp (24hrs), Avg:99 F (37.2 C), Min:97.9 F (36.6 C), Max:100.1 F (37.8 C)  Recent Labs  Lab 03/20/21 0454 03/21/21 0444 03/21/21 0755 03/22/21 0424 03/23/21 0613 03/24/21 0322  WBC 9.2 9.1  --  15.5* 14.5* 13.7*  CREATININE 0.94 0.89 0.93 0.80 1.58* 1.31*     Estimated Creatinine Clearance: 82.4 mL/min (A) (by C-G formula based on SCr of 1.31 mg/dL (H)).    Allergies  Allergen Reactions   Divalproex Sodium Other (See Comments)    Elevated liver enzymes   Valproic Acid     unknown    Antimicrobials this admission: Cefepime 6/24 >> Vancomycin 6/24 >> ------ Amp/sulbactam 6/1 >> 6/10 Zosyn 5/29 >> 5/31 Vancomycin 5/29 >> 5/30 Azithromycin 5/27 >> 5/29 Ceftriaxone 5/27 >> 5/28   Microbiology results: Recent cultures:    6/24 Repeat Sputum (tracheal aspirate): pending    6/24 BCx: NG     6/13 Repeat Sputum (tracheal aspirate): Acinetobacter calcoaceticus/baumannii complex  Previous cultures:    5/31 Sputum (tracheal aspirate): Acinetobacter calcoaceticus/baumannii complex    5/28 Sputum: Normal respiratory flora    5/27 BCx: NG final     5/27 UCx: NG final    5/27 MRSA PCR: Negative  Thank you for allowing pharmacy to be a part of this patient's care.  Tawnya Crook, PharmD 03/24/2021  9:26 AM

## 2021-03-24 NOTE — Progress Notes (Signed)
NAME:  Eddie Chapman, MRN:  [id number], DOB:  1960/10/10, LOS: 36 ADMISSION DATE:  02/23/2021  Brief Pt Description / Synopsis:  60 yo morbidly obese male with acute on chronic hypoxic and hypercapnic respiratory failure due to [place] and AECOPD, along with severe toxic metabolic encephalopathy.  Failure to wean from vent requiring Tracheostomy on 03/07/2021.  History of Present Illness:   Patient has a complex comorbid history including systolic CHF chronically, pulmonary hypertension, obstructive sleep apnea, chronic hypoxemia with respiratory failure, obesity hypoventilation syndrome with thoracic restriction, hormonal dysfunction with hypotestosteronism, dyslipidemia, metabolic syndrome, previous subarachnoid hemorrhage, history of traumatic thoracic spinal injury, depression.  Patient was admitted in January to the intensive care unit with altered mental status acute hypoxemic and hypercapnic respiratory failure with metabolic encephalopathy and left lower lobe severe pneumonia.  Throughout hospitalization he was noted to have lethargy with hypercapnic encephalopathy and became unresponsive a few times even postextubation while on medical floor.  Patient has never smoked in the past.   On this admission he is poorly responsive with hypercapnic hypoxemic respiratory failure          Events:      02/24/21- Patient responded well to mechanical ventilation with improvement in hypercapnia on ABG this am.  Seems that he had THC induced apnea/hypopnea based on laboratory testing.  There is bibasilar atelectasis induced severe hypoxemia, recruitment Metaneb therapy has been ordered. Plan to repeat CXR this afternoon with SBT today.  Reviewed plan with RN and RT today.     02/25/21- patient was unable to pass SBT today. He was able to come off propofol with RASS-0 and is weaned down on levophed.     02/26/21- patient continues to require levophed, he has been difficult  to wean. I have asked ID for consultation due to concern for sepsis possibly due to CAP. Unable to perform SBT today. Family at bedside.   02/27/21- patient on PRVC with heavy secretions, difficulty weaning with high narcotic tolerance. Patient had suicide attempt with requirement for psych.   6/1 failing to wean from vent, DNR status 6/2 failed to wean from vent, severe hypoxia 6/3 severe hypoxia, failure to wean from vent, needs [facility], ENT consulted 6/4 failure to wean from  6/6 failure to wean from vent, plan for [facility] 6/7 failure to wean from vent 6/8 plan for [facility] TODAY 6/9 left foot cyanosis, vasc surgery consulted 6/10: Agitation overnight, added oxycodone, valium, seroquel.  SBT in progress.  Working with PT 6/11: Less agitated, however, did not tolerate SBT today 6/12: Dilaudid being weaned off, less agitated.  Can actually speak with trach.  Tolerating SBT 03/12/21- patient febrile, Trache aspirate sent for viral workup. Patient with anasarca on examination. Adequate UOP.  On PRVC 35%, GCS3T.  03/13/21- TCC having difficulty finding CIR placement.  There is RLE swelling and hypothermia and DVT study was ordered, he is with severe anxiety during SBT today and we reviewed MAR with addition of Abilify due to no QTC effect.   03/14/21- patient is awake, he is attempting again for trache collar trial today.  His secretions are improved, he is vocalizing and trying to write for communication.  03/15/21- patient is alert he communicates via gesturing and attempts to vocalize.  Still in withdrawal and on precedex but calm and appropriate.  Plan is to wean precedex and get to PT/OT.  03/16/21- patient is improved mentation, plan for PMV and SLP with ongoing PNMR transfer for recovery.  He will need  psych eval at some point due to suicide attempt 03/17/21- patient is speaking via gesturing and writing.  Trach changed to XLT 6.  Ongoing plan for CIR.   03/18/21- patient continue to improve, he will be  able to join PT/OT with plan to move to [facility] inpatient rehab asap.  03/19/21- SBT vs Trach collar trials during day, rest on vent at night.  Lasix x1 today 03/20/21-Pts current vent settings SBT 15/5 and tolerating well will attempt to trach collar  03/21/21- Tolerating Trach collar trials, work with PT/OT 03/22/21- Continues to tolerate Trach collar trial during day. Speech consulted for PMV. New Leukocytosis (WBC 15.5) and low grade fever.  CXR without new infiltrate. No foley, and denies dysuria.  Plan to remove PICC line. 03/23/21- Persistent Low grade fever (t max 100.6), WBC slightly improved to 14.5. Meets SIRS Criteria ~Sepsis workup in progress, placed on empiric Cefepime & Vancomycin, tolerating TC trials during day.  Holding Lasix today due to soft BP and worsening Creatinine   Micro Data:  5/27: SARS-CoV-2 and influenza PCR>> negative 5/27: Blood culture x2>> no growth 5/27: Urine>> no growth 5/27: MRSA PCR>> negative 5/28: Sputum>> normal respiratory flora 5/30: Respiratory viral panel>> negative 5/31: SPUTUM CULTURE >> ACINETOBACTER CALCOACETICUS/BAUMANNII COMPLEX  6/13: Tracheal aspirate>>ACINETOBACTER CALCOACETICUS/BAUMANNII COMPLEX  6/24: Blood culture x2>> 6/24: Tracheal aspirate>>     Antimicrobials:  Azithromycin 5/27>>5/29 Ceftriaxone 5/27>>5/29 Vancomycin 5/29>>5/30 Zosyn 5/29>>6/1 Unasyn 6/1>>6/10 Cefepime 6/24>> Vancomycin 6/24>>  Antibiotics Given (last 72 hours)     Date/Time Action Medication Dose Rate   03/23/21 1338 New Bag/Given   ceFEPIme (MAXIPIME) 2 g in sodium chloride 0.9 % 100 mL IVPB 2 g 200 mL/hr   03/23/21 1437 New Bag/Given   vancomycin (VANCOCIN) IVPB 1000 mg/200 mL premix 1,000 mg 200 mL/hr   03/23/21 1557 New Bag/Given   vancomycin (VANCOREADY) IVPB 1500 mg/300 mL 1,500 mg 150 mL/hr   03/23/21 2203 New Bag/Given   ceFEPIme (MAXIPIME) 2 g in sodium chloride 0.9 % 100 mL IVPB 2 g 200 mL/hr   03/24/21 0504 New Bag/Given   ceFEPIme  (MAXIPIME) 2 g in sodium chloride 0.9 % 100 mL IVPB 2 g 200 mL/hr      Scheduled Meds:  ARIPiprazole  5 mg Per Tube Daily   chlorhexidine gluconate (MEDLINE KIT)  15 mL Mouth Rinse BID   Chlorhexidine Gluconate Cloth  6 each Topical Daily   cloNIDine  0.1 mg Transdermal Weekly   enoxaparin (LOVENOX) injection  0.5 mg/kg Subcutaneous Q24H   feeding supplement (PROSource TF)  90 mL Per Tube TID   free water  300 mL Per Tube Q4H   insulin aspart  0-15 Units Subcutaneous Q4H   mouth rinse  15 mL Mouth Rinse 10 times per day   metoprolol tartrate  25 mg Per Tube BID   pantoprazole sodium  40 mg Per Tube Daily   polyethylene glycol  17 g Per Tube Daily   senna-docusate  1 tablet Per Tube BID   sodium chloride flush  10-40 mL Intracatheter Q12H   Continuous Infusions:  sodium chloride Stopped (03/11/21 1855)   ceFEPime (MAXIPIME) IV 2 g (03/24/21 0504)   feeding supplement (VITAL 1.5 CAL) 65 mL/hr at 03/24/21 0600   vancomycin     PRN Meds:.acetaminophen, bisacodyl, clonazepam, docusate sodium, glycopyrrolate, hydrALAZINE, labetalol, midazolam, oxyCODONE, polyethylene glycol, polyvinyl alcohol, sodium chloride flush, traZODone    Objective   Blood pressure 127/79, pulse 97, temperature 99.8 F (37.7 C), temperature source Oral, resp. rate Marland Kitchen)  37, height '5\' 10"'  (1.778 m), weight (!) 136.4 kg, SpO2 91 %.    Vent Mode: PRVC FiO2 (%):  [35 %-60 %] 35 % Set Rate:  [15 bmp] 15 bmp Vt Set:  [550 mL] 550 mL PEEP:  [5 cmH20] 5 cmH20 Plateau Pressure:  [19 cmH20-23 cmH20] 19 cmH20   Intake/Output Summary (Last 24 hours) at 03/24/2021 1132 Last data filed at 03/24/2021 1000 Gross per 24 hour  Intake 3769.96 ml  Output 1975 ml  Net 1794.96 ml    Filed Weights   03/22/21 0500 03/23/21 0439 03/24/21 0500  Weight: 135.1 kg (!) 136.1 kg (!) 136.4 kg      REVIEW OF SYSTEMS Positives in BOLD: Gen: Denies fever, chills, weight change, fatigue, night sweats HEENT: Denies blurred vision,  double vision, hearing loss, tinnitus, sinus congestion, rhinorrhea, sore throat, neck stiffness, dysphagia PULM: Denies shortness of breath, cough, sputum production, hemoptysis, wheezing CV: Denies chest pain, edema, orthopnea, paroxysmal nocturnal dyspnea, palpitations GI: Denies abdominal pain, nausea, vomiting, diarrhea, hematochezia, melena, constipation, change in bowel habits GU: Denies dysuria, hematuria, polyuria, oliguria, urethral discharge Endocrine: Denies hot or cold intolerance, polyuria, polyphagia or appetite change Derm: Denies rash, dry skin, scaling or peeling skin change Heme: Denies easy bruising, bleeding, bleeding gums Neuro: Denies headache, numbness, weakness, slurred speech, loss of memory or consciousness    PHYSICAL EXAMINATION:  GENERAL: obese male sitting in bed, on trach collar trial,  resting, NAD  NECK: Supple.  No JVD, tracheostomy midline PULMONARY: coarse breath sounds throughout, no wheezing or rales noted, even, mild tachypnea CARDIOVASCULAR: Tachycardia, Regular rhythm(ST on telemetry), s1s2, no M/R/G GASTROINTESTINAL:Massively,obese,soft, nontender, nondistended, no guarding or rebound tenderness, bowel sounds positive x4 MUSCULOSKELETAL: Normal bulk and tone, no deformities, 2+ edema BLE  NEUROLOGIC: Awake and alert, moves all extremities to commands,  no focal deficits, difficult to access orientation due to trach SKIN: Warm and dry.  Bilateral feet mild erythema present. No obvious rashes, lesions, ulcerations  Assessment / Plan:    Acute on Chronic Hypoxic and Hypercapnic Respiratory Failure in the setting of ACINOTOBACTER SPECIES PNEUMONIA-completed abx course and AECOPD -S/P tracheostomy on 03/07/21 -Wean FiO2 and PEEP as tolerated to maintain O2 sats 88 to 92% -Spontaneous breathing trial/trach collar trials as tolerated -Speech consulted for evaluation of PMV  -Rest on Ventilator during night -Follow intermittent CXR and ABG as  needed -Bronchodilators and budesonide nebs -Hold Lasix today 03/23/21 due to soft BP and worsening Creatinine              ACINETOBACTER CALCOACETICUS/BAUMANNII COMPLEX PNEUMONIA>>treated Meets SIRS Criteria (6/24 Leukocytosis w/ WBC 14.5, HR  118, RR 26) -Monitor fever curve -Trend WBCs  -Check Procalcitonin ~ 0.48, now 0.35 -WBC peak 15.5 now 13.7 -Follow cultures as above -s/p course of Unasyn -Repeat CXR on 03/22/21 without evidence of pneumonia -PICC line removed 03/22/21 -[place] blood culture and tracheal aspirate today 6/24 -Maintain  empiric Cefepime and Vancomycin for now pending cultures and sensitivities  Septic Shock>>resolved Hypertension -Continuous cardiac monitoring -Maintain MAP >65 -Continue clonidine patch and Metoprolol as BP allows  Hypernatremia>>resolved Mild Hypokalemia>>resolved Lab Results  Component Value Date   CREATININE 1.31 (H) 03/24/2021   BUN 43 (H) 03/24/2021   NA 138 03/24/2021   K 3.8 03/24/2021   CL 94 (L) 03/24/2021   CO2 35 (H) 03/24/2021    -Monitor I&O's / urinary output -Follow BMP -Ensure adequate renal perfusion -Avoid nephrotoxic agents as able -Replace electrolytes as indicated -Pharmacy following for assistance with electrolyte  replacement -Continue free water flushes to 300 ml q4h   Acute toxic metabolic encephalopathy>>resolved  Reported Suicide Attempt (wife reports prior to admission patient was holding gun in his hand while in backyard and stated suicidal ideation) -Avoid sedating medications as able -Continue Abilify -PRN Klonopin, Oxycodone -Provide supportive care, encourage family presence -Promote normal sleep/wake cycle, prn trazodone  -PT/OT consults   Best practice (right click and "Reselect all SmartList Selections" daily)  Diet:  Tube Feed  Pain/Anxiety/Delirium protocol (if indicated): Yes (RASS goal 0 to -1 ) VAP protocol (if indicated): Yes DVT prophylaxis: Lovenox GI prophylaxis: PPI Glucose  control:  SSI Yes Central venous access:  No Arterial line:  N/A Foley:  Yes and still needed (frequent incontinence with skin breakdown) Mobility:  OOB as tolerated PT consulted: yes Code Status:  DNR Disposition: ICU     Labs   CBC: Recent Labs  Lab 03/20/21 0454 03/21/21 0444 03/22/21 0424 03/23/21 0613 03/24/21 0322  WBC 9.2 9.1 15.5* 14.5* 13.7*  NEUTROABS 7.2 6.9 13.0* 11.0* 10.5*  HGB 11.5* 10.9* 13.6 12.8* 12.5*  HCT 38.5* 33.9* 40.8 38.6* 37.5*  MCV 108.5* 104.0* 98.3 98.0 98.2  PLT 168 147* 207 174 170     Basic Metabolic Panel: Recent Labs  Lab 03/19/21 0513 03/20/21 0454 03/21/21 0444 03/21/21 0755 03/22/21 0424 03/23/21 0613 03/24/21 0322  NA 155* 154* 138 145 144 140 138  K 3.4* 3.7 3.1* 3.5 3.4* 3.7 3.8  CL 114* 112* 99 103 99 95* 94*  CO2 33* 35* 33* 35* 34* 34* 35*  GLUCOSE 146* 145* 449* 156* 167* 145* 159*  BUN 31* 31* '21 22 19 ' 36* 43*  CREATININE 1.04 0.94 0.89 0.93 0.80 1.58* 1.31*  CALCIUM 8.6* 8.6* 7.9* 8.3* 9.3 8.6* 8.4*  MG 2.4 2.5* 1.8  --  1.9 1.9 2.1  PHOS 2.6  --  2.3*  --  2.3* 4.0 3.3    GFR: Estimated Creatinine Clearance: 82.4 mL/min (A) (by C-G formula based on SCr of 1.31 mg/dL (H)). Recent Labs  Lab 03/21/21 0444 03/22/21 0424 03/23/21 0613 03/23/21 0944 03/24/21 0322  PROCALCITON  --   --   --  0.48 0.35  WBC 9.1 15.5* 14.5*  --  13.7*     Liver Function Tests: No results for input(s): AST, ALT, ALKPHOS, BILITOT, PROT, ALBUMIN in the last 168 hours.  No results for input(s): LIPASE, AMYLASE in the last 168 hours. No results for input(s): AMMONIA in the last 168 hours.  ABG    Component Value Date/Time   PHART 7.40 03/02/2021 2000   PCO2ART 69 (HH) 03/02/2021 2000   PO2ART 137 (H) 03/02/2021 2000   HCO3 42.7 (H) 03/02/2021 2000   O2SAT 99.1 03/02/2021 2000      Coagulation Profile: No results for input(s): INR, PROTIME in the last 168 hours.  Cardiac Enzymes: No results for input(s): CKTOTAL,  CKMB, CKMBINDEX, TROPONINI in the last 168 hours.  HbA1C: Hgb A1c MFr Bld  Date/Time Value Ref Range Status  03/02/2021 04:52 AM 5.9 (H) 4.8 - 5.6 % Final    Comment:    (NOTE)         Prediabetes: 5.7 - 6.4         Diabetes: >6.4         Glycemic control for adults with diabetes: <7.0     CBG: Recent Labs  Lab 03/23/21 1645 03/23/21 1932 03/23/21 2318 03/24/21 0433 03/24/21 0732  GLUCAP 152* 136* 165* 167* 179*  Allergies Allergies  Allergen Reactions   Divalproex Sodium Other (See Comments)    Elevated liver enzymes   Valproic Acid     unknown    Critical Care Time devoted to patient care services described in this note is  35 minutes.  Overall, patient is critically ill, prognosis is guarded.   Patient with Multiorgan failure and at high risk for cardiac arrest and death.  Images reviewed directly and interpretation in A&P is my own unless noted Labs reviewed and evaluated as noted in A/P

## 2021-03-25 LAB — GLUCOSE, CAPILLARY
Glucose-Capillary: 109 mg/dL — ABNORMAL HIGH (ref 70–99)
Glucose-Capillary: 110 mg/dL — ABNORMAL HIGH (ref 70–99)
Glucose-Capillary: 128 mg/dL — ABNORMAL HIGH (ref 70–99)
Glucose-Capillary: 132 mg/dL — ABNORMAL HIGH (ref 70–99)
Glucose-Capillary: 139 mg/dL — ABNORMAL HIGH (ref 70–99)
Glucose-Capillary: 163 mg/dL — ABNORMAL HIGH (ref 70–99)

## 2021-03-25 LAB — MAGNESIUM: Magnesium: 2.2 mg/dL (ref 1.7–2.4)

## 2021-03-25 LAB — CBC WITH DIFFERENTIAL/PLATELET
Abs Immature Granulocytes: 0.23 10*3/uL — ABNORMAL HIGH (ref 0.00–0.07)
Basophils Absolute: 0 10*3/uL (ref 0.0–0.1)
Basophils Relative: 0 %
Eosinophils Absolute: 0.3 10*3/uL (ref 0.0–0.5)
Eosinophils Relative: 2 %
HCT: 35.5 % — ABNORMAL LOW (ref 39.0–52.0)
Hemoglobin: 12.1 g/dL — ABNORMAL LOW (ref 13.0–17.0)
Immature Granulocytes: 2 %
Lymphocytes Relative: 8 %
Lymphs Abs: 1.1 10*3/uL (ref 0.7–4.0)
MCH: 33.6 pg (ref 26.0–34.0)
MCHC: 34.1 g/dL (ref 30.0–36.0)
MCV: 98.6 fL (ref 80.0–100.0)
Monocytes Absolute: 1.7 10*3/uL — ABNORMAL HIGH (ref 0.1–1.0)
Monocytes Relative: 13 %
Neutro Abs: 9.9 10*3/uL — ABNORMAL HIGH (ref 1.7–7.7)
Neutrophils Relative %: 75 %
Platelets: 189 10*3/uL (ref 150–400)
RBC: 3.6 MIL/uL — ABNORMAL LOW (ref 4.22–5.81)
RDW: 13.3 % (ref 11.5–15.5)
WBC: 13.3 10*3/uL — ABNORMAL HIGH (ref 4.0–10.5)
nRBC: 0 % (ref 0.0–0.2)

## 2021-03-25 LAB — BASIC METABOLIC PANEL
Anion gap: 9 (ref 5–15)
BUN: 39 mg/dL — ABNORMAL HIGH (ref 8–23)
CO2: 36 mmol/L — ABNORMAL HIGH (ref 22–32)
Calcium: 8.7 mg/dL — ABNORMAL LOW (ref 8.9–10.3)
Chloride: 94 mmol/L — ABNORMAL LOW (ref 98–111)
Creatinine, Ser: 0.92 mg/dL (ref 0.61–1.24)
GFR, Estimated: >60 mL/min (ref >60)
Glucose, Bld: 167 mg/dL — ABNORMAL HIGH (ref 70–99)
Potassium: 3.9 mmol/L (ref 3.5–5.1)
Sodium: 139 mmol/L (ref 135–145)

## 2021-03-25 LAB — PHOSPHORUS: Phosphorus: 2 mg/dL — ABNORMAL LOW (ref 2.5–4.6)

## 2021-03-25 LAB — PROCALCITONIN: Procalcitonin: 0.18 ng/mL

## 2021-03-25 MED ORDER — POTASSIUM & SODIUM PHOSPHATES 280-160-250 MG PO PACK
2.0000 | PACK | ORAL | Status: AC
  Administered 2021-03-25 (×2): 2
  Filled 2021-03-25 (×2): qty 2

## 2021-03-25 MED ORDER — SCOPOLAMINE 1 MG/3DAYS TD PT72
1.0000 | MEDICATED_PATCH | TRANSDERMAL | Status: DC
  Administered 2021-03-25 – 2021-03-31 (×3): 1.5 mg via TRANSDERMAL
  Filled 2021-03-25 (×5): qty 1

## 2021-03-25 MED ORDER — VANCOMYCIN HCL 1250 MG/250ML IV SOLN
1250.0000 mg | Freq: Two times a day (BID) | INTRAVENOUS | Status: DC
  Administered 2021-03-25 – 2021-03-26 (×2): 1250 mg via INTRAVENOUS
  Filled 2021-03-25 (×3): qty 250

## 2021-03-25 NOTE — Progress Notes (Addendum)
NAME:  Eddie Chapman, MRN:  [id number], DOB:  06/26/1961, LOS: 79 ADMISSION DATE:  02/23/2021  BRIEF SYNOPSIS 60 yo morbidly obese male with acute on chronic hypoxic and hypercapnic respiratory failure due to [place] and AECOPD, along with severe toxic metabolic encephalopathy.  Failure to wean from vent requiring Tracheostomy on 03/07/2021.   History of Present Illness:    Patient has a complex comorbid history including systolic CHF chronically, pulmonary hypertension, obstructive sleep apnea, chronic hypoxemia with respiratory failure, obesity hypoventilation syndrome with thoracic restriction, hormonal dysfunction with hypotestosteronism, dyslipidemia, metabolic syndrome, previous subarachnoid hemorrhage, history of traumatic thoracic spinal injury, depression.  Patient was admitted in January to the intensive care unit with altered mental status acute hypoxemic and hypercapnic respiratory failure with metabolic encephalopathy and left lower lobe severe pneumonia.  Throughout hospitalization he was noted to have lethargy with hypercapnic encephalopathy and became unresponsive a few times even postextubation while on medical floor.  Patient has never smoked in the past.   On this admission he is poorly responsive with hypercapnic hypoxemic respiratory failure          Events:      02/24/21- Patient responded well to mechanical ventilation with improvement in hypercapnia on ABG this am.  Seems that he had THC induced apnea/hypopnea based on laboratory testing.  There is bibasilar atelectasis induced severe hypoxemia, recruitment Metaneb therapy has been ordered. Plan to repeat CXR this afternoon with SBT today.  Reviewed plan with RN and RT today.     02/25/21- patient was unable to pass SBT today. He was able to come off propofol with RASS-0 and is weaned down on levophed.     02/26/21- patient continues to require levophed, he has been difficult to wean. I have  asked ID for consultation due to concern for sepsis possibly due to CAP. Unable to perform SBT today. Family at bedside.   02/27/21- patient on PRVC with heavy secretions, difficulty weaning with high narcotic tolerance. Patient had suicide attempt with requirement for psych.   6/1 failing to wean from vent, DNR status 6/2 failed to wean from vent, severe hypoxia 6/3 severe hypoxia, failure to wean from vent, needs [facility], ENT consulted 6/4 failure to wean from  6/6 failure to wean from vent, plan for [facility] 6/7 failure to wean from vent 6/8 plan for [facility] TODAY 6/9 left foot cyanosis, vasc surgery consulted 6/10: Agitation overnight, added oxycodone, valium, seroquel.  SBT in progress.  Working with PT 6/11: Less agitated, however, did not tolerate SBT today 6/12: Dilaudid being weaned off, less agitated.  Can actually speak with trach.  Tolerating SBT 03/12/21- patient febrile, Trache aspirate sent for viral workup. Patient with anasarca on examination. Adequate UOP.  On PRVC 35%, GCS3T. 03/13/21- TCC having difficulty finding CIR placement.  There is RLE swelling and hypothermia and DVT study was ordered, he is with severe anxiety during SBT today and we reviewed MAR with addition of Abilify due to no QTC effect.   03/14/21- patient is awake, he is attempting again for trache collar trial today.  His secretions are improved, he is vocalizing and trying to write for communication. 03/15/21- patient is alert he communicates via gesturing and attempts to vocalize.  Still in withdrawal and on precedex but calm and appropriate.  Plan is to wean precedex and get to PT/OT. 03/16/21- patient is improved mentation, plan for PMV and SLP with ongoing PNMR transfer for recovery.  He will need psych eval at some point  due to suicide attempt 03/17/21- patient is speaking via gesturing and writing.  Trach changed to XLT 6.  Ongoing plan for CIR.   03/18/21- patient continue to improve, he will be able to join PT/OT  with plan to move to [facility] inpatient rehab asap. 03/19/21- SBT vs Trach collar trials during day, rest on vent at night.  Lasix x1 today 03/20/21-Pts current vent settings SBT 15/5 and tolerating well will attempt to trach collar 03/21/21- Tolerating Trach collar trials, work with PT/OT 03/22/21- Continues to tolerate Trach collar trial during day. Speech consulted for PMV. New Leukocytosis (WBC 15.5) and low grade fever.  CXR without new infiltrate. No foley, and denies dysuria.  Plan to remove PICC line. 03/23/21- Persistent Low grade fever (t max 100.6), WBC slightly improved to 14.5. Meets SIRS Criteria ~Sepsis workup in progress, placed on empiric Cefepime & Vancomycin, tolerating TC trials during day.  Holding Lasix today due to soft BP and worsening Creatinine     Micro Data:  5/27: SARS-CoV-2 and influenza PCR>> negative 5/27: Blood culture x2>> no growth 5/27: Urine>> no growth 5/27: MRSA PCR>> negative 5/28: Sputum>> normal respiratory flora 5/30: Respiratory viral panel>> negative 5/31: SPUTUM CULTURE >> ACINETOBACTER CALCOACETICUS/BAUMANNII COMPLEX  6/13: Tracheal aspirate>>ACINETOBACTER CALCOACETICUS/BAUMANNII COMPLEX 6/24: Blood culture x2>> 6/24: Tracheal aspirate>>       Antimicrobials:  Azithromycin 5/27>>5/29 Ceftriaxone 5/27>>5/29 Vancomycin 5/29>>5/30 Zosyn 5/29>>6/1 Unasyn 6/1>>6/10 Cefepime 6/24>> Vancomycin 6/24>>    Interim History / Subjective:  Remains on vent Morbidly obese       Objective   Blood pressure 105/73, pulse 95, temperature 99.3 F (37.4 C), temperature source Oral, resp. rate 18, height 5' 10" (1.778 m), weight (!) 136.4 kg, SpO2 96 %.    Vent Mode: PRVC FiO2 (%):  [35 %-60 %] 35 % Set Rate:  [15 bmp] 15 bmp Vt Set:  [550 mL] 550 mL PEEP:  [5 cmH20] 5 cmH20 Plateau Pressure:  [19 cmH20-24 cmH20] 24 cmH20   Intake/Output Summary (Last 24 hours) at 03/25/2021 0736 Last data filed at 03/25/2021 0557 Gross per 24 hour  Intake 3111.88  ml  Output 3215 ml  Net -103.12 ml   Filed Weights   03/22/21 0500 03/23/21 0439 03/24/21 0500  Weight: 135.1 kg (!) 136.1 kg (!) 136.4 kg      REVIEW OF SYSTEMS  PATIENT IS UNABLE TO PROVIDE COMPLETE REVIEW OF SYSTEMS DUE TO SEVERE CRITICAL ILLNESS AND TOXIC METABOLIC ENCEPHALOPATHY   PHYSICAL EXAMINATION:  GENERAL:ill appearing HEAD: Normocephalic, atraumatic.  EYES: Pupils equal, round, reactive to light.  No scleral icterus.  MOUTH: Moist mucosal membrane. NECK: Supple. PULMONARY: +rhonchi,  CARDIOVASCULAR: S1 and S2. Regular rate and rhythm. No murmurs, rubs, or gallops.  GASTROINTESTINAL: Soft, nontender, -distended. Positive bowel sounds.  MUSCULOSKELETAL: +edema.  NEUROLOGIC: alert SKIN:intact,warm,dry   Labs/imaging that I havepersonally reviewed  (right click and "Reselect all SmartList Selections" daily)       ASSESSMENT AND PLAN SYNOPSIS    Acute on Chronic Hypoxic and Hypercapnic Respiratory Failure in the setting of ACINOTOBACTER SPECIES PNEUMONIA-completed abx course and AECOPD    Severe ACUTE Hypoxic and Hypercapnic Respiratory Failure -continue Mechanical Ventilator support -continue Bronchodilator Therapy -Wean Fio2 and PEEP as tolerated -VAP/VENT bundle implementation -will perform SAT/SBT when respiratory parameters are met   SEVERE COPD EXACERBATION -continue NEB THERAPY as prescribed -morphine as needed -wean fio2 as needed and tolerated   ACINETOBACTER CALCOACETICUS/BAUMANNII COMPLEX PNEUMONIA>>treated   CARDIAC ICU monitoring   ACUTE KIDNEY INJURY/Renal Failure -continue Foley Catheter-assess need -  Avoid nephrotoxic agents -Follow urine output, BMP -Ensure adequate renal perfusion, optimize oxygenation -Renal dose medications   SEPTIC SHOCK-resolved   INFECTIOUS DISEASE -continue antibiotics as prescribed -follow up cultures -follow up ID consultation  ENDO - ICU hypoglycemic\Hyperglycemia protocol -check  FSBS per protocol   GI GI PROPHYLAXIS as indicated  NUTRITIONAL STATUS DIET-->TF's as tolerated Constipation protocol as indicated   ELECTROLYTES -follow labs as needed -replace as needed -pharmacy consultation and following     Best practice (right click and "Reselect all SmartList Selections" daily)  Diet:  Tube Feed  Pain/Anxiety/Delirium protocol (if indicated): Yes (RASS goal 0 to -1 ) VAP protocol (if indicated): Yes DVT prophylaxis: Lovenox GI prophylaxis: PPI Glucose control:  SSI Yes Central venous access:  No Arterial line:  N/A Foley:  Yes and still needed (frequent incontinence with skin breakdown) Mobility:  OOB as tolerated PT consulted: yes Code Status:  DNR Disposition: ICU     Labs   CBC: Recent Labs  Lab 03/21/21 0444 03/22/21 0424 03/23/21 0613 03/24/21 0322 03/25/21 0532  WBC 9.1 15.5* 14.5* 13.7* 13.3*  NEUTROABS 6.9 13.0* 11.0* 10.5* 9.9*  HGB 10.9* 13.6 12.8* 12.5* 12.1*  HCT 33.9* 40.8 38.6* 37.5* 35.5*  MCV 104.0* 98.3 98.0 98.2 98.6  PLT 147* 207 174 170 117    Basic Metabolic Panel: Recent Labs  Lab 03/21/21 0444 03/21/21 0755 03/22/21 0424 03/23/21 0613 03/24/21 0322 03/25/21 0532  NA 138 145 144 140 138 139  K 3.1* 3.5 3.4* 3.7 3.8 3.9  CL 99 103 99 95* 94* 94*  CO2 33* 35* 34* 34* 35* 36*  GLUCOSE 449* 156* 167* 145* 159* 167*  BUN _0 36* 43* 39*  CREATININE 0.89 0.93 0.80 1.58* 1.31* 0.92  CALCIUM 7.9* 8.3* 9.3 8.6* 8.4* 8.7*  MG 1.8  --  1.9 1.9 2.1 2.2  PHOS 2.3*  --  2.3* 4.0 3.3 2.0*   GFR: Estimated Creatinine Clearance: 117.4 mL/min (by C-G formula based on SCr of 0.92 mg/dL). Recent Labs  Lab 03/22/21 0424 03/23/21 0613 03/23/21 0944 03/24/21 0322 03/25/21 0532  PROCALCITON  --   --  0.48 0.35  --   WBC 15.5* 14.5*  --  13.7* 13.3*    Liver Function Tests: No results for input(s): AST, ALT, ALKPHOS, BILITOT, PROT, ALBUMIN in the last 168 hours. No results for input(s): LIPASE, AMYLASE in  the last 168 hours. No results for input(s): AMMONIA in the last 168 hours.  ABG    Component Value Date/Time   PHART 7.40 03/02/2021 2000   PCO2ART 69 (HH) 03/02/2021 2000   PO2ART 137 (H) 03/02/2021 2000   HCO3 42.7 (H) 03/02/2021 2000   O2SAT 99.1 03/02/2021 2000     Coagulation Profile: No results for input(s): INR, PROTIME in the last 168 hours.  Cardiac Enzymes: No results for input(s): CKTOTAL, CKMB, CKMBINDEX, TROPONINI in the last 168 hours.  HbA1C: Hgb A1c MFr Bld  Date/Time Value Ref Range Status  03/02/2021 04:52 AM 5.9 (H) 4.8 - 5.6 % Final    Comment:    (NOTE)         Prediabetes: 5.7 - 6.4         Diabetes: >6.4         Glycemic control for adults with diabetes: <7.0     CBG: Recent Labs  Lab 03/24/21 1132 03/24/21 1611 03/24/21 1945 03/24/21 2344 03/25/21 0309  GLUCAP 137* 128* 156* 154* 139*    Allergies Allergies  Allergen Reactions  Divalproex Sodium Other (See Comments)    Elevated liver enzymes   Valproic Acid     unknown       DVT/GI PRX  assessed I Assessed the need for Labs I Assessed the need for Foley I Assessed the need for Central Venous Line Family Discussion when available I Assessed the need for Mobilization I made an Assessment of medications to be adjusted accordingly Safety Risk assessment completed  CASE DISCUSSED IN MULTIDISCIPLINARY ROUNDS WITH ICU TEAM     Critical Care Time devoted to patient care services described in this note is 35 minutes.  Critical care was necessary to treat or prevent imminent or life-threatening deterioration.   Corrin Parker, M.D.  [facility]  Medical Director [facility] Cardio-Pulmonary Department

## 2021-03-25 NOTE — Consult Note (Signed)
Pharmacy Antibiotic Note  Eddie Chapman is a 60 y.o. male admitted on 02/23/2021 with pneumonia. His 5/31 sputum culture (tracheal aspirate) found acinetobacter calcoaceticus/baumannii complex. He was treated with 10 days (6/1-6/10) of Unasyn. On 6/13, repeat sputum cultures were sent and found acinetobacter calcoaceticus/baumannii complex. He became febrile and had elevated WBC on 6/23. Repeat sputum was sent again on 6/24.  Pharmacy has been consulted for vancomycin and cefepime dosing.  Plan: Change vancomycin to 1250 mg IV q12h Goal AUC 400-550 Expected AUC: 478 SCr used: 0.97   Height: 5\' 10"  (177.8 cm) Weight: (!) 136.4 kg (300 lb 11.3 oz) IBW/kg (Calculated) : 73  Temp (24hrs), Avg:99.7 F (37.6 C), Min:99.1 F (37.3 C), Max:100.5 F (38.1 C)  Recent Labs  Lab 03/21/21 0444 03/21/21 0755 03/22/21 0424 03/23/21 0613 03/24/21 0322 03/25/21 0532  WBC 9.1  --  15.5* 14.5* 13.7* 13.3*  CREATININE 0.89 0.93 0.80 1.58* 1.31* 0.92     Estimated Creatinine Clearance: 117.4 mL/min (by C-G formula based on SCr of 0.92 mg/dL).    Allergies  Allergen Reactions   Divalproex Sodium Other (See Comments)    Elevated liver enzymes   Valproic Acid     unknown    Antimicrobials this admission: Cefepime 6/24 >> Vancomycin 6/24 >> ------ Amp/sulbactam 6/1 >> 6/10 Zosyn 5/29 >> 5/31 Vancomycin 5/29 >> 5/30 Azithromycin 5/27 >> 5/29 Ceftriaxone 5/27 >> 5/28   Microbiology results: Recent cultures:    6/24 Repeat Sputum (tracheal aspirate): pending    6/24 BCx: NG     6/13 Repeat Sputum (tracheal aspirate): Acinetobacter calcoaceticus/baumannii complex  Previous cultures:    5/31 Sputum (tracheal aspirate): Acinetobacter calcoaceticus/baumannii complex    5/28 Sputum: Normal respiratory flora    5/27 BCx: NG final     5/27 UCx: NG final    5/27 MRSA PCR: Negative  Thank you for allowing pharmacy to be a part of this patient's care.  Tawnya Crook,  PharmD 03/25/2021 7:44 AM

## 2021-03-25 NOTE — Progress Notes (Signed)
PHARMACY CONSULT NOTE  Pharmacy Consult for Electrolyte Monitoring and Replacement   Recent Labs: Potassium (mmol/L)  Date Value  03/25/2021 3.9  05/04/2014 4.3   Magnesium (mg/dL)  Date Value  03/25/2021 2.2   Calcium (mg/dL)  Date Value  03/25/2021 8.7 (L)   Calcium, Total (mg/dL)  Date Value  05/04/2014 9.1   Albumin (g/dL)  Date Value  03/17/2021 3.3 (L)   Phosphorus (mg/dL)  Date Value  03/25/2021 2.0 (L)   Sodium (mmol/L)  Date Value  03/25/2021 139  05/04/2014 140    Assessment: 60 y/o male with a h/o HTN, HLD, OSA, CHF, MDD, GERD, cerebral aneurysm and pulmonary HTN who is admitted with CAP, COPD exacerbation and encephalopathy, s/p tracheostomy and NGT 6/8  Nutrition: vital at 65 mL/hr, PROSource 90 mL QID>TID   Goal of Therapy:  Electrolytes WNL  Plan:  Phos Nak 2 packets x 2 doses Follow electrolytes as ordered by team  Tawnya Crook ,PharmD Clinical Pharmacist 03/25/2021 7:39 AM

## 2021-03-26 DIAGNOSIS — J9612 Chronic respiratory failure with hypercapnia: Secondary | ICD-10-CM

## 2021-03-26 LAB — CULTURE, RESPIRATORY W GRAM STAIN

## 2021-03-26 LAB — BASIC METABOLIC PANEL
Anion gap: 7 (ref 5–15)
BUN: 29 mg/dL — ABNORMAL HIGH (ref 8–23)
CO2: 35 mmol/L — ABNORMAL HIGH (ref 22–32)
Calcium: 8.6 mg/dL — ABNORMAL LOW (ref 8.9–10.3)
Chloride: 96 mmol/L — ABNORMAL LOW (ref 98–111)
Creatinine, Ser: 0.78 mg/dL (ref 0.61–1.24)
GFR, Estimated: >60 mL/min (ref >60)
Glucose, Bld: 155 mg/dL — ABNORMAL HIGH (ref 70–99)
Potassium: 4.2 mmol/L (ref 3.5–5.1)
Sodium: 138 mmol/L (ref 135–145)

## 2021-03-26 LAB — CBC WITH DIFFERENTIAL/PLATELET
Abs Immature Granulocytes: 0.17 10*3/uL — ABNORMAL HIGH (ref 0.00–0.07)
Basophils Absolute: 0 10*3/uL (ref 0.0–0.1)
Basophils Relative: 0 %
Eosinophils Absolute: 0.3 10*3/uL (ref 0.0–0.5)
Eosinophils Relative: 2 %
HCT: 35 % — ABNORMAL LOW (ref 39.0–52.0)
Hemoglobin: 11.5 g/dL — ABNORMAL LOW (ref 13.0–17.0)
Immature Granulocytes: 2 %
Lymphocytes Relative: 8 %
Lymphs Abs: 1 10*3/uL (ref 0.7–4.0)
MCH: 32.6 pg (ref 26.0–34.0)
MCHC: 32.9 g/dL (ref 30.0–36.0)
MCV: 99.2 fL (ref 80.0–100.0)
Monocytes Absolute: 1.5 10*3/uL — ABNORMAL HIGH (ref 0.1–1.0)
Monocytes Relative: 13 %
Neutro Abs: 8.5 10*3/uL — ABNORMAL HIGH (ref 1.7–7.7)
Neutrophils Relative %: 75 %
Platelets: 182 10*3/uL (ref 150–400)
RBC: 3.53 MIL/uL — ABNORMAL LOW (ref 4.22–5.81)
RDW: 13.2 % (ref 11.5–15.5)
WBC: 11.4 10*3/uL — ABNORMAL HIGH (ref 4.0–10.5)
nRBC: 0 % (ref 0.0–0.2)

## 2021-03-26 LAB — GLUCOSE, CAPILLARY
Glucose-Capillary: 101 mg/dL — ABNORMAL HIGH (ref 70–99)
Glucose-Capillary: 111 mg/dL — ABNORMAL HIGH (ref 70–99)
Glucose-Capillary: 117 mg/dL — ABNORMAL HIGH (ref 70–99)
Glucose-Capillary: 125 mg/dL — ABNORMAL HIGH (ref 70–99)
Glucose-Capillary: 148 mg/dL — ABNORMAL HIGH (ref 70–99)
Glucose-Capillary: 175 mg/dL — ABNORMAL HIGH (ref 70–99)

## 2021-03-26 LAB — PHOSPHORUS: Phosphorus: 2.7 mg/dL (ref 2.5–4.6)

## 2021-03-26 LAB — MAGNESIUM: Magnesium: 2 mg/dL (ref 1.7–2.4)

## 2021-03-26 MED ORDER — METOPROLOL TARTRATE 25 MG PO TABS
25.0000 mg | ORAL_TABLET | Freq: Two times a day (BID) | ORAL | Status: DC
  Administered 2021-03-26 – 2021-03-30 (×8): 25 mg via ORAL
  Filled 2021-03-26 (×8): qty 1

## 2021-03-26 MED ORDER — IPRATROPIUM-ALBUTEROL 0.5-2.5 (3) MG/3ML IN SOLN
3.0000 mL | RESPIRATORY_TRACT | Status: DC
  Administered 2021-03-26 – 2021-03-27 (×5): 3 mL via RESPIRATORY_TRACT
  Filled 2021-03-26 (×6): qty 3

## 2021-03-26 MED ORDER — POLYETHYLENE GLYCOL 3350 17 G PO PACK
17.0000 g | PACK | Freq: Every day | ORAL | Status: DC
  Filled 2021-03-26 (×4): qty 1

## 2021-03-26 MED ORDER — FREE WATER
200.0000 mL | Status: DC
  Administered 2021-03-26: 200 mL

## 2021-03-26 MED ORDER — PANTOPRAZOLE SODIUM 40 MG PO PACK
40.0000 mg | PACK | Freq: Every day | ORAL | Status: DC
  Administered 2021-03-27 – 2021-04-03 (×8): 40 mg via ORAL
  Filled 2021-03-26 (×9): qty 20

## 2021-03-26 MED ORDER — ARIPIPRAZOLE 5 MG PO TABS
5.0000 mg | ORAL_TABLET | Freq: Every day | ORAL | Status: DC
  Administered 2021-03-27 – 2021-04-04 (×9): 5 mg via ORAL
  Filled 2021-03-26 (×10): qty 1

## 2021-03-26 MED ORDER — SENNOSIDES-DOCUSATE SODIUM 8.6-50 MG PO TABS
1.0000 | ORAL_TABLET | Freq: Two times a day (BID) | ORAL | Status: DC
  Administered 2021-03-28 – 2021-04-03 (×5): 1 via ORAL
  Filled 2021-03-26 (×10): qty 1

## 2021-03-26 NOTE — Progress Notes (Signed)
PHARMACY CONSULT NOTE  Pharmacy Consult for Electrolyte Monitoring and Replacement   Recent Labs: Potassium (mmol/L)  Date Value  03/26/2021 4.2  05/04/2014 4.3   Magnesium (mg/dL)  Date Value  03/26/2021 2.0   Calcium (mg/dL)  Date Value  03/26/2021 8.6 (L)   Calcium, Total (mg/dL)  Date Value  05/04/2014 9.1   Albumin (g/dL)  Date Value  03/17/2021 3.3 (L)   Phosphorus (mg/dL)  Date Value  03/26/2021 2.7   Sodium (mmol/L)  Date Value  03/26/2021 138  05/04/2014 140    Assessment: 60 y/o male with a h/o HTN, HLD, obesity, OSA, CHF, MDD, GERD, cerebral aneurysm and pulmonary HTN who is admitted with CAP, COPD exacerbation and encephalopathy, s/p tracheostomy and NGT 6/8.   6/27 Scr 0.78 mg/dL. BUN 29 mg/dL. Renal functions are improving. Pt remains on TCT.    Correct Calcium: 8.6 + (0.8 x [4 -3.3] = 9.16 mg/dL   Nutrition:  Vital at 65 mL/hr PROSource 90 mL QID>TID.   Free water 233mL q4h   Goal of Therapy:  Electrolytes WNL   Plan:  No electrolyte replacements today FU electrolytes in the morning   Eddie Chapman

## 2021-03-26 NOTE — Progress Notes (Signed)
Physical Therapy Treatment Patient Details Name: Eddie Chapman MRN: [id number] DOB: 1960/11/15 Today's Date: 03/26/2021    History of Present Illness Eddie Chapman is a 60 y.o. with a complex medical history including systolic CHF chronically, pulmonary hypertension, obstructive sleep apnea, chronic hypoxemia with respiratory failure, obesity hypoventilation syndrome with thoracic restriction, hormonal dysfunction with hypotestosteronism, dyslipidemia, metabolic syndrome, previous subarachnoid hemorrhage, history of traumatic thoracic spinal injury, & depression. He presented to [facility] ED on 02/23/21 with c/o of confusion & SOB for at least 3 days. Pt was admitted to CCU for ARF. he failed extubation and had trach placed on 03/07/21.    PT Comments    Patient alert, speech and family at bedside, PM in place throughout. Seen as a re-evaluation to update goals, pt continued demonstrate progress towards goals and function. The patient was able to perform supine to sit with HOB and minAx2 (for safety). Step by step cueing to maximize technique and independence. Fair sitting balance noted, supervision. Sit <> stand and stand pivot to recliner with minAx2 and handheld assist. Pt up in chair all needs in reach, session shortened due to pt's lunch arrival. The patient would benefit from further skilled PT intervention to continue to progress towards goals. Recommendation remains appropriate.        Follow Up Recommendations  CIR     Equipment Recommendations  Other (comment)    Recommendations for Other Services       Precautions / Restrictions Precautions Precautions: Fall Precaution Comments: trach collar Restrictions Weight Bearing Restrictions: No    Mobility  Bed Mobility Overal bed mobility: Needs Assistance Bed Mobility: Supine to Sit     Supine to sit: Min assist;HOB elevated          Transfers Overall transfer level: Needs assistance Equipment used: 2 person hand held  assist Transfers: Sit to/from [facility] Sit to Stand: Min assist;+2 physical assistance;+2 safety/equipment Stand pivot transfers: Min assist;+2 physical assistance;+2 safety/equipment       General transfer comment: pt able to reach for chair arm as well as clear his feet from the floor this session  Ambulation/Gait                 Stairs             Wheelchair Mobility    Modified Rankin (Stroke Patients Only)       Balance Overall balance assessment: Needs assistance Sitting-balance support: Feet supported;Bilateral upper extremity supported Sitting balance-Leahy Scale: Fair Sitting balance - Comments: Pt able to sit with minA and step by step cueing, supervision to maintain position Postural control: Posterior lean   Standing balance-Leahy Scale: Poor Standing balance comment: reliant on UE support                            Cognition Arousal/Alertness: Awake/alert Behavior During Therapy: WFL for tasks assessed/performed Overall Cognitive Status: Within Functional Limits for tasks assessed Area of Impairment: Safety/judgement                               General Comments: pt oriented but did seem to display some confusion about hospital stay as well as some confusion during conversation.      Exercises      General Comments        Pertinent Vitals/Pain Pain Assessment: No/denies pain    Home Living  Prior Function            PT Goals (current goals can now be found in the care plan section) Acute Rehab PT Goals Patient Stated Goal: to get stronger PT Goal Formulation: With patient Time For Goal Achievement: 04/09/21 Potential to Achieve Goals: Good Progress towards PT goals: Progressing toward goals    Frequency    Min 2X/week      PT Plan Current plan remains appropriate    Co-evaluation              AM-PAC PT "6 Clicks" Mobility   Outcome  Measure  Help needed turning from your back to your side while in a flat bed without using bedrails?: A Lot Help needed moving from lying on your back to sitting on the side of a flat bed without using bedrails?: A Lot Help needed moving to and from a bed to a chair (including a wheelchair)?: A Lot Help needed standing up from a chair using your arms (e.g., wheelchair or bedside chair)?: A Lot Help needed to walk in hospital room?: A Lot Help needed climbing 3-5 steps with a railing? : Total 6 Click Score: 11    End of Session Equipment Utilized During Treatment: Gait belt;Oxygen (trach collar 10L) Activity Tolerance: Patient tolerated treatment well Patient left: in chair;with chair alarm set;with call bell/phone within reach Nurse Communication: Mobility status PT Visit Diagnosis: Muscle weakness (generalized) (M62.81);Difficulty in walking, not elsewhere classified (R26.2);Unsteadiness on feet (R26.81)     Time: 1345-1406 PT Time Calculation (min) (ACUTE ONLY): 21 min  Charges:  $Therapeutic Activity: 8-22 mins                    Lieutenant Diego PT, DPT 2:44 PM,03/26/21

## 2021-03-26 NOTE — Progress Notes (Signed)
Speech Language Pathology Treatment: Dysphagia;Passy Muir Speaking valve  Patient Details Name: Eddie Chapman MRN: [id number] DOB: 1960-11-18 Today's Date: 03/26/2021 Time: 1220-1330 SLP Time Calculation (min) (ACUTE ONLY): 70 min  Assessment / Plan / Recommendation Clinical Impression  Pt seen for both PMV tx session for toleration of PMV wear/use then toleration of po's/oral diet in order to initiate least restrictive diet consistency today. Pt placed on his TC wean this morning per NSG; on wean thus far for ~4 hours and tolerating well. Pt was quite talkative during session; mild Cognitive impairment suspect related to Baseline issues s/p TBI in 2016(per family). He often required verbal cues to follow through w/ general instructions/precautions; min impulsivity w/ decreased insight noted during his communications.  Explained the use and wear of the PMV to pt and GF present; trach and stoma area inspected; ensured Cuff was deflated. RR: 21-25, O2 sats 99%, HR 89-97. W/ cuff deflated at baseline, PMV was placed. Pt was able to immediately and naturally respond to this SLP and verbalize easily indicating his desire for "something to drink". Verbalizations and conversation were c/b adequate in volume and intelligibility w/ fair+ breath support/effort, though gravely vocal quality present. Encouraged pt to focus on slowing down during conversation and use his breath support calmly to support his speech/volume. PMV placement was tolerated for ~45 mins w/out noted O2 desaturation, or change in RR/HR. No overt discomfort noted in his respiratory effort -- pt stated it felt "easy" to wear/talk w/. No effort noted in respirations during the expiratory phase; no overt use of accessary muscles or distress was noted in his breathing pattern. PMV was allowed to remain on as pt proceeded w/ po trials.   Pt appears to adequately tolerate PMV placement w/out overt respiratory discomfort or distress; ANS remained  stable during wear/use. Suspect Shiley trach size, #6, is beneficial for comfortable PMV wear/use.  Much education was given on PMV use/wear, MUST have Cuff deflation for PMV wear, checking and removing the air from the balloon b/f placing, placing/removing the PMV, and care of the PMV. Discussed that is MUST be worn for all eating/drinking. Must NOT be worn when sleeping. Precautions posted at bedside and in chart.  Pt will remain w/ a Cuffed trach d/t need for vent support at night. Sticker placed on Cuff line, and in room. NSG made aware. MD updated. ST services will continue to monitor for any further needs while admitted.     DYSPHAGIA TREATMENT:  Pt seen for assessment of swallowing. He is able to wear the PMV comfortably now for verbal communication and for po intake. See session noted above.   Pt explained general aspiration precautions and agreed verbally to the need for following them especially sitting upright for all oral intake, No Talking w/ food and drink in mouth, and wearing PMV for ALL oral intake. Pt assisted w/ sitting up then given trials of thin liquids, ice chips, purees and minced/broken solids. NO overt clinical s/s of aspiration were noted w/ any consistency; respiratory status remained calm and unlabored, vocal quality clear b/t trials. O2 sats 99%. Pt held Cup when drinking and fed self following instructions for single, small sips slowly given intermittent verbal cues. Straws were utilized for better oral control d/t his positioning and NG tube present. Oral phase appeared min impaired w/ bolus management and timely A-P transfer for swallowing -- increased oral phase time noted w/ swishing and bolus holding at times; min++ chewing of the puree trials. Oral clearing achieved w/  all consistencies given Time. Suspect this presentation is related to his baseline Cognitive decline w/ impact of new illness and extended hospitalization. Pt has natural Dentition for mastication of broken  solids. Discussed the importance of moistening foods and Small bites/sips to pt and GF present.  Recommended remove NGT and initiate diet of Dysphagia level 2 diet (minced foods) w/ gravies added to moisten foods; Thin liquids. Recommend general aspiration precautions; Pills Crushed in Puree d/t Cognitive status; tray setup and positioning assistance Upright for meals. PMV MUST BE PLACED FOR ALL ORAL INTAKE. ST services will continue to f/u w/ pt for toleration of diet and education as needed while admitted. NSG and MD updated on above. Precautions posted at bedside. \       HPI HPI: Pt is a 60 yo M with a complex medical history including hx of cerebral aneurysm, PVD, HFrEF, pulmonary htn, morbid Obesity, OSA, GERD, hx of TBI, w/ recent admit in 09/2020 when he was found Hypoxic and unresponsive. Pt has slower mentation and decision making -- this is Baseline s/p accident when he fell off a roof in 2016 per GF and Father.  Pt was admitted to [facility] ED on 02/23/21 with c/o of confusion & SOB for at least 3 days. Pt was admitted to CCU for ARF. He was orally intubated on 02/28/2021; tracheostomy on 03/07/21 w/ downsize to a #6 shiley XLT on 03/17/2021; trach collar wean initiated/tolerated on 03/20/2021. Current NGT in place for enteral feedings.      SLP Plan  Continue with current plan of care       Recommendations  Diet recommendations: Dysphagia 2 (fine chop);Thin liquid Liquids provided via: Cup;Straw (monitor) Medication Administration: Crushed with puree (for safer swallowing) Supervision: Patient able to self feed;Staff to assist with self feeding;Intermittent supervision to cue for compensatory strategies Compensations: Minimize environmental distractions;Slow rate;Small sips/bites;Lingual sweep for clearance of pocketing;Follow solids with liquid (NO TALKING DURING EATING/DRINKING) Postural Changes and/or Swallow Maneuvers: Out of bed for meals;Seated upright 90 degrees;Upright 30-60 min after  meal (Reflux precautions)      Patient may use Passy-Muir Speech Valve: During all therapies with supervision;During all waking hours (remove during sleep);During PO intake/meals PMSV Supervision: Intermittent MD: Please consider changing trach tube to :  (n/a)         General recommendations:  (PT/OT following) Oral Care Recommendations: Oral care BID;Oral care before and after PO;Staff/trained caregiver to provide oral care (pt to assist) Follow up Recommendations: Inpatient Rehab (TBD) SLP Visit Diagnosis: Dysphagia, unspecified (R13.10);Aphonia (R49.1) (tracheostomy) Plan: Continue with current plan of care       GO                  Orinda Kenner, [place], CCC-SLP Speech Language Pathologist Rehab Services [phone] [facility] 03/26/2021, 5:24 PM

## 2021-03-26 NOTE — Progress Notes (Addendum)
Occupational Therapy Treatment Patient Details Name: Eddie Chapman MRN: [id number] DOB: 09-May-1961 Today's Date: 03/26/2021    History of present illness Eddie Chapman is a 60 y.o. with a complex medical history including systolic CHF chronically, pulmonary hypertension, obstructive sleep apnea, chronic hypoxemia with respiratory failure, obesity hypoventilation syndrome with thoracic restriction, hormonal dysfunction with hypotestosteronism, dyslipidemia, metabolic syndrome, previous subarachnoid hemorrhage, history of traumatic thoracic spinal injury, & depression. He presented to [facility] ED on 02/23/21 with c/o of confusion & SOB for at least 3 days. Pt was admitted to CCU for ARF. he failed extubation and had trach placed on 03/07/21.   OT comments  Pt seen for OT treatment on this date. Upon initial attempt this PM, pt sitting upright in recliner eating lunch. Pt agreeable to working with OT after finishing meal. Upon second attempt ~30 mins later, pt recently returned to bed via mechanical lift with RN, with RN reporting that pt just finished independently feeding himself 90% of meal. Pt with improved verbal communication this date (trach collar + PM in place), and expressing goal of using BSC at next session (nurse secretary notified to order bariatric BSC). Pt somewhat tangential during session, however easily re-directable and motivated to participate in therapy. This date, pt participated in bed-level UE/LE exercises (see below), and was able to independently identify 2 bed-level LE therapy exercises. Pt encouraged to independently perform bed-level exercises at least 2x/day, with pt verbalizing understanding. HR 100s and SpO2 >94% during activity. Pt is making good progress toward goals and continues to benefit from skilled OT services to maximize return to PLOF and minimize risk of future falls, injury, caregiver burden, and readmission. Will continue to follow POC. Discharge recommendation remains  appropriate.     Follow Up Recommendations  CIR    Equipment Recommendations  Other (comment) (defer to next venue)       Precautions / Restrictions Precautions Precautions: Fall Precaution Comments: trach collar Restrictions Weight Bearing Restrictions: No       Mobility Bed Mobility Not assessed this date as pt recently returned to bed with nursing after sitting in chair for 2 hours.         ADL either performed or assessed with clinical judgement   ADL Overall ADL's : Needs assistance/impaired Eating/Feeding: Supervision/ safety;Sitting Eating/Feeding Details (indicate cue type and reason): Pt able to bring cup to mouth without spillage. RN reporting that pt just finished independently feeding himself 90% of meal                                          Cognition Arousal/Alertness: Awake/alert Behavior During Therapy: WFL for tasks assessed/performed Overall Cognitive Status: Impaired/Different from baseline Area of Impairment: Safety/judgement                               General Comments: Pt with improved verbal communication this date (trach collar + PM in place), and expressing goal of using BSC at next session. Pt somewhat tangential regarding previous IV placements, however easily re-directable. Pt was able to independently identify 2 bed-level LE therapy exercises, however continues to demonstrate decreased short term memory as he was unable to recall initial plan to return to bed with OT this PM.        Exercises General Exercises - Upper Extremity Shoulder Flexion: AROM;Strengthening;Both;10 reps;Supine Shoulder Horizontal  ABduction: AROM;Strengthening;Both;10 reps;Supine Elbow Flexion: AROM;Strengthening;Both;10 reps;Supine General Exercises - Lower Extremity Ankle Circles/Pumps: AROM;Strengthening;Both;10 reps;Supine Heel Slides: AAROM;Strengthening;Both;5 reps;Supine Hip ABduction/ADduction: AAROM;Strengthening;Both;10  reps;Supine    Pertinent Vitals/ Pain       Pain Assessment: No/denies pain         Frequency  Min 3X/week        Progress Toward Goals  OT Goals(current goals can now be found in the care plan section)  Progress towards OT goals: Progressing toward goals  Acute Rehab OT Goals Patient Stated Goal: to use [facility] OT Goal Formulation: With patient/family Time For Goal Achievement: 04/04/21 Potential to Achieve Goals: [place] Discharge plan remains appropriate;Frequency remains appropriate       AM-PAC OT "6 Clicks" Daily Activity     Outcome Measure   Help from another person eating meals?: A Little Help from another person taking care of personal grooming?: A Little Help from another person toileting, which includes using toliet, bedpan, or urinal?: Total Help from another person bathing (including washing, rinsing, drying)?: A Lot Help from another person to put on and taking off regular upper body clothing?: A Little Help from another person to put on and taking off regular lower body clothing?: A Lot 6 Click Score: 14    End of Session Equipment Utilized During Treatment: Oxygen (trach collar)  OT Visit Diagnosis: Other abnormalities of gait and mobility (R26.89);Muscle weakness (generalized) (M62.81);Other symptoms and signs involving cognitive function   Activity Tolerance Patient tolerated treatment well   Patient Left in bed;with call bell/phone within reach;with bed alarm set   Nurse Communication Mobility status;Other (comment) (request bariatric BSC)        Time: 6986-1483 OT Time Calculation (min): 24 min  Charges: OT General Charges $OT Visit: 1 Visit OT Treatments $Therapeutic Activity: 23-37 mins  Fredirick Maudlin, OTR/L [place]

## 2021-03-26 NOTE — Plan of Care (Signed)
Pt being off vent and on trach collar all day, Pt will remain w/ a Cuffed trach d/t need for vent support at night. Pt pass speech eval and using PMV as needed. Pt seen for both PMV tx session for toleration of PMV wear/use then toleration of po's/oral diet in order to initiate least restrictive diet consistency today.  Dysphagia level 2 diet (minced foods) w/ gravies added to moisten foods; Thin liquids. Recommend general aspiration precautions; Pills Crushed in Puree d/t Cognitive status; tray setup and positioning assistance Upright for meals. PMV MUST BE PLACED FOR ALL ORAL INTAKE.  Per PT, pt Needs assistance Equipment used: 2 person hand held assist Transfers: Sit to/from [facility] Sit to Stand: Min assist;+2 physical assistance;+2 safety/equipment.  Pt ate 90% of his 1st meal with 360cc ot thin liquid and 75% of his dinner with 360 thin liquid and tolerated well.  Pt was maxi overhead lift to chair and seat for 2hrs, tolerated well.

## 2021-03-26 NOTE — Progress Notes (Signed)
NAME:  Eddie Chapman, MRN:  [id number], DOB:  Sep 28, 1961, LOS: 49 ADMISSION DATE:  02/23/2021  BRIEF SYNOPSIS 60 yo morbidly obese male with acute on chronic hypoxic and hypercapnic respiratory failure due to Humphrey and AECOPD, along with severe toxic metabolic encephalopathy.  Failure to wean from vent requiring Tracheostomy on 03/07/2021.   History of Present Illness:    Patient has a complex comorbid history including systolic CHF chronically, pulmonary hypertension, obstructive sleep apnea, chronic hypoxemia with respiratory failure, obesity hypoventilation syndrome with thoracic restriction, hormonal dysfunction with hypotestosteronism, dyslipidemia, metabolic syndrome, previous subarachnoid hemorrhage, history of traumatic thoracic spinal injury, depression.  Patient was admitted in January to the intensive care unit with altered mental status acute hypoxemic and hypercapnic respiratory failure with metabolic encephalopathy and left lower lobe severe pneumonia.  Throughout hospitalization he was noted to have lethargy with hypercapnic encephalopathy and became unresponsive a few times even postextubation while on medical floor.  Patient has never smoked in the past.   On this admission he is poorly responsive with hypercapnic hypoxemic respiratory failure          Events:      02/24/21- Patient responded well to mechanical ventilation with improvement in hypercapnia on ABG this am.  Seems that he had THC induced apnea/hypopnea based on laboratory testing.  There is bibasilar atelectasis induced severe hypoxemia, recruitment Metaneb therapy has been ordered. Plan to repeat CXR this afternoon with SBT today.  Reviewed plan with RN and RT today.     02/25/21- patient was unable to pass SBT today. He was able to come off propofol with RASS-0 and is weaned down on levophed.     02/26/21- patient continues to require levophed, he has been difficult to wean. I have  asked ID for consultation due to concern for sepsis possibly due to CAP. Unable to perform SBT today. Family at bedside.   02/27/21- patient on PRVC with heavy secretions, difficulty weaning with high narcotic tolerance. Patient had suicide attempt with requirement for psych.   6/1 failing to wean from vent, DNR status 6/2 failed to wean from vent, severe hypoxia 6/3 severe hypoxia, failure to wean from vent, needs [facility], ENT consulted 6/4 failure to wean from  6/6 failure to wean from vent, plan for [facility] 6/7 failure to wean from vent 6/8 plan for [facility] TODAY 6/9 left foot cyanosis, vasc surgery consulted 6/10: Agitation overnight, added oxycodone, valium, seroquel.  SBT in progress.  Working with PT 6/11: Less agitated, however, did not tolerate SBT today 6/12: Dilaudid being weaned off, less agitated.  Can actually speak with trach.  Tolerating SBT 03/12/21- patient febrile, Trache aspirate sent for viral workup. Patient with anasarca on examination. Adequate UOP.  On PRVC 35%, GCS3T. 03/13/21- TCC having difficulty finding CIR placement.  There is RLE swelling and hypothermia and DVT study was ordered, he is with severe anxiety during SBT today and we reviewed MAR with addition of Abilify due to no QTC effect.   03/14/21- patient is awake, he is attempting again for trache collar trial today.  His secretions are improved, he is vocalizing and trying to write for communication. 03/15/21- patient is alert he communicates via gesturing and attempts to vocalize.  Still in withdrawal and on precedex but calm and appropriate.  Plan is to wean precedex and get to PT/OT. 03/16/21- patient is improved mentation, plan for PMV and SLP with ongoing PNMR transfer for recovery.  He will need psych eval at some point  due to suicide attempt 03/17/21- patient is speaking via gesturing and writing.  Trach changed to XLT 6.  Ongoing plan for CIR.   03/18/21- patient continue to improve, he will be able to join PT/OT  with plan to move to [facility] inpatient rehab asap. 03/19/21- SBT vs Trach collar trials during day, rest on vent at night.  Lasix x1 today 03/20/21-Pts current vent settings SBT 15/5 and tolerating well will attempt to trach collar 03/21/21- Tolerating Trach collar trials, work with PT/OT 03/22/21- Continues to tolerate Trach collar trial during day. Speech consulted for PMV. New Leukocytosis (WBC 15.5) and low grade fever.  CXR without new infiltrate. No foley, and denies dysuria.  Plan to remove PICC line. 03/23/21- Persistent Low grade fever (t max 100.6), WBC slightly improved to 14.5. Meets SIRS Criteria ~Sepsis workup in progress, placed on empiric Cefepime & Vancomycin, tolerating TC trials during day.  Holding Lasix today due to soft BP and worsening Creatinine 6/26 started TCT 6/27 remains on TCT     Micro Data:  5/27: SARS-CoV-2 and influenza PCR>> negative 5/27: Blood culture x2>> no growth 5/27: Urine>> no growth 5/27: MRSA PCR>> negative 5/28: Sputum>> normal respiratory flora 5/30: Respiratory viral panel>> negative 5/31: SPUTUM CULTURE >> ACINETOBACTER CALCOACETICUS/BAUMANNII COMPLEX  6/13: Tracheal aspirate>>ACINETOBACTER CALCOACETICUS/BAUMANNII COMPLEX 6/24: Blood culture x2>> 6/24: Tracheal aspirate>>       Antimicrobials:  Azithromycin 5/27>>5/29 Ceftriaxone 5/27>>5/29 Vancomycin 5/29>>5/30 Zosyn 5/29>>6/1 Unasyn 6/1>>6/10 Cefepime 6/24>> Vancomycin 6/24>>    Interim History / Subjective:  On TCT Morbidly obese Alert and awake Restless this AM        Objective   Blood pressure 138/80, pulse (!) 104, temperature 98.8 F (37.1 C), temperature source Oral, resp. rate (!) 21, height 5\' 10"  (1.778 m), weight (!) 141 kg, SpO2 99 %.    Vent Mode: PRVC FiO2 (%):  [35 %-60 %] 60 % Set Rate:  [15 bmp] 15 bmp Vt Set:  [550 mL] 550 mL PEEP:  [5 cmH20] 5 cmH20   Intake/Output Summary (Last 24 hours) at 03/26/2021 0706 Last data filed at 03/26/2021 0600 Gross per  24 hour  Intake 3958.28 ml  Output 2575 ml  Net 1383.28 ml    Filed Weights   03/23/21 0439 03/24/21 0500 03/26/21 0432  Weight: (!) 136.1 kg (!) 136.4 kg (!) 141 kg     Review of Systems: +restless   Physical Examination:   General Appearance: obese, trach in place Neuro:without focal findings,  speech normal,  HEENT: PERRLA, EOM intact.   Pulmonary: normal breath sounds, No wheezing.  CardiovascularNormal S1,S2.  No m/r/g.   Abdomen: Benign, Soft, non-tender. Renal:  No costovertebral tenderness  NEURO INTACT     Labs/imaging that I havepersonally reviewed  (right click and "Reselect all SmartList Selections" daily)       ASSESSMENT AND PLAN SYNOPSIS    Acute on Chronic Hypoxic and Hypercapnic Respiratory Failure in the setting of ACINOTOBACTER SPECIES PNEUMONIA-completed abx course and AECOPD failure to wean from vent severe morbid obesity with COPD and OSA and Cor pulmonale s/p TRACH   Severe ACUTE Hypoxic and Hypercapnic Respiratory Failure TCT as tolerated  SEVERE COPD EXACERBATION -continue NEB THERAPY as prescribed  ACINETOBACTER CALCOACETICUS/BAUMANNII COMPLEX PNEUMONIA>>treated   CARDIAC ICU monitoring  SEPTIC SHOCK-resolved   INFECTIOUS DISEASE -continue antibiotics as prescribed -follow up cultures -follow up ID consultation  GI GI PROPHYLAXIS as indicated  NUTRITIONAL STATUS DIET-->TF's as tolerated Constipation protocol as indicated  ELECTROLYTES -follow labs as needed -replace as needed -  pharmacy consultation and following     Best practice (right click and "Reselect all SmartList Selections" daily)  Diet:  Tube Feed  Pain/Anxiety/Delirium protocol (if indicated): Yes (RASS goal 0 to -1 ) VAP protocol (if indicated): Yes DVT prophylaxis: Lovenox GI prophylaxis: PPI Glucose control:  SSI Yes Central venous access:  No Arterial line:  N/A Foley:  Yes and still needed (frequent incontinence with skin  breakdown) Mobility:  OOB as tolerated PT consulted: yes Code Status:  DNR Disposition: ICU     Labs   CBC: Recent Labs  Lab 03/22/21 0424 03/23/21 0613 03/24/21 0322 03/25/21 0532 03/26/21 0429  WBC 15.5* 14.5* 13.7* 13.3* 11.4*  NEUTROABS 13.0* 11.0* 10.5* 9.9* 8.5*  HGB 13.6 12.8* 12.5* 12.1* 11.5*  HCT 40.8 38.6* 37.5* 35.5* 35.0*  MCV 98.3 98.0 98.2 98.6 99.2  PLT 207 174 170 189 182     Basic Metabolic Panel: Recent Labs  Lab 03/22/21 0424 03/23/21 0613 03/24/21 0322 03/25/21 0532 03/26/21 0429  NA 144 140 138 139 138  K 3.4* 3.7 3.8 3.9 4.2  CL 99 95* 94* 94* 96*  CO2 34* 34* 35* 36* 35*  GLUCOSE 167* 145* 159* 167* 155*  BUN 19 36* 43* 39* 29*  CREATININE 0.80 1.58* 1.31* 0.92 0.78  CALCIUM 9.3 8.6* 8.4* 8.7* 8.6*  MG 1.9 1.9 2.1 2.2 2.0  PHOS 2.3* 4.0 3.3 2.0* 2.7    GFR: Estimated Creatinine Clearance: 137.4 mL/min (by C-G formula based on SCr of 0.78 mg/dL). Recent Labs  Lab 03/23/21 0613 03/23/21 0944 03/24/21 0322 03/25/21 0532 03/26/21 0429  PROCALCITON  --  0.48 0.35 0.18  --   WBC 14.5*  --  13.7* 13.3* 11.4*     Liver Function Tests: No results for input(s): AST, ALT, ALKPHOS, BILITOT, PROT, ALBUMIN in the last 168 hours. No results for input(s): LIPASE, AMYLASE in the last 168 hours. No results for input(s): AMMONIA in the last 168 hours.  ABG    Component Value Date/Time   PHART 7.40 03/02/2021 2000   PCO2ART 69 (HH) 03/02/2021 2000   PO2ART 137 (H) 03/02/2021 2000   HCO3 42.7 (H) 03/02/2021 2000   O2SAT 99.1 03/02/2021 2000      Coagulation Profile: No results for input(s): INR, PROTIME in the last 168 hours.  Cardiac Enzymes: No results for input(s): CKTOTAL, CKMB, CKMBINDEX, TROPONINI in the last 168 hours.  HbA1C: Hgb A1c MFr Bld  Date/Time Value Ref Range Status  03/02/2021 04:52 AM 5.9 (H) 4.8 - 5.6 % Final    Comment:    (NOTE)         Prediabetes: 5.7 - 6.4         Diabetes: >6.4         Glycemic  control for adults with diabetes: <7.0     CBG: Recent Labs  Lab 03/25/21 1130 03/25/21 1607 03/25/21 1939 03/25/21 2346 03/26/21 0342  GLUCAP 109* 110* 128* 132* 148*     Allergies Allergies  Allergen Reactions   Divalproex Sodium Other (See Comments)    Elevated liver enzymes   Valproic Acid     unknown         DVT/GI PRX  assessed I Assessed the need for Labs I Assessed the need for Foley I Assessed the need for Central Venous Line Family Discussion when available I Assessed the need for Mobilization I made an Assessment of medications to be adjusted accordingly Safety Risk assessment completed   Corrin Parker, M.D.  [facility]  Medical Director [place] Director [facility] Cardio-Pulmonary Department

## 2021-03-27 LAB — PHOSPHORUS: Phosphorus: 2.5 mg/dL (ref 2.5–4.6)

## 2021-03-27 LAB — CBC WITH DIFFERENTIAL/PLATELET
Abs Immature Granulocytes: 0.17 10*3/uL — ABNORMAL HIGH (ref 0.00–0.07)
Basophils Absolute: 0.1 10*3/uL (ref 0.0–0.1)
Basophils Relative: 1 %
Eosinophils Absolute: 0.3 10*3/uL (ref 0.0–0.5)
Eosinophils Relative: 2 %
HCT: 35.6 % — ABNORMAL LOW (ref 39.0–52.0)
Hemoglobin: 11.7 g/dL — ABNORMAL LOW (ref 13.0–17.0)
Immature Granulocytes: 2 %
Lymphocytes Relative: 9 %
Lymphs Abs: 1 10*3/uL (ref 0.7–4.0)
MCH: 32.1 pg (ref 26.0–34.0)
MCHC: 32.9 g/dL (ref 30.0–36.0)
MCV: 97.5 fL (ref 80.0–100.0)
Monocytes Absolute: 1.3 10*3/uL — ABNORMAL HIGH (ref 0.1–1.0)
Monocytes Relative: 12 %
Neutro Abs: 8.3 10*3/uL — ABNORMAL HIGH (ref 1.7–7.7)
Neutrophils Relative %: 74 %
Platelets: 176 10*3/uL (ref 150–400)
RBC: 3.65 MIL/uL — ABNORMAL LOW (ref 4.22–5.81)
RDW: 13.1 % (ref 11.5–15.5)
WBC: 11.1 10*3/uL — ABNORMAL HIGH (ref 4.0–10.5)
nRBC: 0 % (ref 0.0–0.2)

## 2021-03-27 LAB — GLUCOSE, CAPILLARY
Glucose-Capillary: 105 mg/dL — ABNORMAL HIGH (ref 70–99)
Glucose-Capillary: 107 mg/dL — ABNORMAL HIGH (ref 70–99)
Glucose-Capillary: 108 mg/dL — ABNORMAL HIGH (ref 70–99)
Glucose-Capillary: 126 mg/dL — ABNORMAL HIGH (ref 70–99)
Glucose-Capillary: 139 mg/dL — ABNORMAL HIGH (ref 70–99)
Glucose-Capillary: 155 mg/dL — ABNORMAL HIGH (ref 70–99)

## 2021-03-27 LAB — BASIC METABOLIC PANEL
Anion gap: 10 (ref 5–15)
BUN: 22 mg/dL (ref 8–23)
CO2: 33 mmol/L — ABNORMAL HIGH (ref 22–32)
Calcium: 9 mg/dL (ref 8.9–10.3)
Chloride: 95 mmol/L — ABNORMAL LOW (ref 98–111)
Creatinine, Ser: 0.79 mg/dL (ref 0.61–1.24)
GFR, Estimated: >60 mL/min (ref >60)
Glucose, Bld: 127 mg/dL — ABNORMAL HIGH (ref 70–99)
Potassium: 4.3 mmol/L (ref 3.5–5.1)
Sodium: 138 mmol/L (ref 135–145)

## 2021-03-27 LAB — MAGNESIUM: Magnesium: 2 mg/dL (ref 1.7–2.4)

## 2021-03-27 MED ORDER — ADULT MULTIVITAMIN W/MINERALS CH
1.0000 | ORAL_TABLET | Freq: Every day | ORAL | Status: DC
  Administered 2021-03-27 – 2021-04-04 (×9): 1 via ORAL
  Filled 2021-03-27 (×9): qty 1

## 2021-03-27 MED ORDER — ENSURE ENLIVE PO LIQD
237.0000 mL | Freq: Three times a day (TID) | ORAL | Status: DC
  Administered 2021-03-27 – 2021-04-04 (×22): 237 mL via ORAL

## 2021-03-27 MED ORDER — IPRATROPIUM-ALBUTEROL 0.5-2.5 (3) MG/3ML IN SOLN
3.0000 mL | Freq: Four times a day (QID) | RESPIRATORY_TRACT | Status: DC
  Administered 2021-03-27 – 2021-03-30 (×13): 3 mL via RESPIRATORY_TRACT
  Filled 2021-03-27 (×12): qty 3

## 2021-03-27 NOTE — Progress Notes (Signed)
PHARMACY CONSULT NOTE  Pharmacy Consult for Electrolyte Monitoring and Replacement   Recent Labs: Potassium (mmol/L)  Date Value  03/27/2021 4.3  05/04/2014 4.3   Magnesium (mg/dL)  Date Value  03/27/2021 2.0   Calcium (mg/dL)  Date Value  03/27/2021 9.0   Calcium, Total (mg/dL)  Date Value  05/04/2014 9.1   Albumin (g/dL)  Date Value  03/17/2021 3.3 (L)   Phosphorus (mg/dL)  Date Value  03/27/2021 2.5   Sodium (mmol/L)  Date Value  03/27/2021 138  05/04/2014 140    Assessment: 60 y/o male with a h/o HTN, HLD, obesity, OSA, CHF, MDD, GERD, cerebral aneurysm and pulmonary HTN who is admitted with CAP, COPD exacerbation and encephalopathy, s/p tracheostomy and NGT 6/8.   Nutrition: dysphagia 2 diet   Goal of Therapy:  Electrolytes WNL   Plan:  No electrolyte replacement today FU electrolytes as needed or ordered by team  Tawnya Crook, PharmD

## 2021-03-27 NOTE — Progress Notes (Signed)
Occupational Therapy Treatment Patient Details Name: Eddie Chapman MRN: [id number] DOB: 1960-10-16 Today's Date: 03/27/2021    History of present illness Eddie Chapman is a 60 y.o. with a complex medical history including systolic CHF chronically, pulmonary hypertension, obstructive sleep apnea, chronic hypoxemia with respiratory failure, obesity hypoventilation syndrome with thoracic restriction, hormonal dysfunction with hypotestosteronism, dyslipidemia, metabolic syndrome, previous subarachnoid hemorrhage, history of traumatic thoracic spinal injury, & depression. He presented to [facility] ED on 02/23/21 with c/o of confusion & SOB for at least 3 days. Pt was admitted to CCU for ARF. he failed extubation and had trach placed on 03/07/21.   OT comments  Pt seen for OT/PT co-treatment on this date to address functional/ADL transfers. At beginning of session, pt voicing frustrations regarding dietary restrictions and diarrhea and initially decling therapy however agreeable following MOD encouragement from therapists and supportive spouse. Pt agreeable to attempt his stated goal of using BSC. Pt currently presents with decreased strength, balance, and activity tolerance, and requires MIN A+2 for sit<>stand transfers from bed, MOD A+2 for sit<>stand transfers from [facility] (d/t low height), MIN GUARD for functional mobility of short household distances (~46ft) with RW, SUPERVISION for seated hand hygiene, and MAX A for posterior peri-care. With activity, HR 100s-120s and SpO2>94%. Pt is making good progress toward goals and continues to benefit from skilled OT services to maximize return to PLOF and minimize risk of future falls, injury, caregiver burden, and readmission. Will continue to follow POC. Discharge recommendation remains appropriate.     Follow Up Recommendations  CIR    Equipment Recommendations  Other (comment) (defer to next venue)       Precautions / Restrictions Precautions Precautions:  Fall Precaution Comments: trach collar Restrictions Weight Bearing Restrictions: No       Mobility Bed Mobility Overal bed mobility: Needs Assistance Bed Mobility: Supine to Sit;Sit to Supine     Supine to sit: Min assist;HOB elevated Sit to supine: Mod assist;+2 for physical assistance   General bed mobility comments: MIN A for trunk elevation during supine>sit. Pt required MOD A for LE management and MOD cues for motor planning sit>supine.    Transfers Overall transfer level: Needs assistance Equipment used: Rolling walker (2 wheeled) Transfers: Sit to/from Stand Sit to Stand: Min assist;Mod assist;+2 physical assistance         General transfer comment: Pt able to rise from standard height bed with only minimal assist and heavy RW use. Increased assist (MOD Ax2) for BSC of low height    Balance Overall balance assessment: Needs assistance Sitting-balance support: Feet supported;Bilateral upper extremity supported Sitting balance-Leahy Scale: Good Sitting balance - Comments: good sitting balance at EOB, requiring MIN GUARD only   Standing balance support: Bilateral upper extremity supported;During functional activity Standing balance-Leahy Scale: Fair Standing balance comment: reliant on UE support                           ADL either performed or assessed with clinical judgement   ADL Overall ADL's : Needs assistance/impaired Eating/Feeding: Supervision/ safety;Sitting Eating/Feeding Details (indicate cue type and reason): Pt able to bring applesauce and ice cream to mouth via spoon without spillage Grooming: Wash/dry hands;Supervision/safety;Set up;Sitting                   Toilet Transfer: Moderate assistance;+2 for physical assistance;Ambulation;BSC;RW;Requires wide/bariatric Toilet Transfer Details (indicate cue type and reason): Increased assistance required for [facility] transfer d/t low height of BSC  Toileting- Clothing Manipulation and  Hygiene: Maximal assistance;Sit to/from stand Toileting - Clothing Manipulation Details (indicate cue type and reason): MAX A fot sit>stand peri-care d/t body habitus     Functional mobility during ADLs: Min guard;Rolling walker (to walk ~25ft)        Cognition Arousal/Alertness: Awake/alert Behavior During Therapy: WFL for tasks assessed/performed Overall Cognitive Status: Impaired/Different from baseline Area of Impairment: Safety/judgement                   Current Attention Level: Focused Memory: Decreased short-term memory;Decreased recall of precautions Following Commands: Follows one step commands consistently Safety/Judgement: Decreased awareness of safety;Decreased awareness of deficits   Problem Solving: Requires verbal cues;Requires tactile cues General Comments: At beginning of session, pt voicing frustrations regarding dietary restrictions and initially decling therapy however agreeable following MOD encouragement from therapists and supportive spouse.              General Comments HR 100s-120s with activity. SpO2>94%    Pertinent Vitals/ Pain       Pain Assessment: No/denies pain   Frequency  Min 3X/week        Progress Toward Goals  OT Goals(current goals can now be found in the care plan section)  Progress towards OT goals: Progressing toward goals  Acute Rehab OT Goals Patient Stated Goal: to use [facility] OT Goal Formulation: With patient/family Time For Goal Achievement: 04/04/21 Potential to Achieve Goals: Good  Plan Discharge plan remains appropriate;Frequency remains appropriate    Co-evaluation    PT/OT/SLP Co-Evaluation/Treatment: Yes Reason for Co-Treatment: Complexity of the patient's impairments (multi-system involvement);For patient/therapist safety;To address functional/ADL transfers PT goals addressed during session: Mobility/safety with mobility;Balance OT goals addressed during session: ADL's and self-care;Strengthening/ROM       AM-PAC OT "6 Clicks" Daily Activity     Outcome Measure   Help from another person eating meals?: A Little Help from another person taking care of personal grooming?: A Little Help from another person toileting, which includes using toliet, bedpan, or urinal?: A Lot Help from another person bathing (including washing, rinsing, drying)?: A Lot Help from another person to put on and taking off regular upper body clothing?: A Little Help from another person to put on and taking off regular lower body clothing?: A Lot 6 Click Score: 15    End of Session Equipment Utilized During Treatment: Gait belt;Rolling walker;Oxygen;Other (comment) (trach collar)  OT Visit Diagnosis: Other abnormalities of gait and mobility (R26.89);Muscle weakness (generalized) (M62.81);Other symptoms and signs involving cognitive function   Activity Tolerance Patient tolerated treatment well   Patient Left in bed;with call bell/phone within reach;with bed alarm set;with family/visitor present   Nurse Communication Mobility status        Time: 1420-1510 OT Time Calculation (min): 50 min  Charges: OT General Charges $OT Visit: 1 Visit OT Treatments $Self Care/Home Management : 23-37 mins  Fredirick Maudlin, OTR/L [place]

## 2021-03-27 NOTE — TOC Progression Note (Signed)
Transition of Care [facility]) - Progression Note    Patient Details  Name: Eddie Chapman MRN: [id number] Date of Birth: Sep 25, 1961  Transition of Care [facility]) CM/SW [place], [place] Phone Number: [phone] 03/27/2021, 3:57 PM  Clinical Narrative:     Patient is improving and has CIR recommendations from PT/OT.  Patient is pending CIR placement at [facility] IP, pending cuffless trach and diet order.       Expected Discharge Plan and Services                                                 Social Determinants of Health (SDOH) Interventions    Readmission Risk Interventions No flowsheet data found.
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
## 2021-03-27 NOTE — Progress Notes (Signed)
Physical Therapy Treatment Patient Details Name: Eddie Chapman MRN: [id number] DOB: [birth year] Today's Date: 03/27/2021    History of Present Illness Eddie Chapman is a 60 y.o. with a complex medical history including systolic CHF chronically, pulmonary hypertension, obstructive sleep apnea, chronic hypoxemia with respiratory failure, obesity hypoventilation syndrome with thoracic restriction, hormonal dysfunction with hypotestosteronism, dyslipidemia, metabolic syndrome, previous subarachnoid hemorrhage, history of traumatic thoracic spinal injury, & depression. He presented to [facility] ED on 02/23/21 with c/o of confusion & SOB for at least 3 days. Pt was admitted to CCU for ARF. he failed extubation and had trach placed on 03/07/21.    PT Comments    Co-treat with OT for safety and physical assist.  He was able to show improved mobility and prolonged standing effort today as compared to prior visits.  He managed 2 forward and backward ambulation bouts of a few feet each with consistent vitals, reasonable safety w/o heavy assist.  He did rise from bed height w/o much assist, needed more from lower BSC but again overall did quite well.  Pt needing some initial encouragement/cuing to participate but did ultimately did well showing good effort once he agreed to participation.    Follow Up Recommendations  CIR     Equipment Recommendations   (TBD at next venue of care)    Recommendations for Other Services       Precautions / Restrictions Precautions Precautions: Fall Precaution Comments: trach collar Restrictions Weight Bearing Restrictions: No    Mobility  Bed Mobility Overal bed mobility: Needs Assistance Bed Mobility: Supine to Sit;Sit to Supine     Supine to sit: Min assist Sit to supine: Mod assist;+2 for physical assistance   General bed mobility comments: Pt did relatively well getting to EOB with only light assist for trunk, needed more assist to get back into bed with LEs and  trunk manangement    Transfers Overall transfer level: Needs assistance Equipment used: Rolling walker (2 wheeled) Transfers: Sit to/from Stand Sit to Stand: Min assist;+2 physical assistance;Mod assist         General transfer comment: Pt able to rise from standard height bed with only minimal assist and heavy RW use.  Lower BSC caused more difficulty with rising to standing and required +2 mod assist.  Ambulation/Gait Ambulation/Gait assistance: Modified independent (Device/Increase time) Gait Distance (Feet): 8 Feet Assistive device: Rolling walker (2 wheeled)       General Gait Details: pt was able to take both lateral steps along EOB as well as a few repeated forward/backward steps.  He needed consistent cuing but no excessive assist with this and showed surprisingly good confidence and safety during actual ambulation.  Good effort, stable vitals and good overall progress.   Stairs             Wheelchair Mobility    Modified Rankin (Stroke Patients Only)       Balance Overall balance assessment: Needs assistance Sitting-balance support: Feet supported;Bilateral upper extremity supported Sitting balance-Leahy Scale: Good Sitting balance - Comments: Pt able to maintain sitting balance w/o close guarding, not overly reliant on UEs     Standing balance-Leahy Scale: Fair Standing balance comment: reliant on UE support, no overt LOBs and good relative confidence                            Cognition Arousal/Alertness: Awake/alert Behavior During Therapy: WFL for tasks assessed/performed Overall Cognitive Status: Impaired/Different from baseline  Area of Impairment: Safety/judgement                                      Exercises Other Exercises Other Exercises: performed marching in place at EOB in walker.  Pt able to maintain this briefly SLS    General Comments        Pertinent Vitals/Pain Pain Assessment:  (pt c/o pain from  IV sticks but no c/o excessive pain)    Home Living                      Prior Function            PT Goals (current goals can now be found in the care plan section) Progress towards PT goals: Progressing toward goals    Frequency    Min 2X/week      PT Plan Current plan remains appropriate    Co-evaluation              AM-PAC PT "6 Clicks" Mobility   Outcome Measure  Help needed turning from your back to your side while in a flat bed without using bedrails?: A Little Help needed moving from lying on your back to sitting on the side of a flat bed without using bedrails?: A Little Help needed moving to and from a bed to a chair (including a wheelchair)?: A Little Help needed standing up from a chair using your arms (e.g., wheelchair or bedside chair)?: A Little Help needed to walk in hospital room?: A Lot Help needed climbing 3-5 steps with a railing? : Total 6 Click Score: 15    End of Session Equipment Utilized During Treatment: Gait belt;Oxygen (trach) Activity Tolerance: Patient tolerated treatment well Patient left: in chair;with bed alarm set;with family/visitor present Nurse Communication: Mobility status PT Visit Diagnosis: Muscle weakness (generalized) (M62.81);Difficulty in walking, not elsewhere classified (R26.2);Unsteadiness on feet (R26.81)     Time: 1434-1510 PT Time Calculation (min) (ACUTE ONLY): 36 min  Charges:  $Therapeutic Activity: 8-22 mins                     Kreg Shropshire, DPT 03/27/2021, 4:16 PM

## 2021-03-27 NOTE — Progress Notes (Signed)
Nutrition Follow-up  DOCUMENTATION CODES:  Morbid obesity  INTERVENTION:  Continue current diet as ordered. Advance as able per SLP. Encourage PO intake Ensure Enlive po TID, each supplement provides 350 kcal and 20 grams of protein Magic cup TID with meals, each supplement provides 290 kcal and 9 grams of protein MVI with minerals daily  NUTRITION DIAGNOSIS:  Increased nutrient needs related to acute illness as evidenced by estimated needs.  GOAL:  Provide needs based on ASPEN/SCCM guidelines  MONITOR:  PO intake, Supplement acceptance, I & O's, Diet advancement, Labs, Weight trends  ASSESSMENT:  60 y/o male with a h/o HTN, HLD, OSA, CHF, MDD, GERD, cerebral aneurysm and pulmonary HTN who is admitted with CAP, COPD exacerbation and encephalopathy.   Pt maintaining respiratory status on trach collar for several days. SLP able to trial PMV and advance diet to DYS 2 6/27. NGT was removed.   Pt resting in bed at the time of visit, alert and awake. Pt reports that he is happy to be able to eat again and is looking forward to improving his health. Discussed the TF he was previously on and the nutrition it was providing. Stressed the importance of eating his meals and that nutrition supplements would aid in meeting his increased needs and preserve lean body mass. Obtained flavor preferences.   Discussed in IDT rounds. Pt making good progress. CCM plans to down-grade from ICU status. Will adjust needs as pt is no longer critically ill.    Intake/Output Summary (Last 24 hours) at 03/27/2021 1105 Last data filed at 03/27/2021 0800 Gross per 24 hour  Intake 626.71 ml  Output 2875 ml  Net -2248.29 ml  Net IO Since Admission: 42,056.74 mL [03/27/21 1105]  Nutritionally Relevant Medications: Scheduled Meds:  insulin aspart  0-15 Units Subcutaneous Q4H   pantoprazole sodium  40 mg Oral Daily   polyethylene glycol  17 g Oral Daily   senna-docusate  1 tablet Oral BID   Continuous  Infusions:  ceFEPime (MAXIPIME) IV 2 g (03/27/21 0520)   PRN Meds: bisacodyl, docusate sodium, glycopyrrolate, polyethylene glycol  Labs Reviewed: SBG ranges from 105-175 mg/dL over the last 24 hours  Diet Order:   Diet Order             DIET DYS 2 Room service appropriate? Yes with Assist; Fluid consistency: Thin  Diet effective now                  EDUCATION NEEDS:  No education needs have been identified at this time  Skin:  Skin Assessment: Reviewed RN Assessment  Last BM:  6/28 - type 6  Height:  Ht Readings from Last 1 Encounters:  03/22/21 5\' 10"  (1.778 m)    Weight:  Wt Readings from Last 1 Encounters:  03/27/21 (!) 137 kg    Ideal Body Weight:  75.5 kg  BMI:  Body mass index is 43.34 kg/m.  Estimated Nutritional Needs:  Kcal:  2400-2600 kcal/d Protein:  120-150g/day Fluid:  2.4-2.6 L/d  Ranell Patrick, RD, LDN Clinical Dietitian Pager on [place]

## 2021-03-27 NOTE — Progress Notes (Signed)
NAME:  Eddie Chapman, MRN:  [id number], DOB:  12/15/1960, LOS: 32 ADMISSION DATE:  02/23/2021  BRIEF SYNOPSIS 60 yo morbidly obese male with acute on chronic hypoxic and hypercapnic respiratory failure due to [place] and AECOPD, along with severe toxic metabolic encephalopathy.  Failure to wean from vent requiring Tracheostomy on 03/07/2021.   History of Present Illness:    Patient has a complex comorbid history including systolic CHF chronically, pulmonary hypertension, obstructive sleep apnea, chronic hypoxemia with respiratory failure, obesity hypoventilation syndrome with thoracic restriction, hormonal dysfunction with hypotestosteronism, dyslipidemia, metabolic syndrome, previous subarachnoid hemorrhage, history of traumatic thoracic spinal injury, depression.  Patient was admitted in January to the intensive care unit with altered mental status acute hypoxemic and hypercapnic respiratory failure with metabolic encephalopathy and left lower lobe severe pneumonia.  Throughout hospitalization he was noted to have lethargy with hypercapnic encephalopathy and became unresponsive a few times even postextubation while on medical floor.  Patient has never smoked in the past.   On this admission he is poorly responsive with hypercapnic hypoxemic respiratory failure          Events:      02/24/21- Patient responded well to mechanical ventilation with improvement in hypercapnia on ABG this am.  Seems that he had THC induced apnea/hypopnea based on laboratory testing.  There is bibasilar atelectasis induced severe hypoxemia, recruitment Metaneb therapy has been ordered. Plan to repeat CXR this afternoon with SBT today.  Reviewed plan with RN and RT today.     02/25/21- patient was unable to pass SBT today. He was able to come off propofol with RASS-0 and is weaned down on levophed.     02/26/21- patient continues to require levophed, he has been difficult to wean. I have  asked ID for consultation due to concern for sepsis possibly due to CAP. Unable to perform SBT today. Family at bedside.   02/27/21- patient on PRVC with heavy secretions, difficulty weaning with high narcotic tolerance. Patient had suicide attempt with requirement for psych.   6/1 failing to wean from vent, DNR status 6/2 failed to wean from vent, severe hypoxia 6/3 severe hypoxia, failure to wean from vent, needs [facility], ENT consulted 6/4 failure to wean from  6/6 failure to wean from vent, plan for [facility] 6/7 failure to wean from vent 6/8 plan for [facility] TODAY 6/9 left foot cyanosis, vasc surgery consulted 6/10: Agitation overnight, added oxycodone, valium, seroquel.  SBT in progress.  Working with PT 6/11: Less agitated, however, did not tolerate SBT today 6/12: Dilaudid being weaned off, less agitated.  Can actually speak with trach.  Tolerating SBT 03/12/21- patient febrile, Trache aspirate sent for viral workup. Patient with anasarca on examination. Adequate UOP.  On PRVC 35%, GCS3T. 03/13/21- TCC having difficulty finding CIR placement.  There is RLE swelling and hypothermia and DVT study was ordered, he is with severe anxiety during SBT today and we reviewed MAR with addition of Abilify due to no QTC effect.   03/14/21- patient is awake, he is attempting again for trache collar trial today.  His secretions are improved, he is vocalizing and trying to write for communication. 03/15/21- patient is alert he communicates via gesturing and attempts to vocalize.  Still in withdrawal and on precedex but calm and appropriate.  Plan is to wean precedex and get to PT/OT. 03/16/21- patient is improved mentation, plan for PMV and SLP with ongoing PNMR transfer for recovery.  He will need psych eval at some point  due to suicide attempt 03/17/21- patient is speaking via gesturing and writing.  Trach changed to XLT 6.  Ongoing plan for CIR.   03/18/21- patient continue to improve, he will be able to join PT/OT  with plan to move to [facility] inpatient rehab asap. 03/19/21- SBT vs Trach collar trials during day, rest on vent at night.  Lasix x1 today 03/20/21-Pts current vent settings SBT 15/5 and tolerating well will attempt to trach collar 03/21/21- Tolerating Trach collar trials, work with PT/OT 03/22/21- Continues to tolerate Trach collar trial during day. Speech consulted for PMV. New Leukocytosis (WBC 15.5) and low grade fever.  CXR without new infiltrate. No foley, and denies dysuria.  Plan to remove PICC line. 03/23/21- Persistent Low grade fever (t max 100.6), WBC slightly improved to 14.5. Meets SIRS Criteria ~Sepsis workup in progress, placed on empiric Cefepime & Vancomycin, tolerating TC trials during day.  Holding Lasix today due to soft BP and worsening Creatinine 6/26 started TCT 6/27 remains on TCT 6/28 remains on TCT, toerating oral feeds     Micro Data:  5/27: SARS-CoV-2 and influenza PCR>> negative 5/27: Blood culture x2>> no growth 5/27: Urine>> no growth 5/27: MRSA PCR>> negative 5/28: Sputum>> normal respiratory flora 5/30: Respiratory viral panel>> negative 5/31: SPUTUM CULTURE >> ACINETOBACTER CALCOACETICUS/BAUMANNII COMPLEX  6/13: Tracheal aspirate>>ACINETOBACTER CALCOACETICUS/BAUMANNII COMPLEX 6/24: Blood culture x2>> 6/24: Tracheal aspirate>>       Antimicrobials:  Azithromycin 5/27>>5/29 Ceftriaxone 5/27>>5/29 Vancomycin 5/29>>5/30 Zosyn 5/29>>6/1 Unasyn 6/1>>6/10 Cefepime 6/24>> Vancomycin 6/24>>    Interim History / Subjective:  Alert and awake TCT Eating Moving all ext, follows commands         Objective   Blood pressure 126/76, pulse (!) 108, temperature 98.7 F (37.1 C), temperature source Oral, resp. rate (!) 27, height 5\' 10"  (1.778 m), weight (!) 137 kg, SpO2 99 %.    FiO2 (%):  [60 %] 60 %   Intake/Output Summary (Last 24 hours) at 03/27/2021 0736 Last data filed at 03/27/2021 0600 Gross per 24 hour  Intake 1011.66 ml  Output 3025 ml  Net  -2013.34 ml    Filed Weights   03/24/21 0500 03/26/21 0432 03/27/21 0433  Weight: (!) 136.4 kg (!) 141 kg (!) 137 kg     Review of Systems:  Gen:  Denies  fever, sweats, chills weight loss  HEENT: Denies blurred vision, double vision, ear pain, eye pain, hearing loss, nose bleeds, sore throat Cardiac:  No dizziness, chest pain or heaviness, chest tightness,edema, No JVD Resp:   No cough, -sputum production, -shortness of breath,-wheezing, -hemoptysis,  Other:  All other systems negative   Physical Examination:   General Appearance: No distress  Neuro:without focal findings,  speech normal,  HEENT: PERRLA, EOM intact.  S/p trach Pulmonary: normal breath sounds, No wheezing.  CardiovascularNormal S1,S2.  No m/r/g.    ALL OTHER ROS ARE NEGATIVE      Labs/imaging that I havepersonally reviewed  (right click and "Reselect all SmartList Selections" daily)       ASSESSMENT AND PLAN SYNOPSIS    Acute on Chronic Hypoxic and Hypercapnic Respiratory Failure in the setting of ACINOTOBACTER SPECIES PNEUMONIA-completed abx course and AECOPD failure to wean from vent severe morbid obesity with COPD and OSA and Cor pulmonale s/p [facility]  Doing well with TCT Oxygen as needed PT/OT SPeech Therapy following   ACINETOBACTER CALCOACETICUS/BAUMANNII COMPLEX PNEUMONIA>>treated   CARDIAC ICU monitoring  SEPTIC SHOCK-resolved   INFECTIOUS DISEASE -continue antibiotics as prescribed -follow up cultures -follow up  ID consultation  ELECTROLYTES -follow labs as needed -replace as needed -pharmacy consultation and following     Best practice (right click and "Reselect all SmartList Selections" daily)  Diet:  Tube Feed  Pain/Anxiety/Delirium protocol (if indicated): Yes (RASS goal 0 to -1 ) VAP protocol (if indicated): Yes DVT prophylaxis: Lovenox GI prophylaxis: PPI Glucose control:  SSI Yes Central venous access:  No Arterial line:  N/A Foley:  Yes and still needed  (frequent incontinence with skin breakdown) Mobility:  OOB as tolerated PT consulted: yes Code Status:  DNR Disposition: SD     Labs   CBC: Recent Labs  Lab 03/23/21 0613 03/24/21 0322 03/25/21 0532 03/26/21 0429 03/27/21 0602  WBC 14.5* 13.7* 13.3* 11.4* 11.1*  NEUTROABS 11.0* 10.5* 9.9* 8.5* 8.3*  HGB 12.8* 12.5* 12.1* 11.5* 11.7*  HCT 38.6* 37.5* 35.5* 35.0* 35.6*  MCV 98.0 98.2 98.6 99.2 97.5  PLT 174 170 189 182 176     Basic Metabolic Panel: Recent Labs  Lab 03/23/21 0613 03/24/21 0322 03/25/21 0532 03/26/21 0429 03/27/21 0602  NA 140 138 139 138 138  K 3.7 3.8 3.9 4.2 4.3  CL 95* 94* 94* 96* 95*  CO2 34* 35* 36* 35* 33*  GLUCOSE 145* 159* 167* 155* 127*  BUN 36* 43* 39* 29* 22  CREATININE 1.58* 1.31* 0.92 0.78 0.79  CALCIUM 8.6* 8.4* 8.7* 8.6* 9.0  MG 1.9 2.1 2.2 2.0 2.0  PHOS 4.0 3.3 2.0* 2.7 2.5    GFR: Estimated Creatinine Clearance: 135.2 mL/min (by C-G formula based on SCr of 0.79 mg/dL). Recent Labs  Lab 03/23/21 0944 03/24/21 0322 03/25/21 0532 03/26/21 0429 03/27/21 0602  PROCALCITON 0.48 0.35 0.18  --   --   WBC  --  13.7* 13.3* 11.4* 11.1*     Liver Function Tests: No results for input(s): AST, ALT, ALKPHOS, BILITOT, PROT, ALBUMIN in the last 168 hours. No results for input(s): LIPASE, AMYLASE in the last 168 hours. No results for input(s): AMMONIA in the last 168 hours.  ABG    Component Value Date/Time   PHART 7.40 03/02/2021 2000   PCO2ART 69 (HH) 03/02/2021 2000   PO2ART 137 (H) 03/02/2021 2000   HCO3 42.7 (H) 03/02/2021 2000   O2SAT 99.1 03/02/2021 2000      Coagulation Profile: No results for input(s): INR, PROTIME in the last 168 hours.  Cardiac Enzymes: No results for input(s): CKTOTAL, CKMB, CKMBINDEX, TROPONINI in the last 168 hours.  HbA1C: Hgb A1c MFr Bld  Date/Time Value Ref Range Status  03/02/2021 04:52 AM 5.9 (H) 4.8 - 5.6 % Final    Comment:    (NOTE)         Prediabetes: 5.7 - 6.4          Diabetes: >6.4         Glycemic control for adults with diabetes: <7.0     CBG: Recent Labs  Lab 03/26/21 1614 03/26/21 1916 03/26/21 2330 03/27/21 0423 03/27/21 0726  GLUCAP 175* 101* 111* 105* 107*     Allergies Allergies  Allergen Reactions   Divalproex Sodium Other (See Comments)    Elevated liver enzymes   Valproic Acid     unknown         DVT/GI PRX  assessed I Assessed the need for Labs I Assessed the need for Foley I Assessed the need for Central Venous Line Family Discussion when available I Assessed the need for Mobilization I made an Assessment of medications to be adjusted accordingly  Safety Risk assessment completed  SD status Consider transferring to [place], M.D.  [facility] Pulmonary & Critical Care Medicine  Medical Director [place] Director [facility] Cardio-Pulmonary Department

## 2021-03-27 NOTE — TOC Progression Note (Signed)
Transition of Care [facility]) - Progression Note    Patient Details  Name: Eddie Chapman MRN: [id number] Date of Birth: Feb 19, 1961  Transition of Care [facility]) CM/SW [place], [place] Phone Number: [phone] 03/27/2021, 3:55 PM  Clinical Narrative:     CSW sent insurance appeal paperwork to [place].       Expected Discharge Plan and Services                                                 Social Determinants of Health (SDOH) Interventions    Readmission Risk Interventions No flowsheet data found.

## 2021-03-28 DIAGNOSIS — D72828 Other elevated white blood cell count: Secondary | ICD-10-CM | POA: Diagnosis not present

## 2021-03-28 DIAGNOSIS — J9602 Acute respiratory failure with hypercapnia: Secondary | ICD-10-CM | POA: Diagnosis not present

## 2021-03-28 DIAGNOSIS — E662 Morbid (severe) obesity with alveolar hypoventilation: Secondary | ICD-10-CM | POA: Diagnosis not present

## 2021-03-28 DIAGNOSIS — J9601 Acute respiratory failure with hypoxia: Secondary | ICD-10-CM | POA: Diagnosis not present

## 2021-03-28 LAB — CBC WITH DIFFERENTIAL/PLATELET
Abs Immature Granulocytes: 0.25 10*3/uL — ABNORMAL HIGH (ref 0.00–0.07)
Basophils Absolute: 0.1 10*3/uL (ref 0.0–0.1)
Basophils Relative: 1 %
Eosinophils Absolute: 0.3 10*3/uL (ref 0.0–0.5)
Eosinophils Relative: 2 %
HCT: 36 % — ABNORMAL LOW (ref 39.0–52.0)
Hemoglobin: 12 g/dL — ABNORMAL LOW (ref 13.0–17.0)
Immature Granulocytes: 2 %
Lymphocytes Relative: 11 %
Lymphs Abs: 1.2 10*3/uL (ref 0.7–4.0)
MCH: 32.3 pg (ref 26.0–34.0)
MCHC: 33.3 g/dL (ref 30.0–36.0)
MCV: 96.8 fL (ref 80.0–100.0)
Monocytes Absolute: 1.2 10*3/uL — ABNORMAL HIGH (ref 0.1–1.0)
Monocytes Relative: 11 %
Neutro Abs: 8 10*3/uL — ABNORMAL HIGH (ref 1.7–7.7)
Neutrophils Relative %: 73 %
Platelets: 190 10*3/uL (ref 150–400)
RBC: 3.72 MIL/uL — ABNORMAL LOW (ref 4.22–5.81)
RDW: 13.1 % (ref 11.5–15.5)
WBC: 10.9 10*3/uL — ABNORMAL HIGH (ref 4.0–10.5)
nRBC: 0 % (ref 0.0–0.2)

## 2021-03-28 LAB — CULTURE, BLOOD (ROUTINE X 2)
Culture: NO GROWTH
Culture: NO GROWTH
Special Requests: ADEQUATE

## 2021-03-28 LAB — GLUCOSE, CAPILLARY
Glucose-Capillary: 110 mg/dL — ABNORMAL HIGH (ref 70–99)
Glucose-Capillary: 111 mg/dL — ABNORMAL HIGH (ref 70–99)
Glucose-Capillary: 156 mg/dL — ABNORMAL HIGH (ref 70–99)
Glucose-Capillary: 127 mg/dL — ABNORMAL HIGH (ref 70–99)
Glucose-Capillary: 162 mg/dL — ABNORMAL HIGH (ref 70–99)

## 2021-03-28 LAB — BASIC METABOLIC PANEL
Anion gap: 7 (ref 5–15)
BUN: 20 mg/dL (ref 8–23)
CO2: 33 mmol/L — ABNORMAL HIGH (ref 22–32)
Calcium: 9.2 mg/dL (ref 8.9–10.3)
Chloride: 99 mmol/L (ref 98–111)
Creatinine, Ser: 0.67 mg/dL (ref 0.61–1.24)
GFR, Estimated: >60 mL/min (ref >60)
Glucose, Bld: 131 mg/dL — ABNORMAL HIGH (ref 70–99)
Potassium: 4.4 mmol/L (ref 3.5–5.1)
Sodium: 139 mmol/L (ref 135–145)

## 2021-03-28 LAB — MAGNESIUM: Magnesium: 1.9 mg/dL (ref 1.7–2.4)

## 2021-03-28 LAB — PHOSPHORUS: Phosphorus: 2.8 mg/dL (ref 2.5–4.6)

## 2021-03-28 NOTE — Progress Notes (Signed)
Physical Therapy Treatment Patient Details Name: Eddie Chapman MRN: [id number] DOB: [birth year] Today's Date: 03/28/2021    History of Present Illness Eddie Chapman is a 60 y.o. with a complex medical history including systolic CHF chronically, pulmonary hypertension, obstructive sleep apnea, chronic hypoxemia with respiratory failure, obesity hypoventilation syndrome with thoracic restriction, hormonal dysfunction with hypotestosteronism, dyslipidemia, metabolic syndrome, previous subarachnoid hemorrhage, history of traumatic thoracic spinal injury, & depression. He presented to [facility] ED on 02/23/21 with c/o of confusion & SOB for at least 3 days. Pt was admitted to CCU for ARF. he failed extubation and had trach placed on 03/07/21.    PT Comments    Pt keeping eyes closed initially and voicing unwillingness to work with PT.  PT had spoken with nursing before-hand about trailing more "prolonged" ambulation and using portable O2 to do so.  Pt, however, was not at all willing to do this and even with a lot of encouragement and then offer to just stay close to the bed he was adamant that his diarrhea issues were too much and he simply was not going to get up today.  He did finally agree to doing some supine exercises acknowledging that he is weak and needs to at least do something but he was so rambling and tangential t/o the session that even these were limited compared to what he is truly capable of.  Overall pt was able to show only inconsistent effort and needed plenty of cuing/encouragement to participate this date.  Follow Up Recommendations  CIR     Equipment Recommendations       Recommendations for Other Services       Precautions / Restrictions Precautions Precautions: Fall Restrictions Weight Bearing Restrictions: No    Mobility  Bed Mobility               General bed mobility comments: Pt defers getting up today citing continued diarrhea - fear for more and fatigue for  cleaning up earlier    Transfers                    Ambulation/Gait                 Stairs             Wheelchair Mobility    Modified Rankin (Stroke Patients Only)       Balance                                            Cognition Arousal/Alertness: Awake/alert Behavior During Therapy: WFL for tasks assessed/performed Overall Cognitive Status: Impaired/Different from baseline Area of Impairment: Safety/judgement                               General Comments: Pt with varying tangential and distracted verbalizations t/o the session.  Difficult to convince to really try a lot today - not agitated, but generally disgruntled      Exercises General Exercises - Lower Extremity Ankle Circles/Pumps: AROM;Strengthening;Both;10 reps;Supine Short Arc Quad: AAROM;10 reps (pt struggled to consistently string reps together 2/2 rambling conversation) Heel Slides: AAROM;Strengthening;Both;Supine;10 reps (lightly resisted leg ext, when pt paying enough attention to do so) Hip ABduction/ADduction: AAROM;Strengthening;Both;10 reps;Supine    General Comments General comments (skin integrity, edema, etc.): Typical elevated HR in the 110-120s range  much of the time, however during conversation (with some frustration/agitation) HR had brief spikes in the 140s and even high 150s at times      Pertinent Vitals/Pain Pain Assessment: No/denies pain    Home Living                      Prior Function            PT Goals (current goals can now be found in the care plan section) Progress towards PT goals: Progressing toward goals    Frequency    Min 2X/week      PT Plan Current plan remains appropriate    Co-evaluation              AM-PAC PT "6 Clicks" Mobility   Outcome Measure  Help needed turning from your back to your side while in a flat bed without using bedrails?: A Little Help needed moving from lying  on your back to sitting on the side of a flat bed without using bedrails?: A Little Help needed moving to and from a bed to a chair (including a wheelchair)?: A Little Help needed standing up from a chair using your arms (e.g., wheelchair or bedside chair)?: A Little Help needed to walk in hospital room?: A Lot Help needed climbing 3-5 steps with a railing? : Total 6 Click Score: 15    End of Session Equipment Utilized During Treatment: Oxygen Activity Tolerance: Patient limited by fatigue Patient left: with bed alarm set;with call bell/phone within reach Nurse Communication: Mobility status (unwillingness to attempt standing/walking) PT Visit Diagnosis: Muscle weakness (generalized) (M62.81);Difficulty in walking, not elsewhere classified (R26.2);Unsteadiness on feet (R26.81)     Time: 2595-6387 PT Time Calculation (min) (ACUTE ONLY): 27 min  Charges:  $Therapeutic Exercise: 23-37 mins                     Kreg Shropshire, DPT 03/28/2021, 1:20 PM

## 2021-03-28 NOTE — Care Plan (Signed)
Mr. Marvin has continued to consistently tolerate Trach collar (hasn't required Ventilator support since 03/22/21).  He is afebrile, hemodynamically stable, no pressors.  He is working with PT/OT, and Speech therapy with PMV.  Critical care needs have resolved.    Discussed with Dr. Mortimer Fries.  PCCM will sign off at this time.  Please re-consult if any critical care needs arise or if we can be of further assistance.        Darel Hong, AGACNP-BC [place] [facility] Prefer epic messenger for cross cover needs If after hours, please call E-link

## 2021-03-28 NOTE — Progress Notes (Addendum)
[place] at [place] NAME: Eddie Chapman    MR#:  [id number]  DATE OF BIRTH:  1961-01-05  SUBJECTIVE:   patient picked up on [facility] service. Came in with acute hypoxic respiratory failure underwent intubation failed four week trial and now tracheostomy placed. Currently on trach collar sats stable able to communicate. Tolerating PO diet.  REVIEW OF SYSTEMS:   Review of Systems  Constitutional:  Negative for chills, fever and weight loss.  HENT:  Negative for ear discharge, ear pain and nosebleeds.   Eyes:  Negative for blurred vision, pain and discharge.  Respiratory:  Negative for sputum production, shortness of breath, wheezing and stridor.   Cardiovascular:  Negative for chest pain, palpitations, orthopnea and PND.  Gastrointestinal:  Negative for abdominal pain, diarrhea, nausea and vomiting.  Genitourinary:  Negative for frequency and urgency.  Musculoskeletal:  Negative for back pain and joint pain.  Neurological:  Positive for weakness. Negative for sensory change, speech change and focal weakness.  Psychiatric/Behavioral:  Negative for depression and hallucinations. The patient is not nervous/anxious.   Tolerating Diet: Tolerating PT:   DRUG ALLERGIES:   Allergies  Allergen Reactions  . Divalproex Sodium Other (See Comments)    Elevated liver enzymes  . Valproic Acid     unknown    VITALS:  Blood pressure (!) 143/81, pulse (!) 110, temperature 98.6 F (37 C), temperature source Oral, resp. rate (!) 28, height 5\' 10"  (1.778 m), weight (!) 138.7 kg, SpO2 96 %.  PHYSICAL EXAMINATION:   Physical Exam  GENERAL:  60 y.o.-year-old patient lying in the bed with no acute distress. Chronically ill appearing HEENT: Head atraumatic, normocephalic. Oropharynx and nasopharynx clear.  Trach collar+ LUNGS: Normal breath sounds bilaterally, no wheezing, rales, rhonchi. No use of accessory muscles of respiration.  CARDIOVASCULAR: S1, S2  normal. No murmurs, rubs, or gallops.  ABDOMEN: Soft, nontender, nondistended. Bowel sounds present. No organomegaly or mass.  EXTREMITIES: No cyanosis, clubbing or edema b/l.    NEUROLOGIC: grossly nonfocal PSYCHIATRIC:  patient is alert and oriented x 3.  SKIN: No obvious rash, lesion, or ulcer.   LABORATORY PANEL:  CBC Recent Labs  Lab 03/28/21 0412  WBC 10.9*  HGB 12.0*  HCT 36.0*  PLT 190    Chemistries  Recent Labs  Lab 03/28/21 0412  NA 139  K 4.4  CL 99  CO2 33*  GLUCOSE 131*  BUN 20  CREATININE 0.67  CALCIUM 9.2  MG 1.9   Cardiac Enzymes No results for input(s): TROPONINI in the last 168 hours. RADIOLOGY:  No results found. ASSESSMENT AND PLAN:  60 yo morbidly obese male with acute on chronic hypoxic and hypercapnic respiratory failurehistory including systolic CHF chronically, pulmonary hypertension, obstructive sleep apnea, chronic hypoxemia with respiratory failure, obesity hypoventilation syndrome with thoracic restriction, hormonal dysfunction with hypotestosteronism, dyslipidemia, metabolic syndrome, previous subarachnoid hemorrhage, history of traumatic thoracic spinal injury, depression.   Acute on Chronic Hypoxic and Hypercapnic Respiratory Failure in the setting of ACINOTOBACTER SPECIES PNEUMONIA -S/P tracheostomy on 03/07/21 -Wean FiO2 and PEEP as tolerated to maintain O2 sats 88 to 92% -Spontaneous breathing trial/trach collar trials as tolerated -Speech consulted for evaluation of PMV -Bronchodilators and budesonide nebs              ACINETOBACTER CALCOACETICUS/BAUMANNII COMPLEX PNEUMONIA Meets SIRS Criteria (Leukocytosis w/ WBC 14.5, HR  118, RR 26) -s/p course of Unasyn -Repeat CXR on 03/22/21 without evidence of pneumonia -PICC line removed 03/22/21 --  pt now on IV cefepime for 7 days (STARTED 6/24)   Septic Shock>>resolved Hypertension -Maintain MAP >65 -Continue clonidine patch and Metoprolol    Acute toxic metabolic  encephalopathy>>resolved -Avoid sedating medications as able -Continue Abilify -PRN Klonopin, Oxycodone -Provide supportive care, prn trazodone -PT/OT consult   D/c foley  Procedures:Tracheostomy Family communication : CODE STATUS: DNR DVT Prophylaxis :enoxaparin Level of care: Stepdown Status is: Inpatient  Remains inpatient appropriate because:Inpatient level of care appropriate due to severity of illness  Dispo: The patient is from: Home              Anticipated d/c is to:  TBD              Patient currently is not medically stable to d/c.   Difficult to place patient No        TOTAL TIME TAKING CARE OF THIS PATIENT: 30 minutes.  >50% time spent on counselling and coordination of care  Note: This dictation was prepared with Dragon dictation along with smaller phrase technology. Any transcriptional errors that result from this process are unintentional.  Fritzi Mandes M.D    [facility]   CC: Primary care physician; Adin Hector, MD [name], male   DOB: 15-May-1961, 60 y.o.   MRN: [id number]

## 2021-03-28 NOTE — Progress Notes (Signed)
Speech Language Pathology Treatment: Dysphagia;Passy Muir Speaking valve  Patient Details Name: Eddie Chapman MRN: [id number] DOB: Feb 14, 1961 Today's Date: 03/28/2021 Time: 3614-4315 SLP Time Calculation (min) (ACUTE ONLY): 55 min  Assessment / Plan / Recommendation Clinical Impression  Pt seen for both PMV tx session for toleration of PMV wear/use then toleration of po's/oral diet -- oral diet (dysphagia 1 w/ thin liquids) modified yesterday. Pt continues on TC wean today per RT. Pt was alert, talkative during session; mild Cognitive impairment, suspect related to Baseline issues s/p TBI in 2016(per family). He often required verbal cues to follow through w/ general instructions/precautions; min impulsivity w/ decreased insight of his medical situation noted during his conversations.   Explained the use and wear of the PMV to pt; trach and stoma area inspected; ensured Cuff was deflated placing the PMV. Pt's respiratory effort appeared min increased w/ any exertion including Talking and Moving to sit upright in bed. RR: 19-25, O2 sats 96-99%, HR 111-117. FiO2 28% at 5L per chart. Cuff deflated at baseline. Pt verbalized indicating his desire for "chocolate milk and cranberry drink". Verbalizations and conversation were c/b adequate in volume and intelligibility w/ fair breath support/effort, though gravely, min strained vocal quality present. Encouraged pt to focus on slowing down during conversation and use his breath support calmly to support his speech/volume. PMV placement was tolerated during sessions w/out noted O2 desaturation, or significant change in RR/HR from his baseline. No overt discomfort noted in his respiratory effort -- pt stated it felt "fine" to wear/talk w/. No increased effort noted in respirations during the expiratory phase; no overt use of accessary muscles or distress was noted in his breathing pattern. PMV was allowed to remain on as pt consumed po's.   Pt appears to  adequately tolerate PMV placement w/out overt, gross respiratory discomfort or distress; ANS remained adequately stable at his Baseline during wear/use. Suspect Shiley trach size, #6, is beneficial for comfortable PMV wear/use.  Much education was given on PMV use/wear, MUST have Cuff deflation for PMV wear, checking and removing the air from the balloon b/f placing, placing/removing the PMV, and care of the PMV. Discussed that is MUST be worn for all eating/drinking; and can be work w/ Therapies(OT, PT). Must NOT be worn when sleeping. Encouraged Rest Breaks at times during the day. Precautions posted at bedside and in chart.  Pt remains w/ a Cuffed trach during his wean at this time. Sticker placed on Cuff line, and in room. NSG made aware. MD updated. ST services will continue to monitor for any further needs while admitted.     DYSPHAGIA TREATMENT:  Pt seen for ongoing assessment of swallowing. He is able to wear the PMV comfortably now for verbal communication and for po intake. See session noted above.  NSG reported no noted coughing during oral intake last evening; pt agreed. Diet was modified to a Puree diet w/ thin liquids yesterday by SLP. Pt explained general aspiration precautions and agreed verbally to the need for following them especially sitting upright for all oral intake, No Talking w/ food and drink in mouth, and wearing PMV for ALL oral intake. Pt assisted w/ sitting up and using multiple pillows behind him for a forward position. Noted min increased RR rate and effort d/t the moving in bed. After Rest Break, he was then given trials of thin liquids, ice chips, and purees. NO overt clinical s/s of aspiration were noted w/ any consistency; No coughing, respiratory status remained relatively calm and  overtly unlabored w/ Rest Breaks(as at his Baseline), vocal quality clear b/t trials. O2 sats 97%; HR 117. Pt held Cup when drinking and fed self following instructions for single, small sips  slowly given intermittent verbal cues. Straws were utilized for better oral control d/t his positioning. Oral phase appeared [facility] and improved for bolus management and timely A-P transfer for swallowing -- more Timely oral phase time noted this session today w/ the Pureed consistency foods than w/ minced consistency foods. No swishing and bolus holding seen as at previous session.  Oral clearing achieved w/ all consistencies given Time. Suspect min impact of his baseline Cognitive decline w/ impact of new illness and extended hospitalization. Pt has natural Dentition for mastication of broken solids.    Discussed pt's presentation w/ NSG and RT; noted Baseline of GERD/REFLUX w/ phlegm and expectoration episodes at home. Pt is on a PPI currently.   Recommended continuing a Dysphagia level 1 diet (puree foods) w/ gravies added to moisten foods; Thin liquids. Recommend strict aspiration precautions; Pills Crushed in Puree d/t Cognitive status and for ease of swallowing; tray setup and positioning assistance Upright for meals. PMV MUST BE PLACED FOR ALL ORAL INTAKE. Monitoring w/ all oral intake. ST services will continue to f/u w/ pt for toleration of diet, objective swallow assessment(MBSS) if indicated, and education on precautions while admitted. NSG and MD updated on above. Precautions posted at bedside for PMV wear/use and aspiration precautions.       HPI HPI: Pt is a 60 yo M with a complex medical history including hx of cerebral aneurysm, PVD, HFrEF, pulmonary htn, morbid Obesity, OSA, GERD, hx of TBI, w/ recent admit in 09/2020 when he was found Hypoxic and unresponsive. Pt has slower mentation and decision making -- this is Baseline s/p accident when he fell off a roof in 2016 per GF and Father.  Pt was admitted to [facility] ED on 02/23/21 with c/o of confusion & SOB for at least 3 days. Pt was admitted to CCU for ARF. He was orally intubated on 02/28/2021; tracheostomy on 03/07/21 w/ downsize to a #6 shiley  XLT on 03/17/2021; trach collar wean initiated/tolerated on 03/20/2021. Current NGT in place for enteral feedings.      SLP Plan  Continue with current plan of care       Recommendations  Diet recommendations: Dysphagia 1 (puree);Thin liquid Liquids provided via: Cup;Straw (monitor) Medication Administration: Crushed with puree (for safer swallowing at this time) Supervision: Patient able to self feed;Staff to assist with self feeding;Intermittent supervision to cue for compensatory strategies Compensations: Minimize environmental distractions;Slow rate;Small sips/bites;Lingual sweep for clearance of pocketing;Follow solids with liquid Postural Changes and/or Swallow Maneuvers: Out of bed for meals;Seated upright 90 degrees;Upright 30-60 min after meal (REFLUX precautions)      Patient may use Passy-Muir Speech Valve: During all therapies with supervision;During all waking hours (remove during sleep);During PO intake/meals PMSV Supervision: Intermittent         General recommendations:  (Pt/OT following) Oral Care Recommendations: Oral care BID;Oral care before and after PO;Staff/trained caregiver to provide oral care (pt to assist) Follow up Recommendations: Inpatient Rehab (TBD) SLP Visit Diagnosis: Dysphagia, unspecified (R13.10);[place]ph[place] (R49.1) (tracheostomy; baseline TBI w/ Cognitive impact 2016) Plan: Continue with current plan of care       [place], [place], [place] Pathologist Rehab Services [phone] [facility] 03/28/2021, 12:31 PM

## 2021-03-29 DIAGNOSIS — E662 Morbid (severe) obesity with alveolar hypoventilation: Secondary | ICD-10-CM | POA: Diagnosis not present

## 2021-03-29 DIAGNOSIS — D72828 Other elevated white blood cell count: Secondary | ICD-10-CM | POA: Diagnosis not present

## 2021-03-29 DIAGNOSIS — J9602 Acute respiratory failure with hypercapnia: Secondary | ICD-10-CM | POA: Diagnosis not present

## 2021-03-29 DIAGNOSIS — J9601 Acute respiratory failure with hypoxia: Secondary | ICD-10-CM | POA: Diagnosis not present

## 2021-03-29 LAB — GLUCOSE, CAPILLARY
Glucose-Capillary: 121 mg/dL — ABNORMAL HIGH (ref 70–99)
Glucose-Capillary: 121 mg/dL — ABNORMAL HIGH (ref 70–99)
Glucose-Capillary: 133 mg/dL — ABNORMAL HIGH (ref 70–99)
Glucose-Capillary: 143 mg/dL — ABNORMAL HIGH (ref 70–99)
Glucose-Capillary: 157 mg/dL — ABNORMAL HIGH (ref 70–99)
Glucose-Capillary: 172 mg/dL — ABNORMAL HIGH (ref 70–99)
Glucose-Capillary: 174 mg/dL — ABNORMAL HIGH (ref 70–99)

## 2021-03-29 NOTE — Progress Notes (Signed)
[place] at [place] NAME: Eddie Chapman    MR#:  [id number]  DATE OF BIRTH:  01-21-1961  SUBJECTIVE:   Came in with acute hypoxic respiratory failure underwent intubation failed four week trial and now tracheostomy placed. Currently on trach collar sats stable able to communicate. Tolerating PO diet.  GF at bedside. Asking me when he can go back home  REVIEW OF SYSTEMS:   Review of Systems  Constitutional:  Negative for chills, fever and weight loss.  HENT:  Negative for ear discharge, ear pain and nosebleeds.   Eyes:  Negative for blurred vision, pain and discharge.  Respiratory:  Negative for sputum production, shortness of breath, wheezing and stridor.   Cardiovascular:  Negative for chest pain, palpitations, orthopnea and PND.  Gastrointestinal:  Negative for abdominal pain, diarrhea, nausea and vomiting.  Genitourinary:  Negative for frequency and urgency.  Musculoskeletal:  Negative for back pain and joint pain.  Neurological:  Positive for weakness. Negative for sensory change, speech change and focal weakness.  Psychiatric/Behavioral:  Negative for depression and hallucinations. The patient is not nervous/anxious.   Tolerating Diet:yes Tolerating PT: CIR  DRUG ALLERGIES:   Allergies  Allergen Reactions   Divalproex Sodium Other (See Comments)    Elevated liver enzymes   Valproic Acid     unknown    VITALS:  Blood pressure 117/78, pulse (!) 110, temperature 98.1 F (36.7 C), temperature source Oral, resp. rate (!) 35, height 5\' 10"  (1.778 m), weight (!) 141.2 kg, SpO2 97 %.  PHYSICAL EXAMINATION:   Physical Exam  GENERAL:  60 y.o.-year-old patient lying in the bed with no acute distress. Chronically ill appearing HEENT: Head atraumatic, normocephalic. Oropharynx and nasopharynx clear.  Trach collar+ LUNGS: Normal breath sounds bilaterally, no wheezing, rales, rhonchi. No use of accessory muscles of respiration.   CARDIOVASCULAR: S1, S2 normal. No murmurs, rubs, or gallops.  ABDOMEN: Soft, nontender, nondistended. Bowel sounds present. No organomegaly or mass.  EXTREMITIES: No cyanosis, clubbing or edema b/l.    NEUROLOGIC: grossly nonfocal PSYCHIATRIC:  patient is alert and oriented x 3.  SKIN: No obvious rash, lesion, or ulcer.   LABORATORY PANEL:  CBC Recent Labs  Lab 03/28/21 0412  WBC 10.9*  HGB 12.0*  HCT 36.0*  PLT 190     Chemistries  Recent Labs  Lab 03/28/21 0412  NA 139  K 4.4  CL 99  CO2 33*  GLUCOSE 131*  BUN 20  CREATININE 0.67  CALCIUM 9.2  MG 1.9    Cardiac Enzymes No results for input(s): TROPONINI in the last 168 hours. RADIOLOGY:  No results found. ASSESSMENT AND PLAN:  60 yo morbidly obese male with acute on chronic hypoxic and hypercapnic respiratory failurehistory including systolic CHF chronically, pulmonary hypertension, obstructive sleep apnea, chronic hypoxemia with respiratory failure, obesity hypoventilation syndrome with thoracic restriction, hormonal dysfunction with hypotestosteronism, dyslipidemia, metabolic syndrome, previous subarachnoid hemorrhage, history of traumatic thoracic spinal injury, depression.   Acute on Chronic Hypoxic and Hypercapnic Respiratory Failure in the setting of Eddie Chapman -S/P tracheostomy on 03/07/21 -Wean FiO2 and PEEP as tolerated to maintain O2 sats 88 to 92% -Spontaneous breathing trial/trach collar trials as tolerated -Speech consulted for evaluation of PMV -Rest on Ventilator during night -Follow intermittent CXR and ABG as needed -Bronchodilators and budesonide nebs              ACINETOBACTER CALCOACETICUS/BAUMANNII COMPLEX PNEUMONIA Meets SIRS Criteria (Leukocytosis w/ WBC 14.5, HR  118, RR 26) -Monitor fever curve -s/p course of Unasyn -Repeat CXR on 03/22/21 without evidence of pneumonia -PICC line removed 03/22/21 --pt now on IV cefepime for 7 days (STARTED 6/24)   Septic  Shock>>resolved Hypertension -Continuous cardiac monitoring -Maintain MAP >65 -Continue clonidine patch and Metoprolol   Hypernatremia>>resolved Mild Hypokalemia>>resolved -Monitor I&O's / urinary output -Avoid nephrotoxic agents as able -Replace electrolytes as indicated    Acute toxic metabolic encephalopathy>>resolved -Avoid sedating medications as able -Continue Abilify -PRN Klonopin, Oxycodone -Provide supportive care, prn trazodone -PT/OT consult    Procedures:Tracheostomy Family communication :GF at bedside CODE STATUS: DNR DVT Prophylaxis :enoxaparin Level of care: Med-Surg Status is: Inpatient  Remains inpatient appropriate because:Inpatient level of care appropriate due to severity of illness  Dispo: The patient is from: Home              Anticipated d/c is to:  TBD              Patient currently is not medically stable to d/c.   Difficult to place patient No   Transfer to [place] THIS PATIENT: 25 minutes.  >50% time spent on counselling and coordination of care  Note: This dictation was prepared with Dragon dictation along with smaller phrase technology. Any transcriptional errors that result from this process are unintentional.  Eddie Chapman M.D    [facility]   CC: Primary care physician; Eddie Hector, MD [name], male   DOB: [birth year], 60 y.o.   MRN: [id number]

## 2021-03-29 NOTE — Progress Notes (Signed)
Physical Therapy Treatment Patient Details Name: Eddie Chapman MRN: [id number] DOB: January 24, 1961 Today's Date: 03/29/2021    History of Present Illness Eddie Chapman is a 60 y.o. with a complex medical history including systolic CHF chronically, pulmonary hypertension, obstructive sleep apnea, chronic hypoxemia with respiratory failure, obesity hypoventilation syndrome with thoracic restriction, hormonal dysfunction with hypotestosteronism, dyslipidemia, metabolic syndrome, previous subarachnoid hemorrhage, history of traumatic thoracic spinal injury, & depression. He presented to [facility] ED on 02/23/21 with c/o of confusion & SOB for at least 3 days. Pt was admitted to CCU for ARF. he failed extubation and had trach placed on 03/07/21.    PT Comments    Patient needs encouragement to participate. Cues for attention to task as patient is easily distracted with tangential speech. Patient was able to stand x 2 bouts and take several steps along edge of bed with rolling walker. Patient fatigued with activity with increased respiration rate with exertion. Assistance required for all activity. Recommend to continue PT to maximize independence and facilitate return to prior level of function.    Follow Up Recommendations  CIR     Equipment Recommendations  None recommended by PT    Recommendations for Other Services       Precautions / Restrictions Precautions Precautions: Fall Restrictions Weight Bearing Restrictions: No    Mobility  Bed Mobility Overal bed mobility: Needs Assistance Bed Mobility: Supine to Sit;Sit to Supine     Supine to sit: Mod assist;+2 for physical assistance     General bed mobility comments: verbal cues for task initiation and sequencing. +2 person assistance required    Transfers Overall transfer level: Needs assistance Equipment used: Rolling walker (2 wheeled) Transfers: Sit to/from Stand Sit to Stand: Min assist;+2 physical assistance         General  transfer comment: verbal cues for technique, hand placement, sequencing. lifting assistance required with cues for increased eccentric control. 2 bouts of standing performed  Ambulation/Gait Ambulation/Gait assistance: Min assist;+2 physical assistance Gait Distance (Feet): 2 Feet Assistive device: Rolling walker (2 wheeled) (bariatirc rolling walker)   Gait velocity: decreased   General Gait Details: patient able to take several side steps along edge of bed x 2 bouts. verbal cues for technique. steadying assistance provided and intermittent assistance for rolling walker negotiation. patient fatigued with activity with increased respiration rate with mobility   Stairs             Wheelchair Mobility    Modified Rankin (Stroke Patients Only)       Balance                                            Cognition Arousal/Alertness: Awake/alert Behavior During Therapy: WFL for tasks assessed/performed Overall Cognitive Status: Impaired/Different from baseline                                 General Comments: patient with tangential speech throughout. distracted and needs cues for attention to task. encouragement needed to participate      Exercises      General Comments        Pertinent Vitals/Pain Pain Assessment: No/denies pain    Home Living                      Prior Function  PT Goals (current goals can now be found in the care plan section) Acute Rehab PT Goals Patient Stated Goal: to get some rest PT Goal Formulation: With patient Time For Goal Achievement: 04/09/21 Potential to Achieve Goals: Good Progress towards PT goals: Progressing toward goals    Frequency    Min 2X/week      PT Plan Current plan remains appropriate    Co-evaluation PT/OT/SLP Co-Evaluation/Treatment: Yes Reason for Co-Treatment: Complexity of the patient's impairments (multi-system involvement) PT goals addressed  during session: Mobility/safety with mobility        AM-PAC PT "6 Clicks" Mobility   Outcome Measure  Help needed turning from your back to your side while in a flat bed without using bedrails?: A Little Help needed moving from lying on your back to sitting on the side of a flat bed without using bedrails?: A Lot Help needed moving to and from a bed to a chair (including a wheelchair)?: A Lot Help needed standing up from a chair using your arms (e.g., wheelchair or bedside chair)?: A Lot Help needed to walk in hospital room?: Total Help needed climbing 3-5 steps with a railing? : Total 6 Click Score: 11    End of Session Equipment Utilized During Treatment: Oxygen   Patient left: in bed;with call bell/phone within reach Nurse Communication: Mobility status PT Visit Diagnosis: Muscle weakness (generalized) (M62.81);Difficulty in walking, not elsewhere classified (R26.2);Unsteadiness on feet (R26.81)     Time: 1735-6701 PT Time Calculation (min) (ACUTE ONLY): 35 min  Charges:  $Therapeutic Activity: 8-22 mins                     Minna Merritts, PT, MPT    Percell Locus 03/29/2021, 12:51 PM

## 2021-03-29 NOTE — Progress Notes (Signed)
Tolerated trache collar on 5L O2 all day, no SOB or desaturation noted. PMV also tolerated all day. Able to consume 100% of all three meals, slowly but safely, without signs of respiratory distress. Foley catheter removed, able to urinate using urinal. Was able to stand at bedside with walker and assist x2. Remains tachypneic and tachycardic per baseline. Open wound with hard, purplish margin noted, cleansed and dressed with foam. Wound consult ordered for review. No other issues noted.

## 2021-03-29 NOTE — Progress Notes (Signed)
Speech Language Pathology Treatment: Dysphagia (education)  Patient Details Name: Eddie Chapman MRN: [id number] DOB: 1961-05-20 Today's Date: 03/29/2021 Time: 4268-3419 SLP Time Calculation (min) (ACUTE ONLY): 35 min  Assessment / Plan / Recommendation Clinical Impression  Pt seen for ongoing assessment of swallowing; education on general aspiration precautions w/ oral intake and his diet consistency. He is able to wear the PMV comfortably now for verbal communication and for po intake. NSG reported no noted coughing during oral intake at meals, Pills crushed in puree; pt agreed. Diet has been modified to a Puree diet w/ thin liquids by SLP for safer oral intake at this time.   Discussed pt's diet consistency, general aspiration precautions, and wear of the PMV during all oral intake w/ both pt and GF this session. Discussed pt's progress and need for the diet consistency at this time as pt continues to improve both medically and from a Pulmonary standpoint. Explained general aspiration precautions and their importance including small, single sips and small bites of foods -- not overfilling the mouth or eating too fast. Pt agreed verbally and was able to recall 4/5 precautions verbally w/ min verbal cues. Especially highlighted the importance of sitting upright and Forward for all oral intake, No Talking w/ food and drink in mouth, and wearing PMV for ALL oral intake. Pt again repeated he needed to "wear this speaking valve when I eat and drink". Suspect inconsistency in recall, follow through w/ precautions, and reduced insight could be min impacts of his baseline Cognitive decline (old TBI) w/ impact of new illness and extended hospitalization. Discussed the above w/ GF present who agreed.   Discussed pt's presentation w/ NSG; noted Baseline of GERD/REFLUX w/ phlegm and expectoration episodes at home and need to follow REFLUX precautions currently. Pt is on a PPI currently.   Recommended NOT making  any changes in the diet consistency currently d/t apparent Good Toleration -- recommend continuing a Dysphagia level 1 diet (puree foods) w/ gravies added to moisten foods; Thin liquids. Recommend strict aspiration precautions; Pills Crushed in Puree d/t Cognitive status and for ease of swallowing; tray setup and positioning assistance Upright for meals. PMV MUST BE PLACED FOR ALL ORAL INTAKE. Monitoring w/ all oral intake. ST services will continue to f/u w/ pt for toleration of diet, objective swallow assessment(MBSS) if indicated, and education on precautions while admitted. NSG and MD updated on above. Precautions posted at bedside for PMV wear/use and aspiration precautions.      HPI HPI: Pt is a 60 yo M with a complex medical history including hx of cerebral aneurysm, PVD, HFrEF, pulmonary htn, morbid Obesity, OSA, GERD, hx of TBI, w/ recent admit in 09/2020 when he was found Hypoxic and unresponsive. Pt has slower mentation and decision making -- this is Baseline s/p accident when he fell off a roof in 2016 per GF and Father.  Pt was admitted to [facility] ED on 02/23/21 with c/o of confusion & SOB for at least 3 days. Pt was admitted to CCU for ARF. He was orally intubated on 02/28/2021; tracheostomy on 03/07/21 w/ downsize to a #6 shiley XLT on 03/17/2021; trach collar wean initiated/tolerated on 03/20/2021. Current NGT in place for enteral feedings.      SLP Plan  Continue with current plan of care       Recommendations  Diet recommendations: Dysphagia 1 (puree);Thin liquid Liquids provided via: Cup;Straw (monitor straw use) Medication Administration: Crushed with puree (for ease, safer swallow) Supervision: Patient able to self  feed;Staff to assist with self feeding;Intermittent supervision to cue for compensatory strategies Compensations: Minimize environmental distractions;Slow rate;Small sips/bites;Lingual sweep for clearance of pocketing;Follow solids with liquid (PMV placed w/ oral  intake) Postural Changes and/or Swallow Maneuvers: Out of bed for meals;Seated upright 90 degrees;Upright 30-60 min after meal (REFLUX precautions)      Patient may use Passy-Muir Speech Valve: During all therapies with supervision;During all waking hours (remove during sleep);During PO intake/meals PMSV Supervision: Intermittent MD: Please consider changing trach tube to : Other (comment);Cuffless (possible capping trials in future)         General recommendations:  (PT/OT following) Oral Care Recommendations: Oral care BID;Oral care before and after PO;Staff/trained caregiver to provide oral care (pt to assist) Follow up Recommendations: Inpatient Rehab (TBD) SLP Visit Diagnosis: Dysphagia, unspecified (R13.10);[place] (R49.1) (tracheostomy; baseline TBI w/ Cognitive impact 2016) Plan: Continue with current plan of care       [facility], [place], [place] Pathologist Rehab Services [phone] [facility] 03/29/2021, 3:46 PM

## 2021-03-29 NOTE — Progress Notes (Signed)
   03/29/21 1400  Clinical Encounter Type  Visited With Patient and family together  Visit Type Spiritual support;Psychological support  Referral From Chaplain  Consult/Referral To [place] visited PT, with wife at bedside. PT and wife were incredibly grateful for the visit.  PT spoke of getting adjusted to communicating with a Trache in his neck. His wife spoke of her deep faith and belief in God. Chaplain ministered with presence and prayer, as requested.

## 2021-03-29 NOTE — Progress Notes (Addendum)
Occupational Therapy Treatment Patient Details Name: Eddie Chapman MRN: [id number] DOB: 11/11/1960 Today's Date: 03/29/2021    History of present illness Eddie Chapman is a 60 y.o. with a complex medical history including systolic CHF chronically, pulmonary hypertension, obstructive sleep apnea, chronic hypoxemia with respiratory failure, obesity hypoventilation syndrome with thoracic restriction, hormonal dysfunction with hypotestosteronism, dyslipidemia, metabolic syndrome, previous subarachnoid hemorrhage, history of traumatic thoracic spinal injury, & depression. He presented to [facility] ED on 02/23/21 with c/o of confusion & SOB for at least 3 days. Pt was admitted to CCU for ARF. he failed extubation and had trach placed on 03/07/21.   OT comments  Eddie Chapman was seen for OT/PT co-treatment on this date. Upon arrival to room pt reclined in bed, RN removing foley. Pt requires encouragement, agreeable to participation. Pt requires  MAX A don B socks at bed level. MIN A x2 for ADL t/f. SBA seated grooming tasks, pt tolerates ~15 min sitting EOB. Pt making good progress toward goals. Pt continues to benefit from skilled OT services to maximize return to PLOF and minimize risk of future falls, injury, caregiver burden, and readmission. Will continue to follow POC. Discharge recommendation remains appropriate.    Follow Up Recommendations  CIR    Equipment Recommendations  Other (comment) (to be determined at next venue of care)    Recommendations for Other Services      Precautions / Restrictions Precautions Precautions: Fall Restrictions Weight Bearing Restrictions: No       Mobility Bed Mobility Overal bed mobility: Needs Assistance Bed Mobility: Supine to Sit;Sit to Supine     Supine to sit: Mod assist;+2 for physical assistance     General bed mobility comments: verbal cues for task initiation and sequencing. +2 person assistance required    Transfers Overall transfer level: Needs  assistance Equipment used: Rolling walker (2 wheeled) Transfers: Sit to/from Stand Sit to Stand: Min assist;+2 physical assistance         General transfer comment: verbal cues for technique, hand placement, sequencing. lifting assistance required with cues for increased eccentric control. 2 bouts of standing performed    Balance Overall balance assessment: Needs assistance Sitting-balance support: Feet supported;Bilateral upper extremity supported Sitting balance-Leahy Scale: Good     Standing balance support: Bilateral upper extremity supported;During functional activity Standing balance-Leahy Scale: Fair Standing balance comment: reliant on UE support                           ADL either performed or assessed with clinical judgement   ADL Overall ADL's : Needs assistance/impaired                                       General ADL Comments: MAX A don B socks at bed level. MIN A x2 for ADL t/f. SBA seated grooming tasks, pt tolerates ~15 min sitting EOB       Cognition Arousal/Alertness: Awake/alert Behavior During Therapy: WFL for tasks assessed/performed Overall Cognitive Status: Impaired/Different from baseline                                 General Comments: patient with tangential speech throughout. distracted and needs cues for attention to task. encouragement needed to participate        Exercises Exercises: Other exercises Other Exercises  Other Exercises: Pt educated re: OT role, DME recs, d/c recs, falls prevention, ECS Other Exercises: LBD, sup<>sit, sitting/standing balance/tolerance, side steps, grooming           Pertinent Vitals/ Pain       Pain Assessment: No/denies pain   Frequency  Min 3X/week        Progress Toward Goals  OT Goals(current goals can now be found in the care plan section)  Progress towards OT goals: Progressing toward goals  Acute Rehab OT Goals Patient Stated Goal: to get  some rest OT Goal Formulation: With patient/family Time For Goal Achievement: 04/04/21 Potential to Achieve Goals: Good ADL Goals Pt Will Perform Grooming: with min assist;with mod assist;sitting Pt Will Transfer to Toilet: stand pivot transfer;with mod assist;bedside commode Pt Will Perform Toileting - Clothing Manipulation and hygiene: with adaptive equipment;sit to/from stand;with mod assist  Plan Discharge plan remains appropriate;Frequency remains appropriate    Co-evaluation      Reason for Co-Treatment: Complexity of the patient's impairments (multi-system involvement) PT goals addressed during session: Mobility/safety with mobility        AM-PAC OT "6 Clicks" Daily Activity     Outcome Measure   Help from another person eating meals?: A Little Help from another person taking care of personal grooming?: A Little Help from another person toileting, which includes using toliet, bedpan, or urinal?: A Lot Help from another person bathing (including washing, rinsing, drying)?: A Lot Help from another person to put on and taking off regular upper body clothing?: A Little Help from another person to put on and taking off regular lower body clothing?: A Lot 6 Click Score: 15    End of Session Equipment Utilized During Treatment: Gait belt;Rolling walker;Oxygen  OT Visit Diagnosis: Other abnormalities of gait and mobility (R26.89);Muscle weakness (generalized) (M62.81);Other symptoms and signs involving cognitive function   Activity Tolerance Patient tolerated treatment well   Patient Left in bed;with call bell/phone within reach;with bed alarm set   Nurse Communication          Time: 0177-9390 OT Time Calculation (min): 33 min  Charges: OT General Charges $OT Visit: 1 Visit OT Treatments $Self Care/Home Management : 8-22 mins  Dessie Coma, M.S. OTR/L  03/29/21, 1:28 PM  ascom [phone]

## 2021-03-30 DIAGNOSIS — D72828 Other elevated white blood cell count: Secondary | ICD-10-CM | POA: Diagnosis not present

## 2021-03-30 DIAGNOSIS — E662 Morbid (severe) obesity with alveolar hypoventilation: Secondary | ICD-10-CM | POA: Diagnosis not present

## 2021-03-30 DIAGNOSIS — J9601 Acute respiratory failure with hypoxia: Secondary | ICD-10-CM | POA: Diagnosis not present

## 2021-03-30 DIAGNOSIS — J9602 Acute respiratory failure with hypercapnia: Secondary | ICD-10-CM | POA: Diagnosis not present

## 2021-03-30 LAB — GLUCOSE, CAPILLARY
Glucose-Capillary: 118 mg/dL — ABNORMAL HIGH (ref 70–99)
Glucose-Capillary: 129 mg/dL — ABNORMAL HIGH (ref 70–99)
Glucose-Capillary: 168 mg/dL — ABNORMAL HIGH (ref 70–99)
Glucose-Capillary: 187 mg/dL — ABNORMAL HIGH (ref 70–99)
Glucose-Capillary: 142 mg/dL — ABNORMAL HIGH (ref 70–99)

## 2021-03-30 MED ORDER — METOPROLOL TARTRATE 25 MG PO TABS
37.5000 mg | ORAL_TABLET | Freq: Two times a day (BID) | ORAL | Status: DC
  Administered 2021-03-30 – 2021-04-02 (×6): 37.5 mg via ORAL
  Filled 2021-03-30 (×6): qty 2

## 2021-03-30 MED ORDER — IPRATROPIUM-ALBUTEROL 0.5-2.5 (3) MG/3ML IN SOLN
3.0000 mL | Freq: Three times a day (TID) | RESPIRATORY_TRACT | Status: DC
  Administered 2021-03-30 – 2021-04-05 (×18): 3 mL via RESPIRATORY_TRACT
  Filled 2021-03-30 (×16): qty 3

## 2021-03-30 NOTE — Progress Notes (Signed)
[facility] [place] NAME: Eddie Chapman    MR#:  [id number]  DATE OF BIRTH:  [birth year]  SUBJECTIVE:   Came in with acute hypoxic respiratory failure underwent intubation failed four week trial and now tracheostomy placed. Currently on trach collar sats stable able to communicate. Tolerating PO diet.   REVIEW OF SYSTEMS:   Review of Systems  Constitutional:  Negative for chills, fever and weight loss.  HENT:  Negative for ear discharge, ear pain and nosebleeds.   Eyes:  Negative for blurred vision, pain and discharge.  Respiratory:  Negative for sputum production, shortness of breath, wheezing and stridor.   Cardiovascular:  Negative for chest pain, palpitations, orthopnea and PND.  Gastrointestinal:  Negative for abdominal pain, diarrhea, nausea and vomiting.  Genitourinary:  Negative for frequency and urgency.  Musculoskeletal:  Negative for back pain and joint pain.  Neurological:  Positive for weakness. Negative for sensory change, speech change and focal weakness.  Psychiatric/Behavioral:  Negative for depression and hallucinations. The patient is not nervous/anxious.   Tolerating Diet:yes Tolerating PT: CIR  DRUG ALLERGIES:   Allergies  Allergen Reactions   Divalproex Sodium Other (See Comments)    Elevated liver enzymes   Valproic Acid     unknown    VITALS:  Blood pressure 116/68, pulse (!) 112, temperature 99 F (37.2 C), temperature source Oral, resp. rate 20, height 5\' 10"  (1.778 m), weight (!) 141.2 kg, SpO2 99 %.  PHYSICAL EXAMINATION:   Physical Exam  GENERAL:  60 y.o.-year-old patient lying in the bed with no acute distress. Chronically ill appearing HEENT: Head atraumatic, normocephalic. Oropharynx and nasopharynx clear.  Trach collar+ LUNGS: Normal breath sounds bilaterally, no wheezing, rales, rhonchi. No use of accessory muscles of respiration.  CARDIOVASCULAR: S1, S2 normal. No murmurs, rubs, or gallops.   ABDOMEN: Soft, nontender, nondistended. Bowel sounds present. No organomegaly or mass.  EXTREMITIES: No cyanosis, clubbing or edema b/l.    NEUROLOGIC: grossly nonfocal PSYCHIATRIC:  patient is alert and oriented x 3.  SKIN: No obvious rash, lesion, or ulcer.   LABORATORY PANEL:  CBC Recent Labs  Lab 03/28/21 0412  WBC 10.9*  HGB 12.0*  HCT 36.0*  PLT 190     Chemistries  Recent Labs  Lab 03/28/21 0412  NA 139  K 4.4  CL 99  CO2 33*  GLUCOSE 131*  BUN 20  CREATININE 0.67  CALCIUM 9.2  MG 1.9    Cardiac Enzymes No results for input(s): TROPONINI in the last 168 hours. RADIOLOGY:  No results found. ASSESSMENT AND PLAN:  60 yo morbidly obese male with acute on chronic hypoxic and hypercapnic respiratory failurehistory including systolic CHF chronically, pulmonary hypertension, obstructive sleep apnea, chronic hypoxemia with respiratory failure, obesity hypoventilation syndrome with thoracic restriction, hormonal dysfunction with hypotestosteronism, dyslipidemia, metabolic syndrome, previous subarachnoid hemorrhage, history of traumatic thoracic spinal injury, depression.   Acute on Chronic Hypoxic and Hypercapnic Respiratory Failure in the setting of ACINOTOBACTER SPECIES PNEUMONIA -S/P tracheostomy on 03/07/21 by Dr Kathyrn Sheriff -- currently weaned off trach collar and sats more than 92% on 3 L nasal cannula -- will consult ENT on Tuesday to consider cuffless trach -Bronchodilators and budesonide nebs              ACINETOBACTER CALCOACETICUS/BAUMANNII COMPLEX PNEUMONIA Meets SIRS Criteria (Leukocytosis w/ WBC 14.5, HR  118, RR 26) -s/p course of Unasyn -Repeat CXR on 03/22/21 without evidence of pneumonia -PICC line removed 03/22/21 --pt now  on IV cefepime for 7 days (STARTED 6/24--completed 7/1)   Septic Shock>>resolved Hypertension -Continuous cardiac monitoring -Maintain MAP >65 -Continue clonidine patch and Metoprolol   Hypernatremia>>resolved Mild  Hypokalemia>>resolved -Monitor I&O's / urinary output -Avoid nephrotoxic agents as able -Replace electrolytes as indicated  Acute toxic metabolic encephalopathy>>resolved -Continue Abilify -PRN Klonopin, Oxycodone -Provide supportive care, prn trazodone -PT/OT consult--recommends CIR  morbid obesity with suspected sleep apnea --trach dependent   Procedures:Tracheostomy Family communication :GF at bedside 6/30 CODE STATUS: DNR DVT Prophylaxis :enoxaparin Level of care: Med-Surg Status is: Inpatient  Remains inpatient appropriate because:Inpatient level of care appropriate due to severity of illness  Dispo: The patient is from: Home              Anticipated d/c is to:  TBD              Patient currently is not medically stable to d/c.   Difficult to place patient No   Transfer to med-surg. TOC for d/c planning     TOTAL TIME TAKING CARE OF THIS PATIENT: 25 minutes.  >50% time spent on counselling and coordination of care  Note: This dictation was prepared with Dragon dictation along with smaller phrase technology. Any transcriptional errors that result from this process are unintentional.  Fritzi Mandes M.D    [facility]   CC: Primary care physician; Adin Hector, MD [name], male   DOB: [birth year], 60 y.o.   MRN: [id number]

## 2021-03-30 NOTE — Progress Notes (Signed)
Physical Therapy Treatment Patient Details Name: Eddie Chapman MRN: [id number] DOB: 09/08/1961 Today's Date: 03/30/2021    History of Present Illness Eddie Chapman is a 60 y.o. with a complex medical history including systolic CHF chronically, pulmonary hypertension, obstructive sleep apnea, chronic hypoxemia with respiratory failure, obesity hypoventilation syndrome with thoracic restriction, hormonal dysfunction with hypotestosteronism, dyslipidemia, metabolic syndrome, previous subarachnoid hemorrhage, history of traumatic thoracic spinal injury, & depression. He presented to [facility] ED on 02/23/21 with c/o of confusion & SOB for at least 3 days. Pt was admitted to CCU for ARF. he failed extubation and had trach placed on 03/07/21.    PT Comments    Pt was long sitting in bed upon arriving on 5 L trach collar + 2 L [place]. Resting vitals; BP 136/70, HR 130, sao2 97%. HR did elevate to 140s with standing activity however pt tolerated well. RR elevated as high as 35 bpm during standing but quickly recovers with low 20s. He is extremely talkative throughout session with constant redirecting to focus on desired task. He was able to exit L side of bed with +1 assist. Stood 4 x EOB prior to ambulating ~ 6 ft to recliner. No LOB or unsteadiness in standing. Will set up mobile trach collar for further gait distances next session. Recommend CIR at DC to maximize pt's independence with ADLs.    Follow Up Recommendations  CIR     Equipment Recommendations  None recommended by PT       Precautions / Restrictions Precautions Precautions: Fall Precaution Comments: trach collar (5 L) Restrictions Weight Bearing Restrictions: No    Mobility  Bed Mobility Overal bed mobility: Needs Assistance Bed Mobility: Supine to Sit;Sit to Supine Rolling: Min assist;Mod assist   Supine to sit: Min assist;Mod assist     General bed mobility comments: Pt was able to exit L side of bed with increased time and Vcs for  technique/sequencing    Transfers Overall transfer level: Needs assistance Equipment used: Rolling walker (2 wheeled) Transfers: Sit to/from Stand Sit to Stand: Min assist;From elevated surface;Mod assist         General transfer comment: pt performed STS 4 x throughout session from just minimally elevated bed height  Ambulation/Gait Ambulation/Gait assistance: Min assist Gait Distance (Feet): 6 Feet Assistive device: Rolling walker (2 wheeled)   Gait velocity: decreased   General Gait Details: Pt was easily able to stand and ambulate 6 ft to recliner. had to turn ~ 300 degrees due to lines/tubes but was steady throughout.      Balance Overall balance assessment: Needs assistance Sitting-balance support: Feet supported;Bilateral upper extremity supported Sitting balance-Leahy Scale: Good     Standing balance support: Bilateral upper extremity supported;During functional activity Standing balance-Leahy Scale: Fair Standing balance comment: reliant on UE support        Cognition Arousal/Alertness: Awake/alert Behavior During Therapy: WFL for tasks assessed/performed Overall Cognitive Status: No family/caregiver present to determine baseline cognitive functioning        General Comments: Pt is A and very talkative throughout session. required constants vcs to stop talking  to focus on desired task requested.             Pertinent Vitals/Pain Pain Assessment: No/denies pain     PT Goals (current goals can now be found in the care plan section) Acute Rehab PT Goals Patient Stated Goal: rehab Progress towards PT goals: Progressing toward goals    Frequency    Min 2X/week  PT Plan Current plan remains appropriate    Co-evaluation     PT goals addressed during session: Mobility/safety with mobility;Strengthening/ROM        AM-PAC PT "6 Clicks" Mobility   Outcome Measure  Help needed turning from your back to your side while in a flat bed  without using bedrails?: A Little Help needed moving from lying on your back to sitting on the side of a flat bed without using bedrails?: A Little Help needed moving to and from a bed to a chair (including a wheelchair)?: A Little Help needed standing up from a chair using your arms (e.g., wheelchair or bedside chair)?: A Little Help needed to walk in hospital room?: A Little Help needed climbing 3-5 steps with a railing? : Total 6 Click Score: 16    End of Session Equipment Utilized During Treatment: Oxygen Activity Tolerance: Patient limited by fatigue Patient left: in bed;with call bell/phone within reach Nurse Communication: Mobility status PT Visit Diagnosis: Muscle weakness (generalized) (M62.81);Difficulty in walking, not elsewhere classified (R26.2);Unsteadiness on feet (R26.81)     Time: 4481-8563 PT Time Calculation (min) (ACUTE ONLY): 25 min  Charges:  $Therapeutic Activity: 23-37 mins                     Julaine Fusi PTA 03/30/21, 12:16 PM

## 2021-03-30 NOTE — Consult Note (Signed)
[facility] Nurse Consult Note: Patient receiving care in [facility] ICU 1. Assisted by primary RN. Reason for Consult: back wound Wound type: unclear etiology. Highly likely a very superficial abrasion that could have occurred in repositioning patient in bed. Pressure Injury POA: Yes/No/NA Measurement: 2 cm x 1 cm Wound bed: pink Drainage (amount, consistency, odor) none Periwound: intact Dressing procedure/placement/frequency: Wash the abrasion in the mid back with soap and water, pat dry. Cover with a small foam dressing.  New foam dressing covering area at time of my visit.  Monitor the wound area(s) for worsening of condition such as: Signs/symptoms of infection,  Increase in size,  Development of or worsening of odor, Development of pain, or increased pain at the affected locations.  Notify the medical team if any of these develop.  Thank you for the consult.  Discussed plan of care with the patient and bedside nurse.  [place] nurse will not follow at this time.  Please re-consult the [place] team if needed.  Val Riles, RN, MSN, CWOCN, CNS-BC, pager [phone]

## 2021-03-30 NOTE — Progress Notes (Signed)
   03/30/21 0920  Clinical Encounter Type  Visited With Patient  Visit Type Follow-up;Spiritual support;Social support  Referral From Chaplain  Consult/Referral To [place] visited PT and talked briefly with him. Chaplain ministered with presence, and prayer.

## 2021-03-31 DIAGNOSIS — J9811 Atelectasis: Secondary | ICD-10-CM | POA: Diagnosis not present

## 2021-03-31 DIAGNOSIS — J9602 Acute respiratory failure with hypercapnia: Secondary | ICD-10-CM | POA: Diagnosis not present

## 2021-03-31 DIAGNOSIS — J9601 Acute respiratory failure with hypoxia: Secondary | ICD-10-CM | POA: Diagnosis not present

## 2021-03-31 DIAGNOSIS — E662 Morbid (severe) obesity with alveolar hypoventilation: Secondary | ICD-10-CM | POA: Diagnosis not present

## 2021-03-31 LAB — GLUCOSE, CAPILLARY
Glucose-Capillary: 115 mg/dL — ABNORMAL HIGH (ref 70–99)
Glucose-Capillary: 127 mg/dL — ABNORMAL HIGH (ref 70–99)
Glucose-Capillary: 156 mg/dL — ABNORMAL HIGH (ref 70–99)
Glucose-Capillary: 166 mg/dL — ABNORMAL HIGH (ref 70–99)
Glucose-Capillary: 186 mg/dL — ABNORMAL HIGH (ref 70–99)
Glucose-Capillary: 216 mg/dL — ABNORMAL HIGH (ref 70–99)

## 2021-03-31 NOTE — Progress Notes (Signed)
[facility] at [facility] NAME: Eddie Chapman    MR#:  [id number]  DATE OF BIRTH:  [birth year]  SUBJECTIVE:   Came in with acute hypoxic respiratory failure underwent intubation failed four week trial and now tracheostomy placed. Currently on trach  with Nasal canula and sats stable able to communicate. Tolerating PO diet.   REVIEW OF SYSTEMS:   Review of Systems  Constitutional:  Negative for chills, fever and weight loss.  HENT:  Negative for ear discharge, ear pain and nosebleeds.   Eyes:  Negative for blurred vision, pain and discharge.  Respiratory:  Negative for sputum production, shortness of breath, wheezing and stridor.   Cardiovascular:  Negative for chest pain, palpitations, orthopnea and PND.  Gastrointestinal:  Negative for abdominal pain, diarrhea, nausea and vomiting.  Genitourinary:  Negative for frequency and urgency.  Musculoskeletal:  Negative for back pain and joint pain.  Neurological:  Positive for weakness. Negative for sensory change, speech change and focal weakness.  Psychiatric/Behavioral:  Negative for depression and hallucinations. The patient is not nervous/anxious.   Tolerating Diet:yes Tolerating PT: CIR  DRUG ALLERGIES:   Allergies  Allergen Reactions   Divalproex Sodium Other (See Comments)    Elevated liver enzymes   Valproic Acid     unknown    VITALS:  Blood pressure 132/79, pulse (!) 108, temperature 98 F (36.7 C), temperature source Oral, resp. rate (!) 24, height 5\' 10"  (1.778 m), weight (!) 136.2 kg, SpO2 96 %.  PHYSICAL EXAMINATION:   Physical Exam  GENERAL:  60 y.o.-year-old patient lying in the bed with no acute distress. Chronically ill appearing HEENT: Head atraumatic, normocephalic. Oropharynx and nasopharynx clear.  Trach collar+ LUNGS: Normal breath sounds bilaterally, no wheezing, rales, rhonchi. No use of accessory muscles of respiration.  CARDIOVASCULAR: S1, S2 normal. No murmurs,  rubs, or gallops.  ABDOMEN: Soft, nontender, nondistended. Bowel sounds present. No organomegaly or mass.  EXTREMITIES: No cyanosis, clubbing or edema b/l.    NEUROLOGIC: grossly nonfocal PSYCHIATRIC:  patient is alert and oriented x 3.  SKIN: No obvious rash, lesion, or ulcer.   LABORATORY PANEL:  CBC Recent Labs  Lab 03/28/21 0412  WBC 10.9*  HGB 12.0*  HCT 36.0*  PLT 190     Chemistries  Recent Labs  Lab 03/28/21 0412  NA 139  K 4.4  CL 99  CO2 33*  GLUCOSE 131*  BUN 20  CREATININE 0.67  CALCIUM 9.2  MG 1.9    Cardiac Enzymes No results for input(s): TROPONINI in the last 168 hours. RADIOLOGY:  No results found. ASSESSMENT AND PLAN:  60 yo morbidly obese male with acute on chronic hypoxic and hypercapnic respiratory failurehistory including systolic CHF chronically, pulmonary hypertension, obstructive sleep apnea, chronic hypoxemia with respiratory failure, obesity hypoventilation syndrome with thoracic restriction, hormonal dysfunction with hypotestosteronism, dyslipidemia, metabolic syndrome, previous subarachnoid hemorrhage, history of traumatic thoracic spinal injury, depression.   Acute on Chronic Hypoxic and Hypercapnic Respiratory Failure in the setting of ACINOTOBACTER SPECIES PNEUMONIA -S/P tracheostomy on 03/07/21 by Dr Kathyrn Sheriff -- currently weaned off trach collar and sats more than 92% on 3 L nasal cannula -- will consult ENT on Tuesday to consider cuffless trach -Bronchodilators and budesonide nebs              ACINETOBACTER CALCOACETICUS/BAUMANNII COMPLEX PNEUMONIA Meets SIRS Criteria (Leukocytosis w/ WBC 14.5, HR  118, RR 26) -s/p course of Unasyn -Repeat CXR on 03/22/21 without evidence of pneumonia -PICC  line removed 03/22/21 --pt now on IV cefepime for 7 days (STARTED 6/24--completed 7/1)   Septic Shock>>resolved Hypertension -Continuous cardiac monitoring -Maintain MAP >65 -Continue clonidine patch and Metoprolol    Hypernatremia>>resolved Mild Hypokalemia>>resolved -Monitor I&O's / urinary output -Avoid nephrotoxic agents as able -Replace electrolytes as indicated  Acute toxic metabolic encephalopathy>>resolved -Continue Abilify -PRN Klonopin, Oxycodone -Provide supportive care, prn trazodone -PT/OT consult--recommends CIR  morbid obesity with suspected sleep apnea --trach dependent   Procedures:Tracheostomy Family communication :GF at bedside 6/30 CODE STATUS: DNR DVT Prophylaxis :enoxaparin Level of care: Med-Surg Status is: Inpatient  Remains inpatient appropriate because:Inpatient level of care appropriate due to severity of illness  Dispo: The patient is from: Home              Anticipated d/c is to:  TBD              Patient currently is improving and waiting for CIR evaluation   Difficult to place patient No   TOC for d/c planning     TOTAL TIME TAKING CARE OF THIS PATIENT: 25 minutes.  >50% time spent on counselling and coordination of care  Note: This dictation was prepared with Dragon dictation along with smaller phrase technology. Any transcriptional errors that result from this process are unintentional.  Eddie Chapman M.D    Eddie Chapman   CC: Primary care physician; Eddie Hector, MD [name], male   DOB: 1961-09-27, 60 y.o.   MRN: [id number]

## 2021-03-31 NOTE — Plan of Care (Signed)
Tachypnea and tachycardia continues this shift.  Lurline Idol remains in place. Gurgling rhonchi at times and suctioned for thick mucous. Pulse oximetry alarmed saturations in 80s.  Pulse ox probe replaced. Respiratory therapist in to assess and oxygen adjusted to 10L/40%.  Decreased saturations with activity in bed.  Incontinent of large amounts urine.  Problem: Health Behavior/Discharge Planning: Goal: Ability to manage health-related needs will improve Outcome: Not Progressing   Problem: Clinical Measurements: Goal: Ability to maintain clinical measurements within normal limits will improve Outcome: Not Progressing Goal: Will remain free from infection Outcome: Not Progressing Goal: Diagnostic test results will improve Outcome: Not Progressing Goal: Respiratory complications will improve Outcome: Not Progressing Goal: Cardiovascular complication will be avoided Outcome: Not Progressing   Problem: Activity: Goal: Risk for activity intolerance will decrease Outcome: Not Progressing   Problem: Coping: Goal: Level of anxiety will decrease Outcome: Not Progressing   Problem: Pain Managment: Goal: General experience of comfort will improve Outcome: Not Progressing

## 2021-04-01 DIAGNOSIS — J9601 Acute respiratory failure with hypoxia: Secondary | ICD-10-CM | POA: Diagnosis not present

## 2021-04-01 DIAGNOSIS — J9602 Acute respiratory failure with hypercapnia: Secondary | ICD-10-CM | POA: Diagnosis not present

## 2021-04-01 DIAGNOSIS — E662 Morbid (severe) obesity with alveolar hypoventilation: Secondary | ICD-10-CM | POA: Diagnosis not present

## 2021-04-01 DIAGNOSIS — J9811 Atelectasis: Secondary | ICD-10-CM | POA: Diagnosis not present

## 2021-04-01 LAB — GLUCOSE, CAPILLARY
Glucose-Capillary: 144 mg/dL — ABNORMAL HIGH (ref 70–99)
Glucose-Capillary: 151 mg/dL — ABNORMAL HIGH (ref 70–99)
Glucose-Capillary: 155 mg/dL — ABNORMAL HIGH (ref 70–99)
Glucose-Capillary: 191 mg/dL — ABNORMAL HIGH (ref 70–99)
Glucose-Capillary: 215 mg/dL — ABNORMAL HIGH (ref 70–99)
Glucose-Capillary: 232 mg/dL — ABNORMAL HIGH (ref 70–99)

## 2021-04-01 MED ORDER — DILTIAZEM HCL 25 MG/5ML IV SOLN
5.0000 mg | Freq: Once | INTRAVENOUS | Status: AC
  Administered 2021-04-01: 5 mg via INTRAVENOUS
  Filled 2021-04-01: qty 5

## 2021-04-01 NOTE — Progress Notes (Signed)
Placed patient on trach collar 28% for sleep. Speaking valve removed. Trach cuff remains deflated. O2 saturation noted at 96. Patient alert and cooperative. Tolerated interventions well.

## 2021-04-01 NOTE — Progress Notes (Signed)
[place] at [place] NAME: Eddie Chapman    MR#:  [id number]  DATE OF BIRTH:  29-Dec-1960  SUBJECTIVE:   Came in with acute hypoxic respiratory failure underwent intubation failed four week trial and now tracheostomy placed. Currently on trach  with Nasal canula and sats stable able to communicate. Tolerating PO diet.   REVIEW OF SYSTEMS:   Review of Systems  Constitutional:  Negative for chills, fever and weight loss.  HENT:  Negative for ear discharge, ear pain and nosebleeds.   Eyes:  Negative for blurred vision, pain and discharge.  Respiratory:  Negative for sputum production, shortness of breath, wheezing and stridor.   Cardiovascular:  Negative for chest pain, palpitations, orthopnea and PND.  Gastrointestinal:  Negative for abdominal pain, diarrhea, nausea and vomiting.  Genitourinary:  Negative for frequency and urgency.  Musculoskeletal:  Negative for back pain and joint pain.  Neurological:  Positive for weakness. Negative for sensory change, speech change and focal weakness.  Psychiatric/Behavioral:  Negative for depression and hallucinations. The patient is not nervous/anxious.   Tolerating Diet:yes Tolerating PT: CIR  DRUG ALLERGIES:   Allergies  Allergen Reactions   Divalproex Sodium Other (See Comments)    Elevated liver enzymes   Valproic Acid     unknown    VITALS:  Blood pressure 130/71, pulse (!) 105, temperature 98.5 F (36.9 C), temperature source Oral, resp. rate 20, height 5\' 10"  (1.778 m), weight (!) 136.2 kg, SpO2 94 %.  PHYSICAL EXAMINATION:   Physical Exam  GENERAL:  60 y.o.-year-old patient lying in the bed with no acute distress. Chronically ill appearing HEENT: Head atraumatic, normocephalic. Oropharynx and nasopharynx clear.  Trach collar+ LUNGS: Normal breath sounds bilaterally, no wheezing, rales, rhonchi. No use of accessory muscles of respiration.  CARDIOVASCULAR: S1, S2 normal. No murmurs,  rubs, or gallops.  ABDOMEN: Soft, nontender, nondistended. Bowel sounds present. No organomegaly or mass.  EXTREMITIES: No cyanosis, clubbing or edema b/l.    NEUROLOGIC: grossly nonfocal PSYCHIATRIC:  patient is alert and oriented x 3.  SKIN: No obvious rash, lesion, or ulcer.   LABORATORY PANEL:  CBC Recent Labs  Lab 03/28/21 0412  WBC 10.9*  HGB 12.0*  HCT 36.0*  PLT 190     Chemistries  Recent Labs  Lab 03/28/21 0412  NA 139  K 4.4  CL 99  CO2 33*  GLUCOSE 131*  BUN 20  CREATININE 0.67  CALCIUM 9.2  MG 1.9    Cardiac Enzymes No results for input(s): TROPONINI in the last 168 hours. RADIOLOGY:  No results found. ASSESSMENT AND PLAN:  60 yo morbidly obese male with acute on chronic hypoxic and hypercapnic respiratory failurehistory including systolic CHF chronically, pulmonary hypertension, obstructive sleep apnea, chronic hypoxemia with respiratory failure, obesity hypoventilation syndrome with thoracic restriction, hormonal dysfunction with hypotestosteronism, dyslipidemia, metabolic syndrome, previous subarachnoid hemorrhage, history of traumatic thoracic spinal injury, depression.   Acute on Chronic Hypoxic and Hypercapnic Respiratory Failure in the setting of  PNEUMONIA -S/P tracheostomy on 03/07/21 by Dr Kathyrn Sheriff -- currently weaned off trach collar and sats more than 92% on 3 L nasal cannula -- will consult ENT on Tuesday to consider cuffless trach -Bronchodilators and budesonide nebs              ACINETOBACTER CALCOACETICUS/BAUMANNII COMPLEX PNEUMONIA Meets SIRS Criteria (Leukocytosis w/ WBC 14.5, HR  118, RR 26) -s/p course of Unasyn -Repeat CXR on 03/22/21 without evidence of pneumonia -PICC line removed  03/22/21 --pt now on IV cefepime for 7 days (STARTED 6/24--completed 7/1)   Septic Shock>>resolved Hypertension -Continuous cardiac monitoring -Maintain MAP >65 -Continue clonidine patch and Metoprolol   Hypernatremia>>resolved Mild  Hypokalemia>>resolved -Monitor I&O's / urinary output -Avoid nephrotoxic agents as able -Replace electrolytes as indicated  Acute toxic metabolic encephalopathy>>resolved  Depression -Continue Abilify prn trazodone  -PT/OT consult--recommends CIR  morbid obesity with suspected sleep apnea --trach dependent   Procedures:Tracheostomy Family communication :GF at bedside 6/30 CODE STATUS: DNR DVT Prophylaxis :enoxaparin Level of care: Med-Surg Status is: Inpatient  Dispo: The patient is from: Home              Anticipated d/c is to:  TBD              Patient currently is improving and waiting for CIR evaluation   Difficult to place patient No   TOC for d/c planning     TOTAL TIME TAKING CARE OF THIS PATIENT: 25 minutes.  >50% time spent on counselling and coordination of care  Note: This dictation was prepared with Dragon dictation along with smaller phrase technology. Any transcriptional errors that result from this process are unintentional.  Fritzi Mandes M.D    [facility]   CC: Primary care physician; Adin Hector, MD [name], male   DOB: December 14, 1960, 60 y.o.   MRN: [id number]

## 2021-04-01 NOTE — Progress Notes (Signed)
   04/01/21 1955  Assess: MEWS Score  Temp 98.4 F (36.9 C)  BP 125/64  Pulse Rate (!) 123  Resp 20  Level of Consciousness Alert  SpO2 91 %  O2 Device Nasal Cannula  O2 Flow Rate (L/min) 2 L/min  Assess: MEWS Score  MEWS Temp 0  MEWS Systolic 0  MEWS Pulse 2  MEWS RR 0  MEWS LOC 0  MEWS Score 2  MEWS Score Color Yellow  Assess: if the MEWS score is Yellow or Red  Were vital signs taken at a resting state? Yes  Focused Assessment No change from prior assessment  Does the patient meet 2 or more of the SIRS criteria? No  MEWS guidelines implemented *See Row Information* No, vital signs rechecked  Treat  MEWS Interventions Administered scheduled meds/treatments  Pain Scale 0-10  Pain Score 0  Assess: SIRS CRITERIA  SIRS Temperature  0  SIRS Pulse 1  SIRS Respirations  0  SIRS WBC 0  SIRS Score Sum  1   Pt HR running at 120s, Bp stable. Will given his scheduled metoprolol tonight and will recheck his V/S.

## 2021-04-02 DIAGNOSIS — E662 Morbid (severe) obesity with alveolar hypoventilation: Secondary | ICD-10-CM | POA: Diagnosis not present

## 2021-04-02 DIAGNOSIS — J9811 Atelectasis: Secondary | ICD-10-CM | POA: Diagnosis not present

## 2021-04-02 DIAGNOSIS — J9602 Acute respiratory failure with hypercapnia: Secondary | ICD-10-CM | POA: Diagnosis not present

## 2021-04-02 DIAGNOSIS — J9601 Acute respiratory failure with hypoxia: Secondary | ICD-10-CM | POA: Diagnosis not present

## 2021-04-02 LAB — GLUCOSE, CAPILLARY
Glucose-Capillary: 143 mg/dL — ABNORMAL HIGH (ref 70–99)
Glucose-Capillary: 151 mg/dL — ABNORMAL HIGH (ref 70–99)
Glucose-Capillary: 152 mg/dL — ABNORMAL HIGH (ref 70–99)
Glucose-Capillary: 165 mg/dL — ABNORMAL HIGH (ref 70–99)
Glucose-Capillary: 196 mg/dL — ABNORMAL HIGH (ref 70–99)
Glucose-Capillary: 263 mg/dL — ABNORMAL HIGH (ref 70–99)

## 2021-04-02 MED ORDER — DILTIAZEM HCL 25 MG/5ML IV SOLN
5.0000 mg | Freq: Once | INTRAVENOUS | Status: AC
  Administered 2021-04-02: 5 mg via INTRAVENOUS
  Filled 2021-04-02: qty 5

## 2021-04-02 MED ORDER — METOPROLOL TARTRATE 50 MG PO TABS
50.0000 mg | ORAL_TABLET | Freq: Two times a day (BID) | ORAL | Status: DC
  Administered 2021-04-02 – 2021-04-04 (×4): 50 mg via ORAL
  Filled 2021-04-02 (×4): qty 1

## 2021-04-02 NOTE — Progress Notes (Signed)
Placed patient on trach collar for sleep

## 2021-04-02 NOTE — Progress Notes (Signed)
Took pt off PMV and started his trach collar for the night and pt was desaturating into the high 80's. Place pt back on PMV and put cannula back on but turned it up to 6l for the night. Pt is saturating 97% when RT left room.

## 2021-04-02 NOTE — Progress Notes (Signed)
Now off trach collar. Placed back on 2liter [place] with speaking valve

## 2021-04-02 NOTE — Care Management Important Message (Signed)
Important Message  Patient Details  Name: Eddie Chapman MRN: [id number] Date of Birth: 08-Mar-1961   Medicare Important Message Given:  Yes     Dannette Barbara 04/02/2021, 1:16 PM

## 2021-04-02 NOTE — Progress Notes (Signed)
[place] at [place] NAME: Eddie Chapman    MR#:  [id number]  DATE OF BIRTH:  1961/06/26  SUBJECTIVE:   Came in with acute hypoxic respiratory failure underwent intubation failed four week trial and now tracheostomy placed.  Currently on trach  with Nasal canula and sats stable able to communicate. Tolerating PO diet.  REVIEW OF SYSTEMS:   Review of Systems  Constitutional:  Negative for chills, fever and weight loss.  HENT:  Negative for ear discharge, ear pain and nosebleeds.   Eyes:  Negative for blurred vision, pain and discharge.  Respiratory:  Negative for sputum production, shortness of breath, wheezing and stridor.   Cardiovascular:  Negative for chest pain, palpitations, orthopnea and PND.  Gastrointestinal:  Negative for abdominal pain, diarrhea, nausea and vomiting.  Genitourinary:  Negative for frequency and urgency.  Musculoskeletal:  Negative for back pain and joint pain.  Neurological:  Positive for weakness. Negative for sensory change, speech change and focal weakness.  Psychiatric/Behavioral:  Negative for depression and hallucinations. The patient is not nervous/anxious.   Tolerating Diet:yes Tolerating PT: CIR  DRUG ALLERGIES:   Allergies  Allergen Reactions   Divalproex Sodium Other (See Comments)    Elevated liver enzymes   Valproic Acid     unknown    VITALS:  Blood pressure 133/90, pulse (!) 119, temperature 98.5 F (36.9 C), temperature source Oral, resp. rate 16, height 5\' 10"  (1.778 m), weight (!) 136.1 kg, SpO2 92 %.  PHYSICAL EXAMINATION:   Physical Exam  GENERAL:  60 y.o.-year-old patient lying in the bed with no acute distress. Morbidly obeseChronically ill appearing HEENT: Head atraumatic, normocephalic. Oropharynx and nasopharynx clear.  Trach with PMV + LUNGS: Normal breath sounds bilaterally, no wheezing, rales, rhonchi. No use of accessory muscles of respiration.  CARDIOVASCULAR: S1, S2  normal. No murmurs, rubs, or gallops.  ABDOMEN: Soft, nontender, nondistended.  Abdominal obesity EXTREMITIES: No cyanosis, clubbing or edema b/l.    NEUROLOGIC: grossly nonfocal PSYCHIATRIC:  patient is alert and oriented x 3.  SKIN: No obvious rash, lesion, or ulcer.   LABORATORY PANEL:  CBC Recent Labs  Lab 03/28/21 0412  WBC 10.9*  HGB 12.0*  HCT 36.0*  PLT 190     Chemistries  Recent Labs  Lab 03/28/21 0412  NA 139  K 4.4  CL 99  CO2 33*  GLUCOSE 131*  BUN 20  CREATININE 0.67  CALCIUM 9.2  MG 1.9    Cardiac Enzymes No results for input(s): TROPONINI in the last 168 hours. RADIOLOGY:  No results found. ASSESSMENT AND PLAN:  60 yo morbidly obese male with acute on chronic hypoxic and hypercapnic respiratory failurehistory including systolic CHF chronically, pulmonary hypertension, obstructive sleep apnea, chronic hypoxemia with respiratory failure, obesity hypoventilation syndrome with thoracic restriction, hormonal dysfunction with hypotestosteronism, dyslipidemia, metabolic syndrome, previous subarachnoid hemorrhage, history of traumatic thoracic spinal injury, depression.   Acute on Chronic Hypoxic and Hypercapnic Respiratory Failure in the setting of  PNEUMONIA -Chronic respiratory failure with hypoxia now trach dependent -S/P tracheostomy on 03/07/21 by Dr Kathyrn Sheriff -- currently weaned off trach collar and sats more than 92% on 3 L nasal cannula -- will consult ENT on Tuesday to consider cuffless trach --Bronchodilators and budesonide nebs              ACINETOBACTER CALCOACETICUS/BAUMANNII COMPLEX PNEUMONIA Meets SIRS Criteria (Leukocytosis w/ WBC 14.5, HR  118, RR 26) -s/p course of Unasyn -Repeat CXR on 03/22/21 without  evidence of pneumonia --pt now on IV cefepime for 7 days (STARTED 6/24--completed 7/1)   Septic Shock>>resolved Hypertension Tachycardia-chronic -Continuous cardiac monitoring -Maintain MAP >65 -Continue clonidine patch and Metoprolol    Hypernatremia>>resolved Mild Hypokalemia>>resolved -Monitor I&O's / urinary output -Avoid nephrotoxic agents as able -Replace electrolytes as indicated  Acute toxic metabolic encephalopathy>>resolved  Depression -Continue Abilify prn trazodone  -PT/OT consult--recommends CIR  morbid obesity with suspected sleep apnea --trach dependent   Procedures:Tracheostomy Family communication :none today. Per pt his girlfriend has been updated by him CODE STATUS: DNR DVT Prophylaxis :enoxaparin Level of care: Med-Surg Status is: Inpatient  Dispo: The patient is from: Home              Anticipated d/c is to:  TBD              Patient currently is improving and waiting for [facility] CIR evaluation   Difficult to place patient No   TOC for d/c planning--TOC to check with [facility] CIR evaluation status     TOTAL TIME TAKING CARE OF THIS PATIENT: 25 minutes.  >50% time spent on counselling and coordination of care  Note: This dictation was prepared with Dragon dictation along with smaller phrase technology. Any transcriptional errors that result from this process are unintentional.  Fritzi Mandes M.D    [facility]   CC: Primary care physician; Adin Hector, MD [name], male   DOB: 1960-10-28, 60 y.o.   MRN: [id number]

## 2021-04-02 NOTE — Progress Notes (Addendum)
   04/01/21 2100  Assess: MEWS Score  Temp 98 F (36.7 C)  BP 139/90  Pulse Rate (!) 118  Resp 20  Level of Consciousness Alert  SpO2 92 %  O2 Device Nasal Cannula  O2 Flow Rate (L/min) 2 L/min  Assess: MEWS Score  MEWS Temp 0  MEWS Systolic 0  MEWS Pulse 2  MEWS RR 0  MEWS LOC 0  MEWS Score 2  MEWS Score Color Yellow  Assess: if the MEWS score is Yellow or Red  Were vital signs taken at a resting state? Yes  Focused Assessment No change from prior assessment  Does the patient meet 2 or more of the SIRS criteria? No  MEWS guidelines implemented *See Row Information* Yes  Treat  MEWS Interventions Other (Comment) (awaiting for Dr's order)  Pain Scale 0-10  Pain Score 0  Take Vital Signs  Increase Vital Sign Frequency  Yellow: Q 2hr X 2 then Q 4hr X 2, if remains yellow, continue Q 4hrs  Escalate  MEWS: Escalate Yellow: discuss with charge nurse/RN and consider discussing with provider and RRT  Notify: Charge Nurse/RN  Name of Charge Nurse/RN Notified Almyra Free RN  Date Charge Nurse/RN Notified 04/01/21  Time Charge Nurse/RN Notified 2143  Notify: Provider  Provider Name/Title Dr [name] Ace  Date Provider Notified 04/01/21  Time Provider Notified 2144  Notification Type Page  Notification Reason Other (Comment) (HR still elevated 110s after giving metoprolol)  Provider response Other (Comment) (awaiting for orders)  Date of Provider Response 04/01/21  Time of Provider Response 2105  Assess: SIRS CRITERIA  SIRS Temperature  0  SIRS Pulse 1  SIRS Respirations  0  SIRS WBC 0  SIRS Score Sum  1  Inserted for Audrea Muscat Ribey-Pagdatoon RN

## 2021-04-03 DIAGNOSIS — J9811 Atelectasis: Secondary | ICD-10-CM | POA: Diagnosis not present

## 2021-04-03 DIAGNOSIS — J9601 Acute respiratory failure with hypoxia: Secondary | ICD-10-CM | POA: Diagnosis not present

## 2021-04-03 DIAGNOSIS — J9602 Acute respiratory failure with hypercapnia: Secondary | ICD-10-CM | POA: Diagnosis not present

## 2021-04-03 DIAGNOSIS — E662 Morbid (severe) obesity with alveolar hypoventilation: Secondary | ICD-10-CM | POA: Diagnosis not present

## 2021-04-03 LAB — GLUCOSE, CAPILLARY
Glucose-Capillary: 124 mg/dL — ABNORMAL HIGH (ref 70–99)
Glucose-Capillary: 135 mg/dL — ABNORMAL HIGH (ref 70–99)
Glucose-Capillary: 159 mg/dL — ABNORMAL HIGH (ref 70–99)
Glucose-Capillary: 187 mg/dL — ABNORMAL HIGH (ref 70–99)
Glucose-Capillary: 201 mg/dL — ABNORMAL HIGH (ref 70–99)
Glucose-Capillary: 225 mg/dL — ABNORMAL HIGH (ref 70–99)

## 2021-04-03 MED ORDER — SODIUM CHLORIDE 0.9 % IV BOLUS
250.0000 mL | Freq: Once | INTRAVENOUS | Status: AC
  Administered 2021-04-03: 250 mL via INTRAVENOUS

## 2021-04-03 MED ORDER — PANTOPRAZOLE SODIUM 40 MG PO TBEC
40.0000 mg | DELAYED_RELEASE_TABLET | Freq: Every day | ORAL | Status: DC
  Administered 2021-04-04: 40 mg via ORAL
  Filled 2021-04-03: qty 1

## 2021-04-03 MED ORDER — METFORMIN HCL 500 MG PO TABS: 500.0000 mg | ORAL_TABLET | Freq: Two times a day (BID) | ORAL | Status: DC

## 2021-04-03 MED ORDER — DILTIAZEM HCL 25 MG/5ML IV SOLN
2.5000 mg | Freq: Once | INTRAVENOUS | Status: AC
  Administered 2021-04-03: 2.5 mg via INTRAVENOUS
  Filled 2021-04-03: qty 5

## 2021-04-03 MED ORDER — DILTIAZEM HCL 30 MG PO TABS
30.0000 mg | ORAL_TABLET | Freq: Four times a day (QID) | ORAL | Status: DC
  Administered 2021-04-03 – 2021-04-04 (×4): 30 mg via ORAL
  Filled 2021-04-03 (×7): qty 1

## 2021-04-03 MED ORDER — ASCORBIC ACID 500 MG PO TABS
250.0000 mg | ORAL_TABLET | Freq: Two times a day (BID) | ORAL | Status: DC
  Administered 2021-04-03 – 2021-04-04 (×2): 250 mg via ORAL
  Filled 2021-04-03 (×2): qty 1

## 2021-04-03 MED ORDER — INSULIN ASPART 100 UNIT/ML IJ SOLN
0.0000 [IU] | Freq: Three times a day (TID) | INTRAMUSCULAR | Status: DC
  Administered 2021-04-03: 3 [IU] via SUBCUTANEOUS
  Administered 2021-04-03: 5 [IU] via SUBCUTANEOUS
  Administered 2021-04-03 – 2021-04-04 (×2): 3 [IU] via SUBCUTANEOUS
  Administered 2021-04-04: 2 [IU] via SUBCUTANEOUS
  Filled 2021-04-03 (×5): qty 1

## 2021-04-03 NOTE — Progress Notes (Signed)
NAME:  Eddie Chapman, MRN:  [id number], DOB:  07/28/1961, LOS: 54 ADMISSION DATE:  02/23/2021  BRIEF SYNOPSIS 60 yo morbidly obese male with acute on chronic hypoxic and hypercapnic respiratory failure due to [place] and AECOPD, along with severe toxic metabolic encephalopathy.  Failure to wean from vent requiring Tracheostomy on 03/07/2021.   History of Present Illness:    Patient has a complex comorbid history including systolic CHF chronically, pulmonary hypertension, obstructive sleep apnea, chronic hypoxemia with respiratory failure, obesity hypoventilation syndrome with thoracic restriction, hormonal dysfunction with hypotestosteronism, dyslipidemia, metabolic syndrome, previous subarachnoid hemorrhage, history of traumatic thoracic spinal injury, depression.  Patient was admitted in January to the intensive care unit with altered mental status acute hypoxemic and hypercapnic respiratory failure with metabolic encephalopathy and left lower lobe severe pneumonia.  Throughout hospitalization he was noted to have lethargy with hypercapnic encephalopathy and became unresponsive a few times even postextubation while on medical floor.  Patient has never smoked in the past.   On this admission he was poorly responsive with hypercapnic hypoxemic respiratory failure         Hospitalization events:      02/24/21- Patient responded well to mechanical ventilation with improvement in hypercapnia on ABG this am.  Seems that he had THC induced apnea/hypopnea based on laboratory testing.  There is bibasilar atelectasis induced severe hypoxemia, recruitment Metaneb therapy has been ordered. Plan to repeat CXR this afternoon with SBT today.  Reviewed plan with RN and RT today.     02/25/21- patient was unable to pass SBT today. He was able to come off propofol with RASS-0 and is weaned down on levophed.     02/26/21- patient continues to require levophed, he has been difficult to  wean. I have asked ID for consultation due to concern for sepsis possibly due to CAP. Unable to perform SBT today. Family at bedside.   02/27/21- patient on PRVC with heavy secretions, difficulty weaning with high narcotic tolerance. Patient had suicide attempt with requirement for psych.   6/1 failing to wean from vent, DNR status 6/2 failed to wean from vent, severe hypoxia 6/3 severe hypoxia, failure to wean from vent, needs [facility], ENT consulted 6/4 failure to wean from  6/6 failure to wean from vent, plan for [facility] 6/7 failure to wean from vent 6/8 plan for [facility] TODAY 6/9 left foot cyanosis, vasc surgery consulted 6/10: Agitation overnight, added oxycodone, valium, seroquel.  SBT in progress.  Working with PT 6/11: Less agitated, however, did not tolerate SBT today 6/12: Dilaudid being weaned off, less agitated.  Can actually speak with trach.  Tolerating SBT 03/12/21- patient febrile, Trache aspirate sent for viral workup. Patient with anasarca on examination. Adequate UOP.  On PRVC 35%, GCS3T. 03/13/21- TCC having difficulty finding CIR placement.  There is RLE swelling and hypothermia and DVT study was ordered, he is with severe anxiety during SBT today and we reviewed MAR with addition of Abilify due to no QTC effect.   03/14/21- patient is awake, he is attempting again for trache collar trial today.  His secretions are improved, he is vocalizing and trying to write for communication. 03/15/21- patient is alert he communicates via gesturing and attempts to vocalize.  Still in withdrawal and on precedex but calm and appropriate.  Plan is to wean precedex and get to PT/OT. 03/16/21- patient is improved mentation, plan for PMV and SLP with ongoing PNMR transfer for recovery.  He will need psych eval at some point  due to suicide attempt 03/17/21- patient is speaking via gesturing and writing.  Trach changed to XLT 6.  Ongoing plan for CIR.   03/18/21- patient continue to improve, he will be able  to join PT/OT with plan to move to [facility] inpatient rehab asap. 03/19/21- SBT vs Trach collar trials during day, rest on vent at night.  Lasix x1 today 03/20/21-Pts current vent settings SBT 15/5 and tolerating well will attempt to trach collar 03/21/21- Tolerating Trach collar trials, work with PT/OT 03/22/21- Continues to tolerate Trach collar trial during day. Speech consulted for PMV. New Leukocytosis (WBC 15.5) and low grade fever.  CXR without new infiltrate. No foley, and denies dysuria.  Plan to remove PICC line. 03/23/21- Persistent Low grade fever (t max 100.6), WBC slightly improved to 14.5. Meets SIRS Criteria ~Sepsis workup in progress, placed on empiric Cefepime & Vancomycin, tolerating TC trials during day.  Holding Lasix today due to soft BP and worsening Creatinine 6/26 started TCT 6/27 remains on TCT 6/28 remains on TCT, toerating oral feeds 04/03/2021-patient is interactive and is able to vocalize with Passy-Muir valve.  He gets up to go to the bathroom on his own and states that he is confident on his feet however is not back to his baseline strength level.  Have discussed his care plan with Dr. Fritzi Mandes this morning and there is plan for possible rehab however patient has been improving so well that there is also an option for discharge home with home PT which is what he would prefer.  His secretions were good he is requiring 3 L of oxygen and 4 L with exertion.  This morning during nebulizer therapy we did increase it to 6 L transiently.  Ear nose and throat consultation has been placed for downgrading of trach to cuffless.   Micro Data:  5/27: SARS-CoV-2 and influenza PCR>> negative 5/27: Blood culture x2>> no growth 5/27: Urine>> no growth 5/27: MRSA PCR>> negative 5/28: Sputum>> normal respiratory flora 5/30: Respiratory viral panel>> negative 5/31: SPUTUM CULTURE >> ACINETOBACTER CALCOACETICUS/BAUMANNII COMPLEX  6/13: Tracheal aspirate>>ACINETOBACTER CALCOACETICUS/BAUMANNII  COMPLEX 6/24: Blood culture x2>> 6/24: Tracheal aspirate>>       Antimicrobials:  Azithromycin 5/27>>5/29 Ceftriaxone 5/27>>5/29 Vancomycin 5/29>>5/30 Zosyn 5/29>>6/1 Unasyn 6/1>>6/10 Cefepime 6/24>> Vancomycin 6/24>>    Interim History / Subjective:  Alert and awake TCT Eating Moving all ext, follows commands         Objective   Blood pressure (!) 128/93, pulse (!) 110, temperature 98.2 F (36.8 C), resp. rate (!) 26, height 5\' 10"  (1.778 m), weight (!) 147.2 kg, SpO2 96 %.        Intake/Output Summary (Last 24 hours) at 04/03/2021 0731 Last data filed at 04/03/2021 0504 Gross per 24 hour  Intake 600 ml  Output 300 ml  Net 300 ml    Filed Weights   03/31/21 0433 04/02/21 0500 04/03/21 0256  Weight: (!) 136.2 kg (!) 136.1 kg (!) 147.2 kg     Review of Systems:  Gen:  Denies  fever, sweats, chills weight loss  HEENT: Denies blurred vision, double vision, ear pain, eye pain, hearing loss, nose bleeds, sore throat Cardiac:  No dizziness, chest pain or heaviness, chest tightness,edema, No JVD Resp:   No cough, -sputum production, -shortness of breath,-wheezing, -hemoptysis,  Other:  All other systems negative   Physical Examination:   General Appearance: No distress  Neuro:without focal findings,  speech normal,  HEENT: PERRLA, EOM intact.  S/p trach positive Passy-Muir valve Pulmonary: normal  breath sounds, No wheezing.  CardiovascularNormal S1,S2.  No m/r/g.   Abdomen is obese nontender Extremities without lower extremity edema Skin is intact Musculoskeletal range of motion is normal    Labs/imaging that I havepersonally reviewed  (right click and "Reselect all SmartList Selections" daily)       ASSESSMENT AND PLAN SYNOPSIS    Acute on Chronic Hypoxic and Hypercapnic Respiratory Failure in the setting of ACINOTOBACTER SPECIES PNEUMONIA-completed abx course and AECOPD failure to wean from vent severe morbid obesity with COPD and OSA and Cor  pulmonale s/p [facility]  Doing well with TCT Oxygen as needed PT/OT SPeech Therapy following   ACINETOBACTER CALCOACETICUS/BAUMANNII COMPLEX PNEUMONIA>>treated   CARDIAC ICU monitoring  SEPTIC SHOCK-resolved   INFECTIOUS DISEASE-completed antibiotic regimen -continue antibiotics as prescribed -follow up cultures -follow up ID consultation  ELECTROLYTES -follow labs as needed -replace as needed -pharmacy consultation and following     Best practice (right click and "Reselect all SmartList Selections" daily)  Diet:  Tube Feed  Pain/Anxiety/Delirium protocol (if indicated): Yes (RASS goal 0 to -1 ) VAP protocol (if indicated): Yes DVT prophylaxis: Lovenox GI prophylaxis: PPI Glucose control:  SSI Yes Central venous access:  No Arterial line:  N/A Foley:  Yes and still needed (frequent incontinence with skin breakdown) Mobility:  OOB as tolerated PT consulted: yes Code Status:  DNR Disposition: SD     Labs   CBC: Recent Labs  Lab 03/28/21 0412  WBC 10.9*  NEUTROABS 8.0*  HGB 12.0*  HCT 36.0*  MCV 96.8  PLT 190     Basic Metabolic Panel: Recent Labs  Lab 03/28/21 0412  NA 139  K 4.4  CL 99  CO2 33*  GLUCOSE 131*  BUN 20  CREATININE 0.67  CALCIUM 9.2  MG 1.9  PHOS 2.8    GFR: Estimated Creatinine Clearance: 140.9 mL/min (by C-G formula based on SCr of 0.67 mg/dL). Recent Labs  Lab 03/28/21 0412  WBC 10.9*     Liver Function Tests: No results for input(s): AST, ALT, ALKPHOS, BILITOT, PROT, ALBUMIN in the last 168 hours. No results for input(s): LIPASE, AMYLASE in the last 168 hours. No results for input(s): AMMONIA in the last 168 hours.  ABG    Component Value Date/Time   PHART 7.40 03/02/2021 2000   PCO2ART 69 (HH) 03/02/2021 2000   PO2ART 137 (H) 03/02/2021 2000   HCO3 42.7 (H) 03/02/2021 2000   O2SAT 99.1 03/02/2021 2000      Coagulation Profile: No results for input(s): INR, PROTIME in the last 168 hours.  Cardiac  Enzymes: No results for input(s): CKTOTAL, CKMB, CKMBINDEX, TROPONINI in the last 168 hours.  HbA1C: Hgb A1c MFr Bld  Date/Time Value Ref Range Status  03/02/2021 04:52 AM 5.9 (H) 4.8 - 5.6 % Final    Comment:    (NOTE)         Prediabetes: 5.7 - 6.4         Diabetes: >6.4         Glycemic control for adults with diabetes: <7.0     CBG: Recent Labs  Lab 04/02/21 1207 04/02/21 1551 04/02/21 1948 04/03/21 0004 04/03/21 0346  GLUCAP 165* 152* 263* 225* 135*     Allergies Allergies  Allergen Reactions   Divalproex Sodium Other (See Comments)    Elevated liver enzymes   Valproic Acid     unknown       Ottie Glazier, M.D.  Pulmonary & [place] -  [place]

## 2021-04-03 NOTE — Consult Note (Signed)
Eddie Chapman, Eddie Chapman [id number] 10-Jul-1961 Fritzi Mandes, MD  Reason for Consult: Change trach tube  HPI: The patient is a 60 year old morbidly obese male with acute on chronic hypoxic and hypercapnic respiratory failure.  History includes systolic congestive heart failure that is chronic, pulmonary hypertension, obstructive sleep apnea, chronic hypoxia with respiratory failure, obesity hypoventilation syndrome and thoracic restriction, hormonal dysfunction with hypotestosteronism, dyslipidemia, metabolic syndrome, chronic subarachnoid hemorrhage, history of traumatic thoracic spinal injury, and depression.   He had a tracheostomy done on 03/07/2021 to help him wean off of the ventilator.  He has now been weaned down to trach collar and using a speaking valve some.  He has not needed the cuff on his trach tube but he does need to continue to use a trach tube more long-term.  He is seen today for changing out his trach tube to a cuffless tube.  Allergies:  Allergies  Allergen Reactions   Divalproex Sodium Other (See Comments)    Elevated liver enzymes   Valproic Acid     unknown    ROS: Review of systems normal other than 12 systems except per HPI.  PMH:  Past Medical History:  Diagnosis Date   Adenomatous colon polyp    Depression    Ear drum perforation    left x2    GERD (gastroesophageal reflux disease)    Hyperlipidemia    Hypertension    T8 vertebral fracture (HCC)     FH:  Family History  Problem Relation Age of Onset   Cancer Mother    Varicose Veins Mother     SH:  Social History   Socioeconomic History   Marital status: Single    Spouse name: Not on file   Number of children: Not on file   Years of education: Not on file   Highest education level: Not on file  Occupational History   Not on file  Tobacco Use   Smoking status: Never   Smokeless tobacco: Never  Vaping Use   Vaping Use: Never used  Substance and Sexual Activity   Alcohol use: Never   Drug use: Never    Sexual activity: Not Currently    Partners: Female    Birth control/protection: None  Other Topics Concern   Not on file  Social History Narrative   Not on file   Social Determinants of Health   Financial Resource Strain: Not on file  Food Insecurity: Not on file  Transportation Needs: Not on file  Physical Activity: Not on file  Stress: Not on file  Social Connections: Not on file  Intimate Partner Violence: Not on file    PSH:  Past Surgical History:  Procedure Laterality Date   CARPAL TUNNEL RELEASE     left hand   COLONOSCOPY  09/18/2009, 09/15/2014   COLONOSCOPY WITH PROPOFOL N/A 12/29/2017   Procedure: COLONOSCOPY WITH PROPOFOL;  Surgeon: Manya Silvas, MD;  Location: [facility] ENDOSCOPY;  Service: Endoscopy;  Laterality: N/A;   EYE SURGERY     FRACTURE SURGERY     HERNIA REPAIR     LIPOMA EXCISION     SPINE SURGERY     TRACHEOSTOMY TUBE PLACEMENT N/A 03/07/2021   Procedure: TRACHEOSTOMY;  Surgeon: Margaretha Sheffield, MD;  Location: [facility] ORS;  Service: ENT;  Laterality: N/A;    Physical  Exam: The patient is awake and alert very cooperative.  He has a 6 Shiley XLT proximal length tube in place with cuff deflated.  He has a speaking valve on and can  communicate very well.  He is currently on a pured food diet with thickened liquids.  His trach stoma site is well-healed and his neck is not swollen.  The trach tube was lying a little loosely around the neck.  The trach tube was changed out without difficulty.  It sits in a good position on his neck now and tolerates the trach tube well along with the speaking valve.  He is not short of breath.   A/P: Patient now has a 6 Shiley XLT proximally to begin that is cuffless.  I have instructed him to make sure he keeps it snug around his neck so there is no chance that this will slide out on him.  He is tolerating a speaking valve very well and can go home with that.  He can take the speaking valve off at nighttime and use just a small  humidifier on the tip of the trach tube to keep his air moisturized so we will not dry out his lungs.  When ever he gets discharged to home he will need to have his trach tube change on a regular basis.  He can come to the office and get it changed every 2 months.  He will need to have trach tube supplied to them at home and bring them to the office for changing.   Elon Alas Lissette Schenk 04/03/2021 5:53 PM

## 2021-04-03 NOTE — Progress Notes (Signed)
Mobility Specialist - Progress Note   04/03/21 1300  Mobility  Activity Transferred:  Chair to bed  Level of Assistance Moderate assist, patient does 50-74%  Assistive Device Front wheel walker  Distance Ambulated (ft) 3 ft  Mobility Ambulated with assistance in room  Mobility Response Tolerated well  Mobility performed by Mobility specialist  $Mobility charge 1 Mobility    Assisted pt with transfer from recliner-bed with RN assistance. ModA for STS to RW. Cued for hand placement to stand and sequencing. Pt returned supine with minA for LE support. HOB elevated and alarm set.    Kathee Delton Mobility Specialist 04/03/21, 1:30 PM

## 2021-04-03 NOTE — Progress Notes (Addendum)
Occupational Therapy Re-Evaluation Patient Details Name: Eddie Chapman MRN: [id number] DOB: [birth year] Today's Date: 04/03/2021    History of Present Illness Eddie Chapman is a 60 y.o. with a complex medical history including systolic CHF chronically, pulmonary hypertension, obstructive sleep apnea, chronic hypoxemia with respiratory failure, obesity hypoventilation syndrome with thoracic restriction, hormonal dysfunction with hypotestosteronism, dyslipidemia, metabolic syndrome, previous subarachnoid hemorrhage, history of traumatic thoracic spinal injury, & depression. He presented to [facility] ED on 02/23/21 with c/o of confusion & SOB for at least 3 days. Pt was admitted to CCU for ARF. he failed extubation and had trach placed on 03/07/21.   Clinical Impression   Chart reviewed, pt greeted in bed, reports he has just returned to bed following seated in chair for a few hours. Pt is agreeable to tx session, however requires frequent vcs throughout for sustained attention to ADL tasks. Pt also presents with increased fatigue throughout session, affecting ADL performance. Pt performs supine>seated at edge of bed with MOD A, use of bed rails, step by step vcs. Pt requires CGA for washing face at EOB, step by step cues required throughout. Pt sustains seated at edge of bed for ~15 minutes, vital signs monitored throughout. As stated previously, overall performance impacted by fatigue on this date following PT and sitting in the bedside chair. Pt does appear to be progressing towards goal acquisition. OT continue to recommend discharge to CIR to address functional deficits.     Follow Up Recommendations  CIR    Equipment Recommendations  Other (comment) (as per next level of care)    Recommendations for Other Services       Precautions / Restrictions Precautions Precautions: Fall Precaution Comments: trach collar, PMV Restrictions Weight Bearing Restrictions: No      Mobility Bed Mobility Overal  bed mobility: Needs Assistance Bed Mobility: Supine to Sit     Supine to sit: Mod assist;HOB elevated Sit to supine: Mod assist   General bed mobility comments: verbal cues required for technique, safety    Transfers                 General transfer comment: Increased fatigue noted during tx session- STS not attempted due to decresed SPO2    Balance Overall balance assessment: Needs assistance Sitting-balance support: Feet supported Sitting balance-Leahy Scale: Good Sitting balance - Comments: CGA at edge of bed Postural control: Posterior lean                                 ADL either performed or assessed with clinical judgement   ADL Overall ADL's : Needs assistance/impaired     Grooming: Wash/dry face;Min guard Grooming Details (indicate cue type and reason): at edge of bed                     Toileting- Clothing Manipulation and Hygiene: Maximal assistance;Sitting/lateral lean Toileting - Clothing Manipulation Details (indicate cue type and reason): MAX A for urinal seated at EOB +2       General ADL Comments: Pt tolerates seated at EOB for ~15 minutes for toileting, grooming tasks                         Pertinent Vitals/Pain Pain Assessment: No/denies pain     Communication     Cognition Arousal/Alertness: Awake/alert Behavior During Therapy: WFL for tasks assessed/performed Overall Cognitive Status: Impaired/Different from baseline  Area of Impairment: Safety/judgement;Attention                   Current Attention Level: Focused Memory: Decreased short-term memory Following Commands: Follows one step commands consistently Safety/Judgement: Decreased awareness of deficits;Decreased awareness of safety   Problem Solving: Requires verbal cues;Requires tactile cues General Comments: Pt was oriented throughout session, required vcs for technique for ADL completion, sustained attention to task at hand; decreased  insight into deficits   General Comments  SPO2 92 at end of EOB tasks, improved with removal of pmv, return to bed        OT Treatment/Interventions:      OT Goals(Current goals can be found in the care plan section) Acute Rehab OT Goals Patient Stated Goal: I want to go home OT Goal Formulation: With patient Time For Goal Achievement: 04/17/21 Potential to Achieve Goals: Good  OT Frequency: Min 3X/week                     AM-PAC OT "6 Clicks" Daily Activity     Outcome Measure Help from another person eating meals?: A Little Help from another person taking care of personal grooming?: A Little Help from another person toileting, which includes using toliet, bedpan, or urinal?: A Lot Help from another person bathing (including washing, rinsing, drying)?: A Lot Help from another person to put on and taking off regular upper body clothing?: A Little Help from another person to put on and taking off regular lower body clothing?: A Lot 6 Click Score: 15   End of Session Nurse Communication: Mobility status;Other (comment) (vital signs)  Activity Tolerance: Patient limited by fatigue Patient left: in bed;with call bell/phone within reach;with bed alarm set;with family/visitor present  OT Visit Diagnosis: Other abnormalities of gait and mobility (R26.89);Muscle weakness (generalized) (M62.81);Other symptoms and signs involving cognitive function                Time: 1400-1424 OT Time Calculation (min): 24 min Charges:  OT General Charges $OT Visit: 1 Visit OT Evaluation $OT Re-eval: 1 Re-eval OT Treatments $Self Care/Home Management : 23-37 mins  Shanon Payor, OTD OTR/L  04/03/21, 3:47 PM

## 2021-04-03 NOTE — TOC Progression Note (Signed)
Transition of Care [facility]) - Progression Note    Patient Details  Name: Eddie Chapman MRN: [id number] Date of Birth: 1961/02/11  [facility] CM/SW Contact  Beverly Sessions, RN Phone Number: 04/03/2021, 3:57 PM  Clinical Narrative:     Lurline Idol to be changed to cuffless today.  If successful will follow up with patient tomorrow and revist the disposition of  inpatient rehab at [place].   It patient opts to go home it will be a barrier obtaining home health services due to patient's payor        Expected Discharge Plan and Services                                                 Social Determinants of Health (SDOH) Interventions    Readmission Risk Interventions No flowsheet data found.

## 2021-04-03 NOTE — Progress Notes (Signed)
Nutrition Follow-up  DOCUMENTATION CODES:   Morbid obesity  INTERVENTION:   Ensure Enlive po TID, each supplement provides 350 kcal and 20 grams of protein  Magic cup TID with meals, each supplement provides 290 kcal and 9 grams of protein  MVI po daily   Vitamin C 250mg  po BID  NUTRITION DIAGNOSIS:   Increased nutrient needs related to acute illness as evidenced by estimated needs.  GOAL:   Patient will meet greater than or equal to 90% of their needs -progressing   MONITOR:   PO intake, Supplement acceptance, Labs, Weight trends, Skin, I & O's, Diet advancement  ASSESSMENT:   60 y/o male with a h/o HTN, HLD, OSA, CHF, MDD, GERD, cerebral aneurysm and pulmonary HTN who is admitted with CAP, COPD exacerbation and encephalopathy.   RD working remotely.  Pt remains on a dysphagia 1/thin liquid diet. SLP following. Pt using PMSV. Per chart review, pt eating 100% of meals and is drinking all of his Ensure supplements. Refeed labs stable. Per chart, pt is fairly weight stable since admit. Pt with new wound on his back; RD will add vitamin C. Plan is for possible CIR at discharge.   Medications reviewed and include: lovenox, insulin, MVI, protonix, miralax, senokot  Labs reviewed: K 4.4 wnl, P 2.8 wnl, Mg 1.9 wnl Wbc- 10.9(H) Cbgs- 124, 135, 201 x 24 hrs  Diet Order:   Diet Order             DIET - DYS 1 Room service appropriate? Yes with Assist; Fluid consistency: Thin  Diet effective now                  EDUCATION NEEDS:   No education needs have been identified at this time  Skin:  Skin Assessment: Reviewed RN Assessment (back wound 2 cm x 1 cm)  Last BM:  7/4  Height:   Ht Readings from Last 1 Encounters:  03/22/21 5\' 10"  (1.778 m)    Weight:   Wt Readings from Last 1 Encounters:  04/03/21 (!) 147.2 kg    Ideal Body Weight:  75.5 kg  BMI:  Body mass index is 46.56 kg/m.  Estimated Nutritional Needs:   Kcal:  2500-2800kcal/day  Protein:  >125g/day   Fluid:  2.3-2.6L/day  Koleen Distance MS, RD, LDN Please refer to [facility] for RD and/or RD on-call/weekend/after hours pager

## 2021-04-03 NOTE — Progress Notes (Signed)
Eddie Chapman at [place] NAME: Eddie Chapman    MR#:  [id number]  DATE OF BIRTH:  1961-05-05  SUBJECTIVE:   Came in with acute hypoxic respiratory failure underwent intubation failed four week trial and now tracheostomy placed.  Currently on trach  with Nasal canula and sats stable able to communicate. Tolerating PO diet. Intermittent tachycardia REVIEW OF SYSTEMS:   Review of Systems  Constitutional:  Negative for chills, fever and weight loss.  HENT:  Negative for ear discharge, ear pain and nosebleeds.   Eyes:  Negative for blurred vision, pain and discharge.  Respiratory:  Negative for sputum production, shortness of breath, wheezing and stridor.   Cardiovascular:  Negative for chest pain, palpitations, orthopnea and PND.  Gastrointestinal:  Negative for abdominal pain, diarrhea, nausea and vomiting.  Genitourinary:  Negative for frequency and urgency.  Musculoskeletal:  Negative for back pain and joint pain.  Neurological:  Positive for weakness. Negative for sensory change, speech change and focal weakness.  Psychiatric/Behavioral:  Negative for depression and hallucinations. The patient is not nervous/anxious.   Tolerating Diet:yes Tolerating PT: CIR  DRUG ALLERGIES:   Allergies  Allergen Reactions   Divalproex Sodium Other (See Comments)    Elevated liver enzymes   Valproic Acid     unknown    VITALS:  Blood pressure 107/71, pulse (!) 104, temperature 98.1 F (36.7 C), resp. rate (!) 22, height 5\' 10"  (1.778 m), weight (!) 147.2 kg, SpO2 94 %.  PHYSICAL EXAMINATION:   Physical Exam  GENERAL:  60 y.o.-year-old patient lying in the bed with no acute distress. Morbidly obeseChronically ill appearing HEENT: Head atraumatic, normocephalic. Oropharynx and nasopharynx clear.  Trach with PMV + LUNGS: Normal breath sounds bilaterally, no wheezing, rales, rhonchi. No use of accessory muscles of respiration.  CARDIOVASCULAR: S1, S2  normal. No murmurs, rubs, or gallops. Tachy+ ABDOMEN: Soft, nontender, nondistended.  Abdominal obesity EXTREMITIES: No cyanosis, clubbing or edema b/l.    NEUROLOGIC: grossly nonfocal PSYCHIATRIC:  patient is alert and oriented x 3.  SKIN: No obvious rash, lesion, or ulcer.   LABORATORY PANEL:  CBC Recent Labs  Lab 03/28/21 0412  WBC 10.9*  HGB 12.0*  HCT 36.0*  PLT 190     Chemistries  Recent Labs  Lab 03/28/21 0412  NA 139  K 4.4  CL 99  CO2 33*  GLUCOSE 131*  BUN 20  CREATININE 0.67  CALCIUM 9.2  MG 1.9    Cardiac Enzymes No results for input(s): TROPONINI in the last 168 hours. RADIOLOGY:  No results found. ASSESSMENT AND PLAN:  60 yo morbidly obese male with acute on chronic hypoxic and hypercapnic respiratory failurehistory including systolic CHF chronically, pulmonary hypertension, obstructive sleep apnea, chronic hypoxemia with respiratory failure, obesity hypoventilation syndrome with thoracic restriction, hormonal dysfunction with hypotestosteronism, dyslipidemia, metabolic syndrome, previous subarachnoid hemorrhage, history of traumatic thoracic spinal injury, depression.   Acute on Chronic Hypoxic and Hypercapnic Respiratory Failure in the setting of  PNEUMONIA -Chronic respiratory failure with hypoxia now trach dependent -S/P tracheostomy on 03/07/21 by Dr Kathyrn Sheriff -- currently weaned off trach collar and sats more than 92% on 3 L nasal cannula --Dr Kathyrn Sheriff ENT will evaluate today for cuffless trach --Bronchodilators and budesonide nebs              ACINETOBACTER CALCOACETICUS/BAUMANNII COMPLEX PNEUMONIA Meets SIRS Criteria (Leukocytosis w/ WBC 14.5, HR  118, RR 26) ---pt was on IV cefepime for 7 days (STARTED 6/24--completed 7/1)  Septic Shock>>resolved Hypertension Tachycardia-chronic -Continuous cardiac monitoring -Maintain MAP >65 ---d/ced clonidine patch --cont Metoprolol 50 mg bid. Add cardizem 30 mg q6 hourly and if BP tolerates change to  po long acting   Hypernatremia>>resolved Mild Hypokalemia>>resolved -Avoid nephrotoxic agents as able -Replace electrolytes as indicated  Acute toxic metabolic encephalopathy>>resolved  Depression -Continue Abilify prn trazodone  -PT/OT consult--recommends CIR  morbid obesity with suspected sleep apnea --trach dependent   Procedures:Tracheostomy Family communication :none today. CODE STATUS: DNR DVT Prophylaxis :enoxaparin Level of care: Med-Surg Status is: Inpatient  Dispo: The patient is from: Home              Anticipated d/c is to:  TBD              Patient currently is improving and waiting for [facility] CIR evaluation. I have d/w CSW to consider [facility] CIR also   Difficult to place patient No   TOC for d/c planning--TOC to check with [facility] CIR evaluation status. To consider [facility] CIR also     TOTAL TIME TAKING CARE OF THIS PATIENT: 25 minutes.  >50% time spent on counselling and coordination of care  Note: This dictation was prepared with Dragon dictation along with smaller phrase technology. Any transcriptional errors that result from this process are unintentional.  Eddie Chapman M.D    [facility]   CC: Primary care physician; Eddie Hector, MD [name], male   DOB: 1961/02/23, 60 y.o.   MRN: [id number]

## 2021-04-03 NOTE — Progress Notes (Signed)
Physical Therapy Treatment Patient Details Name: Eddie Chapman MRN: [id number] DOB: 07/31/1961 Today's Date: 04/03/2021    History of Present Illness Eddie Chapman is a 60 y.o. with a complex medical history including systolic CHF chronically, pulmonary hypertension, obstructive sleep apnea, chronic hypoxemia with respiratory failure, obesity hypoventilation syndrome with thoracic restriction, hormonal dysfunction with hypotestosteronism, dyslipidemia, metabolic syndrome, previous subarachnoid hemorrhage, history of traumatic thoracic spinal injury, & depression. He presented to [facility] ED on 02/23/21 with c/o of confusion & SOB for at least 3 days. Pt was admitted to CCU for ARF. he failed extubation and had trach placed on 03/07/21.    PT Comments    Pt was long sitting in bed agreeable to PT session. He is alert and very talkative. Had to request pt focus on desired task requested. He was able to exit bed R side of bed with min-mod assist. Stood to RW and ambulated 2 bouts of ~ 50 ft. Prolonged rest between trials 2/2 to SOB. Recovers quickly with seated best. HR between 103-122 BPM during session. He overall tolerated session well and does continue to progress with PT. Currently recommending DC to IP rehab to address deficits while maximizing independence with ADLs. Pt lives with supportive long time girlfriend    Follow Up Recommendations  CIR     Equipment Recommendations  None recommended by PT    Recommendations for Other Services       Precautions / Restrictions Precautions Precautions: Fall Precaution Comments: trach collar Restrictions Weight Bearing Restrictions: No    Mobility  Bed Mobility Overal bed mobility: Needs Assistance Bed Mobility: Supine to Sit Rolling: Min assist;Mod assist   Supine to sit: Min assist;Mod assist          Transfers Overall transfer level: Needs assistance Equipment used: Rolling walker (2 wheeled) Transfers: Sit to/from Stand Sit to  Stand: Min assist;Mod assist         General transfer comment: pt stood 2 x EOB and 2 x form recliner throughout session.  Ambulation/Gait Ambulation/Gait assistance: Min guard;+2 safety/equipment (chair follow) Gait Distance (Feet): 50 Feet Assistive device: Rolling walker (2 wheeled)   Gait velocity: decreased   General Gait Details: 2 x ~ 50 ft    Balance Overall balance assessment: Needs assistance Sitting-balance support: Feet supported;Bilateral upper extremity supported Sitting balance-Leahy Scale: Good     Standing balance support: Bilateral upper extremity supported;During functional activity Standing balance-Leahy Scale: Fair Standing balance comment: reliant on UE support      Cognition Arousal/Alertness: Awake/alert Behavior During Therapy: WFL for tasks assessed/performed Overall Cognitive Status: Within Functional Limits for tasks assessed Area of Impairment: Safety/judgement      Orientation Level: Disoriented to;Situation Current Attention Level: Focused Memory: Decreased short-term memory;Decreased recall of precautions Following Commands: Follows one step commands consistently Safety/Judgement: Decreased awareness of safety;Decreased awareness of deficits Awareness: Anticipatory;Emergent Problem Solving: Requires verbal cues;Requires tactile cues General Comments: Pt was A and oriented x 3. Trach had PMV throughout session with pt on 3 L o2. no desaturation throughout sesison.             Pertinent Vitals/Pain Pain Assessment: No/denies pain     PT Goals (current goals can now be found in the care plan section) Acute Rehab PT Goals Patient Stated Goal: I want to get home ASAP Progress towards PT goals: Progressing toward goals    Frequency    Min 2X/week      PT Plan Current plan remains appropriate    Co-evaluation  PT goals addressed during session: Mobility/safety with mobility;Balance;Proper use of DME;Strengthening/ROM         AM-PAC PT "6 Clicks" Mobility   Outcome Measure  Help needed turning from your back to your side while in a flat bed without using bedrails?: A Little Help needed moving from lying on your back to sitting on the side of a flat bed without using bedrails?: A Little Help needed moving to and from a bed to a chair (including a wheelchair)?: A Little Help needed standing up from a chair using your arms (e.g., wheelchair or bedside chair)?: A Little Help needed to walk in hospital room?: A Little Help needed climbing 3-5 steps with a railing? : A Lot 6 Click Score: 17    End of Session Equipment Utilized During Treatment: Oxygen;Gait belt Activity Tolerance: Patient limited by fatigue Patient left: in bed;with call bell/phone within reach Nurse Communication: Mobility status PT Visit Diagnosis: Muscle weakness (generalized) (M62.81);Difficulty in walking, not elsewhere classified (R26.2);Unsteadiness on feet (R26.81)     Time: 2820-6015 PT Time Calculation (min) (ACUTE ONLY): 24 min  Charges:  $Gait Training: 8-22 mins $Therapeutic Activity: 8-22 mins                     Julaine Fusi PTA 04/03/21, 10:52 AM

## 2021-04-03 NOTE — Progress Notes (Signed)
   04/03/21 0458  Assess: MEWS Score  Temp 98.2 F (36.8 C)  BP 125/69  Pulse Rate (!) 120  Resp (!) 34  SpO2 93 %  O2 Device Nasal Cannula  Assess: MEWS Score  MEWS Temp 0  MEWS Systolic 0  MEWS Pulse 2  MEWS RR 2  MEWS LOC 0  MEWS Score 4  MEWS Score Color Red  Assess: SIRS CRITERIA  SIRS Temperature  0  SIRS Pulse 1  SIRS Respirations  1  SIRS WBC 0  SIRS Score Sum  2   Pt tachycardic in 120s again and tachyneic (30s). Oxygen sat at 92% on oxygen at 6lpm/[place]. Pt denies SOB. Lung sounds diminished. Trache suctioned 1x. RT, oncall provider and rapid response nurse informed. See orders per Dr. Aletha Halim cont to monitor the pt.

## 2021-04-03 NOTE — Progress Notes (Signed)
Speech Language Pathology Treatment: Nada Boozer Speaking valve  Patient Details Name: Eddie Chapman MRN: [id number] DOB: December 15, 1960 Today's Date: 04/03/2021 Time: 1240-1310 SLP Time Calculation (min) (ACUTE ONLY): 30 min  Assessment / Plan / Recommendation Clinical Impression  Pt seen for PMV tx session for toleration of PMV wear/use and education on placement of the PMV w/ pt's GF, [name], present in room. Pt remains on a dysphagia 1 w/ thin liquids, modified for safer intake at this time. Trials to upgrade diet consistency tomorrow as pt had just finished eating Lunch meal and appeared drowsy this session. Pt continues on weaning and improving his Pulmonary status per RT, notes. Pt is wearing his PMV during the day and now on [place] O2 support vs the TC O2 support per RT, notes. Pt was alert, talkative during session; mild Cognitive impairment, suspect related to Baseline issues s/p TBI in 2016(per family). He requires verbal cues intermittently to follow through w/ general instructions/precautions; min decreased insight of his medical situation and GOC noted during his conversations.   Pt was wearing the PMV at baseline upon entering room. Pt and GF stated he wears it "most of the day" now. Pt's respiratory effort appeared min increased w/ any exertion including Talking and Moving to sit more upright in chair -- pt stated he was "tired" and wanted to go back to bed after sitting up for Lunch and walking w/ therapist earlier. O2 sats 97%. Cuff deflated at baseline w/ frequent use of PMV during the day. ENT has been consulted to change trach to Cuffless trach tonight/tomorrow(?). No overt discomfort noted in his respiratory effort during wear and the conversation -- pt stated it felt "fine" to wear/talk w/. No increased effort noted in respirations during the expiratory phase; no overt use of accessary muscles or distress was noted in his breathing pattern from his Baseline. PMV was removed once pt returned to  bed d/t closing his eyes often. Pt was drowsy and could not fully participate in learning placement/removal of his PMV this session. Education was completed w/ pt's GF.   Pt appears to adequately tolerate PMV placement w/out overt, gross respiratory discomfort or distress -- unsure of pt's new Baseline Pulmonary status post this illness; ANS remained adequately stable at his Baseline during wear/use. Suspect Shiley trach size, #6, is beneficial for comfortable PMV wear/use.    Practiced w/ pt's GF the placement and removal of the PMV -- hands-on practice Much education was given on PMV use/wear, MUST have Cuff deflation for PMV wear while a Cuffed trach, checking and removing the air from the balloon b/f placing, placing/removing the PMV, and care of the PMV. Discussed that it MUST be worn for all eating/drinking; and can be work w/ Therapies(OT, PT). Must NOT be worn when sleeping. Encouraged Rest Breaks at times during the day. Precautions posted at bedside and in chart.   Pt remains w/ a Cuffed trach during his wean at this time. Sticker placed on Cuff line, and in room. NSG made aware. MD updated. ST services will continue to monitor for any further education and needs while admitted.    HPI HPI: Pt is a 60 yo M with a complex medical history including hx of cerebral aneurysm, PVD, HFrEF, pulmonary htn, morbid Obesity, OSA, GERD, hx of TBI, w/ recent admit in 09/2020 when he was found Hypoxic and unresponsive. Pt has slower mentation and decision making -- this is Baseline s/p accident when he fell off a roof in 2016 per GF  and Father.  Pt was admitted to [facility] ED on 02/23/21 with c/o of confusion & SOB for at least 3 days. Pt was admitted to CCU for ARF. He was orally intubated on 02/28/2021; tracheostomy on 03/07/21 w/ downsize to a #6 shiley XLT on 03/17/2021; trach collar wean initiated/tolerated on 03/20/2021. Current NGT in place for enteral feedings.      SLP Plan  Continue with current plan of  care       Recommendations  Diet recommendations: Dysphagia 1 (puree);Thin liquid (trials to upgrade tomorrow) Liquids provided via: Cup;Straw (monitor) Medication Administration: Crushed with puree (vs Whole in puree) Supervision: Patient able to self feed;Staff to assist with self feeding;Intermittent supervision to cue for compensatory strategies Compensations: Minimize environmental distractions;Slow rate;Small sips/bites;Lingual sweep for clearance of pocketing;Follow solids with liquid (PMV placed for all oral intake) Postural Changes and/or Swallow Maneuvers: Out of bed for meals;Seated upright 90 degrees;Upright 30-60 min after meal (REFLUX precautions)      Patient may use Passy-Muir Speech Valve: During all therapies with supervision;During all waking hours (remove during sleep);During PO intake/meals PMSV Supervision: Intermittent MD: Please consider changing trach tube to : Other (comment) (Cuffless trach -- NSG reported ENT would be coming to change out the trach tomorrow(?))         General recommendations:  (PT/OT following) Oral Care Recommendations: Oral care BID;Oral care before and after PO;Staff/trained caregiver to provide oral care (pt to assist) Follow up Recommendations: Inpatient Rehab SLP Visit Diagnosis: Aphonia (R49.1) (tracheostomy) Plan: Continue with current plan of care       GO                  Orinda Kenner, [place], [place] [facility] Re[facility] Services [phone] [facility] 04/03/2021, 2:39 PM

## 2021-04-04 ENCOUNTER — Inpatient Hospital Stay: Payer: PPO

## 2021-04-04 DIAGNOSIS — F32A Depression, unspecified: Secondary | ICD-10-CM | POA: Diagnosis not present

## 2021-04-04 DIAGNOSIS — E662 Morbid (severe) obesity with alveolar hypoventilation: Secondary | ICD-10-CM | POA: Diagnosis not present

## 2021-04-04 DIAGNOSIS — J9601 Acute respiratory failure with hypoxia: Secondary | ICD-10-CM | POA: Diagnosis not present

## 2021-04-04 DIAGNOSIS — I5023 Acute on chronic systolic (congestive) heart failure: Secondary | ICD-10-CM | POA: Diagnosis not present

## 2021-04-04 DIAGNOSIS — J9602 Acute respiratory failure with hypercapnia: Secondary | ICD-10-CM | POA: Diagnosis not present

## 2021-04-04 LAB — BLOOD GAS, ARTERIAL
Acid-Base Excess: 22.1 mmol/L — ABNORMAL HIGH (ref 0.0–2.0)
Acid-Base Excess: 23.8 mmol/L — ABNORMAL HIGH (ref 0.0–2.0)
Bicarbonate: 53 mmol/L — ABNORMAL HIGH (ref 20.0–28.0)
Bicarbonate: 53.9 mmol/L — ABNORMAL HIGH (ref 20.0–28.0)
FIO2: 0.36
FIO2: 0.5
MECHVT: 500 mL
Mechanical Rate: 16
O2 Saturation: 90.6 %
O2 Saturation: 96.9 %
PEEP: 10 cmH2O
Patient temperature: 37
Patient temperature: 37
RATE: 16 resp/min
pCO2 arterial: 96 mmHg (ref 32.0–48.0)
pCO2 arterial: 87 mmHg (ref 32.0–48.0)
pH, Arterial: 7.35 (ref 7.350–7.450)
pH, Arterial: 7.4 (ref 7.350–7.450)
pO2, Arterial: 63 mmHg — ABNORMAL LOW (ref 83.0–108.0)
pO2, Arterial: 90 mmHg (ref 83.0–108.0)

## 2021-04-04 LAB — CBC WITH DIFFERENTIAL/PLATELET
Abs Immature Granulocytes: 0.36 10*3/uL — ABNORMAL HIGH (ref 0.00–0.07)
Basophils Absolute: 0.1 10*3/uL (ref 0.0–0.1)
Basophils Relative: 1 %
Eosinophils Absolute: 0.1 10*3/uL (ref 0.0–0.5)
Eosinophils Relative: 1 %
HCT: 38.9 % — ABNORMAL LOW (ref 39.0–52.0)
Hemoglobin: 12.2 g/dL — ABNORMAL LOW (ref 13.0–17.0)
Immature Granulocytes: 3 %
Lymphocytes Relative: 9 %
Lymphs Abs: 1.1 10*3/uL (ref 0.7–4.0)
MCH: 32 pg (ref 26.0–34.0)
MCHC: 31.4 g/dL (ref 30.0–36.0)
MCV: 102.1 fL — ABNORMAL HIGH (ref 80.0–100.0)
Monocytes Absolute: 0.9 10*3/uL (ref 0.1–1.0)
Monocytes Relative: 7 %
Neutro Abs: 10.1 10*3/uL — ABNORMAL HIGH (ref 1.7–7.7)
Neutrophils Relative %: 79 %
Platelets: 188 10*3/uL (ref 150–400)
RBC: 3.81 MIL/uL — ABNORMAL LOW (ref 4.22–5.81)
RDW: 13.6 % (ref 11.5–15.5)
WBC: 12.6 10*3/uL — ABNORMAL HIGH (ref 4.0–10.5)
nRBC: 0.2 % (ref 0.0–0.2)

## 2021-04-04 LAB — BASIC METABOLIC PANEL
Anion gap: 6 (ref 5–15)
BUN: 20 mg/dL (ref 8–23)
CO2: 43 mmol/L — ABNORMAL HIGH (ref 22–32)
Calcium: 9.3 mg/dL (ref 8.9–10.3)
Chloride: 91 mmol/L — ABNORMAL LOW (ref 98–111)
Creatinine, Ser: 0.7 mg/dL (ref 0.61–1.24)
GFR, Estimated: >60 mL/min (ref >60)
Glucose, Bld: 188 mg/dL — ABNORMAL HIGH (ref 70–99)
Potassium: 4.9 mmol/L (ref 3.5–5.1)
Sodium: 140 mmol/L (ref 135–145)

## 2021-04-04 LAB — GLUCOSE, CAPILLARY
Glucose-Capillary: 109 mg/dL — ABNORMAL HIGH (ref 70–99)
Glucose-Capillary: 145 mg/dL — ABNORMAL HIGH (ref 70–99)
Glucose-Capillary: 152 mg/dL — ABNORMAL HIGH (ref 70–99)
Glucose-Capillary: 167 mg/dL — ABNORMAL HIGH (ref 70–99)
Glucose-Capillary: 194 mg/dL — ABNORMAL HIGH (ref 70–99)
Glucose-Capillary: 207 mg/dL — ABNORMAL HIGH (ref 70–99)
Glucose-Capillary: 90 mg/dL (ref 70–99)

## 2021-04-04 MED ORDER — CHLORHEXIDINE GLUCONATE CLOTH 2 % EX PADS
6.0000 | MEDICATED_PAD | Freq: Every day | CUTANEOUS | Status: DC
  Administered 2021-04-04 – 2021-04-05 (×2): 6 via TOPICAL

## 2021-04-04 MED ORDER — MIDAZOLAM HCL 2 MG/2ML IJ SOLN: 2.0000 mg | INTRAMUSCULAR | Status: DC | PRN

## 2021-04-04 MED ORDER — DOCUSATE SODIUM 50 MG/5ML PO LIQD: 100.0000 mg | Freq: Two times a day (BID) | ORAL | Status: DC

## 2021-04-04 MED ORDER — FENTANYL CITRATE (PF) 100 MCG/2ML IJ SOLN: 50.0000 ug | INTRAMUSCULAR | Status: DC | PRN

## 2021-04-04 MED ORDER — MIDAZOLAM HCL 2 MG/2ML IJ SOLN
2.0000 mg | INTRAMUSCULAR | Status: DC | PRN
  Administered 2021-04-05: 2 mg via INTRAVENOUS
  Filled 2021-04-04: qty 2

## 2021-04-04 NOTE — Progress Notes (Addendum)
Physical Therapy Discharge Patient Details Name: Eddie Chapman MRN: [id number] DOB: [birth year] Today's Date: 04/04/2021 Time:  -     Patient discharged from PT services secondary to medical decline - will need to re-order PT to resume therapy services.  Please see latest therapy progress note for current level of functioning and progress toward goals. Pt has had decline in status with transfer to higher level of care. Re-consult when appropriate.    Willette Pa 04/04/2021, 11:04 AM   The note, response to treatment and overall treatment plan has been reviewed and this clinician agrees with the information provided.  Tera Helper, PT, DPT, NCS 04/04/21, 9:44 PM

## 2021-04-04 NOTE — Consult Note (Addendum)
NAME:  Eddie Chapman, MRN:  [id number], DOB:  1960/12/24, LOS: 12 ADMISSION DATE:  02/23/2021, CONSULTATION DATE:  04/04/2021 REFERRING MD:  Dr. Leslye Peer, CHIEF COMPLAINT:  Acute Hypoxic & Hypercapnic Respiratory Failure   Brief Pt Description / Synopsis:  60 yo morbidly obese male with acute on chronic hypoxic and hypercapnic respiratory failure due to [place] and AECOPD, along with severe toxic metabolic encephalopathy.  Failure to wean from vent requiring Tracheostomy on 03/07/2021.  History of Present Illness:  Patient has a complex comorbid history including systolic CHF chronically, pulmonary hypertension, obstructive sleep apnea, chronic hypoxemia with respiratory failure, obesity hypoventilation syndrome with thoracic restriction, hormonal dysfunction with hypotestosteronism, dyslipidemia, metabolic syndrome, previous subarachnoid hemorrhage, history of traumatic thoracic spinal injury, depression.  Patient was admitted in January to the intensive care unit with altered mental status acute hypoxemic and hypercapnic respiratory failure with metabolic encephalopathy and left lower lobe severe pneumonia.  Throughout hospitalization he was noted to have lethargy with hypercapnic encephalopathy and became unresponsive a few times even postextubation while on medical floor.  Patient has never smoked in the past.   On this admission he was poorly responsive with hypercapnic hypoxemic respiratory failure  Pertinent  Medical History  COPD OSA Morbid Obesity Hypertension Hyperlipidemia GERD Depression T8 Vertebral Fracture  Micro Data:  5/27: SARS-CoV-2 and influenza PCR>> negative 5/27: Blood culture x2>> no growth 5/27: Urine>> no growth 5/27: MRSA PCR>> negative 5/28: Sputum>> normal respiratory flora 5/30: Respiratory viral panel>> negative 5/31: SPUTUM CULTURE >> ACINETOBACTER CALCOACETICUS/BAUMANNII COMPLEX  6/13: Tracheal aspirate>>ACINETOBACTER  CALCOACETICUS/BAUMANNII COMPLEX 6/24: Blood culture x2>>no growth 6/24: Tracheal aspirate>>ACINETOBACTER CALCOACETICUS/BAUMANNII COMPLEX   Antimicrobials:  Azithromycin 5/27>>5/29 Ceftriaxone 5/27>>5/29 Vancomycin 5/29>>5/30, restarted 6/24>>6/27 Zosyn 5/29>>6/1 Unasyn 6/1>>6/10 Cefepime 6/24>>6/30  Significant Hospital Events: Including procedures, antibiotic start and stop dates in addition to other pertinent events   02/24/21- Patient responded well to mechanical ventilation with improvement in hypercapnia on ABG this am.  Seems that he had THC induced apnea/hypopnea based on laboratory testing.  There is bibasilar atelectasis induced severe hypoxemia, recruitment Metaneb therapy has been ordered. Plan to repeat CXR this afternoon with SBT today.  Reviewed plan with RN and RT today.     02/25/21- patient was unable to pass SBT today. He was able to come off propofol with RASS-0 and is weaned down on levophed.     02/26/21- patient continues to require levophed, he has been difficult to wean. I have asked ID for consultation due to concern for sepsis possibly due to CAP. Unable to perform SBT today. Family at bedside.   02/27/21- patient on PRVC with heavy secretions, difficulty weaning with high narcotic tolerance. Patient had suicide attempt with requirement for psych.   6/1 failing to wean from vent, DNR status 6/2 failed to wean from vent, severe hypoxia 6/3 severe hypoxia, failure to wean from vent, needs [facility], ENT consulted 6/4 failure to wean from  6/6 failure to wean from vent, plan for [facility] 6/7 failure to wean from vent 6/8 plan for [facility] TODAY 6/9 left foot cyanosis, vasc surgery consulted 6/10: Agitation overnight, added oxycodone, valium, seroquel.  SBT in progress.  Working with PT 6/11: Less agitated, however, did not tolerate SBT today 6/12: Dilaudid being weaned off, less agitated.  Can actually speak with trach.  Tolerating SBT 03/12/21- patient febrile, Trache  aspirate sent for viral workup. Patient with anasarca on examination. Adequate UOP.  On PRVC 35%, GCS3T. 03/13/21- TCC having difficulty finding CIR placement.  There is  RLE swelling and hypothermia and DVT study was ordered, he is with severe anxiety during SBT today and we reviewed MAR with addition of Abilify due to no QTC effect.   03/14/21- patient is awake, he is attempting again for trache collar trial today.  His secretions are improved, he is vocalizing and trying to write for communication. 03/15/21- patient is alert he communicates via gesturing and attempts to vocalize.  Still in withdrawal and on precedex but calm and appropriate.  Plan is to wean precedex and get to PT/OT. 03/16/21- patient is improved mentation, plan for PMV and SLP with ongoing PNMR transfer for recovery.  He will need psych eval at some point due to suicide attempt 03/17/21- patient is speaking via gesturing and writing.  Trach changed to XLT 6.  Ongoing plan for CIR.   03/18/21- patient continue to improve, he will be able to join PT/OT with plan to move to [facility] inpatient rehab asap. 03/19/21- SBT vs Trach collar trials during day, rest on vent at night.  Lasix x1 today 03/20/21-Pts current vent settings SBT 15/5 and tolerating well will attempt to trach collar 03/21/21- Tolerating Trach collar trials, work with PT/OT 03/22/21- Continues to tolerate Trach collar trial during day. Speech consulted for PMV. New Leukocytosis (WBC 15.5) and low grade fever.  CXR without new infiltrate. No foley, and denies dysuria.  Plan to remove PICC line. 03/23/21- Persistent Low grade fever (t max 100.6), WBC slightly improved to 14.5. Meets SIRS Criteria ~Sepsis workup in progress, placed on empiric Cefepime & Vancomycin, tolerating TC trials during day.  Holding Lasix today due to soft BP and worsening Creatinine 6/26 started TCT 6/27 remains on TCT 6/28 remains on TCT, toerating oral feeds 04/03/2021-patient is interactive and is able to  vocalize with Passy-Muir valve.  He gets up to go to the bathroom on his own and states that he is confident on his feet however is not back to his baseline strength level.  Have discussed his care plan with Dr. Fritzi Mandes this morning and there is plan for possible rehab however patient has been improving so well that there is also an option for discharge home with home PT which is what he would prefer.  His secretions were good he is requiring 3 L of oxygen and 4 L with exertion.  This morning during nebulizer therapy we did increase it to 6 L transiently.  Ear nose and throat consultation has been placed for downgrading of trach to cuffless. 04/04/21: Pt lethargic, found to be hypercapnic on ABG.  Transferred to ICU with [facility] changed to Cuffed, placed on Vent  Interim History / Subjective:  -This morning with AMS (lethargy), found to have hypercapnia (reportedly received a dose of Trazodone around midnight) -Transferred to ICU for placement on vent -Cuffless [place] changed to Cuffed, now on vent -Will arouse to voice and follow commands -Afebrile, hemodynamically stable (BP soft)  Objective   Blood pressure 104/78, pulse 75, temperature 98.9 F (37.2 C), temperature source Oral, resp. rate 16, height _0  (1.778 m), weight (!) 147.2 kg, SpO2 95 %.    FiO2 (%):  [35 %-40 %] 35 %   Intake/Output Summary (Last 24 hours) at 04/04/2021 1033 Last data filed at 04/03/2021 2300 Gross per 24 hour  Intake 720 ml  Output 100 ml  Net 620 ml   Filed Weights   03/31/21 0433 04/02/21 0500 04/03/21 0256  Weight: (!) 136.2 kg (!) 136.1 kg (!) 147.2 kg    Examination:  General: Acute on chronically ill-appearing male, sitting in bed, placed on ventilator, no acute distress HENT: Atraumatic, normocephalic, neck supple, no JVD Lungs: Coarse breath sounds bilaterally, no wheezing or rhonchi auscultated, synchronous with the vent, even Cardiovascular: Regular rate and rhythm, S1-S2, no murmurs, rubs,  gallops, 2+ distal pulses Abdomen: Obese, soft, nontender, nondistended, no guarding rebound tenderness, bowel sounds positive x4 Extremities: Normal bulk and tone, no deformities, no edema Neuro: Lethargic, arouses to voice, moves all extremities to command and follows simple commands, no focal deficits, pupils PERRLA GU: External catheter in place  Resolved Hospital Problem list     Assessment & Plan:   Acute on chronic hypoxic and hypercapnic respiratory failure in the setting of AECOPD, severe morbid obesity with OSA, Cor Pulmonale and ACINOTOBACTER SPECIES PNEUMONIA s/p TRACH -Full vent support, implement lung protective strategies -Wean FiO2 & PEEP as tolerated to maintain O2 sats 88 to 92% -Follow intermittent Chest X-ray & ABG as needed -Spontaneous Breathing Trials/ Trach collar trials when respiratory parameters met and mental status permits -Implement VAP Bundle -Bronchodilators -Completed course of ABX  ACINETOBACTER CALCOACETICUS/BAUMANNII COMPLEX PNEUMONIA>>treated -Monitor fever curve -Trend WBC's  -Follow cultures as above -Completed course of ABX (7 day course of Cefepime completed 6/30)  Septic Shock>>resolved Hypertension Chronic Tachycardia -Continuous cardiac monitoring -Maintain MAP >65 -Vasopressors as needed to maintain MAP goal -Hold Metoprolol and Cardizem for now as BP is soft  Metabolic Alkalosis -Monitor I&O's / urinary output -Follow BMP -Ensure adequate renal perfusion -Avoid nephrotoxic agents as able -Replace electrolytes as indicated -Consider Diamox  Acute Metabolic Encephalopathy in the setting of Hypercapnia PMHx of Depression -Placed on vent, follow up ABG -Pt able to arouse to voice,  with no focal deficits -Avoid sedating meds as able -Provide supportive care -PT/OT/Speech Therapy -Continue Ability -D/c Trazodone (likely contributed to Hypercapnia)  Best Practice (right click and "Reselect all SmartList Selections" daily)    Diet/type: NPO DVT prophylaxis: LMWH GI prophylaxis: PPI Lines: N/A Foley:  N/A Code Status:  DNR Last date of multidisciplinary goals of care discussion [04/04/21]  Updated pt's spouse at bedside 04/04/21.  Labs   CBC: No results for input(s): WBC, NEUTROABS, HGB, HCT, MCV, PLT in the last 168 hours.  Basic Metabolic Panel: Recent Labs  Lab 04/04/21 0611  NA 140  K 4.9  CL 91*  CO2 43*  GLUCOSE 188*  BUN 20  CREATININE 0.70  CALCIUM 9.3   GFR: Estimated Creatinine Clearance: 140.9 mL/min (by C-G formula based on SCr of 0.7 mg/dL). No results for input(s): PROCALCITON, WBC, LATICACIDVEN in the last 168 hours.  Liver Function Tests: No results for input(s): AST, ALT, ALKPHOS, BILITOT, PROT, ALBUMIN in the last 168 hours. No results for input(s): LIPASE, AMYLASE in the last 168 hours. No results for input(s): AMMONIA in the last 168 hours.  ABG    Component Value Date/Time   PHART 7.35 04/04/2021 0934   PCO2ART 96 (HH) 04/04/2021 0934   PO2ART 63 (L) 04/04/2021 0934   HCO3 53.0 (H) 04/04/2021 0934   O2SAT 90.6 04/04/2021 0934     Coagulation Profile: No results for input(s): INR, PROTIME in the last 168 hours.  Cardiac Enzymes: No results for input(s): CKTOTAL, CKMB, CKMBINDEX, TROPONINI in the last 168 hours.  HbA1C: Hgb A1c MFr Bld  Date/Time Value Ref Range Status  03/02/2021 04:52 AM 5.9 (H) 4.8 - 5.6 % Final    Comment:    (NOTE)         Prediabetes: 5.7 -  6.4         Diabetes: >6.4         Glycemic control for adults with diabetes: <7.0     CBG: Recent Labs  Lab 04/03/21 2043 04/03/21 2354 04/04/21 0408 04/04/21 0725 04/04/21 1016  GLUCAP 159* 194* 207* 167* 152*    Review of Systems:   Unable to assess due to Altered Mental Status  Past Medical History:  He,  has a past medical history of Adenomatous colon polyp, Depression, Ear drum perforation, GERD (gastroesophageal reflux disease), Hyperlipidemia, Hypertension, and T8 vertebral  fracture ([place]).   Surgical History:   Past Surgical History:  Procedure Laterality Date   CARPAL TUNNEL RELEASE     left hand   COLONOSCOPY  09/18/2009, 09/15/2014   COLONOSCOPY WITH PROPOFOL N/A 12/29/2017   Procedure: COLONOSCOPY WITH PROPOFOL;  Surgeon: Manya Silvas, MD;  Location: [facility] ENDOSCOPY;  Service: Endoscopy;  Laterality: N/A;   EYE SURGERY     FRACTURE SURGERY     HERNIA REPAIR     LIPOMA EXCISION     SPINE SURGERY     TRACHEOSTOMY TUBE PLACEMENT N/A 03/07/2021   Procedure: TRACHEOSTOMY;  Surgeon: Margaretha Sheffield, MD;  Location: [facility] ORS;  Service: ENT;  Laterality: N/A;     Social History:   reports that he has never smoked. He has never used smokeless tobacco. He reports that he does not drink alcohol and does not use drugs.   Family History:  His family history includes Cancer in his mother; Varicose Veins in his mother.   Allergies Allergies  Allergen Reactions   Divalproex Sodium Other (See Comments)    Elevated liver enzymes   Valproic Acid     unknown     Home Medications  Prior to Admission medications   Medication Sig Start Date End Date Taking? Authorizing Provider  acetaminophen (TYLENOL) 325 MG tablet Take 2 tablets (650 mg total) by mouth every 6 (six) hours as needed for fever. 10/20/20  Yes Dwyane Dee, MD  albuterol (VENTOLIN HFA) 108 (90 Base) MCG/ACT inhaler Inhale 2 puffs into the lungs every 6 (six) hours as needed for wheezing or shortness of breath.   Yes [provider]  aspirin EC 81 MG tablet Take 1 tablet (81 mg total) by mouth daily. 10/20/20  Yes Dwyane Dee, MD  DULoxetine (CYMBALTA) 30 MG capsule Take 30 mg by mouth daily.   Yes [provider]  fluticasone (FLONASE) 50 MCG/ACT nasal spray Place 2 sprays into both nostrils daily as needed for allergies or rhinitis.   Yes [provider]  furosemide (LASIX) 20 MG tablet Take 20 mg by mouth daily.   Yes [provider]   HYDROcodone-acetaminophen (NORCO/VICODIN) 5-325 MG tablet Take 1 tablet by mouth every 8 (eight) hours as needed for moderate pain or severe pain.   Yes [provider]  metoprolol succinate (TOPROL-XL) 100 MG 24 hr tablet Take 1 tablet (100 mg total) by mouth daily. Take with or immediately following a meal. 10/21/20  Yes Dwyane Dee, MD  Multiple Vitamin (MULTIVITAMIN WITH MINERALS) TABS tablet Take 1 tablet by mouth daily. 10/21/20  Yes Dwyane Dee, MD  pantoprazole (PROTONIX) 40 MG tablet Take 1 tablet (40 mg total) by mouth daily. Patient taking differently: Take 40 mg by mouth 2 (two) times daily. 10/21/20  Yes Dwyane Dee, MD  spironolactone (ALDACTONE) 25 MG tablet Take 25 mg by mouth daily.   Yes [provider]  telmisartan (MICARDIS) 40 MG tablet Take  40 mg by mouth daily.   Yes [provider]  traZODone (DESYREL) 50 MG tablet Take 50-100 mg by mouth at bedtime as needed for sleep.   Yes [provider]  feeding supplement (ENSURE ENLIVE / ENSURE PLUS) LIQD Take 237 mLs by mouth 2 (two) times daily between meals. 10/21/20   Dwyane Dee, MD     Critical care time: 42 minutes     Darel Hong, AGACNP-BC [facility] Prefer epic messenger for cross cover needs If after hours, please call E-link

## 2021-04-04 NOTE — Progress Notes (Signed)
Pt has some desaturations while sleeping but comes back up to baseline of greater than 88.  RT is closely monitoring patient at this time.

## 2021-04-04 NOTE — Progress Notes (Addendum)
   04/04/21 0931  Assess: MEWS Score  Temp 97.9 F (36.6 C)  BP 128/78  Pulse Rate (!) 102  Resp (!) 34  SpO2 95 %  O2 Device Nasal Cannula  O2 Flow Rate (L/min) 4 L/min  Assess: MEWS Score  MEWS Temp 0  MEWS Systolic 0  MEWS Pulse 1  MEWS RR 2  MEWS LOC 0  MEWS Score 3  MEWS Score Color Yellow  Assess: if the MEWS score is Yellow or Red  Were vital signs taken at a resting state? Yes  Focused Assessment Change from prior assessment (see assessment flowsheet)  Does the patient meet 2 or more of the SIRS criteria? No  Does the patient have a confirmed or suspected source of infection? No  Provider and Rapid Response Notified? Yes  MEWS guidelines implemented *See Row Information* Yes  Treat  MEWS Interventions Escalated (See documentation below)  Take Vital Signs  Increase Vital Sign Frequency  Yellow: Q 2hr X 2 then Q 4hr X 2, if remains yellow, continue Q 4hrs  Escalate  MEWS: Escalate Yellow: discuss with charge nurse/RN and consider discussing with provider and RRT  Notify: Charge Nurse/RN  Name of Charge Nurse/RN Notified [place], RN  Date Charge Nurse/RN Notified 04/04/21  Time Charge Nurse/RN Notified 0940  Notify: Provider  Provider Name/Title Dr. Leslye Peer  Date Provider Notified 04/04/21  Time Provider Notified 0930  Notification Type Page  Notification Reason Change in status  Provider response See new orders  Date of Provider Response 04/04/21  Time of Provider Response 0935  Notify: Rapid Response  Name of Rapid Response RN Notified Sarah, RN  Date Rapid Response Notified 04/04/21  Time Rapid Response Notified 0940  Document  Patient Outcome Transferred/level of care increased  Progress note created (see row info) Yes  Assess: SIRS CRITERIA  SIRS Temperature  0  SIRS Pulse 1  SIRS Respirations  1  SIRS WBC 0  SIRS Score Sum  2  Report given to  York Cerise, RN ICU Nurse at the bedside in ICU

## 2021-04-04 NOTE — Progress Notes (Signed)
[name], male   DOB: [birth year], 60 y.o.   MRN: [id number] [facility] PROGRESS NOTE  Eddie Chapman JJK:[id number] DOB: [birth year] DOA: 02/23/2021 PCP: Adin Hector, MD  HPI/Subjective: Responded to a rapid response.  Patient lethargic but does follow few commands and falls back to sleep.  CO2 on chemistry very high, PCO2 on ABG just done is 96.  We will transfer back to the ICU.  Objective: Vitals:   04/04/21 0943 04/04/21 0950  BP: (!) 111/55 110/71  Pulse: (!) 49 79  Resp:    Temp:    SpO2: 96%     Intake/Output Summary (Last 24 hours) at 04/04/2021 0956 Last data filed at 04/03/2021 2300 Gross per 24 hour  Intake 720 ml  Output 100 ml  Net 620 ml   Filed Weights   03/31/21 0433 04/02/21 0500 04/03/21 0256  Weight: (!) 136.2 kg (!) 136.1 kg (!) 147.2 kg    ROS: Review of Systems  Unable to perform ROS: Acuity of condition  Respiratory:  Negative for shortness of breath.   Cardiovascular:  Negative for chest pain.  Exam: Physical Exam HENT:     Head: Normocephalic.     Mouth/Throat:     Pharynx: No oropharyngeal exudate.  Eyes:     General: Lids are normal.     Conjunctiva/sclera: Conjunctivae normal.     Comments: Pupils pinpoint  Cardiovascular:     Rate and Rhythm: Normal rate and regular rhythm.     Heart sounds: Normal heart sounds, S1 normal and S2 normal.  Pulmonary:     Effort: Tachypnea present.     Breath sounds: Examination of the right-lower field reveals decreased breath sounds and rhonchi. Examination of the left-lower field reveals decreased breath sounds and rhonchi. Decreased breath sounds and rhonchi present. No wheezing or rales.  Abdominal:     Palpations: Abdomen is soft.     Tenderness: There is no abdominal tenderness.  Musculoskeletal:     Right lower leg: Swelling present.     Left lower leg: Swelling present.  Skin:    General: Skin is warm.     Findings: No rash.  Neurological:     Mental Status: He is  lethargic.     Data Reviewed: Basic Metabolic Panel: Recent Labs  Lab 04/04/21 0611  NA 140  K 4.9  CL 91*  CO2 43*  GLUCOSE 188*  BUN 20  CREATININE 0.70  CALCIUM 9.3    BNP (last 3 results) Recent Labs    09/21/20 1327 10/01/20 0354 02/23/21 1246  BNP 343.1* 79.4 207.7*     CBG: Recent Labs  Lab 04/03/21 1631 04/03/21 2043 04/03/21 2354 04/04/21 0408 04/04/21 0725  GLUCAP 187* 159* 194* 207* 167*     Scheduled Meds:  ARIPiprazole  5 mg Oral Daily   vitamin C  250 mg Oral BID   chlorhexidine gluconate (MEDLINE KIT)  15 mL Mouth Rinse BID   diltiazem  30 mg Oral Q6H   enoxaparin (LOVENOX) injection  0.5 mg/kg Subcutaneous Q24H   feeding supplement  237 mL Oral TID BM   insulin aspart  0-15 Units Subcutaneous TID AC & HS   ipratropium-albuterol  3 mL Nebulization TID   metoprolol tartrate  50 mg Oral BID   multivitamin with minerals  1 tablet Oral Daily   pantoprazole  40 mg Oral Daily   polyethylene glycol  17 g Oral Daily   senna-docusate  1 tablet Oral BID  Continuous Infusions:  sodium chloride Stopped (03/11/21 1855)    Assessment/Plan:  Acute hypoxic hypercarbic respiratory failure.  Patient lethargic.  CO2 on chemistry elevated, PCO2 on ABG elevated at 96.  Transfer back to the ICU.  Case discussed with critical care specialist and I will put back on the ventilator to blow off CO2.  Hopefully this will be short term.  Patient had tracheostomy on 03/07/2021 by ENT.  May end up needing ventilator at night to blow off CO2 Acinetobacter Baumanii Complex.  Treated with cefepime for 7 days.   Septic shock has resolved Hypernatremia resolved Hypokalemia resolved Acute metabolic encephalopathy today secondary to high PCO2 Depression on Abilify.  We will get rid of trazodone Morbid obesity with a BMI of 46.56, sleep apnea        Code Status:     Code Status Orders  (From admission, onward)           Start     Ordered   03/02/21 1007   Do not attempt resuscitation (DNR)  Continuous       Question Answer Comment  In the event of cardiac or respiratory ARREST Do not call a "code blue"   In the event of cardiac or respiratory ARREST Do not perform Intubation, CPR, defibrillation or ACLS   In the event of cardiac or respiratory ARREST Use medication by any route, position, wound care, and other measures to relive pain and suffering. May use oxygen, suction and manual treatment of airway obstruction as needed for comfort.      03/02/21 1006           Code Status History     Date Active Date Inactive Code Status Order ID Comments User Context   02/23/2021 1320 03/02/2021 1006 Full Code [id number]  Ottie Glazier, MD ED   09/21/2020 1524 10/20/2020 2336 Full Code [id number]  Ottie Glazier, MD ED      Advance Directive Documentation    Flowsheet Row Most Recent Value  Type of Advance Directive Healthcare Power of Attorney  Pre-existing out of facility DNR order (yellow form or pink MOST form) --  "MOST" Form in Place? --      Family Communication: Left message for significant other Disposition Plan: Status is: Inpatient  Dispo: The patient is from: Home              Anticipated d/c is to: To be determined              Patient currently transferred back to the ICU for vent hookup for high PCO2   Difficult to place patient.  Yes  Consultants: Critical care specialist  Time spent: 29 minutes  [place]

## 2021-04-04 NOTE — Progress Notes (Addendum)
   04/03/21 0356  Assess: MEWS Score  Temp 98 F (36.7 C)  BP 102/70  Pulse Rate (!) 113  Resp (!) 32  SpO2 93 %  O2 Device Nasal Cannula  O2 Flow Rate (L/min) 6 L/min  Assess: MEWS Score  MEWS Temp 0  MEWS Systolic 0  MEWS Pulse 2  MEWS RR 2  MEWS LOC 0  MEWS Score 4  MEWS Score Color Red  Assess: if the MEWS score is Yellow or Red  Were vital signs taken at a resting state? Yes  Focused Assessment No change from prior assessment  Does the patient meet 2 or more of the SIRS criteria? Yes  Does the patient have a confirmed or suspected source of infection? No  MEWS guidelines implemented *See Row Information* Yes  Treat  MEWS Interventions Other (Comment) (Dr [name] Ace informed and has placed orders to give bolus & cardizem IV)  Pain Scale 0-10  Pain Score 0  Take Vital Signs  Increase Vital Sign Frequency  Red: Q 1hr X 4 then Q 4hr X 4, if remains red, continue Q 4hrs  Escalate  MEWS: Escalate Red: discuss with charge nurse/RN and provider, consider discussing with RRT  Notify: Charge Nurse/RN  Name of Charge Nurse/RN Notified Marcella RN  Date Charge Nurse/RN Notified 04/03/21  Time Charge Nurse/RN Notified 0424  Notify: Provider  Provider Name/Title Dr [name] Ace  Date Provider Notified 04/03/21  Time Provider Notified [phone]  Notification Type Page  Notification Reason Other (Comment) (HR back up again and tachypneic)  Provider response Other (Comment) (awaiting to place the order)  Date of Provider Response 04/03/21  Time of Provider Response 0425  Notify: Rapid Response  Name of Rapid Response RN Notified Verdis Frederickson RN  Date Rapid Response Notified 04/03/21  Time Rapid Response Notified 0433  Document  Patient Outcome Other (Comment) (still tachypneic but heart rate down to 100s, pt denies SOB. will cont to monitor)  Progress note created (see row info) Yes  Assess: SIRS CRITERIA  SIRS Temperature  0  SIRS Pulse 1  SIRS Respirations  1  SIRS WBC 0  SIRS Score Sum  2   Inserted for Audrea Muscat Ribay-Pagdatoon RN

## 2021-04-04 NOTE — Progress Notes (Addendum)
SLP Cancellation Note  Patient Details Name: Eddie Chapman MRN: [id number] DOB: 1961-03-02   Cancelled treatment:       Reason Eval/Treat Not Completed: Patient not medically ready;Medical issues which prohibited therapy (chart reviewed; attempted tx session w/ pt).  Upon entering room, noted pt had been feeding himself the pureed diet w/ thin liquids; pt was still holding the spoon in his hand w/ food residue on it. Pt was wearing his PMV; [place] O2 support, not TC O2 support. However, pt was drowsy-appearing w/ eyes closed and head to the side. Awakened pt w/ verbal/tactile stim to which he opened his eyes and mumbled a few words; speech was reduced in effort and articulation. Pt continued to appear drowsy and closed eyes again when not given verbal/tactile stim. PO tray was removed; mouth checked for oral clearing. PMV was removed, and NSG notified to come to room for further assessment. When NSG arrived, PMV was placed for pt to attempt answering questions w/ NSG. NSG called a Rapid Response post assessment; no po trials were given nor consumed further, and the tx session stopped. Rapid Response Team arrived at room. MD alerted and stated pt was transferring to CCU w/ noted CO2 per chemistry very High, PCO2 on ABG just done was 96. ST services will f/u w/ pt's status when appropriate per MD orders. Recommend frequent oral care for hygiene and stimulation of swallowing if not taking an oral diet.      Orinda Kenner, MS, CCC-SLP Speech Language Pathologist Rehab Services [phone] [facility] 04/04/2021, 2:51 PM

## 2021-04-04 NOTE — Progress Notes (Signed)
OT Cancellation Note  Patient Details Name: Eddie Chapman MRN: [id number] DOB: [birth year]   Cancelled Treatment:    Reason Eval/Treat Not Completed: Medical issues which prohibited therapy. Pt with change in status and needing higher level of care. Pt transferred to ICU. OT to complete order and will await new order once pt is medically appropriate and able to participate in OT intervention.  Darleen Crocker, MS, OTR/L , CBIS ascom [phone]  04/04/21, 11:55 AM

## 2021-04-04 NOTE — Progress Notes (Signed)
After further evaluation, patient is an ideal candidate for LTACH. Placed back on Vent today, weaning fio2 and vent as tolerated

## 2021-04-04 NOTE — Progress Notes (Signed)
Pt is now on trach collar 40% for the night and has a supplement nasal cannula on at 4L. Pt is not in distress and is resting for the night.

## 2021-04-04 NOTE — Progress Notes (Signed)
Rapid Response Event Note   Reason for Call : AMS, yellow MEWS   Initial Focused Assessment:  Patient resting in bed with PMV on with [place] in place. Speech had been working with patient when she said that he started "talking out of his head." Patient initially answering questions appropriately, but became more lethargic. Lung sounds diminished, pt tachypneic.     Interventions:  MD notified, order received by primary RN to obtain ABG. ABG resulted at 96.   Plan of Care:  Patient transferred to stepdown, plan to be placed back on vent to assist in bringing down CO2.    Event Summary:  Patient lethargic due to hypercapnia. Transferred to SD for vent support for brief period.  MD Notified: Leslye Peer Call Time: [place] Time: 8413 End Time: 2440  Mila Merry, RN

## 2021-04-04 NOTE — Progress Notes (Signed)
Chaplain responded to Rapid Response Code, prayer for patient, ministry of presence.

## 2021-04-05 ENCOUNTER — Inpatient Hospital Stay: Payer: PPO

## 2021-04-05 DIAGNOSIS — R5381 Other malaise: Secondary | ICD-10-CM | POA: Diagnosis not present

## 2021-04-05 DIAGNOSIS — Z9981 Dependence on supplemental oxygen: Secondary | ICD-10-CM | POA: Diagnosis not present

## 2021-04-05 DIAGNOSIS — I110 Hypertensive heart disease with heart failure: Secondary | ICD-10-CM | POA: Diagnosis not present

## 2021-04-05 DIAGNOSIS — J9811 Atelectasis: Secondary | ICD-10-CM | POA: Diagnosis not present

## 2021-04-05 DIAGNOSIS — J9621 Acute and chronic respiratory failure with hypoxia: Secondary | ICD-10-CM | POA: Diagnosis not present

## 2021-04-05 DIAGNOSIS — R4189 Other symptoms and signs involving cognitive functions and awareness: Secondary | ICD-10-CM | POA: Diagnosis not present

## 2021-04-05 DIAGNOSIS — J9601 Acute respiratory failure with hypoxia: Secondary | ICD-10-CM | POA: Diagnosis not present

## 2021-04-05 DIAGNOSIS — A419 Sepsis, unspecified organism: Secondary | ICD-10-CM | POA: Diagnosis not present

## 2021-04-05 DIAGNOSIS — J9602 Acute respiratory failure with hypercapnia: Secondary | ICD-10-CM | POA: Diagnosis not present

## 2021-04-05 DIAGNOSIS — E873 Alkalosis: Secondary | ICD-10-CM | POA: Diagnosis not present

## 2021-04-05 DIAGNOSIS — Z430 Encounter for attention to tracheostomy: Secondary | ICD-10-CM | POA: Diagnosis not present

## 2021-04-05 DIAGNOSIS — N179 Acute kidney failure, unspecified: Secondary | ICD-10-CM | POA: Diagnosis not present

## 2021-04-05 DIAGNOSIS — J441 Chronic obstructive pulmonary disease with (acute) exacerbation: Secondary | ICD-10-CM | POA: Diagnosis not present

## 2021-04-05 DIAGNOSIS — M6281 Muscle weakness (generalized): Secondary | ICD-10-CM | POA: Diagnosis not present

## 2021-04-05 DIAGNOSIS — E662 Morbid (severe) obesity with alveolar hypoventilation: Secondary | ICD-10-CM | POA: Diagnosis not present

## 2021-04-05 DIAGNOSIS — R0902 Hypoxemia: Secondary | ICD-10-CM | POA: Diagnosis not present

## 2021-04-05 DIAGNOSIS — R1312 Dysphagia, oropharyngeal phase: Secondary | ICD-10-CM | POA: Diagnosis not present

## 2021-04-05 DIAGNOSIS — Z9911 Dependence on respirator [ventilator] status: Secondary | ICD-10-CM | POA: Diagnosis not present

## 2021-04-05 DIAGNOSIS — J9600 Acute respiratory failure, unspecified whether with hypoxia or hypercapnia: Secondary | ICD-10-CM | POA: Diagnosis not present

## 2021-04-05 DIAGNOSIS — E669 Obesity, unspecified: Secondary | ICD-10-CM | POA: Diagnosis not present

## 2021-04-05 DIAGNOSIS — R9341 Abnormal radiologic findings on diagnostic imaging of renal pelvis, ureter, or bladder: Secondary | ICD-10-CM | POA: Diagnosis not present

## 2021-04-05 DIAGNOSIS — I10 Essential (primary) hypertension: Secondary | ICD-10-CM | POA: Diagnosis not present

## 2021-04-05 DIAGNOSIS — I272 Pulmonary hypertension, unspecified: Secondary | ICD-10-CM | POA: Diagnosis not present

## 2021-04-05 DIAGNOSIS — J9690 Respiratory failure, unspecified, unspecified whether with hypoxia or hypercapnia: Secondary | ICD-10-CM | POA: Diagnosis not present

## 2021-04-05 DIAGNOSIS — E785 Hyperlipidemia, unspecified: Secondary | ICD-10-CM | POA: Diagnosis not present

## 2021-04-05 DIAGNOSIS — Z888 Allergy status to other drugs, medicaments and biological substances status: Secondary | ICD-10-CM | POA: Diagnosis not present

## 2021-04-05 DIAGNOSIS — R6521 Severe sepsis with septic shock: Secondary | ICD-10-CM | POA: Diagnosis not present

## 2021-04-05 DIAGNOSIS — Z743 Need for continuous supervision: Secondary | ICD-10-CM | POA: Diagnosis not present

## 2021-04-05 DIAGNOSIS — I517 Cardiomegaly: Secondary | ICD-10-CM | POA: Diagnosis not present

## 2021-04-05 DIAGNOSIS — I2729 Other secondary pulmonary hypertension: Secondary | ICD-10-CM | POA: Diagnosis not present

## 2021-04-05 DIAGNOSIS — I5022 Chronic systolic (congestive) heart failure: Secondary | ICD-10-CM | POA: Diagnosis not present

## 2021-04-05 DIAGNOSIS — E870 Hyperosmolality and hypernatremia: Secondary | ICD-10-CM | POA: Diagnosis not present

## 2021-04-05 DIAGNOSIS — Z66 Do not resuscitate: Secondary | ICD-10-CM | POA: Diagnosis not present

## 2021-04-05 DIAGNOSIS — J189 Pneumonia, unspecified organism: Secondary | ICD-10-CM | POA: Diagnosis not present

## 2021-04-05 DIAGNOSIS — I5023 Acute on chronic systolic (congestive) heart failure: Secondary | ICD-10-CM | POA: Diagnosis not present

## 2021-04-05 DIAGNOSIS — G4733 Obstructive sleep apnea (adult) (pediatric): Secondary | ICD-10-CM | POA: Diagnosis not present

## 2021-04-05 DIAGNOSIS — Z9989 Dependence on other enabling machines and devices: Secondary | ICD-10-CM | POA: Diagnosis not present

## 2021-04-05 DIAGNOSIS — J9622 Acute and chronic respiratory failure with hypercapnia: Secondary | ICD-10-CM | POA: Diagnosis not present

## 2021-04-05 DIAGNOSIS — Z8701 Personal history of pneumonia (recurrent): Secondary | ICD-10-CM | POA: Diagnosis not present

## 2021-04-05 DIAGNOSIS — F32A Depression, unspecified: Secondary | ICD-10-CM | POA: Diagnosis not present

## 2021-04-05 DIAGNOSIS — G9341 Metabolic encephalopathy: Secondary | ICD-10-CM | POA: Diagnosis not present

## 2021-04-05 DIAGNOSIS — Z20822 Contact with and (suspected) exposure to covid-19: Secondary | ICD-10-CM | POA: Diagnosis not present

## 2021-04-05 DIAGNOSIS — Z6841 Body Mass Index (BMI) 40.0 and over, adult: Secondary | ICD-10-CM | POA: Diagnosis not present

## 2021-04-05 DIAGNOSIS — Z9119 Patient's noncompliance with other medical treatment and regimen: Secondary | ICD-10-CM | POA: Diagnosis not present

## 2021-04-05 DIAGNOSIS — R2689 Other abnormalities of gait and mobility: Secondary | ICD-10-CM | POA: Diagnosis not present

## 2021-04-05 DIAGNOSIS — J440 Chronic obstructive pulmonary disease with acute lower respiratory infection: Secondary | ICD-10-CM | POA: Diagnosis not present

## 2021-04-05 DIAGNOSIS — E8881 Metabolic syndrome: Secondary | ICD-10-CM | POA: Diagnosis not present

## 2021-04-05 DIAGNOSIS — I2781 Cor pulmonale (chronic): Secondary | ICD-10-CM | POA: Diagnosis not present

## 2021-04-05 DIAGNOSIS — R0603 Acute respiratory distress: Secondary | ICD-10-CM | POA: Diagnosis present

## 2021-04-05 DIAGNOSIS — M6250 Muscle wasting and atrophy, not elsewhere classified, unspecified site: Secondary | ICD-10-CM | POA: Diagnosis not present

## 2021-04-05 DIAGNOSIS — Z930 Tracheostomy status: Secondary | ICD-10-CM | POA: Diagnosis not present

## 2021-04-05 DIAGNOSIS — R000 Tachycardia, unspecified: Secondary | ICD-10-CM | POA: Diagnosis not present

## 2021-04-05 DIAGNOSIS — G928 Other toxic encephalopathy: Secondary | ICD-10-CM | POA: Diagnosis not present

## 2021-04-05 DIAGNOSIS — Z8781 Personal history of (healed) traumatic fracture: Secondary | ICD-10-CM | POA: Diagnosis not present

## 2021-04-05 DIAGNOSIS — J449 Chronic obstructive pulmonary disease, unspecified: Secondary | ICD-10-CM | POA: Diagnosis not present

## 2021-04-05 LAB — BASIC METABOLIC PANEL
Anion gap: 11 (ref 5–15)
BUN: 24 mg/dL — ABNORMAL HIGH (ref 8–23)
CO2: 38 mmol/L — ABNORMAL HIGH (ref 22–32)
Calcium: 9.4 mg/dL (ref 8.9–10.3)
Chloride: 90 mmol/L — ABNORMAL LOW (ref 98–111)
Creatinine, Ser: 1.01 mg/dL (ref 0.61–1.24)
GFR, Estimated: >60 mL/min (ref >60)
Glucose, Bld: 149 mg/dL — ABNORMAL HIGH (ref 70–99)
Potassium: 4.5 mmol/L (ref 3.5–5.1)
Sodium: 139 mmol/L (ref 135–145)

## 2021-04-05 LAB — CBC
HCT: 37.2 % — ABNORMAL LOW (ref 39.0–52.0)
Hemoglobin: 12.1 g/dL — ABNORMAL LOW (ref 13.0–17.0)
MCH: 33.2 pg (ref 26.0–34.0)
MCHC: 32.5 g/dL (ref 30.0–36.0)
MCV: 101.9 fL — ABNORMAL HIGH (ref 80.0–100.0)
Platelets: 161 10*3/uL (ref 150–400)
RBC: 3.65 MIL/uL — ABNORMAL LOW (ref 4.22–5.81)
RDW: 13.6 % (ref 11.5–15.5)
WBC: 13.1 10*3/uL — ABNORMAL HIGH (ref 4.0–10.5)
nRBC: 0 % (ref 0.0–0.2)

## 2021-04-05 LAB — GLUCOSE, CAPILLARY
Glucose-Capillary: 144 mg/dL — ABNORMAL HIGH (ref 70–99)
Glucose-Capillary: 123 mg/dL — ABNORMAL HIGH (ref 70–99)

## 2021-04-05 LAB — PROCALCITONIN: Procalcitonin: <0.1 ng/mL

## 2021-04-05 MED ORDER — SENNOSIDES-DOCUSATE SODIUM 8.6-50 MG PO TABS: 1.0000 | ORAL_TABLET | Freq: Two times a day (BID) | ORAL | Status: DC

## 2021-04-05 MED ORDER — ENOXAPARIN SODIUM 80 MG/0.8ML IJ SOSY: 0.5000 mg/kg | PREFILLED_SYRINGE | INTRAMUSCULAR | Status: DC

## 2021-04-05 MED ORDER — BISACODYL 10 MG RE SUPP: 10.0000 mg | Freq: Every day | RECTAL | 0 refills | Status: DC | PRN

## 2021-04-05 MED ORDER — POLYVINYL ALCOHOL 1.4 % OP SOLN: 1.0000 [drp] | OPHTHALMIC | 0 refills | Status: DC | PRN

## 2021-04-05 MED ORDER — IPRATROPIUM-ALBUTEROL 0.5-2.5 (3) MG/3ML IN SOLN: 3.0000 mL | Freq: Three times a day (TID) | RESPIRATORY_TRACT | Status: DC

## 2021-04-05 MED ORDER — CHLORHEXIDINE GLUCONATE 0.12% ORAL RINSE (MEDLINE KIT): 15.0000 mL | Freq: Two times a day (BID) | OROMUCOSAL | 0 refills | Status: DC

## 2021-04-05 MED ORDER — METOPROLOL TARTRATE 5 MG/5ML IV SOLN
5.0000 mg | Freq: Four times a day (QID) | INTRAVENOUS | Status: DC
  Administered 2021-04-05: 5 mg via INTRAVENOUS
  Filled 2021-04-05: qty 5

## 2021-04-05 MED ORDER — CHLORHEXIDINE GLUCONATE CLOTH 2 % EX PADS: 6.0000 | MEDICATED_PAD | Freq: Every day | CUTANEOUS | Status: DC

## 2021-04-05 MED ORDER — DOCUSATE SODIUM 100 MG PO CAPS: 100.0000 mg | ORAL_CAPSULE | Freq: Two times a day (BID) | ORAL | 0 refills | Status: DC | PRN

## 2021-04-05 MED ORDER — OXYCODONE HCL 10 MG PO TABS: 10.0000 mg | ORAL_TABLET | Freq: Four times a day (QID) | ORAL | 0 refills | Status: DC | PRN

## 2021-04-05 MED ORDER — METOPROLOL TARTRATE 5 MG/5ML IV SOLN: 5.0000 mg | Freq: Four times a day (QID) | INTRAVENOUS | Status: DC

## 2021-04-05 MED ORDER — POLYETHYLENE GLYCOL 3350 17 G PO PACK: 17.0000 g | PACK | Freq: Every day | ORAL | 0 refills | Status: DC

## 2021-04-05 MED ORDER — ARIPIPRAZOLE 5 MG PO TABS: 5.0000 mg | ORAL_TABLET | Freq: Every day | ORAL | Status: DC

## 2021-04-05 MED ORDER — ENSURE ENLIVE PO LIQD: 237.0000 mL | Freq: Three times a day (TID) | ORAL | 12 refills | Status: DC

## 2021-04-05 MED ORDER — SODIUM CHLORIDE 0.9 % IV SOLN: 250.0000 mL | INTRAVENOUS | 0 refills | Status: DC

## 2021-04-05 MED ORDER — DOCUSATE SODIUM 50 MG/5ML PO LIQD
100.0000 mg | Freq: Two times a day (BID) | ORAL | 0 refills | Status: DC
Start: 2021-04-05 — End: 2021-05-17

## 2021-04-05 MED ORDER — INSULIN ASPART 100 UNIT/ML IJ SOLN: 0.0000 [IU] | Freq: Three times a day (TID) | INTRAMUSCULAR | 11 refills | Status: DC

## 2021-04-05 MED ORDER — ASCORBIC ACID 250 MG PO TABS: 250.0000 mg | ORAL_TABLET | Freq: Two times a day (BID) | ORAL | Status: DC

## 2021-04-05 NOTE — Progress Notes (Addendum)
PMV given to Care link RN to transport with patient. Pt left the unit with care link by stretcher, denies any need at this time. VSS.

## 2021-04-05 NOTE — Progress Notes (Addendum)
NAME:  Eddie Chapman, MRN:  [id number], DOB:  [birth year], LOS: 27 ADMISSION DATE:  02/23/2021, CONSULTATION DATE:  04/04/2021 REFERRING MD:  Dr. Leslye Peer, CHIEF COMPLAINT:  Acute Hypoxic & Hypercapnic Respiratory Failure   Brief Pt Description / Synopsis:  60 yo morbidly obese male with acute on chronic hypoxic and hypercapnic respiratory failure due to [place] and AECOPD, along with severe toxic metabolic encephalopathy.  Failure to wean from vent requiring Tracheostomy on 03/07/2021.  History of Present Illness:  Patient has a complex comorbid history including systolic CHF chronically, pulmonary hypertension, obstructive sleep apnea, chronic hypoxemia with respiratory failure, obesity hypoventilation syndrome with thoracic restriction, hormonal dysfunction with hypotestosteronism, dyslipidemia, metabolic syndrome, previous subarachnoid hemorrhage, history of traumatic thoracic spinal injury, depression.  Patient was admitted in January to the intensive care unit with altered mental status acute hypoxemic and hypercapnic respiratory failure with metabolic encephalopathy and left lower lobe severe pneumonia.  Throughout hospitalization he was noted to have lethargy with hypercapnic encephalopathy and became unresponsive a few times even postextubation while on medical floor.  Patient has never smoked in the past.   On this admission he was poorly responsive with hypercapnic hypoxemic respiratory failure  Pertinent  Medical History  COPD OSA Morbid Obesity Hypertension Hyperlipidemia GERD Depression T8 Vertebral Fracture  Micro Data:  5/27: SARS-CoV-2 and influenza PCR>> negative 5/27: Blood culture x2>> no growth 5/27: Urine>> no growth 5/27: MRSA PCR>> negative 5/28: Sputum>> normal respiratory flora 5/30: Respiratory viral panel>> negative 5/31: SPUTUM CULTURE >> ACINETOBACTER CALCOACETICUS/BAUMANNII COMPLEX  6/13: Tracheal aspirate>>ACINETOBACTER  CALCOACETICUS/BAUMANNII COMPLEX 6/24: Blood culture x2>>no growth 6/24: Tracheal aspirate>>ACINETOBACTER CALCOACETICUS/BAUMANNII COMPLEX   Antimicrobials:  Azithromycin 5/27>>5/29 Ceftriaxone 5/27>>5/29 Vancomycin 5/29>>5/30, restarted 6/24>>6/27 Zosyn 5/29>>6/1 Unasyn 6/1>>6/10 Cefepime 6/24>>6/30  Significant Hospital Events: Including procedures, antibiotic start and stop dates in addition to other pertinent events   02/24/21- Patient responded well to mechanical ventilation with improvement in hypercapnia on ABG this am.  Seems that he had THC induced apnea/hypopnea based on laboratory testing.  There is bibasilar atelectasis induced severe hypoxemia, recruitment Metaneb therapy has been ordered. Plan to repeat CXR this afternoon with SBT today.  Reviewed plan with RN and RT today.     02/25/21- patient was unable to pass SBT today. He was able to come off propofol with RASS-0 and is weaned down on levophed.     02/26/21- patient continues to require levophed, he has been difficult to wean. I have asked ID for consultation due to concern for sepsis possibly due to CAP. Unable to perform SBT today. Family at bedside.   02/27/21- patient on PRVC with heavy secretions, difficulty weaning with high narcotic tolerance. Patient had suicide attempt with requirement for psych.   6/1 failing to wean from vent, DNR status 6/2 failed to wean from vent, severe hypoxia 6/3 severe hypoxia, failure to wean from vent, needs [facility], ENT consulted 6/4 failure to wean from  6/6 failure to wean from vent, plan for [facility] 6/7 failure to wean from vent 6/8 plan for [facility] TODAY 6/9 left foot cyanosis, vasc surgery consulted 6/10: Agitation overnight, added oxycodone, valium, seroquel.  SBT in progress.  Working with PT 6/11: Less agitated, however, did not tolerate SBT today 6/12: Dilaudid being weaned off, less agitated.  Can actually speak with trach.  Tolerating SBT 03/12/21- patient febrile, Trache  aspirate sent for viral workup. Patient with anasarca on examination. Adequate UOP.  On PRVC 35%, GCS3T. 03/13/21- TCC having difficulty finding CIR placement.  There is  RLE swelling and hypothermia and DVT study was ordered, he is with severe anxiety during SBT today and we reviewed MAR with addition of Abilify due to no QTC effect.   03/14/21- patient is awake, he is attempting again for trache collar trial today.  His secretions are improved, he is vocalizing and trying to write for communication. 03/15/21- patient is alert he communicates via gesturing and attempts to vocalize.  Still in withdrawal and on precedex but calm and appropriate.  Plan is to wean precedex and get to PT/OT. 03/16/21- patient is improved mentation, plan for PMV and SLP with ongoing PNMR transfer for recovery.  He will need psych eval at some point due to suicide attempt 03/17/21- patient is speaking via gesturing and writing.  Trach changed to XLT 6.  Ongoing plan for CIR.   03/18/21- patient continue to improve, he will be able to join PT/OT with plan to move to [facility] inpatient rehab asap. 03/19/21- SBT vs Trach collar trials during day, rest on vent at night.  Lasix x1 today 03/20/21-Pts current vent settings SBT 15/5 and tolerating well will attempt to trach collar 03/21/21- Tolerating Trach collar trials, work with PT/OT 03/22/21- Continues to tolerate Trach collar trial during day. Speech consulted for PMV. New Leukocytosis (WBC 15.5) and low grade fever.  CXR without new infiltrate. No foley, and denies dysuria.  Plan to remove PICC line. 03/23/21- Persistent Low grade fever (t max 100.6), WBC slightly improved to 14.5. Meets SIRS Criteria ~Sepsis workup in progress, placed on empiric Cefepime & Vancomycin, tolerating TC trials during day.  Holding Lasix today due to soft BP and worsening Creatinine 6/26 started TCT 6/27 remains on TCT 6/28 remains on TCT, toerating oral feeds 04/03/2021-patient is interactive and is able to  vocalize with Passy-Muir valve.  He gets up to go to the bathroom on his own and states that he is confident on his feet however is not back to his baseline strength level.  Have discussed his care plan with Dr. Fritzi Mandes this morning and there is plan for possible rehab however patient has been improving so well that there is also an option for discharge home with home PT which is what he would prefer.  His secretions were good he is requiring 3 L of oxygen and 4 L with exertion.  This morning during nebulizer therapy we did increase it to 6 L transiently.  Ear nose and throat consultation has been placed for downgrading of trach to cuffless. 04/04/21: Pt lethargic, found to be hypercapnic on ABG.  Transferred to ICU with [facility] changed to Cuffed, placed on Vent 04/05/21: Remained on vent all day yesterday, today is awake and alert, is being Reevaluated for LTACH  Interim History / Subjective:  -No acute events reported overnight -Rested on vent all day/night yesterday -This morning pt is awake and alert -Afebrile, hemodynamically stable -Reports from Respiratory Therapy of white colored secretions from trach -Chest xray this morning with new right base atelectasis vs. Infiltrate ~ will check Procalcitonin ~if elevated will start ABX and obtain tracheal aspirate -Being reevaluated for LTACH   Objective   Blood pressure 115/79, pulse (!) 110, temperature 98.6 F (37 C), temperature source Oral, resp. rate 16, height '5\' 10"'  (1.778 m), weight (!) 141.7 kg, SpO2 94 %.    Vent Mode: PRVC FiO2 (%):  [35 %-50 %] 40 % Set Rate:  [16 bmp] 16 bmp Vt Set:  [500 mL] 500 mL PEEP:  [5 cmH20-10 cmH20] 5 cmH20  Intake/Output Summary (Last 24 hours) at 04/05/2021 0837 Last data filed at 04/05/2021 0530 Gross per 24 hour  Intake --  Output 775 ml  Net -775 ml    Filed Weights   04/02/21 0500 04/03/21 0256 04/05/21 0523  Weight: (!) 136.1 kg (!) 147.2 kg (!) 141.7 kg    Examination: General: Acute on  chronically ill-appearing male, sitting in bed,  on ventilator, no acute distress HENT: Atraumatic, normocephalic, neck supple, no JVD Lungs: Coarse breath sounds bilaterally, no wheezing or rhonchi auscultated, synchronous with the vent, even Cardiovascular: Tachycardia, Regular rhythm (sinus tachycardia on telemetry), S1-S2, no murmurs, rubs, gallops, 2+ distal pulses Abdomen: Obese, soft, nontender, nondistended, no guarding rebound tenderness, bowel sounds positive x4 Extremities: Normal bulk and tone, no deformities, no edema Neuro: Awake and alert, orientation difficult to access due to vent and trach,  moves all extremities to command and follows simple commands, no focal deficits, pupils PERRLA GU: External catheter in place  Resolved Hospital Problem list     Assessment & Plan:   Acute on chronic hypoxic and hypercapnic respiratory failure in the setting of AECOPD, severe morbid obesity with OSA, Cor Pulmonale and ACINOTOBACTER SPECIES PNEUMONIA s/p TRACH -Full vent support, implement lung protective strategies -Wean FiO2 & PEEP as tolerated to maintain O2 sats 88 to 92% -Follow intermittent Chest X-ray & ABG as needed -Spontaneous Breathing Trials/ Trach collar trials when respiratory parameters met and mental status permits -Implement VAP Bundle -Bronchodilators -Completed course of ABX -Pt is an Excellent candidate for LTACH  ACINETOBACTER CALCOACETICUS/BAUMANNII COMPLEX PNEUMONIA>>treated -Monitor fever curve -Trend WBC's  -Follow cultures as above -Completed course of ABX (7 day course of Cefepime completed 6/30) -Chest X-ray today 7/7 with new right base atelectasis vs. Infiltrate ~ will check Procalcitonin and if elevated will obtain tracheal aspirate and start ABX  Septic Shock>>resolved Hypertension Chronic Tachycardia -Continuous cardiac monitoring -Maintain MAP >65 -Vasopressors as needed to maintain MAP goal -Will resume Metoprolol slowly and assess response  before adding back Cardizem  Metabolic Alkalosis -Monitor I&O's / urinary output -Follow BMP -Ensure adequate renal perfusion -Avoid nephrotoxic agents as able -Replace electrolytes as indicated -Consider Diamox  Acute Metabolic Encephalopathy in the setting of Hypercapnia>>resolved PMHx of Depression -Likely will need Vent at night to prevent recurrent episodes of Hypercapnia -Avoid sedating meds as able -Provide supportive care -PT/OT/Speech Therapy -Continue Ability -D/c Trazodone (likely contributed to Hypercapnia)  Best Practice (right click and "Reselect all SmartList Selections" daily)   Diet/type: NPO DVT prophylaxis: LMWH GI prophylaxis: PPI Lines: N/A Foley:  N/A Code Status:  DNR Last date of multidisciplinary goals of care discussion [04/05/21]    Labs   CBC: Recent Labs  Lab 04/04/21 1120 04/05/21 0538  WBC 12.6* 13.1*  NEUTROABS 10.1*  --   HGB 12.2* 12.1*  HCT 38.9* 37.2*  MCV 102.1* 101.9*  PLT 188 338    Basic Metabolic Panel: Recent Labs  Lab 04/04/21 0611 04/05/21 0538  NA 140 139  K 4.9 4.5  CL 91* 90*  CO2 43* 38*  GLUCOSE 188* 149*  BUN 20 24*  CREATININE 0.70 1.01  CALCIUM 9.3 9.4    GFR: Estimated Creatinine Clearance: 109.2 mL/min (by C-G formula based on SCr of 1.01 mg/dL). Recent Labs  Lab 04/04/21 1120 04/05/21 0538  WBC 12.6* 13.1*    Liver Function Tests: No results for input(s): AST, ALT, ALKPHOS, BILITOT, PROT, ALBUMIN in the last 168 hours. No results for input(s): LIPASE, AMYLASE in the last 168 hours.  No results for input(s): AMMONIA in the last 168 hours.  ABG    Component Value Date/Time   PHART 7.40 04/04/2021 1153   PCO2ART 87 (HH) 04/04/2021 1153   PO2ART 90 04/04/2021 1153   HCO3 53.9 (H) 04/04/2021 1153   O2SAT 96.9 04/04/2021 1153      Coagulation Profile: No results for input(s): INR, PROTIME in the last 168 hours.  Cardiac Enzymes: No results for input(s): CKTOTAL, CKMB, CKMBINDEX,  TROPONINI in the last 168 hours.  HbA1C: Hgb A1c MFr Bld  Date/Time Value Ref Range Status  03/02/2021 04:52 AM 5.9 (H) 4.8 - 5.6 % Final    Comment:    (NOTE)         Prediabetes: 5.7 - 6.4         Diabetes: >6.4         Glycemic control for adults with diabetes: <7.0     CBG: Recent Labs  Lab 04/04/21 1016 04/04/21 1228 04/04/21 1546 04/04/21 2159 04/05/21 0811  GLUCAP 152* 145* 90 109* 144*     Review of Systems:   Positives in BOLD:  Pt denies all complaints Gen: Denies fever, chills, weight change, fatigue, night sweats HEENT: Denies blurred vision, double vision, hearing loss, tinnitus, sinus congestion, rhinorrhea, sore throat, neck stiffness, dysphagia PULM: Denies shortness of breath, cough, sputum production, hemoptysis, wheezing CV: Denies chest pain, edema, orthopnea, paroxysmal nocturnal dyspnea, palpitations GI: Denies abdominal pain, nausea, vomiting, diarrhea, hematochezia, melena, constipation, change in bowel habits GU: Denies dysuria, hematuria, polyuria, oliguria, urethral discharge Endocrine: Denies hot or cold intolerance, polyuria, polyphagia or appetite change Derm: Denies rash, dry skin, scaling or peeling skin change Heme: Denies easy bruising, bleeding, bleeding gums Neuro: Denies headache, numbness, weakness, slurred speech, loss of memory or consciousness   Past Medical History:  He,  has a past medical history of Adenomatous colon polyp, Depression, Ear drum perforation, GERD (gastroesophageal reflux disease), Hyperlipidemia, Hypertension, and T8 vertebral fracture ([place]).   Surgical History:   Past Surgical History:  Procedure Laterality Date   CARPAL TUNNEL RELEASE     left hand   COLONOSCOPY  09/18/2009, 09/15/2014   COLONOSCOPY WITH PROPOFOL N/A 12/29/2017   Procedure: COLONOSCOPY WITH PROPOFOL;  Surgeon: Manya Silvas, MD;  Location: [facility] ENDOSCOPY;  Service: Endoscopy;  Laterality: N/A;   EYE SURGERY     FRACTURE SURGERY      HERNIA REPAIR     LIPOMA EXCISION     SPINE SURGERY     TRACHEOSTOMY TUBE PLACEMENT N/A 03/07/2021   Procedure: TRACHEOSTOMY;  Surgeon: Margaretha Sheffield, MD;  Location: [facility] ORS;  Service: ENT;  Laterality: N/A;     Social History:   reports that he has never smoked. He has never used smokeless tobacco. He reports that he does not drink alcohol and does not use drugs.   Family History:  His family history includes Cancer in his mother; Varicose Veins in his mother.   Allergies Allergies  Allergen Reactions   Divalproex Sodium Other (See Comments)    Elevated liver enzymes   Valproic Acid     unknown     Home Medications  Prior to Admission medications   Medication Sig Start Date End Date Taking? Authorizing Provider  acetaminophen (TYLENOL) 325 MG tablet Take 2 tablets (650 mg total) by mouth every 6 (six) hours as needed for fever. 10/20/20  Yes Dwyane Dee, MD  albuterol (VENTOLIN HFA) 108 (90 Base) MCG/ACT inhaler Inhale 2 puffs into the lungs every 6 (six)  hours as needed for wheezing or shortness of breath.   Yes [provider]  aspirin EC 81 MG tablet Take 1 tablet (81 mg total) by mouth daily. 10/20/20  Yes Dwyane Dee, MD  DULoxetine (CYMBALTA) 30 MG capsule Take 30 mg by mouth daily.   Yes [provider]  fluticasone (FLONASE) 50 MCG/ACT nasal spray Place 2 sprays into both nostrils daily as needed for allergies or rhinitis.   Yes [provider]  furosemide (LASIX) 20 MG tablet Take 20 mg by mouth daily.   Yes [provider]  HYDROcodone-acetaminophen (NORCO/VICODIN) 5-325 MG tablet Take 1 tablet by mouth every 8 (eight) hours as needed for moderate pain or severe pain.   Yes [provider]  metoprolol succinate (TOPROL-XL) 100 MG 24 hr tablet Take 1 tablet (100 mg total) by mouth daily. Take with or immediately following a meal. 10/21/20  Yes Dwyane Dee, MD  Multiple Vitamin (MULTIVITAMIN WITH MINERALS) TABS tablet Take 1  tablet by mouth daily. 10/21/20  Yes Dwyane Dee, MD  pantoprazole (PROTONIX) 40 MG tablet Take 1 tablet (40 mg total) by mouth daily. Patient taking differently: Take 40 mg by mouth 2 (two) times daily. 10/21/20  Yes Dwyane Dee, MD  spironolactone (ALDACTONE) 25 MG tablet Take 25 mg by mouth daily.   Yes [provider]  telmisartan (MICARDIS) 40 MG tablet Take 40 mg by mouth daily.   Yes [provider]  traZODone (DESYREL) 50 MG tablet Take 50-100 mg by mouth at bedtime as needed for sleep.   Yes [provider]  feeding supplement (ENSURE ENLIVE / ENSURE PLUS) LIQD Take 237 mLs by mouth 2 (two) times daily between meals. 10/21/20   Dwyane Dee, MD     Critical care time: 35 minutes     Darel Hong, AGACNP-BC [facility] Prefer epic messenger for cross cover needs If after hours, please call E-link

## 2021-04-05 NOTE — Discharge Summary (Signed)
Physician Discharge Summary  [name] MRN: [id number] DOB/AGE: [id number] y.o.  Admit date: 02/23/2021 Discharge date: 04/05/2021   Brief Pt Description / Synopsis:  60 yo morbidly obese male with acute on chronic hypoxic and hypercapnic respiratory failure due to [place] and AECOPD, along with severe toxic metabolic encephalopathy.  Failure to wean from vent requiring Tracheostomy on 03/07/2021.   Discharge Diagnoses:   Acute on Chronic Hypoxic & Hypercapnic Respiratory Failure AECOPD Severe morbid obesity with OSA Cor Pulmonale ACINOTOBACTER SPECIES PNEUMONIA Septic Shock Hypertension Chronic Tachycardia Acute Metabolic Encephalopathy in the setting of Hypercapnia Depression                                                            Discharge Summary:  Patient has a complex comorbid history including systolic CHF chronically, pulmonary hypertension, obstructive sleep apnea, chronic hypoxemia with respiratory failure, obesity hypoventilation syndrome with thoracic restriction, hormonal dysfunction with hypotestosteronism, dyslipidemia, metabolic syndrome, previous subarachnoid hemorrhage, history of traumatic thoracic spinal injury, depression.  Patient was admitted in January to the intensive care unit with altered mental status acute hypoxemic and hypercapnic respiratory failure with metabolic encephalopathy and left lower lobe severe pneumonia.  Throughout hospitalization he was noted to have lethargy with hypercapnic encephalopathy and became unresponsive a few times even postextubation while on medical floor.  Patient has never smoked in the past.   On this admission he was poorly responsive with hypercapnic hypoxemic respiratory failure  Please see significant events section for further details regarding hospital course.   Discharge Plan by Diagnosis:   Acute on chronic hypoxic and hypercapnic respiratory failure in the setting  of AECOPD, severe morbid obesity with OSA, Cor Pulmonale and ACINOTOBACTER SPECIES PNEUMONIA s/p TRACH -Full vent support, implement lung protective strategies -Wean FiO2 & PEEP as tolerated to maintain O2 sats 88 to 92% -Follow intermittent Chest X-ray & ABG as needed -Spontaneous Breathing Trials/ Trach collar trials when respiratory parameters met and mental status permits -Implement VAP Bundle -Bronchodilators -Completed course of ABX -Pt is an Excellent candidate for LTACH   ACINETOBACTER CALCOACETICUS/BAUMANNII COMPLEX PNEUMONIA>>treated -Monitor fever curve -Trend WBC's -Follow cultures as above -Completed course of ABX (7 day course of Cefepime completed 6/30) -Chest X-ray today 7/7 with new right base atelectasis vs. Infiltrate ~ will check Procalcitonin and if elevated will obtain tracheal aspirate and start ABX   Septic Shock>>resolved Hypertension Chronic Tachycardia -Continuous cardiac monitoring -Maintain MAP >65 -Vasopressors as needed to maintain MAP goal -Will resume Metoprolol slowly and assess response before adding back Cardizem   Metabolic Alkalosis -Monitor I&O's / urinary output -Follow BMP -Ensure adequate renal perfusion -Avoid nephrotoxic agents as able -Replace electrolytes as indicated -Consider Diamox   Acute Metabolic Encephalopathy in the setting of Hypercapnia>>resolved PMHx of Depression -Likely will need Vent at night to prevent recurrent episodes of Hypercapnia -Avoid sedating meds as able -Provide supportive care -PT/OT/Speech Therapy -Continue Ability -D/c Trazodone (likely contributed to Hypercapnia)    Significant Events:  02/24/21- Patient responded well to mechanical ventilation with improvement in hypercapnia on ABG this am.  Seems that he had THC induced apnea/hypopnea based on laboratory testing.  There is bibasilar atelectasis induced severe hypoxemia, recruitment Metaneb therapy has been ordered. Plan to repeat CXR this  afternoon with SBT today.  Reviewed plan with RN and RT today.     02/25/21- patient was unable to pass SBT today. He was able to come off propofol with RASS-0 and is weaned down on levophed.     02/26/21- patient continues to require levophed, he has been difficult to wean. I have asked ID for consultation due to concern for sepsis possibly due to CAP. Unable to perform SBT today. Family at bedside.   02/27/21- patient on PRVC with heavy secretions, difficulty weaning with high narcotic tolerance. Patient had suicide attempt with requirement for psych.   6/1 failing to wean from vent, DNR status 6/2 failed to wean from vent, severe hypoxia 6/3 severe hypoxia, failure to wean from vent, needs [facility], ENT consulted 6/4 failure to wean from  6/6 failure to wean from vent, plan for [facility] 6/7 failure to wean from vent 6/8 plan for [facility] TODAY 6/9 left foot cyanosis, vasc surgery consulted 6/10: Agitation overnight, added oxycodone, valium, seroquel.  SBT in progress.  Working with PT 6/11: Less agitated, however, did not tolerate SBT today 6/12: Dilaudid being weaned off, less agitated.  Can actually speak with trach.  Tolerating SBT 03/12/21- patient febrile, Trache aspirate sent for viral workup. Patient with anasarca on examination. Adequate UOP.  On PRVC 35%, GCS3T. 03/13/21- TCC having difficulty finding CIR placement.  There is RLE swelling and hypothermia and DVT study was ordered, he is with severe anxiety during SBT today and we reviewed MAR with addition of Abilify due to no QTC effect.   03/14/21- patient is awake, he is attempting again for trache collar trial today.  His secretions are improved, he is vocalizing and trying to write for communication. 03/15/21- patient is alert he communicates via gesturing and attempts to vocalize.  Still in withdrawal and on precedex but calm and appropriate.  Plan is to wean precedex and get to PT/OT. 03/16/21- patient is improved mentation, plan for PMV  and SLP with ongoing PNMR transfer for recovery.  He will need psych eval at some point due to suicide attempt 03/17/21- patient is speaking via gesturing and writing.  Trach changed to XLT 6.  Ongoing plan for CIR.   03/18/21- patient continue to improve, he will be able to join PT/OT with plan to move to [facility] inpatient rehab asap. 03/19/21- SBT vs Trach collar trials during day, rest on vent at night.  Lasix x1 today 03/20/21-Pts current vent settings SBT 15/5 and tolerating well will attempt to trach collar 03/21/21- Tolerating Trach collar trials, work with PT/OT 03/22/21- Continues to tolerate Trach collar trial during day. Speech consulted for PMV. New Leukocytosis (WBC 15.5) and low grade fever.  CXR without new infiltrate. No foley, and denies dysuria.  Plan to remove PICC line. 03/23/21- Persistent Low grade fever (t max 100.6), WBC slightly improved to 14.5. Meets SIRS Criteria ~Sepsis workup in progress, placed on empiric Cefepime & Vancomycin, tolerating TC trials during day.  Holding Lasix today due to soft BP and worsening Creatinine 6/26 started TCT 6/27 remains on TCT 6/28 remains on TCT, toerating oral feeds 04/03/2021-patient is interactive and is able to vocalize with Passy-Muir valve.  He gets up to go to the bathroom on his own and states that he is confident on his feet however is not back to his baseline strength level.  Have discussed his care plan with Dr. Fritzi Mandes this morning and there is plan for possible rehab however patient has been improving so well that there is also an option for  discharge home with home PT which is what he would prefer.  His secretions were good he is requiring 3 L of oxygen and 4 L with exertion.  This morning during nebulizer therapy we did increase it to 6 L transiently.  Ear nose and throat consultation has been placed for downgrading of trach to cuffless. 04/04/21: Pt lethargic, found to be hypercapnic on ABG.  Transferred to ICU with [facility] changed to  Cuffed, placed on Vent 04/05/21: Remained on vent all day yesterday, today is awake and alert, is being Reevaluated for [facility]                 Significant Diagnostic Studies:  CT Head w/o Contrast 02/23/21:1. No acute intracranial pathology. 2. Mild age-related atrophy and chronic microvascular ischemic changes. Old bilateral frontal and temporal lobe infarcts. Echocardiogram 02/24/21>>1. Left ventricular ejection fraction, by estimation, is 60 to 65%. The  left ventricle has normal function. The left ventricle has no regional  wall motion abnormalities. There is mild left ventricular hypertrophy.  Left ventricular diastolic parameters  are indeterminate.   2. Right ventricular systolic function is normal. The right ventricular  size is normal.   3. The mitral valve is normal in structure. Mild mitral valve  regurgitation. No evidence of mitral stenosis.   4. The aortic valve is normal in structure. Aortic valve regurgitation is  not visualized. No aortic stenosis is present.   5. The inferior vena cava is normal in size with greater than 50%  respiratory variability, suggesting right atrial pressure of 3 mmHg Venous US Bilateral LE 03/13/21>> No evidence of deep venous thrombosis in either lower extremity.   Chest X-ray 04/05/21>>  1.  Tracheostomy tube in stable position. 2.  Stable cardiomegaly. 3. Persistent but improved left base atelectasis/infiltrate. New right base atelectasis/infiltrate. Bilateral prominent pleural thickening and or effusions again noted      Micro Data:  5/27: SARS-CoV-2 and influenza PCR>> negative 5/27: Blood culture x2>> no growth 5/27: Urine>> no growth 5/27: MRSA PCR>> negative 5/28: Sputum>> normal respiratory flora 5/30: Respiratory viral panel>> negative 5/31: SPUTUM CULTURE >> ACINETOBACTER CALCOACETICUS/BAUMANNII COMPLEX  6/13: Tracheal aspirate>>ACINETOBACTER CALCOACETICUS/BAUMANNII COMPLEX 6/24: Blood culture x2>>no growth 6/24: Tracheal  aspirate>>ACINETOBACTER CALCOACETICUS/BAUMANNII COMPLEX   Antimicrobials:  Azithromycin 5/27>>5/29 Ceftriaxone 5/27>>5/29 Vancomycin 5/29>>5/30, restarted 6/24>>6/27 Zosyn 5/29>>6/1 Unasyn 6/1>>6/10 Cefepime 6/24>>6/30   Consults:  PCCM ENT Speech Therapy   Discharge Exam:  Examination: General: Acute on chronically ill-appearing male, sitting in bed,  on ventilator, no acute distress HENT: Atraumatic, normocephalic, neck supple, no JVD Lungs: Coarse breath sounds bilaterally, no wheezing or rhonchi auscultated, synchronous with the vent, even Cardiovascular: Tachycardia, Regular rhythm (sinus tachycardia on telemetry), S1-S2, no murmurs, rubs, gallops, 2+ distal pulses Abdomen: Obese, soft, nontender, nondistended, no guarding rebound tenderness, bowel sounds positive x4 Extremities: Normal bulk and tone, no deformities, no edema Neuro: Awake and alert, orientation difficult to access due to vent and trach,  moves all extremities to command and follows simple commands, no focal deficits, pupils PERRLA GU: External catheter in place  Vitals:   04/05/21 0900 04/05/21 1000 04/05/21 1111 04/05/21 1200  BP: 119/81 120/83  125/86  Pulse: (!) 111   98  Resp:      Temp:      TempSrc:      SpO2: 97%  97% 99%  Weight:      Height:         Discharge Labs:   BMET Recent Labs  Lab 04/04/21 [id number] 04/05/21 8325  NA 140 139  K 4.9 4.5  CL 91* 90*  CO2 43* 38*  GLUCOSE 188* 149*  BUN 20 24*  CREATININE 0.70 1.01  CALCIUM 9.3 9.4    CBC Recent Labs  Lab 04/04/21 1120 04/05/21 0538  HGB 12.2* 12.1*  HCT 38.9* 37.2*  WBC 12.6* 13.1*  PLT 188 161    Anti-Coagulation No results for input(s): INR in the last 168 hours.        Allergies as of 04/05/2021       Reactions   Divalproex Sodium Other (See Comments)   Elevated liver enzymes   Valproic Acid    unknown        Medication List     STOP taking these medications    acetaminophen 325 MG  tablet Commonly known as: TYLENOL   albuterol 108 (90 Base) MCG/ACT inhaler Commonly known as: VENTOLIN HFA   aspirin EC 81 MG tablet   DULoxetine 30 MG capsule Commonly known as: CYMBALTA   fluticasone 50 MCG/ACT nasal spray Commonly known as: FLONASE   furosemide 20 MG tablet Commonly known as: LASIX   HYDROcodone-acetaminophen 5-325 MG tablet Commonly known as: NORCO/VICODIN   metoprolol succinate 100 MG 24 hr tablet Commonly known as: TOPROL-XL   multivitamin with minerals Tabs tablet   pantoprazole 40 MG tablet Commonly known as: PROTONIX   spironolactone 25 MG tablet Commonly known as: ALDACTONE   telmisartan 40 MG tablet Commonly known as: MICARDIS   traZODone 50 MG tablet Commonly known as: DESYREL       TAKE these medications    ARIPiprazole 5 MG tablet Commonly known as: ABILIFY Take 1 tablet (5 mg total) by mouth daily. Start taking on: April 06, 2021   ascorbic acid 250 MG tablet Commonly known as: VITAMIN C Take 1 tablet (250 mg total) by mouth 2 (two) times daily.   bisacodyl 10 MG suppository Commonly known as: DULCOLAX Place 1 suppository (10 mg total) rectally daily as needed for moderate constipation.   chlorhexidine gluconate (MEDLINE KIT) 0.12 % solution Commonly known as: PERIDEX 15 mLs by Mouth Rinse route 2 (two) times daily.   Chlorhexidine Gluconate Cloth 2 % Pads Apply 6 each topically daily. Start taking on: April 06, 2021   docusate sodium 100 MG capsule Commonly known as: COLACE Take 1 capsule (100 mg total) by mouth 2 (two) times daily as needed for mild constipation.   docusate 50 MG/5ML liquid Commonly known as: COLACE Place 10 mLs (100 mg total) into feeding tube 2 (two) times daily.   enoxaparin 80 MG/0.8ML injection Commonly known as: LOVENOX Inject 0.75 mLs (75 mg total) into the skin daily.   feeding supplement Liqd Take 237 mLs by mouth 3 (three) times daily between meals. What changed: when to take this    insulin aspart 100 UNIT/ML injection Commonly known as: novoLOG Inject 0-15 Units into the skin 4 (four) times daily -  before meals and at bedtime.   ipratropium-albuterol 0.5-2.5 (3) MG/3ML Soln Commonly known as: DUONEB Take 3 mLs by nebulization 3 (three) times daily.   metoprolol tartrate 5 MG/5ML Soln injection Commonly known as: LOPRESSOR Inject 5 mLs (5 mg total) into the vein every 6 (six) hours.   Oxycodone HCl 10 MG Tabs Place 1 tablet (10 mg total) into feeding tube every 6 (six) hours as needed for moderate pain.   polyethylene glycol 17 g packet Commonly known as: MIRALAX / GLYCOLAX Take 17 g by mouth daily. Start taking on: April 06, 2021   polyvinyl alcohol 1.4 % ophthalmic solution Commonly known as: LIQUIFILM TEARS Place 1 drop into both eyes as needed for dry eyes.   senna-docusate 8.6-50 MG tablet Commonly known as: Senokot-S Take 1 tablet by mouth 2 (two) times daily.   sodium chloride 0.9 % infusion Inject 250 mLs into the vein continuous.            Disposition: LTACH  Discharged Condition: DRAYCEN LEICHTER has met maximum benefit of inpatient care and is medically stable and cleared for discharge.    Time spent on disposition:  50 Minutes.     Signed: Darel Hong, AGACNP-BC [facility] Prefer epic messenger for cross cover needs If after hours, please call E-link

## 2021-04-05 NOTE — Progress Notes (Signed)
Report called to [facility] RN Carmin Richmond RN. Report to car link RN

## 2021-04-06 DIAGNOSIS — J9621 Acute and chronic respiratory failure with hypoxia: Secondary | ICD-10-CM

## 2021-04-06 DIAGNOSIS — I2781 Cor pulmonale (chronic): Secondary | ICD-10-CM

## 2021-04-06 DIAGNOSIS — R6521 Severe sepsis with septic shock: Secondary | ICD-10-CM

## 2021-04-06 DIAGNOSIS — G9341 Metabolic encephalopathy: Secondary | ICD-10-CM

## 2021-04-08 DIAGNOSIS — I2781 Cor pulmonale (chronic): Secondary | ICD-10-CM

## 2021-04-08 DIAGNOSIS — J9621 Acute and chronic respiratory failure with hypoxia: Secondary | ICD-10-CM

## 2021-04-08 DIAGNOSIS — R6521 Severe sepsis with septic shock: Secondary | ICD-10-CM

## 2021-04-08 DIAGNOSIS — G9341 Metabolic encephalopathy: Secondary | ICD-10-CM

## 2021-04-10 DIAGNOSIS — I2781 Cor pulmonale (chronic): Secondary | ICD-10-CM

## 2021-04-10 DIAGNOSIS — G9341 Metabolic encephalopathy: Secondary | ICD-10-CM

## 2021-04-10 DIAGNOSIS — R6521 Severe sepsis with septic shock: Secondary | ICD-10-CM

## 2021-04-10 DIAGNOSIS — J9621 Acute and chronic respiratory failure with hypoxia: Secondary | ICD-10-CM

## 2021-04-11 DIAGNOSIS — G9341 Metabolic encephalopathy: Secondary | ICD-10-CM

## 2021-04-11 DIAGNOSIS — J9621 Acute and chronic respiratory failure with hypoxia: Secondary | ICD-10-CM

## 2021-04-11 DIAGNOSIS — I2781 Cor pulmonale (chronic): Secondary | ICD-10-CM

## 2021-04-11 DIAGNOSIS — R6521 Severe sepsis with septic shock: Secondary | ICD-10-CM

## 2021-04-12 DIAGNOSIS — I2781 Cor pulmonale (chronic): Secondary | ICD-10-CM

## 2021-04-12 DIAGNOSIS — G9341 Metabolic encephalopathy: Secondary | ICD-10-CM

## 2021-04-12 DIAGNOSIS — R6521 Severe sepsis with septic shock: Secondary | ICD-10-CM

## 2021-04-12 DIAGNOSIS — J9621 Acute and chronic respiratory failure with hypoxia: Secondary | ICD-10-CM

## 2021-04-13 DIAGNOSIS — J9621 Acute and chronic respiratory failure with hypoxia: Secondary | ICD-10-CM

## 2021-04-13 DIAGNOSIS — G9341 Metabolic encephalopathy: Secondary | ICD-10-CM

## 2021-04-13 DIAGNOSIS — I2781 Cor pulmonale (chronic): Secondary | ICD-10-CM

## 2021-04-13 DIAGNOSIS — R6521 Severe sepsis with septic shock: Secondary | ICD-10-CM

## 2021-04-14 DIAGNOSIS — G9341 Metabolic encephalopathy: Secondary | ICD-10-CM

## 2021-04-14 DIAGNOSIS — R6521 Severe sepsis with septic shock: Secondary | ICD-10-CM

## 2021-04-14 DIAGNOSIS — J9621 Acute and chronic respiratory failure with hypoxia: Secondary | ICD-10-CM

## 2021-04-14 DIAGNOSIS — I2781 Cor pulmonale (chronic): Secondary | ICD-10-CM

## 2021-04-15 DIAGNOSIS — G9341 Metabolic encephalopathy: Secondary | ICD-10-CM

## 2021-04-15 DIAGNOSIS — J9621 Acute and chronic respiratory failure with hypoxia: Secondary | ICD-10-CM

## 2021-04-15 DIAGNOSIS — I2781 Cor pulmonale (chronic): Secondary | ICD-10-CM

## 2021-04-15 DIAGNOSIS — R6521 Severe sepsis with septic shock: Secondary | ICD-10-CM

## 2021-04-16 DIAGNOSIS — R9341 Abnormal radiologic findings on diagnostic imaging of renal pelvis, ureter, or bladder: Secondary | ICD-10-CM

## 2021-04-16 DIAGNOSIS — R6521 Severe sepsis with septic shock: Secondary | ICD-10-CM

## 2021-04-16 DIAGNOSIS — J9621 Acute and chronic respiratory failure with hypoxia: Secondary | ICD-10-CM

## 2021-04-16 DIAGNOSIS — I2781 Cor pulmonale (chronic): Secondary | ICD-10-CM

## 2021-04-17 DIAGNOSIS — G9341 Metabolic encephalopathy: Secondary | ICD-10-CM

## 2021-04-17 DIAGNOSIS — J9621 Acute and chronic respiratory failure with hypoxia: Secondary | ICD-10-CM

## 2021-04-17 DIAGNOSIS — R6521 Severe sepsis with septic shock: Secondary | ICD-10-CM

## 2021-04-17 DIAGNOSIS — I2781 Cor pulmonale (chronic): Secondary | ICD-10-CM

## 2021-04-18 DIAGNOSIS — G9341 Metabolic encephalopathy: Secondary | ICD-10-CM

## 2021-04-18 DIAGNOSIS — I2781 Cor pulmonale (chronic): Secondary | ICD-10-CM

## 2021-04-18 DIAGNOSIS — R6521 Severe sepsis with septic shock: Secondary | ICD-10-CM

## 2021-04-18 DIAGNOSIS — J9621 Acute and chronic respiratory failure with hypoxia: Secondary | ICD-10-CM

## 2021-04-19 DIAGNOSIS — G9341 Metabolic encephalopathy: Secondary | ICD-10-CM

## 2021-04-19 DIAGNOSIS — J9621 Acute and chronic respiratory failure with hypoxia: Secondary | ICD-10-CM

## 2021-04-19 DIAGNOSIS — R6521 Severe sepsis with septic shock: Secondary | ICD-10-CM

## 2021-04-19 DIAGNOSIS — I2781 Cor pulmonale (chronic): Secondary | ICD-10-CM

## 2021-04-20 DIAGNOSIS — I2781 Cor pulmonale (chronic): Secondary | ICD-10-CM

## 2021-04-20 DIAGNOSIS — E662 Morbid (severe) obesity with alveolar hypoventilation: Secondary | ICD-10-CM | POA: Diagnosis not present

## 2021-04-20 DIAGNOSIS — J9621 Acute and chronic respiratory failure with hypoxia: Secondary | ICD-10-CM

## 2021-04-20 DIAGNOSIS — G9341 Metabolic encephalopathy: Secondary | ICD-10-CM

## 2021-04-20 DIAGNOSIS — G4733 Obstructive sleep apnea (adult) (pediatric): Secondary | ICD-10-CM | POA: Diagnosis not present

## 2021-04-20 DIAGNOSIS — R6521 Severe sepsis with septic shock: Secondary | ICD-10-CM

## 2021-04-21 DIAGNOSIS — R6521 Severe sepsis with septic shock: Secondary | ICD-10-CM

## 2021-04-21 DIAGNOSIS — I2781 Cor pulmonale (chronic): Secondary | ICD-10-CM

## 2021-04-21 DIAGNOSIS — J9621 Acute and chronic respiratory failure with hypoxia: Secondary | ICD-10-CM

## 2021-04-21 DIAGNOSIS — G9341 Metabolic encephalopathy: Secondary | ICD-10-CM

## 2021-04-22 DIAGNOSIS — I2781 Cor pulmonale (chronic): Secondary | ICD-10-CM

## 2021-04-22 DIAGNOSIS — R6521 Severe sepsis with septic shock: Secondary | ICD-10-CM

## 2021-04-22 DIAGNOSIS — J9621 Acute and chronic respiratory failure with hypoxia: Secondary | ICD-10-CM

## 2021-04-22 DIAGNOSIS — G9341 Metabolic encephalopathy: Secondary | ICD-10-CM

## 2021-04-24 ENCOUNTER — Encounter: Payer: Self-pay | Admitting: Otolaryngology

## 2021-04-25 ENCOUNTER — Other Ambulatory Visit: Payer: Self-pay

## 2021-04-25 ENCOUNTER — Emergency Department: Payer: PPO

## 2021-04-25 ENCOUNTER — Inpatient Hospital Stay
Admission: EM | Admit: 2021-04-25 | Discharge: 2021-05-04 | DRG: 208 | Disposition: A | Discharge status: Home Health | Payer: PPO | Attending: Internal Medicine | Admitting: Internal Medicine

## 2021-04-25 DIAGNOSIS — Z66 Do not resuscitate: Secondary | ICD-10-CM | POA: Diagnosis not present

## 2021-04-25 DIAGNOSIS — I272 Pulmonary hypertension, unspecified: Secondary | ICD-10-CM | POA: Diagnosis not present

## 2021-04-25 DIAGNOSIS — K219 Gastro-esophageal reflux disease without esophagitis: Secondary | ICD-10-CM | POA: Diagnosis not present

## 2021-04-25 DIAGNOSIS — I5042 Chronic combined systolic (congestive) and diastolic (congestive) heart failure: Secondary | ICD-10-CM | POA: Diagnosis present

## 2021-04-25 DIAGNOSIS — R739 Hyperglycemia, unspecified: Secondary | ICD-10-CM | POA: Diagnosis not present

## 2021-04-25 DIAGNOSIS — R0600 Dyspnea, unspecified: Secondary | ICD-10-CM | POA: Diagnosis not present

## 2021-04-25 DIAGNOSIS — J441 Chronic obstructive pulmonary disease with (acute) exacerbation: Principal | ICD-10-CM | POA: Diagnosis present

## 2021-04-25 DIAGNOSIS — G894 Chronic pain syndrome: Secondary | ICD-10-CM | POA: Diagnosis not present

## 2021-04-25 DIAGNOSIS — Z6841 Body Mass Index (BMI) 40.0 and over, adult: Secondary | ICD-10-CM | POA: Diagnosis not present

## 2021-04-25 DIAGNOSIS — R509 Fever, unspecified: Secondary | ICD-10-CM

## 2021-04-25 DIAGNOSIS — G8929 Other chronic pain: Secondary | ICD-10-CM | POA: Diagnosis not present

## 2021-04-25 DIAGNOSIS — R000 Tachycardia, unspecified: Secondary | ICD-10-CM | POA: Diagnosis not present

## 2021-04-25 DIAGNOSIS — Z79899 Other long term (current) drug therapy: Secondary | ICD-10-CM | POA: Diagnosis not present

## 2021-04-25 DIAGNOSIS — R0689 Other abnormalities of breathing: Secondary | ICD-10-CM | POA: Diagnosis not present

## 2021-04-25 DIAGNOSIS — Z930 Tracheostomy status: Secondary | ICD-10-CM | POA: Diagnosis not present

## 2021-04-25 DIAGNOSIS — J9621 Acute and chronic respiratory failure with hypoxia: Secondary | ICD-10-CM | POA: Diagnosis not present

## 2021-04-25 DIAGNOSIS — R5081 Fever presenting with conditions classified elsewhere: Secondary | ICD-10-CM | POA: Diagnosis not present

## 2021-04-25 DIAGNOSIS — J9602 Acute respiratory failure with hypercapnia: Secondary | ICD-10-CM

## 2021-04-25 DIAGNOSIS — R531 Weakness: Secondary | ICD-10-CM | POA: Diagnosis not present

## 2021-04-25 DIAGNOSIS — F32A Depression, unspecified: Secondary | ICD-10-CM | POA: Diagnosis not present

## 2021-04-25 DIAGNOSIS — E785 Hyperlipidemia, unspecified: Secondary | ICD-10-CM | POA: Diagnosis present

## 2021-04-25 DIAGNOSIS — I517 Cardiomegaly: Secondary | ICD-10-CM | POA: Diagnosis not present

## 2021-04-25 DIAGNOSIS — R059 Cough, unspecified: Secondary | ICD-10-CM | POA: Diagnosis not present

## 2021-04-25 DIAGNOSIS — J9811 Atelectasis: Secondary | ICD-10-CM | POA: Diagnosis present

## 2021-04-25 DIAGNOSIS — E662 Morbid (severe) obesity with alveolar hypoventilation: Secondary | ICD-10-CM | POA: Diagnosis present

## 2021-04-25 DIAGNOSIS — G4733 Obstructive sleep apnea (adult) (pediatric): Secondary | ICD-10-CM | POA: Diagnosis not present

## 2021-04-25 DIAGNOSIS — J9690 Respiratory failure, unspecified, unspecified whether with hypoxia or hypercapnia: Secondary | ICD-10-CM | POA: Diagnosis not present

## 2021-04-25 DIAGNOSIS — J9622 Acute and chronic respiratory failure with hypercapnia: Secondary | ICD-10-CM | POA: Diagnosis not present

## 2021-04-25 DIAGNOSIS — I10 Essential (primary) hypertension: Secondary | ICD-10-CM | POA: Diagnosis not present

## 2021-04-25 DIAGNOSIS — Z86010 Personal history of colonic polyps: Secondary | ICD-10-CM

## 2021-04-25 DIAGNOSIS — Z8701 Personal history of pneumonia (recurrent): Secondary | ICD-10-CM | POA: Diagnosis not present

## 2021-04-25 DIAGNOSIS — I110 Hypertensive heart disease with heart failure: Secondary | ICD-10-CM | POA: Diagnosis not present

## 2021-04-25 DIAGNOSIS — I5033 Acute on chronic diastolic (congestive) heart failure: Secondary | ICD-10-CM | POA: Diagnosis not present

## 2021-04-25 DIAGNOSIS — R062 Wheezing: Secondary | ICD-10-CM | POA: Diagnosis not present

## 2021-04-25 DIAGNOSIS — Z20822 Contact with and (suspected) exposure to covid-19: Secondary | ICD-10-CM | POA: Diagnosis present

## 2021-04-25 DIAGNOSIS — R0902 Hypoxemia: Secondary | ICD-10-CM | POA: Diagnosis not present

## 2021-04-25 DIAGNOSIS — R0602 Shortness of breath: Secondary | ICD-10-CM | POA: Diagnosis not present

## 2021-04-25 DIAGNOSIS — J9601 Acute respiratory failure with hypoxia: Secondary | ICD-10-CM | POA: Diagnosis not present

## 2021-04-25 DIAGNOSIS — Z7401 Bed confinement status: Secondary | ICD-10-CM | POA: Diagnosis not present

## 2021-04-25 DIAGNOSIS — J90 Pleural effusion, not elsewhere classified: Secondary | ICD-10-CM | POA: Diagnosis not present

## 2021-04-25 DIAGNOSIS — R5381 Other malaise: Secondary | ICD-10-CM | POA: Diagnosis not present

## 2021-04-25 DIAGNOSIS — I5022 Chronic systolic (congestive) heart failure: Secondary | ICD-10-CM | POA: Diagnosis not present

## 2021-04-25 DIAGNOSIS — E1169 Type 2 diabetes mellitus with other specified complication: Secondary | ICD-10-CM | POA: Diagnosis not present

## 2021-04-25 LAB — BLOOD GAS, ARTERIAL
Acid-Base Excess: 14.4 mmol/L — ABNORMAL HIGH (ref 0.0–2.0)
Bicarbonate: 38.5 mmol/L — ABNORMAL HIGH (ref 20.0–28.0)
FIO2: 0.5
MECHVT: 500 mL
O2 Saturation: 97.4 %
PEEP: 5 cmH2O
Patient temperature: 37
RATE: 20 resp/min
pCO2 arterial: 44 mmHg (ref 32.0–48.0)
pH, Arterial: 7.55 — ABNORMAL HIGH (ref 7.350–7.450)
pO2, Arterial: 83 mmHg (ref 83.0–108.0)

## 2021-04-25 LAB — COMPREHENSIVE METABOLIC PANEL
ALT: 26 U/L (ref 0–44)
AST: 35 U/L (ref 15–41)
Albumin: 3.4 g/dL — ABNORMAL LOW (ref 3.5–5.0)
Alkaline Phosphatase: 63 U/L (ref 38–126)
Anion gap: 11 (ref 5–15)
BUN: 14 mg/dL (ref 8–23)
CO2: 35 mmol/L — ABNORMAL HIGH (ref 22–32)
Calcium: 9.2 mg/dL (ref 8.9–10.3)
Chloride: 95 mmol/L — ABNORMAL LOW (ref 98–111)
Creatinine, Ser: 0.78 mg/dL (ref 0.61–1.24)
GFR, Estimated: >60 mL/min (ref >60)
Glucose, Bld: 172 mg/dL — ABNORMAL HIGH (ref 70–99)
Potassium: 4.5 mmol/L (ref 3.5–5.1)
Sodium: 141 mmol/L (ref 135–145)
Total Bilirubin: 0.9 mg/dL (ref 0.3–1.2)
Total Protein: 7.4 g/dL (ref 6.5–8.1)

## 2021-04-25 LAB — LACTIC ACID, PLASMA: Lactic Acid, Venous: 1.8 mmol/L (ref 0.5–1.9)

## 2021-04-25 LAB — CBC
HCT: 43.8 % (ref 39.0–52.0)
Hemoglobin: 14.2 g/dL (ref 13.0–17.0)
MCH: 32.5 pg (ref 26.0–34.0)
MCHC: 32.4 g/dL (ref 30.0–36.0)
MCV: 100.2 fL — ABNORMAL HIGH (ref 80.0–100.0)
Platelets: 211 10*3/uL (ref 150–400)
RBC: 4.37 MIL/uL (ref 4.22–5.81)
RDW: 13.4 % (ref 11.5–15.5)
WBC: 13.4 10*3/uL — ABNORMAL HIGH (ref 4.0–10.5)
nRBC: 0 % (ref 0.0–0.2)

## 2021-04-25 LAB — BLOOD GAS, VENOUS
Acid-Base Excess: 12.5 mmol/L — ABNORMAL HIGH (ref 0.0–2.0)
Bicarbonate: 44.5 mmol/L — ABNORMAL HIGH (ref 20.0–28.0)
O2 Saturation: 87.4 %
Patient temperature: 37
pCO2, Ven: 97 mmHg (ref 44.0–60.0)
pH, Ven: 7.27 (ref 7.250–7.430)
pO2, Ven: 61 mmHg — ABNORMAL HIGH (ref 32.0–45.0)

## 2021-04-25 LAB — CBG MONITORING, ED: Glucose-Capillary: 176 mg/dL — ABNORMAL HIGH (ref 70–99)

## 2021-04-25 LAB — PROCALCITONIN: Procalcitonin: <0.1 ng/mL

## 2021-04-25 LAB — GLUCOSE, CAPILLARY
Glucose-Capillary: 119 mg/dL — ABNORMAL HIGH (ref 70–99)
Glucose-Capillary: 123 mg/dL — ABNORMAL HIGH (ref 70–99)
Glucose-Capillary: 126 mg/dL — ABNORMAL HIGH (ref 70–99)
Glucose-Capillary: 155 mg/dL — ABNORMAL HIGH (ref 70–99)
Glucose-Capillary: 157 mg/dL — ABNORMAL HIGH (ref 70–99)

## 2021-04-25 LAB — TROPONIN I (HIGH SENSITIVITY): Troponin I (High Sensitivity): 16 ng/L (ref <18)

## 2021-04-25 LAB — RESP PANEL BY RT-PCR (FLU A&B, COVID) ARPGX2
Influenza A by PCR: NEGATIVE
Influenza B by PCR: NEGATIVE
SARS Coronavirus 2 by RT PCR: NEGATIVE

## 2021-04-25 LAB — MRSA NEXT GEN BY PCR, NASAL: MRSA by PCR Next Gen: DETECTED — AB

## 2021-04-25 LAB — MAGNESIUM: Magnesium: 2.1 mg/dL (ref 1.7–2.4)

## 2021-04-25 LAB — PHOSPHORUS: Phosphorus: 4.5 mg/dL (ref 2.5–4.6)

## 2021-04-25 LAB — BRAIN NATRIURETIC PEPTIDE: B Natriuretic Peptide: 55 pg/mL (ref 0.0–100.0)

## 2021-04-25 MED ORDER — BUDESONIDE 0.5 MG/2ML IN SUSP
0.5000 mg | Freq: Two times a day (BID) | RESPIRATORY_TRACT | Status: DC
  Administered 2021-04-25 – 2021-05-04 (×19): 0.5 mg via RESPIRATORY_TRACT
  Filled 2021-04-25 (×19): qty 2

## 2021-04-25 MED ORDER — FAMOTIDINE IN NACL 20-0.9 MG/50ML-% IV SOLN
20.0000 mg | Freq: Two times a day (BID) | INTRAVENOUS | Status: DC
  Administered 2021-04-25 – 2021-04-27 (×5): 20 mg via INTRAVENOUS
  Filled 2021-04-25 (×5): qty 50

## 2021-04-25 MED ORDER — INSULIN ASPART 100 UNIT/ML IJ SOLN
0.0000 [IU] | INTRAMUSCULAR | Status: DC
  Administered 2021-04-25: 2 [IU] via SUBCUTANEOUS
  Administered 2021-04-25 – 2021-04-26 (×2): 3 [IU] via SUBCUTANEOUS
  Administered 2021-04-26 (×2): 2 [IU] via SUBCUTANEOUS
  Filled 2021-04-25 (×6): qty 1

## 2021-04-25 MED ORDER — MUPIROCIN 2 % EX OINT
1.0000 "application " | TOPICAL_OINTMENT | Freq: Two times a day (BID) | CUTANEOUS | Status: AC
  Administered 2021-04-25 – 2021-04-30 (×10): 1 via NASAL
  Filled 2021-04-25: qty 22

## 2021-04-25 MED ORDER — IPRATROPIUM-ALBUTEROL 0.5-2.5 (3) MG/3ML IN SOLN: 3.0000 mL | Freq: Four times a day (QID) | RESPIRATORY_TRACT | Status: DC

## 2021-04-25 MED ORDER — MORPHINE SULFATE (PF) 2 MG/ML IV SOLN
2.0000 mg | INTRAVENOUS | Status: DC | PRN
Start: 2021-04-25 — End: 2021-04-27
  Administered 2021-04-25 – 2021-04-26 (×4): 2 mg via INTRAVENOUS
  Filled 2021-04-25 (×4): qty 1

## 2021-04-25 MED ORDER — VANCOMYCIN HCL IN DEXTROSE 1-5 GM/200ML-% IV SOLN
1000.0000 mg | Freq: Once | INTRAVENOUS | Status: AC
  Administered 2021-04-25: 1000 mg via INTRAVENOUS
  Filled 2021-04-25: qty 200

## 2021-04-25 MED ORDER — ENOXAPARIN SODIUM 80 MG/0.8ML IJ SOSY
0.5000 mg/kg | PREFILLED_SYRINGE | INTRAMUSCULAR | Status: DC
  Administered 2021-04-25 – 2021-04-30 (×6): 67.5 mg via SUBCUTANEOUS
  Filled 2021-04-25 (×6): qty 0.8
  Filled 2021-04-25: qty 0.68

## 2021-04-25 MED ORDER — IPRATROPIUM-ALBUTEROL 0.5-2.5 (3) MG/3ML IN SOLN
3.0000 mL | RESPIRATORY_TRACT | Status: DC
  Administered 2021-04-25 – 2021-04-27 (×10): 3 mL via RESPIRATORY_TRACT
  Filled 2021-04-25 (×9): qty 3

## 2021-04-25 MED ORDER — SODIUM CHLORIDE 0.9 % IV SOLN
2.0000 g | Freq: Once | INTRAVENOUS | Status: AC
  Administered 2021-04-25: 2 g via INTRAVENOUS
  Filled 2021-04-25: qty 2

## 2021-04-25 MED ORDER — ENOXAPARIN SODIUM 40 MG/0.4ML IJ SOSY: 40.0000 mg | PREFILLED_SYRINGE | INTRAMUSCULAR | Status: DC

## 2021-04-25 MED ORDER — POLYETHYLENE GLYCOL 3350 17 G PO PACK: 17.0000 g | PACK | Freq: Every day | ORAL | Status: DC | PRN

## 2021-04-25 MED ORDER — IPRATROPIUM-ALBUTEROL 0.5-2.5 (3) MG/3ML IN SOLN
3.0000 mL | Freq: Once | RESPIRATORY_TRACT | Status: AC
  Administered 2021-04-25: 3 mL via RESPIRATORY_TRACT
  Filled 2021-04-25: qty 3

## 2021-04-25 MED ORDER — SODIUM CHLORIDE 0.9 % IV SOLN
500.0000 mg | Freq: Once | INTRAVENOUS | Status: AC
  Administered 2021-04-25: 500 mg via INTRAVENOUS
  Filled 2021-04-25: qty 500

## 2021-04-25 MED ORDER — IPRATROPIUM-ALBUTEROL 0.5-2.5 (3) MG/3ML IN SOLN
3.0000 mL | Freq: Four times a day (QID) | RESPIRATORY_TRACT | Status: DC | PRN
  Administered 2021-04-25: 3 mL via RESPIRATORY_TRACT
  Filled 2021-04-25: qty 3

## 2021-04-25 MED ORDER — METOPROLOL TARTRATE 5 MG/5ML IV SOLN: 2.5000 mg | Freq: Four times a day (QID) | INTRAVENOUS | Status: DC | PRN

## 2021-04-25 MED ORDER — CHLORHEXIDINE GLUCONATE CLOTH 2 % EX PADS
6.0000 | MEDICATED_PAD | Freq: Every day | CUTANEOUS | Status: AC
  Administered 2021-04-26 – 2021-04-29 (×4): 6 via TOPICAL

## 2021-04-25 NOTE — ED Notes (Signed)
Pt suctioned , called Respiratory to come check vent

## 2021-04-25 NOTE — ED Notes (Signed)
Pt placed on trach collar RT and suctioned. Pt on 8L 40%

## 2021-04-25 NOTE — Progress Notes (Signed)
PHARMACIST - PHYSICIAN COMMUNICATION  CONCERNING:  Enoxaparin (Lovenox) for DVT Prophylaxis    RECOMMENDATION: Patient was prescribed enoxaprin '40mg'$  q24 hours for VTE prophylaxis.   Filed Weights   04/25/21 0842  Weight: 136.1 kg (300 lb)    Body mass index is 43.05 kg/m.  Estimated Creatinine Clearance: 134.7 mL/min (by C-G formula based on SCr of 0.78 mg/dL).   Based on [place] patient is candidate for enoxaparin 0.'5mg'$ /kg TBW SQ every 24 hours based on BMI being >30.    DESCRIPTION: Pharmacy has adjusted enoxaparin dose per [facility] policy.  Patient is now receiving enoxaparin 67.5 mg every 24 hours    Wynelle Cleveland, PharmD Pharmacy Resident  04/25/2021 10:18 AM

## 2021-04-25 NOTE — ED Notes (Signed)
Pt suctioned by RT.

## 2021-04-25 NOTE — ED Provider Notes (Signed)
[facility] Emergency Department Provider Note    Event Date/Time   First MD Initiated Contact with Patient 04/25/21 [phone]     (approximate)  I have reviewed the triage vital signs and the nursing notes.   HISTORY  Chief Complaint Shortness of Breath    HPI Eddie Chapman is a 60 y.o. male below listed past medical history presents to the ER for evaluation of worsening shortness of breath cough congestion.  Report was found to be hypoxic down to the low 80s on EMS arrival.  States has been feeling worse over the past 24 hours.  Was given Solu-Medrol as well as albuterol in route with some improvement.  Arrives on 10 L via trach.  Denies any nausea or vomiting.  Past Medical History:  Diagnosis Date   Adenomatous colon polyp    Depression    Ear drum perforation    left x2    GERD (gastroesophageal reflux disease)    Hyperlipidemia    Hypertension    T8 vertebral fracture (HCC)    Family History  Problem Relation Age of Onset   Cancer Mother    Varicose Veins Mother    Past Surgical History:  Procedure Laterality Date   CARPAL TUNNEL RELEASE     left hand   COLONOSCOPY  09/18/2009, 09/15/2014   COLONOSCOPY WITH PROPOFOL N/A 12/29/2017   Procedure: COLONOSCOPY WITH PROPOFOL;  Surgeon: Manya Silvas, MD;  Location: [facility] ENDOSCOPY;  Service: Endoscopy;  Laterality: N/A;   EYE SURGERY     FRACTURE SURGERY     HERNIA REPAIR     LIPOMA EXCISION     SPINE SURGERY     TRACHEOSTOMY TUBE PLACEMENT N/A 03/07/2021   Procedure: TRACHEOSTOMY;  Surgeon: Margaretha Sheffield, MD;  Location: [facility] ORS;  Service: ENT;  Laterality: N/A;   Patient Active Problem List   Diagnosis Date Noted   Acute on chronic respiratory failure with hypercapnia ([place]) 04/25/2021   Depression    Atelectasis    Leukocytosis    Fever    Acute on chronic systolic congestive heart failure ([place])    Acute respiratory failure with hypoxia ([place])    Palliative care by specialist     Respiratory failure with hypercapnia ([place]) 02/23/2021   Acute respiratory failure with hypoxia and hypercapnia ([place]) 10/07/2020   OSA (obstructive sleep apnea) 10/07/2020   Obesity hypoventilation syndrome ([place]) 74/08/8785   Acute diastolic CHF (congestive heart failure) ([place]) 10/07/2020   Obesity, Class III, BMI 40-49.9 (morbid obesity) ([place]) 10/07/2020   Occlusion and stenosis of vertebral artery 08/19/2016   Aneurysm, cerebral, nonruptured 08/19/2016   Essential hypertension 08/19/2016   Hyperlipidemia 08/19/2016      Prior to Admission medications   Medication Sig Start Date End Date Taking? Authorizing Provider  ARIPiprazole (ABILIFY) 5 MG tablet Take 1 tablet (5 mg total) by mouth daily. 04/06/21  Yes Darel Hong D, NP  ascorbic acid (VITAMIN C) 250 MG tablet Take 1 tablet (250 mg total) by mouth 2 (two) times daily. 04/05/21   Bradly Bienenstock, NP  bisacodyl (DULCOLAX) 10 MG suppository Place 1 suppository (10 mg total) rectally daily as needed for moderate constipation. 04/05/21   Bradly Bienenstock, NP  Chlorhexidine Gluconate Cloth 2 % PADS Apply 6 each topically daily. 04/06/21   Bradly Bienenstock, NP  chlorhexidine gluconate, MEDLINE KIT, (PERIDEX) 0.12 % solution 15 mLs by Mouth Rinse route 2 (two) times daily. 04/05/21   Bradly Bienenstock, NP  docusate (  COLACE) 50 MG/5ML liquid Place 10 mLs (100 mg total) into feeding tube 2 (two) times daily. 04/05/21   Bradly Bienenstock, NP  enoxaparin (LOVENOX) 80 MG/0.8ML injection Inject 0.75 mLs (75 mg total) into the skin daily. 04/05/21   Bradly Bienenstock, NP  feeding supplement (ENSURE ENLIVE / ENSURE PLUS) LIQD Take 237 mLs by mouth 3 (three) times daily between meals. 04/05/21   Bradly Bienenstock, NP  insulin aspart (NOVOLOG) 100 UNIT/ML injection Inject 0-15 Units into the skin 4 (four) times daily -  before meals and at bedtime. 04/05/21   Darel Hong D, NP  ipratropium-albuterol (DUONEB) 0.5-2.5 (3) MG/3ML SOLN Take 3 mLs by nebulization 3  (three) times daily. 04/05/21   Bradly Bienenstock, NP  metoprolol tartrate (LOPRESSOR) 5 MG/5ML SOLN injection Inject 5 mLs (5 mg total) into the vein every 6 (six) hours. 04/05/21   Bradly Bienenstock, NP  oxyCODONE 10 MG TABS Place 1 tablet (10 mg total) into feeding tube every 6 (six) hours as needed for moderate pain. 04/05/21   Bradly Bienenstock, NP  polyethylene glycol (MIRALAX / GLYCOLAX) 17 g packet Take 17 g by mouth daily. 04/06/21   Bradly Bienenstock, NP  polyvinyl alcohol (LIQUIFILM TEARS) 1.4 % ophthalmic solution Place 1 drop into both eyes as needed for dry eyes. 04/05/21   Bradly Bienenstock, NP  senna-docusate (SENOKOT-S) 8.6-50 MG tablet Take 1 tablet by mouth 2 (two) times daily. 04/05/21   Darel Hong D, NP  sodium chloride 0.9 % infusion Inject 250 mLs into the vein continuous. 04/05/21   Bradly Bienenstock, NP    Allergies Amlodipine and Divalproex sodium    Social History Social History   Tobacco Use   Smoking status: Never   Smokeless tobacco: Never  Vaping Use   Vaping Use: Never used  Substance Use Topics   Alcohol use: Never   Drug use: Never    Review of Systems Patient denies headaches, rhinorrhea, blurry vision, numbness, shortness of breath, chest pain, edema, cough, abdominal pain, nausea, vomiting, diarrhea, dysuria, fevers, rashes or hallucinations unless otherwise stated above in HPI. ____________________________________________   PHYSICAL EXAM:  VITAL SIGNS: Vitals:   04/25/21 0930 04/25/21 1007  BP: (!) 133/92   Pulse: (!) 113 (!) 118  Resp: (!) 33 (!) 38  Temp:    SpO2: 90% 91%    Constitutional: Alert and oriented.  Eyes: Conjunctivae are normal.  Head: Atraumatic. Nose: No congestion/rhinnorhea. Mouth/Throat: Mucous membranes are moist.   Neck: No stridor. Painless ROM.  Cardiovascular: Normal rate, regular rhythm. Grossly normal heart sounds.  Good peripheral circulation. Respiratory: mild tachypnea, speaking in short phrases.  Expiratory  wheeze,  diminished left basilar bs Gastrointestinal: Soft and nontender. No distention. No abdominal bruits. No CVA tenderness. Genitourinary: deferred Musculoskeletal: No lower extremity tenderness. 2+ BLE edema.  No joint effusions. Neurologic:  Normal speech and language. No gross focal neurologic deficits are appreciated. No facial droop Skin:  Skin is warm, dry and intact. No rash noted. Psychiatric: Mood and affect are normal. Speech and behavior are normal.  ____________________________________________   LABS (all labs ordered are listed, but only abnormal results are displayed)  Results for orders placed or performed during the hospital encounter of 04/25/21 (from the past 24 hour(s))  CBC     Status: Abnormal   Collection Time: 04/25/21  8:41 AM  Result Value Ref Range   WBC 13.4 (H) 4.0 - 10.5 K/uL   RBC 4.37 4.22 -  5.81 MIL/uL   Hemoglobin 14.2 13.0 - 17.0 g/dL   HCT 43.8 39.0 - 52.0 %   MCV 100.2 (H) 80.0 - 100.0 fL   MCH 32.5 26.0 - 34.0 pg   MCHC 32.4 30.0 - 36.0 g/dL   RDW 13.4 11.5 - 15.5 %   Platelets 211 150 - 400 K/uL   nRBC 0.0 0.0 - 0.2 %  Comprehensive metabolic panel     Status: Abnormal   Collection Time: 04/25/21  8:41 AM  Result Value Ref Range   Sodium 141 135 - 145 mmol/L   Potassium 4.5 3.5 - 5.1 mmol/L   Chloride 95 (L) 98 - 111 mmol/L   CO2 35 (H) 22 - 32 mmol/L   Glucose, Bld 172 (H) 70 - 99 mg/dL   BUN 14 8 - 23 mg/dL   Creatinine, Ser 0.78 0.61 - 1.24 mg/dL   Calcium 9.2 8.9 - 10.3 mg/dL   Total Protein 7.4 6.5 - 8.1 g/dL   Albumin 3.4 (L) 3.5 - 5.0 g/dL   AST 35 15 - 41 U/L   ALT 26 0 - 44 U/L   Alkaline Phosphatase 63 38 - 126 U/L   Total Bilirubin 0.9 0.3 - 1.2 mg/dL   GFR, Estimated >60 >60 mL/min   Anion gap 11 5 - 15  Troponin I (High Sensitivity)     Status: None   Collection Time: 04/25/21  8:41 AM  Result Value Ref Range   Troponin I (High Sensitivity) 16 <18 ng/L  Brain natriuretic peptide     Status: None   Collection  Time: 04/25/21  8:41 AM  Result Value Ref Range   B Natriuretic Peptide 55.0 0.0 - 100.0 pg/mL  Lactic acid, plasma     Status: None   Collection Time: 04/25/21  8:45 AM  Result Value Ref Range   Lactic Acid, Venous 1.8 0.5 - 1.9 mmol/L  Blood gas, venous     Status: Abnormal   Collection Time: 04/25/21  9:44 AM  Result Value Ref Range   pH, Ven 7.27 7.250 - 7.430   pCO2, Ven 97 (HH) 44.0 - 60.0 mmHg   pO2, Ven 61.0 (H) 32.0 - 45.0 mmHg   Bicarbonate 44.5 (H) 20.0 - 28.0 mmol/L   Acid-Base Excess 12.5 (H) 0.0 - 2.0 mmol/L   O2 Saturation 87.4 %   Patient temperature 37.0    Collection site VEIN    Sample type VEIN    ____________________________________________  EKG My review and personal interpretation at Time: 8:39   Indication: sob  Rate: 115  Rhythm: sinus Axis: normal Other: no stemi, nonspecific twave abn ____________________________________________  RADIOLOGY  I personally reviewed all radiographic images ordered to evaluate for the above acute complaints and reviewed radiology reports and findings.  These findings were personally discussed with the patient.  Please see medical record for radiology report.  ____________________________________________   PROCEDURES  Procedure(s) performed:  .Critical Care  Date/Time: 04/25/2021 10:24 AM Performed by: Merlyn Lot, MD Authorized by: Merlyn Lot, MD   Critical care provider statement:    Critical care time (minutes):  34   Critical care time was exclusive of:  Separately billable procedures and treating other patients   Critical care was necessary to treat or prevent imminent or life-threatening deterioration of the following conditions:  Respiratory failure   Critical care was time spent personally by me on the following activities:  Development of treatment plan with patient or surrogate, discussions with consultants, evaluation of patient's response  to treatment, examination of patient, obtaining  history from patient or surrogate, ordering and performing treatments and interventions, ordering and review of laboratory studies, ordering and review of radiographic studies, pulse oximetry, re-evaluation of patient's condition and review of old [place] performed: yes ____________________________________________   INITIAL IMPRESSION / ASSESSMENT AND PLAN / ED COURSE  Pertinent labs & imaging results that were available during my care of the patient were reviewed by me and considered in my medical decision making (see chart for details).   DDX: Asthma, copd, CHF, pna, ptx, malignancy, Pe, anemia  DAESHON GRAMMATICO is a 60 y.o. who presents to the ED withsymptoms as described above  Patient and evidence of moderate respiratory distress.  With complex recent hospitalization and ICU stay was febrile in route with EMS.  Will order sepsis work-up.  Does appear dyspneic but O2 sats in the high 90s with high flow through trach.  The patient will be placed on continuous pulse oximetry and telemetry for monitoring.  Laboratory evaluation will be sent to evaluate for the above complaints.     Clinical Course as of 04/25/21 1026  Wed Apr 25, 2021  0906 Cxr by my read with left pleural effusion and concern for pna.  IV abx orderd.  Will hold off on IVF pending lactic given concern for chf.  Will check vbg.  Will give additional neb.  Already received steroid. [PR]  [phone] Patient with evidence of hypercapnic respiratory failure.  Will place on vent through trach.  Will discuss with intensivist for admission. [PR]    Clinical Course User Index [PR] Merlyn Lot, MD    The patient was evaluated in Emergency Department today for the symptoms described in the history of present illness. He/she was evaluated in the context of the global COVID-19 pandemic, which necessitated consideration that the patient might be at risk for infection with the SARS-CoV-2 virus that causes COVID-19.  Institutional protocols and algorithms that pertain to the evaluation of patients at risk for COVID-19 are in a state of rapid change based on information released by regulatory bodies including the CDC and federal and state organizations. These policies and algorithms were followed during the patient's care in the ED.  As part of my medical decision making, I reviewed the following data within the [place] notes reviewed and incorporated, Labs reviewed, notes from prior ED visits and [place] Controlled Substance Database   ____________________________________________   FINAL CLINICAL IMPRESSION(S) / ED DIAGNOSES  Final diagnoses:  Acute respiratory failure with hypoxia and hypercapnia ([place])      NEW MEDICATIONS STARTED DURING THIS VISIT:  Current Discharge Medication List       Note:  This document was prepared using Dragon voice recognition software and may include unintentional dictation errors.    Merlyn Lot, MD 04/25/21 1027

## 2021-04-25 NOTE — ED Notes (Signed)
RT contacted to assist with neb administration in trach

## 2021-04-25 NOTE — ED Notes (Signed)
PA advised repeat trop not needed

## 2021-04-25 NOTE — ED Notes (Signed)
Vent Changed

## 2021-04-25 NOTE — Progress Notes (Signed)
PHARMACY -  BRIEF ANTIBIOTIC NOTE   Pharmacy has received consult(s) for vancomycin and cefepime from an ED provider.  The patient's profile has been reviewed for ht/wt/allergies/indication/available labs.    One time order(s) placed for vancomycin 1 g + cefepime 2 g  Further antibiotics/pharmacy consults should be ordered by admitting physician if indicated.                       Thank you,  Tawnya Crook, PharmD, BCPS 04/25/2021  9:32 AM

## 2021-04-25 NOTE — Consult Note (Signed)
PHARMACY CONSULT NOTE - FOLLOW UP  Pharmacy Consult for Electrolyte Monitoring and Replacement   Recent Labs: Potassium (mmol/L)  Date Value  04/25/2021 4.5  05/04/2014 4.3   Magnesium (mg/dL)  Date Value  03/28/2021 1.9   Calcium (mg/dL)  Date Value  04/25/2021 9.2   Calcium, Total (mg/dL)  Date Value  05/04/2014 9.1   Albumin (g/dL)  Date Value  04/25/2021 3.4 (L)   Phosphorus (mg/dL)  Date Value  03/28/2021 2.8   Sodium (mmol/L)  Date Value  04/25/2021 141  05/04/2014 140     Assessment: 60yo male w/ h/o sysCHF , PulmHTN, OSA, COPD, obesity hypoventilation syndrome with thoracic restriction, hormonal dysfunction with hypotestosteronism, HLD, metabolic s/o, previous SAH, history of traumatic thoracic spinal injury, & depression presenting to ED w/ SOB.  - Recent admit [facility] to [facility] on 7/07 following prolonged hospitalization due to acute on chronic hypoxic hypercapnic respiratory failure secondary to acinotobacter species pneumonia and AECOPD requiring tracheostomy. - [facility] to home on 7/26.  - Now at [facility] ED 7/27 for c/o SOB and pharmacy consulted for the monitoring of electrolytes and renal adjustments PRN.  Goal of Therapy:  [place] WNL CL low but at BL 90-99 02/2021 CO2 chronically elevated and at [facility] 33-40 02/2021  Plan: No current repletion warranted for today. Renal fxn stable at baseline. Will CTM.  Lorna Dibble ,PharmD, [facility] Clinical Pharmacist 04/25/2021 10:20 AM

## 2021-04-25 NOTE — H&P (Signed)
NAME:  Eddie Chapman, MRN:  [id number], DOB:  01-10-1961, LOS: 0 ADMISSION DATE:  04/25/2021, CONSULTATION DATE:04/25/2021 REFERRING MD: Dr. Quentin Cornwall, CHIEF COMPLAINT:  Shortness of Breath  History of Present Illness:  This is a 60 yo male with complex comorbid history including systolic CHF chronically, pulmonary hypertension, obstructive sleep apnea, obesity hypoventilation syndrome, chronic tracheostomy, and COPD.  He presented to [facility] ER on 07/27 via EMS from home with c/o shortness of breath.  He was discharged from [facility] to [place] on 07/7 following prolonged hospitalization due to acute on chronic hypoxic hypercapnic respiratory failure secondary to acinotobacter species pneumonia and AECOPD requiring tracheostomy.  He was discharged from [facility] to home on 07/26.  Per ED notes pts family reports they DO NOT know how to take care of his tracheostomy at home.  EMS reported upon their arrival at pts home pt diaphoretic with O2 sats 85% on RA and diaphoretic, therefore he was placed on 10L of O2 and received 125 mg solumedrol.  He remained alert and oriented.   ED Course Upon arrival to the ER pt noted to have increased work of breathing despite 10L O2 via trach collar.  VBG revealed pH 7.27/CO2 97/acid-base excess 12.5/bicarb 44.5.  He was therefore placed on mechanical ventilation via chronic tracheostomy.  CXR revealed chronic lung changes.  He received azithromycin, cefepime, vancomycin, and duoneb x1.  PCCM team contacted for ICU admission.    Pertinent  Medical History  COPD OSA Morbid Obesity Hypertension Hyperlipidemia GERD Depression T8 Vertebral Fracture Subarachnoid Hemorrhage  Chronic Tracheostomy  Significant Hospital Events: Including procedures, antibiotic start and stop dates in addition to other pertinent events   Pt admitted to ICU on mechanical ventilation via chronic tracheostomy   Interim History / Subjective:  -Pt admitted with severe respiratory distress  requiring mechanical ventilation via chronic tracheostomy -Remains tachypneic despite mechanical ventilation   Objective   Blood pressure (!) 133/92, pulse (!) 118, temperature 99.1 F (37.3 C), temperature source Oral, resp. rate (!) 38, height '5\' 10"'  (1.778 m), weight 136.1 kg, SpO2 91 %.    FiO2 (%):  [100 %] 100 %  No intake or output data in the 24 hours ending 04/25/21 1016 Filed Weights   04/25/21 0842  Weight: 136.1 kg   Examination: General: acutely ill appearing male, in respiratory distress mechanically ventilated via tracheostomy  HENT: supple, chronic tracheostomy midline dressing dry/intact Lungs: diminished throughout, tachypneic, labored  Cardiovascular: sinus tach, s1s2, no R/G Abdomen: hypoactive BS x4, soft, obese, non tender, non distended  Extremities: trace bilateral lower extremity edema, normal tone  Neuro: lethargic, follows commands, PERRL GU: orders placed for external catheter placement   Resolved Hospital Problem list     Assessment & Plan:  Acute on chronic hypercapnic respiratory failure secondary to AECOPD amd Obesity Hypoventilation Syndrome Mechanical Ventilation via Chronic Tracheostomy  Hx: Pulmonary HTN, Morbid Obesity  -Full vent support, implement lung protective strategies -Will need nocturnal mechanical ventilation AT ALL TIMES  -Wean FiO2 & PEEP as tolerated to maintain O2 sats 88 to 92% -Follow intermittent Chest X-ray & ABG as needed -Spontaneous Breathing Trials/ Trach collar trials when respiratory parameters met and mental status permits -Implement VAP Bundle -Scheduled and prn bronchodilator therapy; nebulized steroids  -Trend WBC and monitor fever curve -Will check PCT~if elevated will continue empiric abx  -Follow cultures   HTN  Hyperlipidemia  -Continuous telemetry monitoring  -Continue outpatient prn metoprolol for bp management   Hyperglycemia  -CBG's q4hrs  -SSI  Best Practice (right click and "Reselect all  SmartList Selections" daily)   Diet/type: NPO; if pt remains mechanical ventilated via tracheostomy in the next 24hrs will place order for NG tube for TF's  DVT prophylaxis: LMWH GI prophylaxis: H2B Lines: N/A Foley:  N/A Code Status:  DNR Last date of multidisciplinary goals of care discussion [04/25/2021]  Labs   CBC: Recent Labs  Lab 04/25/21 0841  WBC 13.4*  HGB 14.2  HCT 43.8  MCV 100.2*  PLT 696    Basic Metabolic Panel: Recent Labs  Lab 04/25/21 0841  NA 141  K 4.5  CL 95*  CO2 35*  GLUCOSE 172*  BUN 14  CREATININE 0.78  CALCIUM 9.2   GFR: Estimated Creatinine Clearance: 134.7 mL/min (by C-G formula based on SCr of 0.78 mg/dL). Recent Labs  Lab 04/25/21 0841 04/25/21 0845  WBC 13.4*  --   LATICACIDVEN  --  1.8    Liver Function Tests: Recent Labs  Lab 04/25/21 0841  AST 35  ALT 26  ALKPHOS 63  BILITOT 0.9  PROT 7.4  ALBUMIN 3.4*   No results for input(s): LIPASE, AMYLASE in the last 168 hours. No results for input(s): AMMONIA in the last 168 hours.  ABG    Component Value Date/Time   PHART 7.40 04/04/2021 1153   PCO2ART 87 (HH) 04/04/2021 1153   PO2ART 90 04/04/2021 1153   HCO3 44.5 (H) 04/25/2021 0944   O2SAT 87.4 04/25/2021 0944     Coagulation Profile: No results for input(s): INR, PROTIME in the last 168 hours.  Cardiac Enzymes: No results for input(s): CKTOTAL, CKMB, CKMBINDEX, TROPONINI in the last 168 hours.  HbA1C: Hgb A1c MFr Bld  Date/Time Value Ref Range Status  03/02/2021 04:52 AM 5.9 (H) 4.8 - 5.6 % Final    Comment:    (NOTE)         Prediabetes: 5.7 - 6.4         Diabetes: >6.4         Glycemic control for adults with diabetes: <7.0     CBG: No results for input(s): GLUCAP in the last 168 hours.  Review of Systems: Positives in BOLD   Gen: Denies fever, chills, weight change, fatigue, night sweats HEENT: Denies blurred vision, double vision, hearing loss, tinnitus, sinus congestion, rhinorrhea, sore  throat, neck stiffness, dysphagia PULM: shortness of breath, cough, sputum production, hemoptysis, wheezing CV: Denies chest pain, edema, orthopnea, paroxysmal nocturnal dyspnea, palpitations GI: Denies abdominal pain, nausea, vomiting, diarrhea, hematochezia, melena, constipation, change in bowel habits GU: Denies dysuria, hematuria, polyuria, oliguria, urethral discharge Endocrine: Denies hot or cold intolerance, polyuria, polyphagia or appetite change Derm: Denies rash, dry skin, scaling or peeling skin change Heme: Denies easy bruising, bleeding, bleeding gums Neuro: Denies headache, numbness, weakness, slurred speech, loss of memory or consciousness  Past Medical History:  He,  has a past medical history of Adenomatous colon polyp, Depression, Ear drum perforation, GERD (gastroesophageal reflux disease), Hyperlipidemia, Hypertension, and T8 vertebral fracture ([place]).   Surgical History:   Past Surgical History:  Procedure Laterality Date   CARPAL TUNNEL RELEASE     left hand   COLONOSCOPY  09/18/2009, 09/15/2014   COLONOSCOPY WITH PROPOFOL N/A 12/29/2017   Procedure: COLONOSCOPY WITH PROPOFOL;  Surgeon: Manya Silvas, MD;  [facility]: [facility] ENDOSCOPY;  Service: Endoscopy;  Laterality: N/A;   EYE SURGERY     FRACTURE SURGERY     HERNIA REPAIR     LIPOMA EXCISION  SPINE SURGERY     TRACHEOSTOMY TUBE PLACEMENT N/A 03/07/2021   Procedure: TRACHEOSTOMY;  Surgeon: Margaretha Sheffield, MD;  Location: [facility] ORS;  Service: ENT;  Laterality: N/A;     Social History:   reports that he has never smoked. He has never used smokeless tobacco. He reports that he does not drink alcohol and does not use drugs.   Family History:  His family history includes Cancer in his mother; Varicose Veins in his mother.   Allergies Allergies  Allergen Reactions   Divalproex Sodium Other (See Comments)    Elevated liver enzymes   Valproic Acid     unknown     Home Medications  Prior to Admission  medications   Medication Sig Start Date End Date Taking? Authorizing Provider  ARIPiprazole (ABILIFY) 5 MG tablet Take 1 tablet (5 mg total) by mouth daily. 04/06/21  Yes Darel Hong D, NP  ascorbic acid (VITAMIN C) 250 MG tablet Take 1 tablet (250 mg total) by mouth 2 (two) times daily. 04/05/21   Bradly Bienenstock, NP  bisacodyl (DULCOLAX) 10 MG suppository Place 1 suppository (10 mg total) rectally daily as needed for moderate constipation. 04/05/21   Bradly Bienenstock, NP  Chlorhexidine Gluconate Cloth 2 % PADS Apply 6 each topically daily. 04/06/21   Bradly Bienenstock, NP  chlorhexidine gluconate, MEDLINE KIT, (PERIDEX) 0.12 % solution 15 mLs by Mouth Rinse route 2 (two) times daily. 04/05/21   Bradly Bienenstock, NP  docusate (COLACE) 50 MG/5ML liquid Place 10 mLs (100 mg total) into feeding tube 2 (two) times daily. 04/05/21   Bradly Bienenstock, NP  docusate sodium (COLACE) 100 MG capsule Take 1 capsule (100 mg total) by mouth 2 (two) times daily as needed for mild constipation. 04/05/21   Bradly Bienenstock, NP  enoxaparin (LOVENOX) 80 MG/0.8ML injection Inject 0.75 mLs (75 mg total) into the skin daily. 04/05/21   Bradly Bienenstock, NP  feeding supplement (ENSURE ENLIVE / ENSURE PLUS) LIQD Take 237 mLs by mouth 3 (three) times daily between meals. 04/05/21   Bradly Bienenstock, NP  insulin aspart (NOVOLOG) 100 UNIT/ML injection Inject 0-15 Units into the skin 4 (four) times daily -  before meals and at bedtime. 04/05/21   Darel Hong D, NP  ipratropium-albuterol (DUONEB) 0.5-2.5 (3) MG/3ML SOLN Take 3 mLs by nebulization 3 (three) times daily. 04/05/21   Bradly Bienenstock, NP  metoprolol tartrate (LOPRESSOR) 5 MG/5ML SOLN injection Inject 5 mLs (5 mg total) into the vein every 6 (six) hours. 04/05/21   Bradly Bienenstock, NP  oxyCODONE 10 MG TABS Place 1 tablet (10 mg total) into feeding tube every 6 (six) hours as needed for moderate pain. 04/05/21   Bradly Bienenstock, NP  polyethylene glycol (MIRALAX / GLYCOLAX)  17 g packet Take 17 g by mouth daily. 04/06/21   Bradly Bienenstock, NP  polyvinyl alcohol (LIQUIFILM TEARS) 1.4 % ophthalmic solution Place 1 drop into both eyes as needed for dry eyes. 04/05/21   Bradly Bienenstock, NP  senna-docusate (SENOKOT-S) 8.6-50 MG tablet Take 1 tablet by mouth 2 (two) times daily. 04/05/21   Darel Hong D, NP  sodium chloride 0.9 % infusion Inject 250 mLs into the vein continuous. 04/05/21   Bradly Bienenstock, NP     Critical care time: 40 minutes     Placed transition of care consult: following discussion with pts significant other Bubba Hales at bedside it does appear she is concerned about caring  for patient at home. She also states she DOES NOT know how to take care of pts chronic tracheostomy.  Rosilyn Mings, AGNP  Pulmonary/Critical Care Pager [phone] (please enter 7 digits) PCCM Consult Pager [phone] (please enter 7 digits)

## 2021-04-25 NOTE — ED Triage Notes (Signed)
Pt to ED [facility] from home for shob, resp distress. Ems reports pt just d/c from [facility] yesterday, family at home does not know how to take care of trach.  Pt 85% on RA, placed on 10L EMS, pt diaphoretic. 20G to left hand, 1 duoneb, 125 solumedrol given PTA Pt alert and oriented.

## 2021-04-25 NOTE — ED Notes (Signed)
Called Respiratory and advised to come check vent, was advised that it was going to be changed

## 2021-04-25 NOTE — ED Notes (Signed)
RT contacted to add on VBG

## 2021-04-25 NOTE — ED Notes (Signed)
Report given to ICU / Respiratory called

## 2021-04-25 NOTE — ED Notes (Signed)
Contacted ICU for report

## 2021-04-25 NOTE — ED Notes (Signed)
ICU PA at bedside speaking with pt and spouse

## 2021-04-26 DIAGNOSIS — J9601 Acute respiratory failure with hypoxia: Secondary | ICD-10-CM

## 2021-04-26 DIAGNOSIS — J9602 Acute respiratory failure with hypercapnia: Secondary | ICD-10-CM

## 2021-04-26 DIAGNOSIS — J9622 Acute and chronic respiratory failure with hypercapnia: Secondary | ICD-10-CM | POA: Diagnosis not present

## 2021-04-26 LAB — GLUCOSE, CAPILLARY
Glucose-Capillary: 122 mg/dL — ABNORMAL HIGH (ref 70–99)
Glucose-Capillary: 123 mg/dL — ABNORMAL HIGH (ref 70–99)
Glucose-Capillary: 111 mg/dL — ABNORMAL HIGH (ref 70–99)
Glucose-Capillary: 155 mg/dL — ABNORMAL HIGH (ref 70–99)
Glucose-Capillary: 101 mg/dL — ABNORMAL HIGH (ref 70–99)
Glucose-Capillary: 112 mg/dL — ABNORMAL HIGH (ref 70–99)

## 2021-04-26 LAB — BASIC METABOLIC PANEL
Anion gap: 13 (ref 5–15)
BUN: 20 mg/dL (ref 8–23)
CO2: 34 mmol/L — ABNORMAL HIGH (ref 22–32)
Calcium: 9.3 mg/dL (ref 8.9–10.3)
Chloride: 94 mmol/L — ABNORMAL LOW (ref 98–111)
Creatinine, Ser: 0.85 mg/dL (ref 0.61–1.24)
GFR, Estimated: >60 mL/min (ref >60)
Glucose, Bld: 127 mg/dL — ABNORMAL HIGH (ref 70–99)
Potassium: 4.1 mmol/L (ref 3.5–5.1)
Sodium: 141 mmol/L (ref 135–145)

## 2021-04-26 LAB — CBC
HCT: 36.5 % — ABNORMAL LOW (ref 39.0–52.0)
Hemoglobin: 12.2 g/dL — ABNORMAL LOW (ref 13.0–17.0)
MCH: 33.1 pg (ref 26.0–34.0)
MCHC: 33.4 g/dL (ref 30.0–36.0)
MCV: 98.9 fL (ref 80.0–100.0)
Platelets: 189 10*3/uL (ref 150–400)
RBC: 3.69 MIL/uL — ABNORMAL LOW (ref 4.22–5.81)
RDW: 13.6 % (ref 11.5–15.5)
WBC: 10.8 10*3/uL — ABNORMAL HIGH (ref 4.0–10.5)
nRBC: 0 % (ref 0.0–0.2)

## 2021-04-26 LAB — PHOSPHORUS: Phosphorus: 4 mg/dL (ref 2.5–4.6)

## 2021-04-26 LAB — MAGNESIUM: Magnesium: 1.8 mg/dL (ref 1.7–2.4)

## 2021-04-26 MED ORDER — METOPROLOL TARTRATE 25 MG PO TABS
25.0000 mg | ORAL_TABLET | Freq: Two times a day (BID) | ORAL | Status: DC
  Administered 2021-04-26 – 2021-05-02 (×12): 25 mg via ORAL
  Filled 2021-04-26 (×12): qty 1

## 2021-04-26 NOTE — Progress Notes (Signed)
Patients heart rate 130-140's. Dr. Mortimer Fries notified. Patient states he is not in any distress. Per MD will order metoprolol. No additional orders. Continue to assess.

## 2021-04-26 NOTE — Evaluation (Addendum)
Clinical/Bedside Swallow Evaluation Patient Details  Name: Eddie Chapman MRN: [id number] Date of Birth: 02/23/1961  Today's Date: 04/26/2021 Time: SLP Start Time (ACUTE ONLY): 1300 SLP Stop Time (ACUTE ONLY): 1445 SLP Time Calculation (min) (ACUTE ONLY): 105 min  Past Medical History:  Past Medical History:  Diagnosis Date   Adenomatous colon polyp    Depression    Ear drum perforation    left x2    GERD (gastroesophageal reflux disease)    Hyperlipidemia    Hypertension    T8 vertebral fracture [facility])    Past Surgical History:  Past Surgical History:  Procedure Laterality Date   CARPAL TUNNEL RELEASE     left hand   COLONOSCOPY  09/18/2009, 09/15/2014   COLONOSCOPY WITH PROPOFOL N/A 12/29/2017   Procedure: COLONOSCOPY WITH PROPOFOL;  Surgeon: Manya Silvas, MD;  Location: [facility] ENDOSCOPY;  Service: Endoscopy;  Laterality: N/A;   EYE SURGERY     FRACTURE SURGERY     HERNIA REPAIR     LIPOMA EXCISION     SPINE SURGERY     TRACHEOSTOMY TUBE PLACEMENT N/A 03/07/2021   Procedure: TRACHEOSTOMY;  Surgeon: Margaretha Sheffield, MD;  Location: [facility] ORS;  Service: ENT;  Laterality: N/A;   HPI:  This is a 60 yo male with complex comorbid history including systolic CHF chronically, pulmonary hypertension, obstructive sleep apnea, obesity hypoventilation syndrome, chronic tracheostomy, and COPD.     He presented to [facility] ER on 07/27 via EMS from home with c/o shortness of breath.  He was discharged from [facility] to [place] on 07/7 following prolonged hospitalization due to acute on chronic hypoxic hypercapnic respiratory failure secondary to acinotobacter species pneumonia and AECOPD requiring tracheostomy.  He was discharged from [facility] to home on 07/26.  Per ED notes pts family reports they DO NOT know how to take care of his tracheostomy at home.  EMS reported upon their arrival at pts home pt diaphoretic with O2 sats 85% on RA and diaphoretic, therefore he was placed on 10L of O2 and received 125 mg  solumedrol.  He remained alert and oriented.     ED Course  Upon arrival to the ER pt noted to have increased work of breathing despite 10L O2 via trach collar.  VBG revealed pH 7.27/CO2 97/acid-base excess 12.5/bicarb 44.5.  He was therefore placed on mechanical ventilation via chronic tracheostomy.  CXR revealed chronic lung changes.  He received azithromycin, cefepime, vancomycin, and duoneb x1.  PCCM team contacted for ICU admission.   Assessment / Plan / Recommendation Clinical Impression  Pt seen for both PMV assessment and BSE assessment during this session today in order to establish and educate on wear of the PMV as well as establish least restrictive oral diet for pt.  He tolerated PMV placement and wear adequately w/ no discomfort or O2 desat. Similar presentation as during previous admit.  Pt consumed po trials w/ no overt s/s of aspiration noted w/ any consistency; he required min verbal cues for follow through w/ general aspiration precautions. Again, similar presentation as during previous admit.  Recommend Mech Soft diet w/ thin liquids w/ general aspiration precautions -- MUST wear PMV for all oral intake. MUST NOT wear PMV when sleeping.  Precautions posted re: PMV use/wear and aspiration precautions in pt's room. NSG and pt/family updated.  PMV assessment:  Pt's wear/use of Passy-muir valve(PMV) appears appropriate and at/close to his baseline from previous admit. Pt's trach is a Shiley #6, XLT, Cuffed - pt is on nocturnal  vent support and will continue w/ such per MD and will require a Cuffed trach. He has tolerated trach collar O2 support at 28% FiO2 per NSG/RT. Tracheal secretions minimal w/ PRN suctioning. Pt resting in bed. Respiratory status appears comfortable while wearing the PMV baseline - RR 19-27, O2 sats 98-99%, HR 88-95. Pt able to verbalize appropriately w/ strong vocal quality, less gravely. Adequate breath support noted. Pt verbalized his wants/needs; noted easily  distracted as at prior admit.   BSE: Pt appears to present w/ adequate oropharyngeal phase swallow function w/ No oropharyngeal phase dysphagia noted, no neuromuscular deficits noted w/ the trials given. He consumed all consistencies provided w/ No overt, clinical s/s of aspiration during po trials. Pt appears at reduced risk for aspiration following general aspiration precautions. Pt consumed the trials w/ no overt coughing, decline in vocal quality, or change in respiratory presentation during/post trials from his Baseline. Oral phase appeared [facility] w/ timely bolus management, mastication, and control of bolus propulsion for A-P transfer for swallowing. Oral clearing achieved. OM Exam appeared [facility] w/ no unilateral weakness noted. Speech Clear though w/ min decreased breath support during trials. Pt fed self w/ setup support.   Recommend continued ST f/u for ongoing education x1-2 sessions; toleration of both PMV use/wear and his oral diet as ordered.  NSG and MD updated on POC. ST services will f/u during admit.  SLP Visit Diagnosis: Dysphagia, unspecified (R13.10);Aphonia (R49.1) (tracheostomy)    Aspiration Risk  Mild aspiration risk;Risk for inadequate nutrition/hydration (reduced when following general precautions)    Diet Recommendation Mech Soft diet consistency w/ thin liquids via cup/straw -- general aspiration precautions; monitor and support pt during meals as needed. MUST WEAR the PMV during all oral intake. General Reflux precautions. Reduce distractions during meals.  Medication Administration: Whole meds with puree (for safer swallowing)    Other  Recommendations Recommended Consults:  (Dietician f/u) Oral Care Recommendations: Oral care BID;Oral care before and after PO;Patient independent with oral care (support) Other Recommendations:  (n/a)   Follow up Recommendations  (TBD by PT)      Frequency and Duration min 2x/week  1 week       Prognosis Prognosis for Safe Diet  Advancement: Good Barriers to Reach Goals: Cognitive deficits;Time post onset;Severity of deficits;Behavior Barriers/Prognosis Comment: chronic tracheostomy      Swallow Study   General Date of Onset: 04/25/21 HPI: This is a 60 yo male with complex comorbid history including systolic CHF chronically, pulmonary hypertension, obstructive sleep apnea, obesity hypoventilation syndrome, chronic tracheostomy, and COPD.     He presented to [facility] ER on 07/27 via EMS from home with c/o shortness of breath.  He was discharged from [facility] to [place] on 07/7 following prolonged hospitalization due to acute on chronic hypoxic hypercapnic respiratory failure secondary to acinotobacter species pneumonia and AECOPD requiring tracheostomy.  He was discharged from [facility] to home on 07/26.  Per ED notes pts family reports they DO NOT know how to take care of his tracheostomy at home.  EMS reported upon their arrival at pts home pt diaphoretic with O2 sats 85% on RA and diaphoretic, therefore he was placed on 10L of O2 and received 125 mg solumedrol.  He remained alert and oriented.     ED Course  Upon arrival to the ER pt noted to have increased work of breathing despite 10L O2 via trach collar.  VBG revealed pH 7.27/CO2 97/acid-base excess 12.5/bicarb 44.5.  He was therefore placed on mechanical ventilation via  chronic tracheostomy.  CXR revealed chronic lung changes.  He received azithromycin, cefepime, vancomycin, and duoneb x1.  PCCM team contacted for ICU admission. Type of Study: Bedside Swallow Evaluation Previous Swallow Assessment: previous admit -- see notes in chart Diet Prior to this Study: Dysphagia 3 (soft);Thin liquids (at previous Rehab facility - [facility]) Temperature Spikes Noted: No (wbc 10.8 declining) Respiratory Status: Trach Collar (10L on TC O2) Trach Size and Type: Cuff;#6 History of Recent Intubation: No (chronic trach) Behavior/Cognition: Alert;Cooperative;Pleasant mood;Distractible;Requires  cueing (baseline distraction/anxiousness - TBI in 2016) Oral Cavity Assessment: Within Functional Limits Oral Care Completed by SLP: Yes Oral Cavity - Dentition: Adequate natural dentition Vision: Functional for self-feeding Self-Feeding Abilities: Able to feed self;Needs assist;Needs set up Patient Positioning: Upright in bed (needed support) Baseline Vocal Quality: Normal (min reduced breath support for speech; choppy) Volitional Cough: Strong Volitional Swallow: Able to elicit    Oral/Motor/Sensory Function Overall Oral Motor/Sensory Function: Within functional limits   Ice Chips Ice chips: Within functional limits Presentation: Spoon (fed; 5 trials)   Thin Liquid Thin Liquid: Within functional limits Presentation: Cup;Self Fed;Straw (~16 ozs total) Other Comments: water, juice, milk    Nectar Thick Nectar Thick Liquid: Not tested   Honey Thick Honey Thick Liquid: Not tested   Puree Puree: Within functional limits Presentation: Self Fed;Spoon (4 ozs)   Solid     Solid: Within functional limits (soft) Presentation: Self Fed;Spoon (8 trials)         Orinda Kenner, MS, [place] Speech Language Pathologist Rehab Services [phone] Bebe Moncure 04/26/2021,7:10 PM

## 2021-04-26 NOTE — Progress Notes (Signed)
NAME:  Eddie Chapman, MRN:  [id number], DOB:  1960/10/30, LOS: 1 ADMISSION DATE:  04/25/2021, CONSULTATION DATE:04/25/2021 REFERRING MD: Dr. Quentin Cornwall, CHIEF COMPLAINT:  Shortness of Breath  History of Present Illness:  This is a 60 yo male with complex comorbid history including systolic CHF chronically, pulmonary hypertension, obstructive sleep apnea, obesity hypoventilation syndrome, chronic tracheostomy, and COPD.  He presented to [facility] ER on 07/27 via EMS from home with c/o shortness of breath.  He was discharged from [facility] to [place] on 07/7 following prolonged hospitalization due to acute on chronic hypoxic hypercapnic respiratory failure secondary to acinotobacter species pneumonia and AECOPD requiring tracheostomy.  He was discharged from [facility] to home on 07/26.  Per ED notes pts family reports they DO NOT know how to take care of his tracheostomy at home.  EMS reported upon their arrival at pts home pt diaphoretic with O2 sats 85% on RA and diaphoretic, therefore he was placed on 10L of O2 and received 125 mg solumedrol.  He remained alert and oriented.   ED Course Upon arrival to the ER pt noted to have increased work of breathing despite 10L O2 via trach collar.  VBG revealed pH 7.27/CO2 97/acid-base excess 12.5/bicarb 44.5.  He was therefore placed on mechanical ventilation via chronic tracheostomy.  CXR revealed chronic lung changes.  He received azithromycin, cefepime, vancomycin, and duoneb x1.  PCCM team contacted for ICU admission.    Pertinent  Medical History  COPD OSA Morbid Obesity Hypertension Hyperlipidemia GERD Depression T8 Vertebral Fracture Subarachnoid Hemorrhage  Chronic Tracheostomy  Significant Hospital Events: Including procedures, antibiotic start and stop dates in addition to other pertinent events   Pt admitted to ICU on mechanical ventilation via chronic tracheostomy     Interim History / Subjective:   Fio2 at 50% Plan to wean to PS mode Needs  vent to stay alive now Resp failure on vent Patient is awake    Objective   Blood pressure 112/71, pulse 96, temperature 98.7 F (37.1 C), resp. rate (!) 24, height '5\' 10"'$  (1.778 m), weight (!) 145.8 kg, SpO2 97 %.    Vent Mode: Spontaneous FiO2 (%):  [40 %-100 %] 40 % Set Rate:  [16 bmp-20 bmp] 20 bmp Vt Set:  [500 mL] 500 mL PEEP:  [5 cmH20] 5 cmH20 Pressure Support:  [5 cmH20] 5 cmH20   Intake/Output Summary (Last 24 hours) at 04/26/2021 [id number] Last data filed at 04/25/2021 2248 Gross per 24 hour  Intake 641.06 ml  Output --  Net 641.06 ml   Filed Weights   04/25/21 0842 04/26/21 0500  Weight: 136.1 kg (!) 145.8 kg     Review of Systems: +SOB Alert and awake Other:  All other systems negative  PHYSICAL EXAMINATION:  GENERAL: ill appearing, +resp distress on vent HEAD: Normocephalic, atraumatic.  EYES: Pupils equal, round, reactive to light.  No scleral icterus.  MOUTH: Moist mucosal membrane. NECK: Supple. S/p trach PULMONARY: +rhonchi CARDIOVASCULAR: S1 and S2. Regular rate and rhythm. No murmurs, rubs, or gallops.  GASTROINTESTINAL: Soft, nontender, -distended. Positive bowel sounds.  MUSCULOSKELETAL: No swelling, clubbing, or edema.  NEUROLOGIC: alert and awake SKIN:intact,warm,dry      Assessment & Plan:  Severe hypercapnic resp failure  secondary to AECOPD amd Obesity Hypoventilation Syndrome Mechanical Ventilation via Chronic Tracheostomy  Hx: Pulmonary HTN, Morbid Obesity  Wean to PS mode Attempt TCT  SEVERE COPD EXACERBATION -continue IV steroids as prescribed -continue NEB THERAPY as prescribed -morphine as needed -wean fio2 as needed and  tolerated  ELECTROLYTES -follow labs as needed -replace as needed -pharmacy consultation and following      Best Practice (right click and "Reselect all SmartList Selections" daily)   Diet/type: NPO; if pt remains mechanical ventilated via tracheostomy in the next 24hrs will place order for NG tube  for TF's  DVT prophylaxis: LMWH GI prophylaxis: H2B Lines: N/A Foley:  N/A Code Status:  DNR Last date of multidisciplinary goals of care discussion [04/25/2021]  Labs   CBC: Recent Labs  Lab 04/25/21 0841 04/26/21 0617  WBC 13.4* 10.8*  HGB 14.2 12.2*  HCT 43.8 36.5*  MCV 100.2* 98.9  PLT 211 189     Basic Metabolic Panel: Recent Labs  Lab 04/25/21 0841 04/26/21 0617  NA 141 141  K 4.5 4.1  CL 95* 94*  CO2 35* 34*  GLUCOSE 172* 127*  BUN 14 20  CREATININE 0.78 0.85  CALCIUM 9.2 9.3  MG 2.1 1.8  PHOS 4.5 4.0    GFR: Estimated Creatinine Clearance: 131.8 mL/min (by C-G formula based on SCr of 0.85 mg/dL). Recent Labs  Lab 04/25/21 0841 04/25/21 0845 04/26/21 0617  PROCALCITON <0.10  --   --   WBC 13.4*  --  10.8*  LATICACIDVEN  --  1.8  --      Liver Function Tests: Recent Labs  Lab 04/25/21 0841  AST 35  ALT 26  ALKPHOS 63  BILITOT 0.9  PROT 7.4  ALBUMIN 3.4*    ABG    Component Value Date/Time   PHART 7.55 (H) 04/25/2021 1652   PCO2ART 44 04/25/2021 1652   PO2ART 83 04/25/2021 1652   HCO3 38.5 (H) 04/25/2021 1652   O2SAT 97.4 04/25/2021 1652        DVT/GI PRX  assessed I Assessed the need for Labs I Assessed the need for Foley I Assessed the need for Central Venous Line Family Discussion when available I Assessed the need for Mobilization I made an Assessment of medications to be adjusted accordingly Safety Risk assessment completed  CASE DISCUSSED IN MULTIDISCIPLINARY ROUNDS WITH ICU TEAM     Critical Care Time devoted to patient care services described in this note is 40 minutes.  Critical care was necessary to treat /prevent imminent and life-threatening deterioration.   Corrin Parker, M.D.  [facility]  Medical Director [place] Director [facility] Cardio-Pulmonary Department

## 2021-04-26 NOTE — Progress Notes (Signed)
Pt taken off vent and placed on Trach Collar, tolerating well at this time. RN notified.

## 2021-04-27 DIAGNOSIS — J9622 Acute and chronic respiratory failure with hypercapnia: Secondary | ICD-10-CM | POA: Diagnosis not present

## 2021-04-27 DIAGNOSIS — E662 Morbid (severe) obesity with alveolar hypoventilation: Secondary | ICD-10-CM | POA: Diagnosis not present

## 2021-04-27 LAB — GLUCOSE, CAPILLARY
Glucose-Capillary: 89 mg/dL (ref 70–99)
Glucose-Capillary: 97 mg/dL (ref 70–99)
Glucose-Capillary: 153 mg/dL — ABNORMAL HIGH (ref 70–99)
Glucose-Capillary: 150 mg/dL — ABNORMAL HIGH (ref 70–99)
Glucose-Capillary: 117 mg/dL — ABNORMAL HIGH (ref 70–99)

## 2021-04-27 MED ORDER — POLYVINYL ALCOHOL 1.4 % OP SOLN
1.0000 [drp] | OPHTHALMIC | Status: DC | PRN
  Filled 2021-04-27: qty 15

## 2021-04-27 MED ORDER — OXYCODONE HCL 5 MG PO TABS
5.0000 mg | ORAL_TABLET | Freq: Four times a day (QID) | ORAL | Status: DC | PRN
Start: 2021-04-27 — End: 2021-04-27
  Filled 2021-04-27: qty 1

## 2021-04-27 MED ORDER — BISACODYL 10 MG RE SUPP: 10.0000 mg | Freq: Every day | RECTAL | Status: DC | PRN

## 2021-04-27 MED ORDER — INSULIN ASPART 100 UNIT/ML IJ SOLN: 0.0000 [IU] | Freq: Every day | INTRAMUSCULAR | Status: DC

## 2021-04-27 MED ORDER — INSULIN ASPART 100 UNIT/ML IJ SOLN
0.0000 [IU] | Freq: Three times a day (TID) | INTRAMUSCULAR | Status: DC
  Administered 2021-04-27: 3 [IU] via SUBCUTANEOUS
  Administered 2021-04-27 – 2021-04-30 (×6): 2 [IU] via SUBCUTANEOUS
  Administered 2021-04-30 – 2021-05-01 (×4): 3 [IU] via SUBCUTANEOUS
  Administered 2021-05-02: 2 [IU] via SUBCUTANEOUS
  Administered 2021-05-02 (×2): 3 [IU] via SUBCUTANEOUS
  Administered 2021-05-03: 2 [IU] via SUBCUTANEOUS
  Administered 2021-05-03: 3 [IU] via SUBCUTANEOUS
  Administered 2021-05-03 – 2021-05-04 (×2): 2 [IU] via SUBCUTANEOUS
  Administered 2021-05-04: 8 [IU] via SUBCUTANEOUS
  Filled 2021-04-27 (×20): qty 1

## 2021-04-27 MED ORDER — MAGNESIUM SULFATE 2 GM/50ML IV SOLN
2.0000 g | Freq: Once | INTRAVENOUS | Status: AC
  Administered 2021-04-27: 2 g via INTRAVENOUS
  Filled 2021-04-27: qty 50

## 2021-04-27 MED ORDER — FAMOTIDINE 20 MG PO TABS
20.0000 mg | ORAL_TABLET | Freq: Two times a day (BID) | ORAL | Status: DC
  Administered 2021-04-27 – 2021-05-04 (×14): 20 mg via ORAL
  Filled 2021-04-27 (×14): qty 1

## 2021-04-27 MED ORDER — IPRATROPIUM-ALBUTEROL 0.5-2.5 (3) MG/3ML IN SOLN
3.0000 mL | Freq: Three times a day (TID) | RESPIRATORY_TRACT | Status: DC
  Administered 2021-04-27 – 2021-05-04 (×21): 3 mL via RESPIRATORY_TRACT
  Filled 2021-04-27 (×21): qty 3

## 2021-04-27 MED ORDER — OXYCODONE HCL 5 MG PO TABS
5.0000 mg | ORAL_TABLET | Freq: Four times a day (QID) | ORAL | Status: DC | PRN
  Administered 2021-04-27 – 2021-04-28 (×4): 5 mg via ORAL
  Administered 2021-04-29 – 2021-05-01 (×5): 10 mg via ORAL
  Administered 2021-05-02 (×2): 5 mg via ORAL
  Administered 2021-05-03: 10 mg via ORAL
  Administered 2021-05-04: 5 mg via ORAL
  Filled 2021-04-27 (×4): qty 2
  Filled 2021-04-27 (×2): qty 1
  Filled 2021-04-27: qty 2
  Filled 2021-04-27 (×4): qty 1
  Filled 2021-04-27: qty 2
  Filled 2021-04-27: qty 1

## 2021-04-27 MED ORDER — ARIPIPRAZOLE 5 MG PO TABS
5.0000 mg | ORAL_TABLET | Freq: Every day | ORAL | Status: DC
  Administered 2021-04-27 – 2021-05-04 (×8): 5 mg via ORAL
  Filled 2021-04-27 (×8): qty 1

## 2021-04-27 NOTE — Evaluation (Signed)
Physical Therapy Evaluation Patient Details Name: Eddie Chapman MRN: [id number] DOB: 06/10/1961 Today's Date: 04/27/2021   History of Present Illness  Pt is a 60 y/o M who presented to the ED on 04/25/21 with c/c of SOB. Pt was discharged from [facility] to [place] on 04/05/21 following prolonged hospitalization 2/2 acute on chronic hypoxic hypercapnic respiratory failure 2/2 acinotobacter species pneumonia & AECOPD requiring tracheostomy. Pt was d/c home from [place] on 04/24/21. PMH: systolic CHF, pulmonary HTN, OSA, obesity hypoventilation syndrome, chronic trach, COPD, depression, HLD, T8 vertebral fx  Clinical Impression  Pt seen for PT evaluation with girlfriend Eddie Chapman) present. Pt is able to complete bed mobility with supervision but requires mod assist +2 HHA for stand pivot bed>recliner. Pt performs seated & standing BLE strengthening exercises & progresses to taking a few steps forwards + backwards repeatedly next to EOB. Pt with increased labored breathing but SpO2 >90%. Pt would benefit from CIR upon d/c to maximize independence with mobility & reduce fall risk prior to return home.     Follow Up Recommendations CIR    Equipment Recommendations   (TBD in next venue)    Recommendations for Other Services       Precautions / Restrictions Precautions Precautions: Fall Precaution Comments: trach collar, PMV Restrictions Weight Bearing Restrictions: No      Mobility  Bed Mobility Overal bed mobility: Needs Assistance Bed Mobility: Supine to Sit     Supine to sit: Min guard;HOB elevated     General bed mobility comments: use of bed rails, heavy reliance on bed rails    Transfers Overall transfer level: Needs assistance Equipment used: Rolling walker (2 wheeled) Transfers: Sit to/from [facility] Sit to Stand:  (mod assist +2 HHA fade to min assist +1 with RW) Stand pivot transfers: Mod assist;+2 physical assistance (BUE HHA)           Ambulation/Gait Ambulation/Gait assistance: Min assist Gait Distance (Feet):  (2 ft forwards + backwards x 3 (~11 ft)) Assistive device: Rolling walker (2 wheeled) Gait Pattern/deviations: Decreased step length - right;Decreased step length - left;Decreased stride length;Decreased dorsiflexion - right;Decreased dorsiflexion - left Gait velocity: decreased      Stairs            Wheelchair Mobility    Modified Rankin (Stroke Patients Only)       Balance Overall balance assessment: Needs assistance Sitting-balance support: Feet supported Sitting balance-Leahy Scale: Fair Sitting balance - Comments: supervision sitting EOB   Standing balance support: Bilateral upper extremity supported;During functional activity Standing balance-Leahy Scale: Poor Standing balance comment: requires BUE support for standing                             Pertinent Vitals/Pain Pain Assessment: No/denies pain    Home Living Family/patient expects to be discharged to:: Private residence Living Arrangements: Spouse/significant other Available Help at Discharge: Family;Available 24 hours/day Type of Home: House Home Access: Stairs to enter Entrance Stairs-Rails: None Entrance Stairs-Number of Steps: 2 Home Layout: One level Home Equipment: Walker - 2 wheels;Bedside commode (rollator) Additional Comments: Eddie Chapman (girlfriend) reports BSC is too narrow    Prior Function           Comments: Pt recently d/c from [facility] to home. Eddie Chapman (girlfriend) reports pt took non emergent EMS transport home & hadn't mobilized with her, but was ambulatory with +2 assist at [place].     Hand Dominance  Extremity/Trunk Assessment   Upper Extremity Assessment Upper Extremity Assessment: Generalized weakness    Lower Extremity Assessment Lower Extremity Assessment: Generalized weakness       Communication   Communication: Tracheostomy  Cognition Arousal/Alertness:  Awake/alert Behavior During Therapy: WFL for tasks assessed/performed Overall Cognitive Status: Within Functional Limits for tasks assessed                         Following Commands: Follows one step commands consistently              General Comments General comments (skin integrity, edema, etc.): Pt on trach collar, SpO2 88% or > during session    Exercises General Exercises - Lower Extremity Long Arc Quad: AROM;Strengthening;Both;10 reps;Seated Hip ABduction/ADduction: AROM;Strengthening;Both;10 reps;Seated (hip adduction pillow squeezes) Hip Flexion/Marching: AROM;Strengthening;Both;Seated;Standing (10 reps sitting, x 25 reps standing with BUE support on RW)   Assessment/Plan    PT Assessment Patient needs continued PT services  PT Problem List Decreased strength;Decreased balance;Decreased knowledge of precautions;Obesity;Cardiopulmonary status limiting activity;Decreased knowledge of use of DME;Decreased mobility;Decreased activity tolerance;Decreased coordination;Decreased range of motion;Decreased safety awareness       PT Treatment Interventions DME instruction;Functional mobility training;Balance training;Patient/family education;Modalities;Gait training;Therapeutic activities;Neuromuscular re-education;Radiographer, therapeutic;Therapeutic exercise;Cognitive remediation;Manual techniques    PT Goals (Current goals can be found in the Care Plan section)  Acute Rehab PT Goals Patient Stated Goal: I want to go home PT Goal Formulation: With patient Time For Goal Achievement: 05/11/21 Potential to Achieve Goals: Fair    Frequency Min 2X/week   Barriers to discharge Decreased caregiver support;Inaccessible home environment      Co-evaluation               AM-PAC PT "6 Clicks" Mobility  Outcome Measure Help needed turning from your back to your side while in a flat bed without using bedrails?: A Little Help needed moving from  lying on your back to sitting on the side of a flat bed without using bedrails?: A Little Help needed moving to and from a bed to a chair (including a wheelchair)?: A Lot Help needed standing up from a chair using your arms (e.g., wheelchair or bedside chair)?: A Little Help needed to walk in hospital room?: A Lot Help needed climbing 3-5 steps with a railing? : Total 6 Click Score: 14    End of Session Equipment Utilized During Treatment:  (O2 via trach collar) Activity Tolerance: Patient tolerated treatment well Patient left: in chair;with call bell/phone within reach;with family/visitor present Nurse Communication: Mobility status PT Visit Diagnosis: Muscle weakness (generalized) (M62.81);Difficulty in walking, not elsewhere classified (R26.2);Unsteadiness on feet (R26.81)    Time: [id number] PT Time Calculation (min) (ACUTE ONLY): 29 min   Charges:   PT Evaluation $PT Eval Moderate Complexity: 1 Mod PT Treatments $Therapeutic Exercise: 8-22 mins        Lavone Nian, PT, DPT 04/27/21, 4:29 PM   Waunita Schooner 04/27/2021, 4:28 PM

## 2021-04-27 NOTE — Progress Notes (Signed)
Patient taken off ventilator and placed back on 40% trach collar.  Trach done. Patient suctioned. HR: 123, SPO2: 97%

## 2021-04-27 NOTE — Progress Notes (Signed)
NAME:  Eddie Chapman, MRN:  [id number], DOB:  November 04, 1960, LOS: 2 ADMISSION DATE:  04/25/2021, CONSULTATION DATE:04/25/2021 REFERRING MD: Dr. Quentin Cornwall, CHIEF COMPLAINT:  Shortness of Breath  History of Present Illness:  This is a 60 yo male with complex comorbid history including systolic CHF chronically, pulmonary hypertension, obstructive sleep apnea, obesity hypoventilation syndrome, chronic tracheostomy, and COPD.  He presented to [facility] ER on 07/27 via EMS from home with c/o shortness of breath.  He was discharged from [facility] to [place] on 07/7 following prolonged hospitalization due to acute on chronic hypoxic hypercapnic respiratory failure secondary to acinotobacter species pneumonia and AECOPD requiring tracheostomy.  He was discharged from [facility] to home on 07/26.  Per ED notes pts family reports they DO NOT know how to take care of his tracheostomy at home.  EMS reported upon their arrival at pts home pt diaphoretic with O2 sats 85% on RA and diaphoretic, therefore he was placed on 10L of O2 and received 125 mg solumedrol.  He remained alert and oriented.   ED Course Upon arrival to the ER pt noted to have increased work of breathing despite 10L O2 via trach collar.  VBG revealed pH 7.27/CO2 97/acid-base excess 12.5/bicarb 44.5.  He was therefore placed on mechanical ventilation via chronic tracheostomy.  CXR revealed chronic lung changes.  He received azithromycin, cefepime, vancomycin, and duoneb x1.  PCCM team contacted for ICU admission.    Pertinent  Medical History  COPD OSA Morbid Obesity Hypertension Hyperlipidemia GERD Depression T8 Vertebral Fracture Subarachnoid Hemorrhage  Chronic Tracheostomy  Significant Hospital Events: Including procedures, antibiotic start and stop dates in addition to other pertinent events   Pt admitted to ICU on mechanical ventilation via chronic tracheostomy   Interim History / Subjective:  -Pt admitted with severe respiratory distress  requiring mechanical ventilation via chronic tracheostomy -Pt now on [place] Speaking Valve and eating tolerating well  -On 40% trach collar no s/sx of respiratory distress   Objective   Blood pressure 126/76, pulse 99, temperature 98.9 F (37.2 C), resp. rate 17, height _0  (1.778 m), weight (!) 145.8 kg, SpO2 99 %.    Vent Mode: PSV;CPAP FiO2 (%):  [40 %-60 %] 40 % PEEP:  [5 cmH20] 5 cmH20 Pressure Support:  [5 cmH20] 5 cmH20   Intake/Output Summary (Last 24 hours) at 04/27/2021 1031 Last data filed at 04/27/2021 0349 Gross per 24 hour  Intake 107.29 ml  Output 1775 ml  Net -1667.71 ml   Filed Weights   04/25/21 0842 04/26/21 0500  Weight: 136.1 kg (!) 145.8 kg   Examination: General: chronically ill appearing male, NAD on trach collar with [place] Speaking Valve in place HENT: supple, chronic tracheostomy midline dressing dry/intact Lungs: diminished throughout, even, non labored  Cardiovascular: sinus tach, s1s2, no R/G Abdomen: +BS x4, soft, obese, non tender, non distended  Extremities: trace bilateral lower extremity edema, normal tone  Neuro: awake and alert, follows commands, PERRLA GU: orders placed for external catheter placement   Resolved Hospital Problem list     Assessment & Plan:  Acute on chronic hypercapnic respiratory failure secondary to AECOPD amd Obesity Hypoventilation Syndrome Mechanical Ventilation via Chronic Tracheostomy  Hx: Pulmonary HTN, Morbid Obesity  -Continue trach collar during the day as tolerated and mechanical ventilation at night  -Maintain O2 sats 88 to 92% -Follow intermittent Chest X-ray & ABG as needed -Implement VAP Bundle -Scheduled and prn bronchodilator therapy; nebulized steroids  -Trend WBC and monitor fever curve -No  indication for abx therapy: Blood Cultures 07/27 negative>>  HTN  Hyperlipidemia  -Continuous telemetry monitoring  -Continue scheduled po metoprolol  Hyperglycemia  -CBG's  ac/hs -SSI  Chronic pain  -Resume outpatient oxycodone and discontinue morphine   Transition of care team working on discharge planning   Best Practice (right click and "Reselect all SmartList Selections" daily)   Diet/type: DYS 3 with thin liquids   DVT prophylaxis: LMWH GI prophylaxis: H2B Lines: N/A Foley:  N/A Code Status:  DNR Last date of multidisciplinary goals of care discussion [04/27/2021]  Labs   CBC: Recent Labs  Lab 04/25/21 0841 04/26/21 0617  WBC 13.4* 10.8*  HGB 14.2 12.2*  HCT 43.8 36.5*  MCV 100.2* 98.9  PLT 211 277    Basic Metabolic Panel: Recent Labs  Lab 04/25/21 0841 04/26/21 0617  NA 141 141  K 4.5 4.1  CL 95* 94*  CO2 35* 34*  GLUCOSE 172* 127*  BUN 14 20  CREATININE 0.78 0.85  CALCIUM 9.2 9.3  MG 2.1 1.8  PHOS 4.5 4.0   GFR: Estimated Creatinine Clearance: 131.8 mL/min (by C-G formula based on SCr of 0.85 mg/dL). Recent Labs  Lab 04/25/21 0841 04/25/21 0845 04/26/21 0617  PROCALCITON <0.10  --   --   WBC 13.4*  --  10.8*  LATICACIDVEN  --  1.8  --     Liver Function Tests: Recent Labs  Lab 04/25/21 0841  AST 35  ALT 26  ALKPHOS 63  BILITOT 0.9  PROT 7.4  ALBUMIN 3.4*   No results for input(s): LIPASE, AMYLASE in the last 168 hours. No results for input(s): AMMONIA in the last 168 hours.  ABG    Component Value Date/Time   PHART 7.55 (H) 04/25/2021 1652   PCO2ART 44 04/25/2021 1652   PO2ART 83 04/25/2021 1652   HCO3 38.5 (H) 04/25/2021 1652   O2SAT 97.4 04/25/2021 1652     Coagulation Profile: No results for input(s): INR, PROTIME in the last 168 hours.  Cardiac Enzymes: No results for input(s): CKTOTAL, CKMB, CKMBINDEX, TROPONINI in the last 168 hours.  HbA1C: Hgb A1c MFr Bld  Date/Time Value Ref Range Status  03/02/2021 04:52 AM 5.9 (H) 4.8 - 5.6 % Final    Comment:    (NOTE)         Prediabetes: 5.7 - 6.4         Diabetes: >6.4         Glycemic control for adults with diabetes: <7.0      CBG: Recent Labs  Lab 04/26/21 1557 04/26/21 1940 04/26/21 2300 04/27/21 0314 04/27/21 0658  GLUCAP 155* 101* 112* 89 97    Review of Systems: Positives in BOLD   Gen: Denies fever, chills, weight change, fatigue, night sweats HEENT: Denies blurred vision, double vision, hearing loss, tinnitus, sinus congestion, rhinorrhea, sore throat, neck stiffness, dysphagia PULM: shortness of breath, cough, sputum production, hemoptysis, wheezing CV: Denies chest pain, edema, orthopnea, paroxysmal nocturnal dyspnea, palpitations GI: Denies abdominal pain, nausea, vomiting, diarrhea, hematochezia, melena, constipation, change in bowel habits GU: Denies dysuria, hematuria, polyuria, oliguria, urethral discharge Endocrine: Denies hot or cold intolerance, polyuria, polyphagia or appetite change Derm: Denies rash, dry skin, scaling or peeling skin change Heme: Denies easy bruising, bleeding, bleeding gums Neuro: Denies headache, numbness, weakness, slurred speech, loss of memory or consciousness  Past Medical History:  He,  has a past medical history of Adenomatous colon polyp, Depression, Ear drum perforation, GERD (gastroesophageal reflux disease), Hyperlipidemia, Hypertension, and T8  vertebral fracture ([place]).   Surgical History:   Past Surgical History:  Procedure Laterality Date   CARPAL TUNNEL RELEASE     left hand   COLONOSCOPY  09/18/2009, 09/15/2014   COLONOSCOPY WITH PROPOFOL N/A 12/29/2017   Procedure: COLONOSCOPY WITH PROPOFOL;  Surgeon: Manya Silvas, MD;  Location: [facility] ENDOSCOPY;  Service: Endoscopy;  Laterality: N/A;   EYE SURGERY     FRACTURE SURGERY     HERNIA REPAIR     LIPOMA EXCISION     SPINE SURGERY     TRACHEOSTOMY TUBE PLACEMENT N/A 03/07/2021   Procedure: TRACHEOSTOMY;  Surgeon: Margaretha Sheffield, MD;  Location: [facility] ORS;  Service: ENT;  Laterality: N/A;     Social History:   reports that he has never smoked. He has never used smokeless tobacco. He reports that  he does not drink alcohol and does not use drugs.   Family History:  His family history includes Cancer in his mother; Varicose Veins in his mother.   Allergies Allergies  Allergen Reactions   Amlodipine Swelling    Ankle swelling    Divalproex Sodium Other (See Comments)    Elevated liver enzymes     Home Medications  Prior to Admission medications   Medication Sig Start Date End Date Taking? Authorizing Provider  ARIPiprazole (ABILIFY) 5 MG tablet Take 1 tablet (5 mg total) by mouth daily. 04/06/21  Yes Darel Hong D, NP  ascorbic acid (VITAMIN C) 250 MG tablet Take 1 tablet (250 mg total) by mouth 2 (two) times daily. 04/05/21   Bradly Bienenstock, NP  bisacodyl (DULCOLAX) 10 MG suppository Place 1 suppository (10 mg total) rectally daily as needed for moderate constipation. 04/05/21   Bradly Bienenstock, NP  Chlorhexidine Gluconate Cloth 2 % PADS Apply 6 each topically daily. 04/06/21   Bradly Bienenstock, NP  chlorhexidine gluconate, MEDLINE KIT, (PERIDEX) 0.12 % solution 15 mLs by Mouth Rinse route 2 (two) times daily. 04/05/21   Bradly Bienenstock, NP  docusate (COLACE) 50 MG/5ML liquid Place 10 mLs (100 mg total) into feeding tube 2 (two) times daily. 04/05/21   Bradly Bienenstock, NP  docusate sodium (COLACE) 100 MG capsule Take 1 capsule (100 mg total) by mouth 2 (two) times daily as needed for mild constipation. 04/05/21   Bradly Bienenstock, NP  enoxaparin (LOVENOX) 80 MG/0.8ML injection Inject 0.75 mLs (75 mg total) into the skin daily. 04/05/21   Bradly Bienenstock, NP  feeding supplement (ENSURE ENLIVE / ENSURE PLUS) LIQD Take 237 mLs by mouth 3 (three) times daily between meals. 04/05/21   Bradly Bienenstock, NP  insulin aspart (NOVOLOG) 100 UNIT/ML injection Inject 0-15 Units into the skin 4 (four) times daily -  before meals and at bedtime. 04/05/21   Darel Hong D, NP  ipratropium-albuterol (DUONEB) 0.5-2.5 (3) MG/3ML SOLN Take 3 mLs by nebulization 3 (three) times daily. 04/05/21   Bradly Bienenstock, NP  metoprolol tartrate (LOPRESSOR) 5 MG/5ML SOLN injection Inject 5 mLs (5 mg total) into the vein every 6 (six) hours. 04/05/21   Bradly Bienenstock, NP  oxyCODONE 10 MG TABS Place 1 tablet (10 mg total) into feeding tube every 6 (six) hours as needed for moderate pain. 04/05/21   Bradly Bienenstock, NP  polyethylene glycol (MIRALAX / GLYCOLAX) 17 g packet Take 17 g by mouth daily. 04/06/21   Bradly Bienenstock, NP  polyvinyl alcohol (LIQUIFILM TEARS) 1.4 % ophthalmic solution Place 1 drop into both eyes as  needed for dry eyes. 04/05/21   Bradly Bienenstock, NP  senna-docusate (SENOKOT-S) 8.6-50 MG tablet Take 1 tablet by mouth 2 (two) times daily. 04/05/21   Darel Hong D, NP  sodium chloride 0.9 % infusion Inject 250 mLs into the vein continuous. 04/05/21   Bradly Bienenstock, NP     Critical care time: 6 minutes       Rosilyn Mings, AGNP  Pulmonary/Critical Care Pager [phone] (please enter 7 digits) PCCM Consult Pager [phone] (please enter 7 digits)

## 2021-04-27 NOTE — Care Management (Signed)
60 yo male with hx of COPD, OHS, Pulmonary HTN, Chronic Tracheostomy, and Morbid Obesity admitted with acute on chronic hypoxic hypercapnic respiratory failure requiring mechanical ventilation via chronic tracheostomy.  Now tolerating trach collar at 40% needs nocturnal mechanical ventilation at ALL times.  Transition of Care consulted to assist with discharge planning   TRH will round from 04/27/2021

## 2021-04-27 NOTE — Progress Notes (Signed)
PHARMACIST - PHYSICIAN COMMUNICATION  CONCERNING: IV to Oral Route Change Policy  RECOMMENDATION: This patient is receiving Pepcid by the intravenous route.  Based on criteria approved by the Pharmacy and Therapeutics Committee, the intravenous medication(s) is/are being converted to the equivalent oral dose form(s).   DESCRIPTION: These criteria include: The patient is eating (either orally or via tube) and/or has been taking other orally administered medications for a least 24 hours The patient has no evidence of active gastrointestinal bleeding or impaired GI absorption (gastrectomy, short bowel, patient on TNA or NPO).  If you have questions about this conversion, please contact the [place], [facility] 04/27/2021 11:38 AM

## 2021-04-27 NOTE — TOC Initial Note (Signed)
Transition of Care [facility]) - Initial/Assessment Note    Patient Details  Name: Eddie Chapman MRN: [id number] Date of Birth: 1961/07/28  Transition of [facility]) CM/SW Contact:    Ova Freshwater Phone Number: [phone] 04/27/2021, 3:03 PM  Clinical Narrative:                  Patient presents to [facility] due to home for shob, resp distress.  Patient recently discharged from [facility] and was sent home under the care of his SO Baum,[name] (Significant other) [phone] [facility]).  Patient stated Ms. Baum sis not receive thorough training on how to care for his trach and DME. Patient stated he would like to return home with appropriate DME of this needs and home health PT. Patient stated he is active with [facility].  CSW stated I would contact [place] and discuss the recommendation for overnight vent. Patient verbalized understanding.  CSW contacted Thedore Mins from [employer].  Thedore Mins stated they scheduled the home assessment for Monday 04/30/2021 @ 10:30AM. They will conduct family education and lastly they will conduct an overnight shift in the ICU.  Expected Discharge Plan: [place] Barriers to Discharge: Continued Medical Work up   Patient Goals and CMS Choice Patient states their goals for this hospitalization and ongoing recovery are:: to return home      Expected Discharge Plan and Services Expected Discharge Plan: [place] In-house Referral: Clinical Social Work   Post Acute Care Choice: Durable Medical Equipment, Home Health Living arrangements for the past 2 months: [place]                   DME Agency: [employer] Date DME Agency Contacted: 04/27/21 Time DME Agency Contacted: [id number] Representative spoke with at DME Agency: Andree Coss [phone]            Prior Living Arrangements/Services Living arrangements for the past 2 months: [place] Lives with:: Significant Other (Baum,[name]  (Significant other)   [phone] (Mobile)) Patient language and need for interpreter reviewed:: Yes Do you feel safe going back to the place where you live?: Yes      Need for Family Participation in Patient Care: Yes (Comment) Care giver support system in place?: Yes (comment) Current home services: DME Criminal Activity/Legal Involvement Pertinent to Current Situation/Hospitalization: No - Comment as needed  Activities of Daily Living Home Assistive Devices/Equipment: Walker (specify type) ADL Screening (condition at time of admission) Patient's cognitive ability adequate to safely complete daily activities?: Yes Is the patient deaf or have difficulty hearing?: No Does the patient have difficulty seeing, even when wearing glasses/contacts?: No Does the patient have difficulty concentrating, remembering, or making decisions?: No Patient able to express need for assistance with ADLs?: Yes Does the patient have difficulty dressing or bathing?: Yes Independently performs ADLs?: No Dressing (OT): Needs assistance Is this a change from baseline?: Pre-admission baseline Grooming: Needs assistance Is this a change from baseline?: Pre-admission baseline Bathing: Needs assistance Is this a change from baseline?: Pre-admission baseline Toileting: Needs assistance Is this a change from baseline?: Pre-admission baseline In/Out Bed: Needs assistance Is this a change from baseline?: Pre-admission baseline Walks in Home: Needs assistance Is this a change from baseline?: Pre-admission baseline Does the patient have difficulty walking or climbing stairs?: Yes Weakness of Legs: Both Weakness of Arms/Hands: None  Permission Sought/Granted Permission sought to share information with : Family Supports Permission granted to share information with :  Yes, Verbal Permission Granted  Share Information with NAME: Baum,[name] (Significant other)   [phone] (Mobile)           Emotional  Assessment Appearance:: Appears older than stated age Attitude/Demeanor/Rapport: Engaged Affect (typically observed): Stable Orientation: : Oriented to Self, Oriented to Place, Oriented to  Time, Oriented to Situation Alcohol / Substance Use: Not Applicable Psych Involvement: No (comment)  Admission diagnosis:  Acute on chronic respiratory failure with hypercapnia (HCC) [J96.22] Acute respiratory failure with hypoxia and hypercapnia (HCC) [J96.01, J96.02] Patient Active Problem List   Diagnosis Date Noted   Acute on chronic respiratory failure with hypercapnia (HCC) 04/25/2021   Depression    Atelectasis    Leukocytosis    Fever    Acute on chronic systolic congestive heart failure (HCC)    Acute respiratory failure with hypoxia (HCC)    Palliative care by specialist    Respiratory failure with hypercapnia ([place]) 02/23/2021   Acute respiratory failure with hypoxia and hypercapnia (HCC) 10/07/2020   OSA (obstructive sleep apnea) 10/07/2020   Obesity hypoventilation syndrome ([place]) [id number]   Acute diastolic CHF (congestive heart failure) ([place]) 10/07/2020   Obesity, Class III, BMI 40-49.9 (morbid obesity) ([place]) 10/07/2020   Occlusion and stenosis of vertebral artery 08/19/2016   Aneurysm, cerebral, nonruptured 08/19/2016   Essential hypertension 08/19/2016   Hyperlipidemia 08/19/2016   PCP:  Adin Hector, MD Pharmacy:   [place], [place] - [facility] [address] [place] [address] Phone: [phone] Fax: [phone]     Social Determinants of Health (SDOH) Interventions    Readmission Risk Interventions No flowsheet data found.

## 2021-04-28 DIAGNOSIS — R739 Hyperglycemia, unspecified: Secondary | ICD-10-CM

## 2021-04-28 LAB — CBC WITH DIFFERENTIAL/PLATELET
Abs Immature Granulocytes: 0.07 10*3/uL (ref 0.00–0.07)
Basophils Absolute: 0 10*3/uL (ref 0.0–0.1)
Basophils Relative: 0 %
Eosinophils Absolute: 0.1 10*3/uL (ref 0.0–0.5)
Eosinophils Relative: 1 %
HCT: 39.7 % (ref 39.0–52.0)
Hemoglobin: 12.6 g/dL — ABNORMAL LOW (ref 13.0–17.0)
Immature Granulocytes: 1 %
Lymphocytes Relative: 14 %
Lymphs Abs: 1.1 10*3/uL (ref 0.7–4.0)
MCH: 32.3 pg (ref 26.0–34.0)
MCHC: 31.7 g/dL (ref 30.0–36.0)
MCV: 101.8 fL — ABNORMAL HIGH (ref 80.0–100.0)
Monocytes Absolute: 1 10*3/uL (ref 0.1–1.0)
Monocytes Relative: 13 %
Neutro Abs: 5.6 10*3/uL (ref 1.7–7.7)
Neutrophils Relative %: 71 %
Platelets: 183 10*3/uL (ref 150–400)
RBC: 3.9 MIL/uL — ABNORMAL LOW (ref 4.22–5.81)
RDW: 13.7 % (ref 11.5–15.5)
WBC: 7.9 10*3/uL (ref 4.0–10.5)
nRBC: 0 % (ref 0.0–0.2)

## 2021-04-28 LAB — GLUCOSE, CAPILLARY
Glucose-Capillary: 122 mg/dL — ABNORMAL HIGH (ref 70–99)
Glucose-Capillary: 121 mg/dL — ABNORMAL HIGH (ref 70–99)
Glucose-Capillary: 123 mg/dL — ABNORMAL HIGH (ref 70–99)
Glucose-Capillary: 116 mg/dL — ABNORMAL HIGH (ref 70–99)

## 2021-04-28 LAB — MAGNESIUM: Magnesium: 2.1 mg/dL (ref 1.7–2.4)

## 2021-04-28 LAB — BASIC METABOLIC PANEL
Anion gap: 9 (ref 5–15)
BUN: 16 mg/dL (ref 8–23)
CO2: 35 mmol/L — ABNORMAL HIGH (ref 22–32)
Calcium: 8.9 mg/dL (ref 8.9–10.3)
Chloride: 94 mmol/L — ABNORMAL LOW (ref 98–111)
Creatinine, Ser: 0.8 mg/dL (ref 0.61–1.24)
GFR, Estimated: >60 mL/min (ref >60)
Glucose, Bld: 123 mg/dL — ABNORMAL HIGH (ref 70–99)
Potassium: 4.2 mmol/L (ref 3.5–5.1)
Sodium: 138 mmol/L (ref 135–145)

## 2021-04-28 LAB — PHOSPHORUS: Phosphorus: 4.1 mg/dL (ref 2.5–4.6)

## 2021-04-28 NOTE — Progress Notes (Signed)
Inpatient Rehab Admissions Coordinator Note:   Per therapy recommendations, pt was screened for CIR candidacy by Clemens Catholic, [place] CCC-SLP. Note that pt. Requires nocturnal vent support and CIR is unable to accept Pt.'s unless fully weaned from vent. I will  not pursue CIR consult at this time. Pt. Will likely need to return to [facility].   Clemens Catholic, [place], [place] Admissions Coordinator  [phone] ([place]) [phone] (office)

## 2021-04-28 NOTE — Evaluation (Signed)
Occupational Therapy Evaluation Patient Details Name: Eddie Chapman MRN: [id number] DOB: [birth year] Today's Date: 04/28/2021    History of Present Illness Pt is a 60 y/o M who presented to the ED on 04/25/21 with c/c of SOB. Pt was discharged from [facility] to [place] on 04/05/21 following prolonged hospitalization 2/2 acute on chronic hypoxic hypercapnic respiratory failure 2/2 acinotobacter species pneumonia & AECOPD requiring tracheostomy. Pt was d/c home from [facility] on 04/24/21. PMH: systolic CHF, pulmonary HTN, OSA, obesity hypoventilation syndrome, chronic trach, COPD, depression, HLD, T8 vertebral fx   Clinical Impression   Pt seen for OT evaluation this date in setting of acute hospitalization d/t respiratory failure. Pt presents this date with some general deconditioning and decreased fxl activity tolerance. Pt had d/c'ed home with hospital bed and otherwise already had most equipment from prior hospital and rehab stays. Pt reports that he had been doing some walking at rehab with 2p assist. Before last hospitalization, pt was amb short distance with rollator w/o external assistance. OT evaluation completed in tandem with PT treatment to maximize benefits as pt with low fxl activity tolerance. On OT assessment this date, pt requiring: SETUP to SUPV with cues to sequence for UB ADLs such as donning gown. Pt requires MAX A for posterior peri care in standing for LB bathign or toileting: CGA to sustain stand, but MAX A for actual thorough completion of peri care 2/2 body habitus and limited dynamic standing balance. Pt requires CGA +2 today for ADL transfers and fxl mobility for safety and equipment mgt as well as cues as pt is somewhat impulsive. At this time, anticipate pt will require inpatient rehabilitation f/u OT services to improve to reasonable functional level to live at home with his significant other safely.     Follow Up Recommendations  CIR    Equipment Recommendations  Other (comment)  (still could benefit from w/c, apparently order has been in place and the equipment is just on backorder.)    Recommendations for Other Services       Precautions / Restrictions Precautions Precautions: Fall Precaution Comments: trach collar, PMV Restrictions Weight Bearing Restrictions: No      Mobility Bed Mobility Overal bed mobility: Needs Assistance Bed Mobility: Supine to Sit     Supine to sit: Min guard;HOB elevated     General bed mobility comments: cues for hand placement    Transfers Overall transfer level: Needs assistance Equipment used: Rolling walker (2 wheeled) (bari) Transfers: Sit to/from [facility]   Stand pivot transfers: Min guard;+2 safety/equipment       General transfer comment: CGA +2 primarily to manage lines/leads. Pt with good lift off, cues for hand placement with RW    Balance Overall balance assessment: Needs assistance Sitting-balance support: Feet supported Sitting balance-Leahy Scale: Good Sitting balance - Comments: G static sitting unsupported EOB   Standing balance support: Bilateral upper extremity supported;During functional activity Standing balance-Leahy Scale: Fair Standing balance comment: UE support and CGA to sustain balance with fxl mobility                           ADL either performed or assessed with clinical judgement   ADL Overall ADL's : Needs assistance/impaired                                       General ADL Comments: SETUP  to SUPV with cues to sequence for UB ADLs such as donning gown. Pt requires MAX A for posterior peri care in standing for LB bathign or toileting: CGA to sustain stand, but MAX A for actual thorough completion of peri care 2/2 body habitus and limited dynamic standing balance     Vision Patient Visual Report: No change from baseline       Perception     Praxis      Pertinent Vitals/Pain Pain Assessment: No/denies pain     Hand  Dominance Right   Extremity/Trunk Assessment Upper Extremity Assessment Upper Extremity Assessment: Generalized weakness   Lower Extremity Assessment Lower Extremity Assessment: Generalized weakness       Communication Communication Communication: Tracheostomy   Cognition Arousal/Alertness: Awake/alert Behavior During Therapy: WFL for tasks assessed/performed;Impulsive Overall Cognitive Status: History of cognitive impairments - at baseline Area of Impairment: Safety/judgement;Attention;Problem solving;Following commands                   Current Attention Level: Focused Memory: Decreased short-term memory;Decreased recall of precautions Following Commands: Follows one step commands consistently Safety/Judgement: Decreased awareness of deficits;Decreased awareness of safety Awareness: Anticipatory;Emergent   General Comments: oriented, able to follow commands. but noted to be impulsive at times during session requriring cues to slow down with fxl mobility, could also be r/t [facility].   General Comments       Exercises Other Exercises Other Exercises: OT edu re: safety with ADLs/ADL mobility, awareness of obstacles/hazards. Pt with good initial reception, but limited carryover during actual functional task.   Shoulder Instructions      Home Living Family/patient expects to be discharged to:: Private residence Living Arrangements: Spouse/significant other Available Help at Discharge: Family;Available 24 hours/day Type of Home: House Home Access: Stairs to enter [employer] of Steps: 2 Entrance Stairs-Rails: None Home Layout: One level     Bathroom Shower/Tub: Teacher, early years/pre: Standard     Home Equipment: Environmental consultant - 2 wheels;Bedside commode;Other (comment);Grab bars - tub/shower;Grab bars - toilet;Hospital bed (bari [facility], w/c was ordered, but is on backorder)   Additional Comments: [name] (girlfriend) reports BSC is too narrow       Prior Functioning/Environment Level of Independence: Needs assistance  Gait / Transfers Assistance Needed: rollator for gait in home PRN, SPC for community use PRN, pt is able to ambulate around small store (ex: [place]) but not larger store (ex: [employer]) 2/2 chronic heart issues ADL's / Homemaking Assistance Needed: Per SO, pt requires occsional assistance for LB access during ADL management.   Comments: Pt recently d/c from [facility] to home. [name] (girlfriend) reports pt took non emergent EMS transport home & hadn't mobilized with her, but was ambulatory with +2 assist at [place].        OT Problem List: Decreased strength;Decreased coordination;Cardiopulmonary status limiting activity;Decreased range of motion;Decreased cognition;Decreased activity tolerance;Decreased safety awareness;Impaired balance (sitting and/or standing);Decreased knowledge of use of DME or AE;Impaired UE functional use;Obesity      OT Treatment/Interventions: Self-care/ADL training;Therapeutic exercise;Therapeutic activities;Energy conservation;Visual/perceptual remediation/compensation;DME and/or AE instruction;Balance training;Patient/family education;Manual therapy;Modalities;Cognitive remediation/compensation;Neuromuscular education    OT Goals(Current goals can be found in the care plan section) Acute Rehab OT Goals Patient Stated Goal: I want to go home OT Goal Formulation: With patient Time For Goal Achievement: 04/17/21 Potential to Achieve Goals: Good ADL Goals Pt Will Perform Lower Body Dressing: with supervision;sit to/from stand Pt Will Transfer to Toilet: with supervision;ambulating;bedside commode (with LRAD to commode at least ~15' away to  improve tolerance for fxl HH distances.) Pt Will Perform Toileting - Clothing Manipulation and hygiene: with supervision;sit to/from stand;with adaptive equipment  OT Frequency: Min 3X/week   Barriers to D/C: Inaccessible home environment;Decreased caregiver  support          Co-evaluation PT/OT/SLP Co-Evaluation/Treatment: Yes Reason for Co-Treatment: Complexity of the patient's impairments (multi-system involvement) PT goals addressed during session: Mobility/safety with mobility;Balance OT goals addressed during session: ADL's and self-care;Proper use of Adaptive equipment and DME      AM-PAC OT "6 Clicks" Daily Activity     Outcome Measure Help from another person eating meals?: None Help from another person taking care of personal grooming?: A Little Help from another person toileting, which includes using toliet, bedpan, or urinal?: A Lot Help from another person bathing (including washing, rinsing, drying)?: A Lot Help from another person to put on and taking off regular upper body clothing?: A Little Help from another person to put on and taking off regular lower body clothing?: A Lot 6 Click Score: 16   End of Session Equipment Utilized During Treatment: Gait belt;Rolling walker;Oxygen Nurse Communication: Mobility status  Activity Tolerance: Patient limited by fatigue Patient left: with call bell/phone within reach;with family/visitor present;in chair;with chair alarm set  OT Visit Diagnosis: Other abnormalities of gait and mobility (R26.89);Muscle weakness (generalized) (M62.81);Other symptoms and signs involving cognitive function                Time: [id number] OT Time Calculation (min): 36 min Charges:  OT General Charges $OT Visit: 1 Visit OT Evaluation $OT Eval Moderate Complexity: 1 Mod OT Treatments $Self Care/Home Management : 8-22 mins  Gerrianne Scale, MS, OTR/L ascom [phone] 04/28/21, 1:57 PM

## 2021-04-28 NOTE — Progress Notes (Signed)
Physical Therapy Treatment Patient Details Name: Eddie Chapman MRN: [id number] DOB: Nov 22, 1960 Today's Date: 04/28/2021    History of Present Illness Pt is a 60 y/o M who presented to the ED on 04/25/21 with c/c of SOB. Pt was discharged from [facility] to [place] on 04/05/21 following prolonged hospitalization 2/2 acute on chronic hypoxic hypercapnic respiratory failure 2/2 acinotobacter species pneumonia & AECOPD requiring tracheostomy. Pt was d/c home from [place] on 04/24/21. PMH: systolic CHF, pulmonary HTN, OSA, obesity hypoventilation syndrome, chronic trach, COPD, depression, HLD, T8 vertebral fx    PT Comments    The pt progresses ambulation distances this session. He presents with cognitive impairment, impulsivity, and H.O.H. which greatly limits safety to mobility. PMV was donned throughout entirety of session without desaturation. He continues to present with bed mobility and gait deficits. The pt continues to be a great candidate for CIR and can tolerate 3 hours daily. PT will continue to follow.     Follow Up Recommendations  CIR     Equipment Recommendations       Recommendations for Other Services       Precautions / Restrictions Precautions Precautions: Fall Precaution Comments: trach collar, PMV Restrictions Weight Bearing Restrictions: No    Mobility  Bed Mobility Overal bed mobility: Needs Assistance Bed Mobility: Supine to Sit     Supine to sit: Min guard;HOB elevated     General bed mobility comments: cues for hand placement    Transfers Overall transfer level: Needs assistance Equipment used: Rolling walker (2 wheeled) (bari) Transfers: Sit to/from Stand Sit to Stand: Min guard Stand pivot transfers: Min guard;+2 safety/equipment       General transfer comment: CGA +2 primarily to manage lines/leads. Pt with good lift off, cues for hand placement with RW  Ambulation/Gait Ambulation/Gait assistance: Min assist Gait Distance (Feet): 50 Feet Assistive  device: Rolling walker (2 wheeled)   Gait velocity: gait velocity varied; pt unable to maintain control with elevated speeds.   General Gait Details: 50'x2; pt presenting with need for multiple verbal cues for safety and approximation to AD.   Stairs             Wheelchair Mobility    Modified Rankin (Stroke Patients Only)       Balance Overall balance assessment: Needs assistance Sitting-balance support: Feet supported Sitting balance-Leahy Scale: Good Sitting balance - Comments: G static sitting unsupported EOB   Standing balance support: Bilateral upper extremity supported;During functional activity Standing balance-Leahy Scale: Fair Standing balance comment: UE support and CGA to sustain balance with fxl mobility                            Cognition Arousal/Alertness: Awake/alert Behavior During Therapy: WFL for tasks assessed/performed;Impulsive Overall Cognitive Status: History of cognitive impairments - at baseline Area of Impairment: Safety/judgement;Attention;Problem solving;Following commands                 Orientation Level: Disoriented to;Situation Current Attention Level: Focused Memory: Decreased short-term memory;Decreased recall of precautions Following Commands: Follows one step commands consistently Safety/Judgement: Decreased awareness of deficits;Decreased awareness of safety Awareness: Anticipatory;Emergent   General Comments: oriented, able to follow commands. but noted to be impulsive at times during session requriring cues to slow down with fxl mobility, could also be r/t [facility]      Exercises Other Exercises Other Exercises: OT edu re: safety with ADLs/ADL mobility, awareness of obstacles/hazards. Pt with good initial reception, but limited carryover  during actual functional task.    General Comments        Pertinent Vitals/Pain Pain Assessment: No/denies pain    Home Living Family/patient expects to be discharged  to:: Private residence Living Arrangements: Spouse/significant other Available Help at Discharge: Family;Available 24 hours/day Type of Home: House Home Access: Stairs to enter Entrance Stairs-Rails: None Home Layout: One level Home Equipment: Environmental consultant - 2 wheels;Bedside commode;Other (comment);Grab bars - tub/shower;Grab bars - toilet;Hospital bed (bari [facility], w/c was ordered, but is on backorder) Additional Comments: [name] (girlfriend) reports BSC is too narrow    Prior Function Level of Independence: Needs assistance  Gait / Transfers Assistance Needed: rollator for gait in home PRN, SPC for community use PRN, pt is able to ambulate around small store (ex: [place]) but not larger store (ex: [employer]) 2/2 chronic heart issues ADL's / Homemaking Assistance Needed: Per SO, pt requires occsional assistance for LB access during ADL management. Comments: Pt recently d/c from [facility] to home. [name] (girlfriend) reports pt took non emergent EMS transport home & hadn't mobilized with her, but was ambulatory with +2 assist at [place].   PT Goals (current goals can now be found in the care plan section) Acute Rehab PT Goals Patient Stated Goal: I want to go home PT Goal Formulation: With patient Time For Goal Achievement: 05/11/21 Potential to Achieve Goals: Fair Progress towards PT goals: Progressing toward goals    Frequency    7X/week      PT Plan Current plan remains appropriate    Co-evaluation PT/OT/SLP Co-Evaluation/Treatment: Yes Reason for Co-Treatment: Complexity of the patient's impairments (multi-system involvement) PT goals addressed during session: Mobility/safety with mobility;Balance;Proper use of DME OT goals addressed during session: ADL's and self-care;Proper use of Adaptive equipment and DME      AM-PAC PT "6 Clicks" Mobility   Outcome Measure  Help needed turning from your back to your side while in a flat bed without using bedrails?: A Lot Help needed moving  from lying on your back to sitting on the side of a flat bed without using bedrails?: A Lot Help needed moving to and from a bed to a chair (including a wheelchair)?: A Little Help needed standing up from a chair using your arms (e.g., wheelchair or bedside chair)?: A Little Help needed to walk in hospital room?: A Little Help needed climbing 3-5 steps with a railing? : A Lot 6 Click Score: 15    End of Session   Activity Tolerance: Patient tolerated treatment well Patient left: in chair;with call bell/phone within reach;with family/visitor present Nurse Communication: Mobility status PT Visit Diagnosis: Muscle weakness (generalized) (M62.81);Difficulty in walking, not elsewhere classified (R26.2);Unsteadiness on feet (R26.81)     Time: [id number] PT Time Calculation (min) (ACUTE ONLY): 41 min  Charges:  $Gait Training: 23-37 mins                     2:08 PM, 04/28/21 Alexavier Tsutsui A. Saverio Danker PT, DPT Physical Therapist - [facility]    Cherlynn Popiel A Idamay Hosein 04/28/2021, 2:06 PM

## 2021-04-28 NOTE — Progress Notes (Signed)
PROGRESS NOTE    Eddie Chapman  [id number] DOB: 1961/01/08 DOA: 04/25/2021 PCP: Adin Hector, MD   Assessment & Plan:   Active Problems:   Acute on chronic respiratory failure with hypercapnia (HCC)   Acute on chronic hypercapnic respiratory failure: secondary to COPD, obesity hypoventilation syndrome. Has hx of chronic tracheostomy. Continue w/ trach collar during the day and mechanical ventilation at night. Continue on bronchodilators  HTN: continue on metoprolol  Hyperglycemia: no hx of DM but will check HbA1c. Continue on SSI w/ accuchecks  Chronic pain: continue on home dose of oxycodone  Morbid obesity: BMI 46.1. Complicates overall care and prognosis    DVT prophylaxis: (lovenox  Code Status: DNR Family Communication: discussed pt's care w/ pt's girlfriend at bedside and answered her questions Disposition Plan: depends on PT/OT recs.  Level of care: Stepdown  Status is: Inpatient  Remains inpatient appropriate because:Unsafe d/c plan and Inpatient level of care appropriate due to severity of illness  Dispo: The patient is from: Home              Anticipated d/c is to: SNF              Patient currently is not medically stable to d/c.   Difficult to place patient Yes     Consultants:  ICU  Procedures:   Antimicrobials:    Subjective: Pt c/o malaise  Objective: Vitals:   04/28/21 0446 04/28/21 0500 04/28/21 0600 04/28/21 0700  BP:  (!) 142/90 (!) 148/95 (!) 156/102  Pulse:  85 90 (!) 104  Resp:  (!) 29 (!) 21 18  Temp: 98.7 F (37.1 C)     TempSrc: Oral     SpO2:  100% 100% 100%  Weight:      Height:        Intake/Output Summary (Last 24 hours) at 04/28/2021 0823 Last data filed at 04/28/2021 0446 Gross per 24 hour  Intake --  Output 2050 ml  Net -2050 ml   Filed Weights   04/25/21 0842 04/26/21 0500  Weight: 136.1 kg (!) 145.8 kg    Examination:  General exam: Appears calm and comfortable. Morbidly obese Respiratory  system: diminished breath sounds b/l Cardiovascular system: S1 & S2 +. No rubs, gallops or clicks. B/l LE edema Gastrointestinal system: Abdomen is nondistended, soft and nontender.  Normal bowel sounds heard. Central nervous system: Alert and oriented. Moves all extremities  Psychiatry: Judgement and insight appear normal. Mood & affect appropriate.     Data Reviewed: I have personally reviewed following labs and imaging studies  CBC: Recent Labs  Lab 04/25/21 0841 04/26/21 0617 04/28/21 0535  WBC 13.4* 10.8* 7.9  NEUTROABS  --   --  5.6  HGB 14.2 12.2* 12.6*  HCT 43.8 36.5* 39.7  MCV 100.2* 98.9 101.8*  PLT 211 189 [id number]   Basic Metabolic Panel: Recent Labs  Lab 04/25/21 0841 04/26/21 0617 04/28/21 0535  NA 141 141 138  K 4.5 4.1 4.2  CL 95* 94* 94*  CO2 35* 34* 35*  GLUCOSE 172* 127* 123*  BUN '14 20 16  '$ CREATININE 0.78 0.85 0.80  CALCIUM 9.2 9.3 8.9  MG 2.1 1.8 2.1  PHOS 4.5 4.0 4.1   GFR: Estimated Creatinine Clearance: 140 mL/min (by C-G formula based on SCr of 0.8 mg/dL). Liver Function Tests: Recent Labs  Lab 04/25/21 0841  AST 35  ALT 26  ALKPHOS 63  BILITOT 0.9  PROT 7.4  ALBUMIN 3.4*   No  results for input(s): LIPASE, AMYLASE in the last 168 hours. No results for input(s): AMMONIA in the last 168 hours. Coagulation Profile: No results for input(s): INR, PROTIME in the last 168 hours. Cardiac Enzymes: No results for input(s): CKTOTAL, CKMB, CKMBINDEX, TROPONINI in the last 168 hours. BNP (last 3 results) No results for input(s): PROBNP in the last 8760 hours. HbA1C: No results for input(s): HGBA1C in the last 72 hours. CBG: Recent Labs  Lab 04/27/21 0658 04/27/21 1119 04/27/21 1530 04/27/21 2106 04/28/21 0750  GLUCAP 97 153* 150* 117* 122*   Lipid Profile: No results for input(s): CHOL, HDL, LDLCALC, TRIG, CHOLHDL, LDLDIRECT in the last 72 hours. Thyroid Function Tests: No results for input(s): TSH, T4TOTAL, FREET4, T3FREE, THYROIDAB  in the last 72 hours. Anemia Panel: No results for input(s): VITAMINB12, FOLATE, FERRITIN, TIBC, IRON, RETICCTPCT in the last 72 hours. Sepsis Labs: Recent Labs  Lab 04/25/21 0841 04/25/21 0845  PROCALCITON <0.10  --   LATICACIDVEN  --  1.8    Recent Results (from the past 240 hour(s))  Blood culture (routine x 2)     Status: None (Preliminary result)   Collection Time: 04/25/21  8:44 AM   Specimen: BLOOD  Result Value Ref Range Status   Specimen Description BLOOD RIGHT ANTECUBITAL  Final   Special Requests   Final    BOTTLES DRAWN AEROBIC AND ANAEROBIC Blood Culture results may not be optimal due to an inadequate volume of blood received in culture bottles   Culture   Final    NO GROWTH 2 DAYS Performed at [facility], [address], [place], [place] [address]    Report Status PENDING  Incomplete  Resp Panel by RT-PCR (Flu A&B, Covid) Nasopharyngeal Swab     Status: None   Collection Time: 04/25/21  8:48 AM   Specimen: Nasopharyngeal Swab; Nasopharyngeal(NP) swabs in vial transport medium  Result Value Ref Range Status   SARS Coronavirus 2 by RT PCR NEGATIVE NEGATIVE Final    Comment: (NOTE) SARS-CoV-2 target nucleic acids are NOT DETECTED.  The SARS-CoV-2 RNA is generally detectable in upper respiratory specimens during the acute phase of infection. The lowest concentration of SARS-CoV-2 viral copies this assay can detect is 138 copies/mL. A negative result does not preclude SARS-Cov-2 infection and should not be used as the sole basis for treatment or other patient management decisions. A negative result may occur with  improper specimen collection/handling, submission of specimen other than nasopharyngeal swab, presence of viral mutation(s) within the areas targeted by this assay, and inadequate number of viral copies(<138 copies/mL). A negative result must be combined with clinical observations, patient history, and epidemiological information. The  expected result is Negative.  Fact Sheet for Patients:  EntrepreneurPulse.com.au  Fact Sheet for Healthcare Providers:  IncredibleEmployment.be  This test is no t yet approved or cleared by the [place] FDA and  has been authorized for detection and/or diagnosis of SARS-CoV-2 by FDA under an Emergency Use Authorization (EUA). This EUA will remain  in effect (meaning this test can be used) for the duration of the COVID-19 declaration under Section 564(b)(1) of the Act, 21 U.S.C.section 360bbb-3(b)(1), unless the authorization is terminated  or revoked sooner.       Influenza A by PCR NEGATIVE NEGATIVE Final   Influenza B by PCR NEGATIVE NEGATIVE Final    Comment: (NOTE) The Xpert Xpress SARS-CoV-2/FLU/RSV plus assay is intended as an aid in the diagnosis of influenza from Nasopharyngeal swab specimens and should not be  used as a sole basis for treatment. Nasal washings and aspirates are unacceptable for Xpert Xpress SARS-CoV-2/FLU/RSV testing.  Fact Sheet for Patients: EntrepreneurPulse.com.au  Fact Sheet for Healthcare Providers: IncredibleEmployment.be  This test is not yet approved or cleared by the [place] FDA and has been authorized for detection and/or diagnosis of SARS-CoV-2 by FDA under an Emergency Use Authorization (EUA). This EUA will remain in effect (meaning this test can be used) for the duration of the COVID-19 declaration under Section 564(b)(1) of the Act, 21 U.S.C. section 360bbb-3(b)(1), unless the authorization is terminated or revoked.  Performed at [facility], [address] [place], [place] [address]   Blood culture (routine x 2)     Status: None (Preliminary result)   Collection Time: 04/25/21  8:49 AM   Specimen: BLOOD  Result Value Ref Range Status   Specimen Description BLOOD BLOOD RIGHT FOREARM  Final   Special Requests   Final    BOTTLES DRAWN  AEROBIC AND ANAEROBIC Blood Culture results may not be optimal due to an inadequate volume of blood received in culture bottles   Culture   Final    NO GROWTH 2 DAYS Performed at [facility], [address] [place], [place] [address]    Report Status PENDING  Incomplete  MRSA Next Gen by PCR, Nasal     Status: Abnormal   Collection Time: 04/25/21  3:41 PM   Specimen: Nasal Mucosa; Nasal Swab  Result Value Ref Range Status   MRSA by PCR Next Gen DETECTED (A) NOT DETECTED Final    Comment: RESULT CALLED TO, READ BACK BY AND VERIFIED WITH: MATHEW WRIGHT '@1824'$  04/25/21 MJU (NOTE) The GeneXpert MRSA Assay (FDA approved for NASAL specimens only), is one component of a comprehensive MRSA colonization surveillance program. It is not intended to diagnose MRSA infection nor to guide or monitor treatment for MRSA infections. Test performance is not FDA approved in patients less than 45 years old. Performed at [facility], [address], [place],  [address]          Radiology Studies: No results found.      Scheduled Meds:  ARIPiprazole  5 mg Oral Daily   budesonide (PULMICORT) nebulizer solution  0.5 mg Nebulization BID   Chlorhexidine Gluconate Cloth  6 each Topical Q0600   enoxaparin (LOVENOX) injection  0.5 mg/kg Subcutaneous Q24H   famotidine  20 mg Oral BID   insulin aspart  0-15 Units Subcutaneous TID WC   insulin aspart  0-5 Units Subcutaneous QHS   ipratropium-albuterol  3 mL Nebulization TID   metoprolol tartrate  25 mg Oral BID   mupirocin ointment  1 application Nasal BID   Continuous Infusions:   LOS: 3 days    Time spent: 33 mins     Wyvonnia Dusky, MD [facility] Pager [phone]  If 7PM-7AM, please contact night-coverage 04/28/2021, 8:23 AM

## 2021-04-28 NOTE — Progress Notes (Signed)
Patient noted to have increased respirations as vent is alarming continuously. Patient with rhoncus BBS. Pressure support increased to 10. Trach suctioned for moderate amount of thick secretions. Patient tolerated interventions well. NP aware of vent change

## 2021-04-28 NOTE — Progress Notes (Signed)
Patient extubated to 3 liters. Wife at bedside continue to assess.

## 2021-04-29 LAB — BASIC METABOLIC PANEL
Anion gap: 4 — ABNORMAL LOW (ref 5–15)
BUN: 16 mg/dL (ref 8–23)
CO2: 37 mmol/L — ABNORMAL HIGH (ref 22–32)
Calcium: 8.7 mg/dL — ABNORMAL LOW (ref 8.9–10.3)
Chloride: 97 mmol/L — ABNORMAL LOW (ref 98–111)
Creatinine, Ser: 0.7 mg/dL (ref 0.61–1.24)
GFR, Estimated: >60 mL/min (ref >60)
Glucose, Bld: 109 mg/dL — ABNORMAL HIGH (ref 70–99)
Potassium: 4.2 mmol/L (ref 3.5–5.1)
Sodium: 138 mmol/L (ref 135–145)

## 2021-04-29 LAB — CBC
HCT: 37.8 % — ABNORMAL LOW (ref 39.0–52.0)
Hemoglobin: 12.1 g/dL — ABNORMAL LOW (ref 13.0–17.0)
MCH: 32.4 pg (ref 26.0–34.0)
MCHC: 32 g/dL (ref 30.0–36.0)
MCV: 101.3 fL — ABNORMAL HIGH (ref 80.0–100.0)
Platelets: 175 10*3/uL (ref 150–400)
RBC: 3.73 MIL/uL — ABNORMAL LOW (ref 4.22–5.81)
RDW: 13.7 % (ref 11.5–15.5)
WBC: 8.8 10*3/uL (ref 4.0–10.5)
nRBC: 0 % (ref 0.0–0.2)

## 2021-04-29 LAB — GLUCOSE, CAPILLARY
Glucose-Capillary: 123 mg/dL — ABNORMAL HIGH (ref 70–99)
Glucose-Capillary: 115 mg/dL — ABNORMAL HIGH (ref 70–99)
Glucose-Capillary: 147 mg/dL — ABNORMAL HIGH (ref 70–99)

## 2021-04-29 NOTE — Progress Notes (Signed)
PROGRESS NOTE    Eddie Chapman  [id number] DOB: 17-Feb-1961 DOA: 04/25/2021 PCP: Adin Hector, MD   Assessment & Plan:   Active Problems:   Acute on chronic respiratory failure with hypercapnia (HCC)   Acute on chronic hypercapnic respiratory failure: secondary to COPD, obesity hypoventilation syndrome. Has hx of chronic tracheostomy. Continue w/ trach collar during the day and mechanical ventilation at night. Continue on bronchodilators  HTN: continue on BB  Hyperglycemia: no hx of DM, HbA1c is pending.   Chronic pain: continue on home dose of oxycodone   Morbid obesity: BMI 46.1. Complicates overall care & prognosis    DVT prophylaxis: lovenox  Code Status: DNR Family Communication: discussed pt's care w/ pt's girlfriend at bedside and answered her questions Disposition Plan: PT/OT recs CIR, but pt is still considering this   Level of care: Stepdown  Status is: Inpatient  Remains inpatient appropriate because:Unsafe d/c plan and Inpatient level of care appropriate due to severity of illness  Dispo: The patient is from: Home              Anticipated d/c is to: SNF vs CIR              Patient currently is not medically stable to d/c.   Difficult to place patient Yes     Consultants:  ICU  Procedures:   Antimicrobials:    Subjective: Pt c/o fatigue  Objective: Vitals:   04/29/21 0200 04/29/21 0300 04/29/21 0400 04/29/21 0500  BP: 137/87 135/77 (!) 142/91 131/90  Pulse: 89 81 74   Resp: (!) 30 (!) 23 (!) 22 17  Temp:      TempSrc:      SpO2: 98% 98% 96%   Weight:      Height:        Intake/Output Summary (Last 24 hours) at 04/29/2021 0806 Last data filed at 04/29/2021 0522 Gross per 24 hour  Intake --  Output 3350 ml  Net -3350 ml   Filed Weights   04/25/21 0842 04/26/21 0500  Weight: 136.1 kg (!) 145.8 kg    Examination:  General exam: Appears uncomfortable. Morbidly obese Respiratory system: decreased breath sounds b/l   Cardiovascular system: S1/S2+. No clicks. B/l LE edema  Gastrointestinal system: Abd is soft, NT, obese & hypoactive bowel sounds  Central nervous system: alert and oriented. Moves all extremities  Psychiatry: Judgement and insight appear normal. Flat mood and affect     Data Reviewed: I have personally reviewed following labs and imaging studies  CBC: Recent Labs  Lab 04/25/21 0841 04/26/21 0617 04/28/21 0535 04/29/21 0502  WBC 13.4* 10.8* 7.9 8.8  NEUTROABS  --   --  5.6  --   HGB 14.2 12.2* 12.6* 12.1*  HCT 43.8 36.5* 39.7 37.8*  MCV 100.2* 98.9 101.8* 101.3*  PLT 211 189 183 0000000   Basic Metabolic Panel: Recent Labs  Lab 04/25/21 0841 04/26/21 0617 04/28/21 0535 04/29/21 0502  NA 141 141 138 138  K 4.5 4.1 4.2 4.2  CL 95* 94* 94* 97*  CO2 35* 34* 35* 37*  GLUCOSE 172* 127* 123* 109*  BUN '14 20 16 16  '$ CREATININE 0.78 0.85 0.80 0.70  CALCIUM 9.2 9.3 8.9 8.7*  MG 2.1 1.8 2.1  --   PHOS 4.5 4.0 4.1  --    GFR: Estimated Creatinine Clearance: 140 mL/min (by C-G formula based on SCr of 0.7 mg/dL). Liver Function Tests: Recent Labs  Lab 04/25/21 0841  AST 35  ALT 26  ALKPHOS 63  BILITOT 0.9  PROT 7.4  ALBUMIN 3.4*   No results for input(s): LIPASE, AMYLASE in the last 168 hours. No results for input(s): AMMONIA in the last 168 hours. Coagulation Profile: No results for input(s): INR, PROTIME in the last 168 hours. Cardiac Enzymes: No results for input(s): CKTOTAL, CKMB, CKMBINDEX, TROPONINI in the last 168 hours. BNP (last 3 results) No results for input(s): PROBNP in the last 8760 hours. HbA1C: No results for input(s): HGBA1C in the last 72 hours. CBG: Recent Labs  Lab 04/28/21 0750 04/28/21 1119 04/28/21 1548 04/28/21 2139 04/29/21 0751  GLUCAP 122* 121* 123* 116* 123*   Lipid Profile: No results for input(s): CHOL, HDL, LDLCALC, TRIG, CHOLHDL, LDLDIRECT in the last 72 hours. Thyroid Function Tests: No results for input(s): TSH, T4TOTAL,  FREET4, T3FREE, THYROIDAB in the last 72 hours. Anemia Panel: No results for input(s): VITAMINB12, FOLATE, FERRITIN, TIBC, IRON, RETICCTPCT in the last 72 hours. Sepsis Labs: Recent Labs  Lab 04/25/21 0841 04/25/21 0845  PROCALCITON <0.10  --   LATICACIDVEN  --  1.8    Recent Results (from the past 240 hour(s))  Blood culture (routine x 2)     Status: None (Preliminary result)   Collection Time: 04/25/21  8:44 AM   Specimen: BLOOD  Result Value Ref Range Status   Specimen Description BLOOD RIGHT ANTECUBITAL  Final   Special Requests   Final    BOTTLES DRAWN AEROBIC AND ANAEROBIC Blood Culture results may not be optimal due to an inadequate volume of blood received in culture bottles   Culture   Final    NO GROWTH 2 DAYS Performed at [facility], [address], [place], [place] [address]    Report Status PENDING  Incomplete  Resp Panel by RT-PCR (Flu A&B, Covid) Nasopharyngeal Swab     Status: None   Collection Time: 04/25/21  8:48 AM   Specimen: Nasopharyngeal Swab; Nasopharyngeal(NP) swabs in vial transport medium  Result Value Ref Range Status   SARS Coronavirus 2 by RT PCR NEGATIVE NEGATIVE Final    Comment: (NOTE) SARS-CoV-2 target nucleic acids are NOT DETECTED.  The SARS-CoV-2 RNA is generally detectable in upper respiratory specimens during the acute phase of infection. The lowest concentration of SARS-CoV-2 viral copies this assay can detect is 138 copies/mL. A negative result does not preclude SARS-Cov-2 infection and should not be used as the sole basis for treatment or other patient management decisions. A negative result may occur with  improper specimen collection/handling, submission of specimen other than nasopharyngeal swab, presence of viral mutation(s) within the areas targeted by this assay, and inadequate number of viral copies(<138 copies/mL). A negative result must be combined with clinical observations, patient history, and  epidemiological information. The expected result is Negative.  Fact Sheet for Patients:  EntrepreneurPulse.com.au  Fact Sheet for Healthcare Providers:  IncredibleEmployment.be  This test is no t yet approved or cleared by the [place] FDA and  has been authorized for detection and/or diagnosis of SARS-CoV-2 by FDA under an Emergency Use Authorization (EUA). This EUA will remain  in effect (meaning this test can be used) for the duration of the COVID-19 declaration under Section 564(b)(1) of the Act, 21 U.S.C.section 360bbb-3(b)(1), unless the authorization is terminated  or revoked sooner.       Influenza A by PCR NEGATIVE NEGATIVE Final   Influenza B by PCR NEGATIVE NEGATIVE Final    Comment: (NOTE) The Xpert Xpress SARS-CoV-2/FLU/RSV plus assay is  intended as an aid in the diagnosis of influenza from Nasopharyngeal swab specimens and should not be used as a sole basis for treatment. Nasal washings and aspirates are unacceptable for Xpert Xpress SARS-CoV-2/FLU/RSV testing.  Fact Sheet for Patients: EntrepreneurPulse.com.au  Fact Sheet for Healthcare Providers: IncredibleEmployment.be  This test is not yet approved or cleared by the [place] FDA and has been authorized for detection and/or diagnosis of SARS-CoV-2 by FDA under an Emergency Use Authorization (EUA). This EUA will remain in effect (meaning this test can be used) for the duration of the COVID-19 declaration under Section 564(b)(1) of the Act, 21 U.S.C. section 360bbb-3(b)(1), unless the authorization is terminated or revoked.  Performed at [facility], [address] [place], [place] [address]   Blood culture (routine x 2)     Status: None (Preliminary result)   Collection Time: 04/25/21  8:49 AM   Specimen: BLOOD  Result Value Ref Range Status   Specimen Description BLOOD BLOOD RIGHT FOREARM  Final   Special  Requests   Final    BOTTLES DRAWN AEROBIC AND ANAEROBIC Blood Culture results may not be optimal due to an inadequate volume of blood received in culture bottles   Culture   Final    NO GROWTH 2 DAYS Performed at [facility], [address], [place], [place] [address]    Report Status PENDING  Incomplete  MRSA Next Gen by PCR, Nasal     Status: Abnormal   Collection Time: 04/25/21  3:41 PM   Specimen: Nasal Mucosa; Nasal Swab  Result Value Ref Range Status   MRSA by PCR Next Gen DETECTED (A) NOT DETECTED Final    Comment: RESULT CALLED TO, READ BACK BY AND VERIFIED WITH: MATHEW WRIGHT '@1824'$  04/25/21 MJU (NOTE) The GeneXpert MRSA Assay (FDA approved for NASAL specimens only), is one component of a comprehensive MRSA colonization surveillance program. It is not intended to diagnose MRSA infection nor to guide or monitor treatment for MRSA infections. Test performance is not FDA approved in patients less than 78 years old. Performed at [facility], [address] [place], [place] [address]          Radiology Studies: No results found.      Scheduled Meds:  ARIPiprazole  5 mg Oral Daily   budesonide (PULMICORT) nebulizer solution  0.5 mg Nebulization BID   Chlorhexidine Gluconate Cloth  6 each Topical Q0600   enoxaparin (LOVENOX) injection  0.5 mg/kg Subcutaneous Q24H   famotidine  20 mg Oral BID   insulin aspart  0-15 Units Subcutaneous TID WC   insulin aspart  0-5 Units Subcutaneous QHS   ipratropium-albuterol  3 mL Nebulization TID   metoprolol tartrate  25 mg Oral BID   mupirocin ointment  1 application Nasal BID   Continuous Infusions:   LOS: 4 days    Time spent: 30 mins     Wyvonnia Dusky, MD [facility] Pager [phone]  If 7PM-7AM, please contact night-coverage 04/29/2021, 8:06 AM

## 2021-04-29 NOTE — TOC Progression Note (Signed)
Transition of Care [facility]) - Progression Note    Patient Details  Name: Eddie Chapman MRN: [id number] Date of Birth: 1960/10/19  Transition of Care [facility]) CM/SW [place], [place] Phone Number: [phone] 04/29/2021, 11:28 AM  Clinical Narrative:     CSW spoke with Baum,[name] (Significant other) [phone] (Mobile) and updated on PT/OT recommendation for CIR.  CIR will not accept the patient for rehab if the patient needs a vent.  I explained to Ms. Johny Blamer we may find a placement in a SNF but this will depend on whether will accept take a vent/trach patient and if the patient wants to go to SNF. CSW also addressed concerns Ms. Johny Blamer had about caring for the patietn ast home and questions about home health.  CSW explained what home health offers and the difference between home health services paid by insurance and a caregiver.  CSW stated if Ms. Johny Blamer needed a caregiver to assist her everyday she would need to find a private duty caregiver and pay out-of-pocket.  Ms. Johny Blamer verbalized understanding.  CSW updated Attending on conversation with Ms. Johny Blamer.  Attending stated the patient has a consult w/ pulmon to discuss the vent. Patient has expressed a desire to return home with home health, which can be arranged once we confirm vent needs.  Expected Discharge Plan: [place] Barriers to Discharge: Continued Medical Work up  Expected Discharge Plan and Services Expected Discharge Plan: [place] In-house Referral: Clinical Social Work   Post Acute Care Choice: Durable Medical Equipment, Home Health Living arrangements for the past 2 months: [place]                   DME Agency: [employer] Date DME Agency Contacted: 04/27/21 Time DME Agency Contacted: [id number] Representative spoke with at DME Agency: Andree Coss [phone]             Social Determinants of Health ([place]) Interventions    Readmission Risk Interventions No  flowsheet data found.

## 2021-04-29 NOTE — Progress Notes (Signed)
Physical Therapy Treatment Patient Details Name: Eddie Chapman MRN: [id number] DOB: Jan 21, 1961 Today's Date: 04/29/2021    History of Present Illness 60 y/o M who presented to the ED on 04/25/21 with c/c of SOB. Pt was discharged from [facility] to [place] on 04/05/21 following prolonged hospitalization 2/2 acute on chronic hypoxic hypercapnic respiratory failure 2/2 acinotobacter species pneumonia & AECOPD requiring tracheostomy. Pt was d/c home from [place] on 04/24/21, apparently did not have the correct/appropriate DME for O2 management at home and returned to [facility] with respiratory failure. PMH: systolic CHF, pulmonary HTN, OSA, obesity hypoventilation syndrome, chronic trach, COPD, depression, HLD, T8 vertebral fx    PT Comments    Pt continues to have some safety awareness concerns, but from a mobility and gait stand-point did very well.  He does tend to try to go too fast and struggled to keep feet up with walker, consistent cues to slow and be more deliberate; he would transiently make improvements with this but quickly needed very similar cues again.  Pt's O2 remained in >95% t/o the effort, HR generally in the 110 range.  Pt did not have excessive fatigue and overall feels quite well with the effort.  Discussed possible transition home with biggest hurdle being appropriate O2 equipment and the subsequent education/ability to appropriately use/manage them.    Follow Up Recommendations  Home health PT;Supervision/Assistance - 24 hour     Equipment Recommendations   (will need proper O2 equipment before d/c)    Recommendations for Other Services       Precautions / Restrictions Precautions Precautions: Fall Precaution Comments: trach collar, PMV Restrictions Weight Bearing Restrictions: No    Mobility  Bed Mobility Overal bed mobility: Modified Independent Bed Mobility: Supine to Sit;Sit to Supine     Supine to sit: Min guard Sit to supine: Min guard   General bed mobility comments:  Pt did not need any direct assist, used Ues on rial but able to get to/from supine/sitting with relative ease, able to scoot up in flat bed w/o assist    Transfers Overall transfer level: Modified independent Equipment used: Rolling walker (2 wheeled) Transfers: Sit to/from Stand Sit to Stand: Min guard         General transfer comment: Pt able to rise without hesitation from standard height bed, did rely heavily on walker.  Ambulation/Gait Ambulation/Gait assistance: Min guard Gait Distance (Feet): 350 Feet Assistive device: Rolling walker (2 wheeled)       General Gait Details: Pt displayed great motivation and effort and apart from consistent cuing to insure he did not go too fast/get walker in front of him and to slow cadence with more deliberate, consistent steppage   Stairs             Wheelchair Mobility    Modified Rankin (Stroke Patients Only)       Balance Overall balance assessment: Needs assistance Sitting-balance support: Feet supported Sitting balance-Leahy Scale: Good     Standing balance support: Bilateral upper extremity supported;During functional activity Standing balance-Leahy Scale: Fair Standing balance comment: no LOBs, but need for consistent cuing to insure awareness/safety with walker use/positioning                            Cognition Arousal/Alertness: Awake/alert Behavior During Therapy: WFL for tasks assessed/performed;Impulsive Overall Cognitive Status: History of cognitive impairments - at baseline Area of Impairment: Safety/judgement;Attention;Problem solving;Following commands  General Comments: very short lived carry-over from cues, needing regular and consistent cuing      Exercises      General Comments General comments (skin integrity, edema, etc.): Pt's O2 remained high 90s-100 t/o the effort, HR up to ~120, but generally staying in the 100-110 range       Pertinent Vitals/Pain Pain Assessment:  (minimal soreness from being in bed, but does not wish to get into recliner post session)    Home Living                      Prior Function            PT Goals (current goals can now be found in the care plan section) Progress towards PT goals: Progressing toward goals    Frequency    Min 2X/week      PT Plan Frequency needs to be updated    Co-evaluation              AM-PAC PT "6 Clicks" Mobility   Outcome Measure  Help needed turning from your back to your side while in a flat bed without using bedrails?: A Little Help needed moving from lying on your back to sitting on the side of a flat bed without using bedrails?: A Little Help needed moving to and from a bed to a chair (including a wheelchair)?: A Little Help needed standing up from a chair using your arms (e.g., wheelchair or bedside chair)?: A Little Help needed to walk in hospital room?: A Little Help needed climbing 3-5 steps with a railing? : A Little 6 Click Score: 18    End of Session Equipment Utilized During Treatment: Oxygen;Gait belt Activity Tolerance: Patient tolerated treatment well Patient left: with family/visitor present;with call bell/phone within reach;in bed Nurse Communication: Mobility status PT Visit Diagnosis: Muscle weakness (generalized) (M62.81);Difficulty in walking, not elsewhere classified (R26.2);Unsteadiness on feet (R26.81)     Time: [id number] PT Time Calculation (min) (ACUTE ONLY): 31 min  Charges:  $Gait Training: 8-22 mins $Therapeutic Activity: 8-22 mins                     Kreg Shropshire, DPT 04/29/2021, 5:22 PM

## 2021-04-30 LAB — CULTURE, BLOOD (ROUTINE X 2)
Culture: NO GROWTH
Culture: NO GROWTH

## 2021-04-30 LAB — BASIC METABOLIC PANEL
Anion gap: 9 (ref 5–15)
BUN: 16 mg/dL (ref 8–23)
CO2: 33 mmol/L — ABNORMAL HIGH (ref 22–32)
Calcium: 8.8 mg/dL — ABNORMAL LOW (ref 8.9–10.3)
Chloride: 96 mmol/L — ABNORMAL LOW (ref 98–111)
Creatinine, Ser: 0.71 mg/dL (ref 0.61–1.24)
GFR, Estimated: >60 mL/min (ref >60)
Glucose, Bld: 131 mg/dL — ABNORMAL HIGH (ref 70–99)
Potassium: 4.3 mmol/L (ref 3.5–5.1)
Sodium: 138 mmol/L (ref 135–145)

## 2021-04-30 LAB — CBC
HCT: 40.5 % (ref 39.0–52.0)
Hemoglobin: 12.7 g/dL — ABNORMAL LOW (ref 13.0–17.0)
MCH: 31.9 pg (ref 26.0–34.0)
MCHC: 31.4 g/dL (ref 30.0–36.0)
MCV: 101.8 fL — ABNORMAL HIGH (ref 80.0–100.0)
Platelets: 170 10*3/uL (ref 150–400)
RBC: 3.98 MIL/uL — ABNORMAL LOW (ref 4.22–5.81)
RDW: 13.7 % (ref 11.5–15.5)
WBC: 9.5 10*3/uL (ref 4.0–10.5)
nRBC: 0 % (ref 0.0–0.2)

## 2021-04-30 LAB — GLUCOSE, CAPILLARY
Glucose-Capillary: 124 mg/dL — ABNORMAL HIGH (ref 70–99)
Glucose-Capillary: 158 mg/dL — ABNORMAL HIGH (ref 70–99)
Glucose-Capillary: 114 mg/dL — ABNORMAL HIGH (ref 70–99)
Glucose-Capillary: 192 mg/dL — ABNORMAL HIGH (ref 70–99)

## 2021-04-30 LAB — HEMOGLOBIN A1C
Hgb A1c MFr Bld: 5.6 % (ref 4.8–5.6)
Mean Plasma Glucose: 114.02 mg/dL

## 2021-04-30 LAB — MAGNESIUM: Magnesium: 2.1 mg/dL (ref 1.7–2.4)

## 2021-04-30 NOTE — Progress Notes (Signed)
Patient constantly dislodging external catheter. Changed from primo to condom catheter with bag. Patient still dislodging his condom catheter.

## 2021-04-30 NOTE — Progress Notes (Addendum)
1800 Girlfriend taught trach care,suctioning, changing trach ties etc with return demonstration.

## 2021-04-30 NOTE — Progress Notes (Signed)
PROGRESS NOTE    Eddie Chapman  [id number] DOB: 08/27/1961 DOA: 04/25/2021 PCP: Adin Hector, MD   Assessment & Plan:   Active Problems:   Acute on chronic respiratory failure with hypercapnia (HCC)   Acute on chronic hypercapnic respiratory failure: secondary to COPD, obesity hypoventilation syndrome. Has hx of chronic tracheostomy. Continue w/ trach collar during the day and mechanical ventilation at night. Continue on bronchodilators. Pulmon consulted today to find out what equipment pt will need at d/c in regards to trach/pulmon issues.   HTN: continue on metoprolol   Hyperglycemia: HbA1c 5.6. Will continue to monitor BS  Chronic pain: continue on home dose of oxycodone   Morbid obesity: BMI 46.1. Complicates overall care & prognosis    DVT prophylaxis: lovenox  Code Status: DNR Family Communication:  Disposition Plan: PT/OT recs CIR initially and now PT is recs HH   Level of care: Stepdown  Status is: Inpatient  Remains inpatient appropriate because:Unsafe d/c plan and Inpatient level of care appropriate due to severity of illness  Dispo: The patient is from: Home              Anticipated d/c is to: CIR vs HH               Patient currently is not medically stable to d/c.   Difficult to place patient Yes     Consultants:  ICU  Procedures:   Antimicrobials:    Subjective: Pt c/o malaise   Objective: Vitals:   04/30/21 0400 04/30/21 0452 04/30/21 0500 04/30/21 0600  BP: (!) 167/112  (!) 125/94 (!) 139/94  Pulse: (!) 103  97 88  Resp: (!) 24  (!) 23 (!) 26  Temp: 99.6 F (37.6 C)     TempSrc: Oral     SpO2: 98%  98% 94%  Weight:  (!) 151.5 kg    Height:        Intake/Output Summary (Last 24 hours) at 04/30/2021 0816 Last data filed at 04/30/2021 0400 Gross per 24 hour  Intake 780 ml  Output 2800 ml  Net -2020 ml   Filed Weights   04/25/21 0842 04/26/21 0500 04/30/21 0452  Weight: 136.1 kg (!) 145.8 kg (!) 151.5 kg     Examination:  General exam: Appears comfortable. Morbidly obese Respiratory system: diminished breath sounds b/l  Cardiovascular system: S1 & S2+. No rubs or clicks  Gastrointestinal system: Abd is soft, NT, obese & normal bowel sounds  Central nervous system: alert and oriented. Moves all extremities  Psychiatry: Judgement and insight appear normal. Flat mood and affect     Data Reviewed: I have personally reviewed following labs and imaging studies  CBC: Recent Labs  Lab 04/25/21 0841 04/26/21 0617 04/28/21 0535 04/29/21 0502 04/30/21 0529  WBC 13.4* 10.8* 7.9 8.8 9.5  NEUTROABS  --   --  5.6  --   --   HGB 14.2 12.2* 12.6* 12.1* 12.7*  HCT 43.8 36.5* 39.7 37.8* 40.5  MCV 100.2* 98.9 101.8* 101.3* 101.8*  PLT 211 189 183 175 [id number]   Basic Metabolic Panel: Recent Labs  Lab 04/25/21 0841 04/26/21 0617 04/28/21 0535 04/29/21 0502 04/30/21 0529  NA 141 141 138 138 138  K 4.5 4.1 4.2 4.2 4.3  CL 95* 94* 94* 97* 96*  CO2 35* 34* 35* 37* 33*  GLUCOSE 172* 127* 123* 109* 131*  BUN '14 20 16 16 16  '$ CREATININE 0.78 0.85 0.80 0.70 0.71  CALCIUM 9.2 9.3 8.9 8.7*  8.8*  MG 2.1 1.8 2.1  --  2.1  PHOS 4.5 4.0 4.1  --   --    GFR: Estimated Creatinine Clearance: 143.2 mL/min (by C-G formula based on SCr of 0.71 mg/dL). Liver Function Tests: Recent Labs  Lab 04/25/21 0841  AST 35  ALT 26  ALKPHOS 63  BILITOT 0.9  PROT 7.4  ALBUMIN 3.4*   No results for input(s): LIPASE, AMYLASE in the last 168 hours. No results for input(s): AMMONIA in the last 168 hours. Coagulation Profile: No results for input(s): INR, PROTIME in the last 168 hours. Cardiac Enzymes: No results for input(s): CKTOTAL, CKMB, CKMBINDEX, TROPONINI in the last 168 hours. BNP (last 3 results) No results for input(s): PROBNP in the last 8760 hours. HbA1C: Recent Labs    04/29/21 0502  HGBA1C 5.6   CBG: Recent Labs  Lab 04/28/21 2139 04/29/21 0751 04/29/21 1203 04/29/21 2115 04/30/21 0717   GLUCAP 116* 123* 115* 147* 124*   Lipid Profile: No results for input(s): CHOL, HDL, LDLCALC, TRIG, CHOLHDL, LDLDIRECT in the last 72 hours. Thyroid Function Tests: No results for input(s): TSH, T4TOTAL, FREET4, T3FREE, THYROIDAB in the last 72 hours. Anemia Panel: No results for input(s): VITAMINB12, FOLATE, FERRITIN, TIBC, IRON, RETICCTPCT in the last 72 hours. Sepsis Labs: Recent Labs  Lab 04/25/21 0841 04/25/21 0845  PROCALCITON <0.10  --   LATICACIDVEN  --  1.8    Recent Results (from the past 240 hour(s))  Blood culture (routine x 2)     Status: None   Collection Time: 04/25/21  8:44 AM   Specimen: BLOOD  Result Value Ref Range Status   Specimen Description BLOOD RIGHT ANTECUBITAL  Final   Special Requests   Final    BOTTLES DRAWN AEROBIC AND ANAEROBIC Blood Culture results may not be optimal due to an inadequate volume of blood received in culture bottles   Culture   Final    NO GROWTH 5 DAYS Performed at [facility], [address] [place], [place] [address]    Report Status 04/30/2021 FINAL  Final  Resp Panel by RT-PCR (Flu A&B, Covid) Nasopharyngeal Swab     Status: None   Collection Time: 04/25/21  8:48 AM   Specimen: Nasopharyngeal Swab; Nasopharyngeal(NP) swabs in vial transport medium  Result Value Ref Range Status   SARS Coronavirus 2 by RT PCR NEGATIVE NEGATIVE Final    Comment: (NOTE) SARS-CoV-2 target nucleic acids are NOT DETECTED.  The SARS-CoV-2 RNA is generally detectable in upper respiratory specimens during the acute phase of infection. The lowest concentration of SARS-CoV-2 viral copies this assay can detect is 138 copies/mL. A negative result does not preclude SARS-Cov-2 infection and should not be used as the sole basis for treatment or other patient management decisions. A negative result may occur with  improper specimen collection/handling, submission of specimen other than nasopharyngeal swab, presence of viral mutation(s)  within the areas targeted by this assay, and inadequate number of viral copies(<138 copies/mL). A negative result must be combined with clinical observations, patient history, and epidemiological information. The expected result is Negative.  Fact Sheet for Patients:  EntrepreneurPulse.com.au  Fact Sheet for Healthcare Providers:  IncredibleEmployment.be  This test is no t yet approved or cleared by the [place] FDA and  has been authorized for detection and/or diagnosis of SARS-CoV-2 by FDA under an Emergency Use Authorization (EUA). This EUA will remain  in effect (meaning this test can be used) for the duration of the COVID-19 declaration  under Section 564(b)(1) of the Act, 21 U.S.C.section 360bbb-3(b)(1), unless the authorization is terminated  or revoked sooner.       Influenza A by PCR NEGATIVE NEGATIVE Final   Influenza B by PCR NEGATIVE NEGATIVE Final    Comment: (NOTE) The Xpert Xpress SARS-CoV-2/FLU/RSV plus assay is intended as an aid in the diagnosis of influenza from Nasopharyngeal swab specimens and should not be used as a sole basis for treatment. Nasal washings and aspirates are unacceptable for Xpert Xpress SARS-CoV-2/FLU/RSV testing.  Fact Sheet for Patients: EntrepreneurPulse.com.au  Fact Sheet for Healthcare Providers: IncredibleEmployment.be  This test is not yet approved or cleared by the [place] FDA and has been authorized for detection and/or diagnosis of SARS-CoV-2 by FDA under an Emergency Use Authorization (EUA). This EUA will remain in effect (meaning this test can be used) for the duration of the COVID-19 declaration under Section 564(b)(1) of the Act, 21 U.S.C. section 360bbb-3(b)(1), unless the authorization is terminated or revoked.  Performed at [facility], [address] [place], [place] [address]   Blood culture (routine x 2)     Status: None    Collection Time: 04/25/21  8:49 AM   Specimen: BLOOD  Result Value Ref Range Status   Specimen Description BLOOD BLOOD RIGHT FOREARM  Final   Special Requests   Final    BOTTLES DRAWN AEROBIC AND ANAEROBIC Blood Culture results may not be optimal due to an inadequate volume of blood received in culture bottles   Culture   Final    NO GROWTH 5 DAYS Performed at [facility], [address], [place], [place] [address]    Report Status 04/30/2021 FINAL  Final  MRSA Next Gen by PCR, Nasal     Status: Abnormal   Collection Time: 04/25/21  3:41 PM   Specimen: Nasal Mucosa; Nasal Swab  Result Value Ref Range Status   MRSA by PCR Next Gen DETECTED (A) NOT DETECTED Final    Comment: RESULT CALLED TO, READ BACK BY AND VERIFIED WITH: MATHEW WRIGHT '@[id number]'$  04/25/21 MJU (NOTE) The GeneXpert MRSA Assay (FDA approved for NASAL specimens only), is one component of a comprehensive MRSA colonization surveillance program. It is not intended to diagnose MRSA infection nor to guide or monitor treatment for MRSA infections. Test performance is not FDA approved in patients less than 62 years old. Performed at [facility], [address], [place], [place] [address]          Radiology Studies: No results found.      Scheduled Meds:  ARIPiprazole  5 mg Oral Daily   budesonide (PULMICORT) nebulizer solution  0.5 mg Nebulization BID   Chlorhexidine Gluconate Cloth  6 each Topical Q0600   enoxaparin (LOVENOX) injection  0.5 mg/kg Subcutaneous Q24H   famotidine  20 mg Oral BID   insulin aspart  0-15 Units Subcutaneous TID WC   insulin aspart  0-5 Units Subcutaneous QHS   ipratropium-albuterol  3 mL Nebulization TID   metoprolol tartrate  25 mg Oral BID   mupirocin ointment  1 application Nasal BID   Continuous Infusions:   LOS: 5 days    Time spent: 25 mins     Wyvonnia Dusky, MD [facility] Pager [phone]  If 7PM-7AM, please contact  night-coverage 04/30/2021, 8:16 AM

## 2021-04-30 NOTE — Progress Notes (Signed)
Occupational Therapy Treatment Patient Details Name: ELHADJI KLINEFELTER MRN: [id number] DOB: [birth year] Today's Date: 04/30/2021    History of present illness 60 y/o M who presented to the ED on 04/25/21 with c/c of SOB. Pt was discharged from [facility] to [place] on 04/05/21 following prolonged hospitalization 2/2 acute on chronic hypoxic hypercapnic respiratory failure 2/2 acinotobacter species pneumonia & AECOPD requiring tracheostomy. Pt was d/c home from [facility] on 04/24/21, apparently did not have the correct/appropriate DME for O2 management at home and returned to [facility] with respiratory failure. PMH: systolic CHF, pulmonary HTN, OSA, obesity hypoventilation syndrome, chronic trach, COPD, depression, HLD, T8 vertebral fx   OT comments  Pt seen for OT treatment on this date. Upon arrival to room, pt c/o of condom catheter leak and requesting assistance from this author. This date, pt required MIN GUARD for bed mobility (with HOB elevated), MIN GUARD for sit<>stand transfers with RW, MIN GUARD for UB dressing while standing, and MOD A for sit>stand LB bathing (per OT evaluation June 2022, pt requires assistance from wife for LB dressing and bathing at baseline). While pt continues to present with decreased balance, activity tolerance, and safety awareness, pt is progressing towards goals and appears to be nearing baseline independence to perform functional mobility and ADLs. Frequency changed and discharge recommendation updated to home health OT and 24/7 supervision/assistance.    Follow Up Recommendations  Home health OT;Supervision/Assistance - 24 hour    Equipment Recommendations  Wheelchair (measurements OT)       Precautions / Restrictions Precautions Precautions: Fall Precaution Comments: trach collar, PMV Restrictions Weight Bearing Restrictions: No       Mobility Bed Mobility Overal bed mobility: Modified Independent Bed Mobility: Supine to Sit;Sit to Supine     Supine to sit: Min  guard;HOB elevated Sit to supine: Min guard;HOB elevated        Transfers Overall transfer level: Needs assistance Equipment used: Rolling walker (2 wheeled) Transfers: Sit to/from Stand Sit to Stand: Min guard         General transfer comment: verbal cues for safe hand placement and activity pacing    Balance Overall balance assessment: Needs assistance Sitting-balance support: Feet supported;No upper extremity supported Sitting balance-Leahy Scale: Good Sitting balance - Comments: no loss of balance at EOB while participating in seated LB bathing   Standing balance support: Bilateral upper extremity supported;During functional activity Standing balance-Leahy Scale: Fair Standing balance comment: heavy reliance on rolling walker for UE support in standing                           ADL either performed or assessed with clinical judgement   ADL Overall ADL's : Needs assistance/impaired     Grooming: Wash/dry hands;Supervision/safety;Set up;Sitting Grooming Details (indicate cue type and reason): at edge of bed     Lower Body Bathing: Moderate assistance;Sit to/from stand Lower Body Bathing Details (indicate cue type and reason): pt able to wash anterior aspects of LE while seated EOB. Pt requires assists to wash posterior aspects of LE while standing Upper Body Dressing : Set up;Min guard;Standing Upper Body Dressing Details (indicate cue type and reason): to don/doff hospital gown                         Cognition Arousal/Alertness: Awake/alert Behavior During Therapy: [facility] for tasks assessed/performed Overall Cognitive Status: No family/caregiver present to determine baseline cognitive functioning Area of Impairment: Safety/judgement;Attention;Problem solving;Following commands  Current Attention Level: Focused Memory: Decreased short-term memory;Decreased recall of precautions Following Commands: Follows one step  commands consistently Safety/Judgement: Decreased awareness of deficits;Decreased awareness of safety Awareness: Intellectual   General Comments: patient is able to follow all commands with extra time. patient needs cues for attention to task with occasional tangential speech. Also requires cues for activity pacing                   Pertinent Vitals/ Pain       Pain Assessment: No/denies pain         Frequency  Min 2X/week        Progress Toward Goals  OT Goals(current goals can now be found in the care plan section)  Progress towards OT goals: Progressing toward goals  Acute Rehab OT Goals Patient Stated Goal: to go home OT Goal Formulation: With patient Time For Goal Achievement: 05/12/21 Potential to Achieve Goals: Good  Plan Discharge plan needs to be updated;Frequency needs to be updated       AM-PAC OT "6 Clicks" Daily Activity     Outcome Measure   Help from another person eating meals?: None Help from another person taking care of personal grooming?: A Little Help from another person toileting, which includes using toliet, bedpan, or urinal?: A Lot Help from another person bathing (including washing, rinsing, drying)?: A Lot Help from another person to put on and taking off regular upper body clothing?: A Little Help from another person to put on and taking off regular lower body clothing?: A Lot 6 Click Score: 16    End of Session Equipment Utilized During Treatment: Gait belt;Rolling walker;Oxygen  OT Visit Diagnosis: Other abnormalities of gait and mobility (R26.89);Muscle weakness (generalized) (M62.81);Other symptoms and signs involving cognitive function   Activity Tolerance Patient tolerated treatment well   Patient Left in chair;in bed;with call bell/phone within reach;with bed alarm set   Nurse Communication Mobility status        Time: [id number] OT Time Calculation (min): 30 min  Charges: OT General Charges $OT Visit: 1 Visit OT  Treatments $Self Care/Home Management : 23-37 mins  Fredirick Maudlin, [place]

## 2021-04-30 NOTE — Progress Notes (Addendum)
Good day. Begrudgingly sat up in chair x 2 hours. Refuses to get up for meals. Refused to work with PT or OT. Incontinent all day and would not leave external catheter alone. External catheter replaced 7 times. 1600 Girlfriend in. Girlfriend refused to work with RT re guarding tracheostomy care. Also stated she understood suctioning and all of the care needed for tracheostomy. Patient ate all day, 100% meals, snacks, and food brought in by girlfriend. Incontinent of stool and urine.

## 2021-04-30 NOTE — Progress Notes (Signed)
NAME:  Eddie Chapman, MRN:  [id number], DOB:  01-19-1961, LOS: 5 ADMISSION DATE:  04/25/2021, CONSULTATION DATE:04/25/2021 REFERRING MD: Dr. Quentin Cornwall, CHIEF COMPLAINT:  Shortness of Breath  History of Present Illness:  This is a 60 yo male with complex comorbid history including systolic CHF chronically, pulmonary hypertension, obstructive sleep apnea, obesity hypoventilation syndrome, chronic tracheostomy, and COPD.  He presented to [facility] ER on 07/27 via EMS from home with c/o shortness of breath.  He was discharged from [facility] to [place] on 07/7 following prolonged hospitalization due to acute on chronic hypoxic hypercapnic respiratory failure secondary to acinotobacter species pneumonia and AECOPD requiring tracheostomy.  He was discharged from [facility] to home on 07/26.  Per ED notes pts family reports they DO NOT know how to take care of his tracheostomy at home.  EMS reported upon their arrival at pts home pt diaphoretic with O2 sats 85% on RA and diaphoretic, therefore he was placed on 10L of O2 and received 125 mg solumedrol.  He remained alert and oriented.   ED Course Upon arrival to the ER pt noted to have increased work of breathing despite 10L O2 via trach collar.  VBG revealed pH 7.27/CO2 97/acid-base excess 12.5/bicarb 44.5.  He was therefore placed on mechanical ventilation via chronic tracheostomy.  CXR revealed chronic lung changes.  He received azithromycin, cefepime, vancomycin, and duoneb x1.  PCCM team contacted for ICU admission.    Pertinent  Medical History  COPD OSA Morbid Obesity Hypertension Hyperlipidemia GERD Depression T8 Vertebral Fracture Subarachnoid Hemorrhage  Chronic Tracheostomy  Significant Hospital Events: Including procedures, antibiotic start and stop dates in addition to other pertinent events   Pt admitted to ICU on mechanical ventilation via chronic tracheostomy  04/30/21- - met with wife and patient reviewed NIV needs, set up education session for  patient and family to confirm no issues using bipap at home.    Interim History / Subjective:    Plan to wean to PS mode Needs vent to stay alive now Resp failure on vent Patient is awake    Objective   Blood pressure (!) 139/94, pulse 88, temperature 99.6 F (37.6 C), temperature source Oral, resp. rate (!) 26, height '5\' 10"'  (1.778 m), weight (!) 151.5 kg, SpO2 94 %.    Vent Mode: PSV FiO2 (%):  [28 %] 28 % PEEP:  [5 cmH20] 5 cmH20 Pressure Support:  [10 cmH20] 10 cmH20   Intake/Output Summary (Last 24 hours) at 04/30/2021 0911 Last data filed at 04/30/2021 0400 Gross per 24 hour  Intake 780 ml  Output 2800 ml  Net -2020 ml    Filed Weights   04/25/21 0842 04/26/21 0500 04/30/21 0452  Weight: 136.1 kg (!) 145.8 kg (!) 151.5 kg     Review of Systems: +SOB Alert and awake Other:  All other systems negative  PHYSICAL EXAMINATION:  GENERAL: ill appearing, +resp distress on vent HEAD: Normocephalic, atraumatic.  EYES: Pupils equal, round, reactive to light.  No scleral icterus.  MOUTH: Moist mucosal membrane. NECK: Supple. S/p trach PULMONARY: +rhonchi CARDIOVASCULAR: S1 and S2. Regular rate and rhythm. No murmurs, rubs, or gallops.  GASTROINTESTINAL: Soft, nontender, -distended. Positive bowel sounds.  MUSCULOSKELETAL: No swelling, clubbing, or edema.  NEUROLOGIC: alert and awake SKIN:intact,warm,dry      Assessment & Plan:  Severe hypercapnic resp failure  secondary to AECOPD amd Obesity Hypoventilation Syndrome Mechanical Ventilation via Chronic Tracheostomy  Hx: Pulmonary HTN, Morbid Obesity Pickwikian syndrome Wean to PS mode   SEVERE COPD  EXACERBATION -continue IV steroids as prescribed -continue NEB THERAPY as prescribed -morphine as needed -wean fio2 as needed and tolerated  ELECTROLYTES -follow labs as needed -replace as needed -pharmacy consultation and following      Best Practice (right click and "Reselect all SmartList Selections"  daily)   Diet/type: NPO; if pt remains mechanical ventilated via tracheostomy in the next 24hrs will place order for NG tube for TF's  DVT prophylaxis: LMWH GI prophylaxis: H2B Lines: N/A Foley:  N/A Code Status:  DNR Last date of multidisciplinary goals of care discussion [04/25/2021]  Labs   CBC: Recent Labs  Lab 04/25/21 0841 04/26/21 0617 04/28/21 0535 04/29/21 0502 04/30/21 0529  WBC 13.4* 10.8* 7.9 8.8 9.5  NEUTROABS  --   --  5.6  --   --   HGB 14.2 12.2* 12.6* 12.1* 12.7*  HCT 43.8 36.5* 39.7 37.8* 40.5  MCV 100.2* 98.9 101.8* 101.3* 101.8*  PLT 211 189 183 175 170     Basic Metabolic Panel: Recent Labs  Lab 04/25/21 0841 04/26/21 0617 04/28/21 0535 04/29/21 0502 04/30/21 0529  NA 141 141 138 138 138  K 4.5 4.1 4.2 4.2 4.3  CL 95* 94* 94* 97* 96*  CO2 35* 34* 35* 37* 33*  GLUCOSE 172* 127* 123* 109* 131*  BUN '14 20 16 16 16  ' CREATININE 0.78 0.85 0.80 0.70 0.71  CALCIUM 9.2 9.3 8.9 8.7* 8.8*  MG 2.1 1.8 2.1  --  2.1  PHOS 4.5 4.0 4.1  --   --     GFR: Estimated Creatinine Clearance: 143.2 mL/min (by C-G formula based on SCr of 0.71 mg/dL). Recent Labs  Lab 04/25/21 0841 04/25/21 0845 04/26/21 0617 04/28/21 0535 04/29/21 0502 04/30/21 0529  PROCALCITON <0.10  --   --   --   --   --   WBC 13.4*  --  10.8* 7.9 8.8 9.5  LATICACIDVEN  --  1.8  --   --   --   --      Liver Function Tests: Recent Labs  Lab 04/25/21 0841  AST 35  ALT 26  ALKPHOS 63  BILITOT 0.9  PROT 7.4  ALBUMIN 3.4*    ABG    Component Value Date/Time   PHART 7.55 (H) 04/25/2021 1652   PCO2ART 44 04/25/2021 1652   PO2ART 83 04/25/2021 1652   HCO3 38.5 (H) 04/25/2021 1652   O2SAT 97.4 04/25/2021 1652        DVT/GI PRX  assessed I Assessed the need for Labs I Assessed the need for Foley I Assessed the need for Central Venous Line Family Discussion when available I Assessed the need for Mobilization I made an Assessment of medications to be adjusted  accordingly Safety Risk assessment completed  CASE DISCUSSED IN MULTIDISCIPLINARY ROUNDS WITH ICU TEAM     Critical Care Time devoted to patient care services described in this note is 34 minutes.  Critical care was necessary to treat /prevent imminent and life-threatening deterioration.   Ottie Glazier, M.D.  [facility] [place] [facility]

## 2021-04-30 NOTE — Progress Notes (Signed)
1900 Patient requested a bedpan. NT suggested she get up to [facility]. Patient became mad and stated if she(NT) wouldn't put him on the bedpan" I will shit in the bed". Placed on bedpan. Patient very rude and hateful to the NT.

## 2021-04-30 NOTE — Progress Notes (Signed)
Physical Therapy Treatment Patient Details Name: Eddie Chapman MRN: [id number] DOB: 11-Mar-1961 Today's Date: 04/30/2021    History of Present Illness 60 y/o M who presented to the ED on 04/25/21 with c/c of SOB. Pt was discharged from [facility] to [place] on 04/05/21 following prolonged hospitalization 2/2 acute on chronic hypoxic hypercapnic respiratory failure 2/2 acinotobacter species pneumonia & AECOPD requiring tracheostomy. Pt was d/c home from [place] on 04/24/21, apparently did not have the correct/appropriate DME for O2 management at home and returned to [facility] with respiratory failure. PMH: systolic CHF, pulmonary HTN, OSA, obesity hypoventilation syndrome, chronic trach, COPD, depression, HLD, T8 vertebral fx    PT Comments    Patient agreeable to PT but complains of fatigue from recently getting out of bed to chair with staff assistance. Patient sitting up in chair on arrival to room. Patient was able to stand using rolling walker from recliner chair without physical assistance, supervision provided for safety with cues for task initiation. Patient with fair standing balance with UE support on rolling walker with no significant change in vitals noted with standing activity. Patient declined ambulation due to fatigue. Patient educated on routine ambulation for conditioning.  Recommend to continue PT to maximize independence and address functional limitations remaining.    Follow Up Recommendations  Home health PT;Supervision/Assistance - 24 hour     Equipment Recommendations  None recommended by PT    Recommendations for Other Services       Precautions / Restrictions Precautions Precautions: Fall Precaution Comments: trach collar, PMV Restrictions Weight Bearing Restrictions: No    Mobility  Bed Mobility               General bed mobility comments: not addressed as patient sitting up in chair on arrival to room    Transfers Overall transfer level: Needs assistance Equipment  used: Rolling walker (2 wheeled) Transfers: Sit to/from Stand Sit to Stand: Supervision         General transfer comment: supervision for safety with no physical assistance required for standing. cues for safety and task initiation provided.  Ambulation/Gait             General Gait Details: patient declined ambulation this session, stating he is fatigued from getting out of bed to chair recently with staff assistance   Stairs             Wheelchair Mobility    Modified Rankin (Stroke Patients Only)       Balance Overall balance assessment: Needs assistance Sitting-balance support: Feet supported;No upper extremity supported Sitting balance-Leahy Scale: Good Sitting balance - Comments: no loss of balance in sitting position   Standing balance support: Bilateral upper extremity supported Standing balance-Leahy Scale: Fair Standing balance comment: heavy reliance on rolling walker for support in standing                            Cognition Arousal/Alertness: Awake/alert Behavior During Therapy: WFL for tasks assessed/performed Overall Cognitive Status: No family/caregiver present to determine baseline cognitive functioning                                 General Comments: patient is able to follow all commands with extra time. patient needs cues for attention to task with occasional tangential speech      Exercises General Exercises - Lower Extremity Ankle Circles/Pumps: AROM;Strengthening;Both;10 reps;Seated Long Arc Quad: AROM;Strengthening;Right;5  reps;Seated Hip ABduction/ADduction: AROM;Strengthening;Both;10 reps;Seated Straight Leg Raises: AROM;Strengthening;Both;10 reps;Seated Other Exercises Other Exercises: verbal cues for exercise technique for strengthening. no significant change in vitals noted with activity    General Comments        Pertinent Vitals/Pain Pain Assessment: No/denies pain    Home Living                       Prior Function            PT Goals (current goals can now be found in the care plan section) Acute Rehab PT Goals Patient Stated Goal: to go home PT Goal Formulation: With patient Time For Goal Achievement: 05/11/21 Potential to Achieve Goals: Fair Progress towards PT goals: Progressing toward goals    Frequency    Min 2X/week      PT Plan Current plan remains appropriate    Co-evaluation              AM-PAC PT "6 Clicks" Mobility   Outcome Measure  Help needed turning from your back to your side while in a flat bed without using bedrails?: A Little Help needed moving from lying on your back to sitting on the side of a flat bed without using bedrails?: A Little Help needed moving to and from a bed to a chair (including a wheelchair)?: A Little Help needed standing up from a chair using your arms (e.g., wheelchair or bedside chair)?: A Little Help needed to walk in hospital room?: A Little Help needed climbing 3-5 steps with a railing? : A Little 6 Click Score: 18    End of Session Equipment Utilized During Treatment: Oxygen Activity Tolerance: Patient limited by fatigue Patient left: in chair;with call bell/phone within reach Nurse Communication: Mobility status PT Visit Diagnosis: Muscle weakness (generalized) (M62.81);Difficulty in walking, not elsewhere classified (R26.2);Unsteadiness on feet (R26.81)     Time: [id number] PT Time Calculation (min) (ACUTE ONLY): 23 min  Charges:  $Therapeutic Activity: 23-37 mins                     Minna Merritts, PT, MPT    Percell Locus 04/30/2021, 1:07 PM

## 2021-05-01 LAB — BASIC METABOLIC PANEL
Anion gap: 7 (ref 5–15)
BUN: 18 mg/dL (ref 8–23)
CO2: 35 mmol/L — ABNORMAL HIGH (ref 22–32)
Calcium: 8.9 mg/dL (ref 8.9–10.3)
Chloride: 98 mmol/L (ref 98–111)
Creatinine, Ser: 0.81 mg/dL (ref 0.61–1.24)
GFR, Estimated: >60 mL/min (ref >60)
Glucose, Bld: 147 mg/dL — ABNORMAL HIGH (ref 70–99)
Potassium: 4.5 mmol/L (ref 3.5–5.1)
Sodium: 140 mmol/L (ref 135–145)

## 2021-05-01 LAB — GLUCOSE, CAPILLARY
Glucose-Capillary: 198 mg/dL — ABNORMAL HIGH (ref 70–99)
Glucose-Capillary: 166 mg/dL — ABNORMAL HIGH (ref 70–99)
Glucose-Capillary: 123 mg/dL — ABNORMAL HIGH (ref 70–99)
Glucose-Capillary: 144 mg/dL — ABNORMAL HIGH (ref 70–99)

## 2021-05-01 LAB — CBC
HCT: 39.3 % (ref 39.0–52.0)
Hemoglobin: 12.4 g/dL — ABNORMAL LOW (ref 13.0–17.0)
MCH: 32.1 pg (ref 26.0–34.0)
MCHC: 31.6 g/dL (ref 30.0–36.0)
MCV: 101.8 fL — ABNORMAL HIGH (ref 80.0–100.0)
Platelets: 170 10*3/uL (ref 150–400)
RBC: 3.86 MIL/uL — ABNORMAL LOW (ref 4.22–5.81)
RDW: 13.8 % (ref 11.5–15.5)
WBC: 10.5 10*3/uL (ref 4.0–10.5)
nRBC: 0 % (ref 0.0–0.2)

## 2021-05-01 MED ORDER — ENOXAPARIN SODIUM 80 MG/0.8ML IJ SOSY
0.5000 mg/kg | PREFILLED_SYRINGE | INTRAMUSCULAR | Status: DC
  Administered 2021-05-01 – 2021-05-03 (×3): 75 mg via SUBCUTANEOUS
  Filled 2021-05-01 (×3): qty 0.8

## 2021-05-01 NOTE — Progress Notes (Signed)
PROGRESS NOTE    HPI was taken from Eddie Chapman: This is a 60 yo male with complex comorbid history including systolic CHF chronically, pulmonary hypertension, obstructive sleep apnea, obesity hypoventilation syndrome, chronic tracheostomy, and COPD.   He presented to [facility] ER on 07/27 via EMS from home with c/o shortness of breath.  He was discharged from [facility] to [place] on 07/7 following prolonged hospitalization due to acute on chronic hypoxic hypercapnic respiratory failure secondary to acinotobacter species pneumonia and AECOPD requiring tracheostomy.  He was discharged from [facility] to home on 07/26.  Per ED notes pts family reports they DO NOT know how to take care of his tracheostomy at home.  EMS reported upon their arrival at pts home pt diaphoretic with O2 sats 85% on RA and diaphoretic, therefore he was placed on 10L of O2 and received 125 mg solumedrol.  He remained alert and oriented.   ED Course Upon arrival to the ER pt noted to have increased work of breathing despite 10L O2 via trach collar.  VBG revealed pH 7.27/CO2 97/acid-base excess 12.5/bicarb 44.5.  He was therefore placed on mechanical ventilation via chronic tracheostomy.  CXR revealed chronic lung changes.  He received azithromycin, cefepime, vancomycin, and duoneb x1.  PCCM team contacted for ICU admission  As per Dr. Lanney Chapman: Pt admitted to ICU on mechanical ventilation via chronic tracheostomy  04/30/21- - met with wife and patient reviewed NIV needs, set up education session for patient and family to confirm no issues using bipap at home.   Hospital course from Dr. Jimmye Chapman 7/30-05/01/21: Pt presented w/ shortness of breath likely secondary to COPD & obesity hypoventilation syndrome. Pt has hx of chronic tracheostomy. Pt is on trach collar and ventilator qhs. Pulmon is following and will set up education session for pt patient and family for resp/pulmon needs for home. PT/OT initially recommended CIR but now PT recs home  health. Home health has not been set up yet    Eddie Chapman  MWN:[id number] DOB: [birth year] DOA: 04/25/2021 PCP: Eddie Hector, MD   Assessment & Plan:   Active Problems:   Acute on chronic respiratory failure with hypercapnia (HCC)   Acute on chronic hypercapnic respiratory failure: secondary to COPD, obesity hypoventilation syndrome. Has hx of chronic tracheostomy. Continue w/ trach collar during the day and mechanical ventilation at night. Continue on bronchodilators. Pulmon following and recs apprec    HTN: continue on metoprolol  Hyperglycemia: HbA1c 5.6   Chronic pain: continue on home dose of oxycodone   Morbid obesity: BMI 46.1. Complicates overall care & prognosis    DVT prophylaxis: lovenox  Code Status: DNR Family Communication:  Disposition Plan: PT/OT recs CIR initially and now PT is recs HH   Level of care: Stepdown  Status is: Inpatient  Remains inpatient appropriate because:Unsafe d/c plan and Inpatient level of care appropriate due to severity of illness  Dispo: The patient is from: Home              Anticipated d/c is to: CIR vs HH               Patient currently is not medically stable to d/c.   Difficult to place patient Yes     Consultants:  ICU  Procedures:   Antimicrobials:    Subjective: Pt c/o fatigue   Objective: Vitals:   05/01/21 0300 05/01/21 0400 05/01/21 0500 05/01/21 0600  BP:  136/86  (!) 149/100  Pulse: 91 86 88 91  Resp: (!)  27 (!) 28 (!) 23 (!) 21  Temp:  98.4 F (36.9 C)    TempSrc:  Oral    SpO2: 93% 94% 97% 100%  Weight:   (!) 151.4 kg   Height:        Intake/Output Summary (Last 24 hours) at 05/01/2021 0844 Last data filed at 05/01/2021 0500 Gross per 24 hour  Intake 900 ml  Output 1400 ml  Net -500 ml   Filed Weights   04/26/21 0500 04/30/21 0452 05/01/21 0500  Weight: (!) 145.8 kg (!) 151.5 kg (!) 151.4 kg    Examination:  General exam: Appears uncomfortable Respiratory system: decreased breath  sounds b/l Cardiovascular system: S1/S2+. No rubs or gallops   Gastrointestinal system: Abd is soft, obese & hypoactive bowel sounds Central nervous system: alert and oriented. Moves all extremities  Psychiatry: Judgement and insight appears normal. Flat mood and affect     Data Reviewed: I have personally reviewed following labs and imaging studies  CBC: Recent Labs  Lab 04/26/21 0617 04/28/21 0535 04/29/21 0502 04/30/21 0529 05/01/21 0428  WBC 10.8* 7.9 8.8 9.5 10.5  NEUTROABS  --  5.6  --   --   --   HGB 12.2* 12.6* 12.1* 12.7* 12.4*  HCT 36.5* 39.7 37.8* 40.5 39.3  MCV 98.9 101.8* 101.3* 101.8* 101.8*  PLT 189 183 175 170 161   Basic Metabolic Panel: Recent Labs  Lab 04/25/21 0841 04/26/21 0617 04/28/21 0535 04/29/21 0502 04/30/21 0529 05/01/21 0428  NA 141 141 138 138 138 140  K 4.5 4.1 4.2 4.2 4.3 4.5  CL 95* 94* 94* 97* 96* 98  CO2 35* 34* 35* 37* 33* 35*  GLUCOSE 172* 127* 123* 109* 131* 147*  BUN _0 CREATININE 0.78 0.85 0.80 0.70 0.71 0.81  CALCIUM 9.2 9.3 8.9 8.7* 8.8* 8.9  MG 2.1 1.8 2.1  --  2.1  --   PHOS 4.5 4.0 4.1  --   --   --    GFR: Estimated Creatinine Clearance: 141.4 mL/min (by C-G formula based on SCr of 0.81 mg/dL). Liver Function Tests: Recent Labs  Lab 04/25/21 0841  AST 35  ALT 26  ALKPHOS 63  BILITOT 0.9  PROT 7.4  ALBUMIN 3.4*   No results for input(s): LIPASE, AMYLASE in the last 168 hours. No results for input(s): AMMONIA in the last 168 hours. Coagulation Profile: No results for input(s): INR, PROTIME in the last 168 hours. Cardiac Enzymes: No results for input(s): CKTOTAL, CKMB, CKMBINDEX, TROPONINI in the last 168 hours. BNP (last 3 results) No results for input(s): PROBNP in the last 8760 hours. HbA1C: Recent Labs    04/29/21 0502  HGBA1C 5.6   CBG: Recent Labs  Lab 04/30/21 0717 04/30/21 1223 04/30/21 1632 04/30/21 2055 05/01/21 0731  GLUCAP 124* 158* 114* 192* 198*   Lipid  Profile: No results for input(s): CHOL, HDL, LDLCALC, TRIG, CHOLHDL, LDLDIRECT in the last 72 hours. Thyroid Function Tests: No results for input(s): TSH, T4TOTAL, FREET4, T3FREE, THYROIDAB in the last 72 hours. Anemia Panel: No results for input(s): VITAMINB12, FOLATE, FERRITIN, TIBC, IRON, RETICCTPCT in the last 72 hours. Sepsis Labs: Recent Labs  Lab 04/25/21 0841 04/25/21 0845  PROCALCITON <0.10  --   LATICACIDVEN  --  1.8    Recent Results (from the past 240 hour(s))  Blood culture (routine x 2)     Status: None   Collection Time: 04/25/21  8:44 AM   Specimen: BLOOD  Result Value Ref Range Status   Specimen Description BLOOD RIGHT ANTECUBITAL  Final   Special Requests   Final    BOTTLES DRAWN AEROBIC AND ANAEROBIC Blood Culture results may not be optimal due to an inadequate volume of blood received in culture bottles   Culture   Final    NO GROWTH 5 DAYS Performed at [facility], [address] [place], [place] [address]    Report Status 04/30/2021 FINAL  Final  Resp Panel by RT-PCR (Flu A&B, Covid) Nasopharyngeal Swab     Status: None   Collection Time: 04/25/21  8:48 AM   Specimen: Nasopharyngeal Swab; Nasopharyngeal(Eddie) swabs in vial transport medium  Result Value Ref Range Status   SARS Coronavirus 2 by RT PCR NEGATIVE NEGATIVE Final    Comment: (NOTE) SARS-CoV-2 target nucleic acids are NOT DETECTED.  The SARS-CoV-2 RNA is generally detectable in upper respiratory specimens during the acute phase of infection. The lowest concentration of SARS-CoV-2 viral copies this assay can detect is 138 copies/mL. A negative result does not preclude SARS-Cov-2 infection and should not be used as the sole basis for treatment or other patient management decisions. A negative result may occur with  improper specimen collection/handling, submission of specimen other than nasopharyngeal swab, presence of viral mutation(s) within the areas targeted by this assay, and  inadequate number of viral copies(<138 copies/mL). A negative result must be combined with clinical observations, patient history, and epidemiological information. The expected result is Negative.  Fact Sheet for Patients:  EntrepreneurPulse.com.au  Fact Sheet for Healthcare Providers:  IncredibleEmployment.be  This test is no t yet approved or cleared by the [place] FDA and  has been authorized for detection and/or diagnosis of SARS-CoV-2 by FDA under an Emergency Use Authorization (EUA). This EUA will remain  in effect (meaning this test can be used) for the duration of the COVID-19 declaration under Section 564(b)(1) of the Act, 21 U.S.C.section 360bbb-3(b)(1), unless the authorization is terminated  or revoked sooner.       Influenza A by PCR NEGATIVE NEGATIVE Final   Influenza B by PCR NEGATIVE NEGATIVE Final    Comment: (NOTE) The Xpert Xpress SARS-CoV-2/FLU/RSV plus assay is intended as an aid in the diagnosis of influenza from Nasopharyngeal swab specimens and should not be used as a sole basis for treatment. Nasal washings and aspirates are unacceptable for Xpert Xpress SARS-CoV-2/FLU/RSV testing.  Fact Sheet for Patients: EntrepreneurPulse.com.au  Fact Sheet for Healthcare Providers: IncredibleEmployment.be  This test is not yet approved or cleared by the [place] FDA and has been authorized for detection and/or diagnosis of SARS-CoV-2 by FDA under an Emergency Use Authorization (EUA). This EUA will remain in effect (meaning this test can be used) for the duration of the COVID-19 declaration under Section 564(b)(1) of the Act, 21 U.S.C. section 360bbb-3(b)(1), unless the authorization is terminated or revoked.  Performed at [facility], [address] [place], [place] [address]   Blood culture (routine x 2)     Status: None   Collection Time: 04/25/21  8:49 AM    Specimen: BLOOD  Result Value Ref Range Status   Specimen Description BLOOD BLOOD RIGHT FOREARM  Final   Special Requests   Final    BOTTLES DRAWN AEROBIC AND ANAEROBIC Blood Culture results may not be optimal due to an inadequate volume of blood received in culture bottles   Culture   Final    NO GROWTH 5 DAYS Performed at [facility], [place]  [address], [place], [place] [address]    Report Status 04/30/2021 FINAL  Final  MRSA Next Gen by PCR, Nasal     Status: Abnormal   Collection Time: 04/25/21  3:41 PM   Specimen: Nasal Mucosa; Nasal Swab  Result Value Ref Range Status   MRSA by PCR Next Gen DETECTED (A) NOT DETECTED Final    Comment: RESULT CALLED TO, READ BACK BY AND VERIFIED WITH: MATHEW WRIGHT _0  04/25/21 MJU (NOTE) The GeneXpert MRSA Assay (FDA approved for NASAL specimens only), is one component of a comprehensive MRSA colonization surveillance program. It is not intended to diagnose MRSA infection nor to guide or monitor treatment for MRSA infections. Test performance is not FDA approved in patients less than 85 years old. Performed at [facility], [address] [place],  [address]          Radiology Studies: No results found.      Scheduled Meds:  ARIPiprazole  5 mg Oral Daily   budesonide (PULMICORT) nebulizer solution  0.5 mg Nebulization BID   enoxaparin (LOVENOX) injection  0.5 mg/kg Subcutaneous Q24H   famotidine  20 mg Oral BID   insulin aspart  0-15 Units Subcutaneous TID WC   insulin aspart  0-5 Units Subcutaneous QHS   ipratropium-albuterol  3 mL Nebulization TID   metoprolol tartrate  25 mg Oral BID   Continuous Infusions:   LOS: 6 days    Time spent: 25 mins     Wyvonnia Dusky, MD [facility] Pager [phone]  If 7PM-7AM, please contact night-coverage 05/01/2021, 8:44 AM

## 2021-05-01 NOTE — Progress Notes (Signed)
NAME:  LYRIC HOAR, MRN:  [id number], DOB:  [birth year], LOS: 6 ADMISSION DATE:  04/25/2021, CONSULTATION DATE:04/25/2021 REFERRING MD: Dr. Quentin Cornwall, CHIEF COMPLAINT:  Shortness of Breath  History of Present Illness:  This is a 60 yo male with complex comorbid history including systolic CHF chronically, pulmonary hypertension, obstructive sleep apnea, obesity hypoventilation syndrome, chronic tracheostomy, and COPD.  He presented to [facility] ER on 07/27 via EMS from home with c/o shortness of breath.  He was discharged from [facility] to [place] on 07/7 following prolonged hospitalization due to acute on chronic hypoxic hypercapnic respiratory failure secondary to acinotobacter species pneumonia and AECOPD requiring tracheostomy.  He was discharged from [facility] to home on 07/26.  Per ED notes pts family reports they DO NOT know how to take care of his tracheostomy at home.  EMS reported upon their arrival at pts home pt diaphoretic with O2 sats 85% on RA and diaphoretic, therefore he was placed on 10L of O2 and received 125 mg solumedrol.  He remained alert and oriented.   ED Course Upon arrival to the ER pt noted to have increased work of breathing despite 10L O2 via trach collar.  VBG revealed pH 7.27/CO2 97/acid-base excess 12.5/bicarb 44.5.  He was therefore placed on mechanical ventilation via chronic tracheostomy.  CXR revealed chronic lung changes.  He received azithromycin, cefepime, vancomycin, and duoneb x1.  PCCM team contacted for ICU admission.    05/01/21- patient is improved, he is being optimized medically.  Plan for Education session for NIV tommor at 1130 with overnight trial utilizing home device   Pertinent  Medical History  COPD OSA Morbid Obesity Hypertension Hyperlipidemia GERD Depression T8 Vertebral Fracture Subarachnoid Hemorrhage  Chronic Tracheostomy  Significant Hospital Events: Including procedures, antibiotic start and stop dates in addition to other pertinent events    Pt admitted to ICU on mechanical ventilation via chronic tracheostomy  04/30/21- - met with wife and patient reviewed NIV needs, set up education session for patient and family to confirm no issues using bipap at home.    Interim History / Subjective:    Plan to wean to PS mode Needs vent to stay alive now Resp failure on vent Patient is awake    Objective   Blood pressure (!) 132/98, pulse (!) 110, temperature 98.4 F (36.9 C), temperature source Oral, resp. rate (!) 21, height '5\' 10"'  (1.778 m), weight (!) 151.4 kg, SpO2 100 %.    Vent Mode: PSV FiO2 (%):  [28 %] 28 % PEEP:  [5 cmH20] 5 cmH20 Pressure Support:  [10 cmH20] 10 cmH20   Intake/Output Summary (Last 24 hours) at 05/01/2021 0955 Last data filed at 05/01/2021 0500 Gross per 24 hour  Intake 900 ml  Output 1400 ml  Net -500 ml    Filed Weights   04/26/21 0500 04/30/21 0452 05/01/21 0500  Weight: (!) 145.8 kg (!) 151.5 kg (!) 151.4 kg     Review of Systems: +SOB Alert and awake Other:  All other systems negative  PHYSICAL EXAMINATION:  GENERAL: ill appearing, +resp distress on vent HEAD: Normocephalic, atraumatic.  EYES: Pupils equal, round, reactive to light.  No scleral icterus.  MOUTH: Moist mucosal membrane. NECK: Supple. S/p trach PULMONARY: +rhonchi CARDIOVASCULAR: S1 and S2. Regular rate and rhythm. No murmurs, rubs, or gallops.  GASTROINTESTINAL: Soft, nontender, -distended. Positive bowel sounds.  MUSCULOSKELETAL: No swelling, clubbing, or edema.  NEUROLOGIC: alert and awake SKIN:intact,warm,dry      Assessment & Plan:  Severe hypercapnic resp failure  secondary to AECOPD amd Obesity Hypoventilation Syndrome Mechanical Ventilation via Chronic Tracheostomy  Hx: Pulmonary HTN, Morbid Obesity Pickwikian syndrome Wean to PS mode   SEVERE COPD EXACERBATION -continue IV steroids as prescribed -continue NEB THERAPY as prescribed -morphine as needed -wean fio2 as needed and  tolerated  ELECTROLYTES -follow labs as needed -replace as needed -pharmacy consultation and following      Best Practice (right click and "Reselect all SmartList Selections" daily)   Diet/type: NPO; if pt remains mechanical ventilated via tracheostomy in the next 24hrs will place order for NG tube for TF's  DVT prophylaxis: LMWH GI prophylaxis: H2B Lines: N/A Foley:  N/A Code Status:  DNR Last date of multidisciplinary goals of care discussion [04/25/2021]  Labs   CBC: Recent Labs  Lab 04/26/21 0617 04/28/21 0535 04/29/21 0502 04/30/21 0529 05/01/21 0428  WBC 10.8* 7.9 8.8 9.5 10.5  NEUTROABS  --  5.6  --   --   --   HGB 12.2* 12.6* 12.1* 12.7* 12.4*  HCT 36.5* 39.7 37.8* 40.5 39.3  MCV 98.9 101.8* 101.3* 101.8* 101.8*  PLT 189 183 175 170 170     Basic Metabolic Panel: Recent Labs  Lab 04/25/21 0841 04/26/21 0617 04/28/21 0535 04/29/21 0502 04/30/21 0529 05/01/21 0428  NA 141 141 138 138 138 140  K 4.5 4.1 4.2 4.2 4.3 4.5  CL 95* 94* 94* 97* 96* 98  CO2 35* 34* 35* 37* 33* 35*  GLUCOSE 172* 127* 123* 109* 131* 147*  BUN '14 20 16 16 16 18  ' CREATININE 0.78 0.85 0.80 0.70 0.71 0.81  CALCIUM 9.2 9.3 8.9 8.7* 8.8* 8.9  MG 2.1 1.8 2.1  --  2.1  --   PHOS 4.5 4.0 4.1  --   --   --     GFR: Estimated Creatinine Clearance: 141.4 mL/min (by C-G formula based on SCr of 0.81 mg/dL). Recent Labs  Lab 04/25/21 0841 04/25/21 0845 04/26/21 0617 04/28/21 0535 04/29/21 0502 04/30/21 0529 05/01/21 0428  PROCALCITON <0.10  --   --   --   --   --   --   WBC 13.4*  --    < > 7.9 8.8 9.5 10.5  LATICACIDVEN  --  1.8  --   --   --   --   --    < > = values in this interval not displayed.     Liver Function Tests: Recent Labs  Lab 04/25/21 0841  AST 35  ALT 26  ALKPHOS 63  BILITOT 0.9  PROT 7.4  ALBUMIN 3.4*    ABG    Component Value Date/Time   PHART 7.55 (H) 04/25/2021 1652   PCO2ART 44 04/25/2021 1652   PO2ART 83 04/25/2021 1652   HCO3 38.5  (H) 04/25/2021 1652   O2SAT 97.4 04/25/2021 1652        DVT/GI PRX  assessed I Assessed the need for Labs I Assessed the need for Foley I Assessed the need for Central Venous Line Family Discussion when available I Assessed the need for Mobilization I made an Assessment of medications to be adjusted accordingly Safety Risk assessment completed  CASE DISCUSSED IN MULTIDISCIPLINARY ROUNDS WITH ICU TEAM     Critical Care Time devoted to patient care services described in this note is 34 minutes.  Critical care was necessary to treat /prevent imminent and life-threatening deterioration.   Ottie Glazier, M.D.  Pulmonary & [place]

## 2021-05-01 NOTE — Progress Notes (Signed)
Better day. Used urinal all day. Up in chair from 0900 until 1400. Napped some in the afternoon. Spoke to [facility]- and ask him to bring Trilogy ventilator in today to allow patient to use this HS. DR. Aleskerov voiced he felt the patient needed to use home ventilator in safe environment  to help ensure success at home. Also to trouble shot any unforseen problems before discharge. Zack agreed. No Trilogy ventilator had arrived by 1830.

## 2021-05-02 ENCOUNTER — Inpatient Hospital Stay: Payer: PPO

## 2021-05-02 DIAGNOSIS — G8929 Other chronic pain: Secondary | ICD-10-CM

## 2021-05-02 DIAGNOSIS — I10 Essential (primary) hypertension: Secondary | ICD-10-CM

## 2021-05-02 DIAGNOSIS — R000 Tachycardia, unspecified: Secondary | ICD-10-CM

## 2021-05-02 LAB — CBC
HCT: 41.5 % (ref 39.0–52.0)
Hemoglobin: 13.1 g/dL (ref 13.0–17.0)
MCH: 32.2 pg (ref 26.0–34.0)
MCHC: 31.6 g/dL (ref 30.0–36.0)
MCV: 102 fL — ABNORMAL HIGH (ref 80.0–100.0)
Platelets: 182 10*3/uL (ref 150–400)
RBC: 4.07 MIL/uL — ABNORMAL LOW (ref 4.22–5.81)
RDW: 14 % (ref 11.5–15.5)
WBC: 10 10*3/uL (ref 4.0–10.5)
nRBC: 0 % (ref 0.0–0.2)

## 2021-05-02 LAB — BASIC METABOLIC PANEL
Anion gap: 7 (ref 5–15)
BUN: 19 mg/dL (ref 8–23)
CO2: 37 mmol/L — ABNORMAL HIGH (ref 22–32)
Calcium: 9.3 mg/dL (ref 8.9–10.3)
Chloride: 95 mmol/L — ABNORMAL LOW (ref 98–111)
Creatinine, Ser: 0.68 mg/dL (ref 0.61–1.24)
GFR, Estimated: >60 mL/min (ref >60)
Glucose, Bld: 132 mg/dL — ABNORMAL HIGH (ref 70–99)
Potassium: 4.4 mmol/L (ref 3.5–5.1)
Sodium: 139 mmol/L (ref 135–145)

## 2021-05-02 LAB — GLUCOSE, CAPILLARY
Glucose-Capillary: 173 mg/dL — ABNORMAL HIGH (ref 70–99)
Glucose-Capillary: 177 mg/dL — ABNORMAL HIGH (ref 70–99)
Glucose-Capillary: 138 mg/dL — ABNORMAL HIGH (ref 70–99)
Glucose-Capillary: 177 mg/dL — ABNORMAL HIGH (ref 70–99)
Glucose-Capillary: 198 mg/dL — ABNORMAL HIGH (ref 70–99)

## 2021-05-02 LAB — PROCALCITONIN: Procalcitonin: <0.1 ng/mL

## 2021-05-02 MED ORDER — METOPROLOL TARTRATE 50 MG PO TABS
50.0000 mg | ORAL_TABLET | Freq: Two times a day (BID) | ORAL | Status: DC
  Administered 2021-05-02 – 2021-05-04 (×4): 50 mg via ORAL
  Filled 2021-05-02 (×4): qty 1

## 2021-05-02 MED ORDER — FUROSEMIDE 20 MG PO TABS
40.0000 mg | ORAL_TABLET | Freq: Two times a day (BID) | ORAL | Status: DC
Start: 2021-05-02 — End: 2021-05-04
  Administered 2021-05-03 – 2021-05-04 (×3): 40 mg via ORAL
  Filled 2021-05-02 (×3): qty 2

## 2021-05-02 MED ORDER — FUROSEMIDE 10 MG/ML IJ SOLN
40.0000 mg | Freq: Once | INTRAMUSCULAR | Status: AC
  Administered 2021-05-02: 40 mg via INTRAVENOUS
  Filled 2021-05-02: qty 4

## 2021-05-02 NOTE — Progress Notes (Signed)
Discussed  chest x-ray results with Dr. Manuella Ghazi.  Patient to receive one time dose of Lasix '40mg'$  IV and then start scheduled dose of lasix.

## 2021-05-02 NOTE — Progress Notes (Signed)
PROGRESS NOTE    HPI was taken from NP Graves: This is a 60 yo male with complex comorbid history including systolic CHF chronically, pulmonary hypertension, obstructive sleep apnea, obesity hypoventilation syndrome, chronic tracheostomy, and COPD.   He presented to [facility] ER on 07/27 via EMS from home with c/o shortness of breath.  He was discharged from [facility] to [place] on 07/7 following prolonged hospitalization due to acute on chronic hypoxic hypercapnic respiratory failure secondary to acinotobacter species pneumonia and AECOPD requiring tracheostomy.  He was discharged from [facility] to home on 07/26.  Per ED notes pts family reports they DO NOT know how to take care of his tracheostomy at home.  EMS reported upon their arrival at pts home pt diaphoretic with O2 sats 85% on RA and diaphoretic, therefore he was placed on 10L of O2 and received 125 mg solumedrol.  He remained alert and oriented.   ED Course Upon arrival to the ER pt noted to have increased work of breathing despite 10L O2 via trach collar.  VBG revealed pH 7.27/CO2 97/acid-base excess 12.5/bicarb 44.5.  He was therefore placed on mechanical ventilation via chronic tracheostomy.  CXR revealed chronic lung changes.  He received azithromycin, cefepime, vancomycin, and duoneb x1.  PCCM team contacted for ICU admission  As per Dr. [name]: Pt admitted to ICU on mechanical ventilation via chronic tracheostomy  04/30/21- - met with wife and patient reviewed NIV needs, set up education session for patient and family to confirm no issues using bipap at home. 8/2 -NIV/trilogy is being arranged 8/3: Given 40 mg of IV Lasix once and starting on 40 mg twice daily schedule.  NIV trial today  Hospital course from Dr. Jimmye Norman 7/30-05/01/21: Pt presented w/ shortness of breath likely secondary to COPD & obesity hypoventilation syndrome. Pt has hx of chronic tracheostomy. Pt is on trach collar and ventilator qhs. Pulmon is following and will set up  education session for pt patient and family for resp/pulmon needs for home. PT/OT initially recommended CIR but now PT recs home health. Home health has not been set up yet    Eddie Chapman  QIH:[id number] DOB: June 19, 1961 DOA: 04/25/2021 PCP: Adin Hector, MD   Assessment & Plan:   Active Problems:   Acute on chronic respiratory failure with hypercapnia (HCC)   Acute on chronic hypercapnic respiratory failure: secondary to COPD, obesity hypoventilation syndrome and underlying chronic diastolic heart failure - hx of chronic tracheostomy. Continue w/ trach collar during the day and mechanical ventilation at night. Continue on bronchodilators. Pulmon following and recs apprec   -Chest x-ray this morning shows fluid overload/vascular congestion.  We will repeat chest x-ray in the morning.  Checking procalcitonin as he does have low-grade fever/T-max of 100 -We will give 40 mg of IV Lasix once and start him on 40 mg twice daily schedule going forward.  Wife confirms him being on diuretic at home -With his tachypnea, tachycardia and low-grade fever he is at high risk for worsening clinical condition and will need close monitoring in ICU/stepdown today Net IO Since Admission: -8,581.65 mL [05/02/21 1749]   Essential HTN/sinus tachycardia: Starting Lasix 40 mg p.o. twice daily Will increase dose of metoprolol from 25 to 50 mg p.o. twice daily for better heart rate control  Hyperglycemia: HbA1c 5.6.  Continue sliding-scale insulin for now  Chronic pain: continue on home dose of oxycodone   Morbid obesity: BMI 46.1. Complicates overall care & prognosis    DVT prophylaxis: lovenox  Code  Status: DNR Family Communication:  Disposition Plan: PT/OT recs home health  Level of care: Stepdown  Status is: Inpatient  Remains inpatient appropriate because:Unsafe d/c plan and Inpatient level of care appropriate due to severity of illness, low-grade fever and tachypnea/tachycardia today  Dispo:  The patient is from: Home              Anticipated d/c is to: Home with home health potentially tomorrow if he can tolerate NIV/trilogy tonight              Patient currently is not medically stable to d/c.   Difficult to place patient No  Consultants:  PCCM  Procedures: None  Antimicrobials: None   Subjective: Sitting in the chair wants to make sure he can tolerate the new NIV/trilogy machine and hoping to get discharged after that.  Feels short of breath, tachycardic at rest  Objective: Vitals:   05/02/21 1500 05/02/21 1600 05/02/21 1700 05/02/21 1745  BP:    130/79  Pulse: 97 (!) 101 (!) 118 (!) 113  Resp: '20 14 20 ' (!) 31  Temp:    98.8 F (37.1 C)  TempSrc:    Oral  SpO2: 91% 98% 99% 100%  Weight:      Height:        Intake/Output Summary (Last 24 hours) at 05/02/2021 1746 Last data filed at 05/02/2021 1613 Gross per 24 hour  Intake 880 ml  Output 585 ml  Net 295 ml   Filed Weights   05/01/21 0500 05/01/21 1000 05/02/21 0500  Weight: (!) 151.4 kg (!) 151.3 kg (!) 149.9 kg    Examination:  General exam: Appears uncomfortable Respiratory system: decreased breath sounds b/l, rales at the bases Cardiovascular system: S1/S2+. No rubs or gallops, tachycardic Gastrointestinal system: Abd is soft, obese & hypoactive bowel sounds Central nervous system: alert and oriented. Moves all extremities  Psychiatry: Judgement and insight appears normal. Flat mood and affect     Data Reviewed: I have personally reviewed following labs and imaging studies  CBC: Recent Labs  Lab 04/28/21 0535 04/29/21 0502 04/30/21 0529 05/01/21 0428 05/02/21 0457  WBC 7.9 8.8 9.5 10.5 10.0  NEUTROABS 5.6  --   --   --   --   HGB 12.6* 12.1* 12.7* 12.4* 13.1  HCT 39.7 37.8* 40.5 39.3 41.5  MCV 101.8* 101.3* 101.8* 101.8* 102.0*  PLT 183 175 170 170 431   Basic Metabolic Panel: Recent Labs  Lab 04/26/21 0617 04/28/21 0535 04/29/21 0502 04/30/21 0529 05/01/21 0428 05/02/21 0457   NA 141 138 138 138 140 139  K 4.1 4.2 4.2 4.3 4.5 4.4  CL 94* 94* 97* 96* 98 95*  CO2 34* 35* 37* 33* 35* 37*  GLUCOSE 127* 123* 109* 131* 147* 132*  BUN '20 16 16 16 18 19  ' CREATININE 0.85 0.80 0.70 0.71 0.81 0.68  CALCIUM 9.3 8.9 8.7* 8.8* 8.9 9.3  MG 1.8 2.1  --  2.1  --   --   PHOS 4.0 4.1  --   --   --   --    GFR: Estimated Creatinine Clearance: 142.4 mL/min (by C-G formula based on SCr of 0.68 mg/dL). Liver Function Tests: No results for input(s): AST, ALT, ALKPHOS, BILITOT, PROT, ALBUMIN in the last 168 hours.  No results for input(s): LIPASE, AMYLASE in the last 168 hours. No results for input(s): AMMONIA in the last 168 hours. Coagulation Profile: No results for input(s): INR, PROTIME in the last 168 hours. Cardiac  Enzymes: No results for input(s): CKTOTAL, CKMB, CKMBINDEX, TROPONINI in the last 168 hours. BNP (last 3 results) No results for input(s): PROBNP in the last 8760 hours. HbA1C: No results for input(s): HGBA1C in the last 72 hours.  CBG: Recent Labs  Lab 05/01/21 1613 05/01/21 1938 05/02/21 0950 05/02/21 1239 05/02/21 1528  GLUCAP 123* 144* 173* 177* 138*   Lipid Profile: No results for input(s): CHOL, HDL, LDLCALC, TRIG, CHOLHDL, LDLDIRECT in the last 72 hours. Thyroid Function Tests: No results for input(s): TSH, T4TOTAL, FREET4, T3FREE, THYROIDAB in the last 72 hours. Anemia Panel: No results for input(s): VITAMINB12, FOLATE, FERRITIN, TIBC, IRON, RETICCTPCT in the last 72 hours. Sepsis Labs: No results for input(s): PROCALCITON, LATICACIDVEN in the last 168 hours.   Recent Results (from the past 240 hour(s))  Blood culture (routine x 2)     Status: None   Collection Time: 04/25/21  8:44 AM   Specimen: BLOOD  Result Value Ref Range Status   Specimen Description BLOOD RIGHT ANTECUBITAL  Final   Special Requests   Final    BOTTLES DRAWN AEROBIC AND ANAEROBIC Blood Culture results may not be optimal due to an inadequate volume of blood  received in culture bottles   Culture   Final    NO GROWTH 5 DAYS Performed at [facility], [address] [place], [place] [address]    Report Status 04/30/2021 FINAL  Final  Resp Panel by RT-PCR (Flu A&B, Covid) Nasopharyngeal Swab     Status: None   Collection Time: 04/25/21  8:48 AM   Specimen: Nasopharyngeal Swab; Nasopharyngeal(NP) swabs in vial transport medium  Result Value Ref Range Status   SARS Coronavirus 2 by RT PCR NEGATIVE NEGATIVE Final    Comment: (NOTE) SARS-CoV-2 target nucleic acids are NOT DETECTED.  The SARS-CoV-2 RNA is generally detectable in upper respiratory specimens during the acute phase of infection. The lowest concentration of SARS-CoV-2 viral copies this assay can detect is 138 copies/mL. A negative result does not preclude SARS-Cov-2 infection and should not be used as the sole basis for treatment or other patient management decisions. A negative result may occur with  improper specimen collection/handling, submission of specimen other than nasopharyngeal swab, presence of viral mutation(s) within the areas targeted by this assay, and inadequate number of viral copies(<138 copies/mL). A negative result must be combined with clinical observations, patient history, and epidemiological information. The expected result is Negative.  Fact Sheet for Patients:  EntrepreneurPulse.com.au  Fact Sheet for Healthcare Providers:  IncredibleEmployment.be  This test is no t yet approved or cleared by the [place] FDA and  has been authorized for detection and/or diagnosis of SARS-CoV-2 by FDA under an Emergency Use Authorization (EUA). This EUA will remain  in effect (meaning this test can be used) for the duration of the COVID-19 declaration under Section 564(b)(1) of the Act, 21 U.S.C.section 360bbb-3(b)(1), unless the authorization is terminated  or revoked sooner.       Influenza A by PCR NEGATIVE  NEGATIVE Final   Influenza B by PCR NEGATIVE NEGATIVE Final    Comment: (NOTE) The Xpert Xpress SARS-CoV-2/FLU/RSV plus assay is intended as an aid in the diagnosis of influenza from Nasopharyngeal swab specimens and should not be used as a sole basis for treatment. Nasal washings and aspirates are unacceptable for Xpert Xpress SARS-CoV-2/FLU/RSV testing.  Fact Sheet for Patients: EntrepreneurPulse.com.au  Fact Sheet for Healthcare Providers: IncredibleEmployment.be  This test is not yet approved or cleared by the [place]  FDA and has been authorized for detection and/or diagnosis of SARS-CoV-2 by FDA under an Emergency Use Authorization (EUA). This EUA will remain in effect (meaning this test can be used) for the duration of the COVID-19 declaration under Section 564(b)(1) of the Act, 21 U.S.C. section 360bbb-3(b)(1), unless the authorization is terminated or revoked.  Performed at [facility], [address] [place], [place] [address]   Blood culture (routine x 2)     Status: None   Collection Time: 04/25/21  8:49 AM   Specimen: BLOOD  Result Value Ref Range Status   Specimen Description BLOOD BLOOD RIGHT FOREARM  Final   Special Requests   Final    BOTTLES DRAWN AEROBIC AND ANAEROBIC Blood Culture results may not be optimal due to an inadequate volume of blood received in culture bottles   Culture   Final    NO GROWTH 5 DAYS Performed at [facility], [address] [place], [place] [address]    Report Status 04/30/2021 FINAL  Final  MRSA Next Gen by PCR, Nasal     Status: Abnormal   Collection Time: 04/25/21  3:41 PM   Specimen: Nasal Mucosa; Nasal Swab  Result Value Ref Range Status   MRSA by PCR Next Gen DETECTED (A) NOT DETECTED Final    Comment: RESULT CALLED TO, READ BACK BY AND VERIFIED WITH: MATHEW WRIGHT '@[id number]'  04/25/21 MJU (NOTE) The GeneXpert MRSA Assay (FDA approved for NASAL specimens only), is  one component of a comprehensive MRSA colonization surveillance program. It is not intended to diagnose MRSA infection nor to guide or monitor treatment for MRSA infections. Test performance is not FDA approved in patients less than 68 years old. Performed at [facility], [address], [place], [place] [address]          Radiology Studies: [facility] Chest [place] 1 View  Result Date: 05/02/2021 CLINICAL DATA:  Cough, shortness of breath EXAM: PORTABLE CHEST 1 VIEW COMPARISON:  04/25/2021 FINDINGS: Tracheostomy is unchanged. Cardiomegaly, vascular congestion. Bilateral lower lobe opacities, likely atelectasis. Scarring in the left lung base. No visible effusions or acute bony abnormality. IMPRESSION: Bibasilar opacities, likely atelectasis. Cardiomegaly, vascular congestion. Electronically Signed   By: Rolm Baptise M.D.   On: 05/02/2021 08:53        Scheduled Meds:  ARIPiprazole  5 mg Oral Daily   budesonide (PULMICORT) nebulizer solution  0.5 mg Nebulization BID   enoxaparin (LOVENOX) injection  0.5 mg/kg Subcutaneous Q24H   famotidine  20 mg Oral BID   furosemide  40 mg Oral BID   insulin aspart  0-15 Units Subcutaneous TID WC   insulin aspart  0-5 Units Subcutaneous QHS   ipratropium-albuterol  3 mL Nebulization TID   metoprolol tartrate  25 mg Oral BID   Continuous Infusions:   LOS: 7 days    Time spent: 25 mins     Max Sane, MD [facility] Pager [phone]  If 7PM-7AM, please contact night-coverage 05/02/2021, 5:46 PM

## 2021-05-02 NOTE — Progress Notes (Signed)
PT Cancellation Note  Patient Details Name: Eddie Chapman MRN: [id number] DOB: Jul 03, 1961   Cancelled Treatment:     PT attempt. Pt had just returned to bed, with RN staff, from sitting up in chair all morning. Requested to rest at this time. PT will return tomorrow morning and continue to follow per current POC.    Willette Pa 05/02/2021, 3:42 PM

## 2021-05-02 NOTE — Progress Notes (Signed)
NAME:  Eddie Chapman, MRN:  [id number], DOB:  17-Feb-1961, LOS: 7 ADMISSION DATE:  04/25/2021, CONSULTATION DATE:04/25/2021 REFERRING MD: Dr. Quentin Cornwall, CHIEF COMPLAINT:  Shortness of Breath  History of Present Illness:  This is a 60 yo male with complex comorbid history including systolic CHF chronically, pulmonary hypertension, obstructive sleep apnea, obesity hypoventilation syndrome, chronic tracheostomy, and COPD.  He presented to [facility] ER on 07/27 via EMS from home with c/o shortness of breath.  He was discharged from [facility] to [place] on 07/7 following prolonged hospitalization due to acute on chronic hypoxic hypercapnic respiratory failure secondary to acinotobacter species pneumonia and AECOPD requiring tracheostomy.  He was discharged from [facility] to home on 07/26.  Per ED notes pts family reports they DO NOT know how to take care of his tracheostomy at home.  EMS reported upon their arrival at pts home pt diaphoretic with O2 sats 85% on RA and diaphoretic, therefore he was placed on 10L of O2 and received 125 mg solumedrol.  He remained alert and oriented.   ED Course Upon arrival to the ER pt noted to have increased work of breathing despite 10L O2 via trach collar.  VBG revealed pH 7.27/CO2 97/acid-base excess 12.5/bicarb 44.5.  He was therefore placed on mechanical ventilation via chronic tracheostomy.  CXR revealed chronic lung changes.  He received azithromycin, cefepime, vancomycin, and duoneb x1.  PCCM team contacted for ICU admission.     Events:   Pt admitted to ICU on mechanical ventilation via chronic tracheostomy  04/30/21- - met with wife and patient reviewed NIV needs, set up education session for patient and family to confirm no issues using bipap at home.  05/01/21- patient is improved, he is being optimized medically.  Plan for Education session for NIV tommor at 1130 with overnight trial utilizing home device. 05/02/21- patient for NIV trial today. He is retaining fluid and will  be diruesed today. Plan for educational session with RT from [employer].    Pertinent  Medical History  COPD OSA Morbid Obesity Hypertension Hyperlipidemia GERD Depression T8 Vertebral Fracture Subarachnoid Hemorrhage  Chronic Tracheostomy     Objective   Blood pressure (!) 149/78, pulse 100, temperature 99.9 F (37.7 C), temperature source Oral, resp. rate (!) 28, height _0  (1.778 m), weight (!) 149.9 kg, SpO2 100 %.    Vent Mode: PSV FiO2 (%):  [28 %-36 %] 28 % PEEP:  [5 cmH20] 5 cmH20 Pressure Support:  [10 cmH20] 10 cmH20   Intake/Output Summary (Last 24 hours) at 05/02/2021 1039 Last data filed at 05/02/2021 1000 Gross per 24 hour  Intake 1040 ml  Output 125 ml  Net 915 ml    Filed Weights   05/01/21 0500 05/01/21 1000 05/02/21 0500  Weight: (!) 151.4 kg (!) 151.3 kg (!) 149.9 kg     Review of Systems: +SOB Alert and awake Other:  All other systems negative  PHYSICAL EXAMINATION:  GENERAL: Mild distress due to cough/trache HEAD: Normocephalic, atraumatic.  EYES: Pupils equal, round, reactive to light.  No scleral icterus.  MOUTH: Moist mucosal membrane. NECK: Supple. S/p trach PULMONARY: +rhonchi CARDIOVASCULAR: S1 and S2. Regular rate and rhythm. No murmurs, rubs, or gallops.  GASTROINTESTINAL: Soft, nontender, -distended. Positive bowel sounds.  MUSCULOSKELETAL: No swelling, clubbing, or edema.  NEUROLOGIC: alert and awake SKIN:intact,warm,dry      Assessment & Plan:  Severe hypercapnic resp failure  secondary to AECOPD amd Obesity Hypoventilation Syndrome Mechanical Ventilation via Chronic Tracheostomy  Hx: Pulmonary HTN, Morbid Obesity  Pickwikian syndrome Wean to PS mode   SEVERE COPD EXACERBATION -continue IV steroids as prescribed -continue NEB THERAPY as prescribed -morphine as needed -wean fio2 as needed and tolerated  ELECTROLYTES -follow labs as needed -replace as needed -pharmacy consultation and following      Best  Practice (right click and "Reselect all SmartList Selections" daily)   Diet/type: NPO; if pt remains mechanical ventilated via tracheostomy in the next 24hrs will place order for NG tube for TF's  DVT prophylaxis: LMWH GI prophylaxis: H2B Lines: N/A Foley:  N/A Code Status:  DNR Last date of multidisciplinary goals of care discussion [04/25/2021]  Labs   CBC: Recent Labs  Lab 04/28/21 0535 04/29/21 0502 04/30/21 0529 05/01/21 0428 05/02/21 0457  WBC 7.9 8.8 9.5 10.5 10.0  NEUTROABS 5.6  --   --   --   --   HGB 12.6* 12.1* 12.7* 12.4* 13.1  HCT 39.7 37.8* 40.5 39.3 41.5  MCV 101.8* 101.3* 101.8* 101.8* 102.0*  PLT 183 175 170 170 182     Basic Metabolic Panel: Recent Labs  Lab 04/26/21 0617 04/28/21 0535 04/29/21 0502 04/30/21 0529 05/01/21 0428 05/02/21 0457  NA 141 138 138 138 140 139  K 4.1 4.2 4.2 4.3 4.5 4.4  CL 94* 94* 97* 96* 98 95*  CO2 34* 35* 37* 33* 35* 37*  GLUCOSE 127* 123* 109* 131* 147* 132*  BUN _0 CREATININE 0.85 0.80 0.70 0.71 0.81 0.68  CALCIUM 9.3 8.9 8.7* 8.8* 8.9 9.3  MG 1.8 2.1  --  2.1  --   --   PHOS 4.0 4.1  --   --   --   --     GFR: Estimated Creatinine Clearance: 142.4 mL/min (by C-G formula based on SCr of 0.68 mg/dL). Recent Labs  Lab 04/29/21 0502 04/30/21 0529 05/01/21 0428 05/02/21 0457  WBC 8.8 9.5 10.5 10.0     Liver Function Tests: No results for input(s): AST, ALT, ALKPHOS, BILITOT, PROT, ALBUMIN in the last 168 hours.  ABG    Component Value Date/Time   PHART 7.55 (H) 04/25/2021 1652   PCO2ART 44 04/25/2021 1652   PO2ART 83 04/25/2021 1652   HCO3 38.5 (H) 04/25/2021 1652   O2SAT 97.4 04/25/2021 1652        DVT/GI PRX  assessed I Assessed the need for Labs I Assessed the need for Foley I Assessed the need for Central Venous Line Family Discussion when available I Assessed the need for Mobilization I made an Assessment of medications to be adjusted accordingly Safety Risk assessment  completed  CASE DISCUSSED IN MULTIDISCIPLINARY ROUNDS WITH ICU TEAM     Critical Care Time devoted to patient care services described in this note is 34 minutes.  Critical care was necessary to treat /prevent imminent and life-threatening deterioration.   Ottie Glazier, M.D.  Pulmonary & [place]

## 2021-05-02 NOTE — Progress Notes (Signed)
Dr. Manuella Ghazi notified that patient with RR 22-24, labored, and bilateral rhonchi noted with ascultation.  Patient also with low grade fever of 99.9 oral. Dr. Manuella Ghazi placed order for chest x-ray

## 2021-05-02 NOTE — Progress Notes (Signed)
Occupational Therapy Treatment Patient Details Name: Eddie Chapman MRN: [id number] DOB: 10/21/1960 Today's Date: 05/02/2021    History of present illness 60 y/o M who presented to the ED on 04/25/21 with c/c of SOB. Pt was discharged from [facility] to [place] on 04/05/21 following prolonged hospitalization 2/2 acute on chronic hypoxic hypercapnic respiratory failure 2/2 acinotobacter species pneumonia & AECOPD requiring tracheostomy. Pt was d/c home from [place] on 04/24/21, apparently did not have the correct/appropriate DME for O2 management at home and returned to [facility] with respiratory failure. PMH: systolic CHF, pulmonary HTN, OSA, obesity hypoventilation syndrome, chronic trach, COPD, depression, HLD, T8 vertebral fx   OT comments  Pt seen for OT tx this date to f/u re: safety with ADLs/ADL mobility. Pt in bed, sitting up and agreeable to getting OOB. Pt able to perform sup to sit with use of bed rails with SUPV and demos G static sitting balance. Pt somewhat impulsive requiring cues for safe use of bari 2WW. Pt CTS with SUPV/CGA and OT engages pt in MIP x5 per side to help increase circulation. Pt with F standing balance, with use of RW for UE support. OT engages pt in SPS transfer to recliner with CGA. In chair, OT engages pt in piriformis stretch to improve tolerance for LB ADLs including dressing in sitting. Pt requires MIN A to doff the velcro portion of the SCDs in sitting (detached from machine/pump before mobilizing). Pt needing to use restroom, CTS with SUPV/CGA for 3 mins to stand and void into urinal (MIN A to hold in place), but then requests use of BSC for BM. Pt requires MOD A for peri care. Pt returned to chair with all needs met and in reach. Will continue to follow.    Follow Up Recommendations  Home health OT;Supervision/Assistance - 24 hour    Equipment Recommendations  Wheelchair (measurements OT)    Recommendations for Other Services      Precautions / Restrictions  Precautions Precautions: Fall Restrictions Weight Bearing Restrictions: No       Mobility Bed Mobility Overal bed mobility: Modified Independent Bed Mobility: Supine to Sit     Supine to sit: Supervision;HOB elevated     General bed mobility comments: increased time, cues for use of bed rails, HOB elevated, no physical assist for sup to sit    Transfers Overall transfer level: Needs assistance Equipment used: Rolling walker (2 wheeled) Transfers: Sit to/from Stand Sit to Stand: Min guard;Supervision Stand pivot transfers: Min guard       General transfer comment: cues for safe use of RW    Balance Overall balance assessment: Needs assistance Sitting-balance support: Feet supported;No upper extremity supported Sitting balance-Leahy Scale: Good     Standing balance support: Bilateral upper extremity supported;During functional activity Standing balance-Leahy Scale: Fair Standing balance comment: UE support                           ADL either performed or assessed with clinical judgement   ADL Overall ADL's : Needs assistance/impaired                     Lower Body Dressing: Minimal assistance;Sitting/lateral leans Lower Body Dressing Details (indicate cue type and reason): to doff SCDs Toilet Transfer: Min Statistician Details (indicate cue type and reason): cues for safety Toileting- Clothing Manipulation and Hygiene: Moderate assistance;Maximal assistance;Sit to/from stand Toileting - Clothing Manipulation Details (indicate cue type and reason): MOD A  for posterior peri care after small BM, in standing with bari RW for balance with cues for thorough completion.     Functional mobility during ADLs: Min guard;Rolling walker       Vision Patient Visual Report: No change from baseline     Perception     Praxis      Cognition Arousal/Alertness: Awake/alert Behavior During Therapy: WFL for tasks  assessed/performed;Impulsive Overall Cognitive Status: Within Functional Limits for tasks assessed                                 General Comments: pt able to follow all commands, somewhat impulsive, but ammenable to cueing. Pt oriented. Occasionaly tangential speech.        Exercises Other Exercises Other Exercises: OT engages pt in MIP x5 per side in standing with SUPV with bari RW. OT engages pt in piriformis stretch in sitting with MIN A to improve tolerance for LB dressing tasks. OT enagages pt in standing peri care wtih cues for hand placement to thoroughly complete, but pt limitd by body habitus and still requires MOD A.   Shoulder Instructions       General Comments      Pertinent Vitals/ Pain       Pain Assessment: Faces Faces Pain Scale: Hurts a little bit Pain Location: generalized low back pain Pain Descriptors / Indicators: Aching Pain Intervention(s): Monitored during session;Repositioned  Home Living                                          Prior Functioning/Environment              Frequency  Min 2X/week        Progress Toward Goals  OT Goals(current goals can now be found in the care plan section)  Progress towards OT goals: Progressing toward goals  Acute Rehab OT Goals Patient Stated Goal: to go home OT Goal Formulation: With patient Time For Goal Achievement: 05/12/21 Potential to Achieve Goals: Good  Plan Discharge plan needs to be updated;Frequency needs to be updated    Co-evaluation                 AM-PAC OT "6 Clicks" Daily Activity     Outcome Measure   Help from another person eating meals?: None Help from another person taking care of personal grooming?: A Little Help from another person toileting, which includes using toliet, bedpan, or urinal?: A Lot Help from another person bathing (including washing, rinsing, drying)?: A Lot Help from another person to put on and taking off regular  upper body clothing?: A Little Help from another person to put on and taking off regular lower body clothing?: A Lot 6 Click Score: 16    End of Session Equipment Utilized During Treatment: Gait belt;Rolling walker;Oxygen  OT Visit Diagnosis: Other abnormalities of gait and mobility (R26.89);Muscle weakness (generalized) (M62.81);Other symptoms and signs involving cognitive function   Activity Tolerance Patient tolerated treatment well   Patient Left in chair;with call bell/phone within reach;with chair alarm set   Nurse Communication Mobility status        Time: 0857-0929 OT Time Calculation (min): 32 min  Charges: OT General Charges $OT Visit: 1 Visit OT Treatments $Self Care/Home Management : 8-22 mins $Therapeutic Activity: 8-22 mins   , MS, OTR/L   ascom [phone] 05/02/21, 10:55 AM

## 2021-05-02 NOTE — Progress Notes (Signed)
Speech Language Pathology Treatment: Dysphagia;Passy Muir Speaking valve  Patient Details Name: Eddie Chapman MRN: [id number] DOB: December 05, 1960 Today's Date: 05/02/2021 Time: 0915-1005 SLP Time Calculation (min) (ACUTE ONLY): 50 min  Assessment / Plan / Recommendation Clinical Impression  Pt seen for ongoing assessment of swallowing w/ education on general aspiration precautions w/ oral intake and his diet consistency; and also toleration of PMV wear/use. Pt is wearing his PMV during the day and now on TC O2 support. Pt remains on Nocturnal vent support w/ a Cuffed trach, baseline. Pt wears the PMV most of the day now for verbal communication and for po intake. NSG reported no noted coughing during oral intake at meals, Pills Whole in puree tolerated well. Diet is a dysphagia level 3 (mech soft) diet w/ thin liquids for ease of cut foods and safer oral intake at this time.  Pt was alert, talkative during session; mild Cognitive impairment, suspect related to Baseline issues s/p TBI in 2016(per GF, [name]). He requires verbal cues intermittently to follow through w/ general instructions/precautions; min decreased insight of his medical situation and GOC noted during his conversations. CXR: Bibasilar opacities, likely atelectasis. Cardiomegaly, vascular congestion.  Pt was wearing the PMV at baseline upon entering room. Pt wears it "most of the day" now. Pt's respiratory effort appeared grossly wnl w/ no increased exertion or WOB w/ Talking and Moving to sit more upright in chair. O2 sats 97-99%. Cuff deflated at baseline w/ frequent use of PMV during the day. No increased effort noted in respirations during the expiratory phase; no overt use of accessary muscles or distress was noted in his breathing pattern from his Baseline.   Pt appears to adequately tolerate PMV placement and wear w/out overt, gross respiratory discomfort or distress. He is able to use it for effective verbal communication w/ others and  to make wants/needs known.  Practiced placement and removal of the PMV -- hands-on practice. Pt benefits from someone assisting him. Racheal Patches, was educated on this last admit; NSG present to assist also. Reminded pt that there MUST have Cuff deflation for PMV wear while a Cuffed trach, checking and removing the air from the balloon b/f placing, placing/removing the PMV, and care of the PMV. Discussed that it MUST be worn for all eating/drinking; and can be work w/ Therapies(OT, PT). Must NOT be worn when sleeping. Encouraged Rest Breaks at times during the day. Pt was able to verbalize precautions including removing PMV to sleep. Precautions posted at bedside and in chart. Pt remains w/ a Cuffed trach d/t nocturnal vent need. Sticker placed on Cuff line, and in room. NSG made aware. MD updated.   Pt seen for ongoing assessment of swallowing; education on general aspiration precautions w/ oral intake and his diet consistency. He is able to wear the PMV comfortably now for verbal communication and for po intake. NSG reported no noted coughing during oral intake at meals, Pills in puree. Pt is on a Dysphagia level 3 (mech soft) w/ thin liquids.   Discussed pt's diet consistency, general aspiration precautions, and wear of the PMV during all oral intake w/ both pt and GF this session. Explained general aspiration precautions and their importance including small, single sips and small bites of foods -- not overfilling the mouth or eating too fast. Pt agreed verbally and was able to recall 5/5 precautions verbally w/ min verbal cues. Especially highlighted the importance of sitting upright and Forward for all oral intake, No Talking w/ food and drink  in mouth, and wearing PMV for ALL oral intake. Suspect inconsistency in recall, follow through w/ precautions, and reduced insight could be min impacts of his baseline Cognitive decline (old TBI) w/ impact of new illness and extended hospitalization.  Pt consumed po  trials at breakfast meal of mech soft foods w/ thin liquids w/ no overt clinical s/s of aspiration noted; no overt coughing, no decline in pulmonary status, no wet vocal quality. Oral phase WFL for bolus maanagement and oral clearing. Pt's Respiratory status remained grossly calm w/ No increased exertion or WOB/SOB noted during oral phase mastication of softened foods.   Discussed pt's presentation w/ NSG; noted Baseline of GERD/REFLUX w/ phlegm and expectoration episodes at home and need to follow REFLUX precautions currently. Pt is on a PPI currently. Recommended NOT making any changes in the diet consistency currently d/t adequate (Good) toleration of diet -- recommend continuing a Dysphagia level 3 diet (mech soft foods) w/ gravies added to moisten foods; Thin liquids. Recommend aspiration precautions; Pills in Puree; Reflux precautions; tray setup and positioning; MUST wear the PMV for ALL oral intake.      HPI HPI: This is a 60 yo male with complex comorbid history including systolic CHF chronically, pulmonary hypertension, obstructive sleep apnea, obesity hypoventilation syndrome, chronic tracheostomy, and COPD.     He presented to [facility] ER on 07/27 via EMS from home with c/o shortness of breath.  He was discharged from [facility] to [place] on 07/7 following prolonged hospitalization due to acute on chronic hypoxic hypercapnic respiratory failure secondary to acinotobacter species pneumonia and AECOPD requiring tracheostomy.  He was discharged from [facility] to home on 07/26.  Per ED notes pts family reports they DO NOT know how to take care of his tracheostomy at home.  EMS reported upon their arrival at pts home pt diaphoretic with O2 sats 85% on RA and diaphoretic, therefore he was placed on 10L of O2 and received 125 mg solumedrol.  He remained alert and oriented.     ED Course  Upon arrival to the ER pt noted to have increased work of breathing despite 10L O2 via trach collar.  VBG revealed pH 7.27/CO2  97/acid-base excess 12.5/bicarb 44.5.  He was therefore placed on mechanical ventilation via chronic tracheostomy.  CXR revealed chronic lung changes.  He received azithromycin, cefepime, vancomycin, and duoneb x1.  PCCM team contacted for ICU admission.      SLP Plan  All goals met       Recommendations  Diet recommendations: Dysphagia 3 (mechanical soft);Thin liquid Liquids provided via: Cup;Straw (monitor) Medication Administration: Whole meds with puree (as needed for ease, safety of swallowing) Supervision: Patient able to self feed (setup support) Compensations: Minimize environmental distractions;Slow rate;Small sips/bites;Lingual sweep for clearance of pocketing;Multiple dry swallows after each bite/sip;Follow solids with liquid Postural Changes and/or Swallow Maneuvers: Out of bed for meals;Seated upright 90 degrees;Upright 30-60 min after meal (REFLUX precautions)      Patient may use Passy-Muir Speech Valve: During all therapies with supervision;During all waking hours (remove during sleep);During PO intake/meals;Caregiver trained to provide supervision PMSV Supervision: Intermittent (as needed) MD: Please consider changing trach tube to :  (n/a)         General recommendations:  (PT/OT following) Oral Care Recommendations: Oral care BID;Oral care before and after PO;Patient independent with oral care Follow up Recommendations: None (for ST swallowing/PMV use and wear) SLP Visit Diagnosis: Dysphagia, unspecified (R13.10) (Tracheostomy - chronic) Plan: All goals met  [place], MS, CCC-SLP Speech Language Pathologist Rehab Services [phone] [facility] 05/02/2021, 3:08 PM

## 2021-05-03 ENCOUNTER — Inpatient Hospital Stay: Payer: PPO

## 2021-05-03 DIAGNOSIS — E1169 Type 2 diabetes mellitus with other specified complication: Secondary | ICD-10-CM

## 2021-05-03 DIAGNOSIS — E785 Hyperlipidemia, unspecified: Secondary | ICD-10-CM

## 2021-05-03 DIAGNOSIS — I5033 Acute on chronic diastolic (congestive) heart failure: Secondary | ICD-10-CM

## 2021-05-03 DIAGNOSIS — G894 Chronic pain syndrome: Secondary | ICD-10-CM

## 2021-05-03 LAB — GLUCOSE, CAPILLARY
Glucose-Capillary: 145 mg/dL — ABNORMAL HIGH (ref 70–99)
Glucose-Capillary: 177 mg/dL — ABNORMAL HIGH (ref 70–99)
Glucose-Capillary: 135 mg/dL — ABNORMAL HIGH (ref 70–99)
Glucose-Capillary: 186 mg/dL — ABNORMAL HIGH (ref 70–99)

## 2021-05-03 LAB — PROCALCITONIN: Procalcitonin: <0.1 ng/mL

## 2021-05-03 NOTE — TOC Progression Note (Signed)
Transition of Care [facility]) - Progression Note    Patient Details  Name: Eddie Chapman MRN: [id number] Date of Birth: Oct 02, 1960  Transition of Care [facility]) CM/SW [place], RN Phone Number: 05/03/2021, 3:02 PM  Clinical Narrative:  [name] with patient and [name] at bedside, both agree with [facility] services however no preference. [facility], only [facility] able to provide PT/OT do not have nursing at this time. Discussed with Attending who consented with PT/OT only, patient to discharge tomorrow if he remains medically stable.    Expected Discharge Plan: [place] Barriers to Discharge: Continued Medical Work up  Expected Discharge Plan and Services Expected Discharge Plan: [place] In-house Referral: Clinical Social Work   Post Acute Care Choice: Durable Medical Equipment, Home Health Living arrangements for the past 2 months: [place]                   DME Agency: [employer] Date DME Agency Contacted: 04/27/21 Time DME Agency Contacted: [id number] Representative spoke with at DME Agency: Andree Coss [phone]             Social Determinants of Health ([place]) Interventions    Readmission Risk Interventions No flowsheet data found.

## 2021-05-03 NOTE — Progress Notes (Signed)
Occupational Therapy Treatment Patient Details Name: Eddie Chapman MRN: [id number] DOB: 02-07-1961 Today's Date: 05/03/2021    History of present illness 60 y/o M who presented to the ED on 04/25/21 with c/c of SOB. Pt was discharged from [facility] to [place] on 04/05/21 following prolonged hospitalization 2/2 acute on chronic hypoxic hypercapnic respiratory failure 2/2 acinotobacter species pneumonia & AECOPD requiring tracheostomy. Pt was d/c home from [place] on 04/24/21, apparently did not have the correct/appropriate DME for O2 management at home and returned to [facility] with respiratory failure. PMH: systolic CHF, pulmonary HTN, OSA, obesity hypoventilation syndrome, chronic trach, COPD, depression, HLD, T8 vertebral fx   OT comments  Pt seen for OT tx this date to f/u re: safety with ADLs/ADL mobility. OT engages pt in LB dressing task while seated in chair, noting that pt's hip flexibility is improving and pt can now don socks with MIN A. Seated in chair, OT engages pt in below-listed exercise in prep for fxl mobility to improve circulation. Pt able to CTS with SUPV with use of bari RW and cues for safety as well as line/lead mgt. PT presents at this time to initiate session with fxl mobility. Will continue to follow acutely. Continue to recommend HHOT f.u. Ed completed with pt's SO re: detaching/attaching portable trach collar for fxl mobility/fxl standing tasks. Limited reception perceived by this author.    Follow Up Recommendations  Home health OT;Supervision/Assistance - 24 hour    Equipment Recommendations  Wheelchair (measurements OT) (22" Width, 20" height)    Recommendations for Other Services      Precautions / Restrictions Precautions Precautions: Fall Precaution Comments: trach collar, PMV Restrictions Weight Bearing Restrictions: No       Mobility Bed Mobility               General bed mobility comments: in recliner pre/post session    Transfers Overall transfer level:  Needs assistance Equipment used: Rolling walker (2 wheeled) Transfers: Sit to/from Stand Sit to Stand: Supervision         General transfer comment: increased time, cues for safety, line/lead management    Balance Overall balance assessment: Needs assistance Sitting-balance support: Feet supported;No upper extremity supported Sitting balance-Leahy Scale: Good     Standing balance support: Bilateral upper extremity supported;During functional activity Standing balance-Leahy Scale: Fair Standing balance comment: UE support on RW                           ADL either performed or assessed with clinical judgement   ADL Overall ADL's : Needs assistance/impaired                     Lower Body Dressing: Minimal assistance;Sitting/lateral leans Lower Body Dressing Details (indicate cue type and reason): to practice donning socks while seated in recliner             Functional mobility during ADLs: Min guard;Rolling walker (with chair follow and cues for pacing)       Vision Patient Visual Report: No change from baseline     Perception     Praxis      Cognition Arousal/Alertness: Awake/alert Behavior During Therapy: WFL for tasks assessed/performed;Impulsive Overall Cognitive Status: Within Functional Limits for tasks assessed Area of Impairment: Safety/judgement;Attention;Problem solving;Following commands                 Orientation Level: Disoriented to;Situation Current Attention Level: Focused Memory: Decreased short-term memory;Decreased recall of  precautions Following Commands: Follows one step commands consistently Safety/Judgement: Decreased awareness of deficits;Decreased awareness of safety Awareness: Emergent Problem Solving: Requires verbal cues;Requires tactile cues General Comments: Pt is alert with significant other at bedside. Ed provided during session with pt and SO re: mobilizing including detaching/attaching O2/trach  collar to portable. Limited reception detected.        Exercises Other Exercises Other Exercises: OT ed with pt and spouse re: attaching/detaching pt's trach collar to/from portable. Other Exercises: OT engages pt in seated MIP for 3 sets x15 reps to improve fxl activity toleranc and circulation in prep for standing/fxl mobility. pt tolerates well.   Shoulder Instructions       General Comments Pt on 6L trach collar throughout session (5L on wall, but not an option on the tank). Pt sats >94% throughout fxl activity/amb, but pt's RR noted to reach 42 with fxl mobility and pt requires cues for pacing.    Pertinent Vitals/ Pain       Pain Assessment: No/denies pain  Home Living                                          Prior Functioning/Environment              Frequency  Min 2X/week        Progress Toward Goals  OT Goals(current goals can now be found in the care plan section)  Progress towards OT goals: Progressing toward goals  Acute Rehab OT Goals Patient Stated Goal: to go home OT Goal Formulation: With patient Time For Goal Achievement: 05/12/21 Potential to Achieve Goals: Good  Plan Discharge plan needs to be updated;Frequency needs to be updated    Co-evaluation                 AM-PAC OT "6 Clicks" Daily Activity     Outcome Measure   Help from another person eating meals?: None Help from another person taking care of personal grooming?: None Help from another person toileting, which includes using toliet, bedpan, or urinal?: A Lot Help from another person bathing (including washing, rinsing, drying)?: A Lot Help from another person to put on and taking off regular upper body clothing?: A Little Help from another person to put on and taking off regular lower body clothing?: A Little 6 Click Score: 18    End of Session Equipment Utilized During Treatment: Gait belt;Rolling walker;Oxygen  OT Visit Diagnosis: Other abnormalities  of gait and mobility (R26.89);Muscle weakness (generalized) (M62.81);Other symptoms and signs involving cognitive function   Activity Tolerance Patient tolerated treatment well   Patient Left in chair;with call bell/phone within reach;with chair alarm set   Nurse Communication Mobility status        Time: 1006-1020 OT Time Calculation (min): 14 min  Charges: OT General Charges $OT Visit: 1 Visit OT Treatments $Therapeutic Exercise: 8-22 mins  Gerrianne Scale, [place], OTR/L ascom [phone] 05/03/21, 6:04 PM

## 2021-05-03 NOTE — Progress Notes (Signed)
NAME:  Eddie Chapman, MRN:  [id number], DOB:  Sep 09, 1961, LOS: 8 ADMISSION DATE:  04/25/2021, CONSULTATION DATE:04/25/2021 REFERRING MD: Dr. Quentin Cornwall, CHIEF COMPLAINT:  Shortness of Breath  History of Present Illness:  This is a 60 yo male with complex comorbid history including systolic CHF chronically, pulmonary hypertension, obstructive sleep apnea, obesity hypoventilation syndrome, chronic tracheostomy, and COPD.  He presented to [facility] ER on 07/27 via EMS from home with c/o shortness of breath.  He was discharged from [facility] to [place] on 07/7 following prolonged hospitalization due to acute on chronic hypoxic hypercapnic respiratory failure secondary to acinotobacter species pneumonia and AECOPD requiring tracheostomy.  He was discharged from [facility] to home on 07/26.  Per ED notes pts family reports they DO NOT know how to take care of his tracheostomy at home.  EMS reported upon their arrival at pts home pt diaphoretic with O2 sats 85% on RA and diaphoretic, therefore he was placed on 10L of O2 and received 125 mg solumedrol.  He remained alert and oriented.   ED Course Upon arrival to the ER pt noted to have increased work of breathing despite 10L O2 via trach collar.  VBG revealed pH 7.27/CO2 97/acid-base excess 12.5/bicarb 44.5.  He was therefore placed on mechanical ventilation via chronic tracheostomy.  CXR revealed chronic lung changes.  He received azithromycin, cefepime, vancomycin, and duoneb x1.  PCCM team contacted for ICU admission.     Events:   Pt admitted to ICU on mechanical ventilation via chronic tracheostomy  04/30/21- - met with wife and patient reviewed NIV needs, set up education session for patient and family to confirm no issues using bipap at home.  05/01/21- patient is improved, he is being optimized medically.  Plan for Education session for NIV tommor at 1130 with overnight trial utilizing home device. 05/02/21- patient for NIV trial today. He is retaining fluid and will  be diruesed today. Plan for educational session with RT from [employer].   05/03/21- patient is better clinically, I met with his GF and they both wish to try one more night with trache and ventilator to get familiar with settings and suctioining prior to dc in am. They are appreciative of care.  CXR today is improved with residual atelectasis due to shalow breathing.   Pertinent  Medical History  COPD OSA Morbid Obesity Hypertension Hyperlipidemia GERD Depression T8 Vertebral Fracture Subarachnoid Hemorrhage  Chronic Tracheostomy     Objective   Blood pressure (!) 139/92, pulse (!) 103, temperature 98.3 F (36.8 C), temperature source Oral, resp. rate (!) 30, height _0  (1.778 m), weight (!) 148 kg, SpO2 99 %.    Vent Mode: PSV FiO2 (%):  [28 %-32 %] 28 % PEEP:  [5 cmH20] 5 cmH20 Pressure Support:  [10 cmH20] 10 cmH20   Intake/Output Summary (Last 24 hours) at 05/03/2021 0911 Last data filed at 05/03/2021 0900 Gross per 24 hour  Intake 1080 ml  Output 1980 ml  Net -900 ml    Filed Weights   05/01/21 1000 05/02/21 0500 05/03/21 0500  Weight: (!) 151.3 kg (!) 149.9 kg (!) 148 kg     Review of Systems: +SOB Alert and awake Other:  All other systems negative  PHYSICAL EXAMINATION:  GENERAL: Mild distress due to cough/trache HEAD: Normocephalic, atraumatic.  EYES: Pupils equal, round, reactive to light.  No scleral icterus.  MOUTH: Moist mucosal membrane. NECK: Supple. S/p trach PULMONARY: +rhonchi CARDIOVASCULAR: S1 and S2. Regular rate and rhythm. No murmurs, rubs, or  gallops.  GASTROINTESTINAL: Soft, nontender, -distended. Positive bowel sounds.  MUSCULOSKELETAL: No swelling, clubbing, or edema.  NEUROLOGIC: alert and awake SKIN:intact,warm,dry      Assessment & Plan:  Severe hypercapnic resp failure  secondary to AECOPD amd Obesity Hypoventilation Syndrome Mechanical Ventilation via Chronic Tracheostomy  Hx: Pulmonary HTN, Morbid Obesity [place]  syndrome Wean to PS mode   SEVERE COPD EXACERBATION -continue IV steroids as prescribed -continue NEB THERAPY as prescribed -morphine as needed -wean fio2 as needed and tolerated  ELECTROLYTES -follow labs as needed -replace as needed -pharmacy consultation and following      Best Practice (right click and "Reselect all SmartList Selections" daily)   Diet/type: NPO; if pt remains mechanical ventilated via tracheostomy in the next 24hrs will place order for NG tube for TF's  DVT prophylaxis: LMWH GI prophylaxis: H2B Lines: N/A Foley:  N/A Code Status:  DNR Last date of multidisciplinary goals of care discussion [04/25/2021]  Labs   CBC: Recent Labs  Lab 04/28/21 0535 04/29/21 0502 04/30/21 0529 05/01/21 0428 05/02/21 0457  WBC 7.9 8.8 9.5 10.5 10.0  NEUTROABS 5.6  --   --   --   --   HGB 12.6* 12.1* 12.7* 12.4* 13.1  HCT 39.7 37.8* 40.5 39.3 41.5  MCV 101.8* 101.3* 101.8* 101.8* 102.0*  PLT 183 175 170 170 182     Basic Metabolic Panel: Recent Labs  Lab 04/28/21 0535 04/29/21 0502 04/30/21 0529 05/01/21 0428 05/02/21 0457  NA 138 138 138 140 139  K 4.2 4.2 4.3 4.5 4.4  CL 94* 97* 96* 98 95*  CO2 35* 37* 33* 35* 37*  GLUCOSE 123* 109* 131* 147* 132*  BUN _0 CREATININE 0.80 0.70 0.71 0.81 0.68  CALCIUM 8.9 8.7* 8.8* 8.9 9.3  MG 2.1  --  2.1  --   --   PHOS 4.1  --   --   --   --     GFR: Estimated Creatinine Clearance: 141.3 mL/min (by C-G formula based on SCr of 0.68 mg/dL). Recent Labs  Lab 04/29/21 0502 04/30/21 0529 05/01/21 0428 05/02/21 0457 05/03/21 0510  PROCALCITON  --   --   --  <0.10 <0.10  WBC 8.8 9.5 10.5 10.0  --      Liver Function Tests: No results for input(s): AST, ALT, ALKPHOS, BILITOT, PROT, ALBUMIN in the last 168 hours.  ABG    Component Value Date/Time   PHART 7.55 (H) 04/25/2021 1652   PCO2ART 44 04/25/2021 1652   PO2ART 83 04/25/2021 1652   HCO3 38.5 (H) 04/25/2021 1652   O2SAT 97.4  04/25/2021 1652        DVT/GI PRX  assessed I Assessed the need for Labs I Assessed the need for Foley I Assessed the need for Central Venous Line Family Discussion when available I Assessed the need for Mobilization I made an Assessment of medications to be adjusted accordingly Safety Risk assessment completed  CASE DISCUSSED IN MULTIDISCIPLINARY ROUNDS WITH ICU TEAM     Critical Care Time devoted to patient care services described in this note is 34 minutes.  Critical care was necessary to treat /prevent imminent and life-threatening deterioration.   Ottie Glazier, M.D.  Pulmonary & [place]

## 2021-05-03 NOTE — Progress Notes (Signed)
Pt remains on home vent unit and is tolerating well.

## 2021-05-03 NOTE — Progress Notes (Addendum)
Better day. Walked in hall with PT. Up in chair most of the day. [name] in room all day. Reinforced trach care with suctioning and inner cannula change with repeat demonstration. Game plan -discharge tomorrow.

## 2021-05-03 NOTE — Progress Notes (Signed)
Physical Therapy Treatment Patient Details Name: Eddie Chapman MRN: [id number] DOB: [birth year] Today's Date: 05/03/2021    History of Present Illness 60 y/o M who presented to the ED on 04/25/21 with c/c of SOB. Pt was discharged from [facility] to [place] on 04/05/21 following prolonged hospitalization 2/2 acute on chronic hypoxic hypercapnic respiratory failure 2/2 acinotobacter species pneumonia & AECOPD requiring tracheostomy. Pt was d/c home from [place] on 04/24/21, apparently did not have the correct/appropriate DME for O2 management at home and returned to [facility] with respiratory failure. PMH: systolic CHF, pulmonary HTN, OSA, obesity hypoventilation syndrome, chronic trach, COPD, depression, HLD, T8 vertebral fx    PT Comments    Pt was sitting in recliner on trach collar 5 L just finishing OT upon arriving. He agrees to session and is cooperative throughout.Supportive spouse at bedside/ present during session. Both pt and spouse are a little anxious about upcoming DC to home. Will greatly benefit from more education on breathing/O2 situation at home. " One of the staff members will be at the house with [place] all the time right?" Author discussed that there will not be 24/7 assist at home. Address the focus on getting pt/ spouse (caregiver) confident with equipment prior to [place] home. From a PT standpoint, pt easily able to stand and ambulate with RW without LOB however does fatigue quickly with several standing rest breaks. RR elevated into low 40s but pt recovers quickly.  Recommend pt ambulate more frequently but less distance.    Follow Up Recommendations  Home health PT;Supervision/Assistance - 24 hour     Equipment Recommendations  Bariatric RW + BSC       Precautions / Restrictions Precautions Precautions: Fall Precaution Comments: trach collar, PMV Restrictions Weight Bearing Restrictions: No    Mobility  Bed Mobility      General bed mobility comments: in recliner pre/post session     Transfers Overall transfer level: Needs assistance Equipment used: Rolling walker (2 wheeled);None (trach collar throughout session) Transfers: Sit to/from Stand Sit to Stand: Supervision         General transfer comment: pt was able to stand without physical assistance. cued only for improve safety/technique  Ambulation/Gait Ambulation/Gait assistance: Min guard Gait Distance (Feet): 300 Feet Assistive device: Rolling walker (2 wheeled) Gait Pattern/deviations: Decreased step length - right;Decreased step length - left;Decreased stride length;Decreased dorsiflexion - right;Decreased dorsiflexion - left Gait velocity: pt was able to ambulate 300 ft with RW without LOB however several standing rest breaks. RR elevated however pt assymptomatic   General Gait Details: Chair follow for safety. Educated on need to perform shorter frequent walks.     Balance Overall balance assessment: Needs assistance Sitting-balance support: Feet supported;No upper extremity supported Sitting balance-Leahy Scale: Good     Standing balance support: Bilateral upper extremity supported;During functional activity Standing balance-Leahy Scale: Fair Standing balance comment: UE support on RW        Cognition Arousal/Alertness: Awake/alert Behavior During Therapy: Anxious;WFL for tasks assessed/performed Overall Cognitive Status: Within Functional Limits for tasks assessed Area of Impairment: Safety/judgement;Attention;Problem solving;Following commands        General Comments: Pt is alert with spouse at bedside however author questions how much of education throughout session actually fully/truely comprehended             Pertinent Vitals/Pain Pain Assessment: No/denies pain     PT Goals (current goals can now be found in the care plan section) Acute Rehab PT Goals Patient Stated Goal: to go home Progress  towards PT goals: Progressing toward goals    Frequency    Min  2X/week      PT Plan Current plan remains appropriate    Co-evaluation     PT goals addressed during session: Mobility/safety with mobility;Balance;Proper use of DME;Strengthening/ROM        AM-PAC PT "6 Clicks" Mobility   Outcome Measure  Help needed turning from your back to your side while in a flat bed without using bedrails?: A Little Help needed moving from lying on your back to sitting on the side of a flat bed without using bedrails?: A Little Help needed moving to and from a bed to a chair (including a wheelchair)?: A Little Help needed standing up from a chair using your arms (e.g., wheelchair or bedside chair)?: A Little Help needed to walk in hospital room?: A Little Help needed climbing 3-5 steps with a railing? : A Little 6 Click Score: 18    End of Session Equipment Utilized During Treatment: Oxygen (trach collar) Activity Tolerance: Patient limited by fatigue Patient left: in chair;with call bell/phone within reach Nurse Communication: Mobility status PT Visit Diagnosis: Muscle weakness (generalized) (M62.81);Difficulty in walking, not elsewhere classified (R26.2);Unsteadiness on feet (R26.81)     Time: [id number] PT Time Calculation (min) (ACUTE ONLY): 17 min  Charges:  $Gait Training: 8-22 mins                     Julaine Fusi PTA 05/03/21, 1:40 PM

## 2021-05-03 NOTE — Progress Notes (Signed)
PROGRESS NOTE    HPI was taken from NP Graves: This is a 60 yo male with complex comorbid history including systolic CHF chronically, pulmonary hypertension, obstructive sleep apnea, obesity hypoventilation syndrome, chronic tracheostomy, and COPD.   He presented to [facility] ER on 07/27 via EMS from home with c/o shortness of breath.  He was discharged from [facility] to [place] on 07/7 following prolonged hospitalization due to acute on chronic hypoxic hypercapnic respiratory failure secondary to acinotobacter species pneumonia and AECOPD requiring tracheostomy.  He was discharged from [facility] to home on 07/26.  Per ED notes pts family reports they DO NOT know how to take care of his tracheostomy at home.  EMS reported upon their arrival at pts home pt diaphoretic with O2 sats 85% on RA and diaphoretic, therefore he was placed on 10L of O2 and received 125 mg solumedrol.  He remained alert and oriented.   ED Course Upon arrival to the ER pt noted to have increased work of breathing despite 10L O2 via trach collar.  VBG revealed pH 7.27/CO2 97/acid-base excess 12.5/bicarb 44.5.  He was therefore placed on mechanical ventilation via chronic tracheostomy.  CXR revealed chronic lung changes.  He received azithromycin, cefepime, vancomycin, and duoneb x1.  PCCM team contacted for ICU admission  As per Dr. [name]: Pt admitted to ICU on mechanical ventilation via chronic tracheostomy  04/30/21- - met with wife and patient reviewed NIV needs, set up education session for patient and family to confirm no issues using bipap at home. 8/2 -NIV/trilogy is being arranged 8/3: Given 40 mg of IV Lasix once and starting on 40 mg twice daily schedule.  NIV trial today 8/4: Patient tolerated NIV/trilogy trial yesterday.  Getting further diuresed today as still has fluid overload in the hope for discharge tomorrow  Hospital course from Dr. Jimmye Norman 7/30-05/01/21: Pt presented w/ shortness of breath likely secondary to COPD &  obesity hypoventilation syndrome. Pt has hx of chronic tracheostomy. Pt is on trach collar and ventilator qhs. Pulmon is following and will set up education session for pt patient and family for resp/pulmon needs for home. PT/OT initially recommended CIR but now PT recs home health. Home health has not been set up yet    Eddie Chapman  DJS:[id number] DOB: 25-Mar-1961 DOA: 04/25/2021 PCP: Adin Hector, MD   Assessment & Plan:   Active Problems:   Acute on chronic respiratory failure with hypercapnia (HCC)   Acute on chronic hypercapnic respiratory failure: secondary to COPD, obesity hypoventilation syndrome and underlying chronic diastolic heart failure - hx of chronic tracheostomy. Continue w/ trach collar during the day and mechanical ventilation at night. Continue on bronchodilators. Pulmon following and recs apprec   -Chest x-ray this morning still shows vascular congestion and fluid overload.  Normal procalcitonin makes it unlikely to have bacterial infection/pneumonia -Continue Lasix oral 40 mg p.o. twice daily -Monitor strict I's and O's and continue diuresis at least 1 more day with repeat chest x-ray in the morning Net IO Since Admission: -9,501.65 mL [05/03/21 1708]   Essential HTN/sinus tachycardia: Continue Lasix 40 mg p.o. twice daily Continue metoprolol 50 mg p.o. twice daily for better heart rate control  Hyperglycemia: HbA1c 5.6.  Continue sliding-scale insulin for now  Chronic pain: continue on home dose of oxycodone   Morbid obesity: BMI 46.1. Complicates overall care & prognosis    DVT prophylaxis: lovenox  Code Status: DNR Family Communication: Updated wife on 8/4 at bedside Disposition Plan: PT/OT recs home health.  Patient and family in agreement  Level of care: Stepdown  Status is: Inpatient  Remains inpatient appropriate because:Unsafe d/c plan and Inpatient level of care appropriate due to severity of illness, low-grade fever and tachypnea/tachycardia  today  Dispo: The patient is from: Home              Anticipated d/c is to: Home with home health potentially tomorrow               Patient currently is not medically stable to d/c.   Difficult to place patient No  Consultants:  PCCM  Procedures: None  Antimicrobials: None   Subjective: Sitting in the chair -still some shortness of breath and hoping to get further diuresis.  Tolerated trilogy last evening.  Wife at bedside  Objective: Vitals:   05/03/21 0833 05/03/21 0857 05/03/21 1104 05/03/21 1409  BP:  (!) 139/92    Pulse:  (!) 103    Resp:      Temp:      TempSrc:      SpO2: 99%  96% 100%  Weight:      Height:        Intake/Output Summary (Last 24 hours) at 05/03/2021 1708 Last data filed at 05/03/2021 0900 Gross per 24 hour  Intake 360 ml  Output 1520 ml  Net -1160 ml   Filed Weights   05/01/21 1000 05/02/21 0500 05/03/21 0500  Weight: (!) 151.3 kg (!) 149.9 kg (!) 148 kg    Examination:  General exam: Appears comfortable Respiratory system: decreased breath sounds b/l, rales at the bases Cardiovascular system: S1/S2+. No rubs or gallops, tachycardic Gastrointestinal system: Abd is soft, obese & hypoactive bowel sounds Central nervous system: alert and oriented. Moves all extremities  Psychiatry: Judgement and insight appears normal. Flat mood and affect     Data Reviewed: I have personally reviewed following labs and imaging studies  CBC: Recent Labs  Lab 04/28/21 0535 04/29/21 0502 04/30/21 0529 05/01/21 0428 05/02/21 0457  WBC 7.9 8.8 9.5 10.5 10.0  NEUTROABS 5.6  --   --   --   --   HGB 12.6* 12.1* 12.7* 12.4* 13.1  HCT 39.7 37.8* 40.5 39.3 41.5  MCV 101.8* 101.3* 101.8* 101.8* 102.0*  PLT 183 175 170 170 156   Basic Metabolic Panel: Recent Labs  Lab 04/28/21 0535 04/29/21 0502 04/30/21 0529 05/01/21 0428 05/02/21 0457  NA 138 138 138 140 139  K 4.2 4.2 4.3 4.5 4.4  CL 94* 97* 96* 98 95*  CO2 35* 37* 33* 35* 37*  GLUCOSE 123*  109* 131* 147* 132*  BUN '16 16 16 18 19  ' CREATININE 0.80 0.70 0.71 0.81 0.68  CALCIUM 8.9 8.7* 8.8* 8.9 9.3  MG 2.1  --  2.1  --   --   PHOS 4.1  --   --   --   --    GFR: Estimated Creatinine Clearance: 141.3 mL/min (by C-G formula based on SCr of 0.68 mg/dL). Liver Function Tests: No results for input(s): AST, ALT, ALKPHOS, BILITOT, PROT, ALBUMIN in the last 168 hours.  No results for input(s): LIPASE, AMYLASE in the last 168 hours. No results for input(s): AMMONIA in the last 168 hours. Coagulation Profile: No results for input(s): INR, PROTIME in the last 168 hours. Cardiac Enzymes: No results for input(s): CKTOTAL, CKMB, CKMBINDEX, TROPONINI in the last 168 hours. BNP (last 3 results) No results for input(s): PROBNP in the last 8760 hours. HbA1C: No results for input(s): HGBA1C  in the last 72 hours.  CBG: Recent Labs  Lab 05/02/21 1939 05/02/21 2139 05/03/21 0712 05/03/21 1057 05/03/21 1518  GLUCAP 177* 198* 145* 177* 135*   Lipid Profile: No results for input(s): CHOL, HDL, LDLCALC, TRIG, CHOLHDL, LDLDIRECT in the last 72 hours. Thyroid Function Tests: No results for input(s): TSH, T4TOTAL, FREET4, T3FREE, THYROIDAB in the last 72 hours. Anemia Panel: No results for input(s): VITAMINB12, FOLATE, FERRITIN, TIBC, IRON, RETICCTPCT in the last 72 hours. Sepsis Labs: Recent Labs  Lab 05/02/21 0457 05/03/21 0510  PROCALCITON <0.10 <0.10     Recent Results (from the past 240 hour(s))  Blood culture (routine x 2)     Status: None   Collection Time: 04/25/21  8:44 AM   Specimen: BLOOD  Result Value Ref Range Status   Specimen Description BLOOD RIGHT ANTECUBITAL  Final   Special Requests   Final    BOTTLES DRAWN AEROBIC AND ANAEROBIC Blood Culture results may not be optimal due to an inadequate volume of blood received in culture bottles   Culture   Final    NO GROWTH 5 DAYS Performed at [facility], [address] [place], [place] [address]     Report Status 04/30/2021 FINAL  Final  Resp Panel by RT-PCR (Flu A&B, Covid) Nasopharyngeal Swab     Status: None   Collection Time: 04/25/21  8:48 AM   Specimen: Nasopharyngeal Swab; Nasopharyngeal(NP) swabs in vial transport medium  Result Value Ref Range Status   SARS Coronavirus 2 by RT PCR NEGATIVE NEGATIVE Final    Comment: (NOTE) SARS-CoV-2 target nucleic acids are NOT DETECTED.  The SARS-CoV-2 RNA is generally detectable in upper respiratory specimens during the acute phase of infection. The lowest concentration of SARS-CoV-2 viral copies this assay can detect is 138 copies/mL. A negative result does not preclude SARS-Cov-2 infection and should not be used as the sole basis for treatment or other patient management decisions. A negative result may occur with  improper specimen collection/handling, submission of specimen other than nasopharyngeal swab, presence of viral mutation(s) within the areas targeted by this assay, and inadequate number of viral copies(<138 copies/mL). A negative result must be combined with clinical observations, patient history, and epidemiological information. The expected result is Negative.  Fact Sheet for Patients:  EntrepreneurPulse.com.au  Fact Sheet for Healthcare Providers:  IncredibleEmployment.be  This test is no t yet approved or cleared by the [place] FDA and  has been authorized for detection and/or diagnosis of SARS-CoV-2 by FDA under an Emergency Use Authorization (EUA). This EUA will remain  in effect (meaning this test can be used) for the duration of the COVID-19 declaration under Section 564(b)(1) of the Act, 21 U.S.C.section 360bbb-3(b)(1), unless the authorization is terminated  or revoked sooner.       Influenza A by PCR NEGATIVE NEGATIVE Final   Influenza B by PCR NEGATIVE NEGATIVE Final    Comment: (NOTE) The Xpert Xpress SARS-CoV-2/FLU/RSV plus assay is intended as an aid in  the diagnosis of influenza from Nasopharyngeal swab specimens and should not be used as a sole basis for treatment. Nasal washings and aspirates are unacceptable for Xpert Xpress SARS-CoV-2/FLU/RSV testing.  Fact Sheet for Patients: EntrepreneurPulse.com.au  Fact Sheet for Healthcare Providers: IncredibleEmployment.be  This test is not yet approved or cleared by the [place] FDA and has been authorized for detection and/or diagnosis of SARS-CoV-2 by FDA under an Emergency Use Authorization (EUA). This EUA will remain in effect (meaning this test can be used)  for the duration of the COVID-19 declaration under Section 564(b)(1) of the Act, 21 U.S.C. section 360bbb-3(b)(1), unless the authorization is terminated or revoked.  Performed at [facility], [address] [place], [place] [address]   Blood culture (routine x 2)     Status: None   Collection Time: 04/25/21  8:49 AM   Specimen: BLOOD  Result Value Ref Range Status   Specimen Description BLOOD BLOOD RIGHT FOREARM  Final   Special Requests   Final    BOTTLES DRAWN AEROBIC AND ANAEROBIC Blood Culture results may not be optimal due to an inadequate volume of blood received in culture bottles   Culture   Final    NO GROWTH 5 DAYS Performed at [facility], [address], [place], [place] [address]    Report Status 04/30/2021 FINAL  Final  MRSA Next Gen by PCR, Nasal     Status: Abnormal   Collection Time: 04/25/21  3:41 PM   Specimen: Nasal Mucosa; Nasal Swab  Result Value Ref Range Status   MRSA by PCR Next Gen DETECTED (A) NOT DETECTED Final    Comment: RESULT CALLED TO, READ BACK BY AND VERIFIED WITH: MATHEW WRIGHT '@[id number]'  04/25/21 MJU (NOTE) The GeneXpert MRSA Assay (FDA approved for NASAL specimens only), is one component of a comprehensive MRSA colonization surveillance program. It is not intended to diagnose MRSA infection nor to guide or monitor treatment  for MRSA infections. Test performance is not FDA approved in patients less than 76 years old. Performed at [facility], [address], [place],  [address]          Radiology Studies: DG Chest 1 View  Result Date: 05/03/2021 CLINICAL DATA:  Shortness of breath. EXAM: CHEST  1 VIEW COMPARISON:  Chest radiograph dated 05/02/2021. FINDINGS: Tracheostomy with tip above the carina. There is cardiomegaly with central vascular congestion. Bilateral pulmonary opacities similar or slightly progressed on the left and may represent atelectasis or infiltrate. Probable small right pleural effusion. No pneumothorax. No acute osseous pathology. IMPRESSION: 1. Cardiomegaly with central vascular congestion. 2. Bilateral pulmonary opacities may represent atelectasis or infiltrate. Overall slight interval progression of pulmonary opacities compared to the prior radiograph. Electronically Signed   By: Anner Crete M.D.   On: 05/03/2021 03:39   DG Chest Port 1 View  Result Date: 05/02/2021 CLINICAL DATA:  Cough, shortness of breath EXAM: PORTABLE CHEST 1 VIEW COMPARISON:  04/25/2021 FINDINGS: Tracheostomy is unchanged. Cardiomegaly, vascular congestion. Bilateral lower lobe opacities, likely atelectasis. Scarring in the left lung base. No visible effusions or acute bony abnormality. IMPRESSION: Bibasilar opacities, likely atelectasis. Cardiomegaly, vascular congestion. Electronically Signed   By: Rolm Baptise M.D.   On: 05/02/2021 08:53        Scheduled Meds:  ARIPiprazole  5 mg Oral Daily   budesonide (PULMICORT) nebulizer solution  0.5 mg Nebulization BID   enoxaparin (LOVENOX) injection  0.5 mg/kg Subcutaneous Q24H   famotidine  20 mg Oral BID   furosemide  40 mg Oral BID   insulin aspart  0-15 Units Subcutaneous TID WC   insulin aspart  0-5 Units Subcutaneous QHS   ipratropium-albuterol  3 mL Nebulization TID   metoprolol tartrate  50 mg Oral BID   Continuous Infusions:   LOS:  8 days    Time spent: 25 mins     Max Sane, MD [facility] Pager [phone]  If 7PM-7AM, please contact night-coverage 05/03/2021, 5:08 PM

## 2021-05-03 NOTE — Progress Notes (Signed)
Pt wife was instructed by home health company on use of pt home vent. Assisted wife with placement of pt home vent for hours of sleep. Wife seemed comfortable with placement. Pt and RN aware that this RT is available for any questions.

## 2021-05-04 ENCOUNTER — Inpatient Hospital Stay: Payer: PPO

## 2021-05-04 DIAGNOSIS — R5081 Fever presenting with conditions classified elsewhere: Secondary | ICD-10-CM

## 2021-05-04 LAB — BASIC METABOLIC PANEL
Anion gap: 7 (ref 5–15)
BUN: 22 mg/dL (ref 8–23)
CO2: 39 mmol/L — ABNORMAL HIGH (ref 22–32)
Calcium: 9.1 mg/dL (ref 8.9–10.3)
Chloride: 94 mmol/L — ABNORMAL LOW (ref 98–111)
Creatinine, Ser: 0.83 mg/dL (ref 0.61–1.24)
GFR, Estimated: >60 mL/min (ref >60)
Glucose, Bld: 140 mg/dL — ABNORMAL HIGH (ref 70–99)
Potassium: 4.6 mmol/L (ref 3.5–5.1)
Sodium: 140 mmol/L (ref 135–145)

## 2021-05-04 LAB — CBC
HCT: 41 % (ref 39.0–52.0)
Hemoglobin: 13.2 g/dL (ref 13.0–17.0)
MCH: 32.9 pg (ref 26.0–34.0)
MCHC: 32.2 g/dL (ref 30.0–36.0)
MCV: 102.2 fL — ABNORMAL HIGH (ref 80.0–100.0)
Platelets: 163 10*3/uL (ref 150–400)
RBC: 4.01 MIL/uL — ABNORMAL LOW (ref 4.22–5.81)
RDW: 14 % (ref 11.5–15.5)
WBC: 9 10*3/uL (ref 4.0–10.5)
nRBC: 0 % (ref 0.0–0.2)

## 2021-05-04 LAB — PROCALCITONIN: Procalcitonin: <0.1 ng/mL

## 2021-05-04 LAB — GLUCOSE, CAPILLARY
Glucose-Capillary: 121 mg/dL — ABNORMAL HIGH (ref 70–99)
Glucose-Capillary: 264 mg/dL — ABNORMAL HIGH (ref 70–99)

## 2021-05-04 MED ORDER — FAMOTIDINE 20 MG PO TABS: 20.0000 mg | ORAL_TABLET | Freq: Two times a day (BID) | ORAL | 0 refills | Status: AC

## 2021-05-04 MED ORDER — FUROSEMIDE 40 MG PO TABS: 40.0000 mg | ORAL_TABLET | Freq: Two times a day (BID) | ORAL | 0 refills | Status: DC

## 2021-05-04 MED ORDER — METOPROLOL TARTRATE 50 MG PO TABS: 50.0000 mg | ORAL_TABLET | Freq: Two times a day (BID) | ORAL | 0 refills | Status: DC

## 2021-05-04 NOTE — Progress Notes (Signed)
NAME:  Eddie Chapman, MRN:  [id number], DOB:  1961/04/21, LOS: 9 ADMISSION DATE:  04/25/2021, CONSULTATION DATE:04/25/2021 REFERRING MD: Dr. Quentin Cornwall, CHIEF COMPLAINT:  Shortness of Breath  History of Present Illness:  This is a 60 yo male with complex comorbid history including systolic CHF chronically, pulmonary hypertension, obstructive sleep apnea, obesity hypoventilation syndrome, chronic tracheostomy, and COPD.  He presented to [facility] ER on 07/27 via EMS from home with c/o shortness of breath.  He was discharged from [facility] to [place] on 07/7 following prolonged hospitalization due to acute on chronic hypoxic hypercapnic respiratory failure secondary to acinotobacter species pneumonia and AECOPD requiring tracheostomy.  He was discharged from [facility] to home on 07/26.  Per ED notes pts family reports they DO NOT know how to take care of his tracheostomy at home.  EMS reported upon their arrival at pts home pt diaphoretic with O2 sats 85% on RA and diaphoretic, therefore he was placed on 10L of O2 and received 125 mg solumedrol.  He remained alert and oriented.   ED Course Upon arrival to the ER pt noted to have increased work of breathing despite 10L O2 via trach collar.  VBG revealed pH 7.27/CO2 97/acid-base excess 12.5/bicarb 44.5.  He was therefore placed on mechanical ventilation via chronic tracheostomy.  CXR revealed chronic lung changes.  He received azithromycin, cefepime, vancomycin, and duoneb x1.  PCCM team contacted for ICU admission.     Events:   Pt admitted to ICU on mechanical ventilation via chronic tracheostomy  04/30/21- - met with wife and patient reviewed NIV needs, set up education session for patient and family to confirm no issues using bipap at home.  05/01/21- patient is improved, he is being optimized medically.  Plan for Education session for NIV tommor at 1130 with overnight trial utilizing home device. 05/02/21- patient for NIV trial today. He is retaining fluid and will  be diruesed today. Plan for educational session with RT from [employer].   05/03/21- patient is better clinically, I met with his GF and they both wish to try one more night with trache and ventilator to get familiar with settings and suctioining prior to dc in am. They are appreciative of care.  CXR today is improved with residual atelectasis due to shalow breathing.  05/04/21- patient is being optimized for dc  Pertinent  Medical History  COPD OSA Morbid Obesity Hypertension Hyperlipidemia GERD Depression T8 Vertebral Fracture Subarachnoid Hemorrhage  Chronic Tracheostomy     Objective   Blood pressure (!) 139/95, pulse 99, temperature 98.6 F (37 C), temperature source Oral, resp. rate (!) 27, height 5' 10" (1.778 m), weight (!) 150.2 kg, SpO2 97 %.    FiO2 (%):  [2 %-28 %] 28 %   Intake/Output Summary (Last 24 hours) at 05/04/2021 0859 Last data filed at 05/04/2021 0800 Gross per 24 hour  Intake 910 ml  Output 2326 ml  Net -1416 ml    Filed Weights   05/02/21 0500 05/03/21 0500 05/04/21 0500  Weight: (!) 149.9 kg (!) 148 kg (!) 150.2 kg     Review of Systems: +SOB Alert and awake Other:  All other systems negative  PHYSICAL EXAMINATION:  GENERAL: Mild distress due to cough/trache HEAD: Normocephalic, atraumatic.  EYES: Pupils equal, round, reactive to light.  No scleral icterus.  MOUTH: Moist mucosal membrane. NECK: Supple. S/p trach PULMONARY: +rhonchi CARDIOVASCULAR: S1 and S2. Regular rate and rhythm. No murmurs, rubs, or gallops.  GASTROINTESTINAL: Soft, nontender, -distended. Positive bowel sounds.  MUSCULOSKELETAL: No swelling, clubbing, or edema.  NEUROLOGIC: alert and awake SKIN:intact,warm,dry      Assessment & Plan:  Severe hypercapnic resp failure  secondary to AECOPD amd Obesity Hypoventilation Syndrome Mechanical Ventilation via Chronic Tracheostomy  Hx: Pulmonary HTN, Morbid Obesity Pickwikian syndrome Wean to PS mode   SEVERE COPD  EXACERBATION -continue IV steroids as prescribed -continue NEB THERAPY as prescribed -morphine as needed -wean fio2 as needed and tolerated  ELECTROLYTES -follow labs as needed -replace as needed -pharmacy consultation and following      Best Practice (right click and "Reselect all SmartList Selections" daily)   Diet/type: NPO; if pt remains mechanical ventilated via tracheostomy in the next 24hrs will place order for NG tube for TF's  DVT prophylaxis: LMWH GI prophylaxis: H2B Lines: N/A Foley:  N/A Code Status:  DNR Last date of multidisciplinary goals of care discussion [04/25/2021]  Labs   CBC: Recent Labs  Lab 04/28/21 0535 04/29/21 0502 04/30/21 0529 05/01/21 0428 05/02/21 0457 05/04/21 0611  WBC 7.9 8.8 9.5 10.5 10.0 9.0  NEUTROABS 5.6  --   --   --   --   --   HGB 12.6* 12.1* 12.7* 12.4* 13.1 13.2  HCT 39.7 37.8* 40.5 39.3 41.5 41.0  MCV 101.8* 101.3* 101.8* 101.8* 102.0* 102.2*  PLT 183 175 170 170 182 163     Basic Metabolic Panel: Recent Labs  Lab 04/28/21 0535 04/29/21 0502 04/30/21 0529 05/01/21 0428 05/02/21 0457 05/04/21 0611  NA 138 138 138 140 139 140  K 4.2 4.2 4.3 4.5 4.4 4.6  CL 94* 97* 96* 98 95* 94*  CO2 35* 37* 33* 35* 37* 39*  GLUCOSE 123* 109* 131* 147* 132* 140*  BUN _0 CREATININE 0.80 0.70 0.71 0.81 0.68 0.83  CALCIUM 8.9 8.7* 8.8* 8.9 9.3 9.1  MG 2.1  --  2.1  --   --   --   PHOS 4.1  --   --   --   --   --     GFR: Estimated Creatinine Clearance: 137.4 mL/min (by C-G formula based on SCr of 0.83 mg/dL). Recent Labs  Lab 04/30/21 0529 05/01/21 0428 05/02/21 0457 05/03/21 0510 05/04/21 0611  PROCALCITON  --   --  <0.10 <0.10 <0.10  WBC 9.5 10.5 10.0  --  9.0     Liver Function Tests: No results for input(s): AST, ALT, ALKPHOS, BILITOT, PROT, ALBUMIN in the last 168 hours.  ABG    Component Value Date/Time   PHART 7.55 (H) 04/25/2021 1652   PCO2ART 44 04/25/2021 1652   PO2ART 83 04/25/2021  1652   HCO3 38.5 (H) 04/25/2021 1652   O2SAT 97.4 04/25/2021 1652        DVT/GI PRX  assessed I Assessed the need for Labs I Assessed the need for Foley I Assessed the need for Central Venous Line Family Discussion when available I Assessed the need for Mobilization I made an Assessment of medications to be adjusted accordingly Safety Risk assessment completed  CASE DISCUSSED IN MULTIDISCIPLINARY ROUNDS WITH ICU TEAM     Critical Care Time devoted to patient care services described in this note is 34 minutes.  Critical care was necessary to treat /prevent imminent and life-threatening deterioration.   Ottie Glazier, M.D.  [facility] [place]

## 2021-05-04 NOTE — TOC Transition Note (Signed)
Transition of Care [facility]) - CM/SW Discharge Note   Patient Details  Name: Eddie Chapman MRN: [id number] Date of Birth: October 31, 1960  Transition of Care [facility]) CM/SW Contact:  Kerin Salen, RN Phone Number: 05/04/2021, 11:38 AM   Clinical Narrative:  Patient to discharge to home today via [place] [facility] [facility]. Patient with Trilogy, education done patient voices confidence with taking care of Trach. [facility] to provide PT/OT services. TOC barriers resolved.     Final next level of care: [place] Barriers to Discharge: Barriers Resolved   Patient Goals and CMS Choice Patient states their goals for this hospitalization and ongoing recovery are:: To return home with Home Health CMS Medicare.gov Compare Post Acute Care list provided to:: Patient Choice offered to / list presented to : Patient  Discharge Placement                Patient to be transferred to facility by: [facility] Name of family member notified: [name] Patient and family notified of of transfer: 05/04/21  Discharge Plan and Services In-house Referral: Clinical Social Work   Post Acute Care Choice: Museum/gallery conservator, Home Health          DME Arranged: Other see comment (Trilogy) DME Agency: [employer] Date DME Agency Contacted: 05/04/21 Time DME Agency Contacted: [id number] Representative spoke with at DME Agency: [place]: PT, OT [place] Agency: [facility] Date [facility] Agency Contacted: 05/03/21 Time [place]: 1400 Representative spoke with at [place]: [place] ([facility]) Interventions     Readmission Risk Interventions No flowsheet data found.

## 2021-05-04 NOTE — Progress Notes (Signed)
Patient discharged @ 1300. Follow up appointment booked. Paperwork give to pt's girlfriend.

## 2021-05-05 NOTE — Discharge Summary (Signed)
[place] [place] NAME: Eddie Chapman    MR#:  [id number]  DATE OF BIRTH:  Nov 24, 1960  DATE OF ADMISSION:  04/25/2021   ADMITTING PHYSICIAN: No admitting provider for patient encounter.  DATE OF DISCHARGE: 05/04/2021  1:40 PM  PRIMARY CARE PHYSICIAN: Adin Hector, MD   ADMISSION DIAGNOSIS:  Acute on chronic respiratory failure with hypercapnia (HCC) [J96.22] Acute respiratory failure with hypoxia and hypercapnia (HCC) [J96.01, J96.02] DISCHARGE DIAGNOSIS:  Active Problems:   Acute on chronic respiratory failure with hypercapnia (HCC)  SECONDARY DIAGNOSIS:   Past Medical History:  Diagnosis Date   Adenomatous colon polyp    Depression    Ear drum perforation    left x2    GERD (gastroesophageal reflux disease)    Hyperlipidemia    Hypertension    T8 vertebral fracture [facility])    HOSPITAL COURSE:  This is a 60 yo male with complex comorbid history including systolic CHF chronically, pulmonary hypertension, obstructive sleep apnea, obesity hypoventilation syndrome, chronic tracheostomy, and COPD. He presented to [facility] ER on 07/27 via EMS from home with c/o shortness of breath.  Acute on chronic hypercapnic respiratory failure: Present on admission secondary to COPD, obesity hypoventilation syndrome and underlying chronic diastolic heart failure.  Now resolved - hx of chronic tracheostomy. Continue w/ trach collar during the day and mechanical ventilation/trilogy at night.  - Net IO Since Admission: -9,501.65 mL [05/03/21 1708]    Essential HTN/sinus tachycardia: Controlled on home regimen  Hyperglycemia: HbA1c 5.6.    Chronic pain: continue home regimen  Morbid obesity: BMI 46.1. Complicates overall care & prognosis.   DISCHARGE CONDITIONS:  Stable CONSULTS OBTAINED:  Treatment Team:  [facility], [facility], MD DRUG ALLERGIES:   Allergies  Allergen Reactions   Amlodipine Swelling    Ankle swelling    Divalproex Sodium Other (See  Comments)    Elevated liver enzymes   DISCHARGE MEDICATIONS:   Allergies as of 05/04/2021       Reactions   Amlodipine Swelling   Ankle swelling    Divalproex Sodium Other (See Comments)   Elevated liver enzymes        Medication List     STOP taking these medications    enoxaparin 80 MG/0.8ML injection Commonly known as: LOVENOX   insulin aspart 100 UNIT/ML injection Commonly known as: novoLOG   metoprolol tartrate 5 MG/5ML Soln injection Commonly known as: LOPRESSOR Replaced by: metoprolol tartrate 50 MG tablet   sodium chloride 0.9 % infusion       TAKE these medications    ARIPiprazole 5 MG tablet Commonly known as: ABILIFY Take 1 tablet (5 mg total) by mouth daily.   ascorbic acid 250 MG tablet Commonly known as: VITAMIN C Take 1 tablet (250 mg total) by mouth 2 (two) times daily.   bisacodyl 10 MG suppository Commonly known as: DULCOLAX Place 1 suppository (10 mg total) rectally daily as needed for moderate constipation.   chlorhexidine gluconate (MEDLINE KIT) 0.12 % solution Commonly known as: PERIDEX 15 mLs by Mouth Rinse route 2 (two) times daily.   Chlorhexidine Gluconate Cloth 2 % Pads Apply 6 each topically daily.   docusate 50 MG/5ML liquid Commonly known as: COLACE Place 10 mLs (100 mg total) into feeding tube 2 (two) times daily.   famotidine 20 MG tablet Commonly known as: PEPCID Take 1 tablet (20 mg total) by mouth 2 (two) times daily.   feeding supplement Liqd Take  237 mLs by mouth 3 (three) times daily between meals.   furosemide 40 MG tablet Commonly known as: LASIX Take 1 tablet (40 mg total) by mouth 2 (two) times daily.   ipratropium-albuterol 0.5-2.5 (3) MG/3ML Soln Commonly known as: DUONEB Take 3 mLs by nebulization 3 (three) times daily.   metoprolol tartrate 50 MG tablet Commonly known as: LOPRESSOR Take 1 tablet (50 mg total) by mouth 2 (two) times daily. Replaces: metoprolol tartrate 5 MG/5ML Soln injection    Oxycodone HCl 10 MG Tabs Place 1 tablet (10 mg total) into feeding tube every 6 (six) hours as needed for moderate pain.   polyethylene glycol 17 g packet Commonly known as: MIRALAX / GLYCOLAX Take 17 g by mouth daily.   polyvinyl alcohol 1.4 % ophthalmic solution Commonly known as: LIQUIFILM TEARS Place 1 drop into both eyes as needed for dry eyes.   senna-docusate 8.6-50 MG tablet Commonly known as: Senokot-S Take 1 tablet by mouth 2 (two) times daily.       DISCHARGE INSTRUCTIONS:   DIET:  Cardiac diet DISCHARGE CONDITION:  Stable ACTIVITY:  Activity as tolerated OXYGEN:  Home Oxygen: No.  Oxygen Delivery: room air DISCHARGE LOCATION:  home with home health PT and OT  If you experience worsening of your admission symptoms, develop shortness of breath, life threatening emergency, suicidal or homicidal thoughts you must seek medical attention immediately by calling 911 or calling your MD immediately  if symptoms less severe.  You Must read complete instructions/literature along with all the possible adverse reactions/side effects for all the Medicines you take and that have been prescribed to you. Take any new Medicines after you have completely understood and accpet all the possible adverse reactions/side effects.   Please note  You were cared for by a hospitalist during your hospital stay. If you have any questions about your discharge medications or the care you received while you were in the hospital after you are discharged, you can call the unit and asked to speak with the hospitalist on call if the hospitalist that took care of you is not available. Once you are discharged, your primary care physician will handle any further medical issues. Please note that NO REFILLS for any discharge medications will be authorized once you are discharged, as it is imperative that you return to your primary care physician (or establish a relationship with a primary care physician if  you do not have one) for your aftercare needs so that they can reassess your need for medications and monitor your lab values.    On the day of Discharge:  VITAL SIGNS:  Blood pressure (!) 130/100, pulse 92, temperature 98.6 F (37 C), temperature source Oral, resp. rate 18, height '5\' 10"'  (1.778 m), weight (!) 150.2 kg, SpO2 98 %. PHYSICAL EXAMINATION:  GENERAL:  60 y.o.-year-old patient lying in the bed with no acute distress.  EYES: Pupils equal, round, reactive to light and accommodation. No scleral icterus. Extraocular muscles intact.  HEENT: Head atraumatic, normocephalic. Oropharynx and nasopharynx clear.  NECK:  Supple, no jugular venous distention. No thyroid enlargement, no tenderness.  LUNGS: Normal breath sounds bilaterally, no wheezing, rales,rhonchi or crepitation. No use of accessory muscles of respiration.  CARDIOVASCULAR: S1, S2 normal. No murmurs, rubs, or gallops.  ABDOMEN: Soft, non-tender, non-distended. Bowel sounds present. No organomegaly or mass.  EXTREMITIES: No pedal edema, cyanosis, or clubbing.  NEUROLOGIC: Cranial nerves II through XII are intact. Muscle strength 5/5 in all extremities. Sensation intact. Gait not checked.  PSYCHIATRIC: The patient is alert and oriented x 3.  SKIN: No obvious rash, lesion, or ulcer.  DATA REVIEW:   CBC Recent Labs  Lab 05/04/21 0611  WBC 9.0  HGB 13.2  HCT 41.0  PLT 163    Chemistries  Recent Labs  Lab 04/30/21 0529 05/01/21 0428 05/04/21 0611  NA 138   < > 140  K 4.3   < > 4.6  CL 96*   < > 94*  CO2 33*   < > 39*  GLUCOSE 131*   < > 140*  BUN 16   < > 22  CREATININE 0.71   < > 0.83  CALCIUM 8.8*   < > 9.1  MG 2.1  --   --    < > = values in this interval not displayed.     Outpatient follow-up  Follow-up Information     Tama High III, MD. Schedule an appointment as soon as possible for a visit in 3 day(s).   Specialty: Internal Medicine Why: [facility] Discharge F/UP Contact  information: [address] [place] [facility] [place] [address] [phone]         Ottie Glazier, MD. Schedule an appointment as soon as possible for a visit in 1 week(s).   Specialty: Pulmonary Disease Why: [facility] Discharge F/UP Contact information: [place] [address] [phone]                 30 Day Unplanned Readmission Risk Score    Flowsheet Row ED to Hosp-Admission (Discharged) from 04/25/2021 in [place] ICU/CCU  30 Day Unplanned Readmission Risk Score (%) 19.89 Filed at 05/04/2021 1200       This score is the patient's risk of an unplanned readmission within 30 days of being discharged (0 -100%). The score is based on dignosis, age, lab data, medications, orders, and past utilization.   Low:  0-14.9   Medium: 15-21.9   High: 22-29.9   Extreme: 30 and above          Management plans discussed with the patient, family and they are in agreement.  CODE STATUS: Prior   TOTAL TIME TAKING CARE OF THIS PATIENT: 45 minutes.    Max Sane M.D on 05/05/2021 at 5:37 PM  [facility]   CC: Primary care physician; Adin Hector, MD   Note: This dictation was prepared with Dragon dictation along with smaller phrase technology. Any transcriptional errors that result from this process are unintentional.

## 2021-05-08 DIAGNOSIS — K219 Gastro-esophageal reflux disease without esophagitis: Secondary | ICD-10-CM | POA: Diagnosis not present

## 2021-05-08 DIAGNOSIS — Z6841 Body Mass Index (BMI) 40.0 and over, adult: Secondary | ICD-10-CM | POA: Diagnosis not present

## 2021-05-08 DIAGNOSIS — M1990 Unspecified osteoarthritis, unspecified site: Secondary | ICD-10-CM | POA: Diagnosis not present

## 2021-05-08 DIAGNOSIS — R1310 Dysphagia, unspecified: Secondary | ICD-10-CM | POA: Diagnosis not present

## 2021-05-08 DIAGNOSIS — J9612 Chronic respiratory failure with hypercapnia: Secondary | ICD-10-CM | POA: Diagnosis not present

## 2021-05-08 DIAGNOSIS — Z86010 Personal history of colonic polyps: Secondary | ICD-10-CM | POA: Diagnosis not present

## 2021-05-08 DIAGNOSIS — M5412 Radiculopathy, cervical region: Secondary | ICD-10-CM | POA: Diagnosis not present

## 2021-05-08 DIAGNOSIS — M47814 Spondylosis without myelopathy or radiculopathy, thoracic region: Secondary | ICD-10-CM | POA: Diagnosis not present

## 2021-05-08 DIAGNOSIS — Z7982 Long term (current) use of aspirin: Secondary | ICD-10-CM | POA: Diagnosis not present

## 2021-05-08 DIAGNOSIS — G894 Chronic pain syndrome: Secondary | ICD-10-CM | POA: Diagnosis not present

## 2021-05-08 DIAGNOSIS — J9611 Chronic respiratory failure with hypoxia: Secondary | ICD-10-CM | POA: Diagnosis not present

## 2021-05-08 DIAGNOSIS — Z8701 Personal history of pneumonia (recurrent): Secondary | ICD-10-CM | POA: Diagnosis not present

## 2021-05-08 DIAGNOSIS — Z9181 History of falling: Secondary | ICD-10-CM | POA: Diagnosis not present

## 2021-05-08 DIAGNOSIS — I272 Pulmonary hypertension, unspecified: Secondary | ICD-10-CM | POA: Diagnosis not present

## 2021-05-08 DIAGNOSIS — E785 Hyperlipidemia, unspecified: Secondary | ICD-10-CM | POA: Diagnosis not present

## 2021-05-08 DIAGNOSIS — N400 Enlarged prostate without lower urinary tract symptoms: Secondary | ICD-10-CM | POA: Diagnosis not present

## 2021-05-08 DIAGNOSIS — J449 Chronic obstructive pulmonary disease, unspecified: Secondary | ICD-10-CM | POA: Diagnosis not present

## 2021-05-08 DIAGNOSIS — M519 Unspecified thoracic, thoracolumbar and lumbosacral intervertebral disc disorder: Secondary | ICD-10-CM | POA: Diagnosis not present

## 2021-05-08 DIAGNOSIS — I5022 Chronic systolic (congestive) heart failure: Secondary | ICD-10-CM | POA: Diagnosis not present

## 2021-05-08 DIAGNOSIS — I110 Hypertensive heart disease with heart failure: Secondary | ICD-10-CM | POA: Diagnosis not present

## 2021-05-08 DIAGNOSIS — E559 Vitamin D deficiency, unspecified: Secondary | ICD-10-CM | POA: Diagnosis not present

## 2021-05-08 DIAGNOSIS — G4733 Obstructive sleep apnea (adult) (pediatric): Secondary | ICD-10-CM | POA: Diagnosis not present

## 2021-05-08 DIAGNOSIS — F32A Depression, unspecified: Secondary | ICD-10-CM | POA: Diagnosis not present

## 2021-05-08 DIAGNOSIS — Z430 Encounter for attention to tracheostomy: Secondary | ICD-10-CM | POA: Diagnosis not present

## 2021-05-09 DIAGNOSIS — E349 Endocrine disorder, unspecified: Secondary | ICD-10-CM | POA: Diagnosis not present

## 2021-05-09 DIAGNOSIS — I5022 Chronic systolic (congestive) heart failure: Secondary | ICD-10-CM | POA: Diagnosis not present

## 2021-05-09 DIAGNOSIS — I10 Essential (primary) hypertension: Secondary | ICD-10-CM | POA: Diagnosis not present

## 2021-05-09 DIAGNOSIS — G4733 Obstructive sleep apnea (adult) (pediatric): Secondary | ICD-10-CM | POA: Diagnosis not present

## 2021-05-09 DIAGNOSIS — I739 Peripheral vascular disease, unspecified: Secondary | ICD-10-CM | POA: Diagnosis not present

## 2021-05-09 DIAGNOSIS — R5381 Other malaise: Secondary | ICD-10-CM | POA: Diagnosis not present

## 2021-05-09 DIAGNOSIS — R531 Weakness: Secondary | ICD-10-CM | POA: Diagnosis not present

## 2021-05-09 DIAGNOSIS — J9611 Chronic respiratory failure with hypoxia: Secondary | ICD-10-CM | POA: Diagnosis not present

## 2021-05-09 DIAGNOSIS — Z930 Tracheostomy status: Secondary | ICD-10-CM | POA: Diagnosis not present

## 2021-05-09 DIAGNOSIS — E785 Hyperlipidemia, unspecified: Secondary | ICD-10-CM | POA: Diagnosis not present

## 2021-05-09 DIAGNOSIS — R0902 Hypoxemia: Secondary | ICD-10-CM | POA: Diagnosis not present

## 2021-05-09 DIAGNOSIS — E559 Vitamin D deficiency, unspecified: Secondary | ICD-10-CM | POA: Diagnosis not present

## 2021-05-09 DIAGNOSIS — Z7401 Bed confinement status: Secondary | ICD-10-CM | POA: Diagnosis not present

## 2021-05-09 DIAGNOSIS — J9612 Chronic respiratory failure with hypercapnia: Secondary | ICD-10-CM | POA: Diagnosis not present

## 2021-05-09 DIAGNOSIS — I272 Pulmonary hypertension, unspecified: Secondary | ICD-10-CM | POA: Diagnosis not present

## 2021-05-10 ENCOUNTER — Inpatient Hospital Stay
Admission: EM | Admit: 2021-05-10 | Discharge: 2021-05-17 | DRG: 208 | Disposition: A | Discharge status: Home Health | Payer: PPO | Attending: Internal Medicine | Admitting: Internal Medicine

## 2021-05-10 ENCOUNTER — Emergency Department: Payer: PPO

## 2021-05-10 ENCOUNTER — Other Ambulatory Visit: Payer: Self-pay

## 2021-05-10 DIAGNOSIS — J9622 Acute and chronic respiratory failure with hypercapnia: Secondary | ICD-10-CM

## 2021-05-10 DIAGNOSIS — Z930 Tracheostomy status: Secondary | ICD-10-CM | POA: Diagnosis not present

## 2021-05-10 DIAGNOSIS — R069 Unspecified abnormalities of breathing: Secondary | ICD-10-CM | POA: Diagnosis not present

## 2021-05-10 DIAGNOSIS — R0902 Hypoxemia: Secondary | ICD-10-CM | POA: Diagnosis not present

## 2021-05-10 DIAGNOSIS — F32A Depression, unspecified: Secondary | ICD-10-CM | POA: Diagnosis not present

## 2021-05-10 DIAGNOSIS — I272 Pulmonary hypertension, unspecified: Secondary | ICD-10-CM | POA: Diagnosis not present

## 2021-05-10 DIAGNOSIS — Z7189 Other specified counseling: Secondary | ICD-10-CM | POA: Diagnosis not present

## 2021-05-10 DIAGNOSIS — Z9119 Patient's noncompliance with other medical treatment and regimen: Secondary | ICD-10-CM | POA: Diagnosis not present

## 2021-05-10 DIAGNOSIS — J441 Chronic obstructive pulmonary disease with (acute) exacerbation: Secondary | ICD-10-CM | POA: Diagnosis not present

## 2021-05-10 DIAGNOSIS — J189 Pneumonia, unspecified organism: Secondary | ICD-10-CM | POA: Diagnosis not present

## 2021-05-10 DIAGNOSIS — I10 Essential (primary) hypertension: Secondary | ICD-10-CM | POA: Diagnosis not present

## 2021-05-10 DIAGNOSIS — I110 Hypertensive heart disease with heart failure: Secondary | ICD-10-CM | POA: Diagnosis present

## 2021-05-10 DIAGNOSIS — I517 Cardiomegaly: Secondary | ICD-10-CM | POA: Diagnosis not present

## 2021-05-10 DIAGNOSIS — J9602 Acute respiratory failure with hypercapnia: Secondary | ICD-10-CM | POA: Diagnosis not present

## 2021-05-10 DIAGNOSIS — Z79899 Other long term (current) drug therapy: Secondary | ICD-10-CM

## 2021-05-10 DIAGNOSIS — I739 Peripheral vascular disease, unspecified: Secondary | ICD-10-CM | POA: Diagnosis not present

## 2021-05-10 DIAGNOSIS — T17990A Other foreign object in respiratory tract, part unspecified in causing asphyxiation, initial encounter: Secondary | ICD-10-CM | POA: Diagnosis not present

## 2021-05-10 DIAGNOSIS — R739 Hyperglycemia, unspecified: Secondary | ICD-10-CM | POA: Diagnosis not present

## 2021-05-10 DIAGNOSIS — Z9981 Dependence on supplemental oxygen: Secondary | ICD-10-CM | POA: Diagnosis not present

## 2021-05-10 DIAGNOSIS — J9612 Chronic respiratory failure with hypercapnia: Secondary | ICD-10-CM | POA: Diagnosis not present

## 2021-05-10 DIAGNOSIS — I2781 Cor pulmonale (chronic): Secondary | ICD-10-CM | POA: Diagnosis not present

## 2021-05-10 DIAGNOSIS — I5022 Chronic systolic (congestive) heart failure: Secondary | ICD-10-CM | POA: Diagnosis not present

## 2021-05-10 DIAGNOSIS — J90 Pleural effusion, not elsewhere classified: Secondary | ICD-10-CM | POA: Diagnosis not present

## 2021-05-10 DIAGNOSIS — E785 Hyperlipidemia, unspecified: Secondary | ICD-10-CM | POA: Diagnosis present

## 2021-05-10 DIAGNOSIS — J9690 Respiratory failure, unspecified, unspecified whether with hypoxia or hypercapnia: Secondary | ICD-10-CM | POA: Diagnosis not present

## 2021-05-10 DIAGNOSIS — Z6841 Body Mass Index (BMI) 40.0 and over, adult: Secondary | ICD-10-CM

## 2021-05-10 DIAGNOSIS — J9621 Acute and chronic respiratory failure with hypoxia: Secondary | ICD-10-CM | POA: Diagnosis not present

## 2021-05-10 DIAGNOSIS — I499 Cardiac arrhythmia, unspecified: Secondary | ICD-10-CM | POA: Diagnosis not present

## 2021-05-10 DIAGNOSIS — E349 Endocrine disorder, unspecified: Secondary | ICD-10-CM | POA: Diagnosis not present

## 2021-05-10 DIAGNOSIS — E662 Morbid (severe) obesity with alveolar hypoventilation: Secondary | ICD-10-CM | POA: Diagnosis not present

## 2021-05-10 DIAGNOSIS — Z888 Allergy status to other drugs, medicaments and biological substances status: Secondary | ICD-10-CM

## 2021-05-10 DIAGNOSIS — J9611 Chronic respiratory failure with hypoxia: Secondary | ICD-10-CM | POA: Diagnosis not present

## 2021-05-10 DIAGNOSIS — K219 Gastro-esophageal reflux disease without esophagitis: Secondary | ICD-10-CM | POA: Diagnosis present

## 2021-05-10 DIAGNOSIS — Z430 Encounter for attention to tracheostomy: Secondary | ICD-10-CM | POA: Diagnosis not present

## 2021-05-10 DIAGNOSIS — G4733 Obstructive sleep apnea (adult) (pediatric): Secondary | ICD-10-CM | POA: Diagnosis not present

## 2021-05-10 DIAGNOSIS — I5032 Chronic diastolic (congestive) heart failure: Secondary | ICD-10-CM | POA: Diagnosis not present

## 2021-05-10 DIAGNOSIS — J9601 Acute respiratory failure with hypoxia: Secondary | ICD-10-CM

## 2021-05-10 DIAGNOSIS — J449 Chronic obstructive pulmonary disease, unspecified: Secondary | ICD-10-CM | POA: Diagnosis not present

## 2021-05-10 DIAGNOSIS — J9600 Acute respiratory failure, unspecified whether with hypoxia or hypercapnia: Secondary | ICD-10-CM

## 2021-05-10 DIAGNOSIS — Z7401 Bed confinement status: Secondary | ICD-10-CM | POA: Diagnosis not present

## 2021-05-10 DIAGNOSIS — R0602 Shortness of breath: Secondary | ICD-10-CM | POA: Diagnosis not present

## 2021-05-10 DIAGNOSIS — E559 Vitamin D deficiency, unspecified: Secondary | ICD-10-CM | POA: Diagnosis not present

## 2021-05-10 DIAGNOSIS — Z20822 Contact with and (suspected) exposure to covid-19: Secondary | ICD-10-CM | POA: Diagnosis not present

## 2021-05-10 HISTORY — DX: Pneumonia, unspecified organism: J18.9

## 2021-05-10 LAB — BASIC METABOLIC PANEL
Anion gap: 8 (ref 5–15)
BUN: 19 mg/dL (ref 8–23)
CO2: 42 mmol/L — ABNORMAL HIGH (ref 22–32)
Calcium: 8.9 mg/dL (ref 8.9–10.3)
Chloride: 91 mmol/L — ABNORMAL LOW (ref 98–111)
Creatinine, Ser: 0.84 mg/dL (ref 0.61–1.24)
GFR, Estimated: >60 mL/min (ref >60)
Glucose, Bld: 154 mg/dL — ABNORMAL HIGH (ref 70–99)
Potassium: 4.6 mmol/L (ref 3.5–5.1)
Sodium: 141 mmol/L (ref 135–145)

## 2021-05-10 LAB — BLOOD GAS, ARTERIAL
Acid-Base Excess: 19.9 mmol/L — ABNORMAL HIGH (ref 0.0–2.0)
Acid-Base Excess: 17.6 mmol/L — ABNORMAL HIGH (ref 0.0–2.0)
Bicarbonate: 51.9 mmol/L — ABNORMAL HIGH (ref 20.0–28.0)
Bicarbonate: 39.7 mmol/L — ABNORMAL HIGH (ref 20.0–28.0)
FIO2: 0.4
FIO2: 0.4
O2 Saturation: 93.3 %
O2 Saturation: 98.1 %
Patient temperature: 37
Patient temperature: 37
Pressure control: 45 cmH2O
RATE: 24 resp/min
pCO2 arterial: 103 mmHg (ref 32.0–48.0)
pCO2 arterial: 36 mmHg (ref 32.0–48.0)
pH, Arterial: 7.31 — ABNORMAL LOW (ref 7.350–7.450)
pH, Arterial: 7.65 (ref 7.350–7.450)
pO2, Arterial: 74 mmHg — ABNORMAL LOW (ref 83.0–108.0)
pO2, Arterial: 84 mmHg (ref 83.0–108.0)

## 2021-05-10 LAB — GLUCOSE, CAPILLARY: Glucose-Capillary: 180 mg/dL — ABNORMAL HIGH (ref 70–99)

## 2021-05-10 LAB — BRAIN NATRIURETIC PEPTIDE: B Natriuretic Peptide: 32.3 pg/mL (ref 0.0–100.0)

## 2021-05-10 LAB — CBC WITH DIFFERENTIAL/PLATELET
Abs Immature Granulocytes: 0.06 10*3/uL (ref 0.00–0.07)
Basophils Absolute: 0 10*3/uL (ref 0.0–0.1)
Basophils Relative: 0 %
Eosinophils Absolute: 0.1 10*3/uL (ref 0.0–0.5)
Eosinophils Relative: 1 %
HCT: 42.9 % (ref 39.0–52.0)
Hemoglobin: 13.6 g/dL (ref 13.0–17.0)
Immature Granulocytes: 1 %
Lymphocytes Relative: 10 %
Lymphs Abs: 1.1 10*3/uL (ref 0.7–4.0)
MCH: 32.5 pg (ref 26.0–34.0)
MCHC: 31.7 g/dL (ref 30.0–36.0)
MCV: 102.6 fL — ABNORMAL HIGH (ref 80.0–100.0)
Monocytes Absolute: 1.1 10*3/uL — ABNORMAL HIGH (ref 0.1–1.0)
Monocytes Relative: 10 %
Neutro Abs: 8.7 10*3/uL — ABNORMAL HIGH (ref 1.7–7.7)
Neutrophils Relative %: 78 %
Platelets: 179 10*3/uL (ref 150–400)
RBC: 4.18 MIL/uL — ABNORMAL LOW (ref 4.22–5.81)
RDW: 13.6 % (ref 11.5–15.5)
WBC: 11 10*3/uL — ABNORMAL HIGH (ref 4.0–10.5)
nRBC: 0 % (ref 0.0–0.2)

## 2021-05-10 LAB — RESP PANEL BY RT-PCR (FLU A&B, COVID) ARPGX2
Influenza A by PCR: NEGATIVE
Influenza B by PCR: NEGATIVE
SARS Coronavirus 2 by RT PCR: NEGATIVE

## 2021-05-10 LAB — PROCALCITONIN: Procalcitonin: <0.1 ng/mL

## 2021-05-10 LAB — TROPONIN I (HIGH SENSITIVITY)
Troponin I (High Sensitivity): 11 ng/L (ref <18)
Troponin I (High Sensitivity): 11 ng/L (ref <18)

## 2021-05-10 LAB — CBG MONITORING, ED: Glucose-Capillary: 137 mg/dL — ABNORMAL HIGH (ref 70–99)

## 2021-05-10 MED ORDER — POLYETHYLENE GLYCOL 3350 17 G PO PACK: 17.0000 g | PACK | Freq: Every day | ORAL | Status: DC | PRN

## 2021-05-10 MED ORDER — DOCUSATE SODIUM 100 MG PO CAPS: 100.0000 mg | ORAL_CAPSULE | Freq: Two times a day (BID) | ORAL | Status: DC | PRN

## 2021-05-10 MED ORDER — ORAL CARE MOUTH RINSE
15.0000 mL | Freq: Two times a day (BID) | OROMUCOSAL | Status: DC
  Administered 2021-05-11 – 2021-05-17 (×9): 15 mL via OROMUCOSAL

## 2021-05-10 MED ORDER — ONDANSETRON HCL 4 MG/2ML IJ SOLN: 4.0000 mg | Freq: Four times a day (QID) | INTRAMUSCULAR | Status: DC | PRN

## 2021-05-10 MED ORDER — IPRATROPIUM-ALBUTEROL 0.5-2.5 (3) MG/3ML IN SOLN
3.0000 mL | Freq: Once | RESPIRATORY_TRACT | Status: AC
  Administered 2021-05-10: 3 mL via RESPIRATORY_TRACT
  Filled 2021-05-10: qty 3

## 2021-05-10 MED ORDER — CHLORHEXIDINE GLUCONATE CLOTH 2 % EX PADS
6.0000 | MEDICATED_PAD | Freq: Every day | CUTANEOUS | Status: DC
  Administered 2021-05-10 – 2021-05-13 (×3): 6 via TOPICAL

## 2021-05-10 MED ORDER — METHYLPREDNISOLONE SODIUM SUCC 125 MG IJ SOLR
125.0000 mg | Freq: Once | INTRAMUSCULAR | Status: AC
  Administered 2021-05-10: 125 mg via INTRAVENOUS
  Filled 2021-05-10: qty 2

## 2021-05-10 MED ORDER — METHYLPREDNISOLONE SODIUM SUCC 40 MG IJ SOLR
40.0000 mg | INTRAMUSCULAR | Status: DC
  Administered 2021-05-11 – 2021-05-12 (×2): 40 mg via INTRAVENOUS
  Filled 2021-05-10 (×2): qty 1

## 2021-05-10 MED ORDER — ENOXAPARIN SODIUM 80 MG/0.8ML IJ SOSY
0.5000 mg/kg | PREFILLED_SYRINGE | INTRAMUSCULAR | Status: DC
  Administered 2021-05-11: 75 mg via SUBCUTANEOUS
  Filled 2021-05-10 (×2): qty 0.8
  Filled 2021-05-10: qty 0.75

## 2021-05-10 MED ORDER — IPRATROPIUM-ALBUTEROL 0.5-2.5 (3) MG/3ML IN SOLN: 3.0000 mL | RESPIRATORY_TRACT | Status: DC | PRN

## 2021-05-10 MED ORDER — SODIUM CHLORIDE 0.9 % IV SOLN
500.0000 mg | INTRAVENOUS | Status: AC
  Administered 2021-05-11 – 2021-05-14 (×4): 500 mg via INTRAVENOUS
  Filled 2021-05-10 (×4): qty 500

## 2021-05-10 MED ORDER — CHLORHEXIDINE GLUCONATE 0.12 % MT SOLN
15.0000 mL | Freq: Two times a day (BID) | OROMUCOSAL | Status: DC
  Administered 2021-05-10 – 2021-05-17 (×14): 15 mL via OROMUCOSAL
  Filled 2021-05-10 (×12): qty 15

## 2021-05-10 MED ORDER — INSULIN ASPART 100 UNIT/ML IJ SOLN
0.0000 [IU] | INTRAMUSCULAR | Status: DC
Start: 2021-05-10 — End: 2021-05-17
  Administered 2021-05-10: 3 [IU] via SUBCUTANEOUS
  Administered 2021-05-10: 2 [IU] via SUBCUTANEOUS
  Administered 2021-05-11 (×3): 3 [IU] via SUBCUTANEOUS
  Administered 2021-05-11: 5 [IU] via SUBCUTANEOUS
  Administered 2021-05-11: 2 [IU] via SUBCUTANEOUS
  Administered 2021-05-11 – 2021-05-12 (×2): 3 [IU] via SUBCUTANEOUS
  Administered 2021-05-12: 2 [IU] via SUBCUTANEOUS
  Administered 2021-05-12: 3 [IU] via SUBCUTANEOUS
  Administered 2021-05-12: 5 [IU] via SUBCUTANEOUS
  Administered 2021-05-12: 3 [IU] via SUBCUTANEOUS
  Administered 2021-05-13: 2 [IU] via SUBCUTANEOUS
  Administered 2021-05-13: 3 [IU] via SUBCUTANEOUS
  Administered 2021-05-13: 2 [IU] via SUBCUTANEOUS
  Administered 2021-05-14: 3 [IU] via SUBCUTANEOUS
  Administered 2021-05-14 – 2021-05-15 (×3): 2 [IU] via SUBCUTANEOUS
  Administered 2021-05-15 – 2021-05-16 (×5): 3 [IU] via SUBCUTANEOUS
  Administered 2021-05-16 – 2021-05-17 (×4): 2 [IU] via SUBCUTANEOUS
  Filled 2021-05-10 (×27): qty 1

## 2021-05-10 MED ORDER — FAMOTIDINE IN NACL 20-0.9 MG/50ML-% IV SOLN
20.0000 mg | Freq: Two times a day (BID) | INTRAVENOUS | Status: DC
  Administered 2021-05-10 – 2021-05-13 (×7): 20 mg via INTRAVENOUS
  Filled 2021-05-10 (×7): qty 50

## 2021-05-10 MED ORDER — SODIUM CHLORIDE 0.9 % IV SOLN
500.0000 mg | Freq: Once | INTRAVENOUS | Status: AC
  Administered 2021-05-10: 500 mg via INTRAVENOUS
  Filled 2021-05-10: qty 500

## 2021-05-10 MED ORDER — BUDESONIDE 0.5 MG/2ML IN SUSP
0.5000 mg | Freq: Two times a day (BID) | RESPIRATORY_TRACT | Status: DC
  Administered 2021-05-11 – 2021-05-17 (×13): 0.5 mg via RESPIRATORY_TRACT
  Filled 2021-05-10 (×13): qty 2

## 2021-05-10 MED ORDER — IPRATROPIUM-ALBUTEROL 0.5-2.5 (3) MG/3ML IN SOLN
3.0000 mL | Freq: Four times a day (QID) | RESPIRATORY_TRACT | Status: DC
  Administered 2021-05-10 – 2021-05-17 (×28): 3 mL via RESPIRATORY_TRACT
  Filled 2021-05-10 (×27): qty 3

## 2021-05-10 NOTE — ED Provider Notes (Signed)
[facility] Emergency Department Provider Note  ____________________________________________   I have reviewed the triage vital signs and the nursing notes.   HISTORY  Chief Complaint Shortness of Breath (X 2hr)   History limited by and level 5 caveat due to: Shortness of breath   HPI Eddie Chapman is a 60 y.o. male who presents to the emergency department today because of concern for shortness of breath. Patient has a history of some chronic breathing difficulty and trach. This afternoon when the patient was on his oxygen he became acutely more short of breath. The patient does use a ventilator at night and family did try putting it on him this afternoon without any significant improvement. Family did state patient had a recent hospitalization for breathing issues and had been doing okay since discharge until today. No fevers.   Records reviewed. Per medical record review patient has a history of recent admission to the hospital for respiratory distress.  Past Medical History:  Diagnosis Date   Adenomatous colon polyp    Collapsed lung 08/2020   left   Depression    Ear drum perforation    left x2    GERD (gastroesophageal reflux disease)    Hyperlipidemia    Hypertension    Pneumonia    T8 vertebral fracture [facility])     Patient Active Problem List   Diagnosis Date Noted   Acute on chronic respiratory failure with hypercapnia ([place]) 04/25/2021   Depression    Atelectasis    Leukocytosis    Fever    Acute on chronic systolic congestive heart failure (HCC)    Acute respiratory failure with hypoxia ([place])    Palliative care by specialist    Respiratory failure with hypercapnia ([place]) 02/23/2021   Acute respiratory failure with hypoxia and hypercapnia ([place]) 10/07/2020   OSA (obstructive sleep apnea) 10/07/2020   Obesity hypoventilation syndrome ([place]) 37/90/2409   Acute diastolic CHF (congestive heart failure) ([place]) 10/07/2020   Obesity, Class III, BMI  40-49.9 (morbid obesity) ([place]) 10/07/2020   Occlusion and stenosis of vertebral artery 08/19/2016   Aneurysm, cerebral, nonruptured 08/19/2016   Essential hypertension 08/19/2016   Hyperlipidemia 08/19/2016    Past Surgical History:  Procedure Laterality Date   CARPAL TUNNEL RELEASE     left hand   COLONOSCOPY  09/18/2009, 09/15/2014   COLONOSCOPY WITH PROPOFOL N/A 12/29/2017   Procedure: COLONOSCOPY WITH PROPOFOL;  Surgeon: Manya Silvas, MD;  Location: [facility] ENDOSCOPY;  Service: Endoscopy;  Laterality: N/A;   EYE SURGERY     FRACTURE SURGERY     HERNIA REPAIR     LIPOMA EXCISION     SPINE SURGERY     TRACHEOSTOMY TUBE PLACEMENT N/A 03/07/2021   Procedure: TRACHEOSTOMY;  Surgeon: Margaretha Sheffield, MD;  Location: [facility] ORS;  Service: ENT;  Laterality: N/A;    Prior to Admission medications   Medication Sig Start Date End Date Taking? Authorizing Provider  ARIPiprazole (ABILIFY) 5 MG tablet Take 1 tablet (5 mg total) by mouth daily. 04/06/21   Bradly Bienenstock, NP  ascorbic acid (VITAMIN C) 250 MG tablet Take 1 tablet (250 mg total) by mouth 2 (two) times daily. 04/05/21   Bradly Bienenstock, NP  bisacodyl (DULCOLAX) 10 MG suppository Place 1 suppository (10 mg total) rectally daily as needed for moderate constipation. 04/05/21   Bradly Bienenstock, NP  Chlorhexidine Gluconate Cloth 2 % PADS Apply 6 each topically daily. 04/06/21   Bradly Bienenstock, NP  chlorhexidine gluconate, MEDLINE  KIT, (PERIDEX) 0.12 % solution 15 mLs by Mouth Rinse route 2 (two) times daily. 04/05/21   Bradly Bienenstock, NP  docusate (COLACE) 50 MG/5ML liquid Place 10 mLs (100 mg total) into feeding tube 2 (two) times daily. 04/05/21   Bradly Bienenstock, NP  famotidine (PEPCID) 20 MG tablet Take 1 tablet (20 mg total) by mouth 2 (two) times daily. 05/04/21 06/03/21  Max Sane, MD  feeding supplement (ENSURE ENLIVE / ENSURE PLUS) LIQD Take 237 mLs by mouth 3 (three) times daily between meals. 04/05/21   Bradly Bienenstock, NP   furosemide (LASIX) 40 MG tablet Take 1 tablet (40 mg total) by mouth 2 (two) times daily. 05/04/21 06/03/21  Max Sane, MD  ipratropium-albuterol (DUONEB) 0.5-2.5 (3) MG/3ML SOLN Take 3 mLs by nebulization 3 (three) times daily. 04/05/21   Bradly Bienenstock, NP  metoprolol tartrate (LOPRESSOR) 50 MG tablet Take 1 tablet (50 mg total) by mouth 2 (two) times daily. 05/04/21 06/03/21  Max Sane, MD  oxyCODONE 10 MG TABS Place 1 tablet (10 mg total) into feeding tube every 6 (six) hours as needed for moderate pain. 04/05/21   Bradly Bienenstock, NP  polyethylene glycol (MIRALAX / GLYCOLAX) 17 g packet Take 17 g by mouth daily. 04/06/21   Bradly Bienenstock, NP  polyvinyl alcohol (LIQUIFILM TEARS) 1.4 % ophthalmic solution Place 1 drop into both eyes as needed for dry eyes. 04/05/21   Bradly Bienenstock, NP  senna-docusate (SENOKOT-S) 8.6-50 MG tablet Take 1 tablet by mouth 2 (two) times daily. 04/05/21   Bradly Bienenstock, NP    Allergies Amlodipine and Divalproex sodium  Family History  Problem Relation Age of Onset   Cancer Mother    Varicose Veins Mother     Social History Social History   Tobacco Use   Smoking status: Never   Smokeless tobacco: Never  Vaping Use   Vaping Use: Never used  Substance Use Topics   Alcohol use: Never   Drug use: Never    Review of Systems Constitutional: No fever/chills Eyes: No visual changes. ENT: No sore throat. Cardiovascular: Denies chest pain. Respiratory: Positive for shortness of breath. Gastrointestinal: No abdominal pain.  No nausea, no vomiting.  No diarrhea.   Genitourinary: Negative for dysuria. Musculoskeletal: Negative for back pain. Skin: Negative for rash. Neurological: Negative for headaches, focal weakness or numbness.  ____________________________________________   PHYSICAL EXAM:  VITAL SIGNS: ED Triage Vitals  Enc Vitals Group     BP 05/10/21 1610 118/60     Pulse Rate 05/10/21 1610 80     Resp 05/10/21 1610 (!) 35     Temp  05/10/21 1610 98.4 F (36.9 C)     Temp src --      SpO2 05/10/21 1610 97 %     Weight 05/10/21 1607 (!) 330 lb 8 oz (149.9 kg)     Height 05/10/21 1607 '5\' 10"'  (1.778 m)     Head Circumference --      Peak Flow --      Pain Score 05/10/21 1607 8   Constitutional: Alert and oriented. Respiratory distress. Eyes: Conjunctivae are normal.  ENT      Head: Normocephalic and atraumatic.      Nose: No congestion/rhinnorhea.      Mouth/Throat: Mucous membranes are moist.      Neck: No stridor. Hematological/Lymphatic/Immunilogical: No cervical lymphadenopathy. Cardiovascular: Normal rate.  Respiratory: Tachypnea. Diffuse rhocnhi/wheezing.  Gastrointestinal: Soft and non tender. No rebound. No guarding.  Genitourinary: Deferred Musculoskeletal: Normal range of motion in all extremities. No lower extremity edema. Neurologic:  Normal speech and language. No gross focal neurologic deficits are appreciated.  Skin:  Skin is warm, dry and intact. No rash noted. Psychiatric: Mood and affect are normal. Speech and behavior are normal. Patient exhibits appropriate insight and judgment.  ____________________________________________    LABS (pertinent positives/negatives)  ABG pH 7.31, pCO2 103 Trop hs 11 BNP 32.3 COVID negative BMP wnl except cl 91, co2 42, glu 154 CBC wbc 11.0, hgb 13.6, plt 179  ____________________________________________    RADIOLOGY  CXR Possible small left pleural effusion.  ____________________________________________   PROCEDURES  Procedures  CRITICAL CARE Performed by: Nance Pear   Total critical care time: 30 minutes  Critical care time was exclusive of separately billable procedures and treating other patients.  Critical care was necessary to treat or prevent imminent or life-threatening deterioration.  Critical care was time spent personally by me on the following activities: development of treatment plan with patient and/or surrogate as  well as nursing, discussions with consultants, evaluation of patient's response to treatment, examination of patient, obtaining history from patient or surrogate, ordering and performing treatments and interventions, ordering and review of laboratory studies, ordering and review of radiographic studies, pulse oximetry and re-evaluation of patient's condition.  ____________________________________________   INITIAL IMPRESSION / ASSESSMENT AND PLAN / ED COURSE  Pertinent labs & imaging results that were available during my care of the patient were reviewed by me and considered in my medical decision making (see chart for details).   Patient presented to the emergency department today because of concerns for respiratory distress.  On exam patient was in respiratory distress with significant tachypnea.  She does have significant history of breathing issues and does have a trach in place.  ABG is concerning for significant hypercapnia.  Because of this he was placed on a vent.  He did seem to clinically improve after being placed on the vent.  Patient will be given treatment for COPD.  Will plan on admission to the hospital service.  Discussed this with patient. ____________________________________________   FINAL CLINICAL IMPRESSION(S) / ED DIAGNOSES  Final diagnoses:  Acute respiratory failure with hypoxia and hypercarbia ([place])     Note: This dictation was prepared with Dragon dictation. Any transcriptional errors that result from this process are unintentional     Nance Pear, MD 05/10/21 1856

## 2021-05-10 NOTE — Progress Notes (Signed)
PHARMACIST - PHYSICIAN COMMUNICATION  CONCERNING:  Enoxaparin (Lovenox) for DVT Prophylaxis    RECOMMENDATION: Patient was prescribed enoxaprin '40mg'$  q24 hours for VTE prophylaxis.   Filed Weights   05/10/21 1607  Weight: (!) 149.9 kg (330 lb 8 oz)    Body mass index is 47.42 kg/m.  Estimated Creatinine Clearance: 135.6 mL/min (by C-G formula based on SCr of 0.84 mg/dL).   Based on [place] patient is candidate for enoxaparin 0.'5mg'$ /kg TBW SQ every 24 hours based on BMI being >30.  DESCRIPTION: Pharmacy has adjusted enoxaparin dose per [facility] policy.  Patient is now receiving enoxaparin 75 mg every 24 hours    Dorothe Pea, PharmD, BCPS Clinical Pharmacist  05/10/2021 7:19 PM

## 2021-05-10 NOTE — Consult Note (Signed)
PHARMACY CONSULT NOTE - FOLLOW UP  Pharmacy Consult for Electrolyte Monitoring and Replacement   Recent Labs: Potassium (mmol/L)  Date Value  05/10/2021 4.6  05/04/2014 4.3   Magnesium (mg/dL)  Date Value  04/30/2021 2.1   Calcium (mg/dL)  Date Value  05/10/2021 8.9   Calcium, Total (mg/dL)  Date Value  05/04/2014 9.1   Albumin (g/dL)  Date Value  04/25/2021 3.4 (L)   Phosphorus (mg/dL)  Date Value  04/28/2021 4.1   Sodium (mmol/L)  Date Value  05/10/2021 141  05/04/2014 140     Assessment: 60 y.o. male who presents to the emergency department today because of concern for shortness of breath. Hx of HFpEF, COPD,tracheostomy placed 02/2021. Patient emergently intubated in ED and admitted to ICU. Pharmacy has been consulted to monitor and replace electrolytes as needed.   Goal of Therapy:  Electrolytes WNL  Plan:  No additional electrolyte replacement needed at this time Follow up with BMP, Mag, Phos with AM labs  Dorothe Pea ,PharmD, BCPS Clinical Pharmacist 05/10/2021 7:31 PM

## 2021-05-10 NOTE — H&P (Signed)
NAME:  Eddie Chapman, MRN:  [id number], DOB:  1961/05/26, LOS: 0 ADMISSION DATE:  05/10/2021, CONSULTATION DATE:  05/10/21 REFERRING MD:  Dr. Archie Balboa, CHIEF COMPLAINT: Shortness of breath  Brief Pt Description / Synopsis:  60 year old male tracheostomy dependent with past medical history significant for COPD, obesity hypoventilation syndrome, and HFpEF admitted with acute on chronic hypoxic and hypercapnic respiratory failure in setting of AECOPD requiring mechanical ventilation.  History of Present Illness:  cire clute is a 60 year old male with a past medical history significant for tracheostomy dependence due to chronic hypoxic hypercapnic respiratory failure, COPD, obesity hypoventilation syndrome, and HFpEF who presents to [facility] ED on 05/10/2021 due to complaints of shortness of breath.  Per patient and spouse's report, he became more acutely short of breath this afternoon approximately 2 hours before presentation.  They tried placing him on his nightly ventilator without any significant improvement in respiratory status.  He does report associated wheezing.  He denies cough, sputum production, fever, chills, chest pain, palpitations, nausea, vomiting, diarrhea, abdominal pain, dysuria, or sick contacts.  Of note he was recently admitted at [facility] from 04/25/2021 to 05/04/2021 for treatment of acute on chronic hypoxic and hypercapnic respiratory failure due to AE COPD, obesity hypoventilation syndrome and underlying chronic diastolic heart failure.  ED Course: Upon arrival to the ED he was noted to be in respiratory distress with significant tachypnea. On exam he exhibited diffuse rhonchi/wheezing.  ABG was concerning for significant hypercapnia and hypoxia.  He was subsequently placed on mechanical ventilation with noted improvement in respiratory status.  He was also given 125 mg of IV Solu-Medrol, bronchodilators, and azithromycin.  Initial vital signs: Blood pressure 118/60, pulse 30, respiratory  rate 35, temperature 98.4, SPO2 97% Labs: Bicarbonate 42, glucose 154, BNP 32.3, high-sensitivity troponin 11, WBC 11.0 with neutrophilia, COVID-19 PCR negative ABG: pH 7.31/PCO2 103/PO2 74/HCO3 51.9 Imaging: Chest x-ray: Stable cardiomegaly. Tracheostomy tube is unchanged in position. Old left rib fractures are noted. No pneumothorax is noted. Possible small left pleural effusion is noted. Right lung is clear.  PCCM is asked to admit the patient to ICU for further work-up and treatment of acute on chronic hypoxic and hypercapnic respiratory failure in setting of AECOPD requiring mechanical ventilation.   Pertinent  Medical History  COPD Obesity hypoventilation syndrome Pneumonia HFpEF Hypertension Hyperlipidemia GERD T8 vertebral fracture  Micro Data:  05/10/2021: SARS-CoV-2 and influenza PCR>> negative 05/10/2021: Tracheal aspirate>>  Antimicrobials:  Azithromycin 8/11>>  Significant Hospital Events: Including procedures, antibiotic start and stop dates in addition to other pertinent events   05/10/2021: Presented to ED with shortness of breath, tracheostomy connected to ventilator in ED, PCCM asked to admit  Interim History / Subjective:  -Presented to ED with respiratory distress -Placed on mechanical ventilation via tracheostomy -Work of breathing improved, does report shortness of breath and wheezing -Denies cough or sputum production, fever or chills  Objective   Blood pressure (!) 118/105, pulse 79, temperature 98.4 F (36.9 C), resp. rate 19, height '5\' 10"'  (1.778 m), weight (!) 149.9 kg, SpO2 98 %.    Vent Mode: PCV FiO2 (%):  [40 %] 40 % Set Rate:  [24 bmp] 24 bmp PEEP:  [5 cmH20] 5 cmH20 Plateau Pressure:  [25 cmH20] 25 cmH20  No intake or output data in the 24 hours ending 05/10/21 1842 Filed Weights   05/10/21 1607  Weight: (!) 149.9 kg    Examination: General: Acute on chronically ill-appearing trach dependent male, laying in bed, tracheostomy connected  to  ventilator, with mild respiratory distress HENT: Atraumatic, normocephalic, neck supple, no JVD Lungs: Coarse breath sounds bilaterally, no rales noted, synchronous with the vent, even Cardiovascular: Regular rate and rhythm, S1-S2, no murmurs, rubs, gallops, 2+ distal pulses Abdomen: Obese, soft, nontender, no guarding or rebound tenderness, bowel sounds positive x4 Extremities: Normal bulk and tone, no deformities Neuro: Lethargic, arouses easily to voice and moves all extremities/follows commands, no focal deficits GU: Deferred Skin: Warm and dry.  Limited exam due to respiratory status.  No obvious rashes, lesions, ulcerations  Resolved Hospital Problem list     Assessment & Plan:   Acute on Chronic Hypoxic & Hypercapnic Respiratory Failure in the setting of AECOPD PMHx of Obesity Hypoventilation Syndrome, Chronic Tracheostomy -Full vent support, implement lung protective strategies -Wean FiO2 & PEEP as tolerated to maintain O2 sats >92% -Follow intermittent Chest X-ray & ABG as needed -Spontaneous Breathing Trials/Trach collar trials when respiratory parameters met and mental status permits -Implement VAP Bundle -Scheduled & Prn Bronchodilators -IV Solumedrol & Budesonide nebs -Azithromycin for severe COPD Exacerbation  Chronic HFpEF without acute exacerbation -Continuous cardiac monitoring -Maintain MAP >65 -BNP 32.3 -HS Troponin negative x1 (11) -Continue home Lasix as BP and renal function permits -Hold home Metoprolol as BP is soft  Mild Leukocytosis, ? Pneumonia -Monitor fever curve -Trend WBC's & Procalcitonin -Follow cultures as above -Continue empiric Azithromycin pending cultures & sensitivities -Chest X-ray on 05/10/21 without obvious pneumonia ~ will follow serial CXR's -Urinalysis is pending  Hyperglycemia -CBG's -SSI -Follow ICU Hypo/Hyperglycemia protocol  GERD -IV Pepcid BID   Best Practice (right click and "Reselect all SmartList Selections"  daily)   Diet/type: NPO, will need speech evaluation once weaned from vent DVT prophylaxis: LMWH GI prophylaxis: H2B Lines: N/A Foley:  N/A Code Status:  full code Last date of multidisciplinary goals of care discussion [N/A]  Updated pt and spouse in ED at bedside 05/10/21.  All questions answered.  Labs   CBC: Recent Labs  Lab 05/04/21 0611 05/10/21 1515  WBC 9.0 11.0*  NEUTROABS  --  8.7*  HGB 13.2 13.6  HCT 41.0 42.9  MCV 102.2* 102.6*  PLT 163 240    Basic Metabolic Panel: Recent Labs  Lab 05/04/21 0611 05/10/21 1515  NA 140 141  K 4.6 4.6  CL 94* 91*  CO2 39* 42*  GLUCOSE 140* 154*  BUN 22 19  CREATININE 0.83 0.84  CALCIUM 9.1 8.9   GFR: Estimated Creatinine Clearance: 135.6 mL/min (by C-G formula based on SCr of 0.84 mg/dL). Recent Labs  Lab 05/04/21 0611 05/10/21 1515  PROCALCITON <0.10  --   WBC 9.0 11.0*    Liver Function Tests: No results for input(s): AST, ALT, ALKPHOS, BILITOT, PROT, ALBUMIN in the last 168 hours. No results for input(s): LIPASE, AMYLASE in the last 168 hours. No results for input(s): AMMONIA in the last 168 hours.  ABG    Component Value Date/Time   PHART 7.31 (L) 05/10/2021 1637   PCO2ART 103 (HH) 05/10/2021 1637   PO2ART 74 (L) 05/10/2021 1637   HCO3 51.9 (H) 05/10/2021 1637   O2SAT 93.3 05/10/2021 1637     Coagulation Profile: No results for input(s): INR, PROTIME in the last 168 hours.  Cardiac Enzymes: No results for input(s): CKTOTAL, CKMB, CKMBINDEX, TROPONINI in the last 168 hours.  HbA1C: Hgb A1c MFr Bld  Date/Time Value Ref Range Status  04/29/2021 05:02 AM 5.6 4.8 - 5.6 % Final    Comment:    (NOTE) Pre diabetes:  5.7%-6.4%  Diabetes:              >6.4%  Glycemic control for   <7.0% adults with diabetes   03/02/2021 04:52 AM 5.9 (H) 4.8 - 5.6 % Final    Comment:    (NOTE)         Prediabetes: 5.7 - 6.4         Diabetes: >6.4         Glycemic control for adults with diabetes:  <7.0     CBG: Recent Labs  Lab 05/03/21 2110 05/04/21 0731 05/04/21 1128  GLUCAP 186* 121* 264*    Review of Systems:   Positives in BOLD: Gen: Denies fever, chills, weight change, fatigue, night sweats HEENT: Denies blurred vision, double vision, hearing loss, tinnitus, sinus congestion, rhinorrhea, sore throat, neck stiffness, dysphagia PULM: Denies shortness of breath, cough, sputum production, hemoptysis, wheezing CV: Denies chest pain, edema, orthopnea, paroxysmal nocturnal dyspnea, palpitations GI: Denies abdominal pain, nausea, vomiting, diarrhea, hematochezia, melena, constipation, change in bowel habits GU: Denies dysuria, hematuria, polyuria, oliguria, urethral discharge Endocrine: Denies hot or cold intolerance, polyuria, polyphagia or appetite change Derm: Denies rash, dry skin, scaling or peeling skin change Heme: Denies easy bruising, bleeding, bleeding gums Neuro: Denies headache, numbness, weakness, slurred speech, loss of memory or consciousness   Past Medical History:  He,  has a past medical history of Adenomatous colon polyp, Collapsed lung (08/2020), Depression, Ear drum perforation, GERD (gastroesophageal reflux disease), Hyperlipidemia, Hypertension, Pneumonia, and T8 vertebral fracture ([place]).   Surgical History:   Past Surgical History:  Procedure Laterality Date   CARPAL TUNNEL RELEASE     left hand   COLONOSCOPY  09/18/2009, 09/15/2014   COLONOSCOPY WITH PROPOFOL N/A 12/29/2017   Procedure: COLONOSCOPY WITH PROPOFOL;  Surgeon: Manya Silvas, MD;  Location: [facility] ENDOSCOPY;  Service: Endoscopy;  Laterality: N/A;   EYE SURGERY     FRACTURE SURGERY     HERNIA REPAIR     LIPOMA EXCISION     SPINE SURGERY     TRACHEOSTOMY TUBE PLACEMENT N/A 03/07/2021   Procedure: TRACHEOSTOMY;  Surgeon: Margaretha Sheffield, MD;  Location: [facility] ORS;  Service: ENT;  Laterality: N/A;     Social History:   reports that he has never smoked. He has never used smokeless  tobacco. He reports that he does not drink alcohol and does not use drugs.   Family History:  His family history includes Cancer in his mother; Varicose Veins in his mother.   Allergies Allergies  Allergen Reactions   Amlodipine Swelling    Ankle swelling    Divalproex Sodium Other (See Comments)    Elevated liver enzymes     Home Medications  Prior to Admission medications   Medication Sig Start Date End Date Taking? Authorizing Provider  ARIPiprazole (ABILIFY) 5 MG tablet Take 1 tablet (5 mg total) by mouth daily. 04/06/21   Bradly Bienenstock, NP  ascorbic acid (VITAMIN C) 250 MG tablet Take 1 tablet (250 mg total) by mouth 2 (two) times daily. 04/05/21   Bradly Bienenstock, NP  bisacodyl (DULCOLAX) 10 MG suppository Place 1 suppository (10 mg total) rectally daily as needed for moderate constipation. 04/05/21   Bradly Bienenstock, NP  Chlorhexidine Gluconate Cloth 2 % PADS Apply 6 each topically daily. 04/06/21   Bradly Bienenstock, NP  chlorhexidine gluconate, MEDLINE KIT, (PERIDEX) 0.12 % solution 15 mLs by Mouth Rinse route 2 (two) times daily. 04/05/21   Bradly Bienenstock, NP  docusate (COLACE) 50 MG/5ML liquid Place 10 mLs (100 mg total) into feeding tube 2 (two) times daily. 04/05/21   Bradly Bienenstock, NP  famotidine (PEPCID) 20 MG tablet Take 1 tablet (20 mg total) by mouth 2 (two) times daily. 05/04/21 06/03/21  Max Sane, MD  feeding supplement (ENSURE ENLIVE / ENSURE PLUS) LIQD Take 237 mLs by mouth 3 (three) times daily between meals. 04/05/21   Bradly Bienenstock, NP  furosemide (LASIX) 40 MG tablet Take 1 tablet (40 mg total) by mouth 2 (two) times daily. 05/04/21 06/03/21  Max Sane, MD  ipratropium-albuterol (DUONEB) 0.5-2.5 (3) MG/3ML SOLN Take 3 mLs by nebulization 3 (three) times daily. 04/05/21   Bradly Bienenstock, NP  metoprolol tartrate (LOPRESSOR) 50 MG tablet Take 1 tablet (50 mg total) by mouth 2 (two) times daily. 05/04/21 06/03/21  Max Sane, MD  oxyCODONE 10 MG TABS Place 1 tablet  (10 mg total) into feeding tube every 6 (six) hours as needed for moderate pain. 04/05/21   Bradly Bienenstock, NP  polyethylene glycol (MIRALAX / GLYCOLAX) 17 g packet Take 17 g by mouth daily. 04/06/21   Bradly Bienenstock, NP  polyvinyl alcohol (LIQUIFILM TEARS) 1.4 % ophthalmic solution Place 1 drop into both eyes as needed for dry eyes. 04/05/21   Bradly Bienenstock, NP  senna-docusate (SENOKOT-S) 8.6-50 MG tablet Take 1 tablet by mouth 2 (two) times daily. 04/05/21   Bradly Bienenstock, NP     Critical care time: 50 minutes     Darel Hong, AGACNP-BC [facility] Prefer epic messenger for cross cover needs If after hours, please call E-link

## 2021-05-10 NOTE — ED Triage Notes (Signed)
Pt from home, SOB x 2 hr. Hx of currently collapsed L lung, pneumonia (Both since 12/21), CHF, trach placed in June 22. Pt was SOB when medic arrived, on 4L [place] chronically

## 2021-05-10 NOTE — ED Notes (Signed)
RT called at this time to request them at bedside d/t pt coughing and report of "struggling" with the vent

## 2021-05-11 ENCOUNTER — Inpatient Hospital Stay: Payer: PPO

## 2021-05-11 DIAGNOSIS — J9601 Acute respiratory failure with hypoxia: Secondary | ICD-10-CM

## 2021-05-11 DIAGNOSIS — J9622 Acute and chronic respiratory failure with hypercapnia: Secondary | ICD-10-CM | POA: Diagnosis not present

## 2021-05-11 DIAGNOSIS — J9621 Acute and chronic respiratory failure with hypoxia: Secondary | ICD-10-CM | POA: Diagnosis not present

## 2021-05-11 DIAGNOSIS — J9602 Acute respiratory failure with hypercapnia: Secondary | ICD-10-CM

## 2021-05-11 LAB — MAGNESIUM: Magnesium: 2 mg/dL (ref 1.7–2.4)

## 2021-05-11 LAB — BLOOD GAS, ARTERIAL
Acid-Base Excess: 15.8 mmol/L — ABNORMAL HIGH (ref 0.0–2.0)
Bicarbonate: 40.2 mmol/L — ABNORMAL HIGH (ref 20.0–28.0)
FIO2: 0.4
O2 Saturation: 94.9 %
PEEP: 5 cmH2O
Patient temperature: 37
Pressure control: 35 cmH2O
RATE: 18 resp/min
pCO2 arterial: 46 mmHg (ref 32.0–48.0)
pH, Arterial: 7.55 — ABNORMAL HIGH (ref 7.350–7.450)
pO2, Arterial: 65 mmHg — ABNORMAL LOW (ref 83.0–108.0)

## 2021-05-11 LAB — CBC
HCT: 38.4 % — ABNORMAL LOW (ref 39.0–52.0)
Hemoglobin: 12.6 g/dL — ABNORMAL LOW (ref 13.0–17.0)
MCH: 32.4 pg (ref 26.0–34.0)
MCHC: 32.8 g/dL (ref 30.0–36.0)
MCV: 98.7 fL (ref 80.0–100.0)
Platelets: 176 10*3/uL (ref 150–400)
RBC: 3.89 MIL/uL — ABNORMAL LOW (ref 4.22–5.81)
RDW: 13.4 % (ref 11.5–15.5)
WBC: 9.1 10*3/uL (ref 4.0–10.5)
nRBC: 0 % (ref 0.0–0.2)

## 2021-05-11 LAB — GLUCOSE, CAPILLARY
Glucose-Capillary: 166 mg/dL — ABNORMAL HIGH (ref 70–99)
Glucose-Capillary: 138 mg/dL — ABNORMAL HIGH (ref 70–99)
Glucose-Capillary: 152 mg/dL — ABNORMAL HIGH (ref 70–99)
Glucose-Capillary: 227 mg/dL — ABNORMAL HIGH (ref 70–99)
Glucose-Capillary: 179 mg/dL — ABNORMAL HIGH (ref 70–99)
Glucose-Capillary: 177 mg/dL — ABNORMAL HIGH (ref 70–99)

## 2021-05-11 LAB — BASIC METABOLIC PANEL WITH GFR
Anion gap: 12 (ref 5–15)
BUN: 30 mg/dL — ABNORMAL HIGH (ref 8–23)
CO2: 34 mmol/L — ABNORMAL HIGH (ref 22–32)
Calcium: 9 mg/dL (ref 8.9–10.3)
Chloride: 93 mmol/L — ABNORMAL LOW (ref 98–111)
Creatinine, Ser: 1.37 mg/dL — ABNORMAL HIGH (ref 0.61–1.24)
GFR, Estimated: 59 mL/min — ABNORMAL LOW
Glucose, Bld: 184 mg/dL — ABNORMAL HIGH (ref 70–99)
Potassium: 4.6 mmol/L (ref 3.5–5.1)
Sodium: 139 mmol/L (ref 135–145)

## 2021-05-11 LAB — PHOSPHORUS: Phosphorus: 1.3 mg/dL — ABNORMAL LOW (ref 2.5–4.6)

## 2021-05-11 LAB — BRAIN NATRIURETIC PEPTIDE: B Natriuretic Peptide: 24 pg/mL (ref 0.0–100.0)

## 2021-05-11 MED ORDER — OXYCODONE HCL 5 MG PO TABS
10.0000 mg | ORAL_TABLET | ORAL | Status: DC | PRN
  Administered 2021-05-11 – 2021-05-17 (×14): 10 mg via ORAL
  Filled 2021-05-11 (×14): qty 2

## 2021-05-11 MED ORDER — SERTRALINE HCL 50 MG PO TABS
25.0000 mg | ORAL_TABLET | Freq: Every day | ORAL | Status: DC
  Administered 2021-05-11 – 2021-05-17 (×7): 25 mg via ORAL
  Filled 2021-05-11 (×7): qty 1

## 2021-05-11 MED ORDER — ACETAMINOPHEN 325 MG PO TABS: 650.0000 mg | ORAL_TABLET | Freq: Four times a day (QID) | ORAL | Status: DC | PRN

## 2021-05-11 MED ORDER — SODIUM PHOSPHATES 45 MMOLE/15ML IV SOLN
30.0000 mmol | Freq: Once | INTRAVENOUS | Status: AC
  Administered 2021-05-11: 30 mmol via INTRAVENOUS
  Filled 2021-05-11: qty 10

## 2021-05-11 NOTE — Plan of Care (Signed)
Patient may require SNF if home vent issues are not resolved.  Per DME rep the patient's log on the Trilogy ventilator shows very little use at home.  Arterial blood gases on admission showed a bicarb of 50 calculated, this shows that the patient has hypercarbic for quite a while.  The patient has stated very clearly wishes to be DNR.  He is end-stage and at this point a CODE BLUE situation would result in devastating consequences.  CODE STATUS has been documented previously.  Out of facility DNR order.  We will proceed with resuming status.   Renold Don, MD Advanced Bronchoscopy PCCM [facility]

## 2021-05-11 NOTE — Progress Notes (Signed)
Patient blames girlfriend for this admission. States girlfriend did not know how to use home ventilator. Stated she did not attach the home ventilator correctly or deflate the cuff etc. Case manager T.Maxey SW stated Zack (Home health rep.) reported the home vent.has very few hours on it . Patient was not using home vent. When discussed with patient, he did not admit he was not using home vent. Patient has been very demanding about diet. Demanding to get food before MD gave the order.

## 2021-05-11 NOTE — Evaluation (Signed)
Physical Therapy Evaluation Patient Details Name: Eddie Chapman MRN: [id number] DOB: [birth year] Today's Date: 05/11/2021   History of Present Illness  presented to ER secondary top progressive SOB; admitted for management of acute/chronic hypoxic hypercarbic respiratory failure due to AECOPD (trach dependant).  Of note, recently admitted at [facility] from 04/25/2021 to 05/04/2021 for similar diagnosis/course; appears patient/significant other may be unable to consistently utilize home vent system.  Clinical Impression  Patient resting in bed upon arrival to session; RN at bedside for care.  Patient alert and oriented to basic information; follows simple commands and agreeable to participation with session.  Generally impulsive with movement efforts, but responds well to redirection and cuing. Functionally weak with limited muscular endurance noted throughout all extremities, but no focal weakness appreciated.  Able to complete bed mobility with sup/mod indep (assist for line management); sit/stand, basic transfers and short-distance gait (5') with Rw, cga/min assist +2 for safety.  Demonstrates broad BOS, limited heel strike/toe off; mod WBing bilat UEs; mod SOB with exertion, HR to 120s (asymptomatic).  Additional gait/activity deferred per RN request; will continue to assess/progress in subsequent sessions as appropriate. Would benefit from skilled PT to address above deficits and promote optimal return to PLOF.; recommend transition to STR upon discharge from acute hospitalization.  May benefit from transition to LTC upon completion of STR if patient/caregivers unable to manage respiratory needs (and trach/vent care) in home environment.     Follow Up Recommendations SNF    Equipment Recommendations       Recommendations for Other Services       Precautions / Restrictions Precautions Precautions: Fall Precaution Comments: trach collar, PMV Restrictions Weight Bearing Restrictions: No       Mobility  Bed Mobility Overal bed mobility: Modified Independent Bed Mobility: Supine to Sit                Transfers Overall transfer level: Needs assistance Equipment used: Rolling walker (2 wheeled) Transfers: Sit to/from Stand Sit to Stand: Min assist;+2 safety/equipment         General transfer comment: use of momentum to initiate lift off  Ambulation/Gait Ambulation/Gait assistance: Min guard;Min assist;+2 safety/equipment Gait Distance (Feet): 5 Feet Assistive device: Rolling walker (2 wheeled)       General Gait Details: broad BOS, limited heel strike/toe off; mod WBing bilat UEs; mod SOB with exertion, HR to 120s (asymptomatic)  Stairs            Wheelchair Mobility    Modified Rankin (Stroke Patients Only)       Balance Overall balance assessment: Needs assistance Sitting-balance support: No upper extremity supported;Feet supported Sitting balance-Leahy Scale: Good     Standing balance support: Bilateral upper extremity supported Standing balance-Leahy Scale: Fair                               Pertinent Vitals/Pain Pain Assessment: No/denies pain    Home Living Family/patient expects to be discharged to:: Private residence Living Arrangements: Spouse/significant other Available Help at Discharge: Family;Available 24 hours/day Type of Home: House Home Access: Stairs to enter Entrance Stairs-Rails: None Entrance Stairs-Number of Steps: 2 Home Layout: One level Home Equipment: Walker - 2 wheels;Bedside commode;Other (comment);Grab bars - tub/shower;Grab bars - toilet;Hospital bed      Prior Function Level of Independence: Needs assistance   Gait / Transfers Assistance Needed: rollator for gait in home PRN, SPC for community use PRN, pt is able to  ambulate around small store (ex: [employer]) but not larger store (ex: [employer]) 2/2 chronic heart issues  ADL's / Homemaking Assistance Needed: Per SO, pt requires occsional  assistance for LB access during ADL management.  Comments: Per patient, during time at home (between hospitalizations), was ambulatory with RW for limited household distances; does endorse fall getting in/out of home at last discharge (EMS assisted recovery)     Hand Dominance   Dominant Hand: Right    Extremity/Trunk Assessment   Upper Extremity Assessment Upper Extremity Assessment: Overall WFL for tasks assessed    Lower Extremity Assessment Lower Extremity Assessment: Generalized weakness (grossly 4-/5 throughout; supports body weight in standing without buckling. No focal weakness appreciated)       Communication   Communication: Tracheostomy (able to generate voice over trach (no PMV/cap utilized))  Cognition Arousal/Alertness: Awake/alert Behavior During Therapy: WFL for tasks assessed/performed Overall Cognitive Status: Within Functional Limits for tasks assessed                                 General Comments: Alert and oriented to basic information; generally impulsive with movement, but redirectable with cuing      General Comments      Exercises     Assessment/Plan    PT Assessment Patient needs continued PT services  PT Problem List Decreased strength;Decreased balance;Decreased knowledge of precautions;Obesity;Cardiopulmonary status limiting activity;Decreased knowledge of use of DME;Decreased mobility;Decreased activity tolerance;Decreased coordination;Decreased range of motion;Decreased safety awareness       PT Treatment Interventions DME instruction;Functional mobility training;Balance training;Patient/family education;Modalities;Gait training;Therapeutic activities;Neuromuscular re-education;Radiographer, therapeutic;Therapeutic exercise;Cognitive remediation;Manual techniques    PT Goals (Current goals can be found in the Care Plan section)  Acute Rehab PT Goals Patient Stated Goal: to return home PT Goal  Formulation: With patient Time For Goal Achievement: 05/25/21 Potential to Achieve Goals: Fair    Frequency Min 2X/week   Barriers to discharge        Co-evaluation   Reason for Co-Treatment: Complexity of the patient's impairments (multi-system involvement);For patient/therapist safety PT goals addressed during session: Mobility/safety with mobility OT goals addressed during session: ADL's and self-care       AM-PAC PT "6 Clicks" Mobility  Outcome Measure Help needed turning from your back to your side while in a flat bed without using bedrails?: None Help needed moving from lying on your back to sitting on the side of a flat bed without using bedrails?: None Help needed moving to and from a bed to a chair (including a wheelchair)?: A Little Help needed standing up from a chair using your arms (e.g., wheelchair or bedside chair)?: A Little Help needed to walk in hospital room?: A Little Help needed climbing 3-5 steps with a railing? : A Lot 6 Click Score: 19    End of Session Equipment Utilized During Treatment: Oxygen Activity Tolerance: Patient tolerated treatment well Patient left: in chair;with call bell/phone within reach;with nursing/sitter in room Nurse Communication: Mobility status PT Visit Diagnosis: Muscle weakness (generalized) (M62.81);Difficulty in walking, not elsewhere classified (R26.2);Unsteadiness on feet (R26.81)    Time: 1359-1420 PT Time Calculation (min) (ACUTE ONLY): 21 min   Charges:   PT Evaluation $PT Eval Moderate Complexity: 1 Mod         Aleksia Freiman H. Owens Shark, PT, DPT, NCS 05/11/21, 4:47 PM [phone]

## 2021-05-11 NOTE — Progress Notes (Addendum)
Patient remained in Sinus Tach, c/o of chronic back pain, relieved with pain medications and repositioning. Patient can reposition self and is a 1-assist with transfer. Encouraged patient to change positions and use pillow support to relieve pressure off wound on back.  Patient was a full code, consulted and switched to DNR. The patient then stated he was confused about his DNR status and wanted to be a full code. MD notified and order switched. Girlfriend brought patients Passy- Muir valve in, it is currently in place.

## 2021-05-11 NOTE — Consult Note (Signed)
PHARMACY CONSULT NOTE - FOLLOW UP  Pharmacy Consult for Electrolyte Monitoring and Replacement   Recent Labs: Potassium (mmol/L)  Date Value  05/11/2021 4.6  05/04/2014 4.3   Magnesium (mg/dL)  Date Value  05/11/2021 2.0   Calcium (mg/dL)  Date Value  05/11/2021 9.0   Calcium, Total (mg/dL)  Date Value  05/04/2014 9.1   Albumin (g/dL)  Date Value  04/25/2021 3.4 (L)   Phosphorus (mg/dL)  Date Value  05/11/2021 1.3 (L)   Sodium (mmol/L)  Date Value  05/11/2021 139  05/04/2014 140     Assessment: 60 y.o. male who presents to the emergency department today because of concern for shortness of breath. Hx of HFpEF, COPD,tracheostomy placed 02/2021. Pharmacy has been consulted to monitor and replace electrolytes as needed.   Goal of Therapy:  Electrolytes WNL  Plan:  Sodium phos 30 mmol IV Follow up with BMP, Mag, Phos with AM labs  Tawnya Crook ,PharmD, BCPS Clinical Pharmacist 05/11/2021 2:18 PM

## 2021-05-11 NOTE — Progress Notes (Signed)
NAME:  Eddie Chapman, MRN:  [id number], DOB:  [birth year], LOS: 1 ADMISSION DATE:  05/10/2021, CONSULTATION DATE:  05/10/21 REFERRING MD:  Dr. Archie Balboa, CHIEF COMPLAINT: Shortness of breath  Brief Pt Description / Synopsis:  60 year old male tracheostomy dependent with past medical history significant for COPD, obesity hypoventilation syndrome, and HFpEF admitted with acute on chronic hypoxic and hypercapnic respiratory failure in setting of AECOPD requiring mechanical ventilation.  History of Present Illness:  Eddie Chapman is a 60 year old male with a past medical history significant for tracheostomy dependence due to chronic hypoxic hypercapnic respiratory failure, COPD, obesity hypoventilation syndrome, and HFpEF who presents to [facility] ED on 05/10/2021 due to complaints of shortness of breath.  Per patient and spouse's report, he became more acutely short of breath this afternoon approximately 2 hours before presentation.  They tried placing him on his nightly ventilator without any significant improvement in respiratory status.  He does report associated wheezing.  He denies cough, sputum production, fever, chills, chest pain, palpitations, nausea, vomiting, diarrhea, abdominal pain, dysuria, or sick contacts.  Of note he was recently admitted at [facility] from 04/25/2021 to 05/04/2021 for treatment of acute on chronic hypoxic and hypercapnic respiratory failure due to AE COPD, obesity hypoventilation syndrome and underlying chronic diastolic heart failure.  ED Course: Upon arrival to the ED he was noted to be in respiratory distress with significant tachypnea. On exam he exhibited diffuse rhonchi/wheezing.  ABG was concerning for significant hypercapnia and hypoxia.  He was subsequently placed on mechanical ventilation with noted improvement in respiratory status.  He was also given 125 mg of IV Solu-Medrol, bronchodilators, and azithromycin.  Initial vital signs: Blood pressure 118/60, pulse 30, respiratory  rate 35, temperature 98.4, SPO2 97% Labs: Bicarbonate 42, glucose 154, BNP 32.3, high-sensitivity troponin 11, WBC 11.0 with neutrophilia, COVID-19 PCR negative ABG: pH 7.31/PCO2 103/PO2 74/HCO3 51.9 Imaging: Chest x-ray: Stable cardiomegaly. Tracheostomy tube is unchanged in position. Old left rib fractures are noted. No pneumothorax is noted. Possible small left pleural effusion is noted. Right lung is clear.  PCCM is asked to admit the patient to ICU for further work-up and treatment of acute on chronic hypoxic and hypercapnic respiratory failure in setting of AECOPD requiring mechanical ventilation.   Pertinent  Medical History  COPD Obesity hypoventilation syndrome Pneumonia HFpEF Hypertension Hyperlipidemia GERD T8 vertebral fracture  Micro Data:  05/10/2021: SARS-CoV-2 and influenza PCR>> negative 05/10/2021: Tracheal aspirate>>  Antimicrobials:  Azithromycin 8/11>>  Significant Hospital Events: Including procedures, antibiotic start and stop dates in addition to other pertinent events   05/10/2021: Presented to ED with shortness of breath, tracheostomy connected to ventilator in ED, PCCM asked to admit 8/12 remains on vent  Interim History / Subjective:  +resp distress Remains on vent +COPD exacerbation   Objective   Blood pressure 98/64, pulse 65, temperature 98.8 F (37.1 C), temperature source Axillary, resp. rate 20, height _0  (1.778 m), weight (!) 142.5 kg, SpO2 100 %.    Vent Mode: PCV FiO2 (%):  [40 %-50 %] 50 % Set Rate:  [16 bmp-24 bmp] 16 bmp PEEP:  [5 cmH20] 5 cmH20 Plateau Pressure:  [17 cmH20-32 cmH20] 17 cmH20   Intake/Output Summary (Last 24 hours) at 05/11/2021 0707 Last data filed at 05/10/2021 2142 Gross per 24 hour  Intake --  Output 200 ml  Net -200 ml   Filed Weights   05/10/21 1607 05/10/21 2100  Weight: (!) 149.9 kg (!) 142.5 kg     Review of Systems: +SOB +  cough Other:  All other systems negative     Assessment & Plan:    Acute on Chronic Hypoxic & Hypercapnic Respiratory Failure in the setting of AECOPD PMHx of Obesity Hypoventilation Syndrome, Chronic Tracheostomy  Severe ACUTE Hypoxic and Hypercapnic Respiratory Failure  -continue Mechanical Ventilator support -continue Bronchodilator Therapy -Wean Fio2 and PEEP as tolerated -VAP/VENT bundle implementation -will perform SAT/SBT when respiratory parameters are met   SEVERE COPD EXACERBATION -continue IV steroids as prescribed -continue NEB THERAPY as prescribed   Chronic HFpEF without acute exacerbation Lasix as BP and renal function permits  Mild Leukocytosis, ? Pneumonia Continue IV abx   GERD -IV Pepcid BID   Best Practice (right click and "Reselect all SmartList Selections" daily)   Diet/type: NPO, will need speech evaluation once weaned from vent DVT prophylaxis: LMWH GI prophylaxis: H2B Lines: N/A Foley:  N/A Code Status:  full code DNR status previously Last date of multidisciplinary goals of care discussion [N/A]    Labs   CBC: Recent Labs  Lab 05/10/21 1515 05/11/21 0436  WBC 11.0* 9.1  NEUTROABS 8.7*  --   HGB 13.6 12.6*  HCT 42.9 38.4*  MCV 102.6* 98.7  PLT 179 176     Basic Metabolic Panel: Recent Labs  Lab 05/10/21 1515 05/11/21 0436  NA 141 139  K 4.6 4.6  CL 91* 93*  CO2 42* 34*  GLUCOSE 154* 184*  BUN 19 30*  CREATININE 0.84 1.37*  CALCIUM 8.9 9.0  MG  --  2.0  PHOS  --  1.3*    GFR: Estimated Creatinine Clearance: 80.7 mL/min (A) (by C-G formula based on SCr of 1.37 mg/dL (H)). Recent Labs  Lab 05/10/21 1515 05/11/21 0436  PROCALCITON <0.10  --   WBC 11.0* 9.1     Liver Function Tests: No results for input(s): AST, ALT, ALKPHOS, BILITOT, PROT, ALBUMIN in the last 168 hours. No results for input(s): LIPASE, AMYLASE in the last 168 hours. No results for input(s): AMMONIA in the last 168 hours.  ABG    Component Value Date/Time   PHART 7.55 (H) 05/11/2021 0405   PCO2ART 46  05/11/2021 0405   PO2ART 65 (L) 05/11/2021 0405   HCO3 40.2 (H) 05/11/2021 0405   O2SAT 94.9 05/11/2021 0405      Coagulation Profile: No results for input(s): INR, PROTIME in the last 168 hours.  Cardiac Enzymes: No results for input(s): CKTOTAL, CKMB, CKMBINDEX, TROPONINI in the last 168 hours.  HbA1C: Hgb A1c MFr Bld  Date/Time Value Ref Range Status  04/29/2021 05:02 AM 5.6 4.8 - 5.6 % Final    Comment:    (NOTE) Pre diabetes:          5.7%-6.4%  Diabetes:              >6.4%  Glycemic control for   <7.0% adults with diabetes   03/02/2021 04:52 AM 5.9 (H) 4.8 - 5.6 % Final    Comment:    (NOTE)         Prediabetes: 5.7 - 6.4         Diabetes: >6.4         Glycemic control for adults with diabetes: <7.0      DVT/GI PRX  assessed I Assessed the need for Labs I Assessed the need for Foley I Assessed the need for Central Venous Line Family Discussion when available I Assessed the need for Mobilization I made an Assessment of medications to be adjusted accordingly Safety Risk assessment completed  CASE DISCUSSED IN MULTIDISCIPLINARY ROUNDS WITH ICU TEAM     Critical Care Time devoted to patient care services described in this note is 45 minutes.  Critical care was necessary to treat /prevent imminent and life-threatening deterioration. Overall, patient is critically ill, prognosis is guarded.    Corrin Parker, M.D.  [facility]  Medical Director Parker Director [facility] Cardio-Pulmonary Department

## 2021-05-11 NOTE — Evaluation (Signed)
Occupational Therapy Evaluation Patient Details Name: Eddie Chapman MRN: [id number] DOB: [birth year] Today's Date: 05/11/2021    History of Present Illness presented to ER secondary top progressive SOB; admitted for management of acute/chronic hypoxic hypercarbic respiratory failure due to AECOPD (trach dependant).  Of note, recently admitted at [facility] from 04/25/2021 to 05/04/2021 for similar diagnosis/course; appears patient/significant other may be unable to consistently utilize home vent system.   Clinical Impression   Eddie Chapman was seen for OT/PT co-evaluation this date. Prior to hospital admission, pt was MOD I using RW and PRN assist for LB access. Pt lives with girlfriend in 1 level home c 2 STE. Pt presents to acute OT demonstrating impaired ADL performance and functional mobility 2/2 decreased activity tolerance, functional strength/ROM/balance deficits, and poor insight into deficits. Pt currently requires SETUP don B socks at bed level - pt requires increased time and reports fatigue. MIN A x2 for ADL t/f - assist for equipment mgmt and lift off. Anticipate MAX A for toileting 2/2 body habitus and pt requires BUE support static standing. Pt would benefit from skilled OT to address noted impairments and functional limitations (see below for any additional details) in order to maximize safety and independence while minimizing falls risk and caregiver burden. Upon hospital discharge, recommend STR to maximize pt safety and return to PLOF.     Follow Up Recommendations  SNF (transition to LTC if family unable to manage vent)    Equipment Recommendations  Other (comment) (TBD)    Recommendations for Other Services       Precautions / Restrictions Precautions Precautions: Fall Precaution Comments: trach collar, PMV Restrictions Weight Bearing Restrictions: No      Mobility Bed Mobility Overal bed mobility: Modified Independent Bed Mobility: Supine to Sit                 Transfers Overall transfer level: Needs assistance Equipment used: Rolling walker (2 wheeled) Transfers: Sit to/from Stand Sit to Stand: Min assist;+2 safety/equipment         General transfer comment: use of momentum to initiate lift off    Balance Overall balance assessment: Needs assistance Sitting-balance support: No upper extremity supported;Feet supported Sitting balance-Leahy Scale: Good     Standing balance support: Bilateral upper extremity supported Standing balance-Leahy Scale: Fair                             ADL either performed or assessed with clinical judgement   ADL Overall ADL's : Needs assistance/impaired                                       General ADL Comments: SETUP don B socks at bed level - pt requires increased time and reports fatigue. MIN A x2 for ADL t/f - assist for equipment mgmt and lift off. Anticipate MAX A for toileting 2/2 body habitus and pt requires BUE support static standing      Pertinent Vitals/Pain Pain Assessment: No/denies pain     Hand Dominance Right   Extremity/Trunk Assessment Upper Extremity Assessment Upper Extremity Assessment: Overall WFL for tasks assessed   Lower Extremity Assessment Lower Extremity Assessment: Generalized weakness       Communication Communication Communication: Tracheostomy   Cognition Arousal/Alertness: Awake/alert Behavior During Therapy: WFL for tasks assessed/performed Overall Cognitive Status: Within Functional Limits for tasks assessed  General Comments: Alert and oriented to basic information; generally impulsive with movement, but redirectable with cuing   General Comments       Exercises Exercises: Other exercises Other Exercises Other Exercises: Pt educated re: OT role, DME recs, d/c recs, falls prevention, ECS Other Exercises: LBD, sup>sit, sit<>stand, SPT, sitting/standing balance/tolerance    Shoulder Instructions      Home Living Family/patient expects to be discharged to:: Private residence Living Arrangements: Spouse/significant other Available Help at Discharge: Family;Available 24 hours/day Type of Home: House Home Access: Stairs to enter [employer] of Steps: 2 Entrance Stairs-Rails: None Home Layout: One level               Home Equipment: Walker - 2 wheels;Bedside commode;Other (comment);Grab bars - tub/shower;Grab bars - toilet;Hospital bed          Prior Functioning/Environment Level of Independence: Needs assistance  Gait / Transfers Assistance Needed: rollator for gait in home PRN, SPC for community use PRN, pt is able to ambulate around small store (ex: [place]) but not larger store (ex: [employer]) 2/2 chronic heart issues ADL's / Homemaking Assistance Needed: Per SO, pt requires occsional assistance for LB access during ADL management.   Comments: Per patient, during time at home (between hospitalizations), was ambulatory with RW for limited household distances; does endorse fall getting in/out of home at last discharge (EMS assisted recovery)        OT Problem List: Decreased strength;Decreased coordination;Cardiopulmonary status limiting activity;Decreased range of motion;Decreased cognition;Decreased activity tolerance;Decreased safety awareness;Impaired balance (sitting and/or standing);Decreased knowledge of use of DME or AE;Impaired UE functional use;Obesity      OT Treatment/Interventions: Self-care/ADL training;Therapeutic exercise;Therapeutic activities;Energy conservation;Visual/perceptual remediation/compensation;DME and/or AE instruction;Balance training;Patient/family education;Manual therapy;Modalities;Cognitive remediation/compensation;Neuromuscular education    OT Goals(Current goals can be found in the care plan section) Acute Rehab OT Goals Patient Stated Goal: to return home OT Goal Formulation: With patient Time For  Goal Achievement: 05/25/21 Potential to Achieve Goals: Good ADL Goals Pt Will Perform Grooming: with modified independence;standing (c LRAD PRN) Pt Will Perform Lower Body Dressing: with modified independence;sit to/from stand (c LRAD PRN) Pt Will Transfer to Toilet: with modified independence;regular height toilet (c LRAD PRN)  OT Frequency: Min 2X/week   Barriers to D/C: Inaccessible home environment;Decreased caregiver support          Co-evaluation PT/OT/SLP Co-Evaluation/Treatment: Yes Reason for Co-Treatment: Complexity of the patient's impairments (multi-system involvement);For patient/therapist safety PT goals addressed during session: Mobility/safety with mobility OT goals addressed during session: ADL's and self-care      AM-PAC OT "6 Clicks" Daily Activity     Outcome Measure Help from another person eating meals?: None Help from another person taking care of personal grooming?: A Little Help from another person toileting, which includes using toliet, bedpan, or urinal?: A Lot Help from another person bathing (including washing, rinsing, drying)?: A Little Help from another person to put on and taking off regular upper body clothing?: A Little Help from another person to put on and taking off regular lower body clothing?: A Little 6 Click Score: 18   End of Session Equipment Utilized During Treatment: Rolling walker;Oxygen Nurse Communication: Mobility status  Activity Tolerance: Patient tolerated treatment well Patient left: in chair;with call bell/phone within reach;with chair alarm set;with nursing/sitter in room  OT Visit Diagnosis: Other abnormalities of gait and mobility (R26.89);Muscle weakness (generalized) (M62.81);Other symptoms and signs involving cognitive function                Time:  [id number] OT Time Calculation (min): 20 min Charges:  OT General Charges $OT Visit: 1 Visit OT Evaluation $OT Eval Low Complexity: 1 Low  Dessie Coma, M.S. OTR/L   05/11/21, 5:01 PM  ascom [phone]

## 2021-05-12 ENCOUNTER — Inpatient Hospital Stay: Payer: PPO

## 2021-05-12 DIAGNOSIS — J9622 Acute and chronic respiratory failure with hypercapnia: Secondary | ICD-10-CM | POA: Diagnosis not present

## 2021-05-12 DIAGNOSIS — J9621 Acute and chronic respiratory failure with hypoxia: Secondary | ICD-10-CM | POA: Diagnosis not present

## 2021-05-12 LAB — CBC
HCT: 39 % (ref 39.0–52.0)
Hemoglobin: 12.6 g/dL — ABNORMAL LOW (ref 13.0–17.0)
MCH: 31.9 pg (ref 26.0–34.0)
MCHC: 32.3 g/dL (ref 30.0–36.0)
MCV: 98.7 fL (ref 80.0–100.0)
Platelets: 178 10*3/uL (ref 150–400)
RBC: 3.95 MIL/uL — ABNORMAL LOW (ref 4.22–5.81)
RDW: 13.8 % (ref 11.5–15.5)
WBC: 16.1 10*3/uL — ABNORMAL HIGH (ref 4.0–10.5)
nRBC: 0 % (ref 0.0–0.2)

## 2021-05-12 LAB — BASIC METABOLIC PANEL
Anion gap: 9 (ref 5–15)
BUN: 39 mg/dL — ABNORMAL HIGH (ref 8–23)
CO2: 37 mmol/L — ABNORMAL HIGH (ref 22–32)
Calcium: 8.9 mg/dL (ref 8.9–10.3)
Chloride: 93 mmol/L — ABNORMAL LOW (ref 98–111)
Creatinine, Ser: 1.17 mg/dL (ref 0.61–1.24)
GFR, Estimated: >60 mL/min (ref >60)
Glucose, Bld: 117 mg/dL — ABNORMAL HIGH (ref 70–99)
Potassium: 3.9 mmol/L (ref 3.5–5.1)
Sodium: 139 mmol/L (ref 135–145)

## 2021-05-12 LAB — GLUCOSE, CAPILLARY
Glucose-Capillary: 173 mg/dL — ABNORMAL HIGH (ref 70–99)
Glucose-Capillary: 111 mg/dL — ABNORMAL HIGH (ref 70–99)
Glucose-Capillary: 165 mg/dL — ABNORMAL HIGH (ref 70–99)
Glucose-Capillary: 222 mg/dL — ABNORMAL HIGH (ref 70–99)
Glucose-Capillary: 144 mg/dL — ABNORMAL HIGH (ref 70–99)
Glucose-Capillary: 191 mg/dL — ABNORMAL HIGH (ref 70–99)

## 2021-05-12 LAB — PHOSPHORUS: Phosphorus: 3.8 mg/dL (ref 2.5–4.6)

## 2021-05-12 LAB — MAGNESIUM: Magnesium: 2 mg/dL (ref 1.7–2.4)

## 2021-05-12 MED ORDER — ENOXAPARIN SODIUM 80 MG/0.8ML IJ SOSY
0.5000 mg/kg | PREFILLED_SYRINGE | INTRAMUSCULAR | Status: DC
  Administered 2021-05-12 – 2021-05-16 (×5): 72.5 mg via SUBCUTANEOUS
  Filled 2021-05-12 (×5): qty 0.8

## 2021-05-12 NOTE — Consult Note (Signed)
PHARMACY CONSULT NOTE - FOLLOW UP  Pharmacy Consult for Electrolyte Monitoring and Replacement   Recent Labs: Potassium (mmol/L)  Date Value  05/12/2021 3.9  05/04/2014 4.3   Magnesium (mg/dL)  Date Value  05/12/2021 2.0   Calcium (mg/dL)  Date Value  05/12/2021 8.9   Calcium, Total (mg/dL)  Date Value  05/04/2014 9.1   Albumin (g/dL)  Date Value  04/25/2021 3.4 (L)   Phosphorus (mg/dL)  Date Value  05/12/2021 3.8   Sodium (mmol/L)  Date Value  05/12/2021 139  05/04/2014 140     Assessment: 60 y.o. male who presents to the emergency department today because of concern for shortness of breath. Hx of HFpEF, COPD,tracheostomy placed 02/2021. Pharmacy has been consulted to monitor and replace electrolytes as needed.   Goal of Therapy:  Electrolytes WNL  Plan:  No replacement required today.  Follow up with BMP with AM labs  Eddie Chapman ,PharmD, BCPS Clinical Pharmacist 05/12/2021 7:22 AM

## 2021-05-12 NOTE — TOC Initial Note (Signed)
Transition of Care [facility]) - Initial/Assessment Note    Patient Details  Name: KOLTYN KELSAY MRN: [id number] Date of Birth: 1961-01-15  Transition of [facility] CM/SW Contact:    Anselm Pancoast, RN Phone Number: 05/12/2021, 10:13 AM  Clinical Narrative:                 Met with significant other and patient and reviewed discharge plan. [name], So is primary caregiver and appreciative of help received. States they are active with [facility] and wants to resume services with them at discharge. Hopes to add nursing and nursing aide at discharge. Discussed [facility] CM as additional support and both are receptive and appreciative of additional resource.   Expected Discharge Plan: [place] Barriers to Discharge: Continued Medical Work up   Patient Goals and CMS Choice Patient states their goals for this hospitalization and ongoing recovery are:: Return home with home health care CMS Medicare.gov Compare Post Acute Care list provided to:: Other (Comment Required) (Significant other present)    Expected Discharge Plan and Services Expected Discharge Plan: [place]   Discharge Planning Services: CM Consult Post Acute Care Choice: Durable Medical Equipment, Home Health Living arrangements for the past 2 months: Single Family Home                   DME Agency: [employer]                  Prior Living Arrangements/Services Living arrangements for the past 2 months: Single Family Home Lives with:: Significant Other Patient language and need for interpreter reviewed:: Yes Do you feel safe going back to the place where you live?: Yes      Need for Family Participation in Patient Care: Yes (Comment) Care giver support system in place?: Yes (comment) Current home services: DME, Home OT, Home PT, Home RN (Active with [facility]) Criminal Activity/Legal Involvement Pertinent to Current Situation/Hospitalization: No - Comment as needed  Activities of Daily  Living Home Assistive Devices/Equipment: Walker (specify type) ADL Screening (condition at time of admission) Patient's cognitive ability adequate to safely complete daily activities?: Yes Is the patient deaf or have difficulty hearing?: No Does the patient have difficulty seeing, even when wearing glasses/contacts?: No Does the patient have difficulty concentrating, remembering, or making decisions?: No Patient able to express need for assistance with ADLs?: Yes Does the patient have difficulty dressing or bathing?: Yes Independently performs ADLs?: No Dressing (OT): Needs assistance Is this a change from baseline?: Pre-admission baseline Does the patient have difficulty walking or climbing stairs?: Yes Weakness of Legs: Both Weakness of Arms/Hands: None  Permission Sought/Granted Permission sought to share information with : Family Supports Permission granted to share information with : Yes, Verbal Permission Granted  Share Information with NAME: Trenda Moots [phone]  Permission granted to share info w AGENCY: [facility] and [facility] CM referral.        Emotional Assessment Appearance:: Appears older than stated age Attitude/Demeanor/Rapport: Engaged Affect (typically observed): Stable Orientation: : Oriented to Self, Oriented to Place, Oriented to  Time, Oriented to Situation Alcohol / Substance Use: Not Applicable Psych Involvement: No (comment)  Admission diagnosis:  Acute respiratory failure with hypoxia and hypercarbia (HCC) [J96.01, J96.02] Acute on chronic respiratory failure with hypoxia and hypercapnia (HCC) [H67.59, J96.22] Patient Active Problem List   Diagnosis Date Noted   Acute on chronic respiratory failure with hypoxia and hypercapnia (HCC) 05/10/2021   Acute on chronic respiratory failure  with hypercapnia ([place]) 04/25/2021   Depression    Atelectasis    Leukocytosis    Fever    Acute on chronic systolic congestive heart failure (HCC)    Acute respiratory  failure with hypoxia ([place])    Palliative care by specialist    Respiratory failure with hypercapnia ([place]) 02/23/2021   Acute respiratory failure with hypoxia and hypercapnia ([place]) 10/07/2020   OSA (obstructive sleep apnea) 10/07/2020   Obesity hypoventilation syndrome ([place]) 43/15/4008   Acute diastolic CHF (congestive heart failure) ([place]) 10/07/2020   Obesity, Class III, BMI 40-49.9 (morbid obesity) ([place]) 10/07/2020   Occlusion and stenosis of vertebral artery 08/19/2016   Aneurysm, cerebral, nonruptured 08/19/2016   Essential hypertension 08/19/2016   Hyperlipidemia 08/19/2016   PCP:  Adin Hector, MD Pharmacy:   [place], [place] - [place] [address] [place] [address] Phone: [phone] Fax: [phone]     Social Determinants of Health (SDOH) Interventions    Readmission Risk Interventions No flowsheet data found.

## 2021-05-12 NOTE — Progress Notes (Signed)
Placed pt on vent for overnight.  Following cuff inflation & connection to vent, pt became panicked & had desats into 70's.  Took him off the vent and bagged, lavaged & suctioned lg mucus plug.  Pt placed back on the vent & no longer in distress. O2 sat now 100% on FIO2 .35

## 2021-05-12 NOTE — Progress Notes (Signed)
Ppt placed on vent for overnight. Pt comfortable, in no distress.

## 2021-05-12 NOTE — Progress Notes (Signed)
NAME:  Eddie Chapman, MRN:  [id number], DOB:  1960/12/05, LOS: 2 ADMISSION DATE:  05/10/2021, INITIAL CONSULTATION DATE:  05/10/21 REFERRING MD:  Dr. Archie Balboa, CHIEF COMPLAINT:  Shortness of breath   Brief Patient Description  60 year old male tracheostomy dependent with past medical history significant for COPD, obesity hypoventilation syndrome, and HFpEF admitted with acute on chronic hypoxic and hypercapnic respiratory failure in setting of AECOPD requiring mechanical ventilation.  Of note he was recently admitted at [facility] from 04/25/2021 to 05/04/2021 for treatment of acute on chronic hypoxic and hypercapnic respiratory failure due to AE COPD, obesity hypoventilation syndrome and underlying chronic diastolic heart failure.  ED Course: Upon arrival to the ED he was noted to be in respiratory distress with significant tachypnea. On exam he exhibited diffuse rhonchi/wheezing.  ABG was concerning for significant hypercapnia and hypoxia.  He was subsequently placed on mechanical ventilation with noted improvement in respiratory status.  He was also given 125 mg of IV Solu-Medrol, bronchodilators, and azithromycin.   Initial vital signs: Blood pressure 118/60, pulse 30, respiratory rate 35, temperature 98.4, SPO2 97% Labs: Bicarbonate 42, glucose 154, BNP 32.3, high-sensitivity troponin 11, WBC 11.0 with neutrophilia, COVID-19 PCR negative ABG: pH 7.31/PCO2 103/PO2 74/HCO3 51.9 Imaging: Chest x-ray: Stable cardiomegaly. Tracheostomy tube is unchanged in position. Old left rib fractures are noted. No pneumothorax is noted. Possible small left pleural effusion is noted. Right lung is clear.   PCCM is asked to admit the patient to ICU for further work-up and treatment of acute on chronic hypoxic and hypercapnic respiratory failure in setting of AECOPD requiring mechanical ventilation  Pertinent  Medical History  COPD Obesity hypoventilation  syndrome Pneumonia HFpEF Hypertension Hyperlipidemia GERD T8 vertebral fracture Significant Hospital Events: Including procedures, antibiotic start and stop dates in addition to other pertinent events   05/10/2021: Presented to ED with shortness of breath, tracheostomy connected to ventilator in ED, PCCM asked to admit  Cultures:  SARS-CoV-2 PCR>> negative Influenza PCR>> negative   Antimicrobials:  Azithromycin 8/11>> Interim History / Subjective:  -Overnight pt was placed on the vent.  Following cuff inflation & connection to vent, pt became panicked & had desats into 70's.  Took him off the vent and bagged, lavaged & suctioned lg mucus plug.  Pt placed back on the vent & no longer in distress. O2 sat improved. -Trial Trach collars at night  OBJECTIVE  Blood pressure (!) 149/75, pulse (!) 112, temperature 99.1 F (37.3 C), temperature source Oral, resp. rate (!) 34, height '5\' 10"'  (1.778 m), weight (!) 142.5 kg, SpO2 92 %.    Vent Mode: PCV FiO2 (%):  [35 %-50 %] 40 % Set Rate:  [18 bmp] 18 bmp PEEP:  [5 cmH20] 5 cmH20   Intake/Output Summary (Last 24 hours) at 05/12/2021 1408 Last data filed at 05/12/2021 1200 Gross per 24 hour  Intake 1380 ml  Output 950 ml  Net 430 ml   Filed Weights   05/10/21 1607 05/10/21 2100  Weight: (!) 149.9 kg (!) 142.5 kg   Physical Examination: GENERAL: 60 year old patient lying in the bed with no acute distress.  EYES: Pupils equal, round, reactive to light and accommodation. No scleral icterus. Extraocular muscles intact.  HEENT: Head atraumatic, normocephalic. Oropharynx and nasopharynx clear.  NECK:  Supple, no jugular venous distention. No thyroid enlargement, no tenderness.  LUNGS: Decreased breath sounds bilaterally, no wheezing, rales,rhonchi or crepitation. No use of accessory muscles of respiration.  CARDIOVASCULAR: S1, S2 normal. No murmurs, rubs, or gallops.  ABDOMEN: Soft, nontender, nondistended. Bowel sounds present. No  organomegaly or mass.  EXTREMITIES: No pedal edema, cyanosis, or clubbing.  NEUROLOGIC: Cranial nerves II through XII are intact. Muscle strength 5/5 in all extremities. Sensation intact. Gait not checked.  PSYCHIATRIC: The patient is alert and oriented x 3.  SKIN: No obvious rash, lesion, or ulcer.   Labs/imaging that I havepersonally reviewed  (right click and "Reselect all SmartList Selections" daily)    Labs   CBC: Recent Labs  Lab 05/10/21 1515 05/11/21 0436 05/12/21 0528  WBC 11.0* 9.1 16.1*  NEUTROABS 8.7*  --   --   HGB 13.6 12.6* 12.6*  HCT 42.9 38.4* 39.0  MCV 102.6* 98.7 98.7  PLT 179 176 153    Basic Metabolic Panel: Recent Labs  Lab 05/10/21 1515 05/11/21 0436 05/12/21 0528  NA 141 139 139  K 4.6 4.6 3.9  CL 91* 93* 93*  CO2 42* 34* 37*  GLUCOSE 154* 184* 117*  BUN 19 30* 39*  CREATININE 0.84 1.37* 1.17  CALCIUM 8.9 9.0 8.9  MG  --  2.0 2.0  PHOS  --  1.3* 3.8   GFR: Estimated Creatinine Clearance: 94.5 mL/min (by C-G formula based on SCr of 1.17 mg/dL). Recent Labs  Lab 05/10/21 1515 05/11/21 0436 05/12/21 0528  PROCALCITON <0.10  --   --   WBC 11.0* 9.1 16.1*    Liver Function Tests: No results for input(s): AST, ALT, ALKPHOS, BILITOT, PROT, ALBUMIN in the last 168 hours. No results for input(s): LIPASE, AMYLASE in the last 168 hours. No results for input(s): AMMONIA in the last 168 hours.  ABG    Component Value Date/Time   PHART 7.55 (H) 05/11/2021 0405   PCO2ART 46 05/11/2021 0405   PO2ART 65 (L) 05/11/2021 0405   HCO3 40.2 (H) 05/11/2021 0405   O2SAT 94.9 05/11/2021 0405     Coagulation Profile: No results for input(s): INR, PROTIME in the last 168 hours.  Cardiac Enzymes: No results for input(s): CKTOTAL, CKMB, CKMBINDEX, TROPONINI in the last 168 hours.  HbA1C: Hgb A1c MFr Bld  Date/Time Value Ref Range Status  04/29/2021 05:02 AM 5.6 4.8 - 5.6 % Final    Comment:    (NOTE) Pre diabetes:           5.7%-6.4%  Diabetes:              >6.4%  Glycemic control for   <7.0% adults with diabetes   03/02/2021 04:52 AM 5.9 (H) 4.8 - 5.6 % Final    Comment:    (NOTE)         Prediabetes: 5.7 - 6.4         Diabetes: >6.4         Glycemic control for adults with diabetes: <7.0     CBG: Recent Labs  Lab 05/11/21 1916 05/11/21 2301 05/12/21 0311 05/12/21 0749 05/12/21 1139  GLUCAP 179* 177* 173* 111* 165*    Allergies Allergies  Allergen Reactions   Amlodipine Swelling    Ankle swelling    Divalproex Sodium Other (See Comments)    Elevated liver enzymes     Home Medications  Prior to Admission medications   Medication Sig Start Date End Date Taking? Authorizing Provider  ARIPiprazole (ABILIFY) 5 MG tablet Take 1 tablet (5 mg total) by mouth daily. 04/06/21  Yes Darel Hong D, NP  ascorbic acid (VITAMIN C) 250 MG tablet Take 1 tablet (250 mg total) by mouth 2 (two) times daily. 04/05/21  Yes Bradly Bienenstock, NP  docusate (COLACE) 50 MG/5ML liquid Place 10 mLs (100 mg total) into feeding tube 2 (two) times daily. 04/05/21  Yes Darel Hong D, NP  famotidine (PEPCID) 20 MG tablet Take 1 tablet (20 mg total) by mouth 2 (two) times daily. 05/04/21 06/03/21 Yes Max Sane, MD  furosemide (LASIX) 40 MG tablet Take 1 tablet (40 mg total) by mouth 2 (two) times daily. 05/04/21 06/03/21 Yes Max Sane, MD  methylPREDNISolone (MEDROL DOSEPAK) 4 MG TBPK tablet Take 4-24 mg by mouth daily at 12 noon. 05/06/21  Yes [provider]  metoprolol tartrate (LOPRESSOR) 25 MG tablet Take 25 mg by mouth daily. 05/05/21  Yes [provider]  oxyCODONE 10 MG TABS Place 1 tablet (10 mg total) into feeding tube every 6 (six) hours as needed for moderate pain. 04/05/21  Yes Darel Hong D, NP  pantoprazole (PROTONIX) 40 MG tablet Take 40 mg by mouth daily. 05/05/21  Yes [provider]  senna-docusate (SENOKOT-S) 8.6-50 MG tablet Take 1 tablet by mouth 2 (two) times daily. 04/05/21  Yes  Bradly Bienenstock, NP  bisacodyl (DULCOLAX) 10 MG suppository Place 1 suppository (10 mg total) rectally daily as needed for moderate constipation. 04/05/21   Bradly Bienenstock, NP  Chlorhexidine Gluconate Cloth 2 % PADS Apply 6 each topically daily. 04/06/21   Bradly Bienenstock, NP  chlorhexidine gluconate, MEDLINE KIT, (PERIDEX) 0.12 % solution 15 mLs by Mouth Rinse route 2 (two) times daily. 04/05/21   Bradly Bienenstock, NP  feeding supplement (ENSURE ENLIVE / ENSURE PLUS) LIQD Take 237 mLs by mouth 3 (three) times daily between meals. 04/05/21   Darel Hong D, NP  ipratropium-albuterol (DUONEB) 0.5-2.5 (3) MG/3ML SOLN Take 3 mLs by nebulization 3 (three) times daily. 04/05/21   Bradly Bienenstock, NP  polyethylene glycol (MIRALAX / GLYCOLAX) 17 g packet Take 17 g by mouth daily. 04/06/21   Bradly Bienenstock, NP  polyvinyl alcohol (LIQUIFILM TEARS) 1.4 % ophthalmic solution Place 1 drop into both eyes as needed for dry eyes. 04/05/21   Bradly Bienenstock, NP    Scheduled Meds:  budesonide (PULMICORT) nebulizer solution  0.5 mg Nebulization BID   chlorhexidine  15 mL Mouth Rinse BID   Chlorhexidine Gluconate Cloth  6 each Topical Q0600   enoxaparin (LOVENOX) injection  0.5 mg/kg Subcutaneous Q24H   insulin aspart  0-15 Units Subcutaneous Q4H   ipratropium-albuterol  3 mL Nebulization Q6H   mouth rinse  15 mL Mouth Rinse q12n4p   methylPREDNISolone (SOLU-MEDROL) injection  40 mg Intravenous Q24H   sertraline  25 mg Oral Daily   Continuous Infusions:  azithromycin Stopped (05/12/21 1039)   famotidine (PEPCID) IV Stopped (05/12/21 1154)   PRN Meds:.acetaminophen, docusate sodium, ipratropium-albuterol, ondansetron (ZOFRAN) IV, oxyCODONE, polyethylene glycol  Resolved Hospital Problem list    ASSESSMENT & PLAN  Acute on Chronic Hypoxic & Hypercapnic Respiratory Failure in the setting of AECOPD PMHx of Obesity Hypoventilation Syndrome, Chronic Tracheostomy -Trial Trach Collar FIO2 40% with Full vent  support at night -Wean FiO2 & PEEP as tolerated to maintain O2 sats >92% -Follow intermittent Chest X-ray & ABG as needed -Implement VAP Bundle -Scheduled & Prn Bronchodilators -IV Solumedrol & Budesonide nebs -Azithromycin for severe COPD Exacerbation   Chronic HFpEF without acute exacerbation -Continuous cardiac monitoring -Maintain MAP >65 -BNP 32.3 -HS Troponin negative x1 (11) -Continue home Lasix as BP and renal function permits -Hold home Metoprolol as BP is soft   Mild  Leukocytosis, ? Pneumonia -Monitor fever curve -Trend WBC's & Procalcitonin -Follow cultures as above -Continue empiric Azithromycin pending cultures & sensitivities -Chest X-ray on 05/10/21 without obvious pneumonia ~ will follow serial CXR's -Urinalysis is pending   Hyperglycemia -CBG's -SSI -Follow ICU Hypo/Hyperglycemia protocol   GERD -IV Pepcid BID   Best practice (right click and "Reselect all SmartList Selections" daily)  Diet:  NPO Pain/Anxiety/Delirium protocol (if indicated): No VAP protocol (if indicated): Yes DVT prophylaxis: Subcutaneous Heparin GI prophylaxis: H2B Glucose control:  SSI No Central venous access:  N/A Arterial line:  N/A Foley:  N/A Mobility:  bed rest  PT consulted: N/A Last date of multidisciplinary goals of care discussion [8/13] Code Status:  full code Disposition: ICU  Critical care time: [place], DNP, FNP-C, AGACNP-BC Acute Care Nurse Practitioner  [facility] Pager: [phone] [place] at [facility]

## 2021-05-13 DIAGNOSIS — J441 Chronic obstructive pulmonary disease with (acute) exacerbation: Secondary | ICD-10-CM | POA: Diagnosis not present

## 2021-05-13 DIAGNOSIS — J9622 Acute and chronic respiratory failure with hypercapnia: Secondary | ICD-10-CM | POA: Diagnosis not present

## 2021-05-13 DIAGNOSIS — J9621 Acute and chronic respiratory failure with hypoxia: Secondary | ICD-10-CM | POA: Diagnosis not present

## 2021-05-13 LAB — GLUCOSE, CAPILLARY
Glucose-Capillary: 90 mg/dL (ref 70–99)
Glucose-Capillary: 112 mg/dL — ABNORMAL HIGH (ref 70–99)
Glucose-Capillary: 144 mg/dL — ABNORMAL HIGH (ref 70–99)
Glucose-Capillary: 136 mg/dL — ABNORMAL HIGH (ref 70–99)
Glucose-Capillary: 104 mg/dL — ABNORMAL HIGH (ref 70–99)
Glucose-Capillary: 195 mg/dL — ABNORMAL HIGH (ref 70–99)

## 2021-05-13 LAB — BASIC METABOLIC PANEL
Anion gap: 13 (ref 5–15)
BUN: 28 mg/dL — ABNORMAL HIGH (ref 8–23)
CO2: 32 mmol/L (ref 22–32)
Calcium: 8.6 mg/dL — ABNORMAL LOW (ref 8.9–10.3)
Chloride: 95 mmol/L — ABNORMAL LOW (ref 98–111)
Creatinine, Ser: 0.91 mg/dL (ref 0.61–1.24)
GFR, Estimated: >60 mL/min (ref >60)
Glucose, Bld: 87 mg/dL (ref 70–99)
Potassium: 4.1 mmol/L (ref 3.5–5.1)
Sodium: 140 mmol/L (ref 135–145)

## 2021-05-13 LAB — BLOOD GAS, ARTERIAL
Acid-Base Excess: 15.6 mmol/L — ABNORMAL HIGH (ref 0.0–2.0)
Bicarbonate: 39.4 mmol/L — ABNORMAL HIGH (ref 20.0–28.0)
FIO2: 0.4
O2 Saturation: 95.6 %
Patient temperature: 37
pCO2 arterial: 43 mmHg (ref 32.0–48.0)
pH, Arterial: 7.57 — ABNORMAL HIGH (ref 7.350–7.450)
pO2, Arterial: 67 mmHg — ABNORMAL LOW (ref 83.0–108.0)

## 2021-05-13 LAB — CBC
HCT: 37.3 % — ABNORMAL LOW (ref 39.0–52.0)
Hemoglobin: 12.2 g/dL — ABNORMAL LOW (ref 13.0–17.0)
MCH: 32.8 pg (ref 26.0–34.0)
MCHC: 32.7 g/dL (ref 30.0–36.0)
MCV: 100.3 fL — ABNORMAL HIGH (ref 80.0–100.0)
Platelets: 171 10*3/uL (ref 150–400)
RBC: 3.72 MIL/uL — ABNORMAL LOW (ref 4.22–5.81)
RDW: 13.9 % (ref 11.5–15.5)
WBC: 12.3 10*3/uL — ABNORMAL HIGH (ref 4.0–10.5)
nRBC: 0 % (ref 0.0–0.2)

## 2021-05-13 LAB — PHOSPHORUS: Phosphorus: 2.7 mg/dL (ref 2.5–4.6)

## 2021-05-13 LAB — MAGNESIUM: Magnesium: 2.1 mg/dL (ref 1.7–2.4)

## 2021-05-13 LAB — PROCALCITONIN: Procalcitonin: <0.1 ng/mL

## 2021-05-13 MED ORDER — HYDROCOD POLST-CPM POLST ER 10-8 MG/5ML PO SUER
5.0000 mL | Freq: Two times a day (BID) | ORAL | Status: DC | PRN
  Administered 2021-05-13 – 2021-05-17 (×7): 5 mL via ORAL
  Filled 2021-05-13 (×7): qty 5

## 2021-05-13 MED ORDER — PREDNISONE 10 MG PO TABS
20.0000 mg | ORAL_TABLET | Freq: Every day | ORAL | Status: DC
  Administered 2021-05-14 – 2021-05-15 (×2): 20 mg via ORAL
  Filled 2021-05-13 (×2): qty 2

## 2021-05-13 NOTE — Consult Note (Signed)
PHARMACY CONSULT NOTE - FOLLOW UP  Pharmacy Consult for Electrolyte Monitoring and Replacement   Recent Labs: Potassium (mmol/L)  Date Value  05/13/2021 4.1  05/04/2014 4.3   Magnesium (mg/dL)  Date Value  05/13/2021 2.1   Calcium (mg/dL)  Date Value  05/13/2021 8.6 (L)   Calcium, Total (mg/dL)  Date Value  05/04/2014 9.1   Albumin (g/dL)  Date Value  04/25/2021 3.4 (L)   Phosphorus (mg/dL)  Date Value  05/13/2021 2.7   Sodium (mmol/L)  Date Value  05/13/2021 140  05/04/2014 140     Assessment: 60 y.o. male who presents to the emergency department today because of concern for shortness of breath. Hx of HFpEF, COPD,tracheostomy placed 02/2021. Pharmacy has been consulted to monitor and replace electrolytes as needed.   Goal of Therapy:  Electrolytes WNL  Plan:  No replacement required today.  Follow up with BMP with AM labs  Oswald Hillock ,PharmD, BCPS Clinical Pharmacist 05/13/2021 9:43 AM

## 2021-05-13 NOTE — Progress Notes (Signed)
NAME:  Eddie Chapman, MRN:  [id number], DOB:  06/16/1961, LOS: 3 ADMISSION DATE:  05/10/2021, INITIAL CONSULTATION DATE:  05/10/21 REFERRING MD:  Dr. Archie Balboa, CHIEF COMPLAINT:  Shortness of breath   Brief Patient Description  60 year old male tracheostomy dependent with past medical history significant for COPD, obesity hypoventilation syndrome, and HFpEF admitted with acute on chronic hypoxic and hypercapnic respiratory failure in setting of AECOPD requiring mechanical ventilation.  Of note he was recently admitted at [facility] from 04/25/2021 to 05/04/2021 for treatment of acute on chronic hypoxic and hypercapnic respiratory failure due to AE COPD, obesity hypoventilation syndrome and underlying chronic diastolic heart failure.  ED Course: Upon arrival to the ED he was noted to be in respiratory distress with significant tachypnea. On exam he exhibited diffuse rhonchi/wheezing.  ABG was concerning for significant hypercapnia and hypoxia.  He was subsequently placed on mechanical ventilation with noted improvement in respiratory status.  He was also given 125 mg of IV Solu-Medrol, bronchodilators, and azithromycin.   Initial vital signs: Blood pressure 118/60, pulse 30, respiratory rate 35, temperature 98.4, SPO2 97% Labs: Bicarbonate 42, glucose 154, BNP 32.3, high-sensitivity troponin 11, WBC 11.0 with neutrophilia, COVID-19 PCR negative ABG: pH 7.31/PCO2 103/PO2 74/HCO3 51.9 Imaging: Chest x-ray: Stable cardiomegaly. Tracheostomy tube is unchanged in position. Old left rib fractures are noted. No pneumothorax is noted. Possible small left pleural effusion is noted. Right lung is clear.   PCCM is asked to admit the patient to ICU for further work-up and treatment of acute on chronic hypoxic and hypercapnic respiratory failure in setting of AECOPD requiring mechanical ventilation  Pertinent  Medical History  COPD Obesity hypoventilation  syndrome Pneumonia HFpEF Hypertension Hyperlipidemia GERD T8 vertebral fracture Significant Hospital Events: Including procedures, antibiotic start and stop dates in addition to other pertinent events   05/10/2021: Presented to ED with shortness of breath, tracheostomy connected to ventilator in ED, PCCM asked to admit  Cultures:  SARS-CoV-2 PCR>> negative Influenza PCR>> negative   Antimicrobials:  Azithromycin 8/11>> Interim History / Subjective:  -Overnight pt was placed on the vent tolerated well.  OBJECTIVE  Blood pressure 116/90, pulse 99, temperature 98.2 F (36.8 C), temperature source Oral, resp. rate (!) 35, height '5\' 10"'  (1.778 m), weight (!) 147.8 kg, SpO2 96 %.    Vent Mode: PCV FiO2 (%):  [35 %-40 %] 40 % Set Rate:  [18 bmp] 18 bmp PEEP:  [5 cmH20] 5 cmH20   Intake/Output Summary (Last 24 hours) at 05/13/2021 0955 Last data filed at 05/13/2021 0437 Gross per 24 hour  Intake 950 ml  Output 2325 ml  Net -1375 ml    Filed Weights   05/10/21 1607 05/10/21 2100 05/13/21 0500  Weight: (!) 149.9 kg (!) 142.5 kg (!) 147.8 kg   Physical Examination: GENERAL: 60 year old patient lying in the bed with no acute distress. On Trach collar this morning at 40% FI02 EYES: Pupils equal, round, reactive to light and accommodation. No scleral icterus. Extraocular muscles intact.  HEENT: Head atraumatic, normocephalic. Oropharynx and nasopharynx clear.  NECK:  Supple, no jugular venous distention. No thyroid enlargement, no tenderness.  LUNGS: Decreased breath sounds bilaterally, no wheezing, rales,rhonchi or crepitation. No use of accessory muscles of respiration.  CARDIOVASCULAR: S1, S2 normal. No murmurs, rubs, or gallops.  ABDOMEN: Soft, nontender, nondistended. Bowel sounds present. No organomegaly or mass.  EXTREMITIES: No pedal edema, cyanosis, or clubbing.  NEUROLOGIC: Cranial nerves II through XII are intact. Muscle strength 5/5 in all extremities. Sensation intact.  Gait not checked.  PSYCHIATRIC: The patient is alert and oriented x 3.  SKIN: No obvious rash, lesion, or ulcer.   Labs/imaging that I havepersonally reviewed  (right click and "Reselect all SmartList Selections" daily)    Labs   CBC: Recent Labs  Lab 05/10/21 1515 05/11/21 0436 05/12/21 0528 05/13/21 0426  WBC 11.0* 9.1 16.1* 12.3*  NEUTROABS 8.7*  --   --   --   HGB 13.6 12.6* 12.6* 12.2*  HCT 42.9 38.4* 39.0 37.3*  MCV 102.6* 98.7 98.7 100.3*  PLT 179 176 178 171     Basic Metabolic Panel: Recent Labs  Lab 05/10/21 1515 05/11/21 0436 05/12/21 0528 05/13/21 0426  NA 141 139 139 140  K 4.6 4.6 3.9 4.1  CL 91* 93* 93* 95*  CO2 42* 34* 37* 32  GLUCOSE 154* 184* 117* 87  BUN 19 30* 39* 28*  CREATININE 0.84 1.37* 1.17 0.91  CALCIUM 8.9 9.0 8.9 8.6*  MG  --  2.0 2.0 2.1  PHOS  --  1.3* 3.8 2.7    GFR: Estimated Creatinine Clearance: 124.1 mL/min (by C-G formula based on SCr of 0.91 mg/dL). Recent Labs  Lab 05/10/21 1515 05/11/21 0436 05/12/21 0528 05/13/21 0426  PROCALCITON <0.10  --   --  <0.10  WBC 11.0* 9.1 16.1* 12.3*     Liver Function Tests: No results for input(s): AST, ALT, ALKPHOS, BILITOT, PROT, ALBUMIN in the last 168 hours. No results for input(s): LIPASE, AMYLASE in the last 168 hours. No results for input(s): AMMONIA in the last 168 hours.  ABG    Component Value Date/Time   PHART 7.57 (H) 05/13/2021 0800   PCO2ART 43 05/13/2021 0800   PO2ART 67 (L) 05/13/2021 0800   HCO3 39.4 (H) 05/13/2021 0800   O2SAT 95.6 05/13/2021 0800      Coagulation Profile: No results for input(s): INR, PROTIME in the last 168 hours.  Cardiac Enzymes: No results for input(s): CKTOTAL, CKMB, CKMBINDEX, TROPONINI in the last 168 hours.  HbA1C: Hgb A1c MFr Bld  Date/Time Value Ref Range Status  04/29/2021 05:02 AM 5.6 4.8 - 5.6 % Final    Comment:    (NOTE) Pre diabetes:          5.7%-6.4%  Diabetes:              >6.4%  Glycemic control for    <7.0% adults with diabetes   03/02/2021 04:52 AM 5.9 (H) 4.8 - 5.6 % Final    Comment:    (NOTE)         Prediabetes: 5.7 - 6.4         Diabetes: >6.4         Glycemic control for adults with diabetes: <7.0     CBG: Recent Labs  Lab 05/12/21 1603 05/12/21 1935 05/12/21 2259 05/13/21 0342 05/13/21 0742  GLUCAP 222* 144* 191* 90 112*     Allergies Allergies  Allergen Reactions   Amlodipine Swelling    Ankle swelling    Divalproex Sodium Other (See Comments)    Elevated liver enzymes      Home Medications  Prior to Admission medications   Medication Sig Start Date End Date Taking? Authorizing Provider  ARIPiprazole (ABILIFY) 5 MG tablet Take 1 tablet (5 mg total) by mouth daily. 04/06/21  Yes Darel Hong D, NP  ascorbic acid (VITAMIN C) 250 MG tablet Take 1 tablet (250 mg total) by mouth 2 (two) times daily. 04/05/21  Yes Bradly Bienenstock,  NP  docusate (COLACE) 50 MG/5ML liquid Place 10 mLs (100 mg total) into feeding tube 2 (two) times daily. 04/05/21  Yes Darel Hong D, NP  famotidine (PEPCID) 20 MG tablet Take 1 tablet (20 mg total) by mouth 2 (two) times daily. 05/04/21 06/03/21 Yes Max Sane, MD  furosemide (LASIX) 40 MG tablet Take 1 tablet (40 mg total) by mouth 2 (two) times daily. 05/04/21 06/03/21 Yes Max Sane, MD  methylPREDNISolone (MEDROL DOSEPAK) 4 MG TBPK tablet Take 4-24 mg by mouth daily at 12 noon. 05/06/21  Yes [provider]  metoprolol tartrate (LOPRESSOR) 25 MG tablet Take 25 mg by mouth daily. 05/05/21  Yes [provider]  oxyCODONE 10 MG TABS Place 1 tablet (10 mg total) into feeding tube every 6 (six) hours as needed for moderate pain. 04/05/21  Yes Darel Hong D, NP  pantoprazole (PROTONIX) 40 MG tablet Take 40 mg by mouth daily. 05/05/21  Yes [provider]  senna-docusate (SENOKOT-S) 8.6-50 MG tablet Take 1 tablet by mouth 2 (two) times daily. 04/05/21  Yes Bradly Bienenstock, NP  bisacodyl (DULCOLAX) 10 MG suppository  Place 1 suppository (10 mg total) rectally daily as needed for moderate constipation. 04/05/21   Bradly Bienenstock, NP  Chlorhexidine Gluconate Cloth 2 % PADS Apply 6 each topically daily. 04/06/21   Bradly Bienenstock, NP  chlorhexidine gluconate, MEDLINE KIT, (PERIDEX) 0.12 % solution 15 mLs by Mouth Rinse route 2 (two) times daily. 04/05/21   Bradly Bienenstock, NP  feeding supplement (ENSURE ENLIVE / ENSURE PLUS) LIQD Take 237 mLs by mouth 3 (three) times daily between meals. 04/05/21   Darel Hong D, NP  ipratropium-albuterol (DUONEB) 0.5-2.5 (3) MG/3ML SOLN Take 3 mLs by nebulization 3 (three) times daily. 04/05/21   Bradly Bienenstock, NP  polyethylene glycol (MIRALAX / GLYCOLAX) 17 g packet Take 17 g by mouth daily. 04/06/21   Bradly Bienenstock, NP  polyvinyl alcohol (LIQUIFILM TEARS) 1.4 % ophthalmic solution Place 1 drop into both eyes as needed for dry eyes. 04/05/21   Bradly Bienenstock, NP    Scheduled Meds:  budesonide (PULMICORT) nebulizer solution  0.5 mg Nebulization BID   chlorhexidine  15 mL Mouth Rinse BID   Chlorhexidine Gluconate Cloth  6 each Topical Q0600   enoxaparin (LOVENOX) injection  0.5 mg/kg Subcutaneous Q24H   insulin aspart  0-15 Units Subcutaneous Q4H   ipratropium-albuterol  3 mL Nebulization Q6H   mouth rinse  15 mL Mouth Rinse q12n4p   [START ON 05/14/2021] predniSONE  20 mg Oral Q breakfast   sertraline  25 mg Oral Daily   Continuous Infusions:  azithromycin Stopped (05/12/21 1039)   famotidine (PEPCID) IV Stopped (05/12/21 2238)   PRN Meds:.acetaminophen, docusate sodium, ipratropium-albuterol, ondansetron (ZOFRAN) IV, oxyCODONE, polyethylene glycol  Resolved Hospital Problem list    ASSESSMENT & PLAN  Acute on Chronic Hypoxic & Hypercapnic Respiratory Failure in the setting of AECOPD PMHx of Obesity Hypoventilation Syndrome, Chronic Tracheostomy -Trial Trach Collar FIO2 40% with Full vent support at night -Wean FiO2 & PEEP as tolerated to maintain O2 sats  >92% -Follow intermittent Chest X-ray & ABG as needed -Implement VAP Bundle -Scheduled & Prn Bronchodilators -IV Solumedrol & Budesonide nebs -Azithromycin for severe COPD Exacerbation   Chronic HFpEF without acute exacerbation -Continuous cardiac monitoring -Maintain MAP >65 -BNP 32.3 -HS Troponin negative x1 (11) -Continue home Lasix as BP and renal function permits -Hold home Metoprolol as BP is soft   Mild Leukocytosis, ?  Pneumonia -Monitor fever curve -Trend WBC's & Procalcitonin -Follow cultures as above -Continue empiric Azithromycin pending cultures & sensitivities -Chest X-ray on 05/10/21 without obvious pneumonia ~ will follow serial CXR's -Urinalysis negative for UTI   Hyperglycemia -CBG's -SSI -Follow ICU Hypo/Hyperglycemia protocol   GERD -IV Pepcid BID   Best practice (right click and "Reselect all SmartList Selections" daily)  Diet:  NPO Pain/Anxiety/Delirium protocol (if indicated): No VAP protocol (if indicated): Yes DVT prophylaxis: Subcutaneous Heparin GI prophylaxis: H2B Glucose control:  SSI No Central venous access:  N/A Arterial line:  N/A Foley:  N/A Mobility:  bed rest  PT consulted: N/A Last date of multidisciplinary goals of care discussion [8/13] Code Status:  full code Disposition: ICU  Critical care time: [place], DNP, FNP-C, AGACNP-BC Acute Care Nurse Practitioner  [facility] Pager: [phone] [place] at [facility]

## 2021-05-13 NOTE — Progress Notes (Signed)
Pt taken off vent & placed on 40% trach collar.  O2 sat is 95% & he is comfortable.

## 2021-05-14 DIAGNOSIS — J9621 Acute and chronic respiratory failure with hypoxia: Secondary | ICD-10-CM | POA: Diagnosis not present

## 2021-05-14 DIAGNOSIS — J9622 Acute and chronic respiratory failure with hypercapnia: Secondary | ICD-10-CM | POA: Diagnosis not present

## 2021-05-14 LAB — CBC WITH DIFFERENTIAL/PLATELET
Abs Immature Granulocytes: 0.07 10*3/uL (ref 0.00–0.07)
Basophils Absolute: 0 10*3/uL (ref 0.0–0.1)
Basophils Relative: 0 %
Eosinophils Absolute: 0 10*3/uL (ref 0.0–0.5)
Eosinophils Relative: 0 %
HCT: 39.1 % (ref 39.0–52.0)
Hemoglobin: 12.6 g/dL — ABNORMAL LOW (ref 13.0–17.0)
Immature Granulocytes: 1 %
Lymphocytes Relative: 14 %
Lymphs Abs: 1.3 10*3/uL (ref 0.7–4.0)
MCH: 32.2 pg (ref 26.0–34.0)
MCHC: 32.2 g/dL (ref 30.0–36.0)
MCV: 100 fL (ref 80.0–100.0)
Monocytes Absolute: 1.4 10*3/uL — ABNORMAL HIGH (ref 0.1–1.0)
Monocytes Relative: 14 %
Neutro Abs: 6.7 10*3/uL (ref 1.7–7.7)
Neutrophils Relative %: 71 %
Platelets: 165 10*3/uL (ref 150–400)
RBC: 3.91 MIL/uL — ABNORMAL LOW (ref 4.22–5.81)
RDW: 13.8 % (ref 11.5–15.5)
WBC: 9.5 10*3/uL (ref 4.0–10.5)
nRBC: 0 % (ref 0.0–0.2)

## 2021-05-14 LAB — BASIC METABOLIC PANEL
Anion gap: 5 (ref 5–15)
BUN: 22 mg/dL (ref 8–23)
CO2: 38 mmol/L — ABNORMAL HIGH (ref 22–32)
Calcium: 8.8 mg/dL — ABNORMAL LOW (ref 8.9–10.3)
Chloride: 95 mmol/L — ABNORMAL LOW (ref 98–111)
Creatinine, Ser: 0.81 mg/dL (ref 0.61–1.24)
GFR, Estimated: >60 mL/min (ref >60)
Glucose, Bld: 97 mg/dL (ref 70–99)
Potassium: 4.3 mmol/L (ref 3.5–5.1)
Sodium: 138 mmol/L (ref 135–145)

## 2021-05-14 LAB — BLOOD GAS, ARTERIAL
Acid-Base Excess: 13.8 mmol/L — ABNORMAL HIGH (ref 0.0–2.0)
Bicarbonate: 39.5 mmol/L — ABNORMAL HIGH (ref 20.0–28.0)
FIO2: 0.4
O2 Saturation: 95.2 %
Patient temperature: 37
pCO2 arterial: 53 mmHg — ABNORMAL HIGH (ref 32.0–48.0)
pH, Arterial: 7.48 — ABNORMAL HIGH (ref 7.350–7.450)
pO2, Arterial: 71 mmHg — ABNORMAL LOW (ref 83.0–108.0)

## 2021-05-14 LAB — GLUCOSE, CAPILLARY
Glucose-Capillary: 104 mg/dL — ABNORMAL HIGH (ref 70–99)
Glucose-Capillary: 118 mg/dL — ABNORMAL HIGH (ref 70–99)
Glucose-Capillary: 149 mg/dL — ABNORMAL HIGH (ref 70–99)
Glucose-Capillary: 198 mg/dL — ABNORMAL HIGH (ref 70–99)
Glucose-Capillary: 130 mg/dL — ABNORMAL HIGH (ref 70–99)

## 2021-05-14 LAB — PHOSPHORUS: Phosphorus: 3.6 mg/dL (ref 2.5–4.6)

## 2021-05-14 LAB — MAGNESIUM: Magnesium: 2.1 mg/dL (ref 1.7–2.4)

## 2021-05-14 MED ORDER — FAMOTIDINE 20 MG PO TABS
20.0000 mg | ORAL_TABLET | Freq: Two times a day (BID) | ORAL | Status: DC
  Administered 2021-05-14 – 2021-05-17 (×7): 20 mg via ORAL
  Filled 2021-05-14 (×7): qty 1

## 2021-05-14 NOTE — Progress Notes (Signed)
NAME:  Eddie Chapman, MRN:  [id number], DOB:  [birth year], LOS: 4 ADMISSION DATE:  05/10/2021, INITIAL CONSULTATION DATE:  05/10/21 REFERRING MD:  Dr. Archie Balboa, CHIEF COMPLAINT:  Shortness of breath   Brief Patient Description  60 year old male tracheostomy dependent with past medical history significant for COPD, obesity hypoventilation syndrome, and HFpEF admitted with acute on chronic hypoxic and hypercapnic respiratory failure in setting of AECOPD requiring mechanical ventilation.  Of note he was recently admitted at [facility] from 04/25/2021 to 05/04/2021 for treatment of acute on chronic hypoxic and hypercapnic respiratory failure due to AE COPD, obesity hypoventilation syndrome and underlying chronic diastolic heart failure.  ED Course: Upon arrival to the ED he was noted to be in respiratory distress with significant tachypnea. On exam he exhibited diffuse rhonchi/wheezing.  ABG was concerning for significant hypercapnia and hypoxia.  He was subsequently placed on mechanical ventilation with noted improvement in respiratory status.  He was also given 125 mg of IV Solu-Medrol, bronchodilators, and azithromycin.   Initial vital signs: Blood pressure 118/60, pulse 30, respiratory rate 35, temperature 98.4, SPO2 97% Labs: Bicarbonate 42, glucose 154, BNP 32.3, high-sensitivity troponin 11, WBC 11.0 with neutrophilia, COVID-19 PCR negative ABG: pH 7.31/PCO2 103/PO2 74/HCO3 51.9 Imaging: Chest x-ray: Stable cardiomegaly. Tracheostomy tube is unchanged in position. Old left rib fractures are noted. No pneumothorax is noted. Possible small left pleural effusion is noted. Right lung is clear.   PCCM is asked to admit the patient to ICU for further work-up and treatment of acute on chronic hypoxic and hypercapnic respiratory failure in setting of AECOPD requiring mechanical ventilation  Pertinent  Medical History  COPD Obesity hypoventilation  syndrome Pneumonia HFpEF Hypertension Hyperlipidemia GERD T8 vertebral fracture Significant Hospital Events: Including procedures, antibiotic start and stop dates in addition to other pertinent events   05/10/2021: Presented to ED with shortness of breath, tracheostomy connected to ventilator in ED, PCCM asked to admit 05/14/21: Tolerating TC during day and vent at night; Palliative Care consulted to assist with GOC  Cultures:  SARS-CoV-2 PCR>> negative Influenza PCR>> negative   Antimicrobials:  Azithromycin 8/11>>  Interim History / Subjective:  -No acute events noted overnight -Overnight rested on vent and tolerated (needs set rate at night) -Afebrile, hemodynamically stable, NO vasopressors -UOP 2L last 24 hrs (net - 1.4 L since admit) -Reports breathing has improved, reports intermittent nonproductive cough -Denies chest pain, SOB, abdominal pain, N/V/D -Pt reversed code status to Full Code ~ Palliative Care consulted to assist with [place] as pt will likely need SNF due to multiple readmissions  OBJECTIVE  Blood pressure 114/81, pulse (!) 30, temperature 98.6 F (37 C), resp. rate 15, height $RemoveBe'5\' 10"'rxvcmUcxK$  (1.778 m), weight (!) 146.9 kg, SpO2 98 %.    Vent Mode: PRVC FiO2 (%):  [40 %] 40 % Set Rate:  [12 bmp] 12 bmp Vt Set:  [550 mL] 550 mL PEEP:  [5 cmH20] 5 cmH20   Intake/Output Summary (Last 24 hours) at 05/14/2021 0804 Last data filed at 05/14/2021 0600 Gross per 24 hour  Intake 1545.92 ml  Output 2050 ml  Net -504.08 ml    Filed Weights   05/10/21 2100 05/13/21 0500 05/14/21 0500  Weight: (!) 142.5 kg (!) 147.8 kg (!) 146.9 kg   Physical Examination: GENERAL: Acute on chronically ill appearing 60 year old patient, sitting in the bed with no acute distress. On Trach collar this morning at 40% FI02 EYES: Pupils equal, round, reactive to light and accommodation. No scleral icterus. Extraocular muscles intact.  HEENT: Head atraumatic, normocephalic. Oropharynx and  nasopharynx clear.  NECK:  Supple, no jugular venous distention. No thyroid enlargement, no tenderness. Tracheostomy midline clean dry and intact LUNGS: Decreased breath sounds bilaterally, mild expiratory wheezing to right lower field, No use of accessory muscles of respiration.  CARDIOVASCULAR:  Tachycardia, regular rhythm, s1s2, No murmurs, rubs, or gallops.  ABDOMEN: Obese, Soft, nontender, nondistended. Bowel sounds present. No organomegaly or mass.  EXTREMITIES: Trace pedal edema bilaterally. No cyanosis, or clubbing.  NEUROLOGIC: Cranial nerves II through XII are intact. Muscle strength 5/5 in all extremities. Sensation intact. Gait not checked.  PSYCHIATRIC: The patient is alert and oriented x 3.  SKIN: Warm and dry.  No obvious rash, lesion, or ulcer.   Labs/imaging that I havepersonally reviewed  (right click and "Reselect all SmartList Selections" daily)    Labs   CBC: Recent Labs  Lab 05/10/21 1515 05/11/21 0436 05/12/21 0528 05/13/21 0426 05/14/21 0511  WBC 11.0* 9.1 16.1* 12.3* 9.5  NEUTROABS 8.7*  --   --   --  6.7  HGB 13.6 12.6* 12.6* 12.2* 12.6*  HCT 42.9 38.4* 39.0 37.3* 39.1  MCV 102.6* 98.7 98.7 100.3* 100.0  PLT 179 176 178 171 165     Basic Metabolic Panel: Recent Labs  Lab 05/10/21 1515 05/11/21 0436 05/12/21 0528 05/13/21 0426 05/14/21 0511  NA 141 139 139 140 138  K 4.6 4.6 3.9 4.1 4.3  CL 91* 93* 93* 95* 95*  CO2 42* 34* 37* 32 38*  GLUCOSE 154* 184* 117* 87 97  BUN 19 30* 39* 28* 22  CREATININE 0.84 1.37* 1.17 0.91 0.81  CALCIUM 8.9 9.0 8.9 8.6* 8.8*  MG  --  2.0 2.0 2.1 2.1  PHOS  --  1.3* 3.8 2.7 3.6    GFR: Estimated Creatinine Clearance: 139 mL/min (by C-G formula based on SCr of 0.81 mg/dL). Recent Labs  Lab 05/10/21 1515 05/11/21 0436 05/12/21 0528 05/13/21 0426 05/14/21 0511  PROCALCITON <0.10  --   --  <0.10  --   WBC 11.0* 9.1 16.1* 12.3* 9.5     Liver Function Tests: No results for input(s): AST, ALT, ALKPHOS,  BILITOT, PROT, ALBUMIN in the last 168 hours. No results for input(s): LIPASE, AMYLASE in the last 168 hours. No results for input(s): AMMONIA in the last 168 hours.  ABG    Component Value Date/Time   PHART 7.57 (H) 05/13/2021 0800   PCO2ART 43 05/13/2021 0800   PO2ART 67 (L) 05/13/2021 0800   HCO3 39.4 (H) 05/13/2021 0800   O2SAT 95.6 05/13/2021 0800      Coagulation Profile: No results for input(s): INR, PROTIME in the last 168 hours.  Cardiac Enzymes: No results for input(s): CKTOTAL, CKMB, CKMBINDEX, TROPONINI in the last 168 hours.  HbA1C: Hgb A1c MFr Bld  Date/Time Value Ref Range Status  04/29/2021 05:02 AM 5.6 4.8 - 5.6 % Final    Comment:    (NOTE) Pre diabetes:          5.7%-6.4%  Diabetes:              >6.4%  Glycemic control for   <7.0% adults with diabetes   03/02/2021 04:52 AM 5.9 (H) 4.8 - 5.6 % Final    Comment:    (NOTE)         Prediabetes: 5.7 - 6.4         Diabetes: >6.4         Glycemic control for adults with diabetes: <7.0  CBG: Recent Labs  Lab 05/13/21 1540 05/13/21 1925 05/13/21 2303 05/14/21 0314 05/14/21 0732  GLUCAP 136* 104* 195* 104* 118*     Allergies Allergies  Allergen Reactions   Amlodipine Swelling    Ankle swelling    Divalproex Sodium Other (See Comments)    Elevated liver enzymes      Home Medications  Prior to Admission medications   Medication Sig Start Date End Date Taking? Authorizing Provider  ARIPiprazole (ABILIFY) 5 MG tablet Take 1 tablet (5 mg total) by mouth daily. 04/06/21  Yes Darel Hong D, NP  ascorbic acid (VITAMIN C) 250 MG tablet Take 1 tablet (250 mg total) by mouth 2 (two) times daily. 04/05/21  Yes Bradly Bienenstock, NP  docusate (COLACE) 50 MG/5ML liquid Place 10 mLs (100 mg total) into feeding tube 2 (two) times daily. 04/05/21  Yes Darel Hong D, NP  famotidine (PEPCID) 20 MG tablet Take 1 tablet (20 mg total) by mouth 2 (two) times daily. 05/04/21 06/03/21 Yes Max Sane, MD   furosemide (LASIX) 40 MG tablet Take 1 tablet (40 mg total) by mouth 2 (two) times daily. 05/04/21 06/03/21 Yes Max Sane, MD  methylPREDNISolone (MEDROL DOSEPAK) 4 MG TBPK tablet Take 4-24 mg by mouth daily at 12 noon. 05/06/21  Yes [provider]  metoprolol tartrate (LOPRESSOR) 25 MG tablet Take 25 mg by mouth daily. 05/05/21  Yes [provider]  oxyCODONE 10 MG TABS Place 1 tablet (10 mg total) into feeding tube every 6 (six) hours as needed for moderate pain. 04/05/21  Yes Darel Hong D, NP  pantoprazole (PROTONIX) 40 MG tablet Take 40 mg by mouth daily. 05/05/21  Yes [provider]  senna-docusate (SENOKOT-S) 8.6-50 MG tablet Take 1 tablet by mouth 2 (two) times daily. 04/05/21  Yes Bradly Bienenstock, NP  bisacodyl (DULCOLAX) 10 MG suppository Place 1 suppository (10 mg total) rectally daily as needed for moderate constipation. 04/05/21   Bradly Bienenstock, NP  Chlorhexidine Gluconate Cloth 2 % PADS Apply 6 each topically daily. 04/06/21   Bradly Bienenstock, NP  chlorhexidine gluconate, MEDLINE KIT, (PERIDEX) 0.12 % solution 15 mLs by Mouth Rinse route 2 (two) times daily. 04/05/21   Bradly Bienenstock, NP  feeding supplement (ENSURE ENLIVE / ENSURE PLUS) LIQD Take 237 mLs by mouth 3 (three) times daily between meals. 04/05/21   Darel Hong D, NP  ipratropium-albuterol (DUONEB) 0.5-2.5 (3) MG/3ML SOLN Take 3 mLs by nebulization 3 (three) times daily. 04/05/21   Bradly Bienenstock, NP  polyethylene glycol (MIRALAX / GLYCOLAX) 17 g packet Take 17 g by mouth daily. 04/06/21   Bradly Bienenstock, NP  polyvinyl alcohol (LIQUIFILM TEARS) 1.4 % ophthalmic solution Place 1 drop into both eyes as needed for dry eyes. 04/05/21   Bradly Bienenstock, NP    Scheduled Meds:  budesonide (PULMICORT) nebulizer solution  0.5 mg Nebulization BID   chlorhexidine  15 mL Mouth Rinse BID   Chlorhexidine Gluconate Cloth  6 each Topical Q0600   enoxaparin (LOVENOX) injection  0.5 mg/kg Subcutaneous Q24H    insulin aspart  0-15 Units Subcutaneous Q4H   ipratropium-albuterol  3 mL Nebulization Q6H   mouth rinse  15 mL Mouth Rinse q12n4p   predniSONE  20 mg Oral Q breakfast   sertraline  25 mg Oral Daily   Continuous Infusions:  azithromycin 500 mg (05/13/21 0956)   famotidine (PEPCID) IV Stopped (05/13/21 2327)   PRN Meds:.acetaminophen, chlorpheniramine-HYDROcodone, docusate sodium, ipratropium-albuterol, ondansetron (ZOFRAN)  IV, oxyCODONE, polyethylene glycol  Resolved Hospital Problem list    ASSESSMENT & PLAN   Acute on Chronic Hypoxic & Hypercapnic Respiratory Failure in the setting of AECOPD PMHx of Obesity Hypoventilation Syndrome, Chronic Tracheostomy -Trial Trach Collar during day, with Full vent support at night -Wean FiO2 & PEEP as tolerated to maintain O2 sats >92% -Follow intermittent Chest X-ray & ABG as needed -Implement VAP Bundle -Scheduled & Prn Bronchodilators -PO Prednisone & Budesonide nebs -Azithromycin for severe COPD Exacerbation (to complete 5 day course on 05/15/21)   Chronic HFpEF without acute exacerbation Tachycardia -Continuous cardiac monitoring -Maintain MAP >65 -BNP 32.3 -HS Troponin negative x1 (11) -Continue home Lasix as BP and renal function permits -Resume home Metoprolol    Mild Leukocytosis>>resolved -Monitor fever curve -Trend WBC's & Procalcitonin -Follow cultures as above -Continue empiric Azithromycin pending cultures & sensitivities (to complete 5 day course, expected end date 05/15/21) -Chest X-ray on 8/11 & 8/13 without obvious pneumonia  -Urinalysis negative for UTI   Hyperglycemia -CBG's -SSI -Follow ICU Hypo/Hyperglycemia protocol   GERD -IV Pepcid BID   Best practice (right click and "Reselect all SmartList Selections" daily)  Diet:  Carb modified diet Pain/Anxiety/Delirium protocol (if indicated): No VAP protocol (if indicated): Yes DVT prophylaxis: Subcutaneous Heparin GI prophylaxis: H2B Glucose control:  SSI  No Central venous access:  N/A Arterial line:  N/A Foley:  N/A Mobility:  OOB as tolerated  PT consulted: N/A Last date of multidisciplinary goals of care discussion [8/15] Code Status:  full code Disposition: ICU  Critical care time: 38 minutes    Darel Hong, AGACNP-BC [facility] Prefer epic messenger for cross cover needs If after hours, please call E-link

## 2021-05-14 NOTE — Progress Notes (Signed)
OT Cancellation Note  Patient Details Name: Eddie Chapman MRN: [id number] DOB: [birth year]   Cancelled Treatment:    Reason Eval/Treat Not Completed: Medical issues which prohibited therapy. Pt with red MEWs score and resting HR in 120's. Per RN report, pt did not tolerate PT session well this morning. OT to hold this afternoon and re-attempt when pt is able to actively participate in OT intervention.   Darleen Crocker, MS, OTR/L , CBIS ascom [phone]  05/14/21, 2:42 PM

## 2021-05-14 NOTE — Progress Notes (Signed)
Physical Therapy Treatment Patient Details Name: Eddie Chapman MRN: [id number] DOB: 02-04-1961 Today's Date: 05/14/2021    History of Present Illness Per MD notes, pt is a 60 y.o. male who presented to ER secondary top progressive SOB; admitted for management of acute/chronic hypoxic hypercarbic respiratory failure due to AECOPD (trach dependant).  Of note, recently admitted at [facility] from 04/25/2021 to 05/04/2021 for similar diagnosis/course; appears patient/significant other may be unable to consistently utilize home vent system. PMH includes COPD, obesity hypoventilation syndrome, and HFpEF.  MD assessment includesacute on chronic hypoxic & hypercapnic respiratory failure in the setting of AECOPD, chronic HFpEF without acute exacerbation, tachycardia, and mild leukocytosis.    PT Comments    Pt was pleasant and motivated to participate during the session. Pt gave good effort throughout session. Pt's HR in the low 100s at rest and gradually progressed to 140s static standing at EOB requiring him to return to bed. Pt able to perform all functional mobility with modified independence-min guard. Pt limited with ambulation and OOB activities secondary to elevated HR. Pt will benefit from further OOB activities next visit as appropriate to address generalized weakness and fatigue. Pt will benefit from PT services in a SNF setting upon discharge to safely address deficits listed in patient problem list for decreased caregiver assistance and eventual return to PLOF.     Follow Up Recommendations  SNF     Equipment Recommendations  None recommended by PT    Recommendations for Other Services       Precautions / Restrictions Precautions Precautions: Fall Precaution Comments: trach collar, PMV Restrictions Weight Bearing Restrictions: No    Mobility  Bed Mobility Overal bed mobility: Modified Independent                  Transfers Overall transfer level: Needs assistance Equipment  used: Rolling walker (2 wheeled) (Bari RW) Transfers: Sit to/from Stand Sit to Stand: Supervision            Ambulation/Gait Ambulation/Gait assistance: Counsellor (Feet): 3 Feet Assistive device: Rolling walker (2 wheeled) Gait Pattern/deviations: Decreased stride length;Wide base of support Gait velocity: decreased   General Gait Details: Pt took sidesteps towards [place]. Pt limited with ambulation secondary to increased HR to 140s standing at EOB.   Stairs             Wheelchair Mobility    Modified Rankin (Stroke Patients Only)       Balance Overall balance assessment: Needs assistance Sitting-balance support: No upper extremity supported;Feet unsupported Sitting balance-Leahy Scale: Good Sitting balance - Comments: Supervision at EOB   Standing balance support: Bilateral upper extremity supported Standing balance-Leahy Scale: Fair Standing balance comment: Heavy reliance on UE support through RW                            Cognition Arousal/Alertness: Awake/alert Behavior During Therapy: WFL for tasks assessed/performed Overall Cognitive Status: Within Functional Limits for tasks assessed                                        Exercises Total Joint Exercises Ankle Circles/Pumps: AROM;Strengthening;Both;10 reps;Supine Heel Slides: AROM;Strengthening;Both;10 reps;Supine Hip ABduction/ADduction: AROM;Strengthening;Both;10 reps;Supine Other Exercises Other Exercises: Static standing 4-5 min using RW with min guard for improved activity tolerance. Other Exercises: Static sitting 2-3 min with supervision for improved trunk control and  balance.    General Comments        Pertinent Vitals/Pain Pain Assessment: 0-10 Pain Score: 7  Pain Location: Chronic thoracic back pain Pain Descriptors / Indicators: Aching;Sore Pain Intervention(s): Monitored during session;Repositioned    Home Living                       Prior Function            PT Goals (current goals can now be found in the care plan section) Progress towards PT goals: Progressing toward goals    Frequency    Min 2X/week      PT Plan Current plan remains appropriate    Co-evaluation              AM-PAC PT "6 Clicks" Mobility   Outcome Measure  Help needed turning from your back to your side while in a flat bed without using bedrails?: None Help needed moving from lying on your back to sitting on the side of a flat bed without using bedrails?: None Help needed moving to and from a bed to a chair (including a wheelchair)?: A Little Help needed standing up from a chair using your arms (e.g., wheelchair or bedside chair)?: A Little Help needed to walk in hospital room?: A Little Help needed climbing 3-5 steps with a railing? : A Lot 6 Click Score: 19    End of Session Equipment Utilized During Treatment: Oxygen;Gait belt;Other (comment) (via trach mask @ 6L) Activity Tolerance: Other (comment) (Limited by elevated HR) Patient left: with call bell/phone within reach;with nursing/sitter in room;in bed;with family/visitor present Nurse Communication: Mobility status; elevated HR PT Visit Diagnosis: Muscle weakness (generalized) (M62.81);Difficulty in walking, not elsewhere classified (R26.2);Unsteadiness on feet (R26.81)     Time: [id number] PT Time Calculation (min) (ACUTE ONLY): 26 min  Charges:                        [name] SPT 05/14/21, 1:52 PM

## 2021-05-14 NOTE — Consult Note (Signed)
PHARMACY CONSULT NOTE - FOLLOW UP  Pharmacy Consult for Electrolyte Monitoring and Replacement   Recent Labs: Potassium (mmol/L)  Date Value  05/14/2021 4.3  05/04/2014 4.3   Magnesium (mg/dL)  Date Value  05/14/2021 2.1   Calcium (mg/dL)  Date Value  05/14/2021 8.8 (L)   Calcium, Total (mg/dL)  Date Value  05/04/2014 9.1   Albumin (g/dL)  Date Value  04/25/2021 3.4 (L)   Phosphorus (mg/dL)  Date Value  05/14/2021 3.6   Sodium (mmol/L)  Date Value  05/14/2021 138  05/04/2014 140     Assessment: 60 y.o. male who presents to the emergency department today because of concern for shortness of breath. Hx of HFpEF, COPD,tracheostomy placed 02/2021. Pharmacy has been consulted to monitor and replace electrolytes as needed.   Goal of Therapy:  Electrolytes WNL  Plan:  No replacement required today.  Electrolytes stable. Follow as needed  Tawnya Crook ,PharmD, BCPS Clinical Pharmacist 05/14/2021 11:35 AM

## 2021-05-14 NOTE — TOC Progression Note (Signed)
Transition of Care [facility]) - Progression Note    Patient Details  Name: Eddie Chapman MRN: [id number] Date of Birth: March 19, 1961  Transition of Care [facility]) CM/SW [place], [place] Phone Number: [phone] 05/14/2021, 10:35 AM  Clinical Narrative:     Received update from [place] at [facility] DME on patient Trilogy usage from 8/05-8/11.  CSW forwarded email to Attending a ICU NP.  Attending stated he will discuss long term placement with patient and he will update CSW so I can speak with patient about placement process if the patient is amenable. Baum,[name] (Significant other) [phone] (Mobile) primary care giver and contact.    Expected Discharge Plan: [place] Barriers to Discharge: Continued Medical Work up  Expected Discharge Plan and Services Expected Discharge Plan: [place]   Discharge Planning Services: CM Consult Post Acute Care Choice: Durable Medical Equipment, Home Health Living arrangements for the past 2 months: Single Family Home                   DME Agency: [employer]                   Social Determinants of Health (SDOH) Interventions    Readmission Risk Interventions No flowsheet data found.

## 2021-05-15 ENCOUNTER — Inpatient Hospital Stay: Payer: PPO

## 2021-05-15 DIAGNOSIS — I110 Hypertensive heart disease with heart failure: Secondary | ICD-10-CM | POA: Diagnosis not present

## 2021-05-15 DIAGNOSIS — I272 Pulmonary hypertension, unspecified: Secondary | ICD-10-CM | POA: Diagnosis not present

## 2021-05-15 DIAGNOSIS — J9611 Chronic respiratory failure with hypoxia: Secondary | ICD-10-CM | POA: Diagnosis not present

## 2021-05-15 DIAGNOSIS — Z7189 Other specified counseling: Secondary | ICD-10-CM

## 2021-05-15 DIAGNOSIS — J9601 Acute respiratory failure with hypoxia: Secondary | ICD-10-CM | POA: Diagnosis not present

## 2021-05-15 DIAGNOSIS — J9612 Chronic respiratory failure with hypercapnia: Secondary | ICD-10-CM | POA: Diagnosis not present

## 2021-05-15 DIAGNOSIS — I5022 Chronic systolic (congestive) heart failure: Secondary | ICD-10-CM | POA: Diagnosis not present

## 2021-05-15 DIAGNOSIS — J9602 Acute respiratory failure with hypercapnia: Secondary | ICD-10-CM | POA: Diagnosis not present

## 2021-05-15 DIAGNOSIS — J9621 Acute and chronic respiratory failure with hypoxia: Secondary | ICD-10-CM | POA: Diagnosis not present

## 2021-05-15 DIAGNOSIS — J449 Chronic obstructive pulmonary disease, unspecified: Secondary | ICD-10-CM | POA: Diagnosis not present

## 2021-05-15 DIAGNOSIS — J9622 Acute and chronic respiratory failure with hypercapnia: Secondary | ICD-10-CM | POA: Diagnosis not present

## 2021-05-15 DIAGNOSIS — Z430 Encounter for attention to tracheostomy: Secondary | ICD-10-CM | POA: Diagnosis not present

## 2021-05-15 LAB — BASIC METABOLIC PANEL
Anion gap: 11 (ref 5–15)
BUN: 22 mg/dL (ref 8–23)
CO2: 32 mmol/L (ref 22–32)
Calcium: 8.8 mg/dL — ABNORMAL LOW (ref 8.9–10.3)
Chloride: 95 mmol/L — ABNORMAL LOW (ref 98–111)
Creatinine, Ser: 0.87 mg/dL (ref 0.61–1.24)
GFR, Estimated: >60 mL/min (ref >60)
Glucose, Bld: 100 mg/dL — ABNORMAL HIGH (ref 70–99)
Potassium: 4.4 mmol/L (ref 3.5–5.1)
Sodium: 138 mmol/L (ref 135–145)

## 2021-05-15 LAB — CBC
HCT: 40.2 % (ref 39.0–52.0)
Hemoglobin: 12.8 g/dL — ABNORMAL LOW (ref 13.0–17.0)
MCH: 31.4 pg (ref 26.0–34.0)
MCHC: 31.8 g/dL (ref 30.0–36.0)
MCV: 98.5 fL (ref 80.0–100.0)
Platelets: 157 10*3/uL (ref 150–400)
RBC: 4.08 MIL/uL — ABNORMAL LOW (ref 4.22–5.81)
RDW: 13.4 % (ref 11.5–15.5)
WBC: 9.6 10*3/uL (ref 4.0–10.5)
nRBC: 0 % (ref 0.0–0.2)

## 2021-05-15 LAB — GLUCOSE, CAPILLARY
Glucose-Capillary: 132 mg/dL — ABNORMAL HIGH (ref 70–99)
Glucose-Capillary: 138 mg/dL — ABNORMAL HIGH (ref 70–99)
Glucose-Capillary: 170 mg/dL — ABNORMAL HIGH (ref 70–99)
Glucose-Capillary: 170 mg/dL — ABNORMAL HIGH (ref 70–99)
Glucose-Capillary: 170 mg/dL — ABNORMAL HIGH (ref 70–99)
Glucose-Capillary: 179 mg/dL — ABNORMAL HIGH (ref 70–99)
Glucose-Capillary: 96 mg/dL (ref 70–99)

## 2021-05-15 LAB — PHOSPHORUS: Phosphorus: 3.9 mg/dL (ref 2.5–4.6)

## 2021-05-15 LAB — MAGNESIUM: Magnesium: 2.2 mg/dL (ref 1.7–2.4)

## 2021-05-15 MED ORDER — METOPROLOL TARTRATE 25 MG PO TABS
25.0000 mg | ORAL_TABLET | Freq: Every day | ORAL | Status: DC
  Administered 2021-05-15 – 2021-05-17 (×3): 25 mg via ORAL
  Filled 2021-05-15 (×3): qty 1

## 2021-05-15 MED ORDER — FUROSEMIDE 20 MG PO TABS
40.0000 mg | ORAL_TABLET | Freq: Every day | ORAL | Status: DC
  Administered 2021-05-15 – 2021-05-17 (×3): 40 mg via ORAL
  Filled 2021-05-15 (×3): qty 2

## 2021-05-15 NOTE — Consult Note (Addendum)
Consultation Note Date: 05/15/2021   Patient Name: Eddie Chapman  DOB: 1961-07-23  MRN: [id number]  Age / Sex: 60 y.o., male  PCP: Adin Hector, MD Referring Physician: Flora Lipps, MD  Reason for Consultation: Establishing goals of care  HPI/Patient Profile: 60 year old male tracheostomy dependent with past medical history significant for COPD, obesity hypoventilation syndrome, and HFpEF admitted with acute on chronic hypoxic and hypercapnic respiratory failure in setting of AECOPD requiring mechanical ventilation.   Of note he was recently admitted at [facility] from 04/25/2021 to 05/04/2021 for treatment of acute on chronic hypoxic and hypercapnic respiratory failure due to AE COPD, obesity hypoventilation syndrome and underlying chronic diastolic heart failure.   Clinical Assessment and Goals of Care: Patient is in bed with another discipline at bedside. No family at bedside. Spoke with girlfriend. She states they have been together for 8 years but are not married. She confirms current documentation that she would be HPOA for patient. She states he has no children and she is alone providing care.   She states this has all been very hard on her. She states she wants him home but really wants him to go back to [facility] to see if they can wean him from the ventilator, and the trach in general. She states he was initially advised if he would allow the trach, the medical team could promise to get him off the vent and have the trach removed, he just needed more time.  She states at home, he does not use the ventilator very much because he cannot sleep, and sits and watches t.v. all hours with his trach collar. She states he has not made efforts to lose weight as he was advised to, to assist with vent weaning and  trach removal process. She assists with ADL's  She requests that this conversation be private and I will mark  the note not to be shared. Will plan to meet with them at bedside at 10:30 tomorrow morning.     SUMMARY OF RECOMMENDATIONS   Will plan to meet at 10:30 tomorrow morning at bedside.       Primary Diagnoses: Present on Admission:  Acute on chronic respiratory failure with hypoxia and hypercapnia ([facility])   I have reviewed the medical record, interviewed the patient and family, and examined the patient. The following aspects are pertinent.  Past Medical History:  Diagnosis Date   Adenomatous colon polyp    Collapsed lung 08/2020   left   Depression    Ear drum perforation    left x2    GERD (gastroesophageal reflux disease)    Hyperlipidemia    Hypertension    Pneumonia    T8 vertebral fracture (HCC)    Social History   Socioeconomic History   Marital status: Single    Spouse name: Not on file   Number of children: Not on file   Years of education: Not on file   Highest education level: Not on file  Occupational History   Not on file  Tobacco Use   Smoking status: Never   Smokeless tobacco: Never  Vaping Use   Vaping Use: Never used  Substance and Sexual Activity   Alcohol use: Never   Drug use: Never   Sexual activity: Not Currently    Partners: Female    Birth control/protection: None  Other Topics Concern   Not on file  Social History Narrative   Not on file   Social Determinants of Health   Financial Resource Strain: Not on file  Food Insecurity: Not on file  Transportation Needs: Not on file  Physical Activity: Not on file  Stress: Not on file  Social Connections: Not on file   Family History  Problem Relation Age of Onset   Cancer Mother    Varicose Veins Mother    Scheduled Meds:  budesonide (PULMICORT) nebulizer solution  0.5 mg Nebulization BID   chlorhexidine  15 mL Mouth Rinse BID   Chlorhexidine Gluconate Cloth  6 each Topical Q0600   enoxaparin (LOVENOX) injection  0.5 mg/kg Subcutaneous Q24H   famotidine  20 mg Oral BID   furosemide   40 mg Oral Daily   insulin aspart  0-15 Units Subcutaneous Q4H   ipratropium-albuterol  3 mL Nebulization Q6H   mouth rinse  15 mL Mouth Rinse q12n4p   metoprolol tartrate  25 mg Oral Daily   sertraline  25 mg Oral Daily   Continuous Infusions: PRN Meds:.acetaminophen, chlorpheniramine-HYDROcodone, docusate sodium, ipratropium-albuterol, ondansetron (ZOFRAN) IV, oxyCODONE, polyethylene glycol Medications Prior to Admission:  Prior to Admission medications   Medication Sig Start Date End Date Taking? Authorizing Provider  ARIPiprazole (ABILIFY) 5 MG tablet Take 1 tablet (5 mg total) by mouth daily. 04/06/21  Yes Darel Hong D, NP  ascorbic acid (VITAMIN C) 250 MG tablet Take 1 tablet (250 mg total) by mouth 2 (two) times daily. 04/05/21  Yes Bradly Bienenstock, NP  docusate (COLACE) 50 MG/5ML liquid Place 10 mLs (100 mg total) into feeding tube 2 (two) times daily. 04/05/21  Yes Darel Hong D, NP  famotidine (PEPCID) 20 MG tablet Take 1 tablet (20 mg total) by mouth 2 (two) times daily. 05/04/21 06/03/21 Yes Max Sane, MD  furosemide (LASIX) 40 MG tablet Take 1 tablet (40 mg total) by mouth 2 (two) times daily. 05/04/21 06/03/21 Yes Max Sane, MD  methylPREDNISolone (MEDROL DOSEPAK) 4 MG TBPK tablet Take 4-24 mg by mouth daily at 12 noon. 05/06/21  Yes [provider]  metoprolol tartrate (LOPRESSOR) 25 MG tablet Take 25 mg by mouth daily. 05/05/21  Yes [provider]  oxyCODONE 10 MG TABS Place 1 tablet (10 mg total) into feeding tube every 6 (six) hours as needed for moderate pain. 04/05/21  Yes Darel Hong D, NP  pantoprazole (PROTONIX) 40 MG tablet Take 40 mg by mouth daily. 05/05/21  Yes [provider]  senna-docusate (SENOKOT-S) 8.6-50 MG tablet Take 1 tablet by mouth 2 (two) times daily. 04/05/21  Yes Bradly Bienenstock, NP  bisacodyl (DULCOLAX) 10 MG suppository Place 1 suppository (10 mg total) rectally daily as needed for moderate constipation. 04/05/21   Bradly Bienenstock, NP  Chlorhexidine Gluconate Cloth 2 % PADS Apply 6 each topically daily. 04/06/21   Bradly Bienenstock, NP  chlorhexidine gluconate, MEDLINE KIT, (PERIDEX) 0.12 % solution 15 mLs by Mouth Rinse route 2 (two) times daily. 04/05/21   Bradly Bienenstock, NP  feeding supplement (ENSURE ENLIVE / ENSURE PLUS) LIQD Take 237 mLs by mouth 3 (three) times daily  between meals. 04/05/21   Darel Hong D, NP  ipratropium-albuterol (DUONEB) 0.5-2.5 (3) MG/3ML SOLN Take 3 mLs by nebulization 3 (three) times daily. 04/05/21   Bradly Bienenstock, NP  polyethylene glycol (MIRALAX / GLYCOLAX) 17 g packet Take 17 g by mouth daily. 04/06/21   Bradly Bienenstock, NP  polyvinyl alcohol (LIQUIFILM TEARS) 1.4 % ophthalmic solution Place 1 drop into both eyes as needed for dry eyes. 04/05/21   Bradly Bienenstock, NP   Allergies  Allergen Reactions   Amlodipine Swelling    Ankle swelling    Divalproex Sodium Other (See Comments)    Elevated liver enzymes   Review of Systems  All other systems reviewed and are negative.  Physical Exam Pulmonary:     Effort: Pulmonary effort is normal.  Neurological:     Mental Status: He is alert.    Vital Signs: BP 101/65   Pulse 89   Temp 99.1 F (37.3 C)   Resp (!) 25   Ht '5\' 10"'  (1.778 m)   Wt (!) 147.4 kg   SpO2 (!) 88%   BMI 46.63 kg/m  Pain Scale: 0-10   Pain Score: 1    SpO2: SpO2: (!) 88 % O2 Device:SpO2: (!) 88 % O2 Flow Rate: .O2 Flow Rate (L/min): 10 L/min  IO: Intake/output summary:  Intake/Output Summary (Last 24 hours) at 05/15/2021 1514 Last data filed at 05/15/2021 1300 Gross per 24 hour  Intake 1320 ml  Output 4200 ml  Net -2880 ml    LBM: Last BM Date: 05/14/21 Baseline Weight: Weight: (!) 149.9 kg Most recent weight: Weight: (!) 147.4 kg       Time In: 3:15 Time Out: 3:45 Time Total: 30 min Greater than 50%  of this time was spent counseling and coordinating care related to the above assessment and plan.  Signed by: Asencion Gowda, NP   Please contact Palliative Medicine Team phone at [phone] for questions and concerns.  For individual provider: See Shea Evans

## 2021-05-15 NOTE — Progress Notes (Signed)
Occupational Therapy Treatment Patient Details Name: Eddie Chapman MRN: [id number] DOB: 1961/08/08 Today's Date: 05/15/2021    History of present illness Per MD notes, pt is a 60 y.o. male who presented to ER secondary top progressive SOB; admitted for management of acute/chronic hypoxic hypercarbic respiratory failure due to AECOPD (trach dependant).  Of note, recently admitted at [facility] from 04/25/2021 to 05/04/2021 for similar diagnosis/course; appears patient/significant other may be unable to consistently utilize home vent system. PMH includes COPD, obesity hypoventilation syndrome, and HFpEF.  MD assessment includesacute on chronic hypoxic & hypercapnic respiratory failure in the setting of AECOPD, chronic HFpEF without acute exacerbation, tachycardia, and mild leukocytosis.   OT comments  Eddie Chapman seen for OT treatment on this date. Upon arrival to room pt reclined in bed, agreeable to OOB to chair. Pt requires SETUP & SBA face washing seated EOB c MIN cues for thoroughness. MIN A + HHA for ADL t/f, pt demos poor insight into deficits and poor safety awareness. Pt making progress toward goals. Pt continues to benefit from skilled OT services to maximize return to PLOF and minimize risk of future falls, injury, caregiver burden, and readmission. Will continue to follow POC. Discharge recommendation remains appropriate.    Follow Up Recommendations  SNF    Equipment Recommendations  Other (comment) (TBD)    Recommendations for Other Services      Precautions / Restrictions Precautions Precautions: Fall Precaution Comments: trach collar, PMV Restrictions Weight Bearing Restrictions: No       Mobility Bed Mobility Overal bed mobility: Modified Independent                  Transfers Overall transfer level: Needs assistance Equipment used: 1 person hand held assist Transfers: Sit to/from Stand;Stand Pivot Transfers Sit to Stand: Min guard Stand pivot transfers: Min assist        General transfer comment: assist for managing lines/leads - pt demos poor safety awareness    Balance Overall balance assessment: Needs assistance Sitting-balance support: No upper extremity supported;Feet unsupported Sitting balance-Leahy Scale: Good     Standing balance support: Bilateral upper extremity supported;During functional activity Standing balance-Leahy Scale: Fair                             ADL either performed or assessed with clinical judgement   ADL Overall ADL's : Needs assistance/impaired                                       General ADL Comments: SETUP & SBA face washing seated EOB c MIN cues for thoroughness. MIN A + HHA for ADL t/f.      Cognition Arousal/Alertness: Awake/alert Behavior During Therapy: WFL for tasks assessed/performed Overall Cognitive Status: History of cognitive impairments - at baseline                                          Exercises Exercises: Other exercises Other Exercises Other Exercises: Pt educated re: DME recs, d/c recs, falls prevention, ECS, HEP Other Exercises: LBD, UBD, grooming, sup>sit, sit<>stand, sitting/standing balance/tolerance, SPT   Shoulder Instructions       General Comments HR 105 during mobility, SpO2 87-91 on trach collar, RR 20s at rest, increased to 35-40 with mobility  Pertinent Vitals/ Pain       Pain Assessment: No/denies pain   Frequency  Min 2X/week        Progress Toward Goals  OT Goals(current goals can now be found in the care plan section)  Progress towards OT goals: Progressing toward goals  Acute Rehab OT Goals Patient Stated Goal: to return home OT Goal Formulation: With patient Time For Goal Achievement: 05/25/21 Potential to Achieve Goals: Good ADL Goals Pt Will Perform Grooming: with modified independence;standing Pt Will Perform Lower Body Dressing: with modified independence;sit to/from stand Pt Will Transfer to  Toilet: with modified independence;regular height toilet Pt Will Perform Toileting - Clothing Manipulation and hygiene: with supervision;sit to/from stand;with adaptive equipment  Plan Discharge plan remains appropriate;Frequency remains appropriate    Co-evaluation                 AM-PAC OT "6 Clicks" Daily Activity     Outcome Measure   Help from another person eating meals?: None Help from another person taking care of personal grooming?: A Little Help from another person toileting, which includes using toliet, bedpan, or urinal?: A Lot Help from another person bathing (including washing, rinsing, drying)?: A Little Help from another person to put on and taking off regular upper body clothing?: A Little Help from another person to put on and taking off regular lower body clothing?: A Little 6 Click Score: 18    End of Session Equipment Utilized During Treatment: Oxygen  OT Visit Diagnosis: Other abnormalities of gait and mobility (R26.89);Muscle weakness (generalized) (M62.81);Other symptoms and signs involving cognitive function   Activity Tolerance Patient tolerated treatment well   Patient Left in chair;with call bell/phone within reach;with family/visitor present   Nurse Communication Mobility status        Time: [id number] OT Time Calculation (min): 23 min  Charges: OT General Charges $OT Visit: 1 Visit OT Treatments $Self Care/Home Management : 23-37 mins  Dessie Coma, M.S. OTR/L  05/15/21, 4:38 PM  ascom [phone]

## 2021-05-15 NOTE — Consult Note (Signed)
PHARMACY CONSULT NOTE - FOLLOW UP  Pharmacy Consult for Electrolyte Monitoring and Replacement   Recent Labs: Potassium (mmol/L)  Date Value  05/15/2021 4.4  05/04/2014 4.3   Magnesium (mg/dL)  Date Value  05/15/2021 2.2   Calcium (mg/dL)  Date Value  05/15/2021 8.8 (L)   Calcium, Total (mg/dL)  Date Value  05/04/2014 9.1   Albumin (g/dL)  Date Value  04/25/2021 3.4 (L)   Phosphorus (mg/dL)  Date Value  05/15/2021 3.9   Sodium (mmol/L)  Date Value  05/15/2021 138  05/04/2014 140     Assessment: 60 y.o. male who presents to the emergency department today because of concern for shortness of breath. Hx of HFpEF, COPD,tracheostomy placed 02/2021. Pharmacy has been consulted to monitor and replace electrolytes as needed.   Goal of Therapy:  Electrolytes WNL  Plan:  No replacement required today.  Electrolytes stable. Follow as needed  Tawnya Crook ,PharmD, BCPS Clinical Pharmacist 05/15/2021 11:18 AM

## 2021-05-15 NOTE — Progress Notes (Deleted)
Video connection link was texted to participant via mobile number provided. Connection was successful.

## 2021-05-15 NOTE — Progress Notes (Signed)
NAME:  Eddie Chapman, MRN:  [id number], DOB:  1961/07/25, LOS: 5 ADMISSION DATE:  05/10/2021, INITIAL CONSULTATION DATE:  05/10/21 REFERRING MD:  Dr. Archie Balboa, CHIEF COMPLAINT:  Shortness of breath   Brief Patient Description  60 year old male tracheostomy dependent with past medical history significant for COPD, obesity hypoventilation syndrome, and HFpEF admitted with acute on chronic hypoxic and hypercapnic respiratory failure in setting of AECOPD requiring mechanical ventilation.  Of note he was recently admitted at [facility] from 04/25/2021 to 05/04/2021 for treatment of acute on chronic hypoxic and hypercapnic respiratory failure due to AE COPD, obesity hypoventilation syndrome and underlying chronic diastolic heart failure.  ED Course: Upon arrival to the ED he was noted to be in respiratory distress with significant tachypnea. On exam he exhibited diffuse rhonchi/wheezing.  ABG was concerning for significant hypercapnia and hypoxia.  He was subsequently placed on mechanical ventilation with noted improvement in respiratory status.  He was also given 125 mg of IV Solu-Medrol, bronchodilators, and azithromycin.   Initial vital signs: Blood pressure 118/60, pulse 30, respiratory rate 35, temperature 98.4, SPO2 97% Labs: Bicarbonate 42, glucose 154, BNP 32.3, high-sensitivity troponin 11, WBC 11.0 with neutrophilia, COVID-19 PCR negative ABG: pH 7.31/PCO2 103/PO2 74/HCO3 51.9 Imaging: Chest x-ray: Stable cardiomegaly. Tracheostomy tube is unchanged in position. Old left rib fractures are noted. No pneumothorax is noted. Possible small left pleural effusion is noted. Right lung is clear.   PCCM is asked to admit the patient to ICU for further work-up and treatment of acute on chronic hypoxic and hypercapnic respiratory failure in setting of AECOPD requiring mechanical ventilation  Pertinent  Medical History  COPD Obesity hypoventilation  syndrome Pneumonia HFpEF Hypertension Hyperlipidemia GERD T8 vertebral fracture Significant Hospital Events: Including procedures, antibiotic start and stop dates in addition to other pertinent events   05/10/2021: Presented to ED with shortness of breath, tracheostomy connected to ventilator in ED, PCCM asked to admit 05/14/21: Tolerating TC during day and vent at night; Palliative Care consulted to assist with [place] 05/15/21: Tolerating TC.  Restarting home Metoprolol and home Lasix.  TOC team working on discharge disposition.  Cultures:  SARS-CoV-2 PCR>> negative Influenza PCR>> negative   Antimicrobials:  Azithromycin 8/11>>8/15  Interim History / Subjective:  -No acute events noted overnight -Afebrile, hemodynamically stable, NO vasopressors -UOP 3.75L last 24 hrs (net - 3.8 L since admit) -Plan to taper Prednisone -Will restart home Lasix and Metoprolol -TOC team working on discharge disposition   OBJECTIVE  Blood pressure 134/82, pulse 96, temperature 98.6 F (37 C), resp. rate (!) 29, height 5' 10" (1.778 m), weight (!) 147.4 kg, SpO2 93 %.    Vent Mode: PRVC FiO2 (%):  [40 %] 40 % Set Rate:  [12 bmp] 12 bmp Vt Set:  [550 mL] 550 mL PEEP:  [5 cmH20] 5 cmH20 Plateau Pressure:  [17 cmH20-18 cmH20] 18 cmH20   Intake/Output Summary (Last 24 hours) at 05/15/2021 0807 Last data filed at 05/15/2021 4462 Gross per 24 hour  Intake 1320 ml  Output 3750 ml  Net -2430 ml    Filed Weights   05/13/21 0500 05/14/21 0500 05/15/21 0500  Weight: (!) 147.8 kg (!) 146.9 kg (!) 147.4 kg   Physical Examination: GENERAL: Acute on chronically ill appearing 60 year old patient, sitting in the bed with no acute distress. On Trach collar this morning at 40% FI02 EYES: Pupils equal, round, reactive to light and accommodation. No scleral icterus. Extraocular muscles intact.  HEENT: Head atraumatic, normocephalic. Oropharynx and nasopharynx clear.  NECK:  Supple, no jugular venous  distention. No thyroid enlargement, no tenderness. Tracheostomy midline clean dry and intact LUNGS: Clear breath sounds bilaterally, no wheezing, even, No use of accessory muscles of respiration.  CARDIOVASCULAR:  Tachycardia, regular rhythm, s1s2, No murmurs, rubs, or gallops.  ABDOMEN: Obese, Soft, nontender, nondistended. Bowel sounds present. No organomegaly or mass.  EXTREMITIES: Trace edema bilateral LE. No cyanosis, or clubbing.  NEUROLOGIC: Cranial nerves II through XII are intact. Muscle strength 5/5 in all extremities. Sensation intact. Gait not checked.  PSYCHIATRIC: The patient is alert and oriented x 3.  SKIN: Warm and dry.  No obvious rash, lesion, or ulcer.   Labs/imaging that I havepersonally reviewed  (right click and "Reselect all SmartList Selections" daily)    Labs   CBC: Recent Labs  Lab 05/10/21 1515 05/11/21 0436 05/12/21 0528 05/13/21 0426 05/14/21 0511 05/15/21 0512  WBC 11.0* 9.1 16.1* 12.3* 9.5 9.6  NEUTROABS 8.7*  --   --   --  6.7  --   HGB 13.6 12.6* 12.6* 12.2* 12.6* 12.8*  HCT 42.9 38.4* 39.0 37.3* 39.1 40.2  MCV 102.6* 98.7 98.7 100.3* 100.0 98.5  PLT 179 176 178 171 165 157     Basic Metabolic Panel: Recent Labs  Lab 05/11/21 0436 05/12/21 0528 05/13/21 0426 05/14/21 0511 05/15/21 0512  NA 139 139 140 138 138  K 4.6 3.9 4.1 4.3 4.4  CL 93* 93* 95* 95* 95*  CO2 34* 37* 32 38* 32  GLUCOSE 184* 117* 87 97 100*  BUN 30* 39* 28* 22 22  CREATININE 1.37* 1.17 0.91 0.81 0.87  CALCIUM 9.0 8.9 8.6* 8.8* 8.8*  MG 2.0 2.0 2.1 2.1 2.2  PHOS 1.3* 3.8 2.7 3.6 3.9    GFR: Estimated Creatinine Clearance: 129.6 mL/min (by C-G formula based on SCr of 0.87 mg/dL). Recent Labs  Lab 05/10/21 1515 05/11/21 0436 05/12/21 0528 05/13/21 0426 05/14/21 0511 05/15/21 0512  PROCALCITON <0.10  --   --  <0.10  --   --   WBC 11.0*   < > 16.1* 12.3* 9.5 9.6   < > = values in this interval not displayed.     Liver Function Tests: No results for  input(s): AST, ALT, ALKPHOS, BILITOT, PROT, ALBUMIN in the last 168 hours. No results for input(s): LIPASE, AMYLASE in the last 168 hours. No results for input(s): AMMONIA in the last 168 hours.  ABG    Component Value Date/Time   PHART 7.48 (H) 05/14/2021 0831   PCO2ART 53 (H) 05/14/2021 0831   PO2ART 71 (L) 05/14/2021 0831   HCO3 39.5 (H) 05/14/2021 0831   O2SAT 95.2 05/14/2021 0831      Coagulation Profile: No results for input(s): INR, PROTIME in the last 168 hours.  Cardiac Enzymes: No results for input(s): CKTOTAL, CKMB, CKMBINDEX, TROPONINI in the last 168 hours.  HbA1C: Hgb A1c MFr Bld  Date/Time Value Ref Range Status  04/29/2021 05:02 AM 5.6 4.8 - 5.6 % Final    Comment:    (NOTE) Pre diabetes:          5.7%-6.4%  Diabetes:              >6.4%  Glycemic control for   <7.0% adults with diabetes   03/02/2021 04:52 AM 5.9 (H) 4.8 - 5.6 % Final    Comment:    (NOTE)         Prediabetes: 5.7 - 6.4         Diabetes: >6.4  Glycemic control for adults with diabetes: <7.0     CBG: Recent Labs  Lab 05/14/21 1518 05/14/21 2032 05/15/21 0007 05/15/21 0348 05/15/21 0727  GLUCAP 198* 130* 170* 96 138*     Allergies Allergies  Allergen Reactions   Amlodipine Swelling    Ankle swelling    Divalproex Sodium Other (See Comments)    Elevated liver enzymes      Home Medications  Prior to Admission medications   Medication Sig Start Date End Date Taking? Authorizing Provider  ARIPiprazole (ABILIFY) 5 MG tablet Take 1 tablet (5 mg total) by mouth daily. 04/06/21  Yes Darel Hong D, NP  ascorbic acid (VITAMIN C) 250 MG tablet Take 1 tablet (250 mg total) by mouth 2 (two) times daily. 04/05/21  Yes Bradly Bienenstock, NP  docusate (COLACE) 50 MG/5ML liquid Place 10 mLs (100 mg total) into feeding tube 2 (two) times daily. 04/05/21  Yes Darel Hong D, NP  famotidine (PEPCID) 20 MG tablet Take 1 tablet (20 mg total) by mouth 2 (two) times daily. 05/04/21  06/03/21 Yes Max Sane, MD  furosemide (LASIX) 40 MG tablet Take 1 tablet (40 mg total) by mouth 2 (two) times daily. 05/04/21 06/03/21 Yes Max Sane, MD  methylPREDNISolone (MEDROL DOSEPAK) 4 MG TBPK tablet Take 4-24 mg by mouth daily at 12 noon. 05/06/21  Yes [provider]  metoprolol tartrate (LOPRESSOR) 25 MG tablet Take 25 mg by mouth daily. 05/05/21  Yes [provider]  oxyCODONE 10 MG TABS Place 1 tablet (10 mg total) into feeding tube every 6 (six) hours as needed for moderate pain. 04/05/21  Yes Darel Hong D, NP  pantoprazole (PROTONIX) 40 MG tablet Take 40 mg by mouth daily. 05/05/21  Yes [provider]  senna-docusate (SENOKOT-S) 8.6-50 MG tablet Take 1 tablet by mouth 2 (two) times daily. 04/05/21  Yes Bradly Bienenstock, NP  bisacodyl (DULCOLAX) 10 MG suppository Place 1 suppository (10 mg total) rectally daily as needed for moderate constipation. 04/05/21   Bradly Bienenstock, NP  Chlorhexidine Gluconate Cloth 2 % PADS Apply 6 each topically daily. 04/06/21   Bradly Bienenstock, NP  chlorhexidine gluconate, MEDLINE KIT, (PERIDEX) 0.12 % solution 15 mLs by Mouth Rinse route 2 (two) times daily. 04/05/21   Bradly Bienenstock, NP  feeding supplement (ENSURE ENLIVE / ENSURE PLUS) LIQD Take 237 mLs by mouth 3 (three) times daily between meals. 04/05/21   Darel Hong D, NP  ipratropium-albuterol (DUONEB) 0.5-2.5 (3) MG/3ML SOLN Take 3 mLs by nebulization 3 (three) times daily. 04/05/21   Bradly Bienenstock, NP  polyethylene glycol (MIRALAX / GLYCOLAX) 17 g packet Take 17 g by mouth daily. 04/06/21   Bradly Bienenstock, NP  polyvinyl alcohol (LIQUIFILM TEARS) 1.4 % ophthalmic solution Place 1 drop into both eyes as needed for dry eyes. 04/05/21   Bradly Bienenstock, NP    Scheduled Meds:  budesonide (PULMICORT) nebulizer solution  0.5 mg Nebulization BID   chlorhexidine  15 mL Mouth Rinse BID   Chlorhexidine Gluconate Cloth  6 each Topical Q0600   enoxaparin (LOVENOX) injection  0.5  mg/kg Subcutaneous Q24H   famotidine  20 mg Oral BID   insulin aspart  0-15 Units Subcutaneous Q4H   ipratropium-albuterol  3 mL Nebulization Q6H   mouth rinse  15 mL Mouth Rinse q12n4p   predniSONE  20 mg Oral Q breakfast   sertraline  25 mg Oral Daily   Continuous Infusions:   PRN Meds:.acetaminophen, chlorpheniramine-HYDROcodone,  docusate sodium, ipratropium-albuterol, ondansetron (ZOFRAN) IV, oxyCODONE, polyethylene glycol  Resolved Hospital Problem list    ASSESSMENT & PLAN   Acute on Chronic Hypoxic & Hypercapnic Respiratory Failure in the setting of AECOPD PMHx of Obesity Hypoventilation Syndrome, Chronic Tracheostomy -Trial Trach Collar during day, with Full vent support at night -Wean FiO2 & PEEP as tolerated to maintain O2 sats >92% -Follow intermittent Chest X-ray & ABG as needed -Implement VAP Bundle -Scheduled & Prn Bronchodilators -PO Prednisone & Budesonide nebs -Completed 5 day course of Azithromycin    Chronic HFpEF without acute exacerbation Tachycardia -Continuous cardiac monitoring -Maintain MAP >65 -BNP 32.3 -HS Troponin negative x2 (11 ~ 11) -Resume home Lasix (40 mg PO daily) as BP and renal function permits -Resume home Metoprolol 25 mg daily   Mild Leukocytosis>>resolved -Monitor fever curve -Trend WBC's & Procalcitonin -Follow cultures as above -Completed 5 day course of Azithromycin -Chest X-ray on 8/11 & 8/13 without obvious pneumonia  -Urinalysis negative for UTI   Hyperglycemia -CBG's -SSI -Follow ICU Hypo/Hyperglycemia protocol   GERD -Pepcid BID   Best practice (right click and "Reselect all SmartList Selections" daily)  Diet:  Carb modified diet Pain/Anxiety/Delirium protocol (if indicated): No VAP protocol (if indicated): Yes DVT prophylaxis: Subcutaneous Heparin GI prophylaxis: H2B Glucose control:  SSI Central venous access:  N/A Arterial line:  N/A Foley:  N/A Mobility:  OOB as tolerated  PT consulted: yes Last date  of multidisciplinary goals of care discussion [8/16] Code Status:  full code Disposition: ICU  Critical care time: 38 minutes    Darel Hong, AGACNP-BC [facility] Prefer epic messenger for cross cover needs If after hours, please call E-link

## 2021-05-16 DIAGNOSIS — J9601 Acute respiratory failure with hypoxia: Secondary | ICD-10-CM

## 2021-05-16 DIAGNOSIS — Z7189 Other specified counseling: Secondary | ICD-10-CM | POA: Diagnosis not present

## 2021-05-16 LAB — GLUCOSE, CAPILLARY
Glucose-Capillary: 108 mg/dL — ABNORMAL HIGH (ref 70–99)
Glucose-Capillary: 118 mg/dL — ABNORMAL HIGH (ref 70–99)
Glucose-Capillary: 142 mg/dL — ABNORMAL HIGH (ref 70–99)
Glucose-Capillary: 126 mg/dL — ABNORMAL HIGH (ref 70–99)
Glucose-Capillary: 147 mg/dL — ABNORMAL HIGH (ref 70–99)
Glucose-Capillary: 168 mg/dL — ABNORMAL HIGH (ref 70–99)

## 2021-05-16 NOTE — TOC Progression Note (Signed)
Transition of Care [facility]) - Progression Note    Patient Details  Name: Eddie Chapman MRN: [id number] Date of Birth: January 14, 1961  Transition of Care [facility]) CM/SW [place], [place] Phone Number: [phone] 05/16/2021, 3:56 PM  Clinical Narrative:     Patient is active with [facility], [place] confirmed with Judson Roch. CSW requested home health orders for PT/OT, RN and home health aide.  Attending confirmed receiving request. CSW spoke with patient's Bubba Hales (Significant other) [phone] [facility]) who wanted in formation on private duty caregivers. CSW stated I would provide a list for her.  CSW confirmed w/ Ms. Johny Blamer that [facility] will continue providing home health services.  Ms. Johny Blamer, verbalized understanding.   Expected Discharge Plan: [place] Barriers to Discharge: Continued Medical Work up  Expected Discharge Plan and Services Expected Discharge Plan: [place]   Discharge Planning Services: CM Consult Post Acute Care Choice: Durable Medical Equipment, Home Health Living arrangements for the past 2 months: Single Family Home                   DME Agency: [employer]                   Social Determinants of Health (SDOH) Interventions    Readmission Risk Interventions No flowsheet data found.

## 2021-05-16 NOTE — Progress Notes (Signed)
Physical Therapy Treatment Patient Details Name: Eddie Chapman MRN: [id number] DOB: [birth year] Today's Date: 05/16/2021    History of Present Illness Per MD notes, pt is a 60 y.o. male who presented to ER secondary top progressive SOB; admitted for management of acute/chronic hypoxic hypercarbic respiratory failure due to AECOPD (trach dependant).  Of note, recently admitted at [facility] from 04/25/2021 to 05/04/2021 for similar diagnosis/course; appears patient/significant other may be unable to consistently utilize home vent system. PMH includes COPD, obesity hypoventilation syndrome, and HFpEF.  MD assessment includesacute on chronic hypoxic & hypercapnic respiratory failure in the setting of AECOPD, chronic HFpEF without acute exacerbation, tachycardia, and mild leukocytosis.    PT Comments    Pt was pleasant and motivated to participate during the session. Pt reported new numbness at bilateral feet(L>R). Pt gave good effort throughout session. Pt's HR in the 90s at rest and max of 110s during OOB activity. Pt able to perform all functional mobility with supervision-min guard. Pt ambulated in room using RW with overall stability needing min verbal cueing for sequencing during backwards walking. Pt limited with walking distance secondary to generalized weakness and fatigue. Pt will benefit from further OOB and walking activities next visit as appropriate. Pt will benefit from PT services in a SNF setting upon discharge to safely address deficits listed in patient problem list for decreased caregiver assistance and eventual return to PLOF.   Follow Up Recommendations  SNF     Equipment Recommendations  None recommended by PT    Recommendations for Other Services       Precautions / Restrictions Precautions Precautions: Fall Restrictions Weight Bearing Restrictions: No    Mobility  Bed Mobility Overal bed mobility: Needs Assistance Bed Mobility: Supine to Sit;Sit to Supine     Supine to  sit: Supervision;HOB elevated Sit to supine: Supervision   General bed mobility comments: Verbal cues for sequencing.    Transfers Overall transfer level: Needs assistance Equipment used: Rolling walker (2 wheeled) Transfers: Sit to/from Stand Sit to Stand: Min guard         General transfer comment: Pt demonstrated good concentric/eccentric control from elevated surface.  Ambulation/Gait Ambulation/Gait assistance: Min guard Gait Distance (Feet): 10 Feet x1; 36f x1 Assistive device: Rolling walker (2 wheeled) Gait Pattern/deviations: Step-through pattern;Trunk flexed Gait velocity: decreased   General Gait Details: Pt walked forwards/backwards along [place] needing verbal cueing for sequencing and walker placement when walking backwards.   Stairs             Wheelchair Mobility    Modified Rankin (Stroke Patients Only)       Balance Overall balance assessment: Needs assistance Sitting-balance support: No upper extremity supported;Feet unsupported Sitting balance-Leahy Scale: Good Sitting balance - Comments: Supervision at EOB   Standing balance support: Bilateral upper extremity supported;During functional activity Standing balance-Leahy Scale: Fair Standing balance comment: Heavy reliance on UE support through RW                            Cognition Arousal/Alertness: Awake/alert Behavior During Therapy: WFL for tasks assessed/performed Overall Cognitive Status: Within Functional Limits for tasks assessed                                        Exercises Total Joint Exercises Marching in Standing: AROM;Strengthening;Both;10 reps;Standing Other Exercises Other  Exercises: Static standing 1-2 min x2 with min guard for improved activity tolerance and balance.    General Comments        Pertinent Vitals/Pain Pain Assessment: 0-10 Pain Score: 8  Pain Location: Chronic thoracic  back pain Pain Descriptors / Indicators: Aching;Sore Pain Intervention(s): Monitored during session;Repositioned    Home Living                      Prior Function            PT Goals (current goals can now be found in the care plan section) Progress towards PT goals: Progressing toward goals    Frequency    Min 2X/week      PT Plan Current plan remains appropriate    Co-evaluation              AM-PAC PT "6 Clicks" Mobility   Outcome Measure  Help needed turning from your back to your side while in a flat bed without using bedrails?: None Help needed moving from lying on your back to sitting on the side of a flat bed without using bedrails?: A Little Help needed moving to and from a bed to a chair (including a wheelchair)?: A Little Help needed standing up from a chair using your arms (e.g., wheelchair or bedside chair)?: A Little Help needed to walk in hospital room?: A Little Help needed climbing 3-5 steps with a railing? : A Lot 6 Click Score: 18    End of Session Equipment Utilized During Treatment: Oxygen;Gait belt;Other (comment) (via trach collar @ 6L) Activity Tolerance: Patient tolerated treatment well Patient left: with call bell/phone within reach;in bed;with family/visitor present (with respiratory therapist) Nurse Communication: Mobility status (need for new call bell) PT Visit Diagnosis: Muscle weakness (generalized) (M62.81);Difficulty in walking, not elsewhere classified (R26.2);Unsteadiness on feet (R26.81)     Time: [id number] PT Time Calculation (min) (ACUTE ONLY): 27 min  Charges:                        Tomer Rossen SPT 05/16/21, 4:47 PM

## 2021-05-16 NOTE — Progress Notes (Addendum)
Daily Progress Note   Patient Name: Eddie Chapman       Date: 05/16/2021 DOB: [birth year]  Age: 60 y.o. MRN#: [id number] Attending Physician: Flora Lipps, MD Primary Care Physician: Adin Hector, MD Admit Date: 05/10/2021  Reason for Consultation/Follow-up: Establishing goals of care  Subjective: Met with patient and significant other at bedside. He states they live together alone. He states they have no children.   Prior to his trach, he was able to do things around the house such as putting things away, or minor work on the house. He mowed the yard.   He discussed his diagnosis, prognosis, GOC, EOL wishes disposition and options.  Created space and opportunity for patient  to explore thoughts and feelings regarding current medical information.   A detailed discussion was had today regarding advanced directives.  Concepts specific to code status, artifical feeding and hydration, IV antibiotics and rehospitalization were discussed.  The difference between an aggressive medical intervention path and a comfort care path was discussed.  Values and goals of care important to patient and family were attempted to be elicited.  Discussed limitations of medical interventions to prolong quality of life in some situations and discussed the concept of human mortality.  He states he does not use his vent at night because he does not sleep well. His significant other pointed out he may sleep better if he would use the ventilator. He has been educated that he needs to use his ventilator at night or when he sleeps. He states he did not understand the importance of it but will start using it as he should. He also states he will try to lose weight in hopes of eventually weaning from the ventilator, and  decannulation.   Significant other states she is willing to take him home and try again if he will follow the instructions on home vent/ TC use. He states he would want all care including CPR, to have a chance as he has been close to death in the past, and lived.  Length of Stay: 6  Current Medications: Scheduled Meds:   budesonide (PULMICORT) nebulizer solution  0.5 mg Nebulization BID   chlorhexidine  15 mL Mouth Rinse BID   Chlorhexidine Gluconate Cloth  6 each Topical Q0600   enoxaparin (LOVENOX) injection  0.5 mg/kg Subcutaneous  Q24H   famotidine  20 mg Oral BID   furosemide  40 mg Oral Daily   insulin aspart  0-15 Units Subcutaneous Q4H   ipratropium-albuterol  3 mL Nebulization Q6H   mouth rinse  15 mL Mouth Rinse q12n4p   metoprolol tartrate  25 mg Oral Daily   sertraline  25 mg Oral Daily    Continuous Infusions:   PRN Meds: acetaminophen, chlorpheniramine-HYDROcodone, docusate sodium, ipratropium-albuterol, ondansetron (ZOFRAN) IV, oxyCODONE, polyethylene glycol  Physical Exam Pulmonary:     Comments: Tracheostomy.  Neurological:     Mental Status: He is alert.            Vital Signs: BP 117/71   Pulse 89   Temp 98.8 F (37.1 C) (Oral)   Resp (!) 27   Ht _0  (1.778 m)   Wt (!) 147.4 kg   SpO2 97%   BMI 46.63 kg/m  SpO2: SpO2: 97 % O2 Device: O2 Device: Tracheostomy Collar O2 Flow Rate: O2 Flow Rate (L/min): 8 L/min  Intake/output summary:  Intake/Output Summary (Last 24 hours) at 05/16/2021 1203 Last data filed at 05/16/2021 1100 Gross per 24 hour  Intake 600 ml  Output 1500 ml  Net -900 ml   LBM: Last BM Date: 05/15/21 Baseline Weight: Weight: (!) 149.9 kg Most recent weight: Weight: (!) 147.4 kg       Patient Active Problem List   Diagnosis Date Noted   Acute on chronic respiratory failure with hypoxia and hypercapnia (HCC) 05/10/2021   Acute on chronic respiratory failure with hypercapnia (HCC) 04/25/2021   Depression    Atelectasis     Leukocytosis    Fever    Acute on chronic systolic congestive heart failure (HCC)    Acute respiratory failure with hypoxia (HCC)    Palliative care by specialist    Respiratory failure with hypercapnia ([place]) 02/23/2021   Acute respiratory failure with hypoxia and hypercapnia (HCC) 10/07/2020   OSA (obstructive sleep apnea) 10/07/2020   Obesity hypoventilation syndrome ([place]) 10/93/2355   Acute diastolic CHF (congestive heart failure) ([place]) 10/07/2020   Obesity, Class III, BMI 40-49.9 (morbid obesity) ([place]) 10/07/2020   Occlusion and stenosis of vertebral artery 08/19/2016   Aneurysm, cerebral, nonruptured 08/19/2016   Essential hypertension 08/19/2016   Hyperlipidemia 08/19/2016    Palliative Care Assessment & Plan    Recommendations/Plan: Recommend outpatient palliative.  Full code/full scope.  Significant other is comfortable with talking him home if he will use his devices as he has been instructed.     Code Status:    Code Status Orders  (From admission, onward)           Start     Ordered   05/11/21 1616  Full code  Continuous        05/11/21 1615           Code Status History     Date Active Date Inactive Code Status Order ID Comments User Context   05/11/2021 1321 05/11/2021 1615 DNR [id number]  Tyler Pita, MD Inpatient   05/10/2021 1841 05/11/2021 1321 Full Code [id number]  Bradly Bienenstock, NP ED   04/25/2021 1009 05/04/2021 1841 DNR [id number]  Milus Banister, NP ED   03/02/2021 1006 04/05/2021 2100 DNR [id number]  Flora Lipps, MD Inpatient   02/23/2021 1320 03/02/2021 1006 Full Code [id number]  Ottie Glazier, MD ED   09/21/2020 1524 10/20/2020 2336 Full Code [id number]  Ottie Glazier, MD ED        Care  plan was discussed with CCM  Thank you for allowing the Palliative Medicine Team to assist in the care of this patient.   Time In: 10:30 Time Out: 11:20 Total Time 50 min Prolonged Time Billed  no       Greater than 50%  of this time was spent  counseling and coordinating care related to the above assessment and plan.  Asencion Gowda, NP  Please contact Palliative Medicine Team phone at [phone] for questions and concerns.

## 2021-05-16 NOTE — TOC Progression Note (Signed)
Transition of Care [facility]) - Progression Note    Patient Details  Name: Eddie Chapman MRN: [id number] Date of Birth: [birth year]  Transition of Care [facility]) CM/SW [place], [place] Phone Number: [phone] 05/16/2021, 12:12 PM  Clinical Narrative:     CSW spoke with patient's SO Bubba Hales (Significant other) [phone] [facility]) in regards to retraining and education for the patient's vent.  CSW asked Ms. Johny Blamer if she felt comfortable managing the patient's vent and Ms.Johny Blamer stated she did.  CSW stated [place] would be willing schedule education and training and they will contact her with to schedule an appointment.  Ms. Johny Blamer verbalized understanding and agreement.  CSW updated Dr. Mortimer Fries and Thedore Mins from [place].    [facility] DME will re-educate on vent maintenance this afternoon at 4:30PM, in the patient's room.  Expected Discharge Plan: [place] Barriers to Discharge: Continued Medical Work up  Expected Discharge Plan and Services Expected Discharge Plan: [place]   Discharge Planning Services: CM Consult Post Acute Care Choice: Durable Medical Equipment, Home Health Living arrangements for the past 2 months: Single Family Home                   DME Agency: [employer]                   Social Determinants of Health (SDOH) Interventions    Readmission Risk Interventions No flowsheet data found.

## 2021-05-16 NOTE — Progress Notes (Signed)
NAME:  Eddie Chapman, MRN:  [id number], DOB:  02-Jan-1961, LOS: 6 ADMISSION DATE:  05/10/2021, INITIAL CONSULTATION DATE:  05/10/21 REFERRING MD:  Dr. Archie Balboa, CHIEF COMPLAINT:  Shortness of breath   Brief Patient Description  60 year old male tracheostomy dependent with past medical history significant for COPD, obesity hypoventilation syndrome, and HFpEF admitted with acute on chronic hypoxic and hypercapnic respiratory failure in setting of AECOPD requiring mechanical ventilation.  Of note he was recently admitted at [facility] from 04/25/2021 to 05/04/2021 for treatment of acute on chronic hypoxic and hypercapnic respiratory failure due to AE COPD, obesity hypoventilation syndrome and underlying chronic diastolic heart failure.  ED Course: Upon arrival to the ED he was noted to be in respiratory distress with significant tachypnea. On exam he exhibited diffuse rhonchi/wheezing.  ABG was concerning for significant hypercapnia and hypoxia.  He was subsequently placed on mechanical ventilation with noted improvement in respiratory status.  He was also given 125 mg of IV Solu-Medrol, bronchodilators, and azithromycin.   Initial vital signs: Blood pressure 118/60, pulse 30, respiratory rate 35, temperature 98.4, SPO2 97% Labs: Bicarbonate 42, glucose 154, BNP 32.3, high-sensitivity troponin 11, WBC 11.0 with neutrophilia, COVID-19 PCR negative ABG: pH 7.31/PCO2 103/PO2 74/HCO3 51.9 Imaging: Chest x-ray: Stable cardiomegaly. Tracheostomy tube is unchanged in position. Old left rib fractures are noted. No pneumothorax is noted. Possible small left pleural effusion is noted. Right lung is clear.   PCCM is asked to admit the patient to ICU for further work-up and treatment of acute on chronic hypoxic and hypercapnic respiratory failure in setting of AECOPD requiring mechanical ventilation  Pertinent  Medical History  COPD Obesity hypoventilation  syndrome Pneumonia HFpEF Hypertension Hyperlipidemia GERD T8 vertebral fracture Significant Hospital Events: Including procedures, antibiotic start and stop dates in addition to other pertinent events   05/10/2021: Presented to ED with shortness of breath, tracheostomy connected to ventilator in ED, PCCM asked to admit 05/14/21: Tolerating TC during day and vent at night; Palliative Care consulted to assist with [place] 05/15/21: Tolerating TC.  Restarting home Metoprolol and home Lasix.  TOC team working on discharge disposition. 05/16/21:  Continues to tolerate TC during day and resting on vent at night.  Palliative Care to meet with pt and family to follow up on [place].  Cultures:  SARS-CoV-2 PCR>> negative Influenza PCR>> negative   Antimicrobials:  Azithromycin 8/11>>8/15  Interim History / Subjective:  -No acute events noted overnight -Afebrile, hemodynamically stable, NO vasopressors -Currently on 35% TC, rested on vent overnight -UOP 1.45L last 24 hrs (net - 4.3 L since admit) -Course of Prednisone completed yesterday -Palliative Care to meet with pt and significant other regarding goals of care -TOC team working on discharge disposition -Pt denies chest pain, SOB, wheezing, sputum production, N/V/D   OBJECTIVE  Blood pressure 118/86, pulse 77, temperature 99.1 F (37.3 C), resp. rate (!) 29, height '5\' 10"'  (1.778 m), weight (!) 147.4 kg, SpO2 97 %.    Vent Mode: PRVC FiO2 (%):  [35 %-40 %] 35 % Set Rate:  [12 bmp] 12 bmp Vt Set:  [550 mL] 550 mL PEEP:  [5 cmH20] 5 cmH20 Plateau Pressure:  [18 cmH20] 18 cmH20   Intake/Output Summary (Last 24 hours) at 05/16/2021 0728 Last data filed at 05/15/2021 2100 Gross per 24 hour  Intake 960 ml  Output 1450 ml  Net -490 ml    Filed Weights   05/13/21 0500 05/14/21 0500 05/15/21 0500  Weight: (!) 147.8 kg (!) 146.9 kg (!) 147.4  kg   Physical Examination: GENERAL: Acute on chronically ill appearing 60 year old patient, sitting in  the bed with no acute distress. On Trach collar this morning at 35% FI02 EYES: Pupils equal, round, reactive to light and accommodation. No scleral icterus. Extraocular muscles intact.  HEENT: Head atraumatic, normocephalic. Oropharynx and nasopharynx clear.  NECK:  Supple, no jugular venous distention. No thyroid enlargement, no tenderness. Tracheostomy midline clean dry and intact LUNGS: Clear breath sounds bilaterally, no wheezing, even, No use of accessory muscles of respiration.  CARDIOVASCULAR:  Tachycardia, regular rhythm, s1s2, No murmurs, rubs, or gallops.  ABDOMEN: Obese, Soft, nontender, nondistended. Bowel sounds present. No organomegaly or mass.  EXTREMITIES: Trace edema bilateral LE. No cyanosis, or clubbing.  NEUROLOGIC: Cranial nerves II through XII are intact. Muscle strength 5/5 in all extremities. Sensation intact. Gait not checked.  PSYCHIATRIC: The patient is alert and oriented x 3.  SKIN: Warm and dry.  No obvious rash, lesion, or ulcer.   Labs/imaging that I havepersonally reviewed  (right click and "Reselect all SmartList Selections" daily)    Labs   CBC: Recent Labs  Lab 05/10/21 1515 05/11/21 0436 05/12/21 0528 05/13/21 0426 05/14/21 0511 05/15/21 0512  WBC 11.0* 9.1 16.1* 12.3* 9.5 9.6  NEUTROABS 8.7*  --   --   --  6.7  --   HGB 13.6 12.6* 12.6* 12.2* 12.6* 12.8*  HCT 42.9 38.4* 39.0 37.3* 39.1 40.2  MCV 102.6* 98.7 98.7 100.3* 100.0 98.5  PLT 179 176 178 171 165 157     Basic Metabolic Panel: Recent Labs  Lab 05/11/21 0436 05/12/21 0528 05/13/21 0426 05/14/21 0511 05/15/21 0512  NA 139 139 140 138 138  K 4.6 3.9 4.1 4.3 4.4  CL 93* 93* 95* 95* 95*  CO2 34* 37* 32 38* 32  GLUCOSE 184* 117* 87 97 100*  BUN 30* 39* 28* 22 22  CREATININE 1.37* 1.17 0.91 0.81 0.87  CALCIUM 9.0 8.9 8.6* 8.8* 8.8*  MG 2.0 2.0 2.1 2.1 2.2  PHOS 1.3* 3.8 2.7 3.6 3.9    GFR: Estimated Creatinine Clearance: 129.6 mL/min (by C-G formula based on SCr of 0.87  mg/dL). Recent Labs  Lab 05/10/21 1515 05/11/21 0436 05/12/21 0528 05/13/21 0426 05/14/21 0511 05/15/21 0512  PROCALCITON <0.10  --   --  <0.10  --   --   WBC 11.0*   < > 16.1* 12.3* 9.5 9.6   < > = values in this interval not displayed.     Liver Function Tests: No results for input(s): AST, ALT, ALKPHOS, BILITOT, PROT, ALBUMIN in the last 168 hours. No results for input(s): LIPASE, AMYLASE in the last 168 hours. No results for input(s): AMMONIA in the last 168 hours.  ABG    Component Value Date/Time   PHART 7.48 (H) 05/14/2021 0831   PCO2ART 53 (H) 05/14/2021 0831   PO2ART 71 (L) 05/14/2021 0831   HCO3 39.5 (H) 05/14/2021 0831   O2SAT 95.2 05/14/2021 0831      Coagulation Profile: No results for input(s): INR, PROTIME in the last 168 hours.  Cardiac Enzymes: No results for input(s): CKTOTAL, CKMB, CKMBINDEX, TROPONINI in the last 168 hours.  HbA1C: Hgb A1c MFr Bld  Date/Time Value Ref Range Status  04/29/2021 05:02 AM 5.6 4.8 - 5.6 % Final    Comment:    (NOTE) Pre diabetes:          5.7%-6.4%  Diabetes:              >  6.4%  Glycemic control for   <7.0% adults with diabetes   03/02/2021 04:52 AM 5.9 (H) 4.8 - 5.6 % Final    Comment:    (NOTE)         Prediabetes: 5.7 - 6.4         Diabetes: >6.4         Glycemic control for adults with diabetes: <7.0     CBG: Recent Labs  Lab 05/15/21 1118 05/15/21 1550 05/15/21 1914 05/15/21 2324 05/16/21 0431  GLUCAP 132* 179* 170* 170* 108*     Allergies Allergies  Allergen Reactions   Amlodipine Swelling    Ankle swelling    Divalproex Sodium Other (See Comments)    Elevated liver enzymes      Home Medications  Prior to Admission medications   Medication Sig Start Date End Date Taking? Authorizing Provider  ARIPiprazole (ABILIFY) 5 MG tablet Take 1 tablet (5 mg total) by mouth daily. 04/06/21  Yes Darel Hong D, NP  ascorbic acid (VITAMIN C) 250 MG tablet Take 1 tablet (250 mg total) by  mouth 2 (two) times daily. 04/05/21  Yes Bradly Bienenstock, NP  docusate (COLACE) 50 MG/5ML liquid Place 10 mLs (100 mg total) into feeding tube 2 (two) times daily. 04/05/21  Yes Darel Hong D, NP  famotidine (PEPCID) 20 MG tablet Take 1 tablet (20 mg total) by mouth 2 (two) times daily. 05/04/21 06/03/21 Yes Max Sane, MD  furosemide (LASIX) 40 MG tablet Take 1 tablet (40 mg total) by mouth 2 (two) times daily. 05/04/21 06/03/21 Yes Max Sane, MD  methylPREDNISolone (MEDROL DOSEPAK) 4 MG TBPK tablet Take 4-24 mg by mouth daily at 12 noon. 05/06/21  Yes [provider]  metoprolol tartrate (LOPRESSOR) 25 MG tablet Take 25 mg by mouth daily. 05/05/21  Yes [provider]  oxyCODONE 10 MG TABS Place 1 tablet (10 mg total) into feeding tube every 6 (six) hours as needed for moderate pain. 04/05/21  Yes Darel Hong D, NP  pantoprazole (PROTONIX) 40 MG tablet Take 40 mg by mouth daily. 05/05/21  Yes [provider]  senna-docusate (SENOKOT-S) 8.6-50 MG tablet Take 1 tablet by mouth 2 (two) times daily. 04/05/21  Yes Bradly Bienenstock, NP  bisacodyl (DULCOLAX) 10 MG suppository Place 1 suppository (10 mg total) rectally daily as needed for moderate constipation. 04/05/21   Bradly Bienenstock, NP  Chlorhexidine Gluconate Cloth 2 % PADS Apply 6 each topically daily. 04/06/21   Bradly Bienenstock, NP  chlorhexidine gluconate, MEDLINE KIT, (PERIDEX) 0.12 % solution 15 mLs by Mouth Rinse route 2 (two) times daily. 04/05/21   Bradly Bienenstock, NP  feeding supplement (ENSURE ENLIVE / ENSURE PLUS) LIQD Take 237 mLs by mouth 3 (three) times daily between meals. 04/05/21   Darel Hong D, NP  ipratropium-albuterol (DUONEB) 0.5-2.5 (3) MG/3ML SOLN Take 3 mLs by nebulization 3 (three) times daily. 04/05/21   Bradly Bienenstock, NP  polyethylene glycol (MIRALAX / GLYCOLAX) 17 g packet Take 17 g by mouth daily. 04/06/21   Bradly Bienenstock, NP  polyvinyl alcohol (LIQUIFILM TEARS) 1.4 % ophthalmic solution Place 1  drop into both eyes as needed for dry eyes. 04/05/21   Bradly Bienenstock, NP    Scheduled Meds:  budesonide (PULMICORT) nebulizer solution  0.5 mg Nebulization BID   chlorhexidine  15 mL Mouth Rinse BID   Chlorhexidine Gluconate Cloth  6 each Topical Q0600   enoxaparin (LOVENOX) injection  0.5 mg/kg Subcutaneous Q24H  famotidine  20 mg Oral BID   furosemide  40 mg Oral Daily   insulin aspart  0-15 Units Subcutaneous Q4H   ipratropium-albuterol  3 mL Nebulization Q6H   mouth rinse  15 mL Mouth Rinse q12n4p   metoprolol tartrate  25 mg Oral Daily   sertraline  25 mg Oral Daily   Continuous Infusions:   PRN Meds:.acetaminophen, chlorpheniramine-HYDROcodone, docusate sodium, ipratropium-albuterol, ondansetron (ZOFRAN) IV, oxyCODONE, polyethylene glycol  Resolved Hospital Problem list    ASSESSMENT & PLAN   Acute on Chronic Hypoxic & Hypercapnic Respiratory Failure in the setting of AECOPD PMHx of Obesity Hypoventilation Syndrome, Chronic Tracheostomy -Trach Collar during day, with Full vent support at night -Wean FiO2 & PEEP as tolerated to maintain O2 sats >88% -Follow intermittent Chest X-ray & ABG as needed -Implement VAP Bundle -Scheduled & Prn Bronchodilators -Budesonide nebs -Completed course of IV Solumedrol/PO Prednisone -Completed 5 day course of Azithromycin    Chronic HFpEF without acute exacerbation Tachycardia -Continuous cardiac monitoring -Maintain MAP >65 -BNP 32.3 -HS Troponin negative x2 (11 ~ 11) -Continue home Lasix (40 mg PO daily) as BP and renal function permits -Continue home Metoprolol 25 mg daily   Mild Leukocytosis>>resolved -Monitor fever curve -Trend WBC's & Procalcitonin -Follow cultures as above -Completed 5 day course of Azithromycin -Chest X-ray on 8/11 & 8/13 without obvious pneumonia  -Urinalysis negative for UTI   Hyperglycemia -CBG's -SSI -Follow ICU Hypo/Hyperglycemia protocol   GERD -Pepcid BID   Best practice (right click  and "Reselect all SmartList Selections" daily)  Diet:  Carb modified diet Pain/Anxiety/Delirium protocol (if indicated): No VAP protocol (if indicated): Yes DVT prophylaxis: Lovenox GI prophylaxis: H2B Glucose control:  SSI Central venous access:  N/A Arterial line:  N/A Foley:  N/A Mobility:  OOB as tolerated  PT consulted: yes Last date of multidisciplinary goals of care discussion [8/17] Code Status:  full code Disposition: ICU  Critical care time: 36 minutes    Darel Hong, AGACNP-BC [facility] Prefer epic messenger for cross cover needs If after hours, please call E-link

## 2021-05-17 DIAGNOSIS — J9601 Acute respiratory failure with hypoxia: Secondary | ICD-10-CM | POA: Diagnosis not present

## 2021-05-17 LAB — BASIC METABOLIC PANEL
Anion gap: 7 (ref 5–15)
BUN: 22 mg/dL (ref 8–23)
CO2: 36 mmol/L — ABNORMAL HIGH (ref 22–32)
Calcium: 8.5 mg/dL — ABNORMAL LOW (ref 8.9–10.3)
Chloride: 93 mmol/L — ABNORMAL LOW (ref 98–111)
Creatinine, Ser: 0.8 mg/dL (ref 0.61–1.24)
GFR, Estimated: >60 mL/min (ref >60)
Glucose, Bld: 102 mg/dL — ABNORMAL HIGH (ref 70–99)
Potassium: 4.3 mmol/L (ref 3.5–5.1)
Sodium: 136 mmol/L (ref 135–145)

## 2021-05-17 LAB — GLUCOSE, CAPILLARY
Glucose-Capillary: 110 mg/dL — ABNORMAL HIGH (ref 70–99)
Glucose-Capillary: 146 mg/dL — ABNORMAL HIGH (ref 70–99)
Glucose-Capillary: 109 mg/dL — ABNORMAL HIGH (ref 70–99)

## 2021-05-17 LAB — CBC
HCT: 39.2 % (ref 39.0–52.0)
Hemoglobin: 12.7 g/dL — ABNORMAL LOW (ref 13.0–17.0)
MCH: 31.5 pg (ref 26.0–34.0)
MCHC: 32.4 g/dL (ref 30.0–36.0)
MCV: 97.3 fL (ref 80.0–100.0)
Platelets: 187 10*3/uL (ref 150–400)
RBC: 4.03 MIL/uL — ABNORMAL LOW (ref 4.22–5.81)
RDW: 13.6 % (ref 11.5–15.5)
WBC: 12.9 10*3/uL — ABNORMAL HIGH (ref 4.0–10.5)
nRBC: 0 % (ref 0.0–0.2)

## 2021-05-17 MED ORDER — FUROSEMIDE 40 MG PO TABS: 40.0000 mg | ORAL_TABLET | Freq: Every day | ORAL | 2 refills | Status: AC

## 2021-05-17 MED ORDER — IPRATROPIUM-ALBUTEROL 0.5-2.5 (3) MG/3ML IN SOLN: 3.0000 mL | RESPIRATORY_TRACT | 1 refills | Status: AC | PRN

## 2021-05-17 MED ORDER — SERTRALINE HCL 25 MG PO TABS: 25.0000 mg | ORAL_TABLET | Freq: Every day | ORAL | 2 refills | Status: AC

## 2021-05-17 MED ORDER — METOPROLOL TARTRATE 25 MG PO TABS: 25.0000 mg | ORAL_TABLET | Freq: Every day | ORAL | 2 refills | Status: AC

## 2021-05-17 NOTE — Progress Notes (Signed)
70 Transferred home per EMS.

## 2021-05-17 NOTE — Discharge Summary (Signed)
Physician Discharge Summary  [name] MRN: [id number] DOB/AGE: [birth year] 60 y.o.  Admit date: 05/10/2021 Discharge date: 05/17/2021  Admission Diagnoses: END STAGE COPD VENT DEPENDANT RESP FAILURE COR P[place]MONALE  Discharge Diagnoses:  Active Problems:   Acute on chronic respiratory failure with hypoxia and hypercapnia (HCC)  END STAGE COPD VENT DEPENDANT RESP FAILURE COR P[place]MONALE Discharged Condition: stable  Hospital Course:  60 year old male tracheostomy dependent with past medical history significant for COPD, obesity hypoventilation syndrome, and HFpEF admitted with acute on chronic hypoxic and hypercapnic respiratory failure in setting of AECOPD requiring mechanical ventilation.   Of note he was recently admitted at [facility] from 04/25/2021 to 05/04/2021 for treatment of acute on chronic hypoxic and hypercapnic respiratory failure due to AE COPD, obesity hypoventilation syndrome and underlying chronic diastolic heart failure.   ED Course: Upon arrival to the ED he was noted to be in respiratory distress with significant tachypnea. On exam he exhibited diffuse rhonchi/wheezing.  ABG was concerning for significant hypercapnia and hypoxia.  He was subsequently placed on mechanical ventilation with noted improvement in respiratory status.  He was also given 125 mg of IV Solu-Medrol, bronchodilators, and azithromycin.    PCCM is asked to admit the patient to ICU for further work-up and treatment of acute on chronic hypoxic and hypercapnic respiratory failure in setting of AECOPD requiring mechanical ventilation   Pertinent  Medical History  COPD Obesity hypoventilation syndrome Pneumonia HFpEF Hypertension Hyperlipidemia GERD T8 vertebral fracture Significant Hospital Events: Including procedures, antibiotic start and stop dates in addition to other pertinent events   05/10/2021: Presented to ED with shortness of breath, tracheostomy connected to ventilator in ED,  PCCM asked to admit 05/14/21: Tolerating TC during day and vent at night; Palliative Care consulted to assist with [place] 05/15/21: Tolerating TC.  Restarting home Metoprolol and home Lasix.  TOC team working on discharge disposition. 05/16/21:  Continues to tolerate TC during day and resting on vent at night.  Palliative Care to meet with pt and family to follow up on [place].  Acute on Chronic Hypoxic & Hypercapnic Respiratory Failure in the setting of AECOPD PMHx of Obesity Hypoventilation Syndrome, Chronic Tracheostomy -Trach Collar during day, with Full vent support at night -Wean FiO2 & PEEP as tolerated to maintain O2 sats >88% -Follow intermittent Chest X-ray & ABG as needed -Implement VAP Bundle -Scheduled & Prn Bronchodilators -Budesonide nebs -Completed course of IV Solumedrol/PO Prednisone -Completed 5 day course of Azithromycin    Chronic HFpEF without acute exacerbation Tachycardia -Continuous cardiac monitoring -Maintain MAP >65 -BNP 32.3 -HS Troponin negative x2 (11 ~ 11) -Continue home Lasix (40 mg PO daily) as BP and renal function permits -Continue home Metoprolol 25 mg daily   Mild Leukocytosis>>resolved -Monitor fever curve -Trend WBC's & Procalcitonin -Follow cultures as above -Completed 5 day course of Azithromycin -Chest X-ray on 8/11 & 8/13 without obvious pneumonia  -Urinalysis negative for UTI   Hyperglycemia -CBG's -SSI -Follow ICU Hypo/Hyperglycemia protocol   GERD -Pepcid BID   DISCHARGE HOME WITH VENT SETTINGS RR 12 TV 550 Fio2 40% PEEP 5    Discharge Exam: Blood pressure 110/71, pulse (!) 114, temperature 98.5 F (36.9 C), resp. rate (!) 26, height '5\' 10"'  (1.778 m), weight (!) 146.7 kg, SpO2 97 %.  PHYSICAL EXAMINATION:  GENERAL:NAD EYES: Pupils equal, round, reactive to light.  No scleral icterus.  NECK: Supple. S/p TRACH  PULMONARY: +rhonchi, +wheezing CARDIOVASCULAR: S1 and S2.  No murmurs  GASTROINTESTINAL: Soft, nontender,  -distended.  Positive bowel sounds.  MUSCULOSKELETAL: +edema.  NEUROLOGIC: obtunded SKIN:intact,warm,dry     Allergies as of 05/17/2021       Reactions   Amlodipine Swelling   Ankle swelling    Divalproex Sodium Other (See Comments)   Elevated liver enzymes        Medication List     STOP taking these medications    ARIPiprazole 5 MG tablet Commonly known as: ABILIFY   ascorbic acid 250 MG tablet Commonly known as: VITAMIN C   bisacodyl 10 MG suppository Commonly known as: DULCOLAX   chlorhexidine gluconate (MEDLINE KIT) 0.12 % solution Commonly known as: PERIDEX   Chlorhexidine Gluconate Cloth 2 % Pads   docusate 50 MG/5ML liquid Commonly known as: COLACE   feeding supplement Liqd   methylPREDNISolone 4 MG Tbpk tablet Commonly known as: MEDROL DOSEPAK   Oxycodone HCl 10 MG Tabs   pantoprazole 40 MG tablet Commonly known as: PROTONIX   polyethylene glycol 17 g packet Commonly known as: MIRALAX / GLYCOLAX   polyvinyl alcohol 1.4 % ophthalmic solution Commonly known as: LIQUIFILM TEARS   senna-docusate 8.6-50 MG tablet Commonly known as: Senokot-S       TAKE these medications    famotidine 20 MG tablet Commonly known as: PEPCID Take 1 tablet (20 mg total) by mouth 2 (two) times daily.   furosemide 40 MG tablet Commonly known as: LASIX Take 1 tablet (40 mg total) by mouth daily. Start taking on: May 18, 2021 What changed: when to take this   ipratropium-albuterol 0.5-2.5 (3) MG/3ML Soln Commonly known as: DUONEB Take 3 mLs by nebulization every 4 (four) hours as needed. What changed:  when to take this reasons to take this   metoprolol tartrate 25 MG tablet Commonly known as: LOPRESSOR Take 1 tablet (25 mg total) by mouth daily. Start taking on: May 18, 2021   sertraline 25 MG tablet Commonly known as: ZOLOFT Take 1 tablet (25 mg total) by mouth daily. Start taking on: May 18, 2021         Signed: Flora Lipps 05/17/2021, 10:36 AM

## 2021-05-17 NOTE — Progress Notes (Signed)
Patient taken off ventilator and placed on 35% trach collar with Maay-Muir Valve in place and trach cuff deflated.

## 2021-05-17 NOTE — TOC Transition Note (Signed)
Transition of Care [facility]) - CM/SW Discharge Note   Patient Details  Name: Eddie Chapman MRN: [id number] Date of Birth: 08/08/1961  [facility] CM/SW Contact:  Ova Freshwater Phone Number: [phone] 05/17/2021, 11:31 AM   Clinical Narrative:     Patient will discharge home with home health.  Patient is active with [facility] and will continue w/ PT, OT, HHA and RN services.  Patient will return home w/ Trilogy vent from [employer]. CSW scheduled [facility] and they will pick up patient after 3:00PM.  CSW updated patient's Baum,[name] (Significant other) [phone] [facility]).  CSW updated EDP and Unit RN.  CSW placed transportation paperwork in patient's binder.     Barriers to Discharge: Continued Medical Work up   Patient Goals and CMS Choice Patient states their goals for this hospitalization and ongoing recovery are:: Return home with home health care CMS Medicare.gov Compare Post Acute Care list provided to:: Other (Comment Required) (Significant other present)    Discharge Placement                       Discharge Plan and Services   Discharge Planning Services: CM Consult Post Acute Care Choice: Durable Medical Equipment, Home Health            DME Agency: [employer]                  Social Determinants of Health (SDOH) Interventions     Readmission Risk Interventions No flowsheet data found.

## 2021-05-17 NOTE — Progress Notes (Signed)
Occupational Therapy Treatment Patient Details Name: Eddie Chapman MRN: [id number] DOB: 1960-10-19 Today's Date: 05/17/2021    History of present illness Per MD notes, pt is a 60 y.o. male who presented to ER secondary top progressive SOB; admitted for management of acute/chronic hypoxic hypercarbic respiratory failure due to AECOPD (trach dependant).  Of note, recently admitted at [facility] from 04/25/2021 to 05/04/2021 for similar diagnosis/course; appears patient/significant other may be unable to consistently utilize home vent system. PMH includes COPD, obesity hypoventilation syndrome, and HFpEF.  MD assessment includesacute on chronic hypoxic & hypercapnic respiratory failure in the setting of AECOPD, chronic HFpEF without acute exacerbation, tachycardia, and mild leukocytosis.   OT comments  Mr Biderman was seen for OT treatment on this date. Upon arrival to room pt reclined in bed agreeable to bath prior to d/c today. Pt requires SETUP + SUPERVISION + MIN cues for UB bathing/dressing seated EOB. MIN A x2 + RW for LB access - assist for thoroguhness with periarea and dynamic standing balance. Pt continues to require persistent cues to complete ADLs. Pt making good progress toward goals. Pt continues to benefit from skilled OT services to maximize return to PLOF and minimize risk of future falls, injury, caregiver burden, and readmission. Will continue to follow POC. Discharge recommendation remains appropriate.    Follow Up Recommendations  SNF    Equipment Recommendations  Wheelchair (measurements OT)    Recommendations for Other Services      Precautions / Restrictions Precautions Precautions: Fall Precaution Comments: trach collar, PMV Restrictions Weight Bearing Restrictions: No       Mobility Bed Mobility Overal bed mobility: Needs Assistance Bed Mobility: Supine to Sit;Sit to Supine     Supine to sit: Supervision;HOB elevated Sit to supine: Supervision         Transfers Overall transfer level: Needs assistance Equipment used: Rolling walker (2 wheeled) Transfers: Sit to/from Stand Sit to Stand: Min guard              Balance Overall balance assessment: Needs assistance Sitting-balance support: No upper extremity supported;Feet unsupported Sitting balance-Leahy Scale: Good Sitting balance - Comments: Supervision at EOB   Standing balance support: During functional activity;Single extremity supported Standing balance-Leahy Scale: Fair                             ADL either performed or assessed with clinical judgement   ADL Overall ADL's : Needs assistance/impaired                                       General ADL Comments: SETUP + SUPERVISION + MIN cues for UB bathing/dressing seated EOB. MIN A x2 + RW for LB access - assist for thoroguhness with periarea and dynamic standing balance      Cognition Arousal/Alertness: Awake/alert Behavior During Therapy: WFL for tasks assessed/performed Overall Cognitive Status: History of cognitive impairments - at baseline                                          Exercises Exercises: Other exercises Other Exercises Other Exercises: Pt educated re: falls prevention, ECS Other Exercises: LBD, UBD, grooming, sup<>sit, sit<>stand, sitting/standing balance/tolerance      General Comments SpO2 mid 80s - 95% on trach collar  Pertinent Vitals/ Pain       Pain Assessment: No/denies pain   Frequency  Min 2X/week        Progress Toward Goals  OT Goals(current goals can now be found in the care plan section)  Progress towards OT goals: Progressing toward goals  Acute Rehab OT Goals Patient Stated Goal: to return home OT Goal Formulation: With patient Time For Goal Achievement: 05/25/21 Potential to Achieve Goals: Good ADL Goals Pt Will Perform Grooming: with modified independence;standing Pt Will Perform Lower Body Dressing: with  modified independence;sit to/from stand Pt Will Transfer to Toilet: with modified independence;regular height toilet Pt Will Perform Toileting - Clothing Manipulation and hygiene: with supervision;sit to/from stand;with adaptive equipment  Plan Discharge plan remains appropriate;Frequency remains appropriate       AM-PAC OT "6 Clicks" Daily Activity     Outcome Measure   Help from another person eating meals?: None Help from another person taking care of personal grooming?: A Little Help from another person toileting, which includes using toliet, bedpan, or urinal?: A Lot Help from another person bathing (including washing, rinsing, drying)?: A Little Help from another person to put on and taking off regular upper body clothing?: A Little Help from another person to put on and taking off regular lower body clothing?: A Little 6 Click Score: 18    End of Session Equipment Utilized During Treatment: Oxygen;Rolling walker  OT Visit Diagnosis: Other abnormalities of gait and mobility (R26.89);Muscle weakness (generalized) (M62.81);Other symptoms and signs involving cognitive function   Activity Tolerance Patient tolerated treatment well   Patient Left in bed;with call bell/phone within reach   Nurse Communication          Time: [id number] OT Time Calculation (min): 15 min  Charges: OT General Charges $OT Visit: 1 Visit OT Treatments $Self Care/Home Management : 8-22 mins  Dessie Coma, M.S. OTR/L  05/17/21, 1:21 PM  ascom [phone]

## 2021-05-21 DIAGNOSIS — J9621 Acute and chronic respiratory failure with hypoxia: Secondary | ICD-10-CM | POA: Diagnosis not present

## 2021-05-21 DIAGNOSIS — E662 Morbid (severe) obesity with alveolar hypoventilation: Secondary | ICD-10-CM | POA: Diagnosis not present

## 2021-05-21 DIAGNOSIS — G4733 Obstructive sleep apnea (adult) (pediatric): Secondary | ICD-10-CM | POA: Diagnosis not present

## 2021-05-21 DIAGNOSIS — Z930 Tracheostomy status: Secondary | ICD-10-CM | POA: Diagnosis not present

## 2021-05-28 DIAGNOSIS — R531 Weakness: Secondary | ICD-10-CM | POA: Diagnosis not present

## 2021-05-28 DIAGNOSIS — J9611 Chronic respiratory failure with hypoxia: Secondary | ICD-10-CM | POA: Diagnosis not present

## 2021-05-28 DIAGNOSIS — Z743 Need for continuous supervision: Secondary | ICD-10-CM | POA: Diagnosis not present

## 2021-05-28 DIAGNOSIS — Z09 Encounter for follow-up examination after completed treatment for conditions other than malignant neoplasm: Secondary | ICD-10-CM | POA: Diagnosis not present

## 2021-05-28 DIAGNOSIS — R0902 Hypoxemia: Secondary | ICD-10-CM | POA: Diagnosis not present

## 2021-05-28 DIAGNOSIS — Z930 Tracheostomy status: Secondary | ICD-10-CM | POA: Diagnosis not present

## 2021-05-28 DIAGNOSIS — I5022 Chronic systolic (congestive) heart failure: Secondary | ICD-10-CM | POA: Diagnosis not present

## 2021-05-29 DIAGNOSIS — Z930 Tracheostomy status: Secondary | ICD-10-CM | POA: Diagnosis not present

## 2021-05-31 DIAGNOSIS — I5022 Chronic systolic (congestive) heart failure: Secondary | ICD-10-CM | POA: Diagnosis not present

## 2021-05-31 DIAGNOSIS — I272 Pulmonary hypertension, unspecified: Secondary | ICD-10-CM | POA: Diagnosis not present

## 2021-06-01 DIAGNOSIS — M6281 Muscle weakness (generalized): Secondary | ICD-10-CM | POA: Diagnosis not present

## 2021-06-01 DIAGNOSIS — Z9981 Dependence on supplemental oxygen: Secondary | ICD-10-CM | POA: Diagnosis not present

## 2021-06-01 DIAGNOSIS — Z6841 Body Mass Index (BMI) 40.0 and over, adult: Secondary | ICD-10-CM | POA: Diagnosis not present

## 2021-06-01 DIAGNOSIS — Z930 Tracheostomy status: Secondary | ICD-10-CM | POA: Diagnosis not present

## 2021-06-01 DIAGNOSIS — E662 Morbid (severe) obesity with alveolar hypoventilation: Secondary | ICD-10-CM | POA: Diagnosis not present

## 2021-06-01 DIAGNOSIS — I5022 Chronic systolic (congestive) heart failure: Secondary | ICD-10-CM | POA: Diagnosis not present

## 2021-06-01 DIAGNOSIS — J9622 Acute and chronic respiratory failure with hypercapnia: Secondary | ICD-10-CM | POA: Diagnosis not present

## 2021-06-01 DIAGNOSIS — J9611 Chronic respiratory failure with hypoxia: Secondary | ICD-10-CM | POA: Diagnosis not present

## 2021-06-01 DIAGNOSIS — J441 Chronic obstructive pulmonary disease with (acute) exacerbation: Secondary | ICD-10-CM | POA: Diagnosis not present

## 2021-06-01 DIAGNOSIS — Z430 Encounter for attention to tracheostomy: Secondary | ICD-10-CM | POA: Diagnosis not present

## 2021-06-04 DIAGNOSIS — G4733 Obstructive sleep apnea (adult) (pediatric): Secondary | ICD-10-CM | POA: Diagnosis not present

## 2021-06-11 ENCOUNTER — Ambulatory Visit: Payer: PPO

## 2021-06-15 DIAGNOSIS — J9612 Chronic respiratory failure with hypercapnia: Secondary | ICD-10-CM | POA: Diagnosis not present

## 2021-06-15 DIAGNOSIS — R5381 Other malaise: Secondary | ICD-10-CM | POA: Diagnosis not present

## 2021-06-15 DIAGNOSIS — I959 Hypotension, unspecified: Secondary | ICD-10-CM | POA: Diagnosis not present

## 2021-06-15 DIAGNOSIS — Z7401 Bed confinement status: Secondary | ICD-10-CM | POA: Diagnosis not present

## 2021-06-15 DIAGNOSIS — R0902 Hypoxemia: Secondary | ICD-10-CM | POA: Diagnosis not present

## 2021-06-15 DIAGNOSIS — R531 Weakness: Secondary | ICD-10-CM | POA: Diagnosis not present

## 2021-06-19 ENCOUNTER — Inpatient Hospital Stay
Admission: EM | Admit: 2021-06-19 | Discharge: 2021-06-28 | DRG: 871 | Disposition: A | Discharge status: Home Health | Payer: PPO | Attending: Internal Medicine | Admitting: Internal Medicine

## 2021-06-19 ENCOUNTER — Encounter: Payer: Self-pay | Admitting: Pulmonary Disease

## 2021-06-19 ENCOUNTER — Emergency Department: Payer: PPO

## 2021-06-19 ENCOUNTER — Other Ambulatory Visit: Payer: Self-pay

## 2021-06-19 DIAGNOSIS — J440 Chronic obstructive pulmonary disease with acute lower respiratory infection: Secondary | ICD-10-CM | POA: Diagnosis present

## 2021-06-19 DIAGNOSIS — Z888 Allergy status to other drugs, medicaments and biological substances status: Secondary | ICD-10-CM

## 2021-06-19 DIAGNOSIS — J441 Chronic obstructive pulmonary disease with (acute) exacerbation: Secondary | ICD-10-CM | POA: Diagnosis not present

## 2021-06-19 DIAGNOSIS — J15212 Pneumonia due to Methicillin resistant Staphylococcus aureus: Secondary | ICD-10-CM | POA: Diagnosis present

## 2021-06-19 DIAGNOSIS — I517 Cardiomegaly: Secondary | ICD-10-CM | POA: Diagnosis not present

## 2021-06-19 DIAGNOSIS — J9622 Acute and chronic respiratory failure with hypercapnia: Secondary | ICD-10-CM | POA: Diagnosis not present

## 2021-06-19 DIAGNOSIS — Z930 Tracheostomy status: Secondary | ICD-10-CM | POA: Diagnosis not present

## 2021-06-19 DIAGNOSIS — R0902 Hypoxemia: Secondary | ICD-10-CM | POA: Diagnosis not present

## 2021-06-19 DIAGNOSIS — I272 Pulmonary hypertension, unspecified: Secondary | ICD-10-CM | POA: Diagnosis present

## 2021-06-19 DIAGNOSIS — Z9981 Dependence on supplemental oxygen: Secondary | ICD-10-CM | POA: Diagnosis not present

## 2021-06-19 DIAGNOSIS — Y95 Nosocomial condition: Secondary | ICD-10-CM | POA: Diagnosis present

## 2021-06-19 DIAGNOSIS — R531 Weakness: Secondary | ICD-10-CM | POA: Diagnosis not present

## 2021-06-19 DIAGNOSIS — Z7982 Long term (current) use of aspirin: Secondary | ICD-10-CM

## 2021-06-19 DIAGNOSIS — F32A Depression, unspecified: Secondary | ICD-10-CM | POA: Diagnosis not present

## 2021-06-19 DIAGNOSIS — I110 Hypertensive heart disease with heart failure: Secondary | ICD-10-CM | POA: Diagnosis present

## 2021-06-19 DIAGNOSIS — Z23 Encounter for immunization: Secondary | ICD-10-CM

## 2021-06-19 DIAGNOSIS — Z9911 Dependence on respirator [ventilator] status: Secondary | ICD-10-CM | POA: Diagnosis not present

## 2021-06-19 DIAGNOSIS — Z7401 Bed confinement status: Secondary | ICD-10-CM | POA: Diagnosis not present

## 2021-06-19 DIAGNOSIS — E785 Hyperlipidemia, unspecified: Secondary | ICD-10-CM | POA: Diagnosis not present

## 2021-06-19 DIAGNOSIS — S2242XA Multiple fractures of ribs, left side, initial encounter for closed fracture: Secondary | ICD-10-CM | POA: Diagnosis not present

## 2021-06-19 DIAGNOSIS — R410 Disorientation, unspecified: Secondary | ICD-10-CM | POA: Diagnosis not present

## 2021-06-19 DIAGNOSIS — R000 Tachycardia, unspecified: Secondary | ICD-10-CM | POA: Diagnosis not present

## 2021-06-19 DIAGNOSIS — A419 Sepsis, unspecified organism: Secondary | ICD-10-CM | POA: Diagnosis not present

## 2021-06-19 DIAGNOSIS — J9601 Acute respiratory failure with hypoxia: Secondary | ICD-10-CM | POA: Diagnosis not present

## 2021-06-19 DIAGNOSIS — I5022 Chronic systolic (congestive) heart failure: Secondary | ICD-10-CM | POA: Diagnosis not present

## 2021-06-19 DIAGNOSIS — Z7189 Other specified counseling: Secondary | ICD-10-CM | POA: Diagnosis not present

## 2021-06-19 DIAGNOSIS — Z6841 Body Mass Index (BMI) 40.0 and over, adult: Secondary | ICD-10-CM | POA: Diagnosis not present

## 2021-06-19 DIAGNOSIS — R0689 Other abnormalities of breathing: Secondary | ICD-10-CM | POA: Diagnosis not present

## 2021-06-19 DIAGNOSIS — L89312 Pressure ulcer of right buttock, stage 2: Secondary | ICD-10-CM | POA: Diagnosis not present

## 2021-06-19 DIAGNOSIS — E662 Morbid (severe) obesity with alveolar hypoventilation: Secondary | ICD-10-CM | POA: Diagnosis present

## 2021-06-19 DIAGNOSIS — R21 Rash and other nonspecific skin eruption: Secondary | ICD-10-CM | POA: Diagnosis not present

## 2021-06-19 DIAGNOSIS — Z20822 Contact with and (suspected) exposure to covid-19: Secondary | ICD-10-CM | POA: Diagnosis present

## 2021-06-19 DIAGNOSIS — B965 Pseudomonas (aeruginosa) (mallei) (pseudomallei) as the cause of diseases classified elsewhere: Secondary | ICD-10-CM | POA: Diagnosis not present

## 2021-06-19 DIAGNOSIS — R739 Hyperglycemia, unspecified: Secondary | ICD-10-CM | POA: Diagnosis not present

## 2021-06-19 DIAGNOSIS — Z79899 Other long term (current) drug therapy: Secondary | ICD-10-CM

## 2021-06-19 DIAGNOSIS — J9621 Acute and chronic respiratory failure with hypoxia: Secondary | ICD-10-CM

## 2021-06-19 DIAGNOSIS — L8990 Pressure ulcer of unspecified site, unspecified stage: Secondary | ICD-10-CM | POA: Insufficient documentation

## 2021-06-19 DIAGNOSIS — R627 Adult failure to thrive: Secondary | ICD-10-CM | POA: Diagnosis present

## 2021-06-19 DIAGNOSIS — G9341 Metabolic encephalopathy: Secondary | ICD-10-CM | POA: Diagnosis present

## 2021-06-19 DIAGNOSIS — J811 Chronic pulmonary edema: Secondary | ICD-10-CM | POA: Diagnosis not present

## 2021-06-19 DIAGNOSIS — M6281 Muscle weakness (generalized): Secondary | ICD-10-CM | POA: Diagnosis not present

## 2021-06-19 DIAGNOSIS — J9600 Acute respiratory failure, unspecified whether with hypoxia or hypercapnia: Secondary | ICD-10-CM

## 2021-06-19 DIAGNOSIS — J189 Pneumonia, unspecified organism: Secondary | ICD-10-CM | POA: Diagnosis not present

## 2021-06-19 DIAGNOSIS — J9690 Respiratory failure, unspecified, unspecified whether with hypoxia or hypercapnia: Secondary | ICD-10-CM | POA: Diagnosis not present

## 2021-06-19 DIAGNOSIS — R404 Transient alteration of awareness: Secondary | ICD-10-CM | POA: Diagnosis not present

## 2021-06-19 DIAGNOSIS — J9602 Acute respiratory failure with hypercapnia: Secondary | ICD-10-CM | POA: Diagnosis not present

## 2021-06-19 DIAGNOSIS — T17990A Other foreign object in respiratory tract, part unspecified in causing asphyxiation, initial encounter: Secondary | ICD-10-CM | POA: Diagnosis present

## 2021-06-19 DIAGNOSIS — R918 Other nonspecific abnormal finding of lung field: Secondary | ICD-10-CM | POA: Diagnosis not present

## 2021-06-19 DIAGNOSIS — K219 Gastro-esophageal reflux disease without esophagitis: Secondary | ICD-10-CM | POA: Diagnosis present

## 2021-06-19 DIAGNOSIS — G4733 Obstructive sleep apnea (adult) (pediatric): Secondary | ICD-10-CM | POA: Diagnosis not present

## 2021-06-19 DIAGNOSIS — Z9119 Patient's noncompliance with other medical treatment and regimen: Secondary | ICD-10-CM

## 2021-06-19 DIAGNOSIS — E119 Type 2 diabetes mellitus without complications: Secondary | ICD-10-CM

## 2021-06-19 DIAGNOSIS — E1165 Type 2 diabetes mellitus with hyperglycemia: Secondary | ICD-10-CM | POA: Diagnosis present

## 2021-06-19 DIAGNOSIS — J9611 Chronic respiratory failure with hypoxia: Secondary | ICD-10-CM | POA: Diagnosis not present

## 2021-06-19 LAB — URINE DRUG SCREEN, QUALITATIVE (ARMC ONLY)
Amphetamines, Ur Screen: NOT DETECTED
Barbiturates, Ur Screen: NOT DETECTED
Benzodiazepine, Ur Scrn: NOT DETECTED
Cannabinoid 50 Ng, Ur ~~LOC~~: POSITIVE — AB
Cocaine Metabolite,Ur ~~LOC~~: NOT DETECTED
MDMA (Ecstasy)Ur Screen: NOT DETECTED
Methadone Scn, Ur: NOT DETECTED
Opiate, Ur Screen: NOT DETECTED
Phencyclidine (PCP) Ur S: NOT DETECTED
Tricyclic, Ur Screen: NOT DETECTED

## 2021-06-19 LAB — COMPREHENSIVE METABOLIC PANEL
ALT: 20 U/L (ref 0–44)
AST: 21 U/L (ref 15–41)
Albumin: 4 g/dL (ref 3.5–5.0)
Alkaline Phosphatase: 65 U/L (ref 38–126)
Anion gap: 8 (ref 5–15)
BUN: 24 mg/dL — ABNORMAL HIGH (ref 8–23)
CO2: 43 mmol/L — ABNORMAL HIGH (ref 22–32)
Calcium: 9.4 mg/dL (ref 8.9–10.3)
Chloride: 89 mmol/L — ABNORMAL LOW (ref 98–111)
Creatinine, Ser: 0.96 mg/dL (ref 0.61–1.24)
GFR, Estimated: >60 mL/min (ref >60)
Glucose, Bld: 196 mg/dL — ABNORMAL HIGH (ref 70–99)
Potassium: 4.7 mmol/L (ref 3.5–5.1)
Sodium: 140 mmol/L (ref 135–145)
Total Bilirubin: 0.8 mg/dL (ref 0.3–1.2)
Total Protein: 7.9 g/dL (ref 6.5–8.1)

## 2021-06-19 LAB — BLOOD GAS, ARTERIAL
Acid-base deficit: 4.1 mmol/L — ABNORMAL HIGH (ref 0.0–2.0)
Bicarbonate: 27.3 mmol/L (ref 20.0–28.0)
FIO2: 1
MECHVT: 500 mL
O2 Saturation: 99.8 %
PEEP: 5 cmH2O
Patient temperature: 37
RATE: 16 resp/min
pCO2 arterial: 82 mmHg (ref 32.0–48.0)
pH, Arterial: 7.13 — CL (ref 7.350–7.450)
pO2, Arterial: 279 mmHg — ABNORMAL HIGH (ref 83.0–108.0)

## 2021-06-19 LAB — CBC WITH DIFFERENTIAL/PLATELET
Abs Immature Granulocytes: 0.1 10*3/uL — ABNORMAL HIGH (ref 0.00–0.07)
Basophils Absolute: 0 10*3/uL (ref 0.0–0.1)
Basophils Relative: 0 %
Eosinophils Absolute: 0.1 10*3/uL (ref 0.0–0.5)
Eosinophils Relative: 1 %
HCT: 44 % (ref 39.0–52.0)
Hemoglobin: 13.8 g/dL (ref 13.0–17.0)
Immature Granulocytes: 1 %
Lymphocytes Relative: 10 %
Lymphs Abs: 1.1 10*3/uL (ref 0.7–4.0)
MCH: 30.9 pg (ref 26.0–34.0)
MCHC: 31.4 g/dL (ref 30.0–36.0)
MCV: 98.4 fL (ref 80.0–100.0)
Monocytes Absolute: 1.1 10*3/uL — ABNORMAL HIGH (ref 0.1–1.0)
Monocytes Relative: 10 %
Neutro Abs: 8.4 10*3/uL — ABNORMAL HIGH (ref 1.7–7.7)
Neutrophils Relative %: 78 %
Platelets: 173 10*3/uL (ref 150–400)
RBC: 4.47 MIL/uL (ref 4.22–5.81)
RDW: 13.2 % (ref 11.5–15.5)
WBC: 10.8 10*3/uL — ABNORMAL HIGH (ref 4.0–10.5)
nRBC: 0 % (ref 0.0–0.2)

## 2021-06-19 LAB — RESP PANEL BY RT-PCR (FLU A&B, COVID) ARPGX2
Influenza A by PCR: NEGATIVE
Influenza B by PCR: NEGATIVE
SARS Coronavirus 2 by RT PCR: NEGATIVE

## 2021-06-19 LAB — PROTIME-INR
INR: 1 (ref 0.8–1.2)
Prothrombin Time: 12.7 seconds (ref 11.4–15.2)

## 2021-06-19 LAB — URINALYSIS, COMPLETE (UACMP) WITH MICROSCOPIC
Bilirubin Urine: NEGATIVE
Glucose, UA: NEGATIVE mg/dL
Hgb urine dipstick: NEGATIVE
Ketones, ur: NEGATIVE mg/dL
Leukocytes,Ua: NEGATIVE
Nitrite: NEGATIVE
Protein, ur: 30 mg/dL — AB
Specific Gravity, Urine: >1.03 — ABNORMAL HIGH (ref 1.005–1.030)
Squamous Epithelial / HPF: NONE SEEN (ref 0–5)
pH: 6 (ref 5.0–8.0)

## 2021-06-19 LAB — GLUCOSE, CAPILLARY: Glucose-Capillary: 341 mg/dL — ABNORMAL HIGH (ref 70–99)

## 2021-06-19 LAB — APTT: aPTT: 30 seconds (ref 24–36)

## 2021-06-19 LAB — MRSA NEXT GEN BY PCR, NASAL: MRSA by PCR Next Gen: DETECTED — AB

## 2021-06-19 LAB — LACTIC ACID, PLASMA
Lactic Acid, Venous: 1 mmol/L (ref 0.5–1.9)
Lactic Acid, Venous: 1.6 mmol/L (ref 0.5–1.9)

## 2021-06-19 LAB — PROCALCITONIN: Procalcitonin: <0.1 ng/mL

## 2021-06-19 LAB — STREP PNEUMONIAE URINARY ANTIGEN: Strep Pneumo Urinary Antigen: NEGATIVE

## 2021-06-19 MED ORDER — FAMOTIDINE 20 MG IN NS 100 ML IVPB
20.0000 mg | Freq: Two times a day (BID) | INTRAVENOUS | Status: DC
  Administered 2021-06-19 – 2021-06-26 (×14): 20 mg via INTRAVENOUS
  Filled 2021-06-19 (×15): qty 100

## 2021-06-19 MED ORDER — METHYLPREDNISOLONE SODIUM SUCC 125 MG IJ SOLR
125.0000 mg | Freq: Once | INTRAMUSCULAR | Status: AC
  Administered 2021-06-19: 125 mg via INTRAVENOUS
  Filled 2021-06-19: qty 2

## 2021-06-19 MED ORDER — CHLORHEXIDINE GLUCONATE 0.12% ORAL RINSE (MEDLINE KIT)
15.0000 mL | Freq: Two times a day (BID) | OROMUCOSAL | Status: DC
  Administered 2021-06-19 – 2021-06-27 (×9): 15 mL via OROMUCOSAL

## 2021-06-19 MED ORDER — ONDANSETRON HCL 4 MG/2ML IJ SOLN: 4.0000 mg | Freq: Four times a day (QID) | INTRAMUSCULAR | Status: DC | PRN

## 2021-06-19 MED ORDER — VANCOMYCIN HCL IN DEXTROSE 1-5 GM/200ML-% IV SOLN
1000.0000 mg | Freq: Once | INTRAVENOUS | Status: AC
  Administered 2021-06-19: 1000 mg via INTRAVENOUS
  Filled 2021-06-19: qty 200

## 2021-06-19 MED ORDER — IPRATROPIUM-ALBUTEROL 0.5-2.5 (3) MG/3ML IN SOLN
3.0000 mL | Freq: Once | RESPIRATORY_TRACT | Status: AC
  Administered 2021-06-19: 3 mL via RESPIRATORY_TRACT
  Filled 2021-06-19: qty 3

## 2021-06-19 MED ORDER — SODIUM CHLORIDE 0.9 % IV SOLN
2.0000 g | Freq: Once | INTRAVENOUS | Status: AC
  Administered 2021-06-19: 2 g via INTRAVENOUS
  Filled 2021-06-19: qty 2

## 2021-06-19 MED ORDER — VANCOMYCIN HCL 2000 MG/400ML IV SOLN
2000.0000 mg | Freq: Two times a day (BID) | INTRAVENOUS | Status: DC
  Administered 2021-06-19 – 2021-06-22 (×6): 2000 mg via INTRAVENOUS
  Filled 2021-06-19 (×6): qty 400

## 2021-06-19 MED ORDER — ORAL CARE MOUTH RINSE
15.0000 mL | OROMUCOSAL | Status: DC
  Administered 2021-06-19 – 2021-06-28 (×45): 15 mL via OROMUCOSAL

## 2021-06-19 MED ORDER — SODIUM CHLORIDE 0.9 % IV SOLN
2.0000 g | Freq: Three times a day (TID) | INTRAVENOUS | Status: AC
  Administered 2021-06-19 – 2021-06-26 (×19): 2 g via INTRAVENOUS
  Filled 2021-06-19 (×21): qty 2

## 2021-06-19 MED ORDER — DOCUSATE SODIUM 100 MG PO CAPS: 100.0000 mg | ORAL_CAPSULE | Freq: Two times a day (BID) | ORAL | Status: DC | PRN

## 2021-06-19 MED ORDER — ACETAMINOPHEN 325 MG PO TABS
650.0000 mg | ORAL_TABLET | ORAL | Status: DC | PRN
  Administered 2021-06-21: 650 mg via ORAL
  Filled 2021-06-19: qty 2

## 2021-06-19 MED ORDER — ENOXAPARIN SODIUM 80 MG/0.8ML IJ SOSY
70.0000 mg | PREFILLED_SYRINGE | Freq: Every day | INTRAMUSCULAR | Status: DC
  Administered 2021-06-19 – 2021-06-22 (×4): 70 mg via SUBCUTANEOUS
  Filled 2021-06-19 (×4): qty 0.8
  Filled 2021-06-19: qty 0.7

## 2021-06-19 MED ORDER — CHLORHEXIDINE GLUCONATE CLOTH 2 % EX PADS
6.0000 | MEDICATED_PAD | Freq: Every day | CUTANEOUS | Status: DC
  Administered 2021-06-19 – 2021-06-28 (×10): 6 via TOPICAL

## 2021-06-19 MED ORDER — POLYETHYLENE GLYCOL 3350 17 G PO PACK: 17.0000 g | PACK | Freq: Every day | ORAL | Status: DC | PRN

## 2021-06-19 MED ORDER — LACTATED RINGERS IV BOLUS (SEPSIS)
1000.0000 mL | Freq: Once | INTRAVENOUS | Status: AC
  Administered 2021-06-19: 1000 mL via INTRAVENOUS

## 2021-06-19 NOTE — Sepsis Progress Note (Signed)
Second LA increased but still in normal range, contacted attending MD. No new order for third LA at this time

## 2021-06-19 NOTE — ED Notes (Signed)
IV team at bedside.  Pt is back to pink and warm. Pt is responding to voice again.

## 2021-06-19 NOTE — Consult Note (Signed)
Pharmacy Antibiotic Note  Eddie Chapman is a 60 y.o. male admitted on 06/19/2021 with pneumonia.  Pharmacy has been consulted for Cefepime and Vancomycin dosing.  Plan: Cefepime 2gm every 8 hours Vancomycin 2000mg  given in ED, then 2gm every 12 hours  Vancomycin 2000 mg IV Q 12 hrs.  Goal AUC 400-550. Expected AUC: 383 (will avoid initial dose > 4gm/24hours) SCr used: 0.96  Weight: (!) 146 kg (321 lb 14 oz)  Temp (24hrs), Avg:98.1 F (36.7 C), Min:98.1 F (36.7 C), Max:98.1 F (36.7 C)  Recent Labs  Lab 06/19/21 0946  WBC 10.8*  CREATININE 0.96  LATICACIDVEN 1.0    Estimated Creatinine Clearance: 116.8 mL/min (by C-G formula based on SCr of 0.96 mg/dL).    Allergies  Allergen Reactions   Amlodipine Swelling    Ankle swelling    Divalproex Sodium Other (See Comments)    Elevated liver enzymes    Antimicrobials this admission: 9/20 Cefepime >>  9/20 Vancomycin >>   Microbiology results: cultures pending  Thank you for allowing pharmacy to be a part of this patient's care.  Aylen Rambert Rodriguez-Guzman PharmD, BCPS 06/19/2021 12:12 PM

## 2021-06-19 NOTE — ED Triage Notes (Signed)
Per EMS, Wife called out and said pt was acting altered and SOB. EMS said that pt was lethargic through transit. Oxygen was around the 70's and EMS put on 6L [place] and 6L on his trach.Pt was started on antibiotics for pneumonia this week.

## 2021-06-19 NOTE — ED Notes (Signed)
Per pharmacy, both doses are Vanc are due for loading dose.

## 2021-06-19 NOTE — ED Notes (Signed)
RN aware of bed assignment 

## 2021-06-19 NOTE — Consult Note (Signed)
PHARMACY -  BRIEF ANTIBIOTIC NOTE   Pharmacy has received consult(s) for Vancomycin and Cefepime from an ED provider.  The patient's profile has been reviewed for ht/wt/allergies/indication/available labs.    One time order(s) placed for  Vancomycin 2000mg  x1 Cefepime 2g x 1  Further antibiotics/pharmacy consults should be ordered by admitting physician if indicated.                       Clemons Salvucci Rodriguez-Guzman PharmD, BCPS 06/19/2021 10:34 AM

## 2021-06-19 NOTE — ED Provider Notes (Signed)
[facility] Emergency Department Provider Note  ____________________________________________   Event Date/Time   First MD Initiated Contact with Patient 06/19/21 [phone]     (approximate)  I have reviewed the triage vital signs and the nursing notes.   HISTORY  Chief Complaint Altered Mental Status and Shortness of Breath    HPI Eddie Chapman is a 60 y.o. male with extensive past medical history including chronic hypoxic and hypercapnic respiratory failure with trach dependence, recent admission for COPD, CHF, and respiratory failure, here with shortness of breath and confusion.  Patient is somewhat confused on arrival, limiting history.  He reportedly has been increasingly confused and drowsy over the last 24 hours.  He forgot to use his trilogy last night.  He has been drowsier and confused with increased work of breathing throughout the morning today so EMS was called.  On my assessment, the patient easily falls asleep during interview.  He shakes his head no to any pain.  Remainder of history limited due to respiratory distress and trach.  Level 5 caveat invoked as remainder of history, ROS, and physical exam limited due to patient's mental status.     Past Medical History:  Diagnosis Date   Adenomatous colon polyp    Collapsed lung 08/2020   left   Depression    Ear drum perforation    left x2    GERD (gastroesophageal reflux disease)    Hyperlipidemia    Hypertension    Pneumonia    T8 vertebral fracture [facility])     Patient Active Problem List   Diagnosis Date Noted   Acute on chronic respiratory failure with hypoxia and hypercapnia ([place]) 05/10/2021   Acute on chronic respiratory failure with hypercapnia ([place]) 04/25/2021   Depression    Atelectasis    Leukocytosis    Fever    Acute on chronic systolic congestive heart failure (HCC)    Acute respiratory failure with hypoxia ([place])    Palliative care by specialist    Respiratory failure with  hypercapnia ([place]) 02/23/2021   Acute respiratory failure with hypoxia and hypercapnia ([place]) 10/07/2020   OSA (obstructive sleep apnea) 10/07/2020   Obesity hypoventilation syndrome ([place]) 42/59/5638   Acute diastolic CHF (congestive heart failure) ([place]) 10/07/2020   Obesity, Class III, BMI 40-49.9 (morbid obesity) ([place]) 10/07/2020   Occlusion and stenosis of vertebral artery 08/19/2016   Aneurysm, cerebral, nonruptured 08/19/2016   Essential hypertension 08/19/2016   Hyperlipidemia 08/19/2016    Past Surgical History:  Procedure Laterality Date   CARPAL TUNNEL RELEASE     left hand   COLONOSCOPY  09/18/2009, 09/15/2014   COLONOSCOPY WITH PROPOFOL N/A 12/29/2017   Procedure: COLONOSCOPY WITH PROPOFOL;  Surgeon: Manya Silvas, MD;  Location: [facility] ENDOSCOPY;  Service: Endoscopy;  Laterality: N/A;   EYE SURGERY     FRACTURE SURGERY     HERNIA REPAIR     LIPOMA EXCISION     SPINE SURGERY     TRACHEOSTOMY TUBE PLACEMENT N/A 03/07/2021   Procedure: TRACHEOSTOMY;  Surgeon: Margaretha Sheffield, MD;  Location: [facility] ORS;  Service: ENT;  Laterality: N/A;    Prior to Admission medications   Medication Sig Start Date End Date Taking? Authorizing Provider  azithromycin (ZITHROMAX) 250 MG tablet Take 250 mg by mouth 3 (three) times a week. 06/16/21   [provider]  famotidine (PEPCID) 20 MG tablet Take 1 tablet (20 mg total) by mouth 2 (two) times daily. 05/04/21 06/03/21  Max Sane, MD  furosemide (  LASIX) 40 MG tablet Take 1 tablet (40 mg total) by mouth daily. 05/18/21   Flora Lipps, MD  glycopyrrolate (ROBINUL) 1 MG tablet Take 0.5 mg by mouth 3 (three) times daily. 06/15/21   [provider]  ipratropium-albuterol (DUONEB) 0.5-2.5 (3) MG/3ML SOLN Take 3 mLs by nebulization every 4 (four) hours as needed. 05/17/21   Flora Lipps, MD  metoprolol tartrate (LOPRESSOR) 25 MG tablet Take 1 tablet (25 mg total) by mouth daily. 05/18/21   Flora Lipps, MD  sertraline (ZOLOFT) 25 MG tablet Take  1 tablet (25 mg total) by mouth daily. 05/18/21   Flora Lipps, MD    Allergies Amlodipine and Divalproex sodium  Family History  Problem Relation Age of Onset   Cancer Mother    Varicose Veins Mother     Social History Social History   Tobacco Use   Smoking status: Never   Smokeless tobacco: Never  Vaping Use   Vaping Use: Never used  Substance Use Topics   Alcohol use: Never   Drug use: Never    Review of Systems  Review of Systems  Unable to perform ROS: Mental status change    ____________________________________________  PHYSICAL EXAM:      VITAL SIGNS: ED Triage Vitals  Enc Vitals Group     BP 06/19/21 0940 90/80     Pulse Rate 06/19/21 0940 (!) 113     Resp 06/19/21 0940 (!) 27     Temp 06/19/21 0940 98.1 F (36.7 C)     Temp Source 06/19/21 0940 Oral     SpO2 06/19/21 0939 92 %     Weight 06/19/21 1004 (!) 321 lb 14 oz (146 kg)     Height --      Head Circumference --      Peak Flow --      Pain Score 06/19/21 0948 0     Pain Loc --      Pain Edu? --      Excl. in [place]? --      Physical Exam Vitals and nursing note reviewed.  Constitutional:      General: He is not in acute distress.    Appearance: He is well-developed.  HENT:     Head: Normocephalic and atraumatic.  Eyes:     Conjunctiva/sclera: Conjunctivae normal.  Neck:     Comments: Trach in place.  Inner cannula removed with thick secretions and near occlusion. Cardiovascular:     Rate and Rhythm: Regular rhythm. Tachycardia present.     Heart sounds: Normal heart sounds.  Pulmonary:     Effort: Tachypnea present. No respiratory distress.     Breath sounds: Examination of the right-middle field reveals rhonchi. Examination of the left-middle field reveals rhonchi. Examination of the right-lower field reveals rhonchi. Examination of the left-lower field reveals rhonchi. Decreased breath sounds, wheezing and rhonchi present.  Abdominal:     General: There is no distension.   Musculoskeletal:     Cervical back: Neck supple.  Skin:    General: Skin is warm.     Capillary Refill: Capillary refill takes less than 2 seconds.     Findings: No rash.  Neurological:     Mental Status: He is alert and oriented to person, place, and time.     Motor: No abnormal muscle tone.      ____________________________________________   LABS (all labs ordered are listed, but only abnormal results are displayed)  Labs Reviewed  COMPREHENSIVE METABOLIC PANEL - Abnormal; Notable for  the following components:      Result Value   Chloride 89 (*)    CO2 43 (*)    Glucose, Bld 196 (*)    BUN 24 (*)    All other components within normal limits  CBC WITH DIFFERENTIAL/PLATELET - Abnormal; Notable for the following components:   WBC 10.8 (*)    Neutro Abs 8.4 (*)    Monocytes Absolute 1.1 (*)    Abs Immature Granulocytes 0.10 (*)    All other components within normal limits  URINALYSIS, COMPLETE (UACMP) WITH MICROSCOPIC - Abnormal; Notable for the following components:   APPearance CLEAR (*)    Specific Gravity, Urine >1.030 (*)    Protein, ur 30 (*)    Bacteria, UA RARE (*)    All other components within normal limits  BLOOD GAS, VENOUS - Abnormal; Notable for the following components:   pCO2, Ven 99 (*)    Bicarbonate 47.6 (*)    Acid-Base Excess 15.8 (*)    All other components within normal limits  RESP PANEL BY RT-PCR (FLU A&B, COVID) ARPGX2  CULTURE, BLOOD (ROUTINE X 2)  CULTURE, BLOOD (ROUTINE X 2)  URINE CULTURE  CULTURE, RESPIRATORY W GRAM STAIN  LACTIC ACID, PLASMA  PROTIME-INR  APTT  LACTIC ACID, PLASMA    ____________________________________________  EKG: Sinus tachycardia, ventricular rate 112.  PR 145, QRS 70, QTc 445.  No acute ST elevations or depressions. ________________________________________  RADIOLOGY All imaging, including plain films, CT scans, and ultrasounds, independently reviewed by me, and interpretations confirmed via formal  radiology reads.  ED MD interpretation:   Sinus tachycardia, ventricular rate 112.  PR 145, QRS 78, QTc 445.  No acute ST elevations or depressions.  Official radiology report(s): DG Chest Port 1 View  Result Date: 06/19/2021 CLINICAL DATA:  Questionable sepsis - evaluate for abnormality EXAM: PORTABLE CHEST 1 VIEW COMPARISON:  May 15, 2021. FINDINGS: Similar enlargement of the cardiac silhouette. Similar mild patchy right basilar airspace opacities. Possible small left pleural effusion. No consolidation. No visible pneumothorax. Tracheostomy tube projects midline at the level of the clavicular heads. Remote left rib fractures. IMPRESSION: 1. Similar mild patchy right basilar airspace opacities, possibly aspiration and/or pneumonia. 2. Cardiomegaly with possible small left pleural effusion. Dedicated PA and lateral radiographs could further characterize if clinically indicated. Electronically Signed   By: Margaretha Sheffield M.D.   On: 06/19/2021 10:29    ____________________________________________  PROCEDURES   Procedure(s) performed (including Critical Care):  .Critical Care Performed by: Duffy Bruce, MD Authorized by: Duffy Bruce, MD   Critical care provider statement:    Critical care time (minutes):  35   Critical care time was exclusive of:  Separately billable procedures and treating other patients and teaching time   Critical care was necessary to treat or prevent imminent or life-threatening deterioration of the following conditions:  Cardiac failure, circulatory failure and respiratory failure   Critical care was time spent personally by me on the following activities:  Development of treatment plan with patient or surrogate, discussions with consultants, evaluation of patient's response to treatment, examination of patient, obtaining history from patient or surrogate, ordering and performing treatments and interventions, ordering and review of laboratory studies,  ordering and review of radiographic studies, pulse oximetry, re-evaluation of patient's condition and review of old charts   I assumed direction of critical care for this patient from another provider in my specialty: no    ____________________________________________  INITIAL IMPRESSION / MDM / ASSESSMENT AND PLAN /  ED COURSE  As part of my medical decision making, I reviewed the following data within the [place] notes reviewed and incorporated, Old chart reviewed, Notes from prior ED visits, and [place] Controlled Substance Database       *DAMARCUS REGGIO was evaluated in Emergency Department on 06/19/2021 for the symptoms described in the history of present illness. He was evaluated in the context of the global COVID-19 pandemic, which necessitated consideration that the patient might be at risk for infection with the SARS-CoV-2 virus that causes COVID-19. Institutional protocols and algorithms that pertain to the evaluation of patients at risk for COVID-19 are in a state of rapid change based on information released by regulatory bodies including the CDC and federal and state organizations. These policies and algorithms were followed during the patient's care in the ED.  Some ED evaluations and interventions may be delayed as a result of limited staffing during the pandemic.*     Medical Decision Making: 60 year old male here with cough, shortness of breath, altered mental status.  Lab work shows acute on chronic hypercapnic respiratory failure.  Chest x-ray shows persistent basilar atelectasis versus pneumonia and he does have thick secretions, likely occluding his tracheostomy as well.  This was suctioned, and patient will be started on broad-spectrum, healthcare associated antibiotics as well as given steroids and nebs for his acute on chronic hypercapnia.  He was placed on his trilogy vent due to this hypercapnia.  Otherwise, no evidence of severe sepsis.  Blood pressure  normal.  ____________________________________________  FINAL CLINICAL IMPRESSION(S) / ED DIAGNOSES  Final diagnoses:  HCAP (healthcare-associated pneumonia)  Acute on chronic respiratory failure with hypercapnia (HCC)     MEDICATIONS GIVEN DURING THIS VISIT:  Medications  vancomycin (VANCOCIN) IVPB 1000 mg/200 mL premix (has no administration in time range)  lactated ringers bolus 1,000 mL (0 mLs Intravenous Stopped 06/19/21 1120)  vancomycin (VANCOCIN) IVPB 1000 mg/200 mL premix (1,000 mg Intravenous New Bag/Given 06/19/21 1038)  ceFEPIme (MAXIPIME) 2 g in sodium chloride 0.9 % 100 mL IVPB (0 g Intravenous Stopped 06/19/21 1030)  ipratropium-albuterol (DUONEB) 0.5-2.5 (3) MG/3ML nebulizer solution 3 mL (3 mLs Nebulization Given 06/19/21 1014)  methylPREDNISolone sodium succinate (SOLU-MEDROL) 125 mg/2 mL injection 125 mg (125 mg Intravenous Given 06/19/21 1122)     ED Discharge Orders     None        Note:  This document was prepared using Dragon voice recognition software and may include unintentional dictation errors.   Duffy Bruce, MD 06/19/21 1140

## 2021-06-19 NOTE — Consult Note (Signed)
PHARMACY CONSULT NOTE - FOLLOW UP  Pharmacy Consult for Electrolyte Monitoring and Replacement   Recent Labs: Potassium (mmol/L)  Date Value  06/19/2021 4.7  05/04/2014 4.3   Magnesium (mg/dL)  Date Value  05/15/2021 2.2   Calcium (mg/dL)  Date Value  06/19/2021 9.4   Calcium, Total (mg/dL)  Date Value  05/04/2014 9.1   Albumin (g/dL)  Date Value  06/19/2021 4.0   Phosphorus (mg/dL)  Date Value  05/15/2021 3.9   Sodium (mmol/L)  Date Value  06/19/2021 140  05/04/2014 140     Assessment: Patient admitted with altered mental status, shortness of breath and possible sepsis. Pharmacy was consulted for electrolytes management. Na, K, Mg, Ca, and phos all within normal limits.   Goal of Therapy:  Electrolytes WNL  Plan:   No additional intervention needed at this time. Will continue to follow and replace as needed.  Derrious Bologna Rodriguez-Guzman PharmD, BCPS 06/19/2021 12:51 PM

## 2021-06-19 NOTE — ED Notes (Signed)
This Writer went to check on Pt.  Respiratory in room.  Pt has disconnected self from vent and lost IV access.  Pt's face was blue and body was slumped in bed.  Respiratory reconnect vent.  This Probation officer and another Therapist, sports repositioned Pt.

## 2021-06-19 NOTE — H&P (Signed)
NAME:  ADARIAN BUR, MRN:  [id number], DOB:  [birth year], LOS: 0 ADMISSION DATE:  06/19/2021, CONSULTATION DATE:  06/19/2021 REFERRING MD:  [name], CHIEF COMPLAINT:  Altered Mental Status, shortness of breath   Brief Pt Description / Synopsis:  60 year old male admitted with acute metabolic encephalopathy and acute on chronic hypoxic & hypercapnic respiratory failure in setting of suspected healthcare associated pneumonia along with noncompliance with home trilogy, requiring mechanical ventilation via chronic tracheostomy.  History of Present Illness:  Time manus is a 60 year old male with a past medical history significant for acute on chronic hypoxic and hypercapnic respiratory failure with trach dependence, HFrEF, obstructive sleep apnea, obesity hypoventilation syndrome, pulmonary hypertension, and previous noncompliance with home Trilogy who presented to [facility] ED on 06/19/2021 due to complaints of shortness of breath and confusion.  Patient is currently altered/lethargic and no family is currently available, therefore history is obtained from ED nursing notes.  Per notes it is reported he has become increasingly confused and lethargic over the past 24 hours.  This morning he exhibited increased work of breathing along with the confusion of which EMS was dispatched.  EMS found him hypoxic with O2 saturations in the 70's.  It is reported that he forgot to use him home Trilogy last night.    Of note he has had multiple recent hospitalizations, with his most recent admission at [facility] from 05/10/21 throughout 05/17/21 for acute on chronic hypoxic & Hypercapnic Respiratory Failure.   He followed up with Pulmonologist Dr. [name] on 06/15/21, where he was started on Azithromycin for Tracheitis vs Pneumonia.  ED COURSE: Given his work of breathing and copious thick secretions from his Lurline Idol, he was placed on his Trilogy vent.  Initial Vital Signs: Temperature 98.1 F, respiratory rate 27, pulse 113,  blood pressure 98/80, SPO2 92% Significant Labs: Chloride 89, CO2 43, glucose 196, BUN 24, creatinine 0.96, lactic acid 1.0, WBC 10.8 with neutrophilia Venous blood gas: pH 7.29/PCO2 99/PO2 pending/HCO3 47.6 Imaging: Chest x-ray>>1. Similar mild patchy right basilar airspace opacities, possibly aspiration and/or pneumonia. 2. Cardiomegaly with possible small left pleural effusion. Dedicated PA and lateral radiographs could further characterize if clinically indicated.  He met SIRS Criteria, and given concern for possible pneumonia, sepsis workup was initiated and he was given broad spectrum antibiotics including IV Cefepime and Vancomycin.  He also received 125 mg IV Solumedrol and bronchodilators.  PCCM is asked to admit the patient to ICU for further workup and treatment of acute metabolic encephalopathy and acute on chronic hypoxic & hypercapnic respiratory failure in setting of suspected healthcare associated pneumonia along with noncompliance with home trilogy, requiring mechanical ventilation via chronic tracheostomy.  Pertinent  Medical History  Acute on chronic hypoxic hypercapnic respiratory failure Tracheostomy dependence Obstructive sleep apnea Obesity hypoventilation syndrome Pulmonary Hypertension Hypertension  Hyperlipidemia HFrEF Pneumonia Depression GERD T8 Vertebral Fracture  Micro Data:  06/19/2021: SARS-CoV-2 and influenza PCR>> negative 06/19/2021: Blood culture>> 06/19/2021: Urine>> 06/19/2021: Sputum>> 06/19/2021: Strep pneumo urinary antigen>> 06/19/2021: Legionella urinary antigen>>  Antimicrobials:  Cefepime 9/20>> Vancomycin 9/20>>  Significant Hospital Events: Including procedures, antibiotic start and stop dates in addition to other pertinent events   06/19/2021: Presented to ED due to shortness of breath and altered mental status.  Found to be hypercapnic requiring mechanical ventilation via chronic tracheostomy.  PCCM asked to admit  Interim History /  Subjective:  -Presented to ED due to altered mental status and increased work of breathing -Found to be hypercapnic, Was placed on trilogy via chronic  tracheostomy -Hemodynamically stable, blood pressure soft -Will arouse to gentle stimulation and follows some simple commands, however easily falls asleep during interview  Objective   Blood pressure 109/80, pulse (!) 112, temperature 98.1 F (36.7 C), temperature source Oral, resp. rate (!) 21, weight (!) 146 kg, SpO2 99 %.    Vent Mode: AC FiO2 (%):  [35 %] 35 % Set Rate:  [16 bmp] 16 bmp Vt Set:  [500 mL] 500 mL PEEP:  [5 cmH20] 5 cmH20   Intake/Output Summary (Last 24 hours) at 06/19/2021 1228 Last data filed at 06/19/2021 1120 Gross per 24 hour  Intake 1000 ml  Output --  Net 1000 ml   Filed Weights   06/19/21 1004  Weight: (!) 146 kg    Examination: General: Acute on chronically ill-appearing male, tracheostomy to ventilator, no acute distress HENT: Atraumatic, normocephalic, neck supple, no JVD, tracheostomy midline clean dry and intact Lungs: Coarse rhonchi throughout, no wheezing, vent assisted, even Cardiovascular: Tachycardia, regular rhythm, S1-S2, no murmurs, rubs, gallops Abdomen: Obese, soft, nontender, no guarding or rebound tenderness, bowel sounds positive x4 Extremities: Normal bulk and tone, no deformities Neuro: Lethargic, will arouse to voice and follow commands but falls asleep easily during interview, unable to assess orientation, pupils PERRLA GU: Deferred  Resolved Hospital Problem list     Assessment & Plan:   Acute on Chronic Hypoxic & Hypercapnic Respiratory Failure due to potential Healthcare Associated Pneumonia vs Aspiration, & noncompliance with home Trilogy PMHx of Tracheostomy dependence, Obstructive Sleep Apnea, Obesity Hypoventilation syndrome, Pulmonary Hypertension -Full vent support, implement lung protective strategies -Plateau pressures less than 30 cm H20 -Wean FiO2 & PEEP as  tolerated to maintain O2 sats >92% -Follow intermittent Chest X-ray & ABG as needed -Spontaneous breathing trials and trach collar trials as tolerated -Implement VAP Bundle -Prn Bronchodilators -Place on empiric Cefepime + Vancomycin  Meets SIRS Criteria (HR 108, RR 21) Concern for possible Healthcare Associated Pneumonia vs Aspiration -Monitor fever curve -Trend WBC's & Procalcitonin -Follow cultures as above -Continue empiric Cefepime & Vancomycin pending cultures & sensitivities  Chronic HFrEF without acute Exacerbation PMHx of Hypertension -Continuous cardiac monitoring -Maintain MAP >65 -Vasopressors as needed to maintain MAP goal -Echocardiogram on 02/16/2021 with LVEF 60 to 65%, indeterminate diastolic parameters, normal right ventricular systolic function -Will hold outpatient Lasix, Metoprolol, ARB and Spironolactone for now due to soft BP's  Acute Metabolic Encephalopathy due to Hypercapnia PMHx of Depression -Treat Hypercapnia with ventilator -Provide supportive care -Promote normal sleep/wake cycle -Avoid sedating meds as able -Check UDS -Can resume home Zoloft once mentation improves  Hyperglycemia -CBG's q4h; Target range of 140 to 180 -SSI -Follow ICU Hypo/Hyperglycemia protocol   Best Practice (right click and "Reselect all SmartList Selections" daily)   Diet/type: NPO DVT prophylaxis: LMWH GI prophylaxis: H2B Lines: N/A Foley:  N/A Code Status:  full code Last date of multidisciplinary goals of care discussion [N/A]  Labs   CBC: Recent Labs  Lab 06/19/21 0946  WBC 10.8*  NEUTROABS 8.4*  HGB 13.8  HCT 44.0  MCV 98.4  PLT 101    Basic Metabolic Panel: Recent Labs  Lab 06/19/21 0946  NA 140  K 4.7  CL 89*  CO2 43*  GLUCOSE 196*  BUN 24*  CREATININE 0.96  CALCIUM 9.4   GFR: Estimated Creatinine Clearance: 116.8 mL/min (by C-G formula based on SCr of 0.96 mg/dL). Recent Labs  Lab 06/19/21 0946  WBC 10.8*  LATICACIDVEN 1.0     Liver Function Tests: Recent  Labs  Lab 06/19/21 0946  AST 21  ALT 20  ALKPHOS 65  BILITOT 0.8  PROT 7.9  ALBUMIN 4.0   No results for input(s): LIPASE, AMYLASE in the last 168 hours. No results for input(s): AMMONIA in the last 168 hours.  ABG    Component Value Date/Time   PHART 7.48 (H) 05/14/2021 0831   PCO2ART 53 (H) 05/14/2021 0831   PO2ART 71 (L) 05/14/2021 0831   HCO3 47.6 (H) 06/19/2021 0944   O2SAT 30.1 06/19/2021 0944     Coagulation Profile: Recent Labs  Lab 06/19/21 0946  INR 1.0    Cardiac Enzymes: No results for input(s): CKTOTAL, CKMB, CKMBINDEX, TROPONINI in the last 168 hours.  HbA1C: Hgb A1c MFr Bld  Date/Time Value Ref Range Status  04/29/2021 05:02 AM 5.6 4.8 - 5.6 % Final    Comment:    (NOTE) Pre diabetes:          5.7%-6.4%  Diabetes:              >6.4%  Glycemic control for   <7.0% adults with diabetes   03/02/2021 04:52 AM 5.9 (H) 4.8 - 5.6 % Final    Comment:    (NOTE)         Prediabetes: 5.7 - 6.4         Diabetes: >6.4         Glycemic control for adults with diabetes: <7.0     CBG: No results for input(s): GLUCAP in the last 168 hours.  Review of Systems:   Unable to assess due to AMS  Past Medical History:  He,  has a past medical history of Adenomatous colon polyp, Collapsed lung (08/2020), Depression, Ear drum perforation, GERD (gastroesophageal reflux disease), Hyperlipidemia, Hypertension, Pneumonia, and T8 vertebral fracture ([place]).   Surgical History:   Past Surgical History:  Procedure Laterality Date   CARPAL TUNNEL RELEASE     left hand   COLONOSCOPY  09/18/2009, 09/15/2014   COLONOSCOPY WITH PROPOFOL N/A 12/29/2017   Procedure: COLONOSCOPY WITH PROPOFOL;  Surgeon: Manya Silvas, MD;  Location: [facility] ENDOSCOPY;  Service: Endoscopy;  Laterality: N/A;   EYE SURGERY     FRACTURE SURGERY     HERNIA REPAIR     LIPOMA EXCISION     SPINE SURGERY     TRACHEOSTOMY TUBE PLACEMENT N/A 03/07/2021    Procedure: TRACHEOSTOMY;  Surgeon: Margaretha Sheffield, MD;  Location: [facility] ORS;  Service: ENT;  Laterality: N/A;     Social History:   reports that he has never smoked. He has never used smokeless tobacco. He reports that he does not drink alcohol and does not use drugs.   Family History:  His family history includes Cancer in his mother; Varicose Veins in his mother.   Allergies Allergies  Allergen Reactions   Amlodipine Swelling    Ankle swelling    Divalproex Sodium Other (See Comments)    Elevated liver enzymes     Home Medications  Prior to Admission medications   Medication Sig Start Date End Date Taking? Authorizing Provider  azithromycin (ZITHROMAX) 250 MG tablet Take 250 mg by mouth 3 (three) times a week. 06/16/21   [provider]  famotidine (PEPCID) 20 MG tablet Take 1 tablet (20 mg total) by mouth 2 (two) times daily. 05/04/21 06/03/21  Max Sane, MD  furosemide (LASIX) 40 MG tablet Take 1 tablet (40 mg total) by mouth daily. 05/18/21   Flora Lipps, MD  glycopyrrolate (ROBINUL) 1 MG tablet  Take 0.5 mg by mouth 3 (three) times daily. 06/15/21   [provider]  ipratropium-albuterol (DUONEB) 0.5-2.5 (3) MG/3ML SOLN Take 3 mLs by nebulization every 4 (four) hours as needed. 05/17/21   Flora Lipps, MD  metoprolol tartrate (LOPRESSOR) 25 MG tablet Take 1 tablet (25 mg total) by mouth daily. 05/18/21   Flora Lipps, MD  sertraline (ZOLOFT) 25 MG tablet Take 1 tablet (25 mg total) by mouth daily. 05/18/21   Flora Lipps, MD     Critical care time: 55 minutes     Darel Hong, AGACNP-BC [facility] Prefer epic messenger for cross cover needs If after hours, please call E-link

## 2021-06-19 NOTE — Consult Note (Signed)
CODE SEPSIS - PHARMACY COMMUNICATION  **Broad Spectrum Antibiotics should be administered within 1 hour of Sepsis diagnosis**  Time Code Sepsis Called/Page Received: 7308  Antibiotics Ordered:  Cefepime vancomycin  Time of 1st antibiotic administration: 1025  Additional action taken by pharmacy: NONE   Junette Bernat Rodriguez-Guzman PharmD, BCPS 06/19/2021 9:54 AM

## 2021-06-19 NOTE — Sepsis Progress Note (Signed)
ELink monitoring sepsis protocol 

## 2021-06-19 NOTE — Progress Notes (Signed)
Pt. Found disconnected  from vent,hr 51,sat  was not picking up,pt diaphoretic and cyanotic and unresponsive. Pt. Was reconnected to vent with 100% fio2. MD was called to room and ordered an ABG.

## 2021-06-20 ENCOUNTER — Inpatient Hospital Stay: Payer: PPO

## 2021-06-20 DIAGNOSIS — L8990 Pressure ulcer of unspecified site, unspecified stage: Secondary | ICD-10-CM | POA: Insufficient documentation

## 2021-06-20 DIAGNOSIS — J9622 Acute and chronic respiratory failure with hypercapnia: Secondary | ICD-10-CM | POA: Diagnosis not present

## 2021-06-20 LAB — URINE CULTURE: Culture: NO GROWTH

## 2021-06-20 LAB — PROCALCITONIN: Procalcitonin: 0.32 ng/mL

## 2021-06-20 LAB — CBC
HCT: 37.3 % — ABNORMAL LOW (ref 39.0–52.0)
Hemoglobin: 12.4 g/dL — ABNORMAL LOW (ref 13.0–17.0)
MCH: 32.2 pg (ref 26.0–34.0)
MCHC: 33.2 g/dL (ref 30.0–36.0)
MCV: 96.9 fL (ref 80.0–100.0)
Platelets: 169 10*3/uL (ref 150–400)
RBC: 3.85 MIL/uL — ABNORMAL LOW (ref 4.22–5.81)
RDW: 13.3 % (ref 11.5–15.5)
WBC: 13.7 10*3/uL — ABNORMAL HIGH (ref 4.0–10.5)
nRBC: 0 % (ref 0.0–0.2)

## 2021-06-20 LAB — BASIC METABOLIC PANEL
Anion gap: 9 (ref 5–15)
BUN: 29 mg/dL — ABNORMAL HIGH (ref 8–23)
CO2: 36 mmol/L — ABNORMAL HIGH (ref 22–32)
Calcium: 9.1 mg/dL (ref 8.9–10.3)
Chloride: 93 mmol/L — ABNORMAL LOW (ref 98–111)
Creatinine, Ser: 0.99 mg/dL (ref 0.61–1.24)
GFR, Estimated: >60 mL/min (ref >60)
Glucose, Bld: 167 mg/dL — ABNORMAL HIGH (ref 70–99)
Potassium: 4.2 mmol/L (ref 3.5–5.1)
Sodium: 138 mmol/L (ref 135–145)

## 2021-06-20 LAB — LEGIONELLA PNEUMOPHILA SEROGP 1 UR AG: L. pneumophila Serogp 1 Ur Ag: NEGATIVE

## 2021-06-20 MED ORDER — ALBUTEROL SULFATE (2.5 MG/3ML) 0.083% IN NEBU
2.5000 mg | INHALATION_SOLUTION | RESPIRATORY_TRACT | Status: DC | PRN
  Administered 2021-06-20 – 2021-06-22 (×2): 2.5 mg via RESPIRATORY_TRACT
  Filled 2021-06-20 (×2): qty 3

## 2021-06-20 MED ORDER — ACETYLCYSTEINE 20 % IN SOLN
2.0000 mL | Freq: Three times a day (TID) | RESPIRATORY_TRACT | Status: DC
  Administered 2021-06-20 – 2021-06-22 (×5): 2 mL via RESPIRATORY_TRACT
  Filled 2021-06-20 (×5): qty 4

## 2021-06-20 NOTE — Progress Notes (Signed)
NAME:  Eddie Chapman, MRN:  [id number], DOB:  Feb 26, 1961, LOS: 1 ADMISSION DATE:  06/19/2021, CONSULTATION DATE:  06/19/2021 REFERRING MD:  [name], CHIEF COMPLAINT:  Altered Mental Status, shortness of breath   Brief Pt Description / Synopsis:  60 year old male admitted with acute metabolic encephalopathy and acute on chronic hypoxic & hypercapnic respiratory failure in setting of suspected healthcare associated pneumonia along with noncompliance with home trilogy, requiring mechanical ventilation via chronic tracheostomy.  History of Present Illness:  60 year old male with a past medical history significant for acute on chronic hypoxic and hypercapnic respiratory failure with trach dependence, HFrEF, obstructive sleep apnea, obesity hypoventilation syndrome, pulmonary hypertension, and previous noncompliance with home Trilogy who presented to [facility] ED on 06/19/2021 due to complaints of shortness of breath and confusion.  Patient is currently altered/lethargic and no family is currently available, therefore history is obtained from ED nursing notes.  Per notes it is reported he has become increasingly confused and lethargic over the past 24 hours.  This morning he exhibited increased work of breathing along with the confusion of which EMS was dispatched.  EMS found him hypoxic with O2 saturations in the 70's.  It is reported that he forgot to use him home Trilogy last night.    Of note he has had multiple recent hospitalizations, with his most recent admission at [facility] from 05/10/21 throughout 05/17/21 for acute on chronic hypoxic & Hypercapnic Respiratory Failure.   He followed up with Pulmonologist Dr. [name] on 06/15/21, where he was started on Azithromycin for Tracheitis vs Pneumonia.  Given his work of breathing and copious thick secretions from his Lurline Idol, he was placed on his Trilogy vent. He met SIRS Criteria, and given concern for possible pneumonia, sepsis workup was initiated and he was  given broad spectrum antibiotics including IV Cefepime and Vancomycin.  He also received 125 mg IV Solumedrol and bronchodilators.  PCCM is asked to admit the patient to ICU for further workup and treatment of acute metabolic encephalopathy and acute on chronic hypoxic & hypercapnic respiratory failure in setting of suspected healthcare associated pneumonia along with noncompliance with home trilogy, requiring mechanical ventilation via chronic tracheostomy.  Pertinent  Medical History  Acute on chronic hypoxic hypercapnic respiratory failure Tracheostomy dependence Obstructive sleep apnea Obesity hypoventilation syndrome Pulmonary Hypertension Hypertension  Hyperlipidemia HFrEF Pneumonia Depression GERD T8 Vertebral Fracture  Micro Data:  06/19/2021: SARS-CoV-2 and influenza PCR>> negative 06/19/2021: Blood culture>> 06/19/2021: Urine>> 06/19/2021: Sputum>> 06/19/2021: Strep pneumo urinary antigen>> 06/19/2021: Legionella urinary antigen>>  Antimicrobials:  Cefepime 9/20>> Vancomycin 9/20>>  Significant Hospital Events: Including procedures, antibiotic start and stop dates in addition to other pertinent events   06/19/2021: Presented to ED due to shortness of breath and altered mental status.  Found to be hypercapnic requiring mechanical ventilation via chronic tracheostomy.  PCCM asked to admit  Interim History / Subjective:   Remains on the ventilator  Objective   Blood pressure 128/75, pulse (!) 129, temperature 98.5 F (36.9 C), temperature source Oral, resp. rate (!) 27, height 6' (1.829 m), weight (!) 155.5 kg, SpO2 90 %.    Vent Mode: PRVC FiO2 (%):  [30 %-100 %] 45 % Set Rate:  [16 bmp] 16 bmp Vt Set:  [500 mL] 500 mL PEEP:  [5 cmH20] 5 cmH20 Plateau Pressure:  [23 cmH20] 23 cmH20   Intake/Output Summary (Last 24 hours) at 06/20/2021 0715 Last data filed at 06/20/2021 4097 Gross per 24 hour  Intake 1700 ml  Output 450 ml  Net 1250 ml   Filed Weights   06/19/21  1004 06/19/21 1840 06/20/21 0500  Weight: (!) 146 kg (!) 158.9 kg (!) 155.5 kg    Examination: Gen:      No acute distress, acute on chronic ill-appearing gentleman HEENT:  EOMI, sclera anicteric Neck:     No masses; no thyromegaly tracheostomy Lungs:   Rhonchi CV:         Regular rate and rhythm; no murmurs Abd:      + bowel sounds; soft, non-tender; no palpable masses, no distension Ext:    No edema; adequate peripheral perfusion Skin:      Warm and dry; no rash Neuro: Sedated  Lab/imaging reviewed Significant for glucose elevated to 341, lactic acid 1.6, procalcitonin 0.32 WBC elevated to 13.7 Chest x-ray today with left lung hemi opacification   Resolved Hospital Problem list     Assessment & Plan:  Acute on Chronic Hypoxic & Hypercapnic Respiratory Failure due to potential Healthcare Associated Pneumonia vs Aspiration, & noncompliance with home Trilogy PMHx of Tracheostomy dependence, Obstructive Sleep Apnea, Obesity Hypoventilation syndrome, Pulmonary Hypertension Chest x-ray today reviewed with left lung him opacification, likely mucous plug Continue vent support, empiric cefepime, vancomycin Follow cultures Start Mucomyst, chest PT and bronchial hygiene for mucous plugging May need bronchoscopy if he does not open up Follow chest x-ray  Sepsis present on admission secondary to hospital-acquired pneumonia Concern for possible Healthcare Associated Pneumonia vs Aspiration Antibiotics as above, follow culture Check tracheal aspirate  Chronic HFrEF without acute Exacerbation PMHx of Hypertension Telemetry monitoring -Echocardiogram on 02/16/2021 with LVEF 60 to 65%, indeterminate diastolic parameters, normal right ventricular systolic function -Will hold outpatient Lasix, Metoprolol, ARB and Spironolactone for now due to soft BP's  Acute Metabolic Encephalopathy due to Hypercapnia PMHx of Depression -Treat Hypercapnia with ventilator -Provide supportive  care -Promote normal sleep/wake cycle -Avoid sedating meds as able -Check UDS -Can resume home Zoloft once mentation improves  Hyperglycemia -CBG's q4h; Target range of 140 to 180 -SSI -Follow ICU Hypo/Hyperglycemia protocol   Best Practice (right click and "Reselect all SmartList Selections" daily)   Diet/type: NPO DVT prophylaxis: LMWH GI prophylaxis: H2B Lines: N/A Foley:  N/A Code Status:  full code Last date of multidisciplinary goals of care discussion [N/A]  Critical care time:    The patient is critically ill with multiple organ system failure and requires high complexity decision making for assessment and support, frequent evaluation and titration of therapies, advanced monitoring, review of radiographic studies and interpretation of complex data.   Critical Care Time devoted to patient care services, exclusive of separately billable procedures, described in this note is 45 minutes.   Marshell Garfinkel MD [facility] See Amion for pager  If no response to pager , please call [phone] until 7pm After 7:00 pm call Elink  [phone] 06/20/2021, 7:23 AM

## 2021-06-20 NOTE — Progress Notes (Signed)
Patient noted to have copious secretions coming from trach. Color is yellow. Suctioned many times throughout the night. Otherwise, he rested comfortably. Able to make needs known. Afebrile. Continues on abt with no adverse reactions noted. Call bell in reach. Will continue to monitor.

## 2021-06-20 NOTE — Consult Note (Signed)
PHARMACY CONSULT NOTE - FOLLOW UP  Pharmacy Consult for Electrolyte Monitoring and Replacement   Recent Labs: Potassium (mmol/L)  Date Value  06/20/2021 4.2  05/04/2014 4.3   Magnesium (mg/dL)  Date Value  05/15/2021 2.2   Calcium (mg/dL)  Date Value  06/20/2021 9.1   Calcium, Total (mg/dL)  Date Value  05/04/2014 9.1   Albumin (g/dL)  Date Value  06/19/2021 4.0   Phosphorus (mg/dL)  Date Value  05/15/2021 3.9   Sodium (mmol/L)  Date Value  06/20/2021 138  05/04/2014 140     Assessment: Patient admitted with altered mental status and shortness of breath. Patient with chronic tracheostomy, requiring ventilator support. Pharmacy was consulted for electrolyte management.  Goal of Therapy:  Electrolytes WNL  Plan:  --No supplementation indicated at this time --Continue to follow along  Tawnya Crook, PharmD, BCPS Clinical Pharmacist 06/20/2021 11:49 AM

## 2021-06-20 NOTE — Progress Notes (Signed)
Inpatient Diabetes Program Recommendations  AACE/ADA: New Consensus Statement on Inpatient Glycemic Control (2015)  Target Ranges:  Prepandial:   less than 140 mg/dL      Peak postprandial:   less than 180 mg/dL (1-2 hours)      Critically ill patients:  140 - 180 mg/dL   Lab Results  Component Value Date   GLUCAP 341 (H) 06/19/2021   HGBA1C 5.6 04/29/2021    Review of Glycemic Control  Inpatient Diabetes Program Recommendations:   Consider while on steroids: -ICU Glycemic control order set Or Glycemic control order set with Novolog correction 0-6 units q 4 hrs. While NPO.  Thank you, Nani Gasser. Eddie Pennings, RN, MSN, CDE  Diabetes Coordinator Inpatient Glycemic Control Team Team Pager [phone] (8am-5pm) 06/20/2021 11:53 AM

## 2021-06-21 DIAGNOSIS — J9622 Acute and chronic respiratory failure with hypercapnia: Secondary | ICD-10-CM | POA: Diagnosis not present

## 2021-06-21 DIAGNOSIS — J9621 Acute and chronic respiratory failure with hypoxia: Secondary | ICD-10-CM | POA: Diagnosis not present

## 2021-06-21 DIAGNOSIS — J189 Pneumonia, unspecified organism: Secondary | ICD-10-CM | POA: Diagnosis not present

## 2021-06-21 LAB — CBC
HCT: 37.7 % — ABNORMAL LOW (ref 39.0–52.0)
Hemoglobin: 12.1 g/dL — ABNORMAL LOW (ref 13.0–17.0)
MCH: 31.3 pg (ref 26.0–34.0)
MCHC: 32.1 g/dL (ref 30.0–36.0)
MCV: 97.7 fL (ref 80.0–100.0)
Platelets: 142 10*3/uL — ABNORMAL LOW (ref 150–400)
RBC: 3.86 MIL/uL — ABNORMAL LOW (ref 4.22–5.81)
RDW: 13.5 % (ref 11.5–15.5)
WBC: 13.3 10*3/uL — ABNORMAL HIGH (ref 4.0–10.5)
nRBC: 0 % (ref 0.0–0.2)

## 2021-06-21 LAB — BASIC METABOLIC PANEL
Anion gap: 9 (ref 5–15)
BUN: 28 mg/dL — ABNORMAL HIGH (ref 8–23)
CO2: 32 mmol/L (ref 22–32)
Calcium: 8.9 mg/dL (ref 8.9–10.3)
Chloride: 95 mmol/L — ABNORMAL LOW (ref 98–111)
Creatinine, Ser: 0.95 mg/dL (ref 0.61–1.24)
GFR, Estimated: >60 mL/min (ref >60)
Glucose, Bld: 139 mg/dL — ABNORMAL HIGH (ref 70–99)
Potassium: 3.7 mmol/L (ref 3.5–5.1)
Sodium: 136 mmol/L (ref 135–145)

## 2021-06-21 LAB — PROCALCITONIN: Procalcitonin: 0.24 ng/mL

## 2021-06-21 LAB — MAGNESIUM: Magnesium: 2 mg/dL (ref 1.7–2.4)

## 2021-06-21 LAB — PHOSPHORUS: Phosphorus: 3.3 mg/dL (ref 2.5–4.6)

## 2021-06-21 MED ORDER — SERTRALINE HCL 50 MG PO TABS
25.0000 mg | ORAL_TABLET | Freq: Every day | ORAL | Status: DC
  Administered 2021-06-21 – 2021-06-28 (×8): 25 mg via ORAL
  Filled 2021-06-21 (×8): qty 1

## 2021-06-21 MED ORDER — FLUTICASONE PROPIONATE 50 MCG/ACT NA SUSP
1.0000 | Freq: Every day | NASAL | Status: DC
  Administered 2021-06-21 – 2021-06-26 (×6): 1 via NASAL
  Filled 2021-06-21: qty 16

## 2021-06-21 MED ORDER — OXYCODONE HCL 5 MG PO TABS
5.0000 mg | ORAL_TABLET | ORAL | Status: DC | PRN
Start: 2021-06-21 — End: 2021-06-28
  Administered 2021-06-21 – 2021-06-28 (×12): 5 mg via ORAL
  Filled 2021-06-21 (×13): qty 1

## 2021-06-21 MED ORDER — CHOLESTYRAMINE 4 G PO PACK
4.0000 g | PACK | Freq: Two times a day (BID) | ORAL | Status: DC
  Administered 2021-06-22 – 2021-06-28 (×12): 4 g via ORAL
  Filled 2021-06-21 (×15): qty 1

## 2021-06-21 MED ORDER — ASPIRIN EC 81 MG PO TBEC
81.0000 mg | DELAYED_RELEASE_TABLET | Freq: Every day | ORAL | Status: DC
  Administered 2021-06-21 – 2021-06-28 (×8): 81 mg via ORAL
  Filled 2021-06-21 (×8): qty 1

## 2021-06-21 MED ORDER — METOPROLOL TARTRATE 25 MG PO TABS
25.0000 mg | ORAL_TABLET | Freq: Every day | ORAL | Status: DC
  Administered 2021-06-21 – 2021-06-28 (×8): 25 mg via ORAL
  Filled 2021-06-21 (×8): qty 1

## 2021-06-21 MED ORDER — FUROSEMIDE 20 MG PO TABS
40.0000 mg | ORAL_TABLET | Freq: Every day | ORAL | Status: DC
  Administered 2021-06-21 – 2021-06-28 (×8): 40 mg via ORAL
  Filled 2021-06-21 (×8): qty 2

## 2021-06-21 NOTE — TOC Initial Note (Signed)
Transition of Care [facility]) - Initial/Assessment Note    Patient Details  Name: Eddie Chapman MRN: [id number] Date of Birth: 02-Mar-1961  Transition of Care [facility]) CM/SW Contact:    Kerin Salen, RN Phone Number: 06/21/2021, 3:32 PM  Clinical Narrative:  Spoke with patient and significant other at bedside. Patient alert and oriented x4, says he was not able to use Trilogy at home due to needing another valve. Patient and significant other Eddie Chapman says [facility] services started week following discharge, however no PT services. Continues to use [facility] for medication refills, no problems with getting meds. Cooking and shopping done by Eddie Chapman.   Spoke with [employer], who reports no Trilogy activity since 9/14, service representative did make home visit to assess equipment and education support for patient and Eddie Chapman.  TOC to continue to track for discharge needs.         Expected Discharge Plan: [place] Barriers to Discharge: Continued Medical Work up   Patient Goals and CMS Choice Patient states their goals for this hospitalization and ongoing recovery are:: To return home      Expected Discharge Plan and Services Expected Discharge Plan: [place] In-house Referral: Clinical Social Work                       DME Arranged: Ventilator DME Agency: [employer] Date DME Agency Contacted: 06/21/21 Time DME Agency Contacted: [phone]              Prior Living Arrangements/Services   Lives with:: Significant Other Patient language and need for interpreter reviewed:: Yes Do you feel safe going back to the place where you live?: Yes      Need for Family Participation in Patient Care: Yes (Comment) Care giver support system in place?: Yes (comment) Current home services: Home PT, Home OT Criminal Activity/Legal Involvement Pertinent to Current Situation/Hospitalization: No - Comment as needed  Activities of Daily Living Home Assistive  Devices/Equipment: None ADL Screening (condition at time of admission) Patient's cognitive ability adequate to safely complete daily activities?: Yes Is the patient deaf or have difficulty hearing?: Yes Does the patient have difficulty seeing, even when wearing glasses/contacts?: No Does the patient have difficulty concentrating, remembering, or making decisions?: No Patient able to express need for assistance with ADLs?: No Does the patient have difficulty dressing or bathing?: No Independently performs ADLs?: Yes (appropriate for developmental age) Does the patient have difficulty walking or climbing stairs?: No Weakness of Legs: None Weakness of Arms/Hands: None  Permission Sought/Granted                  Emotional Assessment Appearance:: Appears stated age Attitude/Demeanor/Rapport: Engaged Affect (typically observed): Accepting Orientation: : Oriented to Self, Oriented to Place, Oriented to  Time, Oriented to Situation Alcohol / Substance Use: Not Applicable Psych Involvement: No (comment)  Admission diagnosis:  HCAP (healthcare-associated pneumonia) [J18.9] Acute on chronic respiratory failure with hypercapnia (HCC) [J96.22] Acute on chronic respiratory failure with hypoxia and hypercapnia (HCC) [A83.41, J96.22] Patient Active Problem List   Diagnosis Date Noted   Pressure injury of skin 06/20/2021   Acute on chronic respiratory failure with hypoxia and hypercapnia ([place]) 05/10/2021   Acute on chronic respiratory failure with hypercapnia (HCC) 04/25/2021   Depression    Atelectasis    Leukocytosis    Fever    Acute on chronic systolic congestive heart failure (HCC)    Acute respiratory failure with hypoxia ([place])  Palliative care by specialist    Respiratory failure with hypercapnia ([place]) 02/23/2021   Acute respiratory failure with hypoxia and hypercapnia ([place]) 10/07/2020   OSA (obstructive sleep apnea) 10/07/2020   Obesity hypoventilation syndrome ([place])  01/65/8006   Acute diastolic CHF (congestive heart failure) ([place]) 10/07/2020   Obesity, Class III, BMI 40-49.9 (morbid obesity) ([place]) 10/07/2020   Occlusion and stenosis of vertebral artery 08/19/2016   Aneurysm, cerebral, nonruptured 08/19/2016   Essential hypertension 08/19/2016   Hyperlipidemia 08/19/2016   PCP:  Adin Hector, MD Pharmacy:   [place], [place] - [place] [address] [place] [address] Phone: [phone] Fax: [phone]     Social Determinants of Health (SDOH) Interventions    Readmission Risk Interventions Readmission Risk Prevention Plan 06/21/2021  Transportation Screening Complete  PCP or Specialist Appt within 3-5 Days Complete  HRI or [place] Complete  Social Work Consult for [place] Planning/Counseling Complete  Palliative Care Screening Not Applicable  Medication Review Press photographer) Complete  Some recent data might be hidden

## 2021-06-21 NOTE — Progress Notes (Signed)
NAME:  Eddie Chapman, MRN:  [id number], DOB:  1961-07-01, LOS: 2 ADMISSION DATE:  06/19/2021, CONSULTATION DATE:  06/19/2021 REFERRING MD:  [name], CHIEF COMPLAINT:  Altered Mental Status, shortness of breath   Brief Pt Description / Synopsis:  60 year old male admitted with acute metabolic encephalopathy and acute on chronic hypoxic & hypercapnic respiratory failure in setting of suspected healthcare associated pneumonia along with noncompliance with home trilogy, requiring mechanical ventilation via chronic tracheostomy.  History of Present Illness:  60 year old male with a past medical history significant for acute on chronic hypoxic and hypercapnic respiratory failure with trach dependence, HFrEF, obstructive sleep apnea, obesity hypoventilation syndrome, pulmonary hypertension, and previous noncompliance with home Trilogy who presented to [facility] ED on 06/19/2021 due to complaints of shortness of breath and confusion.  Patient is currently altered/lethargic and no family is currently available, therefore history is obtained from ED nursing notes.  Per notes it is reported he has become increasingly confused and lethargic over the past 24 hours.  This morning he exhibited increased work of breathing along with the confusion of which EMS was dispatched.  EMS found him hypoxic with O2 saturations in the 70's.  It is reported that he forgot to use him home Trilogy last night.    Of note he has had multiple recent hospitalizations, with his most recent admission at [facility] from 05/10/21 throughout 05/17/21 for acute on chronic hypoxic & Hypercapnic Respiratory Failure.   He followed up with Pulmonologist Dr. [name] on 06/15/21, where he was started on Azithromycin for Tracheitis vs Pneumonia.  Given his work of breathing and copious thick secretions from his Lurline Idol, he was placed on his Trilogy vent. He met SIRS Criteria, and given concern for possible pneumonia, sepsis workup was initiated and he was  given broad spectrum antibiotics including IV Cefepime and Vancomycin.  He also received 125 mg IV Solumedrol and bronchodilators.  PCCM is asked to admit the patient to ICU for further workup and treatment of acute metabolic encephalopathy and acute on chronic hypoxic & hypercapnic respiratory failure in setting of suspected healthcare associated pneumonia along with noncompliance with home trilogy, requiring mechanical ventilation via chronic tracheostomy.  Pertinent  Medical History  Acute on chronic hypoxic hypercapnic respiratory failure Tracheostomy dependence Obstructive sleep apnea Obesity hypoventilation syndrome Pulmonary Hypertension Hypertension  Hyperlipidemia HFrEF Pneumonia Depression GERD T8 Vertebral Fracture  Micro Data:  06/19/2021: SARS-CoV-2 and influenza PCR>> negative 06/19/2021: Blood culture>> no growth to date 06/19/2021: Urine>>no growth 06/19/2021: Sputum>>RARE STAPHYLOCOCCUS AUREUS  SUSCEPTIBILITIES TO FOLLOW ,RARE GRAM NEGATIVE RODS  06/19/2021: Strep pneumo urinary antigen>>negative 06/19/2021: Legionella urinary antigen>>negative  Antimicrobials:  Cefepime 9/20>> Vancomycin 9/20>>  Significant Hospital Events: Including procedures, antibiotic start and stop dates in addition to other pertinent events   06/19/2021: Presented to ED due to shortness of breath and altered mental status.  Found to be hypercapnic requiring mechanical ventilation via chronic tracheostomy.  PCCM asked to admit 06/21/2021: Attempting TC trials, Speech evaluation, Sputum culture with Staph Aureus and rare gram negative rods  Interim History / Subjective:  -No acute events noted overnight -Afebrile, hemodynamically stable -Currently on Pressure Support mode on vent ~ will plan for TC trial as tolerated -Consult Speech therapy  -Tracheal aspirate with rare Staph Aureus and Gram negative rods~ cultures and sensitivity still  pending  Objective   Blood pressure (!) 168/89, pulse  (!) 120, temperature 98.8 F (37.1 C), temperature source Oral, resp. rate 19, height 6' (1.829 m), weight (!) 153 kg, SpO2 98 %.  Vent Mode: PSV;CPAP FiO2 (%):  [30 %-40 %] 30 % Set Rate:  [16 bmp] 16 bmp Vt Set:  [500 mL] 500 mL PEEP:  [5 cmH20] 5 cmH20 Pressure Support:  [5 cmH20] 5 cmH20 Plateau Pressure:  [21 cmH20] 21 cmH20   Intake/Output Summary (Last 24 hours) at 06/21/2021 0908 Last data filed at 06/21/2021 0051 Gross per 24 hour  Intake 600 ml  Output 900 ml  Net -300 ml    Filed Weights   06/19/21 1840 06/20/21 0500 06/21/21 0500  Weight: (!) 158.9 kg (!) 155.5 kg (!) 153 kg    Examination: Gen:      No acute distress, acute on chronic ill-appearing gentleman, PS mode on vent HEENT:  EOMI, sclera anicteric, atraumatic, normocephalic Neck:     No masses; no thyromegaly, tracheostomy midline Lungs:   Mechanical breath sounds bilaterally, no rales or wheezing, on PS mode, even, nonlabored CV:         Tachycardia, Regular rhythm; no murmurs/rubs/gallops Abd:      + bowel sounds; soft, non-tender; no palpable masses, no distension Ext:    No edema; adequate peripheral perfusion Skin:      Warm and dry; no rash Neuro: Awake and alert, follow commands and moves all extremities, no focal deficits, writing on clipboard to communicate, pupils [facility] Problem list     Assessment & Plan:  Acute on Chronic Hypoxic & Hypercapnic Respiratory Failure due to potential Healthcare Associated Pneumonia vs Aspiration, & noncompliance with home Trilogy PMHx of Tracheostomy dependence, Obstructive Sleep Apnea, Obesity Hypoventilation syndrome, Pulmonary Hypertension -Full vent support, implement lung protective strategies -Plateau pressures less than 30 cm H20 -Wean FiO2 & PEEP as tolerated to maintain O2 sats >92% -Follow intermittent Chest X-ray & ABG as needed -Spontaneous Breathing Trials / Trach collar trials when respiratory parameters met  -Needs Vent  at night -Implement VAP Bundle -Prn Bronchodilators -Mucomyst nebs, Chest PT, and Bronchial hygiene -Continue Cefepime & Vancomycin  Sepsis present on admission secondary to hospital-acquired pneumonia Concern for possible Healthcare Associated Pneumonia vs Aspiration -Monitor fever curve -Trend WBC's & Procalcitonin -Follow cultures as above (Tracheal aspirate with rare Staph Aureus and Gram - rods) -Continue empiric Cefepime and Vancomycin pending cultures & sensitivities  Chronic HFrEF without acute Exacerbation PMHx of Hypertension -Continuous cardiac monitoring -Maintain MAP >65 -Echocardiogram on 02/16/2021 with LVEF 60 to 65%, indeterminate diastolic parameters, normal right ventricular systolic function -BP has improved, once able to tolerate PO can resume outpatient Lasix, Metoprolol  Acute Metabolic Encephalopathy due to Hypercapnia~resolved PMHx of Depression -Treat Hypercapnia with ventilator -Provide supportive care -Promote normal sleep/wake cycle -Avoid sedating meds as able -UDS + for Cannabinoid  -Can resume home Zoloft once able to take PO  Hyperglycemia -CBG's q4h; Target range of 140 to 180 -SSI -Follow ICU Hypo/Hyperglycemia protocol   Best Practice (right click and "Reselect all SmartList Selections" daily)   Diet/type: NPO, SLP evaluation pending DVT prophylaxis: LMWH GI prophylaxis: H2B Lines: N/A Foley:  N/A Code Status:  full code Last date of multidisciplinary goals of care discussion [06/21/2021]  Critical care time: 35 minutes   Darel Hong, AGACNP-BC [facility] Prefer epic messenger for cross cover needs If after hours, please call E-link

## 2021-06-21 NOTE — Progress Notes (Signed)
[place] Progress Note Patient Name: Eddie Chapman DOB: Sep 28, 1961 MRN: [id number]   Date of Service  06/21/2021  HPI/Events of Note  Patient c/o low back pain d/t herniated disc. Tylenol not effective.  eICU Interventions  Plan: Oxycodone IR 5 mg PO Q 4 hours PRN moderate or severe pain.     Intervention Category Major Interventions: Other:  Lysle Dingwall 06/21/2021, 7:43 PM

## 2021-06-21 NOTE — Consult Note (Signed)
PHARMACY CONSULT NOTE - FOLLOW UP  Pharmacy Consult for Electrolyte Monitoring and Replacement   Recent Labs: Potassium (mmol/L)  Date Value  06/21/2021 3.7  05/04/2014 4.3   Magnesium (mg/dL)  Date Value  06/21/2021 2.0   Calcium (mg/dL)  Date Value  06/21/2021 8.9   Calcium, Total (mg/dL)  Date Value  05/04/2014 9.1   Albumin (g/dL)  Date Value  06/19/2021 4.0   Phosphorus (mg/dL)  Date Value  06/21/2021 3.3   Sodium (mmol/L)  Date Value  06/21/2021 136  05/04/2014 140     Assessment: Patient admitted with altered mental status and shortness of breath. Patient with chronic tracheostomy, requiring ventilator support. Pharmacy was consulted for electrolyte management.  Goal of Therapy:  Electrolytes WNL  Plan:  --No supplementation indicated at this time --Continue to follow along  Tawnya Crook, PharmD, BCPS Clinical Pharmacist 06/21/2021 2:56 PM

## 2021-06-21 NOTE — Evaluation (Addendum)
Passy-Muir Speaking Valve - Evaluation Patient Details  Name: Eddie Chapman MRN: [id number] Date of Birth: 16-Feb-1961  Today's Date: 06/21/2021 Time: 3419-6222 SLP Time Calculation (min) (ACUTE ONLY): 50 min  Past Medical History:  Past Medical History:  Diagnosis Date   Adenomatous colon polyp    Collapsed lung 08/2020   left   Depression    Ear drum perforation    left x2    GERD (gastroesophageal reflux disease)    Hyperlipidemia    Hypertension    Pneumonia    T8 vertebral fracture [facility])    Past Surgical History:  Past Surgical History:  Procedure Laterality Date   CARPAL TUNNEL RELEASE     left hand   COLONOSCOPY  09/18/2009, 09/15/2014   COLONOSCOPY WITH PROPOFOL N/A 12/29/2017   Procedure: COLONOSCOPY WITH PROPOFOL;  Surgeon: Manya Silvas, MD;  Location: [facility] ENDOSCOPY;  Service: Endoscopy;  Laterality: N/A;   EYE SURGERY     FRACTURE SURGERY     HERNIA REPAIR     LIPOMA EXCISION     SPINE SURGERY     TRACHEOSTOMY TUBE PLACEMENT N/A 03/07/2021   Procedure: TRACHEOSTOMY;  Surgeon: Margaretha Sheffield, MD;  Location: [facility] ORS;  Service: ENT;  Laterality: N/A;   HPI:  This is a 60 yo male with complex comorbid history including systolic CHF chronically, pulmonary hypertension, obstructive sleep apnea, Morbid Obesity hypoventilation syndrome, chronic tracheostomy, and COPD.  Pt has had multiple hospitalizations w/ recent admit to [facility] then unsuccessful discharge home d/t pt/family reportedly Not knowing how to manage the home vent and pt's tracheostomy care.  Pt is noncompliant with home vent per chart.  Admitting Dxs: Acute on chronic respiratory failure,  Noncompliant with home vent  Mucous plugging.  Pt admitted to ICU and placed on full vent support initially for further workup and treatment of acute metabolic encephalopathy and acute on chronic hypoxic & hypercapnic respiratory failure in setting of suspected healthcare associated pneumonia along with noncompliance with home  trilogy, requiring mechanical ventilation via chronic tracheostomy.  CXR: Improved aeration in the left upper lung may represent  re-expansion of the left upper lobe following bronchoscopy or  suction through the tracheostomy.  2. Persistent volume loss in the left mid and lower lung with right  to left shift of the cardiac and mediastinal structures indicating  residual atelectasis/collapse.  3. Loculated pleural fluid versus pleural thickening along the  lateral aspect of the right lung.  4. Aortic atherosclerotic calcifications.  5. Chronic bronchial wall  thickening and pulmonary vascular congestion.    Assessment / Plan / Recommendation  Clinical Impression  Pt has been seen by this services multiple times when admitted for both PMV and BSE. He is chronic, trach dependent and is suppose to be on his vent at night but has not been compliant w/ this. Pt has tolerated wearing the PMV previously and states he wears the PMV when he eats at home. At this evaluation today, he demonstrates wear/use of Passy-muir valve(PMV) close to/at his Baseline from previous admits. Pt's trach is a Shiley #6, XLT, Cuffed - pt is on nocturnal vent support and will continue w/ such per MD and will require a Cuffed trach now and at discharge.   Per RT, pt has tolerated trach collar O2 support at 40% FiO2 per NSG/RT today - ~5+ hours. Tracheal secretions minimal w/ PRN suctioning. Pt resting in bed. Respiratory status appears comfortable. PMV placed post checking for full cuff deflation -- had pt check as  well. While wearing the PMV, RR 19-26, O2 sats 92-96%, HR 111+. Pt able to verbalize appropriately w/ strong vocal quality, though a gravely quality w/ somewhat decreased breath support; coversation was restricted to 2-4 words at a time d/t restricted breath support. Also noted mild, dry cough (hacking cough) initially which calmed as pt practiced easy breathing strategies w/ SLP. Encouraged pt to rest calmly w/ less  conversation initially as he adjusted to the wearing the PMV. Pt verbalized his wants/needs; noted easily distracted at times (as at prior admits d/t h/o TBI).    Recommend continued ST f/u for ongoing PMV education x1-2 sessions; toleration of both PMV use/wear and w/ his oral diet when ordered by MD.  NSG and MD updated on POC. ST services will f/u during admit. Precautions posted in room; chart.  SLP Visit Diagnosis: Aphonia (R49.1) (tracheostomy)    SLP Assessment  Patient needs continued Speech Lanaguage Pathology Services    Recommendations for follow up therapy are one component of a multi-disciplinary discharge planning process, led by the attending physician.  Recommendations may be updated based on patient status, additional functional criteria and insurance authorization.  Follow Up Recommendations  None (TBD)    Frequency and Duration min 2x/week  1 week    PMSV Trial PMSV was placed for: 30 mins Able to redirect subglottic air through upper airway: Yes Able to Attain Phonation: Yes Voice Quality:  (gravely - baseline) Able to Expectorate Secretions: No attempts (mild cough) Level of Secretion Expectoration with PMSV: Oral Breath Support for Phonation: Adequate (though easily SOB w/ any exertion) Intelligibility: Intelligible Respirations During Trial: 21 SpO2 During Trial: 94 % Pulse During Trial: 111 Behavior: Alert;Good eye contact;Responsive to questions (distracted)   Tracheostomy Tube  Additional Tracheostomy Tube Assessment Trach Collar Period: ~5 hours+ today Secretion Description: min Frequency of Tracheal Suctioning: PRN Level of Secretion Expectoration: Tracheal    Vent Dependency  Vent Dependent: No FiO2 (%): (S) 50 %    Cuff Deflation Trial Tolerated Cuff Deflation: Yes Length of Time for Cuff Deflation Trial: baseline Behavior: Alert;Cooperative;Good eye contact;Responsive to questions (distracted) Cuff Deflation Trial - Comments: cuff deflated  at baseline    GO         Airyn Ellzey 06/21/2021, 4:27 PM Orinda Kenner, MS, [place] Speech Language Pathologist Rehab Services [phone]

## 2021-06-21 NOTE — Consult Note (Signed)
Pharmacy Antibiotic Note  Eddie Chapman is a 60 y.o. male admitted on 06/19/2021 with acute hypoxic respiratory failure requiring ventilatory support on admission. Patient with chronic tracheostomy. Concern for pneumonia. Respiratory culture with staph aureus and pseudomonas, sensitivities pending. MRSA PCR is positive. Pharmacy has been consulted for Cefepime and Vancomycin dosing.  Plan: Continue cefepime 2 gm every 8 hours  Continue vancomycin 2000 mg IV Q 12 hrs. Given MRSA PCR positive and staph aureus in respiratory culture, will obtain vancomycin levels with next two doses for anticipated continuation of therapy. Peak and trough levels ordered.  Height: 6' (182.9 cm) Weight: (!) 153 kg (337 lb 4.9 oz) IBW/kg (Calculated) : 77.6  Temp (24hrs), Avg:98.6 F (37 C), Min:98 F (36.7 C), Max:99 F (37.2 C)  Recent Labs  Lab 06/19/21 0946 06/19/21 1408 06/20/21 0442 06/21/21 0441  WBC 10.8*  --  13.7* 13.3*  CREATININE 0.96  --  0.99 0.95  LATICACIDVEN 1.0 1.6  --   --      Estimated Creatinine Clearance: 124.5 mL/min (by C-G formula based on SCr of 0.95 mg/dL).    Allergies  Allergen Reactions   Amlodipine Swelling    Ankle swelling    Divalproex Sodium Other (See Comments)    Elevated liver enzymes    Antimicrobials this admission: Cefepime 9/20 >>  Vancomycin 9/20 >>   Microbiology results: 9/21 Resp cx: staph aureus, pseudomonas 9/20 Bcx: NG 9/20 Ucx: NG 9/20 MRSA PCR: positive  Thank you for allowing pharmacy to be a part of this patient's care.  Tawnya Crook, PharmD, BCPS Clinical Pharmacist 06/21/2021 2:49 PM

## 2021-06-21 NOTE — Progress Notes (Signed)
eLink Physician-Brief Progress Note Patient Name: Eddie Chapman DOB: [birth year] MRN: [id number]   Date of Service  06/21/2021  HPI/Events of Note  Multiple issues: 1. Patient request for home Flonase nasal spray and 2. Multiple loose stools - Nursing request for Imodium.   eICU Interventions  Plan: Flonase Nasal Spray 1 spray to each nostril now and Q day.  Questran 4 gm PO now and BID.     Intervention Category Major Interventions: Other:  Lysle Dingwall 06/21/2021, 8:39 PM

## 2021-06-22 ENCOUNTER — Inpatient Hospital Stay: Payer: PPO

## 2021-06-22 DIAGNOSIS — J9621 Acute and chronic respiratory failure with hypoxia: Secondary | ICD-10-CM | POA: Diagnosis not present

## 2021-06-22 DIAGNOSIS — J189 Pneumonia, unspecified organism: Secondary | ICD-10-CM | POA: Diagnosis not present

## 2021-06-22 DIAGNOSIS — J9622 Acute and chronic respiratory failure with hypercapnia: Secondary | ICD-10-CM | POA: Diagnosis not present

## 2021-06-22 LAB — BASIC METABOLIC PANEL
Anion gap: 6 (ref 5–15)
BUN: 20 mg/dL (ref 8–23)
CO2: 33 mmol/L — ABNORMAL HIGH (ref 22–32)
Calcium: 8.6 mg/dL — ABNORMAL LOW (ref 8.9–10.3)
Chloride: 98 mmol/L (ref 98–111)
Creatinine, Ser: 0.85 mg/dL (ref 0.61–1.24)
GFR, Estimated: >60 mL/min (ref >60)
Glucose, Bld: 134 mg/dL — ABNORMAL HIGH (ref 70–99)
Potassium: 4 mmol/L (ref 3.5–5.1)
Sodium: 137 mmol/L (ref 135–145)

## 2021-06-22 LAB — CBC
HCT: 37.5 % — ABNORMAL LOW (ref 39.0–52.0)
Hemoglobin: 12 g/dL — ABNORMAL LOW (ref 13.0–17.0)
MCH: 31.2 pg (ref 26.0–34.0)
MCHC: 32 g/dL (ref 30.0–36.0)
MCV: 97.4 fL (ref 80.0–100.0)
Platelets: 148 10*3/uL — ABNORMAL LOW (ref 150–400)
RBC: 3.85 MIL/uL — ABNORMAL LOW (ref 4.22–5.81)
RDW: 13.4 % (ref 11.5–15.5)
WBC: 10.3 10*3/uL (ref 4.0–10.5)
nRBC: 0 % (ref 0.0–0.2)

## 2021-06-22 LAB — VANCOMYCIN, TROUGH: Vancomycin Tr: 22 ug/mL (ref 15–20)

## 2021-06-22 LAB — VANCOMYCIN, PEAK: Vancomycin Pk: 39 ug/mL (ref 30–40)

## 2021-06-22 MED ORDER — LOPERAMIDE HCL 2 MG PO CAPS
4.0000 mg | ORAL_CAPSULE | Freq: Four times a day (QID) | ORAL | Status: DC | PRN
  Administered 2021-06-22: 4 mg via ORAL
  Filled 2021-06-22: qty 2

## 2021-06-22 MED ORDER — BUDESONIDE 0.5 MG/2ML IN SUSP
0.5000 mg | Freq: Two times a day (BID) | RESPIRATORY_TRACT | Status: DC
  Administered 2021-06-22 – 2021-06-28 (×13): 0.5 mg via RESPIRATORY_TRACT
  Filled 2021-06-22 (×13): qty 2

## 2021-06-22 MED ORDER — LABETALOL HCL 5 MG/ML IV SOLN: 10.0000 mg | INTRAVENOUS | Status: DC | PRN

## 2021-06-22 MED ORDER — ALPRAZOLAM 0.5 MG PO TABS
0.5000 mg | ORAL_TABLET | Freq: Every evening | ORAL | Status: DC | PRN
  Administered 2021-06-22 – 2021-06-27 (×6): 0.5 mg via ORAL
  Filled 2021-06-22 (×6): qty 1

## 2021-06-22 MED ORDER — LIDOCAINE 5 % EX PTCH
1.0000 | MEDICATED_PATCH | CUTANEOUS | Status: DC
  Administered 2021-06-22 – 2021-06-27 (×6): 1 via TRANSDERMAL
  Filled 2021-06-22 (×8): qty 1

## 2021-06-22 MED ORDER — VANCOMYCIN HCL 1250 MG/250ML IV SOLN
1250.0000 mg | Freq: Two times a day (BID) | INTRAVENOUS | Status: AC
  Administered 2021-06-23 – 2021-06-25 (×7): 1250 mg via INTRAVENOUS
  Filled 2021-06-22 (×7): qty 250

## 2021-06-22 MED ORDER — ADULT MULTIVITAMIN W/MINERALS CH
1.0000 | ORAL_TABLET | Freq: Every day | ORAL | Status: DC
  Administered 2021-06-23 – 2021-06-28 (×6): 1 via ORAL
  Filled 2021-06-22 (×6): qty 1

## 2021-06-22 MED ORDER — ARFORMOTEROL TARTRATE 15 MCG/2ML IN NEBU
15.0000 ug | INHALATION_SOLUTION | Freq: Two times a day (BID) | RESPIRATORY_TRACT | Status: DC
  Administered 2021-06-22 – 2021-06-28 (×13): 15 ug via RESPIRATORY_TRACT
  Filled 2021-06-22 (×15): qty 2

## 2021-06-22 MED ORDER — ASCORBIC ACID 500 MG PO TABS
250.0000 mg | ORAL_TABLET | Freq: Two times a day (BID) | ORAL | Status: DC
  Administered 2021-06-22 – 2021-06-28 (×12): 250 mg via ORAL
  Filled 2021-06-22 (×12): qty 1

## 2021-06-22 MED ORDER — HYDRALAZINE HCL 20 MG/ML IJ SOLN
10.0000 mg | INTRAMUSCULAR | Status: DC | PRN
Start: 2021-06-22 — End: 2021-06-28

## 2021-06-22 MED ORDER — ENSURE MAX PROTEIN PO LIQD
11.0000 [oz_av] | Freq: Two times a day (BID) | ORAL | Status: DC
  Administered 2021-06-22 – 2021-06-28 (×12): 11 [oz_av] via ORAL
  Filled 2021-06-22: qty 330

## 2021-06-22 MED ORDER — REVEFENACIN 175 MCG/3ML IN SOLN
175.0000 ug | Freq: Every day | RESPIRATORY_TRACT | Status: DC
  Administered 2021-06-23 – 2021-06-28 (×6): 175 ug via RESPIRATORY_TRACT
  Filled 2021-06-22 (×8): qty 3

## 2021-06-22 NOTE — Consult Note (Addendum)
Pharmacy Antibiotic Note  Eddie Chapman is a 60 y.o. male admitted on 06/19/2021 with acute hypoxic respiratory failure requiring ventilatory support on admission. Patient with chronic tracheostomy. Concern for pneumonia. Respiratory culture with MRSA and pseudomonas. MRSA PCR is positive. Pharmacy has been consulted for Cefepime and Vancomycin dosing.  Plan: Continue cefepime 2 gm every 8 hours  Based on vancomycin levels, will adjust dose to 1250 mg IV q12h Expected AUC 455  Continue to monitor renal function  Height: 6' (182.9 cm) Weight: (!) 153 kg (337 lb 4.9 oz) IBW/kg (Calculated) : 77.6  Temp (24hrs), Avg:98.8 F (37.1 C), Min:98.2 F (36.8 C), Max:99.1 F (37.3 C)  Recent Labs  Lab 06/19/21 0946 06/19/21 1408 06/20/21 0442 06/21/21 0441 06/22/21 0241 06/22/21 1030  WBC 10.8*  --  13.7* 13.3* 10.3  --   CREATININE 0.96  --  0.99 0.95 0.85  --   LATICACIDVEN 1.0 1.6  --   --   --   --   VANCOTROUGH  --   --   --   --   --  22*  VANCOPEAK  --   --   --   --  39  --      Estimated Creatinine Clearance: 139.2 mL/min (by C-G formula based on SCr of 0.85 mg/dL).    Allergies  Allergen Reactions   Amlodipine Swelling    Ankle swelling    Divalproex Sodium Other (See Comments)    Elevated liver enzymes    Antimicrobials this admission: Cefepime 9/20 >>  Vancomycin 9/20 >>   Microbiology results: 9/21 Resp cx: MRSA, pseudomonas 9/20 Bcx: NG 9/20 Ucx: NG 9/20 MRSA PCR: positive  Thank you for allowing pharmacy to be a part of this patient's care.  Tawnya Crook, PharmD, BCPS Clinical Pharmacist 06/22/2021 11:41 AM

## 2021-06-22 NOTE — Progress Notes (Signed)
NAME:  Eddie Chapman, MRN:  [id number], DOB:  05/18/1961, LOS: 3 ADMISSION DATE:  06/19/2021, CONSULTATION DATE:  06/19/2021 REFERRING MD:  [name], CHIEF COMPLAINT:  Altered Mental Status, shortness of breath   Brief Pt Description / Synopsis:  60 year old male admitted with acute metabolic encephalopathy and acute on chronic hypoxic & hypercapnic respiratory failure in setting of suspected healthcare associated pneumonia along with noncompliance with home trilogy, requiring mechanical ventilation via chronic tracheostomy.  History of Present Illness:  60 year old male with a past medical history significant for acute on chronic hypoxic and hypercapnic respiratory failure with trach dependence, HFrEF, obstructive sleep apnea, obesity hypoventilation syndrome, pulmonary hypertension, and previous noncompliance with home Trilogy who presented to [facility] ED on 06/19/2021 due to complaints of shortness of breath and confusion.  Patient is currently altered/lethargic and no family is currently available, therefore history is obtained from ED nursing notes.  Per notes it is reported he has become increasingly confused and lethargic over the past 24 hours.  This morning he exhibited increased work of breathing along with the confusion of which EMS was dispatched.  EMS found him hypoxic with O2 saturations in the 70's.  It is reported that he forgot to use him home Trilogy last night.    Of note he has had multiple recent hospitalizations, with his most recent admission at [facility] from 05/10/21 throughout 05/17/21 for acute on chronic hypoxic & Hypercapnic Respiratory Failure.   He followed up with Pulmonologist Dr. [name] on 06/15/21, where he was started on Azithromycin for Tracheitis vs Pneumonia.  Given his work of breathing and copious thick secretions from his Lurline Idol, he was placed on his Trilogy vent. He met SIRS Criteria, and given concern for possible pneumonia, sepsis workup was initiated and he was  given broad spectrum antibiotics including IV Cefepime and Vancomycin.  He also received 125 mg IV Solumedrol and bronchodilators.  PCCM is asked to admit the patient to ICU for further workup and treatment of acute metabolic encephalopathy and acute on chronic hypoxic & hypercapnic respiratory failure in setting of suspected healthcare associated pneumonia along with noncompliance with home trilogy, requiring mechanical ventilation via chronic tracheostomy.  Pertinent  Medical History  Acute on chronic hypoxic hypercapnic respiratory failure Tracheostomy dependence Obstructive sleep apnea Obesity hypoventilation syndrome Pulmonary Hypertension Hypertension  Hyperlipidemia HFrEF Pneumonia Depression GERD T8 Vertebral Fracture  Micro Data:  06/19/2021: SARS-CoV-2 and influenza PCR>> negative 06/19/2021: Blood culture>> no growth to date 06/19/2021: Urine>>no growth 06/19/2021: Sputum>>MRSA (resistance to Ciprofloxacin, Erythromycin, Oxacillin) ,RARE Pseudomonas  06/19/2021: Strep pneumo urinary antigen>>negative 06/19/2021: Legionella urinary antigen>>negative  Antimicrobials:  Cefepime 9/20>> Vancomycin 9/20>>  Significant Hospital Events: Including procedures, antibiotic start and stop dates in addition to other pertinent events   06/19/2021: Presented to ED due to shortness of breath and altered mental status.  Found to be hypercapnic requiring mechanical ventilation via chronic tracheostomy.  PCCM asked to admit 06/21/2021: Attempting TC trials, Speech evaluation, Sputum culture with Staph Aureus and rare gram negative rods 06/22/21: Tolerating TC during day, vent at night.  Interim History / Subjective:  -No acute events reported overnight -Afebrile, hemodynamically stable -Tolerating TC during day, vent at night -Tracheal aspirate with MRSA and Pseudomonas ~ on Vancomycin & Cefepime -WBC improved to 10.3 (13.3) -Renal function stable with resumption of home Lasix yesterday    Objective   Blood pressure 111/76, pulse 95, temperature 98.2 F (36.8 C), resp. rate (!) 22, height 6' (1.829 m), weight (!) 153 kg, SpO2 92 %.  Vent Mode: PRVC FiO2 (%):  [40 %-76 %] 55 % Set Rate:  [12 bmp] 12 bmp Vt Set:  [550 mL] 550 mL PEEP:  [5 cmH20] 5 cmH20   Intake/Output Summary (Last 24 hours) at 06/22/2021 1249 Last data filed at 06/21/2021 1800 Gross per 24 hour  Intake 1900 ml  Output 1200 ml  Net 700 ml    Filed Weights   06/20/21 0500 06/21/21 0500 06/22/21 0500  Weight: (!) 155.5 kg (!) 153 kg (!) 153 kg    Examination: Gen:      No acute distress, acute on chronic ill-appearing gentleman, on Trach collar HEENT:  EOMI, sclera anicteric, atraumatic, normocephalic Neck:     No masses; no thyromegaly, tracheostomy midline Lungs:   Clear breath sounds bilaterally, no rales or wheezing, even, nonlabored CV:         Tachycardia, Regular rhythm; no murmurs/rubs/gallops Abd:      Obese, + bowel sounds; soft, non-tender; no palpable masses, no distension Ext:    No edema; adequate peripheral perfusion Skin:      Warm and dry; no rash Neuro: Awake and alert, follow commands and moves all extremities, no focal deficits, speaking with Passy-Muir valve, pupils PERRLA    Resolved Hospital Problem list     Assessment & Plan:   Acute on Chronic Hypoxic & Hypercapnic Respiratory Failure due to MRSA & Pseudomonas Pneumonia, & noncompliance with home Trilogy PMHx of Tracheostomy dependence, Obstructive Sleep Apnea, Obesity Hypoventilation syndrome, Pulmonary Hypertension -Full vent support, implement lung protective strategies -Plateau pressures less than 30 cm H20 -Wean FiO2 & PEEP as tolerated to maintain O2 sats >92% -Follow intermittent Chest X-ray & ABG as needed -Spontaneous Breathing Trials / Trach collar trials when respiratory parameters met  -Needs Vent at night -Implement VAP Bundle -Prn Bronchodilators -Brovana & Budesonide nebs, Chest PT, and  Bronchial hygiene -Continue Cefepime & Vancomycin  Sepsis present on admission secondary to MRSA & Pseudomonas Pneumonia (Healthcare associated) -Monitor fever curve -Trend WBC's & Procalcitonin -Follow cultures as above  -Continue empiric Cefepime and Vancomycin pending sensitivities  Chronic HFrEF without acute Exacerbation PMHx of Hypertension -Continuous cardiac monitoring -Maintain MAP >65 -Echocardiogram on 02/16/2021 with LVEF 60 to 65%, indeterminate diastolic parameters, normal right ventricular systolic function -Continue outpatient Lasix, Metoprolol  Acute Metabolic Encephalopathy due to Hypercapnia~resolved PMHx of Depression -Treat Hypercapnia with ventilator -Provide supportive care -Promote normal sleep/wake cycle -Avoid sedating meds as able -UDS + for Cannabinoid  -Continue home Zoloft  Hyperglycemia -CBG's q4h; Target range of 140 to 180 -SSI -Follow ICU Hypo/Hyperglycemia protocol   Best Practice (right click and "Reselect all SmartList Selections" daily)   Diet/type: NPO, SLP evaluation pending DVT prophylaxis: LMWH GI prophylaxis: H2B Lines: N/A Foley:  N/A Code Status:  full code Last date of multidisciplinary goals of care discussion [06/22/2021]  Critical care time: 35 minutes   Darel Hong, AGACNP-BC [place] [facility] Prefer epic messenger for cross cover needs If after hours, please call E-link

## 2021-06-22 NOTE — Consult Note (Signed)
PHARMACY CONSULT NOTE - FOLLOW UP  Pharmacy Consult for Electrolyte Monitoring and Replacement   Recent Labs: Potassium (mmol/L)  Date Value  06/22/2021 4.0  05/04/2014 4.3   Magnesium (mg/dL)  Date Value  06/21/2021 2.0   Calcium (mg/dL)  Date Value  06/22/2021 8.6 (L)   Calcium, Total (mg/dL)  Date Value  05/04/2014 9.1   Albumin (g/dL)  Date Value  06/19/2021 4.0   Phosphorus (mg/dL)  Date Value  06/21/2021 3.3   Sodium (mmol/L)  Date Value  06/22/2021 137  05/04/2014 140     Assessment: Patient admitted with altered mental status and shortness of breath. Patient with chronic tracheostomy, requiring ventilator support. Pharmacy was consulted for electrolyte management.  Goal of Therapy:  Electrolytes WNL  Plan:  --No supplementation indicated at this time --Continue to follow along  Tawnya Crook, PharmD, BCPS Clinical Pharmacist 06/22/2021 11:40 AM

## 2021-06-22 NOTE — Progress Notes (Addendum)
Initial Nutrition Assessment  DOCUMENTATION CODES:   Morbid obesity  INTERVENTION:   Ensure Max protein supplement BID, each supplement provides 150kcal and 30g of protein.  MVI po daily   Vitamin C 250mg  po BID  NUTRITION DIAGNOSIS:   Increased nutrient needs related to wound healing as evidenced by estimated needs.  GOAL:   Patient will meet greater than or equal to 90% of their needs  MONITOR:   Supplement acceptance, PO intake, Labs, Weight trends, Skin, I & O's  REASON FOR ASSESSMENT:   Low Braden    ASSESSMENT:   60 y/o male with a h/o HTN, HLD, OSA, CHF, MDD, GERD, cerebral aneurysm and pulmonary HTN who is admitted with respiratory failure secondary to ventilator non-compliance.  RD working remotely.  Pt is well known to this RD from numerous previous admits. Pt with good appetite and oral intake at baseline. Pt with chronic trach and uses PMSV well. Pt currently on trach collar. Pt seen by SLP and placed on a regular diet. RD will add supplements and vitamins to help pt meet his estimated needs and to support wound healing. RD will obtain history and exam at follow up.   Per chart, pt with weight gain pta.   Medications reviewed and include: aspirin, lovenox, pepcid, lasix, cefepime, vancomycin   Labs reviewed: K 3.7 wnl, BUN 28(H), P 3.3 wnl, Mg 2.0 wnl Wbc- 13.3(H)  NUTRITION - FOCUSED PHYSICAL EXAM: Unable to perform at this time   Diet Order:   Diet Order             Diet heart healthy/carb modified Room service appropriate? Yes with Assist; Fluid consistency: Thin  Diet effective now                  EDUCATION NEEDS:   No education needs have been identified at this time  Skin:  Skin Assessment: Reviewed RN Assessment (Stage II buttocks)  Last BM:  9/23- type 6  Height:   Ht Readings from Last 1 Encounters:  06/19/21 6' (1.829 m)    Weight:   Wt Readings from Last 1 Encounters:  06/22/21 (!) 153 kg    Ideal Body Weight:   80.9 kg  BMI:  Body mass index is 45.75 kg/m.  Estimated Nutritional Needs:   Kcal:  2700-3000kcal/day  Protein:  >135g/day  Fluid:  2.5-2.8L/day  Koleen Distance MS, RD, LDN Please refer to [facility] for RD and/or RD on-call/weekend/after hours pager

## 2021-06-22 NOTE — Evaluation (Addendum)
Clinical/Bedside Swallow Evaluation Patient Details  Name: Eddie Chapman MRN: [id number] Date of Birth: 1961/08/07  Today's Date: 06/22/2021 Time: SLP Start Time (ACUTE ONLY): 6283 SLP Stop Time (ACUTE ONLY): 1300 SLP Time Calculation (min) (ACUTE ONLY): 45 min  Past Medical History:  Past Medical History:  Diagnosis Date   Adenomatous colon polyp    Collapsed lung 08/2020   left   Depression    Ear drum perforation    left x2    GERD (gastroesophageal reflux disease)    Hyperlipidemia    Hypertension    Pneumonia    T8 vertebral fracture [facility])    Past Surgical History:  Past Surgical History:  Procedure Laterality Date   CARPAL TUNNEL RELEASE     left hand   COLONOSCOPY  09/18/2009, 09/15/2014   COLONOSCOPY WITH PROPOFOL N/A 12/29/2017   Procedure: COLONOSCOPY WITH PROPOFOL;  Surgeon: Manya Silvas, MD;  Location: [facility] ENDOSCOPY;  Service: Endoscopy;  Laterality: N/A;   EYE SURGERY     FRACTURE SURGERY     HERNIA REPAIR     LIPOMA EXCISION     SPINE SURGERY     TRACHEOSTOMY TUBE PLACEMENT N/A 03/07/2021   Procedure: TRACHEOSTOMY;  Surgeon: Margaretha Sheffield, MD;  Location: [facility] ORS;  Service: ENT;  Laterality: N/A;   HPI:  This is a 60 yo male with complex comorbid history including systolic CHF chronically, pulmonary hypertension, obstructive sleep apnea, Morbid Obesity hypoventilation syndrome, chronic tracheostomy, and COPD.  Pt has had multiple hospitalizations w/ recent admit to [facility] then unsuccessful discharge home d/t pt/family reportedly Not knowing how to manage the home vent and pt's tracheostomy care.  Pt is noncompliant with home vent per chart.  Admitting Dxs: Acute on chronic respiratory failure,  Noncompliant with home vent  Mucous plugging.  Pt admitted to ICU and placed on full vent support initially for further workup and treatment of acute metabolic encephalopathy and acute on chronic hypoxic & hypercapnic respiratory failure in setting of suspected healthcare  associated pneumonia along with noncompliance with home trilogy, requiring mechanical ventilation via chronic tracheostomy.  CXR: Improved aeration in the left upper lung may represent  re-expansion of the left upper lobe following bronchoscopy or  suction through the tracheostomy.  2. Persistent volume loss in the left mid and lower lung with right  to left shift of the cardiac and mediastinal structures indicating  residual atelectasis/collapse.  3. Loculated pleural fluid versus pleural thickening along the  lateral aspect of the right lung.  4. Aortic atherosclerotic calcifications.  5. Chronic bronchial wall  thickening and pulmonary vascular congestion.    Assessment / Plan / Recommendation  Clinical Impression  Pt seen for both BSE assessment and PMV tx during this session today in order to educate on wear of the PMV and on general aspiration precautions. Pt has been seen multiple times by this service for both swallowing and PMV; pt has a Chronic tracheostomy.   He has tolerated PMV placement and wear adequately w/ no discomfort or O2 desat since this AM per NSG/RT reports; and pt report. Similar presentation noted at eval yesterday and as during previous admits. Pt consumed po trials w/ no overt s/s of aspiration noted w/ any consistency; he required min verbal cues for follow through w/ general aspiration precautions. Again, similar presentation as during previous admits.  Recommend Mech Soft diet for conservation of energy d/t quick WOB/SOB w/ any exertion; w/ thin liquids. MUST wear PMV for all oral intake. MUST NOT wear  PMV when sleeping.  Precautions posted re: PMV use/wear and aspiration precautions in pt's room. NSG and pt/family updated.       PMV tx:   Pt's wear/use of Passy-muir valve(PMV) appears appropriate and at/close to his baseline from previous admit. Pt's trach is a Shiley #6, XLT, Cuffed - pt is on nocturnal vent support and will continue w/ such per MD and will require a Cuffed  trach. He has tolerated trach collar O2 support at 50% FiO2 per chart. Tracheal secretions minimal w/ PRN suctioning. Pt resting in bed. Respiratory status appears comfortable while wearing the PMV baseline - RR 19-24, O2 sats 95-97%, HR 95-98. Pt able to verbalize appropriately w/ strong vocal quality, less gravely than yesterday(but close to his baseline). Adequate breath support noted for short sentences and when pt is calm. Pt verbalized his wants/needs; noted easily distracted as at prior admit.       BSE: Pt appears to present w/ adequate oropharyngeal phase swallow function w/ No oropharyngeal phase dysphagia noted, no neuromuscular deficits noted w/ the trials given. He consumed consistencies at meal w/ No overt, clinical s/s of aspiration during po trials. No decline in O2 sats or other ANS. Pt appears at reduced risk for aspiration following general aspiration precautions and wearing his PMV during oral intake. Pt consumed the trials w/ no overt coughing, decline in vocal quality, or change in respiratory presentation during/post trials from his Baseline. Oral phase appeared [facility] w/ timely bolus management, mastication, and control of bolus propulsion for A-P transfer for swallowing. Oral clearing achieved. No unilateral oral weakness noted. Speech Clear though w/ min decreased breath support during trials if not taking Rest Breaks. Pt fed self w/ setup support.  Pt appears at his Baseline w/ wear and use of PMV and toleration of oral diet. NSG to reconsult if new needs arise during this admit. SLP Visit Diagnosis: Dysphagia, unspecified (R13.10);Aphonia (R49.1) (tracheostomy -- wears PMV)    Aspiration Risk  Mild aspiration risk (reduced following general aspiration precautions and wearing PMV)    Diet Recommendation   Mech Soft/Regular consistency diet -- meats/solids well-Cut and moistened for ease of eating and for conservation of energy d/t declined Pulmonary status and WOB/SOB w/ any  exertion. Thin liquids. Rest Breaks during meals to lessen WOB/SOB. General aspiration and REFLUX precautions. Positioning at meals -- Out of Bed is most beneficial for eating meals.  MUST HAVE PMV PLACED FOR ALL/ANY ORAL INTAKE!  Medication Administration: Whole meds with puree (for safer swallowing)    Other  Recommendations Recommended Consults:  (Dietician f/u for support/guidance) Oral Care Recommendations: Oral care BID;Oral care before and after PO;Patient independent with oral care Other Recommendations:  (n/a)    Recommendations for follow up therapy are one component of a multi-disciplinary discharge planning process, led by the attending physician.  Recommendations may be updated based on patient status, additional functional criteria and insurance authorization.  Follow up Recommendations None      Frequency and Duration  (n/a)   (n/a)       Prognosis Prognosis for Safe Diet Advancement: Fair Barriers to Reach Goals: Time post onset;Severity of deficits;Behavior      Swallow Study   General Date of Onset: 06/19/21 HPI: This is a 61 yo male with complex comorbid history including systolic CHF chronically, pulmonary hypertension, obstructive sleep apnea, Morbid Obesity hypoventilation syndrome, chronic tracheostomy, and COPD.  Pt has had multiple hospitalizations w/ recent admit to [facility] then unsuccessful discharge home d/t pt/family reportedly Not  knowing how to manage the home vent and pt's tracheostomy care.  Pt is noncompliant with home vent per chart.  Admitting Dxs: Acute on chronic respiratory failure,  Noncompliant with home vent  Mucous plugging.  Pt admitted to ICU and placed on full vent support initially for further workup and treatment of acute metabolic encephalopathy and acute on chronic hypoxic & hypercapnic respiratory failure in setting of suspected healthcare associated pneumonia along with noncompliance with home trilogy, requiring mechanical ventilation via  chronic tracheostomy.  CXR: Improved aeration in the left upper lung may represent  re-expansion of the left upper lobe following bronchoscopy or  suction through the tracheostomy.  2. Persistent volume loss in the left mid and lower lung with right  to left shift of the cardiac and mediastinal structures indicating  residual atelectasis/collapse.  3. Loculated pleural fluid versus pleural thickening along the  lateral aspect of the right lung.  4. Aortic atherosclerotic calcifications.  5. Chronic bronchial wall  thickening and pulmonary vascular congestion. Type of Study: Bedside Swallow Evaluation Previous Swallow Assessment: multiple -- see chart notes Diet Prior to this Study: Dysphagia 3 (soft);Thin liquids Temperature Spikes Noted: No (WBC 10.3) Respiratory Status: Trach Collar (50% fio2) History of Recent Intubation: No (tracheostomy) Behavior/Cognition: Alert;Cooperative;Pleasant mood;Distractible;Requires cueing (old TBI) Oral Cavity Assessment: Within Functional Limits Oral Care Completed by SLP: Recent completion by staff Oral Cavity - Dentition: Adequate natural dentition Vision: Functional for self-feeding Self-Feeding Abilities: Able to feed self;Needs set up;Needs assist Patient Positioning: Upright in bed (needed positioning support) Baseline Vocal Quality: Normal (min gravely - baseline) Volitional Cough: Strong Volitional Swallow: Able to elicit    Oral/Motor/Sensory Function Overall Oral Motor/Sensory Function: Within functional limits   Ice Chips Ice chips: Not tested   Thin Liquid Thin Liquid: Within functional limits Presentation: Self Fed;Straw (~4 ozs)    Nectar Thick Nectar Thick Liquid: Not tested   Honey Thick Honey Thick Liquid: Not tested   Puree Puree: Not tested   Solid     Solid: Within functional limits (mech soft) Presentation: Self Fed;Spoon (8+ trials)        Orinda Kenner, MS, CCC-SLP Speech Language Pathologist Rehab  Services [phone] Jamarl Pew 06/22/2021,4:56 PM

## 2021-06-22 NOTE — Progress Notes (Signed)
[facility] Progress Note Patient Name: Eddie Chapman DOB: 1961/02/23 MRN: [id number]   Date of Service  06/22/2021  HPI/Events of Note  Hypertension - BP = 152/103 with MAP = 117. HR = 107.  eICU Interventions  Plan: Hydralazine 10 mg IV Q 4 hours PRN SBP > 160 or DBP > 100.     Intervention Category Major Interventions: Hypertension - evaluation and management  Lysle Dingwall 06/22/2021, 6:31 AM

## 2021-06-23 DIAGNOSIS — J9621 Acute and chronic respiratory failure with hypoxia: Secondary | ICD-10-CM | POA: Diagnosis not present

## 2021-06-23 DIAGNOSIS — J9622 Acute and chronic respiratory failure with hypercapnia: Secondary | ICD-10-CM | POA: Diagnosis not present

## 2021-06-23 DIAGNOSIS — J189 Pneumonia, unspecified organism: Secondary | ICD-10-CM | POA: Diagnosis not present

## 2021-06-23 LAB — BASIC METABOLIC PANEL
Anion gap: 7 (ref 5–15)
BUN: 17 mg/dL (ref 8–23)
CO2: 35 mmol/L — ABNORMAL HIGH (ref 22–32)
Calcium: 8.7 mg/dL — ABNORMAL LOW (ref 8.9–10.3)
Chloride: 96 mmol/L — ABNORMAL LOW (ref 98–111)
Creatinine, Ser: 0.72 mg/dL (ref 0.61–1.24)
GFR, Estimated: >60 mL/min (ref >60)
Glucose, Bld: 116 mg/dL — ABNORMAL HIGH (ref 70–99)
Potassium: 4.1 mmol/L (ref 3.5–5.1)
Sodium: 138 mmol/L (ref 135–145)

## 2021-06-23 LAB — CBC
HCT: 36.4 % — ABNORMAL LOW (ref 39.0–52.0)
Hemoglobin: 11.9 g/dL — ABNORMAL LOW (ref 13.0–17.0)
MCH: 32 pg (ref 26.0–34.0)
MCHC: 32.7 g/dL (ref 30.0–36.0)
MCV: 97.8 fL (ref 80.0–100.0)
Platelets: 141 10*3/uL — ABNORMAL LOW (ref 150–400)
RBC: 3.72 MIL/uL — ABNORMAL LOW (ref 4.22–5.81)
RDW: 13 % (ref 11.5–15.5)
WBC: 9.4 10*3/uL (ref 4.0–10.5)
nRBC: 0 % (ref 0.0–0.2)

## 2021-06-23 LAB — CULTURE, RESPIRATORY W GRAM STAIN

## 2021-06-23 LAB — PHOSPHORUS: Phosphorus: 3.6 mg/dL (ref 2.5–4.6)

## 2021-06-23 LAB — MAGNESIUM: Magnesium: 2 mg/dL (ref 1.7–2.4)

## 2021-06-23 MED ORDER — ENOXAPARIN SODIUM 80 MG/0.8ML IJ SOSY
0.5000 mg/kg | PREFILLED_SYRINGE | Freq: Every day | INTRAMUSCULAR | Status: DC
  Administered 2021-06-23 – 2021-06-27 (×5): 77.5 mg via SUBCUTANEOUS
  Filled 2021-06-23 (×5): qty 0.8

## 2021-06-23 NOTE — Consult Note (Signed)
PHARMACY CONSULT NOTE - FOLLOW UP  Pharmacy Consult for Electrolyte Monitoring and Replacement   Recent Labs: Potassium (mmol/L)  Date Value  06/23/2021 4.1  05/04/2014 4.3   Magnesium (mg/dL)  Date Value  06/23/2021 2.0   Calcium (mg/dL)  Date Value  06/23/2021 8.7 (L)   Calcium, Total (mg/dL)  Date Value  05/04/2014 9.1   Albumin (g/dL)  Date Value  06/19/2021 4.0   Phosphorus (mg/dL)  Date Value  06/23/2021 3.6   Sodium (mmol/L)  Date Value  06/23/2021 138  05/04/2014 140     Assessment: Patient admitted with altered mental status and shortness of breath. Patient with chronic tracheostomy, requiring ventilator support. Pharmacy was consulted for electrolyte management.  Goal of Therapy:  Electrolytes WNL  Plan:  --No supplementation indicated at this time --Continue to follow along  Lu Duffel, PharmD, BCPS Clinical Pharmacist 06/23/2021 10:15 AM

## 2021-06-23 NOTE — Progress Notes (Signed)
Pt on 50% 50 L. PMV ON.   Pt eating adequately. Pt voiding multiple times throughout day, multiple occurrences leaked onto pad.   Entire linen change completed.   Small BM noted on pad.   Girlfriend at bedside.

## 2021-06-23 NOTE — Progress Notes (Signed)
NAME:  Eddie Chapman, MRN:  [id number], DOB:  Sep 11, 1961, LOS: 4 ADMISSION DATE:  06/19/2021, CONSULTATION DATE:  06/19/2021 REFERRING MD:  [name], CHIEF COMPLAINT:  Altered Mental Status, shortness of breath   Brief Pt Description / Synopsis:  60 year old male admitted with acute metabolic encephalopathy and acute on chronic hypoxic & hypercapnic respiratory failure in setting of suspected healthcare associated pneumonia along with noncompliance with home trilogy, requiring mechanical ventilation via chronic tracheostomy.  History of Present Illness:  60 year old male with a past medical history significant for acute on chronic hypoxic and hypercapnic respiratory failure with trach dependence, HFrEF, obstructive sleep apnea, obesity hypoventilation syndrome, pulmonary hypertension, and previous noncompliance with home Trilogy who presented to [facility] ED on 06/19/2021 due to complaints of shortness of breath and confusion.  Patient is currently altered/lethargic and no family is currently available, therefore history is obtained from ED nursing notes.  Per notes it is reported he has become increasingly confused and lethargic over the past 24 hours.  This morning he exhibited increased work of breathing along with the confusion of which EMS was dispatched.  EMS found him hypoxic with O2 saturations in the 70's.  It is reported that he forgot to use him home Trilogy last night.    Of note he has had multiple recent hospitalizations, with his most recent admission at [facility] from 05/10/21 throughout 05/17/21 for acute on chronic hypoxic & Hypercapnic Respiratory Failure.   He followed up with Pulmonologist Dr. [name] on 06/15/21, where he was started on Azithromycin for Tracheitis vs Pneumonia.  Given his work of breathing and copious thick secretions from his Lurline Idol, he was placed on his Trilogy vent. He met SIRS Criteria, and given concern for possible pneumonia, sepsis workup was initiated and he was  given broad spectrum antibiotics including IV Cefepime and Vancomycin.  He also received 125 mg IV Solumedrol and bronchodilators.  PCCM is asked to admit the patient to ICU for further workup and treatment of acute metabolic encephalopathy and acute on chronic hypoxic & hypercapnic respiratory failure in setting of suspected healthcare associated pneumonia along with noncompliance with home trilogy, requiring mechanical ventilation via chronic tracheostomy.  Pertinent  Medical History  Acute on chronic hypoxic hypercapnic respiratory failure Tracheostomy dependence Obstructive sleep apnea Obesity hypoventilation syndrome Pulmonary Hypertension Hypertension  Hyperlipidemia HFrEF Pneumonia Depression GERD T8 Vertebral Fracture  Micro Data:  06/19/2021: SARS-CoV-2 and influenza PCR>> negative 06/19/2021: Blood culture>> no growth to date 06/19/2021: Urine>>no growth 06/19/2021: Sputum>>MRSA (resistance to Ciprofloxacin, Erythromycin, Oxacillin) ,RARE Pseudomonas  06/19/2021: Strep pneumo urinary antigen>>negative 06/19/2021: Legionella urinary antigen>>negative  Antimicrobials:  Cefepime 9/20>> Vancomycin 9/20>>  Significant Hospital Events: Including procedures, antibiotic start and stop dates in addition to other pertinent events   06/19/2021: Presented to ED due to shortness of breath and altered mental status.  Found to be hypercapnic requiring mechanical ventilation via chronic tracheostomy.  PCCM asked to admit 06/21/2021: Attempting TC trials, Speech evaluation, Sputum culture with Staph Aureus and rare gram negative rods 06/22/21: Tolerating TC during day, vent at night. 06/23/2021: Continues to tolerate trach collar during the day, using vent at nighttime consistently  Interim History / Subjective:  -No acute events reported overnight -Afebrile, hemodynamically stable -Tolerating TC during day, vent at night -Tracheal aspirate with MRSA and Pseudomonas ~ on Vancomycin &  Cefepime -Tracheal aspirate organisms however are described as "rare" query colonization -Chest x-ray most consistent with atelectasis -WBC improved to 10.3 (13.3) -Renal function stable with resumption of home Lasix 9/22  Objective   Blood pressure 95/87, pulse (!) 113, temperature 99.4 F (37.4 C), temperature source Oral, resp. rate 15, height 6' (1.829 m), weight (!) 153 kg, SpO2 94 %.    Vent Mode: PRVC FiO2 (%):  [50 %-55 %] 55 % Set Rate:  [12 bmp] 12 bmp Vt Set:  [500 mL] 500 mL PEEP:  [5 cmH20] 5 cmH20   Intake/Output Summary (Last 24 hours) at 06/23/2021 1131 Last data filed at 06/23/2021 0400 Gross per 24 hour  Intake 200 ml  Output 2100 ml  Net -1900 ml    Filed Weights   06/21/21 0500 06/22/21 0500 06/23/21 0500  Weight: (!) 153 kg (!) 153 kg (!) 153 kg    Examination: Gen:      No acute distress, acute on chronic ill-appearing gentleman, on Trach collar, using Passy-Muir valve to speak HEENT:  EOMI, sclera anicteric, atraumatic, normocephalic Neck:     No masses; no thyromegaly, tracheostomy midline Lungs:   Clear breath sounds bilaterally, no rales or wheezing, even, nonlabored CV:         Tachycardia, Regular rhythm; no murmurs/rubs/gallops Abd:      Protuberant/obese, no distention, soft, no tenderness cannot appreciate masses due to obesity Ext:    No edema; adequate peripheral perfusion Skin:      Warm and dry; no rash Neuro: Awake and alert, follow commands and moves all extremities, no focal deficits, speaking with Passy-Muir valve, pupils PERRLA  Pressure Injury 06/19/21 Buttocks Right Stage 2 -  Partial thickness loss of dermis presenting as a shallow open injury with a red, pink wound bed without slough. (Active)  06/19/21 1854  Location: Buttocks  Location Orientation: Right  Staging: Stage 2 -  Partial thickness loss of dermis presenting as a shallow open injury with a red, pink wound bed without slough.  Wound Description (Comments):   Present on  Admission: Yes     Resolved Hospital Problem list     Assessment & Plan:   Acute on Chronic Hypoxic & Hypercapnic Respiratory Failure due to MRSA & Pseudomonas Pneumonia versus left lung atelectasis, & noncompliance with home Trilogy PMHx of Tracheostomy dependence, Obstructive Sleep Apnea, Obesity Hypoventilation syndrome, Pulmonary Hypertension -Full vent support nocturnally, implement lung protective strategies -Plateau pressures less than 30 cm H20 -Wean FiO2 & PEEP as tolerated to maintain O2 sats >92% -Follow intermittent Chest X-ray & ABG as needed -Continue trach collar during today, ventilation nocturnally or when asleep -Needs Vent at night and with naps -On VAP Bundle -PRN Bronchodilators -Brovana ,Budesonide and Yupelri nebs, Chest PT, and Bronchial hygiene -Continue Cefepime & Vancomycin  Sepsis present on admission secondary to MRSA & Pseudomonas Pneumonia (Healthcare associated) -Monitor fever curve -Trend WBC's & Procalcitonin -Follow cultures as above  -Continue empiric Cefepime and Vancomycin pending sensitivities  Chronic HFrEF without acute Exacerbation PMHx of Hypertension -Continuous cardiac monitoring -Maintain MAP >65 -Echocardiogram on 02/16/2021 with LVEF 60 to 65%, indeterminate diastolic parameters, normal right ventricular systolic function -Continue outpatient Lasix, Metoprolol  Acute Metabolic Encephalopathy due to Hypercapnia~resolved PMHx of Depression -Treat Hypercapnia with ventilator -Provide supportive care -Promote normal sleep/wake cycle -Avoid sedating meds as able -UDS + for Cannabinoid  -Continue home Zoloft  Hyperglycemia -CBG's q4h; Target range of 140 to 180 -SSI -Follow ICU Hypo/Hyperglycemia protocol   Best Practice (right click and "Reselect all SmartList Selections" daily)   Diet/type: NPO, SLP evaluation pending DVT prophylaxis: LMWH GI prophylaxis: H2B Lines: N/A Foley:  N/A Code Status:  full code Last date  of multidisciplinary goals of care discussion [06/22/2021], Palliative Care consult placed for realistic goals of care discussion.  Critical care time: 35 minutes   C. Derrill Kay, MD Advanced Bronchoscopy PCCM [facility]    *This note was dictated using voice recognition software/Dragon.  Despite best efforts to proofread, errors can occur which can change the meaning.  Any change was purely unintentional.

## 2021-06-24 DIAGNOSIS — J9621 Acute and chronic respiratory failure with hypoxia: Secondary | ICD-10-CM | POA: Diagnosis not present

## 2021-06-24 DIAGNOSIS — J189 Pneumonia, unspecified organism: Secondary | ICD-10-CM | POA: Diagnosis not present

## 2021-06-24 DIAGNOSIS — J9622 Acute and chronic respiratory failure with hypercapnia: Secondary | ICD-10-CM | POA: Diagnosis not present

## 2021-06-24 LAB — CULTURE, BLOOD (ROUTINE X 2)
Culture: NO GROWTH
Culture: NO GROWTH
Special Requests: ADEQUATE

## 2021-06-24 LAB — BASIC METABOLIC PANEL
Anion gap: 9 (ref 5–15)
BUN: 20 mg/dL (ref 8–23)
CO2: 36 mmol/L — ABNORMAL HIGH (ref 22–32)
Calcium: 9.1 mg/dL (ref 8.9–10.3)
Chloride: 94 mmol/L — ABNORMAL LOW (ref 98–111)
Creatinine, Ser: 0.74 mg/dL (ref 0.61–1.24)
GFR, Estimated: >60 mL/min (ref >60)
Glucose, Bld: 120 mg/dL — ABNORMAL HIGH (ref 70–99)
Potassium: 4.2 mmol/L (ref 3.5–5.1)
Sodium: 139 mmol/L (ref 135–145)

## 2021-06-24 LAB — CBC
HCT: 37 % — ABNORMAL LOW (ref 39.0–52.0)
Hemoglobin: 11.9 g/dL — ABNORMAL LOW (ref 13.0–17.0)
MCH: 31.2 pg (ref 26.0–34.0)
MCHC: 32.2 g/dL (ref 30.0–36.0)
MCV: 96.9 fL (ref 80.0–100.0)
Platelets: 143 10*3/uL — ABNORMAL LOW (ref 150–400)
RBC: 3.82 MIL/uL — ABNORMAL LOW (ref 4.22–5.81)
RDW: 12.9 % (ref 11.5–15.5)
WBC: 7.6 10*3/uL (ref 4.0–10.5)
nRBC: 0 % (ref 0.0–0.2)

## 2021-06-24 LAB — PHOSPHORUS: Phosphorus: 3.5 mg/dL (ref 2.5–4.6)

## 2021-06-24 LAB — MAGNESIUM: Magnesium: 2.1 mg/dL (ref 1.7–2.4)

## 2021-06-24 MED ORDER — MUPIROCIN 2 % EX OINT
TOPICAL_OINTMENT | Freq: Two times a day (BID) | CUTANEOUS | Status: DC
  Administered 2021-06-27: 1 via NASAL
  Filled 2021-06-24: qty 22

## 2021-06-24 MED ORDER — INFLUENZA VAC SPLIT QUAD 0.5 ML IM SUSY
0.5000 mL | PREFILLED_SYRINGE | INTRAMUSCULAR | Status: AC
  Administered 2021-06-25: 0.5 mL via INTRAMUSCULAR
  Filled 2021-06-24 (×2): qty 0.5

## 2021-06-24 NOTE — Progress Notes (Signed)
NAME:  Eddie Chapman, MRN:  [id number], DOB:  March 06, 1961, LOS: 5 ADMISSION DATE:  06/19/2021, CONSULTATION DATE:  06/19/2021 REFERRING MD:  [name], CHIEF COMPLAINT:  Altered Mental Status, shortness of breath   Brief Pt Description / Synopsis:  60 year old male admitted with acute metabolic encephalopathy and acute on chronic hypoxic & hypercapnic respiratory failure in setting of suspected healthcare associated pneumonia along with noncompliance with home trilogy, requiring mechanical ventilation via chronic tracheostomy.  History of Present Illness:  60 year old male with a past medical history significant for acute on chronic hypoxic and hypercapnic respiratory failure with trach dependence, HFrEF, obstructive sleep apnea, obesity hypoventilation syndrome, pulmonary hypertension, and previous noncompliance with home Trilogy who presented to [facility] ED on 06/19/2021 due to complaints of shortness of breath and confusion.  Patient is currently altered/lethargic and no family is currently available, therefore history is obtained from ED nursing notes.  Per notes it is reported he has become increasingly confused and lethargic over the past 24 hours.  This morning he exhibited increased work of breathing along with the confusion of which EMS was dispatched.  EMS found him hypoxic with O2 saturations in the 70's.  It is reported that he forgot to use him home Trilogy last night.    Of note he has had multiple recent hospitalizations, with his most recent admission at [facility] from 05/10/21 throughout 05/17/21 for acute on chronic hypoxic & Hypercapnic Respiratory Failure.   He followed up with Pulmonologist Dr. [name] on 06/15/21, where he was started on Azithromycin for Tracheitis vs Pneumonia.  Given his work of breathing and copious thick secretions from his Lurline Idol, he was placed on his Trilogy vent. He met SIRS Criteria, and given concern for possible pneumonia, sepsis workup was initiated and he was  given broad spectrum antibiotics including IV Cefepime and Vancomycin.  He also received 125 mg IV Solumedrol and bronchodilators.  PCCM is asked to admit the patient to ICU for further workup and treatment of acute metabolic encephalopathy and acute on chronic hypoxic & hypercapnic respiratory failure in setting of suspected healthcare associated pneumonia along with noncompliance with home trilogy, requiring mechanical ventilation via chronic tracheostomy.  Pertinent  Medical History  Acute on chronic hypoxic hypercapnic respiratory failure Tracheostomy dependence Obstructive sleep apnea Obesity hypoventilation syndrome Pulmonary Hypertension Hypertension  Hyperlipidemia HFrEF Pneumonia Depression GERD T8 Vertebral Fracture  Micro Data:  06/19/2021: SARS-CoV-2 and influenza PCR>> negative 06/19/2021: Blood culture>> no growth to date 06/19/2021: Urine>>no growth 06/19/2021: Sputum>>MRSA (resistance to Ciprofloxacin, Erythromycin, Oxacillin) ,RARE Pseudomonas  06/19/2021: Strep pneumo urinary antigen>>negative 06/19/2021: Legionella urinary antigen>>negative  Antimicrobials:  Cefepime 9/20>> Vancomycin 9/20>>  Significant Hospital Events: Including procedures, antibiotic start and stop dates in addition to other pertinent events   06/19/2021: Presented to ED due to shortness of breath and altered mental status.  Found to be hypercapnic requiring mechanical ventilation via chronic tracheostomy.  PCCM asked to admit 06/21/2021: Attempting TC trials, Speech evaluation, Sputum culture with Staph Aureus and rare gram negative rods 06/22/21: Tolerating TC during day, vent at night. 06/23/2021: Continues to tolerate trach collar during the day, using vent at nighttime consistently 06/24/2021: Uneventful night.  On vent at night.  Mentating well during the day  Interim History / Subjective:  -No acute events reported overnight -Afebrile, hemodynamically stable -Tolerating TC during day, vent  at night -Tracheal aspirate with MRSA and Pseudomonas ~ on Vancomycin & Cefepime -Tracheal aspirate organisms however are described as "rare" query colonization -Chest x-ray most consistent with atelectasis -  WBC improved to 7.6 -Renal function stable with resumption of home Lasix 9/22  Objective   Blood pressure (!) 144/84, pulse 93, temperature 98.6 F (37 C), temperature source Oral, resp. rate (!) 21, height 6' (1.829 m), weight (!) 153 kg, SpO2 92 %.    Vent Mode: PRVC FiO2 (%):  [50 %-55 %] 55 % Set Rate:  [12 bmp] 12 bmp Vt Set:  [550 mL] 550 mL PEEP:  [5 cmH20] 5 cmH20   Intake/Output Summary (Last 24 hours) at 06/24/2021 1420 Last data filed at 06/24/2021 1400 Gross per 24 hour  Intake 1373.6 ml  Output 1550 ml  Net -176.4 ml    Filed Weights   06/21/21 0500 06/22/21 0500 06/23/21 0500  Weight: (!) 153 kg (!) 153 kg (!) 153 kg    Examination: Gen:      No acute distress, acute on chronic ill-appearing gentleman, on Trach collar, using Passy-Muir valve to speak HEENT:  EOMI, sclera anicteric, atraumatic, normocephalic Neck:     No masses; no thyromegaly, tracheostomy midline Lungs:   Clear breath sounds bilaterally, no rales or wheezing, even, nonlabored CV:         Tachycardia, Regular rhythm; no murmurs/rubs/gallops Abd:      Protuberant/obese, no distention, soft, no tenderness cannot appreciate masses due to obesity Ext:    No edema; adequate peripheral perfusion Skin:      Warm and dry; no rash Neuro: Awake and alert, follow commands and moves all extremities, no focal deficits, speaking with Passy-Muir valve, pupils PERRLA  Pressure Injury 06/19/21 Buttocks Right Stage 2 -  Partial thickness loss of dermis presenting as a shallow open injury with a red, pink wound bed without slough. (Active)  06/19/21 1854  Location: Buttocks  Location Orientation: Right  Staging: Stage 2 -  Partial thickness loss of dermis presenting as a shallow open injury with a red, pink  wound bed without slough.  Wound Description (Comments):   Present on Admission: Yes   Scheduled Meds:  arformoterol  15 mcg Nebulization BID   vitamin C  250 mg Oral BID   aspirin EC  81 mg Oral Daily   budesonide (PULMICORT) nebulizer solution  0.5 mg Nebulization BID   chlorhexidine gluconate (MEDLINE KIT)  15 mL Mouth Rinse BID   Chlorhexidine Gluconate Cloth  6 each Topical Q0600   cholestyramine  4 g Oral BID   enoxaparin (LOVENOX) injection  0.5 mg/kg Subcutaneous QHS   famotidine (PEPCID) IV  20 mg Intravenous Q12H   fluticasone  1 spray Each Nare Daily   furosemide  40 mg Oral Daily   lidocaine  1 patch Transdermal Q24H   mouth rinse  15 mL Mouth Rinse 10 times per day   metoprolol tartrate  25 mg Oral Daily   multivitamin with minerals  1 tablet Oral Daily   Ensure Max Protein  11 oz Oral BID   revefenacin  175 mcg Nebulization Daily   sertraline  25 mg Oral Daily   Continuous Infusions:  ceFEPime (MAXIPIME) IV 200 mL/hr at 06/24/21 1400   vancomycin Stopped (06/24/21 1154)   PRN Meds:.acetaminophen, albuterol, ALPRAZolam, docusate sodium, hydrALAZINE, loperamide, ondansetron (ZOFRAN) IV, oxyCODONE, polyethylene glycol   Resolved Hospital Problem list     Assessment & Plan:   Acute on Chronic Hypoxic & Hypercapnic Respiratory Failure due to MRSA & Pseudomonas Pneumonia versus left lung atelectasis, & noncompliance with home Trilogy PMHx of Tracheostomy dependence, Obstructive Sleep Apnea, Obesity Hypoventilation syndrome, Pulmonary Hypertension -Full vent  support nocturnally -Wean FiO2 & PEEP as tolerated to maintain O2 sats >92% -Follow intermittent Chest X-ray & ABG as needed -Continue trach collar during today, ventilation nocturnally or when asleep -Needs Vent at night and with naps -On VAP Bundle -PRN Bronchodilators -Brovana ,Budesonide and Yupelri nebs, Chest PT, and Bronchial hygiene -Continue Cefepime & Vancomycin -We will likely need skilled nursing  given his failure to thrive and noncompliance at home  Sepsis present on admission secondary to MRSA & Pseudomonas Pneumonia (Healthcare associated) -Organisms only "rare" suspect this may be COLONIZATION -Chest x-ray most consistent with atelectasis and has improved with mechanical ventilation -Monitor fever curve -Trend WBC's & Procalcitonin -Follow cultures as above  -Continue empiric Cefepime and Vancomycin pending sensitivities  Chronic HFrEF without acute Exacerbation PMHx of Hypertension -Continuous cardiac monitoring -Maintain MAP >65 -Echocardiogram on 02/16/2021 with LVEF 60 to 65%, indeterminate diastolic parameters, normal right ventricular systolic function -Continue outpatient Lasix, Metoprolol  Acute Metabolic Encephalopathy due to Hypercapnia~resolved PMHx of Depression -Treat Hypercapnia with ventilator -Provide supportive care -Promote normal sleep/wake cycle -Avoid sedating meds as able -UDS + for Cannabinoid, advised he would avoid particularly with respiratory issues -Continue home Zoloft  Hyperglycemia -CBG's q4h; Target range of 140 to 180 -SSI -Follow ICU Hypo/Hyperglycemia protocol   Best Practice (right click and "Reselect all SmartList Selections" daily)   Diet/type: NPO, SLP evaluation pending DVT prophylaxis: LMWH GI prophylaxis: H2B Lines: N/A Foley:  N/A Code Status:  full code  Last date of multidisciplinary goals of care discussion [06/22/2021], Palliative Care consult placed for realistic goals of care discussion. Placed OT PT consults for evaluation of discharge needs  Critical care time: 35 minutes   C. Derrill Kay, MD Advanced Bronchoscopy PCCM [facility]    *This note was dictated using voice recognition software/Dragon.  Despite best efforts to proofread, errors can occur which can change the meaning.  Any change was purely unintentional.

## 2021-06-24 NOTE — TOC Progression Note (Signed)
Transition of Care [facility]) - Progression Note    Patient Details  Name: Eddie Chapman MRN: [id number] Date of Birth: 1961/07/09  [facility] CM/SW [place], [place] Phone Number: [phone] 06/24/2021, 1:01 PM  Clinical Narrative:     Swallow evaluation on 06/23/2021.  Recommendation for mech soft/regular consistency diet.  Patient mild aspiration risk.  Patient has hx of non-compliance on vent, at home.  Patient's SO Baum,[name] [phone] (Mobile), was trained on vent usage during last two hospital admits. Patient refuses to use vent at home. PT/OT consults requested for possible CIR or SNF placement. Patient has refused placement in the past.  Expected Discharge Plan: [place] Barriers to Discharge: Continued Medical Work up  Expected Discharge Plan and Services Expected Discharge Plan: [place] In-house Referral: Clinical Social Work                       DME Arranged: Ventilator DME Agency: [employer] Date DME Agency Contacted: 06/21/21 Time DME Agency Contacted: 0932               Social Determinants of Health (SDOH) Interventions    Readmission Risk Interventions Readmission Risk Prevention Plan 06/21/2021  Transportation Screening Complete  PCP or Specialist Appt within 3-5 Days Complete  HRI or [place] Complete  Social Work Consult for [place] Planning/Counseling Complete  Palliative Care Screening Not Applicable  Medication Review Press photographer) Complete  Some recent data might be hidden

## 2021-06-24 NOTE — Consult Note (Signed)
PHARMACY CONSULT NOTE - FOLLOW UP  Pharmacy Consult for Electrolyte Monitoring and Replacement   Recent Labs: Potassium (mmol/L)  Date Value  06/24/2021 4.2  05/04/2014 4.3   Magnesium (mg/dL)  Date Value  06/24/2021 2.1   Calcium (mg/dL)  Date Value  06/24/2021 9.1   Calcium, Total (mg/dL)  Date Value  05/04/2014 9.1   Albumin (g/dL)  Date Value  06/19/2021 4.0   Phosphorus (mg/dL)  Date Value  06/24/2021 3.5   Sodium (mmol/L)  Date Value  06/24/2021 139  05/04/2014 140     Assessment: Patient admitted with altered mental status and shortness of breath. Patient with chronic tracheostomy, requiring ventilator support. Pharmacy was consulted for electrolyte management.  Goal of Therapy:  Electrolytes WNL  Plan:  --No supplementation indicated at this time --Continue to follow along  Lu Duffel, PharmD, BCPS Clinical Pharmacist 06/24/2021 12:31 PM

## 2021-06-25 DIAGNOSIS — J9621 Acute and chronic respiratory failure with hypoxia: Secondary | ICD-10-CM | POA: Diagnosis not present

## 2021-06-25 DIAGNOSIS — E119 Type 2 diabetes mellitus without complications: Secondary | ICD-10-CM

## 2021-06-25 DIAGNOSIS — J189 Pneumonia, unspecified organism: Secondary | ICD-10-CM | POA: Diagnosis not present

## 2021-06-25 DIAGNOSIS — J9622 Acute and chronic respiratory failure with hypercapnia: Secondary | ICD-10-CM | POA: Diagnosis not present

## 2021-06-25 LAB — BASIC METABOLIC PANEL
Anion gap: 7 (ref 5–15)
BUN: 19 mg/dL (ref 8–23)
CO2: 39 mmol/L — ABNORMAL HIGH (ref 22–32)
Calcium: 9.1 mg/dL (ref 8.9–10.3)
Chloride: 93 mmol/L — ABNORMAL LOW (ref 98–111)
Creatinine, Ser: 0.76 mg/dL (ref 0.61–1.24)
GFR, Estimated: >60 mL/min (ref >60)
Glucose, Bld: 127 mg/dL — ABNORMAL HIGH (ref 70–99)
Potassium: 4.5 mmol/L (ref 3.5–5.1)
Sodium: 139 mmol/L (ref 135–145)

## 2021-06-25 LAB — CBC
HCT: 38 % — ABNORMAL LOW (ref 39.0–52.0)
Hemoglobin: 12.6 g/dL — ABNORMAL LOW (ref 13.0–17.0)
MCH: 32.7 pg (ref 26.0–34.0)
MCHC: 33.2 g/dL (ref 30.0–36.0)
MCV: 98.7 fL (ref 80.0–100.0)
Platelets: 154 10*3/uL (ref 150–400)
RBC: 3.85 MIL/uL — ABNORMAL LOW (ref 4.22–5.81)
RDW: 12.8 % (ref 11.5–15.5)
WBC: 8.2 10*3/uL (ref 4.0–10.5)
nRBC: 0 % (ref 0.0–0.2)

## 2021-06-25 LAB — PHOSPHORUS: Phosphorus: 3.1 mg/dL (ref 2.5–4.6)

## 2021-06-25 LAB — MAGNESIUM: Magnesium: 2 mg/dL (ref 1.7–2.4)

## 2021-06-25 MED ORDER — PREDNISONE 20 MG PO TABS
20.0000 mg | ORAL_TABLET | Freq: Every day | ORAL | Status: DC
  Administered 2021-06-25 – 2021-06-28 (×4): 20 mg via ORAL
  Filled 2021-06-25 (×4): qty 1

## 2021-06-25 NOTE — Evaluation (Signed)
Physical Therapy Evaluation Patient Details Name: Eddie Chapman MRN: [id number] DOB: 04-28-1961 Today's Date: 06/25/2021  History of Present Illness  Pt is a 60 year old male with chronic tracheostomy for chronic hypoxic and hypercapnic respiratory failure who presented to the ED for acute shortness of breath.  Pt's oxygen saturations were noted to be in the 70s by EMS.  He was also noted to have altered mental status in the emergency department.  MD assessment includes: Acute on Chronic Hypoxic & Hypercapnic Respiratory Failure due to MRSA & Pseudomonas Pneumonia versus left lung atelectasis, & noncompliance with home Trilogy, sepsis, Acute Metabolic Encephalopathy due to Hypercapnia~resolved, and hyperglycemia. PMH includes COPD, obesity hypoventilation syndrome, and HFpEF.   Clinical Impression  Pt was pleasant and motivated to participate during the session and put forth good effort throughout.  Pt required extra time and effort and use of the bed rails with bed mobility tasks and was able to come to standing from an elevated EOB without physical assist. Pt was able to do minimal low amplitude marching in place at the EOB and then take several small steps to the chair without LOB or buckling and with SpO2 and HR WNL.  Pt has limited activity tolerance and functional strength and is at elevated risk for falls and furhter functional decline.  Pt will benefit from PT services in a SNF setting upon discharge to safely address deficits listed in patient problem list for decreased caregiver assistance and eventual return to PLOF.         Recommendations for follow up therapy are one component of a multi-disciplinary discharge planning process, led by the attending physician.  Recommendations may be updated based on patient status, additional functional criteria and insurance authorization.  Follow Up Recommendations SNF;Supervision/Assistance - 24 hour    Equipment Recommendations  None recommended  by PT    Recommendations for Other Services       Precautions / Restrictions Precautions Precautions: Fall Precaution Comments: HOB >/= 30 deg Restrictions Weight Bearing Restrictions: No      Mobility  Bed Mobility Overal bed mobility: Modified Independent             General bed mobility comments: Extra time and effort and use of bed rails required    Transfers Overall transfer level: Needs assistance Equipment used: Rolling walker (2 wheeled) Transfers: Sit to/from Stand Sit to Stand: Min guard;From elevated surface         General transfer comment: Fair concentric and eccentric control and stability  Ambulation/Gait Ambulation/Gait assistance: Min guard Gait Distance (Feet): 2 Feet Assistive device: Rolling walker (2 wheeled) Gait Pattern/deviations: Step-through pattern;Decreased step length - right;Decreased step length - left Gait velocity: decreased   General Gait Details: Pt able to take several very small steps from bed to chair with mod lean on the RW for support  Stairs            Wheelchair Mobility    Modified Rankin (Stroke Patients Only)       Balance Overall balance assessment: Needs assistance   Sitting balance-Leahy Scale: Good     Standing balance support: Bilateral upper extremity supported;During functional activity Standing balance-Leahy Scale: Fair                               Pertinent Vitals/Pain Pain Assessment: No/denies pain    Home Living Family/patient expects to be discharged to:: Private residence Living Arrangements: Spouse/significant other Available Help at  Discharge: Family;Available 24 hours/day Type of Home: House Home Access: Stairs to enter Entrance Stairs-Rails: None Entrance Stairs-Number of Steps: 2 Home Layout: One level Home Equipment: Walker - 2 wheels;Bedside commode;Other (comment);Grab bars - tub/shower;Grab bars - toilet;Hospital bed;Walker - 4 wheels      Prior  Function Level of Independence: Needs assistance   Gait / Transfers Assistance Needed: Mod Ind amb limited household distances with a RW, unable to navigate stairs secondary to BLE weakness, uses EMS transport for in/out of home  ADL's / Homemaking Assistance Needed: Spouse assists PRN with ADLs        Hand Dominance        Extremity/Trunk Assessment   Upper Extremity Assessment Upper Extremity Assessment: Generalized weakness    Lower Extremity Assessment Lower Extremity Assessment: Generalized weakness       Communication   Communication: Tracheostomy  Cognition Arousal/Alertness: Awake/alert Behavior During Therapy: WFL for tasks assessed/performed Overall Cognitive Status: Within Functional Limits for tasks assessed                                        General Comments      Exercises Total Joint Exercises Ankle Circles/Pumps: AROM;Strengthening;Both;10 reps Quad Sets: Strengthening;Both;10 reps Gluteal Sets: Strengthening;Both;10 reps Heel Slides: AROM;Strengthening;Both;5 reps Hip ABduction/ADduction: AROM;Strengthening;Both;5 reps Long Arc Quad: AROM;Strengthening;Both;10 reps Marching in Standing: AROM;Strengthening;Both;5 reps;Standing Other Exercises Other Exercises: HEP education for BLE APs, QS, and GS x 10 each every 1-2 hours daily   Assessment/Plan    PT Assessment Patient needs continued PT services  PT Problem List Decreased strength;Decreased activity tolerance;Decreased balance;Decreased mobility;Decreased knowledge of use of DME;Cardiopulmonary status limiting activity       PT Treatment Interventions DME instruction;Balance training;Gait training;Stair training;Therapeutic activities;Functional mobility training;Therapeutic exercise;Patient/family education    PT Goals (Current goals can be found in the Care Plan section)  Acute Rehab PT Goals Patient Stated Goal: To get stronger PT Goal Formulation: With patient Time  For Goal Achievement: 07/08/21 Potential to Achieve Goals: Fair    Frequency Min 2X/week   Barriers to discharge Inaccessible home environment      Co-evaluation               AM-PAC PT "6 Clicks" Mobility  Outcome Measure Help needed turning from your back to your side while in a flat bed without using bedrails?: A Little Help needed moving from lying on your back to sitting on the side of a flat bed without using bedrails?: A Little Help needed moving to and from a bed to a chair (including a wheelchair)?: A Little Help needed standing up from a chair using your arms (e.g., wheelchair or bedside chair)?: A Little Help needed to walk in hospital room?: A Lot Help needed climbing 3-5 steps with a railing? : Total 6 Click Score: 15    End of Session Equipment Utilized During Treatment: Gait belt;Oxygen Activity Tolerance: Patient tolerated treatment well Patient left: in chair;with call bell/phone within reach Nurse Communication: Mobility status PT Visit Diagnosis: Muscle weakness (generalized) (M62.81);Difficulty in walking, not elsewhere classified (R26.2)    Time: 9798-9211 PT Time Calculation (min) (ACUTE ONLY): 27 min   Charges:   PT Evaluation $PT Eval Moderate Complexity: 1 Mod PT Treatments $Therapeutic Exercise: 8-22 mins       D. Scott Yola Paradiso PT, DPT 06/25/21, 1:36 PM

## 2021-06-25 NOTE — Progress Notes (Signed)
NAME:  LAURIE LOVEJOY, MRN:  [id number], DOB:  04/22/1961, LOS: 6 ADMISSION DATE:  06/19/2021, CONSULTATION DATE:  06/19/2021 REFERRING MD:  [name], CHIEF COMPLAINT:  Altered Mental Status, shortness of breath   Brief Pt Description / Synopsis:  60 year old male admitted with acute metabolic encephalopathy and acute on chronic hypoxic & hypercapnic respiratory failure in setting of suspected healthcare associated pneumonia along with noncompliance with home trilogy, requiring mechanical ventilation via chronic tracheostomy.  History of Present Illness:  60 year old male with a past medical history significant for acute on chronic hypoxic and hypercapnic respiratory failure with trach dependence, HFrEF, obstructive sleep apnea, obesity hypoventilation syndrome, pulmonary hypertension, and previous noncompliance with home Trilogy who presented to [facility] ED on 06/19/2021 due to complaints of shortness of breath and confusion.  Patient is currently altered/lethargic and no family is currently available, therefore history is obtained from ED nursing notes.  Per notes it is reported he has become increasingly confused and lethargic over the past 24 hours.  This morning he exhibited increased work of breathing along with the confusion of which EMS was dispatched.  EMS found him hypoxic with O2 saturations in the 70's.  It is reported that he forgot to use him home Trilogy last night.    Of note he has had multiple recent hospitalizations, with his most recent admission at [facility] from 05/10/21 throughout 05/17/21 for acute on chronic hypoxic & Hypercapnic Respiratory Failure.   He followed up with Pulmonologist Dr. [name] on 06/15/21, where he was started on Azithromycin for Tracheitis vs Pneumonia.  Given his work of breathing and copious thick secretions from his Lurline Idol, he was placed on his Trilogy vent. He met SIRS Criteria, and given concern for possible pneumonia, sepsis workup was initiated and he was  given broad spectrum antibiotics including IV Cefepime and Vancomycin.  He also received 125 mg IV Solumedrol and bronchodilators.  PCCM is asked to admit the patient to ICU for further workup and treatment of acute metabolic encephalopathy and acute on chronic hypoxic & hypercapnic respiratory failure in setting of suspected healthcare associated pneumonia along with noncompliance with home trilogy, requiring mechanical ventilation via chronic tracheostomy.  Pertinent  Medical History  Acute on chronic hypoxic hypercapnic respiratory failure Tracheostomy dependence Obstructive sleep apnea Obesity hypoventilation syndrome Pulmonary Hypertension Hypertension  Hyperlipidemia HFrEF Pneumonia Depression GERD T8 Vertebral Fracture  Micro Data:  06/19/2021: SARS-CoV-2 and influenza PCR>> negative 06/19/2021: Blood culture>> no growth to date 06/19/2021: Urine>>no growth 06/19/2021: Sputum>>MRSA (resistance to Ciprofloxacin, Erythromycin, Oxacillin) ,RARE Pseudomonas  06/19/2021: Strep pneumo urinary antigen>>negative 06/19/2021: Legionella urinary antigen>>negative  Antimicrobials:  Cefepime 9/20>> Vancomycin 9/20>>  Significant Hospital Events: Including procedures, antibiotic start and stop dates in addition to other pertinent events   06/19/2021: Presented to ED due to shortness of breath and altered mental status.  Found to be hypercapnic requiring mechanical ventilation via chronic tracheostomy.  PCCM asked to admit 06/21/2021: Attempting TC trials, Speech evaluation, Sputum culture with Staph Aureus and rare gram negative rods 06/22/21: Tolerating TC during day, vent at night. 06/23/2021: Continues to tolerate trach collar during the day, using vent at nighttime consistently 06/24/2021: Uneventful night.  On vent at night.  Mentating well during the day  Interim History / Subjective:  No acute events ON -Tolerating TC during day, vent at night -Tracheal aspirate with MRSA and  Pseudomonas ~ on Vancomycin & Cefepime -WBC improved to 7.6 On lasix   Objective   Blood pressure (!) 153/101, pulse 88, temperature 98.4 F (  36.9 C), temperature source Axillary, resp. rate (!) 23, height 6' 0.01" (1.829 m), weight (!) 157.9 kg, SpO2 100 %.    Vent Mode: PRVC FiO2 (%):  [50 %-55 %] 50 % Set Rate:  [12 bmp] 12 bmp Vt Set:  [550 mL] 550 mL PEEP:  [5 cmH20] 5 cmH20   Intake/Output Summary (Last 24 hours) at 06/25/2021 0726 Last data filed at 06/25/2021 0321 Gross per 24 hour  Intake 2550.16 ml  Output 4150 ml  Net -1599.84 ml    Filed Weights   06/22/21 0500 06/23/21 0500 06/25/21 0453  Weight: (!) 153 kg (!) 153 kg (!) 157.9 kg    Review of Systems:  Gen:  Denies  fever, sweats, chills weight loss  +SOB Other:  All other systems negative   Physical Examination:   General Appearance: No distress  cute on chronic ill-appearing gentleman, on Trach collar, EYES PERRLA, EOM intact.   NECK Supple, No JVD Pulmonary: normal breath sounds, No wheezing.  CardiovascularNormal S1,S2.  No m/r/g.   Abdomen: Benign, Soft, non-tender. Skin:   warm, no rashes, no ecchymosis  Extremities: normal, no cyanosis, clubbing. Neuro:without focal findings,  speech normal  PSYCHIATRIC: Mood, affect within normal limits.  ALL OTHER ROS ARE NEGATIVE      Pressure Injury 06/19/21 Buttocks Right Stage 2 -  Partial thickness loss of dermis presenting as a shallow open injury with a red, pink wound bed without slough. (Active)  06/19/21 1854  Location: Buttocks  Location Orientation: Right  Staging: Stage 2 -  Partial thickness loss of dermis presenting as a shallow open injury with a red, pink wound bed without slough.  Wound Description (Comments):   Present on Admission: Yes   Scheduled Meds:  arformoterol  15 mcg Nebulization BID   vitamin C  250 mg Oral BID   aspirin EC  81 mg Oral Daily   budesonide (PULMICORT) nebulizer solution  0.5 mg Nebulization BID    chlorhexidine gluconate (MEDLINE KIT)  15 mL Mouth Rinse BID   Chlorhexidine Gluconate Cloth  6 each Topical Q0600   cholestyramine  4 g Oral BID   enoxaparin (LOVENOX) injection  0.5 mg/kg Subcutaneous QHS   famotidine (PEPCID) IV  20 mg Intravenous Q12H   fluticasone  1 spray Each Nare Daily   furosemide  40 mg Oral Daily   influenza vac split quadrivalent PF  0.5 mL Intramuscular Tomorrow-1000   lidocaine  1 patch Transdermal Q24H   mouth rinse  15 mL Mouth Rinse 10 times per day   metoprolol tartrate  25 mg Oral Daily   multivitamin with minerals  1 tablet Oral Daily   mupirocin ointment   Nasal BID   Ensure Max Protein  11 oz Oral BID   revefenacin  175 mcg Nebulization Daily   sertraline  25 mg Oral Daily   Continuous Infusions:  ceFEPime (MAXIPIME) IV Stopped (06/25/21 0555)   vancomycin Stopped (06/25/21 0045)   PRN Meds:.acetaminophen, albuterol, ALPRAZolam, docusate sodium, hydrALAZINE, loperamide, ondansetron (ZOFRAN) IV, oxyCODONE, polyethylene glycol    Assessment & Plan:   Acute on Chronic Hypoxic & Hypercapnic Respiratory Failure due to MRSA & Pseudomonas Pneumonia versus left lung atelectasis, & noncompliance with home Trilogy PMHx of Tracheostomy dependence, Obstructive Sleep Apnea, Obesity Hypoventilation syndrome, Pulmonary Hypertension Vent at night Wean fio2 as tolerated -On VAP Bundle -PRN Bronchodilators -Brovana ,Budesonide and Yupelri nebs, Chest PT, and Bronchial hygiene -Continue Cefepime & Vancomycin -We will likely need skilled nursing given his failure to  thrive and noncompliance at home    Sepsis present on admission secondary to MRSA & Pseudomonas Pneumonia (Healthcare associated) Continue IV abx as prescribed   Chronic HFrEF without acute Exacerbation PMHx of Hypertension Lasix as tolerated   Acute Metabolic Encephalopathy due to Hypercapnia~resolved PMHx of Depression -UDS + for Cannabinoid   Best Practice (right click and  "Reselect all SmartList Selections" daily)   Diet/type: NPO, SLP evaluation pending DVT prophylaxis: LMWH GI prophylaxis: H2B Lines: N/A Foley:  N/A Code Status:  full code  Last date of multidisciplinary goals of care discussion [06/22/2021], Palliative Care consult placed for realistic goals of care discussion. Placed OT PT consults for evaluation of discharge needs     Glendia Olshefski [name], M.D.  [facility]  Medical Director [place] Director [facility] Cardio-Pulmonary Department

## 2021-06-25 NOTE — Consult Note (Signed)
PHARMACY CONSULT NOTE - FOLLOW UP  Pharmacy Consult for Electrolyte Monitoring and Replacement   Recent Labs: Potassium (mmol/L)  Date Value  06/25/2021 4.5  05/04/2014 4.3   Magnesium (mg/dL)  Date Value  06/25/2021 2.0   Calcium (mg/dL)  Date Value  06/25/2021 9.1   Calcium, Total (mg/dL)  Date Value  05/04/2014 9.1   Albumin (g/dL)  Date Value  06/19/2021 4.0   Phosphorus (mg/dL)  Date Value  06/25/2021 3.1   Sodium (mmol/L)  Date Value  06/25/2021 139  05/04/2014 140     Assessment: Patient admitted with altered mental status and shortness of breath. Patient with chronic tracheostomy, requiring ventilator support. Pharmacy was consulted for electrolyte management.  Goal of Therapy:  Electrolytes WNL  Plan:  --No supplementation indicated at this time --Continue to follow along  Tawnya Crook, PharmD, BCPS Clinical Pharmacist 06/25/2021 11:16 AM

## 2021-06-25 NOTE — Evaluation (Addendum)
Occupational Therapy Evaluation Patient Details Name: Eddie Chapman MRN: [id number] DOB: 1961/09/01 Today's Date: 06/25/2021   History of Present Illness Pt is a 60 year old male with chronic tracheostomy for chronic hypoxic and hypercapnic respiratory failure who presented to the ED for acute shortness of breath.  Pt's oxygen saturations were noted to be in the 70s by EMS.  He was also noted to have altered mental status in the emergency department.  MD assessment includes: Acute on Chronic Hypoxic & Hypercapnic Respiratory Failure due to MRSA & Pseudomonas Pneumonia versus left lung atelectasis, & noncompliance with home Trilogy, sepsis, Acute Metabolic Encephalopathy due to Hypercapnia~resolved, and hyperglycemia. PMH includes COPD, obesity hypoventilation syndrome, and HFpEF.   Clinical Impression   Mr Hyneman was seen for OT evaluation this date. Prior to hospital admission, pt was MOD I for mobility and required assist for ADLs, unable to do steps in/out of house. Pt lives with significant other in home c 2 STE. Pt presents to acute OT demonstrating impaired ADL performance and functional mobility 2/2 decreased activity tolerance, poor safety awareness, and decreased insight into deficits.   Pt currently requires MAX A for LB access seated EOC. MIN A + RW for ADL t/f - assist to stabilize RW/chair as pt uses momentum to achieve standing, initially unable to achieve standing without chair/RW stabilization. Pt requires BUE support + RW for static standing - unable to tolerate for dynamic tasks, poor tolerance. Pt would benefit from skilled OT to address noted impairments and functional limitations (see below for any additional details) in order to maximize safety and independence while minimizing falls risk and caregiver burden. Upon hospital discharge, recommend STR to maximize pt safety and return to PLOF.       Recommendations for follow up therapy are one component of a multi-disciplinary  discharge planning process, led by the attending physician.  Recommendations may be updated based on patient status, additional functional criteria and insurance authorization.   Follow Up Recommendations  SNF    Equipment Recommendations  Other (comment) (defer to next venue of care)    Recommendations for Other Services       Precautions / Restrictions Precautions Precautions: Fall Precaution Comments: HOB >/= 30 deg Restrictions Weight Bearing Restrictions: No      Mobility Bed Mobility Overal bed mobility: Modified Independent             General bed mobility comments: pt received and left in chair    Transfers Overall transfer level: Needs assistance Equipment used: Rolling walker (2 wheeled) Transfers: Sit to/from Stand Sit to Stand: Min assist         General transfer comment: poor eccentric control    Balance Overall balance assessment: Needs assistance Sitting-balance support: No upper extremity supported;Feet supported Sitting balance-Leahy Scale: Good     Standing balance support: Bilateral upper extremity supported Standing balance-Leahy Scale: Poor                             ADL either performed or assessed with clinical judgement   ADL Overall ADL's : Needs assistance/impaired                                       General ADL Comments: MAX A for LB access seated EOC. MIN A + RW for ADL t/f - assist to stabilize RW/chair as pt uses momentum  to achieve standing. Pt requires BUE support + RW for static standing - unable to tolerate for dynamic tasks, poor tolerance      Pertinent Vitals/Pain Pain Assessment: No/denies pain     Hand Dominance Right   Extremity/Trunk Assessment Upper Extremity Assessment Upper Extremity Assessment: Generalized weakness   Lower Extremity Assessment Lower Extremity Assessment: Generalized weakness       Communication Communication Communication: Tracheostomy    Cognition Arousal/Alertness: Awake/alert Behavior During Therapy: WFL for tasks assessed/performed Overall Cognitive Status: Within Functional Limits for tasks assessed                                 General Comments: pt demonstrates baseline safety awareness deficits and poor insight into deficits   General Comments  HR 128 static standing    Exercises Exercises: Other exercisesOther Exercises Other Exercises: pt educated re: OT role, DME recs, d/c recs, falls prevention, ECS Other Exercises: simulated UBD/LBD, sit<>stand x2, sitting/standing balance/tolerance   Shoulder Instructions      Home Living Family/patient expects to be discharged to:: Private residence Living Arrangements: Spouse/significant other Available Help at Discharge: Family;Available 24 hours/day Type of Home: House Home Access: Stairs to enter [employer] of Steps: 2 Entrance Stairs-Rails: None Home Layout: One level     Bathroom Shower/Tub: Teacher, early years/pre: Standard     Home Equipment: Environmental consultant - 2 wheels;Bedside commode;Other (comment);Grab bars - tub/shower;Grab bars - toilet;Hospital bed;Walker - 4 wheels          Prior Functioning/Environment Level of Independence: Needs assistance  Gait / Transfers Assistance Needed: Mod Ind amb limited household distances with a RW, unable to navigate stairs secondary to BLE weakness, uses EMS transport for in/out of home ADL's / Homemaking Assistance Needed: Spouse assists PRN with ADLs            OT Problem List: Decreased strength;Decreased range of motion;Decreased activity tolerance;Impaired balance (sitting and/or standing);Decreased safety awareness;Decreased cognition      OT Treatment/Interventions: Self-care/ADL training;Therapeutic exercise;Energy conservation;DME and/or AE instruction;Therapeutic activities;Patient/family education;Balance training    OT Goals(Current goals can be found in the care  plan section) Acute Rehab OT Goals Patient Stated Goal: To get stronger OT Goal Formulation: With patient Time For Goal Achievement: 07/09/21 Potential to Achieve Goals: Fair ADL Goals Pt Will Perform Lower Body Dressing: with set-up;with supervision;sit to/from stand (c LRAD PRN) Pt Will Transfer to Toilet: with modified independence;ambulating;regular height toilet (c LRAD PRN) Pt Will Perform Toileting - Clothing Manipulation and hygiene: with set-up;sit to/from stand (c LRAD PRN) Additional ADL Goal #1: Pt will Independently verbalize plan to implement x3 falls prevention strategies  OT Frequency: Min 1X/week   Barriers to D/C: Decreased caregiver support          Co-evaluation              AM-PAC OT "6 Clicks" Daily Activity     Outcome Measure Help from another person eating meals?: None Help from another person taking care of personal grooming?: A Lot Help from another person toileting, which includes using toliet, bedpan, or urinal?: A Lot Help from another person bathing (including washing, rinsing, drying)?: A Lot Help from another person to put on and taking off regular upper body clothing?: A Little Help from another person to put on and taking off regular lower body clothing?: A Lot 6 Click Score: 15   End of Session Equipment Utilized During Treatment:  Rolling walker;Oxygen Nurse Communication: Mobility status  Activity Tolerance: Patient tolerated treatment well Patient left: with call bell/phone within reach;in chair  OT Visit Diagnosis: Other abnormalities of gait and mobility (R26.89);Muscle weakness (generalized) (M62.81)                Time: 7199-4129 OT Time Calculation (min): 11 min Charges:  OT General Charges $OT Visit: 1 Visit OT Evaluation $OT Eval Low Complexity: 1 Low  Dessie Coma, M.S. OTR/L  06/25/21, 2:41 PM  ascom [phone]

## 2021-06-26 DIAGNOSIS — J189 Pneumonia, unspecified organism: Secondary | ICD-10-CM | POA: Diagnosis not present

## 2021-06-26 DIAGNOSIS — J9621 Acute and chronic respiratory failure with hypoxia: Secondary | ICD-10-CM | POA: Diagnosis not present

## 2021-06-26 DIAGNOSIS — J9622 Acute and chronic respiratory failure with hypercapnia: Secondary | ICD-10-CM | POA: Diagnosis not present

## 2021-06-26 DIAGNOSIS — Z7189 Other specified counseling: Secondary | ICD-10-CM

## 2021-06-26 LAB — MAGNESIUM: Magnesium: 2.1 mg/dL (ref 1.7–2.4)

## 2021-06-26 LAB — BASIC METABOLIC PANEL
Anion gap: 8 (ref 5–15)
BUN: 19 mg/dL (ref 8–23)
CO2: 34 mmol/L — ABNORMAL HIGH (ref 22–32)
Calcium: 8.9 mg/dL (ref 8.9–10.3)
Chloride: 96 mmol/L — ABNORMAL LOW (ref 98–111)
Creatinine, Ser: 0.71 mg/dL (ref 0.61–1.24)
GFR, Estimated: >60 mL/min (ref >60)
Glucose, Bld: 196 mg/dL — ABNORMAL HIGH (ref 70–99)
Potassium: 4 mmol/L (ref 3.5–5.1)
Sodium: 138 mmol/L (ref 135–145)

## 2021-06-26 MED ORDER — FAMOTIDINE 20 MG PO TABS
20.0000 mg | ORAL_TABLET | Freq: Two times a day (BID) | ORAL | Status: DC
  Administered 2021-06-26 – 2021-06-28 (×4): 20 mg via ORAL
  Filled 2021-06-26 (×4): qty 1

## 2021-06-26 NOTE — TOC Progression Note (Signed)
Transition of Care [facility]) - Progression Note    Patient Details  Name: Eddie Chapman MRN: [id number] Date of Birth: 1960/12/06  Transition of Care [facility]) CM/SW [place], [place] Phone Number: [phone] 06/26/2021, 3:37 PM  Clinical Narrative:      PT/OT have recommended SNF placement for patient.  Patient has adamantly refused SNF placement because his preferred facilities cannot be guaranteed.  CSW called to speak with patient's significant other Bubba Hales [phone] about PT/OT recommendations. Ms. Johny Blamer was with [employer] from [facility] training for Trilogy vent.  Plan is for d/c home with care at home provided by Ms. Baum.  Ms. Johny Blamer requested CSW call her back.   Expected Discharge Plan: [place] Barriers to Discharge: Continued Medical Work up  Expected Discharge Plan and Services Expected Discharge Plan: [place] In-house Referral: Clinical Social Work                       DME Arranged: Ventilator DME Agency: [employer] Date DME Agency Contacted: 06/21/21 Time DME Agency Contacted: [phone]               Social Determinants of Health (SDOH) Interventions    Readmission Risk Interventions Readmission Risk Prevention Plan 06/21/2021  Transportation Screening Complete  PCP or Specialist Appt within 3-5 Days Complete  HRI or [place] Complete  Social Work Consult for [place] Planning/Counseling Complete  Palliative Care Screening Not Applicable  Medication Review Press photographer) Complete  Some recent data might be hidden

## 2021-06-26 NOTE — Consult Note (Signed)
Consultation Note Date: 06/26/2021   Patient Name: Eddie Chapman  DOB: April 03, 1961  MRN: [id number]  Age / Sex: 60 y.o., male  PCP: Adin Hector, MD Referring Physician: Flora Lipps, MD  Reason for Consultation: Establishing goals of care  HPI/Patient Profile: 60 year old male with a past medical history significant for acute on chronic hypoxic and hypercapnic respiratory failure with trach dependence, HFrEF, obstructive sleep apnea, obesity hypoventilation syndrome, pulmonary hypertension, and previous noncompliance with home Trilogy who presented to [facility] ED on 06/19/2021 due to complaints of shortness of breath and confusion.  Clinical Assessment and Goals of Care: Patient is known to PMT team from previous admissions. He is resting in bed with PMV in place. He states since last discharge, he has been using his trilogy ventilator the way he has been advised to except for the night prior to arrival. He states he had a large amount of secretions he could not cough up, and when he was hooked up, he felt like he was choking so he did not use it. Discussed endotracheal suctioning for secretions. He states he will remember this for the future. His home trilogy has been brought to bedside for use and instruction. He states he is aware his CO2 builds up and causes confusion and then hospitalization. He states  "I'll know I'll need to make modifications if I want to live." Patient would like to continue full code/full scope.      SUMMARY OF RECOMMENDATIONS   Full code/ full scope.  Discussed endotracheal suctioning for excessive secretions.    Prognosis:  Unable to determine       Primary Diagnoses: Present on Admission:  Acute on chronic respiratory failure with hypercapnia ([place])   I have reviewed the medical record, interviewed the patient and family, and examined the patient. The following aspects are  pertinent.  Past Medical History:  Diagnosis Date   Adenomatous colon polyp    Collapsed lung 08/2020   left   Depression    Ear drum perforation    left x2    GERD (gastroesophageal reflux disease)    Hyperlipidemia    Hypertension    Pneumonia    T8 vertebral fracture (HCC)    Social History   Socioeconomic History   Marital status: Single    Spouse name: Not on file   Number of children: Not on file   Years of education: Not on file   Highest education level: Not on file  Occupational History   Not on file  Tobacco Use   Smoking status: Never   Smokeless tobacco: Never  Vaping Use   Vaping Use: Never used  Substance and Sexual Activity   Alcohol use: Never   Drug use: Never   Sexual activity: Not Currently    Partners: Female    Birth control/protection: None  Other Topics Concern   Not on file  Social History Narrative   Not on file   Social Determinants of Health   Financial Resource Strain: Not on file  Food Insecurity: Not on file  Transportation Needs: Not on file  Physical Activity: Not on file  Stress: Not on file  Social Connections: Not on file   Family History  Problem Relation Age of Onset   Cancer Mother    Varicose Veins Mother    Scheduled Meds:  arformoterol  15 mcg Nebulization BID   vitamin C  250 mg Oral BID   aspirin EC  81 mg Oral Daily   budesonide (PULMICORT) nebulizer solution  0.5 mg Nebulization BID   chlorhexidine gluconate (MEDLINE KIT)  15 mL Mouth Rinse BID   Chlorhexidine Gluconate Cloth  6 each Topical Q0600   cholestyramine  4 g Oral BID   enoxaparin (LOVENOX) injection  0.5 mg/kg Subcutaneous QHS   famotidine (PEPCID) IV  20 mg Intravenous Q12H   fluticasone  1 spray Each Nare Daily   furosemide  40 mg Oral Daily   lidocaine  1 patch Transdermal Q24H   mouth rinse  15 mL Mouth Rinse 10 times per day   metoprolol tartrate  25 mg Oral Daily   multivitamin with minerals  1 tablet Oral Daily   mupirocin ointment    Nasal BID   predniSONE  20 mg Oral Q breakfast   Ensure Max Protein  11 oz Oral BID   revefenacin  175 mcg Nebulization Daily   sertraline  25 mg Oral Daily   Continuous Infusions: PRN Meds:.acetaminophen, albuterol, ALPRAZolam, docusate sodium, hydrALAZINE, loperamide, ondansetron (ZOFRAN) IV, oxyCODONE, polyethylene glycol Medications Prior to Admission:  Prior to Admission medications   Medication Sig Start Date End Date Taking? Authorizing Provider  albuterol (VENTOLIN HFA) 108 (90 Base) MCG/ACT inhaler Inhale 2 puffs into the lungs every 6 (six) hours as needed for wheezing or shortness of breath.   Yes [provider]  aspirin EC 81 MG tablet Take 81 mg by mouth daily.   Yes [provider]  azithromycin (ZITHROMAX) 250 MG tablet Take 250 mg by mouth every Monday, Wednesday, and Friday. 06/16/21  Yes [provider]  cholecalciferol (VITAMIN D3) 25 MCG (1000 UNIT) tablet Take 4,000 Units by mouth daily.   Yes [provider]  furosemide (LASIX) 40 MG tablet Take 1 tablet (40 mg total) by mouth daily. 05/18/21  Yes Kasa, Maretta Bees, MD  glycopyrrolate (ROBINUL) 1 MG tablet Take 0.5 mg by mouth 3 (three) times daily. 06/15/21  Yes [provider]  ipratropium-albuterol (DUONEB) 0.5-2.5 (3) MG/3ML SOLN Take 3 mLs by nebulization every 4 (four) hours as needed. 05/17/21  Yes Flora Lipps, MD  metoprolol tartrate (LOPRESSOR) 25 MG tablet Take 1 tablet (25 mg total) by mouth daily. 05/18/21  Yes Flora Lipps, MD  sertraline (ZOLOFT) 25 MG tablet Take 1 tablet (25 mg total) by mouth daily. 05/18/21  Yes Flora Lipps, MD  famotidine (PEPCID) 20 MG tablet Take 1 tablet (20 mg total) by mouth 2 (two) times daily. 05/04/21 06/03/21  Max Sane, MD   Allergies  Allergen Reactions   Amlodipine Swelling    Ankle swelling    Divalproex Sodium Other (See Comments)    Elevated liver enzymes   Review of Systems  All other systems reviewed and are  negative.  Physical Exam Pulmonary:     Comments: PMV in place over trach.  Neurological:     Mental Status: He is alert.    Vital Signs: BP 103/73   Pulse 81   Temp 98.8 F (37.1 C) (Axillary)   Resp 17   Ht 6'  0.01" (1.829 m)   Wt (!) 155.6 kg   SpO2 98%   BMI 46.51 kg/m  Pain Scale: 0-10   Pain Score: 0-No pain   SpO2: SpO2: 98 % O2 Device:SpO2: 98 % O2 Flow Rate: .O2 Flow Rate (L/min): 10 L/min  IO: Intake/output summary:  Intake/Output Summary (Last 24 hours) at 06/26/2021 1229 Last data filed at 06/26/2021 0600 Gross per 24 hour  Intake 960 ml  Output 2900 ml  Net -1940 ml    LBM: Last BM Date: 06/26/21 Baseline Weight: Weight: (!) 146 kg Most recent weight: Weight: (!) 155.6 kg       Time In: 11:50 Time Out: 12:30 Time Total: 40 min Greater than 50%  of this time was spent counseling and coordinating care related to the above assessment and plan.  Signed by: Asencion Gowda, NP   Please contact Palliative Medicine Team phone at [phone] for questions and concerns.  For individual provider: See Shea Evans

## 2021-06-26 NOTE — Progress Notes (Signed)
NAME:  Eddie Chapman, MRN:  [id number], DOB:  Oct 21, 1960, LOS: 7 ADMISSION DATE:  06/19/2021, CONSULTATION DATE:  06/19/2021 REFERRING MD:  [name], CHIEF COMPLAINT:  Altered Mental Status, shortness of breath   Brief Pt Description / Synopsis:  60 year old male admitted with acute metabolic encephalopathy and acute on chronic hypoxic & hypercapnic respiratory failure in setting of suspected healthcare associated pneumonia along with noncompliance with home trilogy, requiring mechanical ventilation via chronic tracheostomy.  History of Present Illness:  60 year old male with a past medical history significant for acute on chronic hypoxic and hypercapnic respiratory failure with trach dependence, HFrEF, obstructive sleep apnea, obesity hypoventilation syndrome, pulmonary hypertension, and previous noncompliance with home Trilogy who presented to [facility] ED on 06/19/2021 due to complaints of shortness of breath and confusion.  Patient is currently altered/lethargic and no family is currently available, therefore history is obtained from ED nursing notes.  Per notes it is reported he has become increasingly confused and lethargic over the past 24 hours.  This morning he exhibited increased work of breathing along with the confusion of which EMS was dispatched.  EMS found him hypoxic with O2 saturations in the 70's.  It is reported that he forgot to use him home Trilogy last night.    Of note he has had multiple recent hospitalizations, with his most recent admission at [name] from 05/10/21 throughout 05/17/21 for acute on chronic hypoxic & Hypercapnic Respiratory Failure.   He followed up with Pulmonologist Dr. [name] on 06/15/21, where he was started on Azithromycin for Tracheitis vs Pneumonia.  Given his work of breathing and copious thick secretions from his Lurline Idol, he was placed on his Trilogy vent. He met SIRS Criteria, and given concern for possible pneumonia, sepsis workup was initiated and he was  given broad spectrum antibiotics including IV Cefepime and Vancomycin.  He also received 125 mg IV Solumedrol and bronchodilators.  PCCM is asked to admit the patient to ICU for further workup and treatment of acute metabolic encephalopathy and acute on chronic hypoxic & hypercapnic respiratory failure in setting of suspected healthcare associated pneumonia along with noncompliance with home trilogy, requiring mechanical ventilation via chronic tracheostomy.  Pertinent  Medical History  Acute on chronic hypoxic hypercapnic respiratory failure Tracheostomy dependence Obstructive sleep apnea Obesity hypoventilation syndrome Pulmonary Hypertension Hypertension  Hyperlipidemia HFrEF Pneumonia Depression GERD T8 Vertebral Fracture  Micro Data:  06/19/2021: SARS-CoV-2 and influenza PCR>> negative 06/19/2021: Blood culture>> no growth to date 06/19/2021: Urine>>no growth 06/19/2021: Sputum>>MRSA (resistance to Ciprofloxacin, Erythromycin, Oxacillin) ,RARE Pseudomonas  06/19/2021: Strep pneumo urinary antigen>>negative 06/19/2021: Legionella urinary antigen>>negative  Antimicrobials:  Cefepime 9/20>> Vancomycin 9/20>>  Significant Hospital Events: Including procedures, antibiotic start and stop dates in addition to other pertinent events   06/19/2021: Presented to ED due to shortness of breath and altered mental status.  Found to be hypercapnic requiring mechanical ventilation via chronic tracheostomy.  PCCM asked to admit 06/21/2021: Attempting TC trials, Speech evaluation, Sputum culture with Staph Aureus and rare gram negative rods 06/22/21: Tolerating TC during day, vent at night. 06/23/2021: Continues to tolerate trach collar during the day, using vent at nighttime consistently 06/24/2021: Uneventful night.  On vent at night.  Mentating well during the day  Interim History / Subjective:  Vent at night No acute issues MRSA/Pseudinomas pneumonia On lasix [employer] to visit home Compliance  report reviewed   Objective   Blood pressure (!) 138/96, pulse (!) 102, temperature 98.8 F (37.1 C), temperature source Axillary, resp. rate (!) 28, height  6' 0.01" (1.829 m), weight (!) 155.6 kg, SpO2 100 %.    Vent Mode: PRVC FiO2 (%):  [50 %] 50 % Set Rate:  [12 bmp] 12 bmp Vt Set:  [550 mL] 550 mL PEEP:  [5 cmH20] 5 cmH20   Intake/Output Summary (Last 24 hours) at 06/26/2021 0717 Last data filed at 06/26/2021 0600 Gross per 24 hour  Intake 1740 ml  Output 3250 ml  Net -1510 ml    Filed Weights   06/23/21 0500 06/25/21 0453 06/26/21 0500  Weight: (!) 153 kg (!) 157.9 kg (!) 155.6 kg     Review of Systems: +SOB Other:  All other systems negative   Physical Examination:   General Appearance: No distress  EYES PERRLA, EOM intact.   NECK Supple, No JVD s/p trach Pulmonary: normal breath sounds, No wheezing.  CardiovascularNormal S1,S2.  No m/r/g.   Abdomen: Benign, Soft, non-tender. Skin:   warm, no rashes, no ecchymosis  Extremities: normal, no cyanosis, clubbing. Neuro:without focal findings,  speech normal  PSYCHIATRIC: Mood, affect within normal limits.   ALL OTHER ROS ARE NEGATIVE     Pressure Injury 06/19/21 Buttocks Right Stage 2 -  Partial thickness loss of dermis presenting as a shallow open injury with a red, pink wound bed without slough. (Active)  06/19/21 1854  Location: Buttocks  Location Orientation: Right  Staging: Stage 2 -  Partial thickness loss of dermis presenting as a shallow open injury with a red, pink wound bed without slough.  Wound Description (Comments):   Present on Admission: Yes   Scheduled Meds:  arformoterol  15 mcg Nebulization BID   vitamin C  250 mg Oral BID   aspirin EC  81 mg Oral Daily   budesonide (PULMICORT) nebulizer solution  0.5 mg Nebulization BID   chlorhexidine gluconate (MEDLINE KIT)  15 mL Mouth Rinse BID   Chlorhexidine Gluconate Cloth  6 each Topical Q0600   cholestyramine  4 g Oral BID   enoxaparin  (LOVENOX) injection  0.5 mg/kg Subcutaneous QHS   famotidine (PEPCID) IV  20 mg Intravenous Q12H   fluticasone  1 spray Each Nare Daily   furosemide  40 mg Oral Daily   lidocaine  1 patch Transdermal Q24H   mouth rinse  15 mL Mouth Rinse 10 times per day   metoprolol tartrate  25 mg Oral Daily   multivitamin with minerals  1 tablet Oral Daily   mupirocin ointment   Nasal BID   predniSONE  20 mg Oral Q breakfast   Ensure Max Protein  11 oz Oral BID   revefenacin  175 mcg Nebulization Daily   sertraline  25 mg Oral Daily   Continuous Infusions:   PRN Meds:.acetaminophen, albuterol, ALPRAZolam, docusate sodium, hydrALAZINE, loperamide, ondansetron (ZOFRAN) IV, oxyCODONE, polyethylene glycol    Assessment & Plan:   Acute on Chronic Hypoxic & Hypercapnic Respiratory Failure due to MRSA & Pseudomonas Pneumonia versus left lung atelectasis, & noncompliance with home Trilogy, VENT dependent at night PMHx of Tracheostomy dependence, Obstructive Sleep Apnea, Obesity Hypoventilation syndrome, Pulmonary Hypertension Wean fio2 as tolerated -On VAP Bundle -PRN Bronchodilators -Brovana ,Budesonide and Yupelri nebs, Chest PT, and Bronchial hygiene -Continue Cefepime & Vancomycin   Sepsis present on admission secondary to MRSA & Pseudomonas Pneumonia (Healthcare associated) Continue IV abx as prescribed-will plan for deescalating   Chronic HFrEF without acute Exacerbation Lasix as tolerated   Acute Metabolic Encephalopathy due to Hypercapnia~resolved PMHx of Depression -UDS + for Cannabinoid   Best Practice (right  click and "Reselect all SmartList Selections" daily)   Diet/type: NPO, SLP evaluation pending DVT prophylaxis: LMWH GI prophylaxis: H2B Lines: N/A Foley:  N/A Code Status:  full code  Plan for D/c Home with vent in next 24 hrs  Geneen Dieter [name], M.D.  [facility]  Medical Director [place] Director [facility]  Cardio-Pulmonary Department

## 2021-06-26 NOTE — Consult Note (Signed)
PHARMACY CONSULT NOTE - FOLLOW UP  Pharmacy Consult for Electrolyte Monitoring and Replacement   Recent Labs: Potassium (mmol/L)  Date Value  06/26/2021 4.0  05/04/2014 4.3   Magnesium (mg/dL)  Date Value  06/26/2021 2.1   Calcium (mg/dL)  Date Value  06/26/2021 8.9   Calcium, Total (mg/dL)  Date Value  05/04/2014 9.1   Albumin (g/dL)  Date Value  06/19/2021 4.0   Phosphorus (mg/dL)  Date Value  06/25/2021 3.1   Sodium (mmol/L)  Date Value  06/26/2021 138  05/04/2014 140     Assessment: Patient admitted with altered mental status and shortness of breath. Patient with chronic tracheostomy, requiring ventilator support. Pharmacy was consulted for electrolyte management.  Goal of Therapy:  Electrolytes WNL  Plan:  --No supplementation indicated at this time --Continue to follow along  Tawnya Crook, PharmD, BCPS Clinical Pharmacist 06/26/2021 10:57 AM

## 2021-06-27 DIAGNOSIS — J9601 Acute respiratory failure with hypoxia: Secondary | ICD-10-CM

## 2021-06-27 DIAGNOSIS — J9602 Acute respiratory failure with hypercapnia: Secondary | ICD-10-CM

## 2021-06-27 LAB — BASIC METABOLIC PANEL
Anion gap: 10 (ref 5–15)
BUN: 21 mg/dL (ref 8–23)
CO2: 36 mmol/L — ABNORMAL HIGH (ref 22–32)
Calcium: 9.3 mg/dL (ref 8.9–10.3)
Chloride: 94 mmol/L — ABNORMAL LOW (ref 98–111)
Creatinine, Ser: 0.68 mg/dL (ref 0.61–1.24)
GFR, Estimated: >60 mL/min (ref >60)
Glucose, Bld: 139 mg/dL — ABNORMAL HIGH (ref 70–99)
Potassium: 4.2 mmol/L (ref 3.5–5.1)
Sodium: 140 mmol/L (ref 135–145)

## 2021-06-27 LAB — MAGNESIUM: Magnesium: 2.2 mg/dL (ref 1.7–2.4)

## 2021-06-27 NOTE — Progress Notes (Signed)
Physical Therapy Treatment Patient Details Name: Eddie Chapman MRN: [id number] DOB: 12/20/1960 Today's Date: 06/27/2021   History of Present Illness Pt is a 60 year old male with chronic tracheostomy for chronic hypoxic and hypercapnic respiratory failure who presented to the ED for acute shortness of breath.  Pt's oxygen saturations were noted to be in the 70s by EMS.  He was also noted to have altered mental status in the emergency department.  MD assessment includes: Acute on Chronic Hypoxic & Hypercapnic Respiratory Failure due to MRSA & Pseudomonas Pneumonia versus left lung atelectasis, & noncompliance with home Trilogy, sepsis, Acute Metabolic Encephalopathy due to Hypercapnia~resolved, and hyperglycemia. PMH includes COPD, obesity hypoventilation syndrome, and HFpEF.    PT Comments    Pt was pleasant and motivated to participate during the session.  Pt required extra time and effort with functional tasks but no physical assistance.  Pt was steady in unsupported sitting and in standing with BUE support with SpO2 and HR WNL on TC.  Pt performed multiple sit to/from stand transfers from various height surfaces with good control and was able to take several steps to the chair without LOB or buckling.  Pt will benefit from PT services in a SNF setting upon discharge to safely address deficits listed in patient problem list for decreased caregiver assistance and eventual return to PLOF.     Recommendations for follow up therapy are one component of a multi-disciplinary discharge planning process, led by the attending physician.  Recommendations may be updated based on patient status, additional functional criteria and insurance authorization.  Follow Up Recommendations  SNF;Supervision/Assistance - 24 hour     Equipment Recommendations  None recommended by PT    Recommendations for Other Services       Precautions / Restrictions Precautions Precautions: Fall Precaution Comments: HOB >/=  30 deg Restrictions Weight Bearing Restrictions: No     Mobility  Bed Mobility Overal bed mobility: Modified Independent             General bed mobility comments: Extra time and effort only    Transfers Overall transfer level: Needs assistance Equipment used: Rolling walker (2 wheeled) Transfers: Sit to/from Stand Sit to Stand: Min guard         General transfer comment: Good eccentric and concentric control and stability  Ambulation/Gait Ambulation/Gait assistance: Min guard Gait Distance (Feet): 2 Feet Assistive device: Rolling walker (2 wheeled) Gait Pattern/deviations: Step-through pattern;Decreased step length - right;Decreased step length - left Gait velocity: decreased   General Gait Details: Pt able to take several very small steps from bed to chair with mod lean on the RW for support   Stairs             Wheelchair Mobility    Modified Rankin (Stroke Patients Only)       Balance Overall balance assessment: Needs assistance Sitting-balance support: No upper extremity supported;Feet supported Sitting balance-Leahy Scale: Good     Standing balance support: Bilateral upper extremity supported;During functional activity Standing balance-Leahy Scale: Fair                              Cognition Arousal/Alertness: Awake/alert Behavior During Therapy: WFL for tasks assessed/performed Overall Cognitive Status: Within Functional Limits for tasks assessed  Exercises Static sitting and standing to tolerance for improved functional strength and activity tolerance   General Comments        Pertinent Vitals/Pain Pain Assessment: 0-10 Pain Score: 2  Pain Location: back Pain Descriptors / Indicators: Sore Pain Intervention(s): Repositioned;Monitored during session    Home Living                      Prior Function            PT Goals (current goals can now be  found in the care plan section) Progress towards PT goals: Progressing toward goals    Frequency    Min 2X/week      PT Plan Current plan remains appropriate    Co-evaluation              AM-PAC PT "6 Clicks" Mobility   Outcome Measure  Help needed turning from your back to your side while in a flat bed without using bedrails?: A Little Help needed moving from lying on your back to sitting on the side of a flat bed without using bedrails?: A Little Help needed moving to and from a bed to a chair (including a wheelchair)?: A Little Help needed standing up from a chair using your arms (e.g., wheelchair or bedside chair)?: A Little Help needed to walk in hospital room?: A Lot Help needed climbing 3-5 steps with a railing? : Total 6 Click Score: 15    End of Session Equipment Utilized During Treatment: Gait belt;Oxygen Activity Tolerance: Patient tolerated treatment well Patient left: in chair;with call bell/phone within reach;with family/visitor present Nurse Communication: Mobility status PT Visit Diagnosis: Muscle weakness (generalized) (M62.81);Difficulty in walking, not elsewhere classified (R26.2)     Time: 5465-6812 PT Time Calculation (min) (ACUTE ONLY): 23 min  Charges:  $Therapeutic Exercise: 8-22 mins $Therapeutic Activity: 8-22 mins                     D. Scott Joana Nolton PT, DPT 06/27/21, 5:28 PM

## 2021-06-27 NOTE — Progress Notes (Signed)
RT assisted patient with placement on home ventilator and home settings at this time. Home unit observed to be in proper working condition, plugged into red outlet, no loose or frayed cords, with 5L oxygen bleed in connected. Patient tolerating well at this time. Will continue to monitor.

## 2021-06-27 NOTE — TOC Progression Note (Signed)
Transition of Care [facility]) - Progression Note    Patient Details  Name: Eddie Chapman MRN: [id number] Date of Birth: 11-22-1960  [facility] CM/SW Contact  Kerin Salen, RN Phone Number: 06/27/2021, 1:28 PM  Clinical Narrative:  Spoke with patient who says trilogy training done yesterday, everything went well with Ebony Hail and himself, they both feel confident that they will be able to operate the device safely at home. Discharge tomorrow, 9/29.     Expected Discharge Plan: [place] Barriers to Discharge: Continued Medical Work up  Expected Discharge Plan and Services Expected Discharge Plan: [place] In-house Referral: Clinical Social Work                       DME Arranged: Ventilator DME Agency: [employer] Date DME Agency Contacted: 06/21/21 Time DME Agency Contacted: [phone]               Social Determinants of Health (SDOH) Interventions    Readmission Risk Interventions Readmission Risk Prevention Plan 06/21/2021  Transportation Screening Complete  PCP or Specialist Appt within 3-5 Days Complete  HRI or [place] Complete  Social Work Consult for [place] Planning/Counseling Complete  Palliative Care Screening Not Applicable  Medication Review Press photographer) Complete  Some recent data might be hidden

## 2021-06-27 NOTE — Progress Notes (Signed)
NAME:  Eddie Chapman, MRN:  [id number], DOB:  [birth year], LOS: 8 ADMISSION DATE:  06/19/2021, CONSULTATION DATE:  06/19/2021 REFERRING MD:  [name], CHIEF COMPLAINT:  Altered Mental Status, shortness of breath   Brief Pt Description / Synopsis:  60 year old male admitted with acute metabolic encephalopathy and acute on chronic hypoxic & hypercapnic respiratory failure in setting of suspected healthcare associated pneumonia along with noncompliance with home trilogy, requiring mechanical ventilation via chronic tracheostomy.  Pertinent  Medical History  Acute on chronic hypoxic hypercapnic respiratory failure Tracheostomy dependence Obstructive sleep apnea Obesity hypoventilation syndrome Pulmonary Hypertension Hypertension  Hyperlipidemia HFrEF Pneumonia Depression GERD T8 Vertebral Fracture  Micro Data:  06/19/2021: SARS-CoV-2 and influenza PCR>> negative 06/19/2021: Blood culture>> no growth to date 06/19/2021: Urine>>no growth 06/19/2021: Sputum>>MRSA (resistance to Ciprofloxacin, Erythromycin, Oxacillin) ,RARE Pseudomonas  06/19/2021: Strep pneumo urinary antigen>>negative 06/19/2021: Legionella urinary antigen>>negative  Antimicrobials:  Cefepime 9/20>> Vancomycin 9/20>>  Significant Hospital Events: Including procedures, antibiotic start and stop dates in addition to other pertinent events   06/19/2021: Presented to ED due to shortness of breath and altered mental status.  Found to be hypercapnic requiring mechanical ventilation via chronic tracheostomy.  PCCM asked to admit 06/21/2021: Attempting TC trials, Speech evaluation, Sputum culture with Staph Aureus and rare gram negative rods 06/22/21: Tolerating TC during day, vent at night. 06/23/2021: Continues to tolerate trach collar during the day, using vent at nighttime consistently 06/24/2021: Uneventful night.  On vent at night.  Mentating well during the day  Interim History / Subjective:  Vent at night No acute  issues Completed ABX Plan for Dc home soon   Objective   Blood pressure (!) 179/84, pulse 97, temperature 98.4 F (36.9 C), temperature source Oral, resp. rate 20, height 6' 0.01" (1.829 m), weight (!) 156.7 kg, SpO2 96 %.    Vent Mode: AC FiO2 (%):  [40 %] 40 % Set Rate:  [12 bmp] 12 bmp Vt Set:  [550 mL] 550 mL PEEP:  [5 cmH20] 5 cmH20 Plateau Pressure:  [25 cmH20] 25 cmH20   Intake/Output Summary (Last 24 hours) at 06/27/2021 0715 Last data filed at 06/27/2021 0700 Gross per 24 hour  Intake --  Output 2150 ml  Net -2150 ml    Filed Weights   06/25/21 0453 06/26/21 0500 06/27/21 0500  Weight: (!) 157.9 kg (!) 155.6 kg (!) 156.7 kg     Review of Systems: On vent Alert and awake  Physical Examination:   General Appearance: No distress  EYES PERRLA, EOM intact.   NECK Supple, No JVD s/p trach Pulmonary: normal breath sounds, No wheezing.  CardiovascularNormal S1,S2.  No m/r/g.   Abdomen: Benign, Soft, non-tender. Skin:   warm, no rashes, no ecchymosis  Extremities: normal, no cyanosis, clubbing. Neuro:without focal findings,  speech normal  PSYCHIATRIC: Mood, affect within normal limits.   ALL OTHER ROS ARE NEGATIVE    Pressure Injury 06/19/21 Buttocks Right Stage 2 -  Partial thickness loss of dermis presenting as a shallow open injury with a red, pink wound bed without slough. (Active)  06/19/21 1854  Location: Buttocks  Location Orientation: Right  Staging: Stage 2 -  Partial thickness loss of dermis presenting as a shallow open injury with a red, pink wound bed without slough.  Wound Description (Comments):   Present on Admission: Yes   Scheduled Meds:  arformoterol  15 mcg Nebulization BID   vitamin C  250 mg Oral BID   aspirin EC  81 mg Oral Daily  budesonide (PULMICORT) nebulizer solution  0.5 mg Nebulization BID   chlorhexidine gluconate (MEDLINE KIT)  15 mL Mouth Rinse BID   Chlorhexidine Gluconate Cloth  6 each Topical Q0600   cholestyramine   4 g Oral BID   enoxaparin (LOVENOX) injection  0.5 mg/kg Subcutaneous QHS   famotidine  20 mg Oral BID   fluticasone  1 spray Each Nare Daily   furosemide  40 mg Oral Daily   lidocaine  1 patch Transdermal Q24H   mouth rinse  15 mL Mouth Rinse 10 times per day   metoprolol tartrate  25 mg Oral Daily   multivitamin with minerals  1 tablet Oral Daily   mupirocin ointment   Nasal BID   predniSONE  20 mg Oral Q breakfast   Ensure Max Protein  11 oz Oral BID   revefenacin  175 mcg Nebulization Daily   sertraline  25 mg Oral Daily   Continuous Infusions:   PRN Meds:.acetaminophen, albuterol, ALPRAZolam, docusate sodium, hydrALAZINE, loperamide, ondansetron (ZOFRAN) IV, oxyCODONE, polyethylene glycol    Assessment & Plan:   Acute on Chronic Hypoxic & Hypercapnic Respiratory Failure due to MRSA & Pseudomonas Pneumonia versus left lung atelectasis, & noncompliance with home Trilogy, VENT dependent at night PMHx of Tracheostomy dependence, Obstructive Sleep Apnea, Obesity Hypoventilation syndrome, Pulmonary Hypertension Wean fio2 as tolerated Plan to d/c home with vent   Sepsis present on admission secondary to MRSA & Pseudomonas Pneumonia (Healthcare associated) Completed ABX   Chronic HFrEF without acute Exacerbation Lasix as tolerated   Acute Metabolic Encephalopathy due to Hypercapnia~resolved PMHx of Depression -UDS + for Cannabinoid   Best Practice (right click and "Reselect all SmartList Selections" daily)   Diet/type: NPO, SLP evaluation pending DVT prophylaxis: LMWH GI prophylaxis: H2B Lines: N/A Foley:  N/A Code Status:  full code  Plan for D/c Home with vent in next 24 hrs  Errik Mitchelle [name], M.D.  [facility]  Medical Director [place] Director [place] Department

## 2021-06-27 NOTE — Progress Notes (Signed)
Pt tolerating trach mask all day.   Pt up to chair w/ PT, pt returned to bed w/ 1 person assist.  Pt voiding t/o day.   Pt eating adequately.

## 2021-06-27 NOTE — Consult Note (Signed)
PHARMACY CONSULT NOTE - FOLLOW UP  Pharmacy Consult for Electrolyte Monitoring and Replacement   Recent Labs: Potassium (mmol/L)  Date Value  06/27/2021 4.2  05/04/2014 4.3   Magnesium (mg/dL)  Date Value  06/27/2021 2.2   Calcium (mg/dL)  Date Value  06/27/2021 9.3   Calcium, Total (mg/dL)  Date Value  05/04/2014 9.1   Albumin (g/dL)  Date Value  06/19/2021 4.0   Phosphorus (mg/dL)  Date Value  06/25/2021 3.1   Sodium (mmol/L)  Date Value  06/27/2021 140  05/04/2014 140     Assessment: Patient admitted with altered mental status and shortness of breath. Patient with chronic tracheostomy, requiring ventilator support initially. Pharmacy was consulted for electrolyte management.  Goal of Therapy:  Electrolytes WNL  Plan:  --No supplementation indicated at this time --Continue to follow along  Tawnya Crook, PharmD, BCPS Clinical Pharmacist 06/27/2021 9:42 AM

## 2021-06-28 LAB — CBC WITH DIFFERENTIAL/PLATELET
Abs Immature Granulocytes: 0.17 10*3/uL — ABNORMAL HIGH (ref 0.00–0.07)
Basophils Absolute: 0 10*3/uL (ref 0.0–0.1)
Basophils Relative: 0 %
Eosinophils Absolute: 0.1 10*3/uL (ref 0.0–0.5)
Eosinophils Relative: 1 %
HCT: 38.5 % — ABNORMAL LOW (ref 39.0–52.0)
Hemoglobin: 12.4 g/dL — ABNORMAL LOW (ref 13.0–17.0)
Immature Granulocytes: 2 %
Lymphocytes Relative: 15 %
Lymphs Abs: 1.7 10*3/uL (ref 0.7–4.0)
MCH: 30.9 pg (ref 26.0–34.0)
MCHC: 32.2 g/dL (ref 30.0–36.0)
MCV: 96 fL (ref 80.0–100.0)
Monocytes Absolute: 1.6 10*3/uL — ABNORMAL HIGH (ref 0.1–1.0)
Monocytes Relative: 15 %
Neutro Abs: 7.5 10*3/uL (ref 1.7–7.7)
Neutrophils Relative %: 67 %
Platelets: 168 10*3/uL (ref 150–400)
RBC: 4.01 MIL/uL — ABNORMAL LOW (ref 4.22–5.81)
RDW: 12.9 % (ref 11.5–15.5)
WBC: 11.1 10*3/uL — ABNORMAL HIGH (ref 4.0–10.5)
nRBC: 0 % (ref 0.0–0.2)

## 2021-06-28 LAB — BASIC METABOLIC PANEL
Anion gap: 9 (ref 5–15)
BUN: 26 mg/dL — ABNORMAL HIGH (ref 8–23)
CO2: 36 mmol/L — ABNORMAL HIGH (ref 22–32)
Calcium: 9.3 mg/dL (ref 8.9–10.3)
Chloride: 94 mmol/L — ABNORMAL LOW (ref 98–111)
Creatinine, Ser: 0.85 mg/dL (ref 0.61–1.24)
GFR, Estimated: >60 mL/min (ref >60)
Glucose, Bld: 144 mg/dL — ABNORMAL HIGH (ref 70–99)
Potassium: 4.3 mmol/L (ref 3.5–5.1)
Sodium: 139 mmol/L (ref 135–145)

## 2021-06-28 LAB — MAGNESIUM: Magnesium: 2.1 mg/dL (ref 1.7–2.4)

## 2021-06-28 LAB — PHOSPHORUS: Phosphorus: 5 mg/dL — ABNORMAL HIGH (ref 2.5–4.6)

## 2021-06-28 NOTE — Progress Notes (Signed)
Follow up Rash several hours later No acute issues, no spreading of rash, no fevers No acute distress  Patient is safe to go home Follow up with PCP regarding rash as needed was advised

## 2021-06-28 NOTE — Progress Notes (Signed)
NAME:  Eddie Chapman, MRN:  [id number], DOB:  03-02-1961, LOS: 9 ADMISSION DATE:  06/19/2021, CONSULTATION DATE:  06/19/2021 REFERRING MD:  [name], CHIEF COMPLAINT:  Altered Mental Status, shortness of breath   Brief Pt Description / Synopsis:  60 year old male admitted with acute metabolic encephalopathy and acute on chronic hypoxic & hypercapnic respiratory failure in setting of suspected healthcare associated pneumonia along with noncompliance with home trilogy, requiring mechanical ventilation via chronic tracheostomy.  Pertinent  Medical History  Acute on chronic hypoxic hypercapnic respiratory failure Tracheostomy dependence Obstructive sleep apnea Obesity hypoventilation syndrome Pulmonary Hypertension Hypertension  Hyperlipidemia HFrEF Pneumonia Depression GERD T8 Vertebral Fracture  Micro Data:  06/19/2021: SARS-CoV-2 and influenza PCR>> negative 06/19/2021: Blood culture>> no growth to date 06/19/2021: Urine>>no growth 06/19/2021: Sputum>>MRSA (resistance to Ciprofloxacin, Erythromycin, Oxacillin) ,RARE Pseudomonas  06/19/2021: Strep pneumo urinary antigen>>negative 06/19/2021: Legionella urinary antigen>>negative  Antimicrobials:  Cefepime 9/20>> Vancomycin 9/20>>  Significant Hospital Events: Including procedures, antibiotic start and stop dates in addition to other pertinent events   06/19/2021: Presented to ED due to shortness of breath and altered mental status.  Found to be hypercapnic requiring mechanical ventilation via chronic tracheostomy.  PCCM asked to admit 06/21/2021: Attempting TC trials, Speech evaluation, Sputum culture with Staph Aureus and rare gram negative rods 06/22/21: Tolerating TC during day, vent at night. 06/23/2021: Continues to tolerate trach collar during the day, using vent at nighttime consistently 06/24/2021: Uneventful night.  On vent at night.  Mentating well during the day  Interim History / Subjective:  Vent at night  No acute  issues Alert and awake    Objective   Blood pressure 126/86, pulse 63, temperature 98.1 F (36.7 C), temperature source Oral, resp. rate 16, height 6' 0.01" (1.829 m), weight (!) 156.5 kg, SpO2 99 %.    Vent Mode: AC FiO2 (%):  [32 %-35 %] 32 %   Intake/Output Summary (Last 24 hours) at 06/28/2021 0714 Last data filed at 06/28/2021 0600 Gross per 24 hour  Intake --  Output 2450 ml  Net -2450 ml    Filed Weights   06/26/21 0500 06/27/21 0500 06/28/21 0500  Weight: (!) 155.6 kg (!) 156.7 kg (!) 156.5 kg     Review of Systems: On vent Alert and awake  Physical Examination:   General Appearance: No distress  EYES PERRLA, EOM intact.   NECK Supple, No JVD s/p trach Pulmonary: normal breath sounds, No wheezing.  CardiovascularNormal S1,S2.  No m/r/g.   Abdomen: Benign, Soft, non-tender. Skin:   warm, no rashes, no ecchymosis  Extremities: normal, no cyanosis, clubbing. Neuro:without focal findings,  speech normal  PSYCHIATRIC: Mood, affect within normal limits.   ALL OTHER ROS ARE NEGATIVE    Pressure Injury 06/19/21 Buttocks Right Stage 2 -  Partial thickness loss of dermis presenting as a shallow open injury with a red, pink wound bed without slough. (Active)  06/19/21 1854  Location: Buttocks  Location Orientation: Right  Staging: Stage 2 -  Partial thickness loss of dermis presenting as a shallow open injury with a red, pink wound bed without slough.  Wound Description (Comments):   Present on Admission: Yes   Scheduled Meds:  arformoterol  15 mcg Nebulization BID   vitamin C  250 mg Oral BID   aspirin EC  81 mg Oral Daily   budesonide (PULMICORT) nebulizer solution  0.5 mg Nebulization BID   chlorhexidine gluconate (MEDLINE KIT)  15 mL Mouth Rinse BID   Chlorhexidine Gluconate Cloth  6  each Topical Q0600   cholestyramine  4 g Oral BID   enoxaparin (LOVENOX) injection  0.5 mg/kg Subcutaneous QHS   famotidine  20 mg Oral BID   fluticasone  1 spray Each  Nare Daily   furosemide  40 mg Oral Daily   lidocaine  1 patch Transdermal Q24H   mouth rinse  15 mL Mouth Rinse 10 times per day   metoprolol tartrate  25 mg Oral Daily   multivitamin with minerals  1 tablet Oral Daily   mupirocin ointment   Nasal BID   predniSONE  20 mg Oral Q breakfast   Ensure Max Protein  11 oz Oral BID   revefenacin  175 mcg Nebulization Daily   sertraline  25 mg Oral Daily   Continuous Infusions:   PRN Meds:.acetaminophen, albuterol, ALPRAZolam, docusate sodium, hydrALAZINE, loperamide, ondansetron (ZOFRAN) IV, oxyCODONE, polyethylene glycol    Assessment & Plan:   Acute on Chronic Hypoxic & Hypercapnic Respiratory Failure due to MRSA & Pseudomonas Pneumonia versus left lung atelectasis, & noncompliance with home Trilogy, VENT dependent at night PMHx of Tracheostomy dependence, Obstructive Sleep Apnea, Obesity Hypoventilation syndrome, Pulmonary Hypertension Wean fio2 as tolerated Plan to d/c home with vent   Sepsis present on admission secondary to MRSA & Pseudomonas Pneumonia (Healthcare associated) Completed ABX RESOLVED   Chronic HFrEF without acute Exacerbation Lasix as tolerated   Acute Metabolic Encephalopathy due to Hypercapnia~resolved PMHx of Depression -UDS + for Cannabinoid   Best Practice (right click and "Reselect all SmartList Selections" daily)   Diet/type: NPO, SLP evaluation pending DVT prophylaxis: LMWH GI prophylaxis: H2B Lines: N/A Foley:  N/A Code Status:  full code  Plan for D/c Home with vent in next 24 hrs  Jamone Garrido [name], M.D.  [facility]  Medical Director [place] Director [place] Department

## 2021-06-28 NOTE — Progress Notes (Addendum)
BRIEF CRITICAL CARE NOTE  Was called to bedside by nursing to concern of "new rash" on pt's arms, and that pt is about to be discharged home.     Rash appears to be Macular Purpuric lesions, and are contained to pt's Left lower arm/forearm and to a lesser degree to the right lower arm/forearm.  No lesions found anywhere else on the torso.  Pt DENIES ITCHING.  No change in respiratory status.  He reports the rash appeared about 1 hour ago.  No change in medications.  At 10:00 he received Vitamin C, ASA, Cholestyramine, Pepcid, Lasix, Metoprolol, Mupirocin ointment, Oxycodone, Prednisone, and Sertraline.  Patient reports he ate pancakes, eggs, and sausage for breakfast.  PLAN: Discussed with Dr. Mortimer Fries, will hold off on discharge home for now and continue to observe rash for progression.  Per Dr. Mortimer Fries, if no advancement or worsening of the rash by later this afternoon, then pt may be discharged home.      Darel Hong, AGACNP-BC [facility] Prefer epic messenger for cross cover needs If after hours, please call E-link

## 2021-06-28 NOTE — TOC Progression Note (Signed)
Transition of Care [facility]) - Progression Note    Patient Details  Name: KARTIK FERNANDO MRN: [id number] Date of Birth: 29-May-1961  Transition of Care [facility]) CM/SW [place], RN Phone Number: 06/28/2021, 10:43 AM  Clinical Narrative: Patient to discharge today, voices confidence with Trilogy care. [facility] transportation to come at noon, RN and Attending notified. [facility] to resume services RN/PT/OT. TOC barriers resolved.      Expected Discharge Plan: [place] Barriers to Discharge: Barriers Resolved  Expected Discharge Plan and Services Expected Discharge Plan: [place] In-house Referral: Clinical Social Work       Expected Discharge Date: 06/28/21               DME Arranged: Ventilator ([employer] done.) DME Agency: [employer] Date DME Agency Contacted: 06/28/21 Time DME Agency Contacted: [id number] Representative spoke with at DME Agency: [place]: RN, PT, OT [facility] Agency: [facility] Date [place]: 06/28/21 Time [place]: 1042 Representative spoke with at [place]: [place] ([place]) Interventions    Readmission Risk Interventions Readmission Risk Prevention Plan 06/21/2021  Transportation Screening Complete  PCP or Specialist Appt within 3-5 Days Complete  HRI or [place] Complete  Social Work Consult for [place] Planning/Counseling Complete  Palliative Care Screening Not Applicable  Medication Review Press photographer) Complete  Some recent data might be hidden

## 2021-06-28 NOTE — Progress Notes (Signed)
Nutrition Follow-up  DOCUMENTATION CODES:  Morbid obesity  INTERVENTION:  Ensure Max protein supplement BID, each supplement provides 150kcal and 30g of protein.  MVI po daily   Vitamin C 250mg  po BID  NUTRITION DIAGNOSIS:  Increased nutrient needs related to wound healing as evidenced by estimated needs.  GOAL:  Patient will meet greater than or equal to 90% of their needs  MONITOR:  Supplement acceptance, PO intake, Labs, Weight trends, Skin, I & O's  REASON FOR ASSESSMENT:  Low [place]    ASSESSMENT:  60 y/o male with a h/o HTN, HLD, OSA, CHF, MDD, GERD, cerebral aneurysm and pulmonary HTN who is admitted with respiratory failure secondary to ventilator non-compliance.  Pt resting in bed eating breakfast at the time of assessment. Pt has great intake of meals this admission and is routinely accepting ensure max. No complaints reported today. Is ready to go home.  Discussed in rounds, pt is stable to be discharged, home health educated pt and caregiver again on the use of trilogy machine at home. Will be discharged later today.   Intake/Output Summary (Last 24 hours) at 06/28/2021 1034 Last data filed at 06/28/2021 0600 Gross per 24 hour  Intake --  Output 2450 ml  Net -2450 ml  Net IO Since Admission: -8,109.84 mL [06/28/21 1034]  Average Meal Intake: 9/25-9/29: 100% intake x 4 recorded meals  Nutritionally Relevant Medications: Scheduled Meds:  vitamin C  250 mg Oral BID   cholestyramine  4 g Oral BID   famotidine  20 mg Oral BID   furosemide  40 mg Oral Daily   multivitamin with minerals  1 tablet Oral Daily   predniSONE  20 mg Oral Q breakfast   Ensure Max Protein  11 oz Oral BID   PRN Meds: docusate sodium, loperamide, ondansetron, polyethylene glycol  Labs Reviewed: BUN 26 Phosphorus 5 SBG ranges from 139-144 mg/dL over the last 24 hours HgbA1c 5.6% (7/31)  NUTRITION - FOCUSED PHYSICAL EXAM: Flowsheet Row Most Recent Value  Orbital Region No  depletion  Upper Arm Region No depletion  Thoracic and Lumbar Region No depletion  Buccal Region No depletion  Temple Region No depletion  Clavicle Bone Region No depletion  Clavicle and Acromion Bone Region No depletion  Scapular Bone Region No depletion  Dorsal Hand No depletion  Patellar Region No depletion  Anterior Thigh Region No depletion  Posterior Calf Region No depletion  Edema (RD Assessment) Moderate  Hair Reviewed  Eyes Reviewed  Mouth Reviewed  Skin Reviewed  Nails Reviewed   Diet Order:   Diet Order             Diet heart healthy/carb modified Room service appropriate? Yes with Assist; Fluid consistency: Thin  Diet effective now                  EDUCATION NEEDS:  No education needs have been identified at this time  Skin:  Skin Assessment: Reviewed RN Assessment (Stage II buttocks)  Last BM:  9/28  Height:  Ht Readings from Last 1 Encounters:  06/26/21 6' 0.01" (1.829 m)   Weight:  Wt Readings from Last 1 Encounters:  06/28/21 (!) 156.5 kg    Ideal Body Weight:  80.9 kg  BMI:  Body mass index is 46.78 kg/m.  Estimated Nutritional Needs:  Kcal:  2500-2700 kcal/d Protein:  125-140g/d Fluid:  2.5-2.8L/day   Ranell Patrick, RD, LDN Clinical Dietitian Pager on [place]

## 2021-06-28 NOTE — Consult Note (Signed)
PHARMACY CONSULT NOTE - FOLLOW UP  Pharmacy Consult for Electrolyte Monitoring and Replacement   Recent Labs: Potassium (mmol/L)  Date Value  06/28/2021 4.3  05/04/2014 4.3   Magnesium (mg/dL)  Date Value  06/28/2021 2.1   Calcium (mg/dL)  Date Value  06/28/2021 9.3   Calcium, Total (mg/dL)  Date Value  05/04/2014 9.1   Albumin (g/dL)  Date Value  06/19/2021 4.0   Phosphorus (mg/dL)  Date Value  06/28/2021 5.0 (H)   Sodium (mmol/L)  Date Value  06/28/2021 139  05/04/2014 140     Assessment: Patient admitted with altered mental status and shortness of breath. Patient with chronic tracheostomy, requiring ventilator support initially. Pharmacy was consulted for electrolyte management.  Goal of Therapy:  Electrolytes WNL  Plan:  --No supplementation indicated at this time --Patient to discharge today  Tawnya Crook, PharmD, BCPS Clinical Pharmacist 06/28/2021 10:26 AM

## 2021-06-28 NOTE — Discharge Summary (Addendum)
Physician Discharge Summary  [name] MRN: [id number] DOB/AGE: 61-11-1960 61 y.o.  Admit date: 06/19/2021 Discharge date: 06/28/2021  Admission [place] ACUTE DIASTOLIC HEART FAILURE +DRUG ABUSE WITH CANNABINOIDS  Discharge Diagnoses:  Active Problems:   Sepsis ([place])   Acute metabolic encephalopathy   Chronic HFrEF (heart failure with reduced ejection fraction) (HCC)   Acute on chronic respiratory failure with hypercapnia (HCC)   Pressure injury of skin   T2DM (type 2 diabetes mellitus) ([place])   Discharged Condition: stable  Hospital Course:  60 year old male admitted with acute metabolic encephalopathy and acute on chronic hypoxic & hypercapnic respiratory failure in setting of suspected healthcare associated pneumonia along with noncompliance with home trilogy, requiring mechanical ventilation via chronic tracheostomy.   Pertinent  Medical History  Acute on chronic hypoxic hypercapnic respiratory failure Tracheostomy dependence Obstructive sleep apnea Obesity hypoventilation syndrome Pulmonary Hypertension Hypertension  Hyperlipidemia HFrEF Pneumonia Depression GERD T8 Vertebral Fracture   Micro Data:  06/19/2021: SARS-CoV-2 and influenza PCR>> negative 06/19/2021: Blood culture>> no growth to date 06/19/2021: Urine>>no growth 06/19/2021: Sputum>>MRSA (resistance to Ciprofloxacin, Erythromycin, Oxacillin) ,RARE Pseudomonas  06/19/2021: Strep pneumo urinary antigen>>negative 06/19/2021: Legionella urinary antigen>>negative   Antimicrobials:  Cefepime 9/20>> Vancomycin 9/20>>   Significant Hospital Events: Including procedures, antibiotic start and stop dates in addition to other pertinent events   06/19/2021: Presented to ED due to shortness of breath and altered mental status.  Found to be hypercapnic requiring mechanical ventilation via chronic tracheostomy.  PCCM asked to admit 06/21/2021: Attempting TC trials, Speech  evaluation, Sputum culture with Staph Aureus and rare gram negative rods 06/22/21: Tolerating TC during day, vent at night. 06/23/2021: Continues to tolerate trach collar during the day, using vent at nighttime consistently 06/24/2021: Uneventful night.  On vent at night.  Mentating well during the day   Sepsis present on admission secondary to MRSA & Pseudomonas Pneumonia (Healthcare associated) Completed ABX  Chronic HFrEF without acute Exacerbation Lasix as tolerated     Acute Metabolic Encephalopathy due to Hypercapnia~resolved PMHx of Depression -UDS + for Cannabinoid      9/27 [employer] company assisted with teaching patient and family Patient is requesting to be discharged home today    Discharge Exam: Blood pressure 133/82, pulse (!) 107, temperature 98.1 F (36.7 C), temperature source Oral, resp. rate 19, height 6' 0.01" (1.829 m), weight (!) 156.5 kg, SpO2 98 %. Physical Examination:   General Appearance: No distress  EYES PERRLA, EOM intact.   NECK Supple, No JVD s/p [facility] Pulmonary: normal breath sounds, No wheezing.  CardiovascularNormal S1,S2.  No m/r/g.   Abdomen: Benign, Soft, non-tender. Skin:   warm, no rashes, no ecchymosis  Extremities: normal, no cyanosis, clubbing. Neuro:without focal findings,  speech normal  PSYCHIATRIC: Mood, affect within normal limits.   ALL OTHER ROS ARE NEGATIVE  Disposition: HOME WITH TRILOGY VENT FOLLOW UP WITH DR ALESKEROV in 1-2 weeks   Allergies as of 06/28/2021       Reactions   Amlodipine Swelling   Ankle swelling    Divalproex Sodium Other (See Comments)   Elevated liver enzymes        Medication List     STOP taking these medications    azithromycin 250 MG tablet Commonly known as: ZITHROMAX       TAKE these medications    albuterol 108 (90 Base) MCG/ACT inhaler Commonly known as: VENTOLIN HFA Inhale 2 puffs into the lungs every 6 (six) hours as needed for wheezing  or shortness of breath.    aspirin EC 81 MG tablet Take 81 mg by mouth daily.   cholecalciferol 25 MCG (1000 UNIT) tablet Commonly known as: VITAMIN D3 Take 4,000 Units by mouth daily.   famotidine 20 MG tablet Commonly known as: PEPCID Take 1 tablet (20 mg total) by mouth 2 (two) times daily.   furosemide 40 MG tablet Commonly known as: LASIX Take 1 tablet (40 mg total) by mouth daily.   glycopyrrolate 1 MG tablet Commonly known as: ROBINUL Take 0.5 mg by mouth 3 (three) times daily.   ipratropium-albuterol 0.5-2.5 (3) MG/3ML Soln Commonly known as: DUONEB Take 3 mLs by nebulization every 4 (four) hours as needed.   metoprolol tartrate 25 MG tablet Commonly known as: LOPRESSOR Take 1 tablet (25 mg total) by mouth daily.   sertraline 25 MG tablet Commonly known as: ZOLOFT Take 1 tablet (25 mg total) by mouth daily.         Signed: Flora Lipps 06/28/2021, 10:21 AM

## 2021-06-30 DIAGNOSIS — I272 Pulmonary hypertension, unspecified: Secondary | ICD-10-CM | POA: Diagnosis not present

## 2021-06-30 DIAGNOSIS — I5022 Chronic systolic (congestive) heart failure: Secondary | ICD-10-CM | POA: Diagnosis not present

## 2021-07-02 DIAGNOSIS — J9611 Chronic respiratory failure with hypoxia: Secondary | ICD-10-CM | POA: Diagnosis not present

## 2021-07-02 DIAGNOSIS — Z930 Tracheostomy status: Secondary | ICD-10-CM | POA: Diagnosis not present

## 2021-07-02 DIAGNOSIS — Z9981 Dependence on supplemental oxygen: Secondary | ICD-10-CM | POA: Diagnosis not present

## 2021-07-02 DIAGNOSIS — J441 Chronic obstructive pulmonary disease with (acute) exacerbation: Secondary | ICD-10-CM | POA: Diagnosis not present

## 2021-07-02 DIAGNOSIS — M6281 Muscle weakness (generalized): Secondary | ICD-10-CM | POA: Diagnosis not present

## 2021-07-02 DIAGNOSIS — E662 Morbid (severe) obesity with alveolar hypoventilation: Secondary | ICD-10-CM | POA: Diagnosis not present

## 2021-07-02 DIAGNOSIS — I5022 Chronic systolic (congestive) heart failure: Secondary | ICD-10-CM | POA: Diagnosis not present

## 2021-07-02 DIAGNOSIS — Z6841 Body Mass Index (BMI) 40.0 and over, adult: Secondary | ICD-10-CM | POA: Diagnosis not present

## 2021-07-02 DIAGNOSIS — J9622 Acute and chronic respiratory failure with hypercapnia: Secondary | ICD-10-CM | POA: Diagnosis not present

## 2021-07-02 DIAGNOSIS — Z430 Encounter for attention to tracheostomy: Secondary | ICD-10-CM | POA: Diagnosis not present

## 2021-07-04 DIAGNOSIS — D696 Thrombocytopenia, unspecified: Secondary | ICD-10-CM | POA: Diagnosis not present

## 2021-07-04 DIAGNOSIS — I739 Peripheral vascular disease, unspecified: Secondary | ICD-10-CM | POA: Diagnosis not present

## 2021-07-04 DIAGNOSIS — Z930 Tracheostomy status: Secondary | ICD-10-CM | POA: Diagnosis not present

## 2021-07-04 DIAGNOSIS — R279 Unspecified lack of coordination: Secondary | ICD-10-CM | POA: Diagnosis not present

## 2021-07-04 DIAGNOSIS — D582 Other hemoglobinopathies: Secondary | ICD-10-CM | POA: Diagnosis not present

## 2021-07-04 DIAGNOSIS — J9611 Chronic respiratory failure with hypoxia: Secondary | ICD-10-CM | POA: Diagnosis not present

## 2021-07-04 DIAGNOSIS — R21 Rash and other nonspecific skin eruption: Secondary | ICD-10-CM | POA: Diagnosis not present

## 2021-07-04 DIAGNOSIS — R5381 Other malaise: Secondary | ICD-10-CM | POA: Diagnosis not present

## 2021-07-04 DIAGNOSIS — Z743 Need for continuous supervision: Secondary | ICD-10-CM | POA: Diagnosis not present

## 2021-07-04 DIAGNOSIS — I5022 Chronic systolic (congestive) heart failure: Secondary | ICD-10-CM | POA: Diagnosis not present

## 2021-07-04 DIAGNOSIS — G4733 Obstructive sleep apnea (adult) (pediatric): Secondary | ICD-10-CM | POA: Diagnosis not present

## 2021-07-04 DIAGNOSIS — I10 Essential (primary) hypertension: Secondary | ICD-10-CM | POA: Diagnosis not present

## 2021-07-04 DIAGNOSIS — J9612 Chronic respiratory failure with hypercapnia: Secondary | ICD-10-CM | POA: Diagnosis not present

## 2021-07-04 DIAGNOSIS — I272 Pulmonary hypertension, unspecified: Secondary | ICD-10-CM | POA: Diagnosis not present

## 2021-07-05 DIAGNOSIS — R5381 Other malaise: Secondary | ICD-10-CM | POA: Diagnosis not present

## 2021-07-05 DIAGNOSIS — Z430 Encounter for attention to tracheostomy: Secondary | ICD-10-CM | POA: Diagnosis not present

## 2021-07-05 DIAGNOSIS — Z930 Tracheostomy status: Secondary | ICD-10-CM | POA: Diagnosis not present

## 2021-07-05 DIAGNOSIS — Z7401 Bed confinement status: Secondary | ICD-10-CM | POA: Diagnosis not present

## 2021-07-05 DIAGNOSIS — R531 Weakness: Secondary | ICD-10-CM | POA: Diagnosis not present

## 2021-07-05 DIAGNOSIS — H903 Sensorineural hearing loss, bilateral: Secondary | ICD-10-CM | POA: Diagnosis not present

## 2021-07-10 LAB — BLOOD GAS, VENOUS
Acid-Base Excess: 15.8 mmol/L — ABNORMAL HIGH (ref 0.0–2.0)
Bicarbonate: 47.6 mmol/L — ABNORMAL HIGH (ref 20.0–28.0)
O2 Saturation: 30.1 %
Patient temperature: 37
pCO2, Ven: 99 mmHg (ref 44.0–60.0)
pH, Ven: 7.29 (ref 7.250–7.430)

## 2021-07-13 DIAGNOSIS — Z7401 Bed confinement status: Secondary | ICD-10-CM | POA: Diagnosis not present

## 2021-07-13 DIAGNOSIS — A46 Erysipelas: Secondary | ICD-10-CM | POA: Diagnosis not present

## 2021-07-13 DIAGNOSIS — R0902 Hypoxemia: Secondary | ICD-10-CM | POA: Diagnosis not present

## 2021-07-13 DIAGNOSIS — R000 Tachycardia, unspecified: Secondary | ICD-10-CM | POA: Diagnosis not present

## 2021-07-13 DIAGNOSIS — I10 Essential (primary) hypertension: Secondary | ICD-10-CM | POA: Diagnosis not present

## 2021-07-21 DIAGNOSIS — Z930 Tracheostomy status: Secondary | ICD-10-CM | POA: Diagnosis not present

## 2021-07-21 DIAGNOSIS — E662 Morbid (severe) obesity with alveolar hypoventilation: Secondary | ICD-10-CM | POA: Diagnosis not present

## 2021-07-21 DIAGNOSIS — J9621 Acute and chronic respiratory failure with hypoxia: Secondary | ICD-10-CM | POA: Diagnosis not present

## 2021-07-21 DIAGNOSIS — G4733 Obstructive sleep apnea (adult) (pediatric): Secondary | ICD-10-CM | POA: Diagnosis not present

## 2021-07-23 DIAGNOSIS — D692 Other nonthrombocytopenic purpura: Secondary | ICD-10-CM | POA: Diagnosis not present

## 2021-07-23 DIAGNOSIS — Z9981 Dependence on supplemental oxygen: Secondary | ICD-10-CM | POA: Diagnosis not present

## 2021-07-23 DIAGNOSIS — J449 Chronic obstructive pulmonary disease, unspecified: Secondary | ICD-10-CM | POA: Diagnosis not present

## 2021-07-23 DIAGNOSIS — R21 Rash and other nonspecific skin eruption: Secondary | ICD-10-CM | POA: Diagnosis not present

## 2021-07-23 DIAGNOSIS — I739 Peripheral vascular disease, unspecified: Secondary | ICD-10-CM | POA: Diagnosis not present

## 2021-07-23 DIAGNOSIS — I10 Essential (primary) hypertension: Secondary | ICD-10-CM | POA: Diagnosis not present

## 2021-07-23 DIAGNOSIS — Z7982 Long term (current) use of aspirin: Secondary | ICD-10-CM | POA: Diagnosis not present

## 2021-07-23 DIAGNOSIS — Z7951 Long term (current) use of inhaled steroids: Secondary | ICD-10-CM | POA: Diagnosis not present

## 2021-07-23 DIAGNOSIS — Z6841 Body Mass Index (BMI) 40.0 and over, adult: Secondary | ICD-10-CM | POA: Diagnosis not present

## 2021-07-23 DIAGNOSIS — J9610 Chronic respiratory failure, unspecified whether with hypoxia or hypercapnia: Secondary | ICD-10-CM | POA: Diagnosis not present

## 2021-07-23 DIAGNOSIS — Z930 Tracheostomy status: Secondary | ICD-10-CM | POA: Diagnosis not present

## 2021-07-23 DIAGNOSIS — E662 Morbid (severe) obesity with alveolar hypoventilation: Secondary | ICD-10-CM | POA: Diagnosis not present

## 2021-07-31 DIAGNOSIS — I5022 Chronic systolic (congestive) heart failure: Secondary | ICD-10-CM | POA: Diagnosis not present

## 2021-07-31 DIAGNOSIS — I272 Pulmonary hypertension, unspecified: Secondary | ICD-10-CM | POA: Diagnosis not present

## 2021-08-01 DIAGNOSIS — J9622 Acute and chronic respiratory failure with hypercapnia: Secondary | ICD-10-CM | POA: Diagnosis not present

## 2021-08-01 DIAGNOSIS — F1210 Cannabis abuse, uncomplicated: Secondary | ICD-10-CM | POA: Diagnosis not present

## 2021-08-01 DIAGNOSIS — Z6841 Body Mass Index (BMI) 40.0 and over, adult: Secondary | ICD-10-CM | POA: Diagnosis not present

## 2021-08-01 DIAGNOSIS — J441 Chronic obstructive pulmonary disease with (acute) exacerbation: Secondary | ICD-10-CM | POA: Diagnosis not present

## 2021-08-01 DIAGNOSIS — J9611 Chronic respiratory failure with hypoxia: Secondary | ICD-10-CM | POA: Diagnosis not present

## 2021-08-01 DIAGNOSIS — G9341 Metabolic encephalopathy: Secondary | ICD-10-CM | POA: Diagnosis not present

## 2021-08-01 DIAGNOSIS — M6281 Muscle weakness (generalized): Secondary | ICD-10-CM | POA: Diagnosis not present

## 2021-08-01 DIAGNOSIS — E662 Morbid (severe) obesity with alveolar hypoventilation: Secondary | ICD-10-CM | POA: Diagnosis not present

## 2021-08-01 DIAGNOSIS — I5022 Chronic systolic (congestive) heart failure: Secondary | ICD-10-CM | POA: Diagnosis not present

## 2021-08-01 DIAGNOSIS — E119 Type 2 diabetes mellitus without complications: Secondary | ICD-10-CM | POA: Diagnosis not present

## 2021-08-02 ENCOUNTER — Other Ambulatory Visit: Payer: Self-pay

## 2021-08-02 ENCOUNTER — Emergency Department
Admission: EM | Admit: 2021-08-02 | Discharge: 2021-08-02 | Disposition: A | Discharge status: Home / Self Care | Payer: PPO | Attending: Emergency Medicine | Admitting: Emergency Medicine

## 2021-08-02 DIAGNOSIS — X088XXA Exposure to other specified smoke, fire and flames, initial encounter: Secondary | ICD-10-CM | POA: Insufficient documentation

## 2021-08-02 DIAGNOSIS — Z743 Need for continuous supervision: Secondary | ICD-10-CM | POA: Diagnosis not present

## 2021-08-02 DIAGNOSIS — I5031 Acute diastolic (congestive) heart failure: Secondary | ICD-10-CM | POA: Diagnosis not present

## 2021-08-02 DIAGNOSIS — E119 Type 2 diabetes mellitus without complications: Secondary | ICD-10-CM | POA: Insufficient documentation

## 2021-08-02 DIAGNOSIS — T2002XA Burn of unspecified degree of lip(s), initial encounter: Secondary | ICD-10-CM | POA: Insufficient documentation

## 2021-08-02 DIAGNOSIS — T2006XA Burn of unspecified degree of forehead and cheek, initial encounter: Secondary | ICD-10-CM | POA: Diagnosis not present

## 2021-08-02 DIAGNOSIS — T310 Burns involving less than 10% of body surface: Secondary | ICD-10-CM | POA: Diagnosis not present

## 2021-08-02 DIAGNOSIS — Z7982 Long term (current) use of aspirin: Secondary | ICD-10-CM | POA: Insufficient documentation

## 2021-08-02 DIAGNOSIS — I110 Hypertensive heart disease with heart failure: Secondary | ICD-10-CM | POA: Insufficient documentation

## 2021-08-02 DIAGNOSIS — T300 Burn of unspecified body region, unspecified degree: Secondary | ICD-10-CM | POA: Diagnosis not present

## 2021-08-02 DIAGNOSIS — R21 Rash and other nonspecific skin eruption: Secondary | ICD-10-CM | POA: Diagnosis not present

## 2021-08-02 DIAGNOSIS — R279 Unspecified lack of coordination: Secondary | ICD-10-CM | POA: Diagnosis not present

## 2021-08-02 DIAGNOSIS — Z79899 Other long term (current) drug therapy: Secondary | ICD-10-CM | POA: Insufficient documentation

## 2021-08-02 NOTE — ED Notes (Signed)
Non emergent ambulance transport request placed for pt. To go home.

## 2021-08-02 NOTE — ED Notes (Signed)
Called [facility] to transport patient back to his home @ 7:54pm
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
## 2021-08-02 NOTE — Discharge Instructions (Signed)
Please seek medical attention for any high fevers, chest pain, shortness of breath, change in behavior, persistent vomiting, bloody stool or any other new or concerning symptoms.  
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
## 2021-08-03 DIAGNOSIS — R739 Hyperglycemia, unspecified: Secondary | ICD-10-CM | POA: Diagnosis not present

## 2021-08-03 DIAGNOSIS — R3589 Other polyuria: Secondary | ICD-10-CM | POA: Diagnosis not present

## 2021-08-03 DIAGNOSIS — J9811 Atelectasis: Secondary | ICD-10-CM | POA: Diagnosis not present

## 2021-08-03 DIAGNOSIS — T2004XA Burn of unspecified degree of nose (septum), initial encounter: Secondary | ICD-10-CM | POA: Diagnosis not present

## 2021-08-03 DIAGNOSIS — Z888 Allergy status to other drugs, medicaments and biological substances status: Secondary | ICD-10-CM | POA: Diagnosis not present

## 2021-08-03 DIAGNOSIS — Z7982 Long term (current) use of aspirin: Secondary | ICD-10-CM | POA: Diagnosis not present

## 2021-08-03 DIAGNOSIS — R000 Tachycardia, unspecified: Secondary | ICD-10-CM | POA: Diagnosis not present

## 2021-08-03 DIAGNOSIS — T2002XA Burn of unspecified degree of lip(s), initial encounter: Secondary | ICD-10-CM | POA: Diagnosis not present

## 2021-08-03 DIAGNOSIS — Z20822 Contact with and (suspected) exposure to covid-19: Secondary | ICD-10-CM | POA: Diagnosis not present

## 2021-08-03 DIAGNOSIS — R0602 Shortness of breath: Secondary | ICD-10-CM | POA: Diagnosis not present

## 2021-08-03 DIAGNOSIS — Z9181 History of falling: Secondary | ICD-10-CM | POA: Diagnosis not present

## 2021-08-03 DIAGNOSIS — I5022 Chronic systolic (congestive) heart failure: Secondary | ICD-10-CM | POA: Diagnosis not present

## 2021-08-03 DIAGNOSIS — I671 Cerebral aneurysm, nonruptured: Secondary | ICD-10-CM | POA: Diagnosis not present

## 2021-08-03 DIAGNOSIS — T2006XA Burn of unspecified degree of forehead and cheek, initial encounter: Secondary | ICD-10-CM | POA: Diagnosis not present

## 2021-08-03 DIAGNOSIS — E1165 Type 2 diabetes mellitus with hyperglycemia: Secondary | ICD-10-CM | POA: Diagnosis not present

## 2021-08-03 DIAGNOSIS — T300 Burn of unspecified body region, unspecified degree: Secondary | ICD-10-CM | POA: Diagnosis not present

## 2021-08-03 DIAGNOSIS — T310 Burns involving less than 10% of body surface: Secondary | ICD-10-CM | POA: Diagnosis not present

## 2021-08-03 DIAGNOSIS — Z930 Tracheostomy status: Secondary | ICD-10-CM | POA: Diagnosis not present

## 2021-08-03 DIAGNOSIS — T2029XA Burn of second degree of multiple sites of head, face, and neck, initial encounter: Secondary | ICD-10-CM | POA: Diagnosis not present

## 2021-08-03 DIAGNOSIS — R0902 Hypoxemia: Secondary | ICD-10-CM | POA: Diagnosis not present

## 2021-08-03 DIAGNOSIS — J90 Pleural effusion, not elsewhere classified: Secondary | ICD-10-CM | POA: Diagnosis not present

## 2021-08-03 DIAGNOSIS — Z794 Long term (current) use of insulin: Secondary | ICD-10-CM | POA: Diagnosis not present

## 2021-08-03 DIAGNOSIS — R0689 Other abnormalities of breathing: Secondary | ICD-10-CM | POA: Diagnosis not present

## 2021-08-03 DIAGNOSIS — Z87891 Personal history of nicotine dependence: Secondary | ICD-10-CM | POA: Diagnosis not present

## 2021-08-04 DIAGNOSIS — G4733 Obstructive sleep apnea (adult) (pediatric): Secondary | ICD-10-CM | POA: Diagnosis not present

## 2021-08-06 DIAGNOSIS — R531 Weakness: Secondary | ICD-10-CM | POA: Diagnosis not present

## 2021-08-06 DIAGNOSIS — Z930 Tracheostomy status: Secondary | ICD-10-CM | POA: Diagnosis not present

## 2021-08-06 DIAGNOSIS — M6250 Muscle wasting and atrophy, not elsewhere classified, unspecified site: Secondary | ICD-10-CM | POA: Diagnosis not present

## 2021-08-08 DIAGNOSIS — T2002XD Burn of unspecified degree of lip(s), subsequent encounter: Secondary | ICD-10-CM | POA: Diagnosis not present

## 2021-08-08 DIAGNOSIS — Z794 Long term (current) use of insulin: Secondary | ICD-10-CM | POA: Diagnosis not present

## 2021-08-08 DIAGNOSIS — J449 Chronic obstructive pulmonary disease, unspecified: Secondary | ICD-10-CM | POA: Diagnosis not present

## 2021-08-08 DIAGNOSIS — E1165 Type 2 diabetes mellitus with hyperglycemia: Secondary | ICD-10-CM | POA: Diagnosis not present

## 2021-08-08 DIAGNOSIS — J9610 Chronic respiratory failure, unspecified whether with hypoxia or hypercapnia: Secondary | ICD-10-CM | POA: Diagnosis not present

## 2021-08-08 DIAGNOSIS — T2006XD Burn of unspecified degree of forehead and cheek, subsequent encounter: Secondary | ICD-10-CM | POA: Diagnosis not present

## 2021-08-08 DIAGNOSIS — I10 Essential (primary) hypertension: Secondary | ICD-10-CM | POA: Diagnosis not present

## 2021-08-08 DIAGNOSIS — T2004XD Burn of unspecified degree of nose (septum), subsequent encounter: Secondary | ICD-10-CM | POA: Diagnosis not present

## 2021-08-08 DIAGNOSIS — Z6841 Body Mass Index (BMI) 40.0 and over, adult: Secondary | ICD-10-CM | POA: Diagnosis not present

## 2021-08-08 DIAGNOSIS — T310 Burns involving less than 10% of body surface: Secondary | ICD-10-CM | POA: Diagnosis not present

## 2021-08-08 DIAGNOSIS — E1169 Type 2 diabetes mellitus with other specified complication: Secondary | ICD-10-CM | POA: Diagnosis not present

## 2021-08-08 DIAGNOSIS — E662 Morbid (severe) obesity with alveolar hypoventilation: Secondary | ICD-10-CM | POA: Diagnosis not present

## 2021-08-10 DIAGNOSIS — M6281 Muscle weakness (generalized): Secondary | ICD-10-CM | POA: Diagnosis not present

## 2021-08-10 DIAGNOSIS — J9611 Chronic respiratory failure with hypoxia: Secondary | ICD-10-CM | POA: Diagnosis not present

## 2021-08-10 DIAGNOSIS — I5022 Chronic systolic (congestive) heart failure: Secondary | ICD-10-CM | POA: Diagnosis not present

## 2021-08-10 DIAGNOSIS — E119 Type 2 diabetes mellitus without complications: Secondary | ICD-10-CM | POA: Diagnosis not present

## 2021-08-10 DIAGNOSIS — G9341 Metabolic encephalopathy: Secondary | ICD-10-CM | POA: Diagnosis not present

## 2021-08-10 DIAGNOSIS — J9622 Acute and chronic respiratory failure with hypercapnia: Secondary | ICD-10-CM | POA: Diagnosis not present

## 2021-08-10 DIAGNOSIS — J441 Chronic obstructive pulmonary disease with (acute) exacerbation: Secondary | ICD-10-CM | POA: Diagnosis not present

## 2021-08-12 DIAGNOSIS — E1165 Type 2 diabetes mellitus with hyperglycemia: Secondary | ICD-10-CM | POA: Diagnosis not present

## 2021-08-12 DIAGNOSIS — Z7985 Long-term (current) use of injectable non-insulin antidiabetic drugs: Secondary | ICD-10-CM | POA: Diagnosis not present

## 2021-08-12 DIAGNOSIS — Z7984 Long term (current) use of oral hypoglycemic drugs: Secondary | ICD-10-CM | POA: Diagnosis not present

## 2021-08-12 DIAGNOSIS — Z794 Long term (current) use of insulin: Secondary | ICD-10-CM | POA: Diagnosis not present

## 2021-08-14 ENCOUNTER — Ambulatory Visit: Payer: PPO | Admitting: Infectious Diseases

## 2021-08-15 DIAGNOSIS — I10 Essential (primary) hypertension: Secondary | ICD-10-CM | POA: Diagnosis not present

## 2021-08-15 DIAGNOSIS — R059 Cough, unspecified: Secondary | ICD-10-CM | POA: Diagnosis not present

## 2021-08-15 DIAGNOSIS — Z930 Tracheostomy status: Secondary | ICD-10-CM | POA: Diagnosis not present

## 2021-08-16 DIAGNOSIS — Z7985 Long-term (current) use of injectable non-insulin antidiabetic drugs: Secondary | ICD-10-CM | POA: Diagnosis not present

## 2021-08-16 DIAGNOSIS — Z7984 Long term (current) use of oral hypoglycemic drugs: Secondary | ICD-10-CM | POA: Diagnosis not present

## 2021-08-16 DIAGNOSIS — Z794 Long term (current) use of insulin: Secondary | ICD-10-CM | POA: Diagnosis not present

## 2021-08-16 DIAGNOSIS — E1165 Type 2 diabetes mellitus with hyperglycemia: Secondary | ICD-10-CM | POA: Diagnosis not present

## 2021-08-21 DIAGNOSIS — J9621 Acute and chronic respiratory failure with hypoxia: Secondary | ICD-10-CM | POA: Diagnosis not present

## 2021-08-21 DIAGNOSIS — E662 Morbid (severe) obesity with alveolar hypoventilation: Secondary | ICD-10-CM | POA: Diagnosis not present

## 2021-08-21 DIAGNOSIS — Z930 Tracheostomy status: Secondary | ICD-10-CM | POA: Diagnosis not present

## 2021-08-21 DIAGNOSIS — G4733 Obstructive sleep apnea (adult) (pediatric): Secondary | ICD-10-CM | POA: Diagnosis not present

## 2021-08-29 DIAGNOSIS — R000 Tachycardia, unspecified: Secondary | ICD-10-CM | POA: Diagnosis not present

## 2021-08-29 DIAGNOSIS — I5022 Chronic systolic (congestive) heart failure: Secondary | ICD-10-CM | POA: Diagnosis not present

## 2021-08-29 DIAGNOSIS — I272 Pulmonary hypertension, unspecified: Secondary | ICD-10-CM | POA: Diagnosis not present

## 2021-08-29 DIAGNOSIS — R5381 Other malaise: Secondary | ICD-10-CM | POA: Diagnosis not present

## 2021-08-29 DIAGNOSIS — D696 Thrombocytopenia, unspecified: Secondary | ICD-10-CM | POA: Diagnosis not present

## 2021-08-29 DIAGNOSIS — E559 Vitamin D deficiency, unspecified: Secondary | ICD-10-CM | POA: Diagnosis not present

## 2021-08-29 DIAGNOSIS — E785 Hyperlipidemia, unspecified: Secondary | ICD-10-CM | POA: Diagnosis not present

## 2021-08-29 DIAGNOSIS — Z930 Tracheostomy status: Secondary | ICD-10-CM | POA: Diagnosis not present

## 2021-08-29 DIAGNOSIS — Z743 Need for continuous supervision: Secondary | ICD-10-CM | POA: Diagnosis not present

## 2021-08-29 DIAGNOSIS — J9611 Chronic respiratory failure with hypoxia: Secondary | ICD-10-CM | POA: Diagnosis not present

## 2021-08-29 DIAGNOSIS — I739 Peripheral vascular disease, unspecified: Secondary | ICD-10-CM | POA: Diagnosis not present

## 2021-08-29 DIAGNOSIS — J9612 Chronic respiratory failure with hypercapnia: Secondary | ICD-10-CM | POA: Diagnosis not present

## 2021-08-29 DIAGNOSIS — R531 Weakness: Secondary | ICD-10-CM | POA: Diagnosis not present

## 2021-08-29 DIAGNOSIS — I10 Essential (primary) hypertension: Secondary | ICD-10-CM | POA: Diagnosis not present

## 2021-08-29 DIAGNOSIS — E349 Endocrine disorder, unspecified: Secondary | ICD-10-CM | POA: Diagnosis not present

## 2021-08-30 DIAGNOSIS — E119 Type 2 diabetes mellitus without complications: Secondary | ICD-10-CM | POA: Diagnosis not present

## 2021-08-30 DIAGNOSIS — J441 Chronic obstructive pulmonary disease with (acute) exacerbation: Secondary | ICD-10-CM | POA: Diagnosis not present

## 2021-08-30 DIAGNOSIS — F1210 Cannabis abuse, uncomplicated: Secondary | ICD-10-CM | POA: Diagnosis not present

## 2021-08-30 DIAGNOSIS — E662 Morbid (severe) obesity with alveolar hypoventilation: Secondary | ICD-10-CM | POA: Diagnosis not present

## 2021-08-30 DIAGNOSIS — M6281 Muscle weakness (generalized): Secondary | ICD-10-CM | POA: Diagnosis not present

## 2021-08-30 DIAGNOSIS — Z6841 Body Mass Index (BMI) 40.0 and over, adult: Secondary | ICD-10-CM | POA: Diagnosis not present

## 2021-08-30 DIAGNOSIS — G9341 Metabolic encephalopathy: Secondary | ICD-10-CM | POA: Diagnosis not present

## 2021-08-30 DIAGNOSIS — J9611 Chronic respiratory failure with hypoxia: Secondary | ICD-10-CM | POA: Diagnosis not present

## 2021-08-30 DIAGNOSIS — I5022 Chronic systolic (congestive) heart failure: Secondary | ICD-10-CM | POA: Diagnosis not present

## 2021-08-30 DIAGNOSIS — I272 Pulmonary hypertension, unspecified: Secondary | ICD-10-CM | POA: Diagnosis not present

## 2021-08-30 DIAGNOSIS — J9622 Acute and chronic respiratory failure with hypercapnia: Secondary | ICD-10-CM | POA: Diagnosis not present

## 2021-08-31 DIAGNOSIS — Z930 Tracheostomy status: Secondary | ICD-10-CM | POA: Diagnosis not present

## 2021-09-03 DIAGNOSIS — G4733 Obstructive sleep apnea (adult) (pediatric): Secondary | ICD-10-CM | POA: Diagnosis not present

## 2021-09-11 DIAGNOSIS — R82998 Other abnormal findings in urine: Secondary | ICD-10-CM | POA: Diagnosis not present

## 2021-09-11 DIAGNOSIS — Z930 Tracheostomy status: Secondary | ICD-10-CM | POA: Diagnosis not present

## 2021-09-12 ENCOUNTER — Other Ambulatory Visit: Payer: Self-pay | Admitting: Internal Medicine

## 2021-09-12 DIAGNOSIS — Z743 Need for continuous supervision: Secondary | ICD-10-CM | POA: Diagnosis not present

## 2021-09-12 DIAGNOSIS — R531 Weakness: Secondary | ICD-10-CM | POA: Diagnosis not present

## 2021-09-12 DIAGNOSIS — R5381 Other malaise: Secondary | ICD-10-CM | POA: Diagnosis not present

## 2021-09-12 DIAGNOSIS — J9500 Unspecified tracheostomy complication: Secondary | ICD-10-CM | POA: Diagnosis not present

## 2021-09-14 DIAGNOSIS — Z794 Long term (current) use of insulin: Secondary | ICD-10-CM | POA: Diagnosis not present

## 2021-09-14 DIAGNOSIS — J449 Chronic obstructive pulmonary disease, unspecified: Secondary | ICD-10-CM | POA: Diagnosis not present

## 2021-09-14 DIAGNOSIS — I10 Essential (primary) hypertension: Secondary | ICD-10-CM | POA: Diagnosis not present

## 2021-09-14 DIAGNOSIS — Z7985 Long-term (current) use of injectable non-insulin antidiabetic drugs: Secondary | ICD-10-CM | POA: Diagnosis not present

## 2021-09-14 DIAGNOSIS — Z930 Tracheostomy status: Secondary | ICD-10-CM | POA: Diagnosis not present

## 2021-09-14 DIAGNOSIS — Z6841 Body Mass Index (BMI) 40.0 and over, adult: Secondary | ICD-10-CM | POA: Diagnosis not present

## 2021-09-14 DIAGNOSIS — E1165 Type 2 diabetes mellitus with hyperglycemia: Secondary | ICD-10-CM | POA: Diagnosis not present

## 2021-09-14 DIAGNOSIS — Z87448 Personal history of other diseases of urinary system: Secondary | ICD-10-CM | POA: Diagnosis not present

## 2021-09-14 DIAGNOSIS — J9610 Chronic respiratory failure, unspecified whether with hypoxia or hypercapnia: Secondary | ICD-10-CM | POA: Diagnosis not present

## 2021-09-14 DIAGNOSIS — Z7951 Long term (current) use of inhaled steroids: Secondary | ICD-10-CM | POA: Diagnosis not present

## 2021-09-14 DIAGNOSIS — E662 Morbid (severe) obesity with alveolar hypoventilation: Secondary | ICD-10-CM | POA: Diagnosis not present

## 2021-09-14 DIAGNOSIS — Z7984 Long term (current) use of oral hypoglycemic drugs: Secondary | ICD-10-CM | POA: Diagnosis not present

## 2021-09-14 DIAGNOSIS — Z8701 Personal history of pneumonia (recurrent): Secondary | ICD-10-CM | POA: Diagnosis not present

## 2021-09-20 DIAGNOSIS — E662 Morbid (severe) obesity with alveolar hypoventilation: Secondary | ICD-10-CM | POA: Diagnosis not present

## 2021-09-20 DIAGNOSIS — J9621 Acute and chronic respiratory failure with hypoxia: Secondary | ICD-10-CM | POA: Diagnosis not present

## 2021-09-20 DIAGNOSIS — G4733 Obstructive sleep apnea (adult) (pediatric): Secondary | ICD-10-CM | POA: Diagnosis not present

## 2021-09-20 DIAGNOSIS — Z930 Tracheostomy status: Secondary | ICD-10-CM | POA: Diagnosis not present

## 2021-09-25 ENCOUNTER — Other Ambulatory Visit: Payer: Self-pay | Admitting: Internal Medicine

## 2021-09-27 ENCOUNTER — Other Ambulatory Visit: Payer: Self-pay | Admitting: Internal Medicine

## 2021-09-27 DIAGNOSIS — Z930 Tracheostomy status: Secondary | ICD-10-CM | POA: Diagnosis not present

## 2021-09-30 DIAGNOSIS — I272 Pulmonary hypertension, unspecified: Secondary | ICD-10-CM | POA: Diagnosis not present

## 2021-09-30 DIAGNOSIS — I5022 Chronic systolic (congestive) heart failure: Secondary | ICD-10-CM | POA: Diagnosis not present

## 2021-10-01 ENCOUNTER — Emergency Department: Payer: PPO

## 2021-10-01 ENCOUNTER — Inpatient Hospital Stay: Payer: PPO

## 2021-10-01 ENCOUNTER — Inpatient Hospital Stay
Admission: EM | Admit: 2021-10-01 | Discharge: 2021-10-31 | DRG: 208 | Disposition: E | Payer: PPO | Attending: Pulmonary Disease | Admitting: Pulmonary Disease

## 2021-10-01 DIAGNOSIS — E785 Hyperlipidemia, unspecified: Secondary | ICD-10-CM | POA: Diagnosis not present

## 2021-10-01 DIAGNOSIS — E662 Morbid (severe) obesity with alveolar hypoventilation: Secondary | ICD-10-CM | POA: Diagnosis not present

## 2021-10-01 DIAGNOSIS — Z930 Tracheostomy status: Secondary | ICD-10-CM | POA: Diagnosis not present

## 2021-10-01 DIAGNOSIS — G9349 Other encephalopathy: Secondary | ICD-10-CM | POA: Diagnosis present

## 2021-10-01 DIAGNOSIS — Z01810 Encounter for preprocedural cardiovascular examination: Secondary | ICD-10-CM | POA: Diagnosis not present

## 2021-10-01 DIAGNOSIS — R0902 Hypoxemia: Secondary | ICD-10-CM | POA: Diagnosis not present

## 2021-10-01 DIAGNOSIS — Z20822 Contact with and (suspected) exposure to covid-19: Secondary | ICD-10-CM | POA: Diagnosis not present

## 2021-10-01 DIAGNOSIS — Z79899 Other long term (current) drug therapy: Secondary | ICD-10-CM

## 2021-10-01 DIAGNOSIS — J168 Pneumonia due to other specified infectious organisms: Secondary | ICD-10-CM | POA: Diagnosis not present

## 2021-10-01 DIAGNOSIS — Z515 Encounter for palliative care: Secondary | ICD-10-CM

## 2021-10-01 DIAGNOSIS — I272 Pulmonary hypertension, unspecified: Secondary | ICD-10-CM | POA: Diagnosis not present

## 2021-10-01 DIAGNOSIS — I462 Cardiac arrest due to underlying cardiac condition: Secondary | ICD-10-CM | POA: Diagnosis not present

## 2021-10-01 DIAGNOSIS — Z7982 Long term (current) use of aspirin: Secondary | ICD-10-CM

## 2021-10-01 DIAGNOSIS — I5022 Chronic systolic (congestive) heart failure: Secondary | ICD-10-CM | POA: Diagnosis not present

## 2021-10-01 DIAGNOSIS — J189 Pneumonia, unspecified organism: Secondary | ICD-10-CM | POA: Diagnosis not present

## 2021-10-01 DIAGNOSIS — I4901 Ventricular fibrillation: Secondary | ICD-10-CM | POA: Diagnosis not present

## 2021-10-01 DIAGNOSIS — K219 Gastro-esophageal reflux disease without esophagitis: Secondary | ICD-10-CM | POA: Diagnosis not present

## 2021-10-01 DIAGNOSIS — I700 Atherosclerosis of aorta: Secondary | ICD-10-CM | POA: Diagnosis not present

## 2021-10-01 DIAGNOSIS — I10 Essential (primary) hypertension: Secondary | ICD-10-CM

## 2021-10-01 DIAGNOSIS — R069 Unspecified abnormalities of breathing: Secondary | ICD-10-CM | POA: Diagnosis not present

## 2021-10-01 DIAGNOSIS — I110 Hypertensive heart disease with heart failure: Secondary | ICD-10-CM | POA: Diagnosis present

## 2021-10-01 DIAGNOSIS — R578 Other shock: Secondary | ICD-10-CM | POA: Diagnosis not present

## 2021-10-01 DIAGNOSIS — Z452 Encounter for adjustment and management of vascular access device: Secondary | ICD-10-CM | POA: Diagnosis not present

## 2021-10-01 DIAGNOSIS — F32A Depression, unspecified: Secondary | ICD-10-CM | POA: Diagnosis not present

## 2021-10-01 DIAGNOSIS — I248 Other forms of acute ischemic heart disease: Secondary | ICD-10-CM | POA: Diagnosis present

## 2021-10-01 DIAGNOSIS — J9620 Acute and chronic respiratory failure, unspecified whether with hypoxia or hypercapnia: Secondary | ICD-10-CM | POA: Diagnosis present

## 2021-10-01 DIAGNOSIS — E1165 Type 2 diabetes mellitus with hyperglycemia: Secondary | ICD-10-CM | POA: Diagnosis not present

## 2021-10-01 DIAGNOSIS — Z6841 Body Mass Index (BMI) 40.0 and over, adult: Secondary | ICD-10-CM | POA: Diagnosis not present

## 2021-10-01 DIAGNOSIS — Z91199 Patient's noncompliance with other medical treatment and regimen due to unspecified reason: Secondary | ICD-10-CM

## 2021-10-01 DIAGNOSIS — E874 Mixed disorder of acid-base balance: Secondary | ICD-10-CM | POA: Diagnosis present

## 2021-10-01 DIAGNOSIS — I469 Cardiac arrest, cause unspecified: Secondary | ICD-10-CM | POA: Diagnosis not present

## 2021-10-01 DIAGNOSIS — R0602 Shortness of breath: Secondary | ICD-10-CM | POA: Diagnosis not present

## 2021-10-01 DIAGNOSIS — G0430 Acute necrotizing hemorrhagic encephalopathy, unspecified: Secondary | ICD-10-CM | POA: Diagnosis not present

## 2021-10-01 DIAGNOSIS — J9622 Acute and chronic respiratory failure with hypercapnia: Secondary | ICD-10-CM | POA: Diagnosis not present

## 2021-10-01 DIAGNOSIS — J9621 Acute and chronic respiratory failure with hypoxia: Principal | ICD-10-CM | POA: Diagnosis present

## 2021-10-01 DIAGNOSIS — R918 Other nonspecific abnormal finding of lung field: Secondary | ICD-10-CM | POA: Diagnosis not present

## 2021-10-01 DIAGNOSIS — R000 Tachycardia, unspecified: Secondary | ICD-10-CM | POA: Diagnosis not present

## 2021-10-01 DIAGNOSIS — Z66 Do not resuscitate: Secondary | ICD-10-CM | POA: Diagnosis not present

## 2021-10-01 LAB — CBC WITH DIFFERENTIAL/PLATELET
Abs Immature Granulocytes: 0.05 10*3/uL (ref 0.00–0.07)
Basophils Absolute: 0 10*3/uL (ref 0.0–0.1)
Basophils Relative: 0 %
Eosinophils Absolute: 0.1 10*3/uL (ref 0.0–0.5)
Eosinophils Relative: 1 %
HCT: 43.7 % (ref 39.0–52.0)
Hemoglobin: 13.6 g/dL (ref 13.0–17.0)
Immature Granulocytes: 0 %
Lymphocytes Relative: 10 %
Lymphs Abs: 1.2 10*3/uL (ref 0.7–4.0)
MCH: 31.5 pg (ref 26.0–34.0)
MCHC: 31.1 g/dL (ref 30.0–36.0)
MCV: 101.2 fL — ABNORMAL HIGH (ref 80.0–100.0)
Monocytes Absolute: 1.4 10*3/uL — ABNORMAL HIGH (ref 0.1–1.0)
Monocytes Relative: 12 %
Neutro Abs: 8.9 10*3/uL — ABNORMAL HIGH (ref 1.7–7.7)
Neutrophils Relative %: 77 %
Platelets: 145 10*3/uL — ABNORMAL LOW (ref 150–400)
RBC: 4.32 MIL/uL (ref 4.22–5.81)
RDW: 13.9 % (ref 11.5–15.5)
WBC: 11.7 10*3/uL — ABNORMAL HIGH (ref 4.0–10.5)
nRBC: 0 % (ref 0.0–0.2)

## 2021-10-01 LAB — COMPREHENSIVE METABOLIC PANEL
ALT: 36 U/L (ref 0–44)
AST: 28 U/L (ref 15–41)
Albumin: 3.5 g/dL (ref 3.5–5.0)
Alkaline Phosphatase: 44 U/L (ref 38–126)
Anion gap: 8 (ref 5–15)
BUN: 17 mg/dL (ref 8–23)
CO2: 41 mmol/L — ABNORMAL HIGH (ref 22–32)
Calcium: 8.8 mg/dL — ABNORMAL LOW (ref 8.9–10.3)
Chloride: 89 mmol/L — ABNORMAL LOW (ref 98–111)
Creatinine, Ser: 0.77 mg/dL (ref 0.61–1.24)
GFR, Estimated: >60 mL/min (ref >60)
Glucose, Bld: 109 mg/dL — ABNORMAL HIGH (ref 70–99)
Potassium: 4.6 mmol/L (ref 3.5–5.1)
Sodium: 138 mmol/L (ref 135–145)
Total Bilirubin: 0.5 mg/dL (ref 0.3–1.2)
Total Protein: 7.1 g/dL (ref 6.5–8.1)

## 2021-10-01 LAB — URINALYSIS, COMPLETE (UACMP) WITH MICROSCOPIC
Bacteria, UA: NONE SEEN
Bilirubin Urine: NEGATIVE
Glucose, UA: NEGATIVE mg/dL
Ketones, ur: NEGATIVE mg/dL
Leukocytes,Ua: NEGATIVE
Nitrite: NEGATIVE
Protein, ur: 100 mg/dL — AB
Specific Gravity, Urine: 1.024 (ref 1.005–1.030)
pH: 5 (ref 5.0–8.0)

## 2021-10-01 LAB — BLOOD GAS, VENOUS
Acid-Base Excess: 17.3 mmol/L — ABNORMAL HIGH (ref 0.0–2.0)
Bicarbonate: 51.6 mmol/L — ABNORMAL HIGH (ref 20.0–28.0)
O2 Saturation: 79.9 %
Patient temperature: 37
pCO2, Ven: >120 mmHg (ref 44.0–60.0)
pH, Ven: 7.22 — ABNORMAL LOW (ref 7.250–7.430)
pO2, Ven: 53 mmHg — ABNORMAL HIGH (ref 32.0–45.0)

## 2021-10-01 LAB — RESP PANEL BY RT-PCR (FLU A&B, COVID) ARPGX2
Influenza A by PCR: NEGATIVE
Influenza B by PCR: NEGATIVE
SARS Coronavirus 2 by RT PCR: NEGATIVE

## 2021-10-01 LAB — PROTIME-INR
INR: 1.1 (ref 0.8–1.2)
Prothrombin Time: 14.3 seconds (ref 11.4–15.2)

## 2021-10-01 LAB — MAGNESIUM: Magnesium: 2 mg/dL (ref 1.7–2.4)

## 2021-10-01 LAB — TROPONIN I (HIGH SENSITIVITY)
Troponin I (High Sensitivity): 22 ng/L — ABNORMAL HIGH (ref <18)
Troponin I (High Sensitivity): 22 ng/L — ABNORMAL HIGH (ref <18)

## 2021-10-01 LAB — APTT: aPTT: 32 seconds (ref 24–36)

## 2021-10-01 LAB — BRAIN NATRIURETIC PEPTIDE: B Natriuretic Peptide: 8.1 pg/mL (ref 0.0–100.0)

## 2021-10-01 LAB — PHOSPHORUS: Phosphorus: 2.5 mg/dL (ref 2.5–4.6)

## 2021-10-01 LAB — LACTIC ACID, PLASMA
Lactic Acid, Venous: 2 mmol/L (ref 0.5–1.9)
Lactic Acid, Venous: 1.2 mmol/L (ref 0.5–1.9)

## 2021-10-01 LAB — PROCALCITONIN: Procalcitonin: <0.1 ng/mL

## 2021-10-01 MED ORDER — FUROSEMIDE 40 MG PO TABS
40.0000 mg | ORAL_TABLET | Freq: Every day | ORAL | Status: DC
  Administered 2021-10-02: 40 mg via ORAL
  Filled 2021-10-01: qty 1

## 2021-10-01 MED ORDER — ALBUTEROL SULFATE (2.5 MG/3ML) 0.083% IN NEBU: 3.0000 mL | INHALATION_SOLUTION | Freq: Four times a day (QID) | RESPIRATORY_TRACT | Status: DC | PRN

## 2021-10-01 MED ORDER — HEPARIN SODIUM (PORCINE) 5000 UNIT/ML IJ SOLN
5000.0000 [IU] | Freq: Three times a day (TID) | INTRAMUSCULAR | Status: DC
  Administered 2021-10-01 – 2021-10-02 (×4): 5000 [IU] via SUBCUTANEOUS
  Filled 2021-10-01 (×5): qty 1

## 2021-10-01 MED ORDER — LORAZEPAM 2 MG/ML IJ SOLN: 2.0000 mg | Freq: Once | INTRAMUSCULAR | Status: DC

## 2021-10-01 MED ORDER — DOCUSATE SODIUM 100 MG PO CAPS: 100.0000 mg | ORAL_CAPSULE | Freq: Two times a day (BID) | ORAL | Status: DC | PRN

## 2021-10-01 MED ORDER — VANCOMYCIN HCL 1500 MG/300ML IV SOLN
1500.0000 mg | Freq: Once | INTRAVENOUS | Status: AC
  Administered 2021-10-02: 1500 mg via INTRAVENOUS
  Filled 2021-10-01: qty 300

## 2021-10-01 MED ORDER — FAMOTIDINE IN NACL 20-0.9 MG/50ML-% IV SOLN
20.0000 mg | Freq: Two times a day (BID) | INTRAVENOUS | Status: DC
  Administered 2021-10-02 (×2): 20 mg via INTRAVENOUS
  Filled 2021-10-01 (×2): qty 50

## 2021-10-01 MED ORDER — BUDESONIDE 0.25 MG/2ML IN SUSP
0.2500 mg | Freq: Two times a day (BID) | RESPIRATORY_TRACT | Status: DC
  Administered 2021-10-01 – 2021-10-02 (×3): 0.25 mg via RESPIRATORY_TRACT
  Filled 2021-10-01 (×3): qty 2

## 2021-10-01 MED ORDER — PANTOPRAZOLE SODIUM 40 MG IV SOLR
40.0000 mg | Freq: Every day | INTRAVENOUS | Status: DC
  Administered 2021-10-01: 40 mg via INTRAVENOUS
  Filled 2021-10-01: qty 40

## 2021-10-01 MED ORDER — ASPIRIN EC 81 MG PO TBEC
81.0000 mg | DELAYED_RELEASE_TABLET | Freq: Every day | ORAL | Status: DC
  Administered 2021-10-02: 81 mg via ORAL
  Filled 2021-10-01: qty 1

## 2021-10-01 MED ORDER — VITAMIN D 25 MCG (1000 UNIT) PO TABS: 4000.0000 [IU] | ORAL_TABLET | Freq: Every day | ORAL | Status: DC

## 2021-10-01 MED ORDER — IPRATROPIUM-ALBUTEROL 0.5-2.5 (3) MG/3ML IN SOLN
3.0000 mL | Freq: Four times a day (QID) | RESPIRATORY_TRACT | Status: DC
  Administered 2021-10-01 – 2021-10-03 (×5): 3 mL via RESPIRATORY_TRACT
  Filled 2021-10-01 (×5): qty 3

## 2021-10-01 MED ORDER — IOHEXOL 300 MG/ML  SOLN
75.0000 mL | Freq: Once | INTRAMUSCULAR | Status: AC | PRN
  Administered 2021-10-01: 75 mL via INTRAVENOUS

## 2021-10-01 MED ORDER — VANCOMYCIN HCL 1750 MG/350ML IV SOLN
1750.0000 mg | Freq: Two times a day (BID) | INTRAVENOUS | Status: DC
  Administered 2021-10-02: 1750 mg via INTRAVENOUS
  Filled 2021-10-01 (×2): qty 350

## 2021-10-01 MED ORDER — SERTRALINE HCL 50 MG PO TABS
25.0000 mg | ORAL_TABLET | Freq: Every day | ORAL | Status: DC
  Filled 2021-10-01: qty 1

## 2021-10-01 MED ORDER — POLYETHYLENE GLYCOL 3350 17 G PO PACK: 17.0000 g | PACK | Freq: Every day | ORAL | Status: DC | PRN

## 2021-10-01 MED ORDER — METOPROLOL TARTRATE 25 MG PO TABS
25.0000 mg | ORAL_TABLET | Freq: Every day | ORAL | Status: DC
  Administered 2021-10-02: 25 mg via ORAL
  Filled 2021-10-01: qty 1

## 2021-10-01 MED ORDER — LACTATED RINGERS IV BOLUS (SEPSIS)
1000.0000 mL | Freq: Once | INTRAVENOUS | Status: AC
  Administered 2021-10-01: 1000 mL via INTRAVENOUS

## 2021-10-01 MED ORDER — SODIUM CHLORIDE 0.9 % IV SOLN
2.0000 g | Freq: Three times a day (TID) | INTRAVENOUS | Status: DC
  Administered 2021-10-01 – 2021-10-03 (×4): 2 g via INTRAVENOUS
  Filled 2021-10-01 (×7): qty 2

## 2021-10-01 MED ORDER — VANCOMYCIN HCL IN DEXTROSE 1-5 GM/200ML-% IV SOLN
1000.0000 mg | Freq: Once | INTRAVENOUS | Status: AC
  Administered 2021-10-01: 1000 mg via INTRAVENOUS
  Filled 2021-10-01: qty 200

## 2021-10-01 MED ORDER — MORPHINE SULFATE (PF) 2 MG/ML IV SOLN
2.0000 mg | Freq: Once | INTRAVENOUS | Status: AC
  Administered 2021-10-01: 2 mg via INTRAVENOUS
  Filled 2021-10-01: qty 1

## 2021-10-01 MED ORDER — SODIUM CHLORIDE 0.9 % IV SOLN
2.0000 g | Freq: Once | INTRAVENOUS | Status: AC
  Administered 2021-10-01: 2 g via INTRAVENOUS
  Filled 2021-10-01: qty 2

## 2021-10-01 NOTE — ED Provider Notes (Signed)
[facility] Provider Note    Event Date/Time   First MD Initiated Contact with Patient 10/27/2021 1649     (approximate)   History   Shortness of Breath   HPI  Eddie Chapman is a 61 y.o. male with a history of acute on chronic hypoxic and hypercapnic respiratory failure, CHF, healthcare associated pneumonia, tracheostomy dependence, obstructive sleep apnea, pulmonary hypertension, and hypertension who presents with increased shortness of breath over the last several days associated with fever or acute pain.  He states he has been compliant with his medications and ventilator but he is not sure if he may have missed a few doses.    Physical Exam   Triage Vital Signs: ED Triage Vitals [10/28/2021 1647]  Enc Vitals Group     BP (!) 127/92     Pulse Rate (!) 126     Resp (!) 40     Temp 98.3 F (36.8 C)     Temp Source Oral     SpO2 94 %     Weight      Height      Head Circumference      Peak Flow      Pain Score      Pain Loc      Pain Edu?      Excl. in [place]?     Most recent vital signs: Vitals:   10/08/2021 1745 09/30/2021 1800  BP:  103/74  Pulse: (!) 129 (!) 117  Resp: (!) 47 18  Temp:    SpO2: 95% 93%    General: Somnolent but arousable.  Oriented x4. CV:  Normal heart sounds.  Good peripheral perfusion.  Resp:  Intermittently increased respiratory effort.  Diminished breath sounds bilaterally with no wheezes or rales. Abd:  Slightly distended appearing.  Soft and nontender. Other:  Motor intact in all extremities.   ED Results / Procedures / Treatments   Labs (all labs ordered are listed, but only abnormal results are displayed) Labs Reviewed  LACTIC ACID, PLASMA - Abnormal; Notable for the following components:      Result Value   Lactic Acid, Venous 2.0 (*)    All other components within normal limits  CBC WITH DIFFERENTIAL/PLATELET - Abnormal; Notable for the following components:   WBC 11.7 (*)    MCV 101.2 (*)    Platelets  145 (*)    Neutro Abs 8.9 (*)    Monocytes Absolute 1.4 (*)    All other components within normal limits  BLOOD GAS, VENOUS - Abnormal; Notable for the following components:   pH, Ven 7.22 (*)    pCO2, Ven >120.0 (*)    pO2, Ven 53.0 (*)    Bicarbonate 51.6 (*)    Acid-Base Excess 17.3 (*)    All other components within normal limits  TROPONIN I (HIGH SENSITIVITY) - Abnormal; Notable for the following components:   Troponin I (High Sensitivity) 22 (*)    All other components within normal limits  RESP PANEL BY RT-PCR (FLU A&B, COVID) ARPGX2  CULTURE, BLOOD (ROUTINE X 2)  CULTURE, BLOOD (ROUTINE X 2)  PROTIME-INR  APTT  LACTIC ACID, PLASMA  URINALYSIS, COMPLETE (UACMP) WITH MICROSCOPIC  BRAIN NATRIURETIC PEPTIDE  COMPREHENSIVE METABOLIC PANEL  TROPONIN I (HIGH SENSITIVITY)     EKG  ED ECG REPORT I, Arta Silence, the attending physician, personally viewed and interpreted this ECG.  Date: 10/07/2021 EKG Time: 1642 Rate: 116 Rhythm: Sinus tachycardia with PVCs QRS Axis: normal  Intervals: normal ST/T Wave abnormalities: normal Narrative Interpretation: no evidence of acute ischemia    RADIOLOGY  Chest x-ray interpreted by me shows cardiomegaly and bilateral lower lobe opacities.  I reviewed the radiology report which confirms bilateral opacities and possible cavitary lesion:  IMPRESSION:  1. Patchy airspace opacities with query cavitary lesion within the  right lung  2. Likely bilateral trace pleural effusions with question of  nodularity on the left.  3. Recommend CT chest with intravenous contrast (not CTPA due to  timing of contrast) for further evaluation.     PROCEDURES:  Critical Care performed: Yes, see critical care procedure note(s)  .Critical Care Performed by: Arta Silence, MD Authorized by: Arta Silence, MD   Critical care provider statement:    Critical care time (minutes):  30   Critical care was necessary to treat or  prevent imminent or life-threatening deterioration of the following conditions:  Respiratory failure   Critical care was time spent personally by me on the following activities:  Development of treatment plan with patient or surrogate, discussions with consultants, evaluation of patient's response to treatment, examination of patient, ordering and review of laboratory studies, ordering and review of radiographic studies, ordering and performing treatments and interventions, pulse oximetry, re-evaluation of patient's condition and review of old charts   Care discussed with: admitting provider      MEDICATIONS ORDERED IN ED: Medications  lactated ringers bolus 1,000 mL (1,000 mLs Intravenous New Bag/Given 10/27/2021 1712)  vancomycin (VANCOCIN) IVPB 1000 mg/200 mL premix (1,000 mg Intravenous New Bag/Given 10/08/2021 1809)  ceFEPIme (MAXIPIME) 2 g in sodium chloride 0.9 % 100 mL IVPB (0 g Intravenous Stopped 10/16/2021 1749)     IMPRESSION / MDM / [place] / ED COURSE  I reviewed the triage vital signs and the nursing notes.  61 year old male with PMH as noted above including chronic hypoxic and hypercapnic respiratory failure due to CHF and pneumonia, on trilogy ventilator at home, who presents with increased shortness of breath over the last several days.  On exam he is somnolent but arousable.  O2 saturation is in the low 90s on 6 L O2 through his tracheostomy.  He is tachycardic and intermittently tachypneic.  Exam is otherwise as described above.  Differential diagnosis includes, but is not limited to, recurrent HCAP, sepsis due to other source of infection, acute CHF, acute bronchitis, COVID-19, influenza, other viral etiology, or less likely ACS.  I have a lower suspicion for PE.  I reviewed the past medical records including the most recent discharge summary from Dr. Mortimer Fries from the ICU service from September of last year.  At that time the patient presented similarly and required mechanical  ventilation with his trilogy ventilator and broad-spectrum antibiotics.  The patient is on the cardiac monitor to evaluate for evidence of arrhythmia and/or significant heart rate changes.  Tracheostomy has been suctioned, returning a large amount of secretions and improving the patient's O2 saturation.  I have ordered a chest x-ray, lab work-up, and broad-spectrum empiric antibiotics per the sepsis protocol.  I anticipate admission.  ----------------------------------------- 7:18 PM on 10/05/2021 -----------------------------------------  Chest x-ray shows bilateral opacities concerning for pneumonia.  The patient is already receiving antibiotics.  Respiratory panel is negative.Initial PCO2 was over 120 on the VBG.  The patient continued have borderline O2 saturations on 6 L through the tracheostomy, so we placed him on the mechanical ventilator.    I consulted APP Margaretann Loveless from ICU regarding ICU admission and after discussion  she accepted the patient.   FINAL CLINICAL IMPRESSION(S) / ED DIAGNOSES   Final diagnoses:  Acute on chronic respiratory failure with hypoxia (HCC)  Pneumonia of both lungs due to infectious organism, unspecified part of lung     Rx / DC Orders   ED Discharge Orders     None        Note:  This document was prepared using Dragon voice recognition software and may include unintentional dictation errors.    Arta Silence, MD 09/30/2021 Lurena Nida

## 2021-10-01 NOTE — H&P (Signed)
NAME:  Eddie Chapman, MRN:  [id number], DOB:  Jun 07, 1961, LOS: 0 ADMISSION DATE:  10/25/2021, CONSULTATION DATE: 10/18/2021 REFERRING MD: Dr. Cherylann Banas, CHIEF COMPLAINT: Shortness of breath  History of Present Illness:  61 year old male with significant history of multiple admissions due to acute on chronic hypoxic and hypercapnic respiratory failure with chronic tracheostomy in place presenting from home with partner to [facility] ED on 10/09/2021 due to increased shortness of breath and somnolence correlating with self-reported noncompliance with nighttime home ventilator support.  Patient currently on trach collar during the daytime and is prescribed nighttime full ventilator support due to chronic CO2 retention.  Patient is unsure how often he wears the ventilator at night, per his partner who is bedside she confirms that he was not wearing it regularly after last discharge in September 2022, but recently has been refusing due to discomfort it causes while sleeping.  The patient's partner reports that he has been sleepy and not eating/drinking well over the past 3-4 days, which the patient confirms. They both deny fevers/chills, no productive cough/chest pain, abdominal pain/diarrhea/dysuria.  We discussed at length the importance of wearing his oxygen support as prescribed.  ED course: Patient was placed on ventilator support via his chronic tracheostomy, suctioning produced significant secretions and both interventions improved his lethargy & vitals. Medications given: 1 L LR bolus, cefepime & vancomycin Initial Vitals: Afebrile 98.3, tachypneic at 40, tachycardic at 126, BP 127/92 and SPO2 94% on ventilator support 40% FiO2 Significant labs: (Labs/ Imaging personally reviewed) I, Domingo Pulse [place], AGACNP-BC, personally viewed and interpreted this ECG. EKG Interpretation: Date: 10/04/2021, EKG Time: 1642, Rate: 116, Rhythm: ST, QRS Axis: Normal, Intervals: Normal, ST/T Wave abnormalities:  None, Narrative Interpretation: Sinus tachycardia Chemistry: Cl: 89, Serum CO2/ AG: 41/8 Hematology: WBC: 11.7, Hgb: 13.6,  Troponin: 22 > 22, Lactic/ PCT: 2.0/ pending, COVID-19 & Influenza A/B: negative VBG: 7.22/ >120/ 53/ 51.6 CXR 10/13/2021: Patchy airspace opacities with query cavitary lesion within the right lung, likely bilateral trace pleural effusions with question of nodularity on the left CT chest with contrast 10/19/2021: pending  PCCM consulted for admission due to chronic tracheostomy with ventilator support.  Pertinent  Medical History  Chronic Tracheostomy with ventilator support O/N (trach collar, daytime) HLD HTN Depression Pulmonary HTN HFrEF OSA Obesity hypoventilation syndrome T8 vertebral fracture  Significant Hospital Events: Including procedures, antibiotic start and stop dates in addition to other pertinent events   10/02/2021: admit to ICU with acute on chronic hypercapnic respiratory failure on ventilator support.   Interim History / Subjective:  Patient alert & responsive with partner bedside. No current complaints at this time. Discussed plan of care, all questions answered.  Objective   Blood pressure 103/74, pulse (!) 117, temperature 98.3 F (36.8 C), temperature source Oral, resp. rate 18, SpO2 93 %.    Vent Mode: AC FiO2 (%):  [40 %] 40 % Set Rate:  [18 bmp] 18 bmp Vt Set:  [500 mL] 500 mL PEEP:  [5 cmH20] 5 cmH20  No intake or output data in the 24 hours ending 10/14/2021 1920 There were no vitals filed for this visit.  Examination: General: Adult male, acutely ill, lying in bed intubated & sedated requiring mechanical ventilation, NAD HEENT: MM pink/moist, anicteric, atraumatic, neck supple Neuro: A&O, able to follow commands, PERRL +3, MAE CV: s1s2 RRR, ST on monitor, no r/m/g Pulm: Regular, non labored on AC 45% FiO2 & PEEP 5, breath sounds expiratory wheezing-BUL & diminished-BLL GI: soft, rounded, non tender, bs x  4 Skin: redness on top lip  from healing burn- PTA, no other rashes/lesions noted Extremities: warm/dry, pulses + 2 R/P, trace edema noted  Resolved Hospital Problem list     Assessment & Plan:  Acute on chronic Hypoxic / Hypercapnic Respiratory Failure secondary to home Trilogy non compliance in the setting of possible CAP Query new cavitary lesion on CXR (10/11/2021) PMHx: OSA, obesity hypoventilation syndrome, pulmonary HTN, MRSA & pseudomonas pna - Nocturnal Ventilator settings: PRVC  7 mL/kg, 45% FiO2, 5 PEEP, continue ventilator support & lung protective strategies - Trach collar during day time as tolerated - Wean PEEP & FiO2 as tolerated, maintain SpO2 > 90% - Head of bed elevated 30 degrees, VAP protocol in place - Plateau pressures less than 30 cm H20  - Intermittent chest x-ray & ABG PRN - Ensure adequate pulmonary hygiene  - F/u cultures, trend PCT - Continue CAP coverage cefepime/vancomycin, consider addition of flagyl due to concern for cavitary lesion > STAT CT chest pending - Budesonide nebs BID, Duo-nebs Q 6, bronchodilators PRN  Mildly Elevated Troponin secondary to demand ischemia Chronic HFrEF  PMHx: HTN, HLD Troponin: 22 > 22, Echo: 01/2021 LVEF 60-65%, indeterminate diastolic parameters, normal RV function - home ASA, metoprolol & lasix continues - continuous cardiac monitoring  GERD - continue home Pepcid  Depression - continue home sertraline  Best Practice (right click and "Reselect all SmartList Selections" daily)  Diet/type: NPO DVT prophylaxis: prophylactic heparin  GI prophylaxis: H2B Lines: N/A Foley:  N/A Code Status:  full code Last date of multidisciplinary goals of care discussion [10/10/2021]  Labs   CBC: Recent Labs  Lab 10/28/2021 1656  WBC 11.7*  NEUTROABS 8.9*  HGB 13.6  HCT 43.7  MCV 101.2*  PLT 145*    Basic Metabolic Panel: No results for input(s): NA, K, CL, CO2, GLUCOSE, BUN, CREATININE, CALCIUM, MG, PHOS in the last 168 hours. GFR: CrCl cannot be  calculated (Patient's most recent lab result is older than the maximum 21 days allowed.). Recent Labs  Lab 10/22/2021 1656  WBC 11.7*  LATICACIDVEN 2.0*    Liver Function Tests: No results for input(s): AST, ALT, ALKPHOS, BILITOT, PROT, ALBUMIN in the last 168 hours. No results for input(s): LIPASE, AMYLASE in the last 168 hours. No results for input(s): AMMONIA in the last 168 hours.  ABG    Component Value Date/Time   PHART 7.13 (LL) 06/19/2021 1608   PCO2ART 82 (HH) 06/19/2021 1608   PO2ART 279 (H) 06/19/2021 1608   HCO3 51.6 (H) 10/26/2021 1725   ACIDBASEDEF 4.1 (H) 06/19/2021 1608   O2SAT 79.9 10/14/2021 1725     Coagulation Profile: Recent Labs  Lab 10/02/2021 1656  INR 1.1    Cardiac Enzymes: No results for input(s): CKTOTAL, CKMB, CKMBINDEX, TROPONINI in the last 168 hours.  HbA1C: Hgb A1c MFr Bld  Date/Time Value Ref Range Status  04/29/2021 05:02 AM 5.6 4.8 - 5.6 % Final    Comment:    (NOTE) Pre diabetes:          5.7%-6.4%  Diabetes:              >6.4%  Glycemic control for   <7.0% adults with diabetes   03/02/2021 04:52 AM 5.9 (H) 4.8 - 5.6 % Final    Comment:    (NOTE)         Prediabetes: 5.7 - 6.4         Diabetes: >6.4         Glycemic  control for adults with diabetes: <7.0     CBG: No results for input(s): GLUCAP in the last 168 hours.  Review of Systems: Positives in BOLD  Gen: Denies fever, chills, weight change, fatigue, night sweats HEENT: Denies blurred vision, double vision, hearing loss, tinnitus, sinus congestion, rhinorrhea, sore throat, neck stiffness, dysphagia PULM: Denies shortness of breath, cough, sputum production, hemoptysis, wheezing CV: Denies chest pain, edema, orthopnea, paroxysmal nocturnal dyspnea, palpitations GI: Denies abdominal pain, nausea, vomiting, diarrhea, hematochezia, melena, constipation, change in bowel habits GU: Denies dysuria, hematuria, polyuria, oliguria, urethral discharge Endocrine: Denies hot  or cold intolerance, polyuria, polyphagia or appetite change Derm: Denies rash, dry skin, scaling or peeling skin change Heme: Denies easy bruising, bleeding, bleeding gums Neuro: Denies headache, numbness, weakness, slurred speech, loss of memory or consciousness  Past Medical History:  He,  has a past medical history of Adenomatous colon polyp, Collapsed lung (08/2020), Depression, Ear drum perforation, GERD (gastroesophageal reflux disease), Hyperlipidemia, Hypertension, Pneumonia, and T8 vertebral fracture ([place]).   Surgical History:   Past Surgical History:  Procedure Laterality Date   CARPAL TUNNEL RELEASE     left hand   COLONOSCOPY  09/18/2009, 09/15/2014   COLONOSCOPY WITH PROPOFOL N/A 12/29/2017   Procedure: COLONOSCOPY WITH PROPOFOL;  Surgeon: Manya Silvas, MD;  Location: [facility] ENDOSCOPY;  Service: Endoscopy;  Laterality: N/A;   EYE SURGERY     FRACTURE SURGERY     HERNIA REPAIR     LIPOMA EXCISION     SPINE SURGERY     TRACHEOSTOMY TUBE PLACEMENT N/A 03/07/2021   Procedure: TRACHEOSTOMY;  Surgeon: Margaretha Sheffield, MD;  Location: [facility] ORS;  Service: ENT;  Laterality: N/A;     Social History:   reports that he has never smoked. He has never used smokeless tobacco. He reports that he does not drink alcohol and does not use drugs.   Family History:  His family history includes Cancer in his mother; Varicose Veins in his mother.   Allergies Allergies  Allergen Reactions   Amlodipine Swelling    Ankle swelling    Divalproex Sodium Other (See Comments)    Elevated liver enzymes     Home Medications  Prior to Admission medications   Medication Sig Start Date End Date Taking? Authorizing Provider  albuterol (VENTOLIN HFA) 108 (90 Base) MCG/ACT inhaler Inhale 2 puffs into the lungs every 6 (six) hours as needed for wheezing or shortness of breath.    [provider]  aspirin EC 81 MG tablet Take 81 mg by mouth daily.    [provider]  cholecalciferol  (VITAMIN D3) 25 MCG (1000 UNIT) tablet Take 4,000 Units by mouth daily.    [provider]  famotidine (PEPCID) 20 MG tablet Take 1 tablet (20 mg total) by mouth 2 (two) times daily. 05/04/21 06/03/21  Max Sane, MD  furosemide (LASIX) 40 MG tablet Take 1 tablet (40 mg total) by mouth daily. 05/18/21   Flora Lipps, MD  glycopyrrolate (ROBINUL) 1 MG tablet Take 0.5 mg by mouth 3 (three) times daily. 06/15/21   [provider]  ipratropium-albuterol (DUONEB) 0.5-2.5 (3) MG/3ML SOLN Take 3 mLs by nebulization every 4 (four) hours as needed. 05/17/21   Flora Lipps, MD  metoprolol tartrate (LOPRESSOR) 25 MG tablet Take 1 tablet (25 mg total) by mouth daily. 05/18/21   Flora Lipps, MD  sertraline (ZOLOFT) 25 MG tablet Take 1 tablet (25 mg total) by mouth daily. 05/18/21   Flora Lipps, MD  Critical care time: 60 minutes       Venetia Night, AGACNP-BC Acute Care Nurse Practitioner [facility]   [phone] / [phone] Please see Amion for pager details.

## 2021-10-01 NOTE — ED Triage Notes (Signed)
Pt arrived from home via EMS. Per EMS pt has been sob for the last couple of days. Pt was given a duo neb while enroute to ED. RN suctioned pt on arrival and got thick yellow mucous. Pt is on 6l/min via a trach mask.

## 2021-10-01 NOTE — Progress Notes (Signed)
PHARMACY CONSULT NOTE - FOLLOW UP  Pharmacy Consult for Electrolyte Monitoring and Replacement   Recent Labs: Potassium (mmol/L)  Date Value  10/28/2021 4.6  05/04/2014 4.3   Magnesium (mg/dL)  Date Value  06/28/2021 2.1   Calcium (mg/dL)  Date Value  10/11/2021 8.8 (L)   Calcium, Total (mg/dL)  Date Value  05/04/2014 9.1   Albumin (g/dL)  Date Value  10/17/2021 3.5   Phosphorus (mg/dL)  Date Value  06/28/2021 5.0 (H)   Sodium (mmol/L)  Date Value  10/19/2021 138  05/04/2014 140   Corr Ca: 9.2 mg/dL  Assessment: 61 y.o. male with a history of acute on chronic hypoxic and hypercapnic respiratory failure, CHF, healthcare associated pneumonia, tracheostomy dependence, obstructive sleep apnea, pulmonary hypertension, and hypertension. Pharmacy has been consulted for electrolyte replacement.  Goal of Therapy:  Electrolytes WNL  Plan:  Electrolytes WNL, no replacement indicated at this time Re-check electrolytes with AM labs  Sherilyn Banker ,PharmD Clinical Pharmacist 10/19/2021 8:23 PM

## 2021-10-01 NOTE — Progress Notes (Signed)
PHARMACY -  BRIEF ANTIBIOTIC NOTE   Pharmacy has received consult(s) for vancomycin and cefepime from an ED provider.  The patient's profile has been reviewed for ht/wt/allergies/indication/available labs.    One time order(s) placed for vanc 1 g + cefepime 2 g  Further antibiotics/pharmacy consults should be ordered by admitting physician if indicated.                       Thank you,  Tawnya Crook, PharmD, BCPS Clinical Pharmacist 10/02/2021 5:01 PM

## 2021-10-01 NOTE — Progress Notes (Signed)
Pt transported to CT on the vent and returned to ICU 8 without incident. Pt remains on the vent and is tol well at this time.

## 2021-10-01 NOTE — Consult Note (Signed)
Pharmacy Antibiotic Note  Eddie Chapman is a 61 y.o. male admitted on 10/18/2021 with pneumonia.  Pharmacy has been consulted for vancomycin and cefepime dosing.  Plan: Cefepime 2 gram Q8H Vancomycin 1000mg  + 1500 mg LD x 1  Initiate Vancomycin 1750 mg Q12H. Goal AUC 400-550 Estimated AUC 471/Cmin: 12.9 Scr 0.8, IBW, Vd 0.5     Weight: (!) 160 kg (352 lb 11.8 oz)  Temp (24hrs), Avg:98.3 F (36.8 C), Min:98.3 F (36.8 C), Max:98.3 F (36.8 C)  Recent Labs  Lab 10/23/2021 1656 10/30/2021 1840 10/16/2021 2200  WBC 11.7*  --   --   CREATININE  --  0.77  --   LATICACIDVEN 2.0*  --  1.2    Estimated Creatinine Clearance: 151.7 mL/min (by C-G formula based on SCr of 0.77 mg/dL).    Allergies  Allergen Reactions   Amlodipine Swelling    Ankle swelling    Divalproex Sodium Other (See Comments)    Elevated liver enzymes    Antimicrobials this admission: 10/10/2021 cefepime >>  10/10/2021 vancomycin >>  10/28/2021 metronidazole >>  Dose adjustments this admission:   Microbiology results: 10/22/2021 BCx: sent 10/20/2021: Resp pathogen: 10/23/2021 Sputum: sent  10/24/2021 MRSA PCR: ordered  Thank you for allowing pharmacy to be a part of this patients care.  Dorothe Pea, PharmD, BCPS Clinical Pharmacist   10/24/2021 11:12 PM

## 2021-10-02 ENCOUNTER — Inpatient Hospital Stay: Payer: PPO

## 2021-10-02 ENCOUNTER — Inpatient Hospital Stay
Admit: 2021-10-02 | Discharge: 2021-10-02 | Disposition: A | Discharge status: Home / Self Care | Payer: PPO | Attending: Nurse Practitioner | Admitting: Nurse Practitioner

## 2021-10-02 LAB — RESPIRATORY PANEL BY PCR

## 2021-10-02 LAB — BLOOD GAS, ARTERIAL
Acid-base deficit: 12.2 mmol/L — ABNORMAL HIGH (ref 0.0–2.0)
Bicarbonate: 19.3 mmol/L — ABNORMAL LOW (ref 20.0–28.0)
FIO2: 1
MECHVT: 550 mL
O2 Saturation: 87.8 %
PEEP: 10 cmH2O
Patient temperature: 37
RATE: 24 resp/min
pCO2 arterial: 73 mmHg (ref 32.0–48.0)
pH, Arterial: 7.03 — CL (ref 7.350–7.450)
pO2, Arterial: 79 mmHg — ABNORMAL LOW (ref 83.0–108.0)

## 2021-10-02 LAB — CBC
HCT: 39.2 % (ref 39.0–52.0)
HCT: 43 % (ref 39.0–52.0)
Hemoglobin: 12.4 g/dL — ABNORMAL LOW (ref 13.0–17.0)
Hemoglobin: 13.2 g/dL (ref 13.0–17.0)
MCH: 30.9 pg (ref 26.0–34.0)
MCH: 31.4 pg (ref 26.0–34.0)
MCHC: 31.6 g/dL (ref 30.0–36.0)
MCHC: 30.7 g/dL (ref 30.0–36.0)
MCV: 97.8 fL (ref 80.0–100.0)
MCV: 102.1 fL — ABNORMAL HIGH (ref 80.0–100.0)
Platelets: 168 10*3/uL (ref 150–400)
Platelets: 209 10*3/uL (ref 150–400)
RBC: 4.01 MIL/uL — ABNORMAL LOW (ref 4.22–5.81)
RBC: 4.21 MIL/uL — ABNORMAL LOW (ref 4.22–5.81)
RDW: 13.8 % (ref 11.5–15.5)
RDW: 13.7 % (ref 11.5–15.5)
WBC: 13.5 10*3/uL — ABNORMAL HIGH (ref 4.0–10.5)
WBC: 23.9 10*3/uL — ABNORMAL HIGH (ref 4.0–10.5)
nRBC: 0 % (ref 0.0–0.2)
nRBC: 0.5 % — ABNORMAL HIGH (ref 0.0–0.2)

## 2021-10-02 LAB — TROPONIN I (HIGH SENSITIVITY)
Troponin I (High Sensitivity): 3422 ng/L (ref <18)
Troponin I (High Sensitivity): >24000 ng/L (ref <18)

## 2021-10-02 LAB — BASIC METABOLIC PANEL
Anion gap: 8 (ref 5–15)
Anion gap: 22 — ABNORMAL HIGH (ref 5–15)
Anion gap: 17 — ABNORMAL HIGH (ref 5–15)
BUN: 19 mg/dL (ref 8–23)
BUN: 22 mg/dL (ref 8–23)
BUN: 34 mg/dL — ABNORMAL HIGH (ref 8–23)
CO2: 41 mmol/L — ABNORMAL HIGH (ref 22–32)
CO2: 24 mmol/L (ref 22–32)
CO2: 28 mmol/L (ref 22–32)
Calcium: 8.8 mg/dL — ABNORMAL LOW (ref 8.9–10.3)
Calcium: 9.5 mg/dL (ref 8.9–10.3)
Calcium: 9 mg/dL (ref 8.9–10.3)
Chloride: 92 mmol/L — ABNORMAL LOW (ref 98–111)
Chloride: 90 mmol/L — ABNORMAL LOW (ref 98–111)
Chloride: 92 mmol/L — ABNORMAL LOW (ref 98–111)
Creatinine, Ser: 0.82 mg/dL (ref 0.61–1.24)
Creatinine, Ser: 1.47 mg/dL — ABNORMAL HIGH (ref 0.61–1.24)
Creatinine, Ser: 2.16 mg/dL — ABNORMAL HIGH (ref 0.61–1.24)
GFR, Estimated: >60 mL/min (ref >60)
GFR, Estimated: 54 mL/min — ABNORMAL LOW (ref >60)
GFR, Estimated: 34 mL/min — ABNORMAL LOW (ref >60)
Glucose, Bld: 116 mg/dL — ABNORMAL HIGH (ref 70–99)
Glucose, Bld: 300 mg/dL — ABNORMAL HIGH (ref 70–99)
Glucose, Bld: 329 mg/dL — ABNORMAL HIGH (ref 70–99)
Potassium: 4.3 mmol/L (ref 3.5–5.1)
Potassium: 5 mmol/L (ref 3.5–5.1)
Potassium: 4.3 mmol/L (ref 3.5–5.1)
Sodium: 141 mmol/L (ref 135–145)
Sodium: 136 mmol/L (ref 135–145)
Sodium: 137 mmol/L (ref 135–145)

## 2021-10-02 LAB — BLOOD GAS, VENOUS
Acid-Base Excess: 20.1 mmol/L — ABNORMAL HIGH (ref 0.0–2.0)
Bicarbonate: 48 mmol/L — ABNORMAL HIGH (ref 20.0–28.0)
FIO2: 0.45
MECHVT: 500 mL
Mechanical Rate: 18
O2 Saturation: 80 %
PEEP: 5 cmH2O
Patient temperature: 37
pCO2, Ven: 69 mmHg — ABNORMAL HIGH (ref 44.0–60.0)
pH, Ven: 7.45 — ABNORMAL HIGH (ref 7.250–7.430)
pO2, Ven: 42 mmHg (ref 32.0–45.0)

## 2021-10-02 LAB — PROCALCITONIN: Procalcitonin: 0.1 ng/mL

## 2021-10-02 LAB — PHOSPHORUS: Phosphorus: 2.1 mg/dL — ABNORMAL LOW (ref 2.5–4.6)

## 2021-10-02 LAB — LACTIC ACID, PLASMA
Lactic Acid, Venous: >9 mmol/L (ref 0.5–1.9)
Lactic Acid, Venous: 7.9 mmol/L (ref 0.5–1.9)
Lactic Acid, Venous: 6.7 mmol/L (ref 0.5–1.9)

## 2021-10-02 LAB — GLUCOSE, CAPILLARY
Glucose-Capillary: 311 mg/dL — ABNORMAL HIGH (ref 70–99)
Glucose-Capillary: 294 mg/dL — ABNORMAL HIGH (ref 70–99)

## 2021-10-02 LAB — MRSA NEXT GEN BY PCR, NASAL: MRSA by PCR Next Gen: DETECTED — AB

## 2021-10-02 LAB — PROTIME-INR
INR: 1.4 — ABNORMAL HIGH (ref 0.8–1.2)
Prothrombin Time: 17.3 seconds — ABNORMAL HIGH (ref 11.4–15.2)

## 2021-10-02 LAB — MAGNESIUM
Magnesium: 1.9 mg/dL (ref 1.7–2.4)
Magnesium: 2.9 mg/dL — ABNORMAL HIGH (ref 1.7–2.4)

## 2021-10-02 LAB — D-DIMER, QUANTITATIVE: D-Dimer, Quant: >20 ug/mL-FEU — ABNORMAL HIGH (ref 0.00–0.50)

## 2021-10-02 LAB — HIV ANTIBODY (ROUTINE TESTING W REFLEX): HIV Screen 4th Generation wRfx: NONREACTIVE

## 2021-10-02 MED ORDER — INSULIN ASPART 100 UNIT/ML IJ SOLN
20.0000 [IU] | Freq: Once | INTRAMUSCULAR | Status: AC
  Administered 2021-10-02: 20 [IU] via SUBCUTANEOUS

## 2021-10-02 MED ORDER — CHLORHEXIDINE GLUCONATE CLOTH 2 % EX PADS: 6.0000 | MEDICATED_PAD | Freq: Every day | CUTANEOUS | Status: DC

## 2021-10-02 MED ORDER — MAGNESIUM SULFATE 50 % IJ SOLN
INTRAMUSCULAR | Status: AC | PRN
Start: 2021-10-02 — End: 2021-10-02
  Administered 2021-10-02: 2 g via INTRAVENOUS

## 2021-10-02 MED ORDER — EPINEPHRINE HCL 5 MG/250ML IV SOLN IN NS
0.5000 ug/min | INTRAVENOUS | Status: DC
  Administered 2021-10-02: 3 ug/min via INTRAVENOUS
  Filled 2021-10-02 (×2): qty 250

## 2021-10-02 MED ORDER — PROPOFOL 1000 MG/100ML IV EMUL
5.0000 ug/kg/min | INTRAVENOUS | Status: DC
  Administered 2021-10-02: 15 ug/kg/min via INTRAVENOUS
  Filled 2021-10-02: qty 100

## 2021-10-02 MED ORDER — EPINEPHRINE HCL 5 MG/250ML IV SOLN IN NS: 0.5000 ug/min | INTRAVENOUS | Status: DC

## 2021-10-02 MED ORDER — VANCOMYCIN VARIABLE DOSE PER UNSTABLE RENAL FUNCTION (PHARMACIST DOSING): Status: DC

## 2021-10-02 MED ORDER — INSULIN ASPART 100 UNIT/ML IJ SOLN: 3.0000 [IU] | INTRAMUSCULAR | Status: DC

## 2021-10-02 MED ORDER — VASOPRESSIN 20 UNITS/100 ML INFUSION FOR SHOCK
0.0000 [IU]/min | INTRAVENOUS | Status: DC
  Administered 2021-10-02 (×2): 0.03 [IU]/min via INTRAVENOUS
  Filled 2021-10-02 (×2): qty 100

## 2021-10-02 MED ORDER — EPINEPHRINE 1 MG/10ML IJ SOSY
PREFILLED_SYRINGE | INTRAMUSCULAR | Status: AC | PRN
Start: 2021-10-02 — End: 2021-10-02
  Administered 2021-10-02 (×3): 1 mg via INTRAVENOUS

## 2021-10-02 MED ORDER — FENTANYL CITRATE PF 50 MCG/ML IJ SOSY
50.0000 ug | PREFILLED_SYRINGE | INTRAMUSCULAR | Status: DC | PRN
  Administered 2021-10-03 (×3): 50 ug via INTRAVENOUS
  Filled 2021-10-02 (×3): qty 1

## 2021-10-02 MED ORDER — NOREPINEPHRINE BITARTRATE 1 MG/ML IV SOLN
INTRAVENOUS | Status: AC | PRN
Start: 2021-10-02 — End: 2021-10-02
  Administered 2021-10-02: 10 ug via INTRAVENOUS

## 2021-10-02 MED ORDER — NOREPINEPHRINE 16 MG/250ML-% IV SOLN
0.0000 ug/min | INTRAVENOUS | Status: DC
  Administered 2021-10-02: 30 ug/min via INTRAVENOUS
  Filled 2021-10-02: qty 250

## 2021-10-02 MED ORDER — LACTATED RINGERS IV SOLN: INTRAVENOUS | Status: AC

## 2021-10-02 MED ORDER — CALCIUM CHLORIDE 10 % IV SOLN
INTRAVENOUS | Status: AC | PRN
Start: 2021-10-02 — End: 2021-10-02
  Administered 2021-10-02: 1 g via INTRAVENOUS

## 2021-10-02 MED ORDER — FUROSEMIDE 10 MG/ML IJ SOLN
20.0000 mg | Freq: Once | INTRAMUSCULAR | Status: AC
  Administered 2021-10-02: 20 mg via INTRAVENOUS
  Filled 2021-10-02: qty 2

## 2021-10-02 MED ORDER — NOREPINEPHRINE 4 MG/250ML-% IV SOLN
INTRAVENOUS | Status: AC | PRN
  Administered 2021-10-02: 10 ug/min via INTRAVENOUS

## 2021-10-02 MED ORDER — NOREPINEPHRINE 4 MG/250ML-% IV SOLN
0.0000 ug/min | INTRAVENOUS | Status: DC
  Filled 2021-10-02: qty 250

## 2021-10-02 MED ORDER — ACETAMINOPHEN 10 MG/ML IV SOLN
1000.0000 mg | Freq: Four times a day (QID) | INTRAVENOUS | Status: DC
Start: 2021-10-03 — End: 2021-10-03
  Administered 2021-10-02 – 2021-10-03 (×2): 1000 mg via INTRAVENOUS
  Filled 2021-10-02 (×4): qty 100

## 2021-10-02 MED ORDER — INSULIN ASPART 100 UNIT/ML IJ SOLN
3.0000 [IU] | INTRAMUSCULAR | Status: DC
  Administered 2021-10-03: 6 [IU] via SUBCUTANEOUS
  Filled 2021-10-02 (×2): qty 1

## 2021-10-02 MED ORDER — SODIUM BICARBONATE 8.4 % IV SOLN
INTRAVENOUS | Status: AC | PRN
  Administered 2021-10-02: 50 meq via INTRAVENOUS

## 2021-10-02 MED ORDER — DEXTROSE 5 % IV SOLN
15.0000 mmol | Freq: Once | INTRAVENOUS | Status: DC
  Filled 2021-10-02: qty 5

## 2021-10-02 MED ORDER — AMIODARONE HCL 150 MG/3ML IV SOLN
INTRAVENOUS | Status: AC | PRN
Start: 2021-10-02 — End: 2021-10-02
  Administered 2021-10-02: 300 mg via INTRAVENOUS

## 2021-10-02 MED ORDER — PERFLUTREN LIPID MICROSPHERE
1.0000 mL | INTRAVENOUS | Status: AC | PRN
  Administered 2021-10-02: 4 mL via INTRAVENOUS
  Filled 2021-10-02: qty 10

## 2021-10-02 NOTE — Code Documentation (Signed)
Rhythm noted on monitor. CPR paused for pulse check, pt noted to be in VFIB on monitor, pt shocked at 200 j, CPR resumed

## 2021-10-02 NOTE — Progress Notes (Signed)
PHARMACY CONSULT NOTE - FOLLOW UP  Pharmacy Consult for Electrolyte Monitoring and Replacement   Recent Labs: Potassium (mmol/L)  Date Value  10/02/2021 4.3  05/04/2014 4.3   Magnesium (mg/dL)  Date Value  10/02/2021 1.9   Calcium (mg/dL)  Date Value  10/02/2021 8.8 (L)   Calcium, Total (mg/dL)  Date Value  05/04/2014 9.1   Albumin (g/dL)  Date Value  10/12/2021 3.5   Phosphorus (mg/dL)  Date Value  10/02/2021 2.1 (L)   Sodium (mmol/L)  Date Value  10/02/2021 141  05/04/2014 140   Corr Ca: 9.2 mg/dL  Assessment: 61 y.o. male with a history of acute on chronic hypoxic and hypercapnic respiratory failure, CHF, healthcare associated pneumonia, tracheostomy dependence, obstructive sleep apnea, pulmonary hypertension, and hypertension. Pharmacy has been consulted for electrolyte replacement.  Goal of Therapy:  Electrolytes WNL  Plan:  K 4.3,  Phos 2.1  Mag 1.9  Na 141  Scr 0.82 On lasix 40mg  po daily Will order Sodium Phosphate 15 mmol IV x1 Re-check electrolytes with AM labs  Noralee Space ,PharmD Clinical Pharmacist 10/02/2021 9:12 AM

## 2021-10-02 NOTE — Code Documentation (Signed)
IV access lost, preparing for IO

## 2021-10-02 NOTE — Procedures (Signed)
Central Venous Catheter Insertion Procedure Note  Eddie Chapman  [id number]  1961-09-14  Date:10/02/21  GPQD:82:64 PM   Provider Performing:Jin Capote Jeneen Rinks   Procedure: Insertion of Non-tunneled Central Venous Catheter(36556) with US guidance (15830)   Indication(s) Medication administration and Difficult access  Consent Unable to obtain consent due to emergent nature of procedure.  Anesthesia None, post cardiac arrset  Timeout Verified patient identification, verified procedure, site/side was marked, verified correct patient position, special equipment/implants available, medications/allergies/relevant history reviewed, required imaging and test results available.  Sterile Technique Maximal sterile technique including full sterile barrier drape, hand hygiene, sterile gown, sterile gloves, mask, hair covering, sterile ultrasound probe cover (if used).  Procedure Description Area of catheter insertion was cleaned with chlorhexidine and draped in sterile fashion.  With real-time ultrasound guidance a central venous catheter was placed into the right internal jugular vein. Nonpulsatile blood flow and easy flushing noted in all ports.  The catheter was sutured in place and sterile dressing applied.  Complications/Tolerance None; patient tolerated the procedure well. Chest X-ray is ordered to verify placement for internal jugular EBL Minimal  Specimen(s) None  Tonye Royalty ACNP-BC

## 2021-10-02 NOTE — Code Documentation (Signed)
X-ray at bedside

## 2021-10-02 NOTE — Progress Notes (Signed)
CODE NOTE:  Called to bedside at 1037 due to patient going into cardiac arrest and actively receiving CPR.   Upon arrival to bedside, CPR in progress and patient had received 1 round of Epi at that time with an initial rhythm being PEA prior to chest compressions.  Per bedside RN, the patient was examined 15 minutes prior to arrest and was awake, disoriented with stable vital signs. RT came to bedside due to patient disconnecting himself from the vent, RN was called to bedside due to patient being unresponsive. Patient was pulseless and CPR was initiated.  AM labs were reviewed from 0700 this AM. Essentially unremarkable.  Patient required multiple rounds of CPR and Defibrillations. Initial rhythm was PEA followed by V-fib x3. ROSC was achieved briefly following 3rd shock, but then quickly went into pulseless Vtach requiring additional defibrillation. Following rhythms were PEA until ROSC achieved at 1106. Rhythm post ROSC was Sinus tachycardia  Treatment throughout code included: IO access, 6 Epi, 300 Amio, 1Bicarb, 1 calcium chloride, 1L LR bolus, Initiation of a Levophed gtt at 84mcg following brief ROSC prior to arresting again  Post ROSC treatment included: --Emergent central line placement in the RIJ due to poor IV access on vasopressors --Levophed gtt at 51mcg --2 rounds of push dose Epi followed by Epi gtt at 3 --Patient transferred to ICU    Tonye Royalty ACNP-BC

## 2021-10-02 NOTE — Progress Notes (Signed)
Pt. Transported to ICU without incident.

## 2021-10-02 NOTE — Procedures (Signed)
Arterial Catheter Insertion Procedure Note  IRIE FIORELLO  [id number]  1961-08-16  Date:10/02/21  Time:12:55 PM    Provider Performing: Tonye Royalty    Procedure: Insertion of Arterial Line [phone]) without US guidance  Indication(s) Blood pressure monitoring and/or need for frequent ABGs  Consent Unable to obtain consent due to emergent nature of procedure.  Anesthesia None   Time Out Verified patient identification, verified procedure, site/side was marked, verified correct patient position, special equipment/implants available, medications/allergies/relevant history reviewed, required imaging and test results available.   Sterile Technique Maximal sterile technique including full sterile barrier drape, hand hygiene, sterile gown, sterile gloves, mask, hair covering, sterile ultrasound probe cover (if used).   Procedure Description Area of catheter insertion was cleaned with chlorhexidine and draped in sterile fashion. Without real-time ultrasound guidance an arterial catheter was placed into the left radial artery.  Appropriate arterial tracings confirmed on monitor.     Complications/Tolerance None; patient tolerated the procedure well.   EBL Minimal   Specimen(s) None    Tonye Royalty ACNP-BC

## 2021-10-02 NOTE — Progress Notes (Signed)
GOALS OF CARE FAMILY CONFERENCE   Current clinical status, hospital findings and medical plan was reviewed with family.   Updated and notified of patients ongoing immediate critical medical problems.   Patient remains unresponsive acutely comatose    Patient is unable to breathe independently, unable to protect airway and unable to mobilize secretions.    Explained to family course of therapy and the modalities   Patient with Progressive multiorgan failure with high probability of a very minimal chance of meaningful recovery despite aggressive and optimal medical therapy.   Family is appreciative of care and relate understanding that patient is severely critically ill.  They have consented and agreed to DNR/DNI  Code status   Family are satisfied with Plan of action and management. All questions answered  Additional Critical Care time 35 mins    Ottie Glazier, M.D.  [facility] [place]

## 2021-10-02 NOTE — Progress Notes (Addendum)
NAME:  Eddie Chapman, MRN:  [id number], DOB:  02/13/1961, LOS: 1 ADMISSION DATE:  09/30/2021, CONSULTATION DATE:  09/30/2021 REFERRING MD:  ED, CHIEF COMPLAINT:  Shortness of breath   History of Present Illness:  61 year old male with significant history of multiple admissions due to acute on chronic hypoxic and hypercapnic respiratory failure with chronic tracheostomy in place presenting from home with partner to [facility] ED on 10/06/2021 due to increased shortness of breath and somnolence correlating with self-reported noncompliance with nighttime home ventilator support. 3-4 day history of lethargy and decreased appetitie.   Pertinent  Medical History  Chronic Tracheostomy with ventilator support O/N (trach collar, daytime) HLD HTN Depression Pulmonary HTN HFrEF OSA Obesity hypoventilation syndrome T8 vertebral fracture  Significant Hospital Events: Including procedures, antibiotic start and stop dates in addition to other pertinent events   10/19/2021: Admitted to ICU with acute on chronic hypercapnic respiratory failure on ventilator support 10/02/21: Cardiac Arrest. Prolonged downtime of 30 minutes 10/02/21: Central line and Arterial Line placed  Interim History / Subjective:  While boarding in the ED awaiting ICU bed, patient went into cardiac arrest. Called to bedside at 1037. Per RN, last seen awake but disoriented 15 minutes prior. RN reports patients vital signs were stable at that time. Patient then pulled off vent tubing and RT came to bedside and patient reportedly did not look well and RN called back to bedside and patient was pulseless and CPR was initiated. See Separate Code note. Multiple rounds of CPR including defibrillation at least 4 times.   Objective   Blood pressure 129/88, pulse (!) 112, temperature (!) 97.2 F (36.2 C), temperature source Bladder, resp. rate 19, weight (!) 158.8 kg, SpO2 98 %.    Vent Mode: PRVC FiO2 (%):  [40 %-100 %] 100 % Set Rate:  [18 bmp-24 bmp] 20  bmp Vt Set:  [500 mL-550 mL] 500 mL PEEP:  [5 cmH20-10 cmH20] 8 cmH20 Plateau Pressure:  [28 cmH20] 28 cmH20   Intake/Output Summary (Last 24 hours) at 10/02/2021 1303 Last data filed at 10/02/2021 0920 Gross per 24 hour  Intake 602.83 ml  Output --  Net 602.83 ml   Filed Weights   10/24/2021 2257 10/02/21 1230  Weight: (!) 160 kg (!) 158.8 kg    Examination: General: Unresponsive on Vent, ill appearing, post cardiac arrest HENT: Pupils pinpoint non-reactive Lungs: Lung sounds diminished throughout Cardiovascular: Heart sounds S1S2, extremities cool to touch, pulses palpable Abdomen: Obese rounded abdomen, bowel sounds hypoactive Extremities: Mottled, no deformities Neuro: Unresponsive, currently not on sedation and GCS 3 GU: Foley in place with no urine output  Resolved Hospital Problem list   NA  Assessment & Plan:  Undifferentiated Shock S/P in hospital cardiac arrest --Etiology unclear respiratory arrest vs possible PE vs MI vs other causes --Prolonged downtime of 30 minutes w/multiple shocks required --Lactic acid 9, will trend Q4hr --Blood cultures, respiratory culture, UA pending --Stat ECHO pending --EKG pending --Troponin, D-Dimer pending --Due to instability unable to obtain CT scans at this time --Continue Vasopressor therapy: Levophed 20/Vaso 0.03/Epi 3 --Central line and arterial line placed --Continue broad spectrum antibiotics vanc/cefepime  Acute on Chronic Hypoxic/Hypercapnic Respiratory failure Community Acquired Pneumonia Trilogy non compliance at home --Continue mechanical ventilation via chronic Trach --Repeat ABG pending --Repeat CXR showed no pneumothorax or acute changes --VAP Bundle --RPP negative --Sputum culture pending --Antibiotics as above --Continue Budesonide nebs BID, Duonebs Q6 and PRN bronchodilators  Mixed Acidosis Elevated Lactate --S/P Cardiac arrest --Lactate 9  --Trend  q4hr --Acidosis improving --Bicarb  23  Hyperglycemia --Glucose 300 --SSI  Elevated Troponin --Initially 22 on arrival, post arrest 3000 --Last ECHO 01/2021: LVEF 60-65% with normal RV function --Repeat ECHO and EKG pending --Trend Q6 hour --Consider Heparin gtt  Best Practice (right click and "Reselect all SmartList Selections" daily)   Diet/type: NPO DVT prophylaxis: prophylactic heparin  GI prophylaxis: H2B Lines: Central line and Arterial Line Foley:  Yes, and it is still needed Code Status:  full code Last date of multidisciplinary goals of care discussion [Spoke with patients fiancee [name] and updated her on patients status]  Labs   CBC: Recent Labs  Lab 10/21/2021 1656 10/02/21 0702 10/02/21 1157  WBC 11.7* 13.5* 23.9*  NEUTROABS 8.9*  --   --   HGB 13.6 12.4* 13.2  HCT 43.7 39.2 43.0  MCV 101.2* 97.8 102.1*  PLT 145* 168 353    Basic Metabolic Panel: Recent Labs  Lab 10/06/2021 1840 10/16/2021 2200 10/02/21 0702  NA 138  --  141  K 4.6  --  4.3  CL 89*  --  92*  CO2 41*  --  41*  GLUCOSE 109*  --  116*  BUN 17  --  19  CREATININE 0.77  --  0.82  CALCIUM 8.8*  --  8.8*  MG  --  2.0 1.9  PHOS  --  2.5 2.1*   GFR: Estimated Creatinine Clearance: 147.3 mL/min (by C-G formula based on SCr of 0.82 mg/dL). Recent Labs  Lab 10/08/2021 1656 10/24/2021 1840 10/05/2021 2200 10/02/21 0702 10/02/21 1157  PROCALCITON  --  <0.10  --  0.10  --   WBC 11.7*  --   --  13.5* 23.9*  LATICACIDVEN 2.0*  --  1.2  --   --     Liver Function Tests: Recent Labs  Lab 10/12/2021 1840  AST 28  ALT 36  ALKPHOS 44  BILITOT 0.5  PROT 7.1  ALBUMIN 3.5   No results for input(s): LIPASE, AMYLASE in the last 168 hours. No results for input(s): AMMONIA in the last 168 hours.  ABG    Component Value Date/Time   PHART 7.03 (LL) 10/02/2021 1128   PCO2ART 73 (HH) 10/02/2021 1128   PO2ART 79 (L) 10/02/2021 1128   HCO3 19.3 (L) 10/02/2021 1128   ACIDBASEDEF 12.2 (H) 10/02/2021 1128   O2SAT 87.8 10/02/2021 1128      Coagulation Profile: Recent Labs  Lab 10/23/2021 1656  INR 1.1    Cardiac Enzymes: No results for input(s): CKTOTAL, CKMB, CKMBINDEX, TROPONINI in the last 168 hours.  HbA1C: Hgb A1c MFr Bld  Date/Time Value Ref Range Status  04/29/2021 05:02 AM 5.6 4.8 - 5.6 % Final    Comment:    (NOTE) Pre diabetes:          5.7%-6.4%  Diabetes:              >6.4%  Glycemic control for   <7.0% adults with diabetes   03/02/2021 04:52 AM 5.9 (H) 4.8 - 5.6 % Final    Comment:    (NOTE)         Prediabetes: 5.7 - 6.4         Diabetes: >6.4         Glycemic control for adults with diabetes: <7.0     CBG: No results for input(s): GLUCAP in the last 168 hours.  Review of Systems:   Unable to assess due to patients condition  Past Medical History:  He,  has a past medical history of Adenomatous colon polyp, Collapsed lung (08/2020), Depression, Ear drum perforation, GERD (gastroesophageal reflux disease), Hyperlipidemia, Hypertension, Pneumonia, and T8 vertebral fracture ([place]).   Surgical History:   Past Surgical History:  Procedure Laterality Date   CARPAL TUNNEL RELEASE     left hand   COLONOSCOPY  09/18/2009, 09/15/2014   COLONOSCOPY WITH PROPOFOL N/A 12/29/2017   Procedure: COLONOSCOPY WITH PROPOFOL;  Surgeon: Manya Silvas, MD;  Location: [facility] ENDOSCOPY;  Service: Endoscopy;  Laterality: N/A;   EYE SURGERY     FRACTURE SURGERY     HERNIA REPAIR     LIPOMA EXCISION     SPINE SURGERY     TRACHEOSTOMY TUBE PLACEMENT N/A 03/07/2021   Procedure: TRACHEOSTOMY;  Surgeon: Margaretha Sheffield, MD;  Location: [facility] ORS;  Service: ENT;  Laterality: N/A;     Social History:   reports that he has never smoked. He has never used smokeless tobacco. He reports that he does not drink alcohol and does not use drugs.   Family History:  His family history includes Cancer in his mother; Varicose Veins in his mother.   Allergies Allergies  Allergen Reactions   Amlodipine Swelling    Ankle  swelling    Divalproex Sodium Other (See Comments)    Elevated liver enzymes     Home Medications  Prior to Admission medications   Medication Sig Start Date End Date Taking? Authorizing Provider  albuterol (VENTOLIN HFA) 108 (90 Base) MCG/ACT inhaler Inhale 2 puffs into the lungs every 6 (six) hours as needed for wheezing or shortness of breath.   Yes [provider]  aspirin EC 81 MG tablet Take 81 mg by mouth daily.   Yes [provider]  cholecalciferol (VITAMIN D3) 25 MCG (1000 UNIT) tablet Take 4,000 Units by mouth daily.   Yes [provider]  Dulaglutide (TRULICITY) 9.48 NI/6.2VO SOPN Inject 0.75 mg into the skin every 7 (seven) days. 09/14/21  Yes [provider]  furosemide (LASIX) 40 MG tablet Take 1 tablet (40 mg total) by mouth daily. 05/18/21  Yes Kasa, Maretta Bees, MD  insulin glargine, 1 Unit Dial, (TOUJEO) 300 UNIT/ML Solostar Pen Inject 55 Units into the skin at bedtime. (Increase by 5u every other day until FBS is <140 mg/dl) 08/06/21 10/14/21 Yes [provider]  ipratropium-albuterol (DUONEB) 0.5-2.5 (3) MG/3ML SOLN Take 3 mLs by nebulization every 4 (four) hours as needed. 05/17/21  Yes Kasa, Maretta Bees, MD  lovastatin (MEVACOR) 40 MG tablet Take 40 mg by mouth daily with supper. 07/10/21  Yes [provider]  metFORMIN (GLUCOPHAGE) 1000 MG tablet Take 1,000 mg by mouth 2 (two) times daily with a meal. 09/13/21 09/13/22 Yes [provider]  metoprolol tartrate (LOPRESSOR) 25 MG tablet Take 1 tablet (25 mg total) by mouth daily. 05/18/21  Yes Flora Lipps, MD  sertraline (ZOLOFT) 25 MG tablet Take 1 tablet (25 mg total) by mouth daily. 05/18/21  Yes Flora Lipps, MD  spironolactone (ALDACTONE) 25 MG tablet Take 25 mg by mouth daily. 09/12/21  Yes [provider]  Azelastine HCl 137 MCG/SPRAY SOLN Place 1 spray into both nostrils daily. 07/10/21   [provider]  famotidine (PEPCID) 20 MG tablet Take 1 tablet  (20 mg total) by mouth 2 (two) times daily. 05/04/21 06/03/21  Max Sane, MD  glycopyrrolate (ROBINUL) 1 MG tablet Take 0.5 mg by mouth 3 (three) times daily. 06/15/21   [provider]     Critical care time: 74  minutes

## 2021-10-02 NOTE — Code Documentation (Signed)
CPR paused for pulse check, no palpable pulse present. PEA on monitor. CPR resumed

## 2021-10-02 NOTE — Code Documentation (Signed)
Incorrect dose of Levophed documented. Pt started on a levo drip, pt not given injection of levo

## 2021-10-02 NOTE — Progress Notes (Signed)
Pt. Found disconnected from vent ,graying blue color at the head and chest with no pulse.Called for help and CPR started and lasted for 45 minutes.Placed on vent at 1115 am on AC 550/24/100/10.

## 2021-10-02 NOTE — Code Documentation (Signed)
1 liter LR infusing in IO at this time

## 2021-10-02 NOTE — Progress Notes (Signed)
*  PRELIMINARY RESULTS* Echocardiogram 2D Echocardiogram has been performed.  Eddie Chapman 10/02/2021, 1:53 PM

## 2021-10-02 NOTE — Code Documentation (Signed)
CPR continues at this time

## 2021-10-02 NOTE — ED Notes (Signed)
Central line being placed at this time.

## 2021-10-02 NOTE — Code Documentation (Signed)
Pt in [place] on monitor. Pt Shocked at 200 J. CPR resumed

## 2021-10-03 LAB — ECHOCARDIOGRAM LIMITED
S' Lateral: 2.75 cm
Weight: 5601.45 oz

## 2021-10-03 LAB — GLUCOSE, CAPILLARY
Glucose-Capillary: 138 mg/dL — ABNORMAL HIGH (ref 70–99)
Glucose-Capillary: 173 mg/dL — ABNORMAL HIGH (ref 70–99)

## 2021-10-03 MED ORDER — SODIUM CHLORIDE 0.9 % IV SOLN
2.0000 g | Freq: Two times a day (BID) | INTRAVENOUS | Status: DC
  Filled 2021-10-03: qty 2

## 2021-10-03 MED ORDER — MIDAZOLAM HCL 2 MG/2ML IJ SOLN
2.0000 mg | INTRAMUSCULAR | Status: DC | PRN
  Administered 2021-10-03 (×2): 2 mg via INTRAVENOUS
  Filled 2021-10-03: qty 2

## 2021-10-03 MED ORDER — MORPHINE SULFATE (PF) 2 MG/ML IV SOLN
2.0000 mg | Freq: Once | INTRAVENOUS | Status: AC
  Administered 2021-10-03: 2 mg via INTRAVENOUS

## 2021-10-03 MED ORDER — MORPHINE SULFATE (PF) 2 MG/ML IV SOLN
2.0000 mg | INTRAVENOUS | Status: DC | PRN
  Administered 2021-10-03 (×2): 2 mg via INTRAVENOUS
  Filled 2021-10-03 (×2): qty 1

## 2021-10-03 MED ORDER — MORPHINE SULFATE (PF) 2 MG/ML IV SOLN
INTRAVENOUS | Status: AC
  Filled 2021-10-03: qty 1

## 2021-10-03 MED ORDER — MIDAZOLAM HCL 2 MG/2ML IJ SOLN
INTRAMUSCULAR | Status: AC
  Filled 2021-10-03: qty 2

## 2021-10-04 LAB — LACTIC ACID, PLASMA: Lactic Acid, Venous: 5.9 mmol/L (ref 0.5–1.9)

## 2021-10-06 LAB — CULTURE, BLOOD (ROUTINE X 2)
Culture: NO GROWTH
Culture: NO GROWTH

## 2021-10-24 LAB — BLOOD GAS, ARTERIAL
Acid-base deficit: 5.2 mmol/L — ABNORMAL HIGH (ref 0.0–2.0)
Acid-base deficit: 1 mmol/L (ref 0.0–2.0)
Bicarbonate: 23 mmol/L (ref 20.0–28.0)
Bicarbonate: 27.1 mmol/L (ref 20.0–28.0)
FIO2: 100
FIO2: 100
MECHVT: 500 mL
MECHVT: 500 mL
O2 Saturation: 98.6 %
O2 Saturation: 98 %
PEEP: 8 cmH2O
PEEP: 8 cmH2O
Patient temperature: 37
Patient temperature: 37
RATE: 20 resp/min
RATE: 20 resp/min
pCO2 arterial: 55 mmHg — ABNORMAL HIGH (ref 32.0–48.0)
pCO2 arterial: 59 mmHg — ABNORMAL HIGH (ref 32.0–48.0)
pH, Arterial: 7.23 — ABNORMAL LOW (ref 7.350–7.450)
pH, Arterial: 7.27 — ABNORMAL LOW (ref 7.350–7.450)
pO2, Arterial: 135 mmHg — ABNORMAL HIGH (ref 83.0–108.0)
pO2, Arterial: 117 mmHg — ABNORMAL HIGH (ref 83.0–108.0)

## 2021-10-31 NOTE — Death Summary Note (Signed)
DEATH SUMMARY   Patient Details  Name: Eddie Chapman MRN: [id number] DOB: [birth year]  Admission/Discharge Information   Admit Date:  26-Oct-2021  Date of Death: Date of Death: Oct 28, 2021  Time of Death: Time of Death: 0713  Length of Stay: 2  Referring Physician: Adin Hector, MD   Reason(s) for Hospitalization  Acute on Chronic Hypoxic & Hypercapnic Respiratory Failure Cardiac Arrest Multifactorial Circulatory Shock Elevated Troponin Community Acquired Pneumonia Obstructive Sleep Apnea Obesity Hypoventilation Syndrome Pulmonary Hypertension HFrEF Lactic Acidosis Diabetes Mellitus with Hyperglycemia  Diagnoses  Preliminary cause of death:  Cardiac Arrest Secondary Diagnoses (including complications and co-morbidities):  Principal Problem:   Acute on chronic respiratory failure with hypercapnia (HCC) Multifactorial Circulatory Shock Elevated Troponin Community Acquired Pneumonia Obstructive Sleep Apnea Obesity Hypoventilation Syndrome Pulmonary Hypertension Chronic HFrEF Lactic Acidosis Diabetes Mellitus with Hyperglycemia  GERD Depression  Brief Hospital Course (including significant findings, care, treatment, and services provided and events leading to death)  Eddie Chapman is a 61 year old male with significant history of multiple admissions due to acute on chronic hypoxic and hypercapnic respiratory failure with chronic tracheostomy in place presenting from home with partner to [facility] ED on October 26, 2021 due to increased shortness of breath and somnolence correlating with self-reported noncompliance with nighttime home ventilator support.  Patient currently on trach collar during the daytime and is prescribed nighttime full ventilator support due to chronic CO2 retention.  Patient is unsure how often he wears the ventilator at night, per his partner who is bedside she confirms that he was not wearing it regularly after last discharge in September 2022, but recently has been  refusing due to discomfort it causes while sleeping.  The patient's partner reports that he has been sleepy and not eating/drinking well over the past 3-4 days, which the patient confirms. They both deny fevers/chills, no productive cough/chest pain, abdominal pain/diarrhea/dysuria.   We discussed at length the importance of wearing his oxygen support as prescribed.   ED course: Patient was placed on ventilator support via his chronic tracheostomy, suctioning produced significant secretions and both interventions improved his lethargy & vitals. Medications given: 1 L LR bolus, cefepime & vancomycin Initial Vitals: Afebrile 98.3, tachypneic at 40, tachycardic at 126, BP 127/92 and SPO2 94% on ventilator support 40% FiO2 Significant labs: (Labs/ Imaging personally reviewed) I, Domingo Pulse [place], AGACNP-BC, personally viewed and interpreted this ECG. EKG Interpretation: Date: Oct 26, 2021, EKG Time: 1642, Rate: 116, Rhythm: ST, QRS Axis: Normal, Intervals: Normal, ST/T Wave abnormalities: None, Narrative Interpretation: Sinus tachycardia Chemistry: Cl: 89, Serum CO2/ AG: 41/8 Hematology: WBC: 11.7, Hgb: 13.6,  Troponin: 22 > 22, Lactic/ PCT: 2.0/ pending, COVID-19 & Influenza A/B: negative VBG: 7.22/ >120/ 53/ 51.6 CXR Oct 26, 2021: Patchy airspace opacities with query cavitary lesion within the right lung, likely bilateral trace pleural effusions with question of nodularity on the left CT chest with contrast Oct 26, 2021: pending   PCCM consulted for admission due to chronic tracheostomy with ventilator support.  On 10/02/21 While boarding in the ED awaiting ICU bed, patient went into cardiac arrest. Called to bedside at 1037. Per RN, last seen awake but disoriented 15 minutes prior. RN reports patients vital signs were stable at that time. Patient then pulled off vent tubing and RT came to bedside and patient reportedly did not look well and RN called back to bedside and patient was pulseless and CPR was  initiated.Multiple rounds of CPR including defibrillation at least 4 times.   CODE NOTE: Called to bedside at 1037 due  to patient going into cardiac arrest and actively receiving CPR.    Upon arrival to bedside, CPR in progress and patient had received 1 round of Epi at that time with an initial rhythm being PEA prior to chest compressions.   Per bedside RN, the patient was examined 15 minutes prior to arrest and was awake, disoriented with stable vital signs. RT came to bedside due to patient disconnecting himself from the vent, RN was called to bedside due to patient being unresponsive. Patient was pulseless and CPR was initiated.   AM labs were reviewed from 0700 this AM. Essentially unremarkable.   Patient required multiple rounds of CPR and Defibrillations. Initial rhythm was PEA followed by V-fib x3. ROSC was achieved briefly following 3rd shock, but then quickly went into pulseless Vtach requiring additional defibrillation. Following rhythms were PEA until ROSC achieved at 1106. Rhythm post ROSC was Sinus tachycardia   Treatment throughout code included: IO access, 6 Epi, 300 Amio, 1Bicarb, 1 calcium chloride, 1L LR bolus, Initiation of a Levophed gtt at 73mcg following brief ROSC prior to arresting again   Post ROSC treatment included: --Emergent central line placement in the RIJ due to poor IV access on vasopressors --Levophed gtt at 37mcg --2 rounds of push dose Epi followed by Epi gtt at 3 --Patient transferred to ICU   Please see "Significant Hospital Events" section below for full detailed hospital course.   Significant Hospital Events: Including procedures, antibiotic start and stop dates in addition to other pertinent events   10/15/2021: Admitted to ICU with acute on chronic hypercapnic respiratory failure on ventilator support 10/02/21: Cardiac Arrest. Prolonged downtime of 30 minutes.  Code status changed to DNR. 10/02/21: Central line and Arterial Line placed 10/16/2021: Continues  to deteriorate despite ongoing aggressive treatment.  Family elected to transition to transition to [place] and to withdraw all life sustaining treatments.  Pt Passed away shortly after transition to comfort measures.  Pertinent Labs and Studies  Significant Diagnostic Studies CT CHEST W CONTRAST  Result Date: 09/30/2021 CLINICAL DATA:  Abnormal chest x-ray, sepsis, pneumonia EXAM: CT CHEST WITH CONTRAST TECHNIQUE: Multidetector CT imaging of the chest was performed during intravenous contrast administration. CONTRAST:  67mL OMNIPAQUE IOHEXOL 300 MG/ML  SOLN COMPARISON:  09/25/2020, 10/22/2021, 05/02/2014 FINDINGS: Cardiovascular: The heart and great vessels are unremarkable without pericardial effusion. No evidence of thoracic aortic aneurysm or dissection. Atherosclerosis of the aortic arch and coronary vasculature. Mediastinum/Nodes: Tracheostomy tube, tip at level of thoracic inlet. Thyroid and esophagus are unremarkable. No pathologic adenopathy. Lungs/Pleura: There is dense left upper lobe consolidation with associated volume loss. No effusion or pneumothorax.  Central airways are patent. Upper Abdomen: Diffuse hepatic steatosis. Stable 3.8 cm right adrenal mass, indeterminate. Musculoskeletal: No acute or destructive bony lesions. Chronic T5 and T8 compression deformities. Reconstructed images demonstrate no additional findings. IMPRESSION: 1. Dense left upper lobe consolidation and associated volume loss, favor atelectasis over pneumonia. No evidence of central obstructive lesion. Radiographic follow-up is recommended. If consolidation does not resolve, bronchoscopy may be useful. 2. 3.8 cm right adrenal mass, indeterminate by Hounsfield attenuation. This mass has been present since 2015, though has enlarged slightly. Favor adrenal adenoma. If definitive evaluation is desired, and has not been previously performed, dedicated adrenal MRI could be considered. 3. Hepatic steatosis. 4. Aortic  Atherosclerosis (ICD10-I70.0). Coronary artery atherosclerosis. Electronically Signed   By: Randa Ngo M.D.   On: 10/04/2021 22:32   DG Chest Portable 1 View  Result Date: 10/02/2021 CLINICAL DATA:  61 year old male central line placement. EXAM: PORTABLE CHEST 1 VIEW COMPARISON:  Chest CT and portable chest yesterday. FINDINGS: Portable AP semi upright view at levin 54 hours. Rotated to the left similar to the recent CT. Stable tracheostomy. New right IJ approach central line, tip is at the upper SVC level above the carina. No pneumothorax. Lung volumes and ventilation appears stable since the portable chest yesterday when allowing for rotation. IMPRESSION: 1. Rotated portable chest. New right IJ approach central line with tip at the upper SVC level. 2. No right pneumothorax.  Stable ventilation from the CT yesterday. Electronically Signed   By: Genevie Ann M.D.   On: 10/02/2021 12:01   DG Chest Port 1 View  Result Date: 10/16/2021 CLINICAL DATA:  Questionable sepsis - evaluate for abnormality EXAM: PORTABLE CHEST 1 VIEW COMPARISON:  Chest x-ray 06/22/2021, CT chest 09/25/2020 FINDINGS: Tracheostomy tube with tip terminating approximately 1.5 cm above the carina. The heart and mediastinal contours are within normal limits. Low lung volumes. Patchy airspace opacities. Query cavitary lesion within the right lung. No pulmonary edema. Likely bilateral trace pleural effusions with question of nodularity on the left. No pneumothorax. No acute osseous abnormality. IMPRESSION: 1. Patchy airspace opacities with query cavitary lesion within the right lung 2. Likely bilateral trace pleural effusions with question of nodularity on the left. 3. Recommend CT chest with intravenous contrast (not CTPA due to timing of contrast) for further evaluation. Electronically Signed   By: Iven Finn M.D.   On: 09/30/2021 17:27    Microbiology Recent Results (from the past 240 hour(s))  Resp Panel by RT-PCR (Flu A&B, Covid)  Nasopharyngeal Swab     Status: None   Collection Time: 10/25/2021  4:56 PM   Specimen: Nasopharyngeal Swab; Nasopharyngeal(NP) swabs in vial transport medium  Result Value Ref Range Status   SARS Coronavirus 2 by RT PCR NEGATIVE NEGATIVE Final    Comment: (NOTE) SARS-CoV-2 target nucleic acids are NOT DETECTED.  The SARS-CoV-2 RNA is generally detectable in upper respiratory specimens during the acute phase of infection. The lowest concentration of SARS-CoV-2 viral copies this assay can detect is 138 copies/mL. A negative result does not preclude SARS-Cov-2 infection and should not be used as the sole basis for treatment or other patient management decisions. A negative result may occur with  improper specimen collection/handling, submission of specimen other than nasopharyngeal swab, presence of viral mutation(s) within the areas targeted by this assay, and inadequate number of viral copies(<138 copies/mL). A negative result must be combined with clinical observations, patient history, and epidemiological information. The expected result is Negative.  Fact Sheet for Patients:  EntrepreneurPulse.com.au  Fact Sheet for Healthcare Providers:  IncredibleEmployment.be  This test is no t yet approved or cleared by the [place] FDA and  has been authorized for detection and/or diagnosis of SARS-CoV-2 by FDA under an Emergency Use Authorization (EUA). This EUA will remain  in effect (meaning this test can be used) for the duration of the COVID-19 declaration under Section 564(b)(1) of the Act, 21 U.S.C.section 360bbb-3(b)(1), unless the authorization is terminated  or revoked sooner.       Influenza A by PCR NEGATIVE NEGATIVE Final   Influenza B by PCR NEGATIVE NEGATIVE Final    Comment: (NOTE) The Xpert Xpress SARS-CoV-2/FLU/RSV plus assay is intended as an aid in the diagnosis of influenza from Nasopharyngeal swab specimens and should not be  used as a sole basis for treatment. Nasal washings and aspirates are unacceptable for Xpert Xpress  SARS-CoV-2/FLU/RSV testing.  Fact Sheet for Patients: EntrepreneurPulse.com.au  Fact Sheet for Healthcare Providers: IncredibleEmployment.be  This test is not yet approved or cleared by the [place] FDA and has been authorized for detection and/or diagnosis of SARS-CoV-2 by FDA under an Emergency Use Authorization (EUA). This EUA will remain in effect (meaning this test can be used) for the duration of the COVID-19 declaration under Section 564(b)(1) of the Act, 21 U.S.C. section 360bbb-3(b)(1), unless the authorization is terminated or revoked.  Performed at [facility], [place], [place] [address]   Respiratory (~20 pathogens) panel by PCR     Status: None   Collection Time: 10/30/2021  4:56 PM   Specimen: Nasopharyngeal Swab; Respiratory  Result Value Ref Range Status   Adenovirus NOT DETECTED NOT DETECTED Final   Coronavirus 229E NOT DETECTED NOT DETECTED Final    Comment: (NOTE) The Coronavirus on the Respiratory Panel, DOES NOT test for the novel  Coronavirus (2019 nCoV)    Coronavirus HKU1 NOT DETECTED NOT DETECTED Final   Coronavirus NL63 NOT DETECTED NOT DETECTED Final   Coronavirus OC43 NOT DETECTED NOT DETECTED Final   Metapneumovirus NOT DETECTED NOT DETECTED Final   Rhinovirus / Enterovirus NOT DETECTED NOT DETECTED Final   Influenza A NOT DETECTED NOT DETECTED Final   Influenza B NOT DETECTED NOT DETECTED Final   Parainfluenza Virus 1 NOT DETECTED NOT DETECTED Final   Parainfluenza Virus 2 NOT DETECTED NOT DETECTED Final   Parainfluenza Virus 3 NOT DETECTED NOT DETECTED Final   Parainfluenza Virus 4 NOT DETECTED NOT DETECTED Final   Respiratory Syncytial Virus NOT DETECTED NOT DETECTED Final   Bordetella pertussis NOT DETECTED NOT DETECTED Final   Bordetella Parapertussis NOT DETECTED NOT DETECTED  Final   Chlamydophila pneumoniae NOT DETECTED NOT DETECTED Final   Mycoplasma pneumoniae NOT DETECTED NOT DETECTED Final    Comment: Performed at [facility], [place]. [address] [place], [place] [address]  Blood Culture (routine x 2)     Status: None (Preliminary result)   Collection Time: 10/10/2021  5:10 PM   Specimen: BLOOD  Result Value Ref Range Status   Specimen Description BLOOD RIGHT ANTECUBITAL  Final   Special Requests   Final    BOTTLES DRAWN AEROBIC AND ANAEROBIC Blood Culture results may not be optimal due to an inadequate volume of blood received in culture bottles   Culture   Final    NO GROWTH 2 DAYS Performed at [facility], [address] [place], [place] [address]    Report Status PENDING  Incomplete  Blood Culture (routine x 2)     Status: None (Preliminary result)   Collection Time: 10/29/2021  5:15 PM   Specimen: BLOOD  Result Value Ref Range Status   Specimen Description BLOOD BLOOD LEFT HAND  Final   Special Requests   Final    BOTTLES DRAWN AEROBIC AND ANAEROBIC Blood Culture results may not be optimal due to an inadequate volume of blood received in culture bottles   Culture   Final    NO GROWTH 2 DAYS Performed at [facility], [address], [place], [place] [address]    Report Status PENDING  Incomplete  MRSA Next Gen by PCR, Nasal     Status: Abnormal   Collection Time: 10/02/21 11:57 AM   Specimen: Nasal Mucosa; Nasal Swab  Result Value Ref Range Status   MRSA by PCR Next Gen DETECTED (A) NOT DETECTED Corrected    Comment:  RESULT CALLED TO, READ BACK BY AND VERIFIED WITH: SHELLY FIELDS 10/02/21 1359 MU (NOTE) The GeneXpert MRSA Assay (FDA approved for NASAL specimens only), is one component of a comprehensive MRSA colonization surveillance program. It is not intended to diagnose MRSA infection nor to guide or monitor treatment for MRSA infections. Test performance is not FDA approved in patients less than 43  years old. Performed at [facility], [address] [place], [place] [address] CORRECTED ON 01/03 AT 1402: PREVIOUSLY REPORTED AS DETECTED RESULT CALLED TO, READ BACK BY AND VERIFIED WITH: SHELLY FIELDS 10/02/21 359 MU     Lab Basic Metabolic Panel: Recent Labs  Lab 10/21/2021 1840 10/05/2021 2200 10/02/21 0702 10/02/21 1157 10/02/21 1632  NA 138  --  141 136 137  K 4.6  --  4.3 5.0 4.3  CL 89*  --  92* 90* 92*  CO2 41*  --  41* 24 28  GLUCOSE 109*  --  116* 300* 329*  BUN 17  --  19 22 34*  CREATININE 0.77  --  0.82 1.47* 2.16*  CALCIUM 8.8*  --  8.8* 9.5 9.0  MG  --  2.0 1.9 2.9*  --   PHOS  --  2.5 2.1*  --   --    Liver Function Tests: Recent Labs  Lab 10/28/2021 1840  AST 28  ALT 36  ALKPHOS 44  BILITOT 0.5  PROT 7.1  ALBUMIN 3.5   No results for input(s): LIPASE, AMYLASE in the last 168 hours. No results for input(s): AMMONIA in the last 168 hours. CBC: Recent Labs  Lab 10/18/2021 1656 10/02/21 0702 10/02/21 1157  WBC 11.7* 13.5* 23.9*  NEUTROABS 8.9*  --   --   HGB 13.6 12.4* 13.2  HCT 43.7 39.2 43.0  MCV 101.2* 97.8 102.1*  PLT 145* 168 209   Cardiac Enzymes: No results for input(s): CKTOTAL, CKMB, CKMBINDEX, TROPONINI in the last 168 hours. Sepsis Labs: Recent Labs  Lab 10/27/2021 1656 10/28/2021 1840 10/22/2021 2200 10/02/21 0702 10/02/21 1157 10/02/21 1159 10/02/21 1724 10/02/21 2146 10/02/21 2341  PROCALCITON  --  <0.10  --  0.10  --   --   --   --   --   WBC 11.7*  --   --  13.5* 23.9*  --   --   --   --   LATICACIDVEN 2.0*  --    < >  --   --  >9.0* 7.9* 6.7* 5.9*   < > = values in this interval not displayed.    Procedures/Operations  10/02/21: Intubation 10/02/21: Central line placed 10/02/21: Arterial line placed     Bradly Bienenstock October 31, 2021, 8:47 AM  Darel Hong, AGACNP-BC [facility] Prefer epic messenger for cross cover needs If after hours, please call E-link

## 2021-10-31 NOTE — Progress Notes (Signed)
Pt Passed peacefully at [phone], pronounced by 2 RN's. MD notified of time of death. No belongings present. Bubba Hales notified. CDS made aware of time of death.

## 2021-10-31 NOTE — Progress Notes (Addendum)
GOALS OF CARE     Advance Care Planning/Goals of Care discussion was performed during the course of treatment to decide on type of care right for this patient following acute change in patient status. Patient continues to deteriorate despite maximized therapy.   I discussed with patient's significant other who is also the point of contact. Patient apparently has a father who has never been in his life. I discussed goals of care in details following review of patient's lab and diagnostic data and patient's progress notes with the point of contact and answered all their question.   Discussed prognosis, expected outcome with or without ongoing aggressive treatments and the options for de-escalation of care.   Diagnosis(es): Acute respiratory failure with severe hypercapnia -cardiac arrest s/p ACLS OHS -severe OSA -severe depression  -suicidal ideation  -morbid obesity  - trache dependent respiratory failure Prognosis: Poor Code Status: DNR Disposition: ICU Next Steps: Family understands the situation. They have consented and agreed to DNR/DNI and would wish to STOP any aggressive treatment and transition to full comfort care including terminal extubation.   Family are satisfied with Plan of action and management. All questions answered     Total Time Spent Face to Face addressing advance care planning in the presence of the Patient: 25 minutes       Rufina Falco, DNP, FNP-C, AGACNP-BC Acute Care Nurse Practitioner [facility] Pager: [phone] [place] at [facility]

## 2021-10-31 DEATH — deceased

## 2023-03-15 IMAGING — DX DG CHEST 1V PORT
1 series · 1 of 1 positions shown · non-contrast
Comparison: Single-view of the chest 10/10/2020 and 09/30/2020. CT
chest 09/25/2020.

CLINICAL DATA: Respiratory distress.

EXAM:
PORTABLE CHEST 1 VIEW

[chest ap]
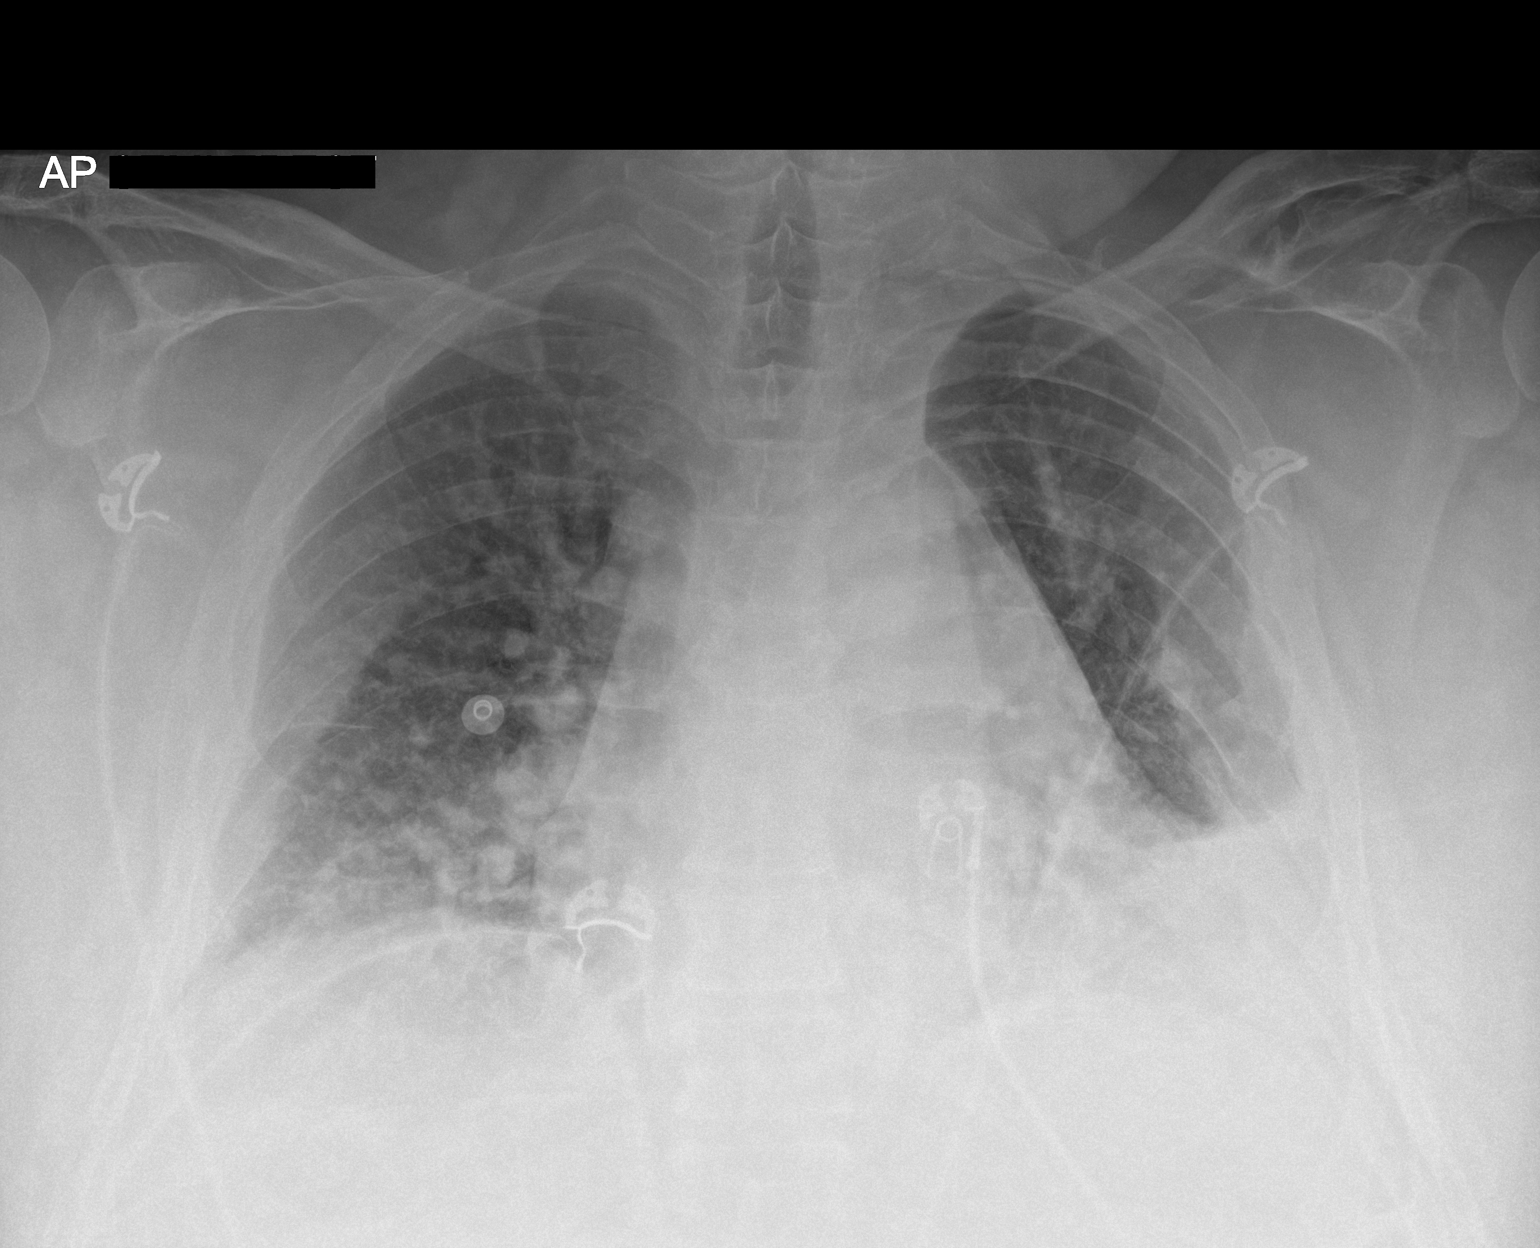

[1 of 1 positions shown; findings below may reference images not displayed]

FINDINGS: Lung volumes are low. There is cardiomegaly and interstitial edema.
Hazy opacity in the left lung base is unchanged and most consistent
with volume loss and prominent epicardial fat as seen on prior CT.
No pneumothorax or pleural fluid. No acute or focal bony
abnormality.
IMPRESSION: Cardiomegaly and interstitial edema.

## 2023-03-15 IMAGING — DX DG CHEST 1V PORT
2 series · 2 of 2 positions shown · non-contrast
Comparison: Radiograph earlier today., Chest CT 09/26/2019

CLINICAL DATA: Intubation and OG tube placement.

EXAM:
PORTABLE CHEST 1 VIEW

[chest ap (1 of 2)]
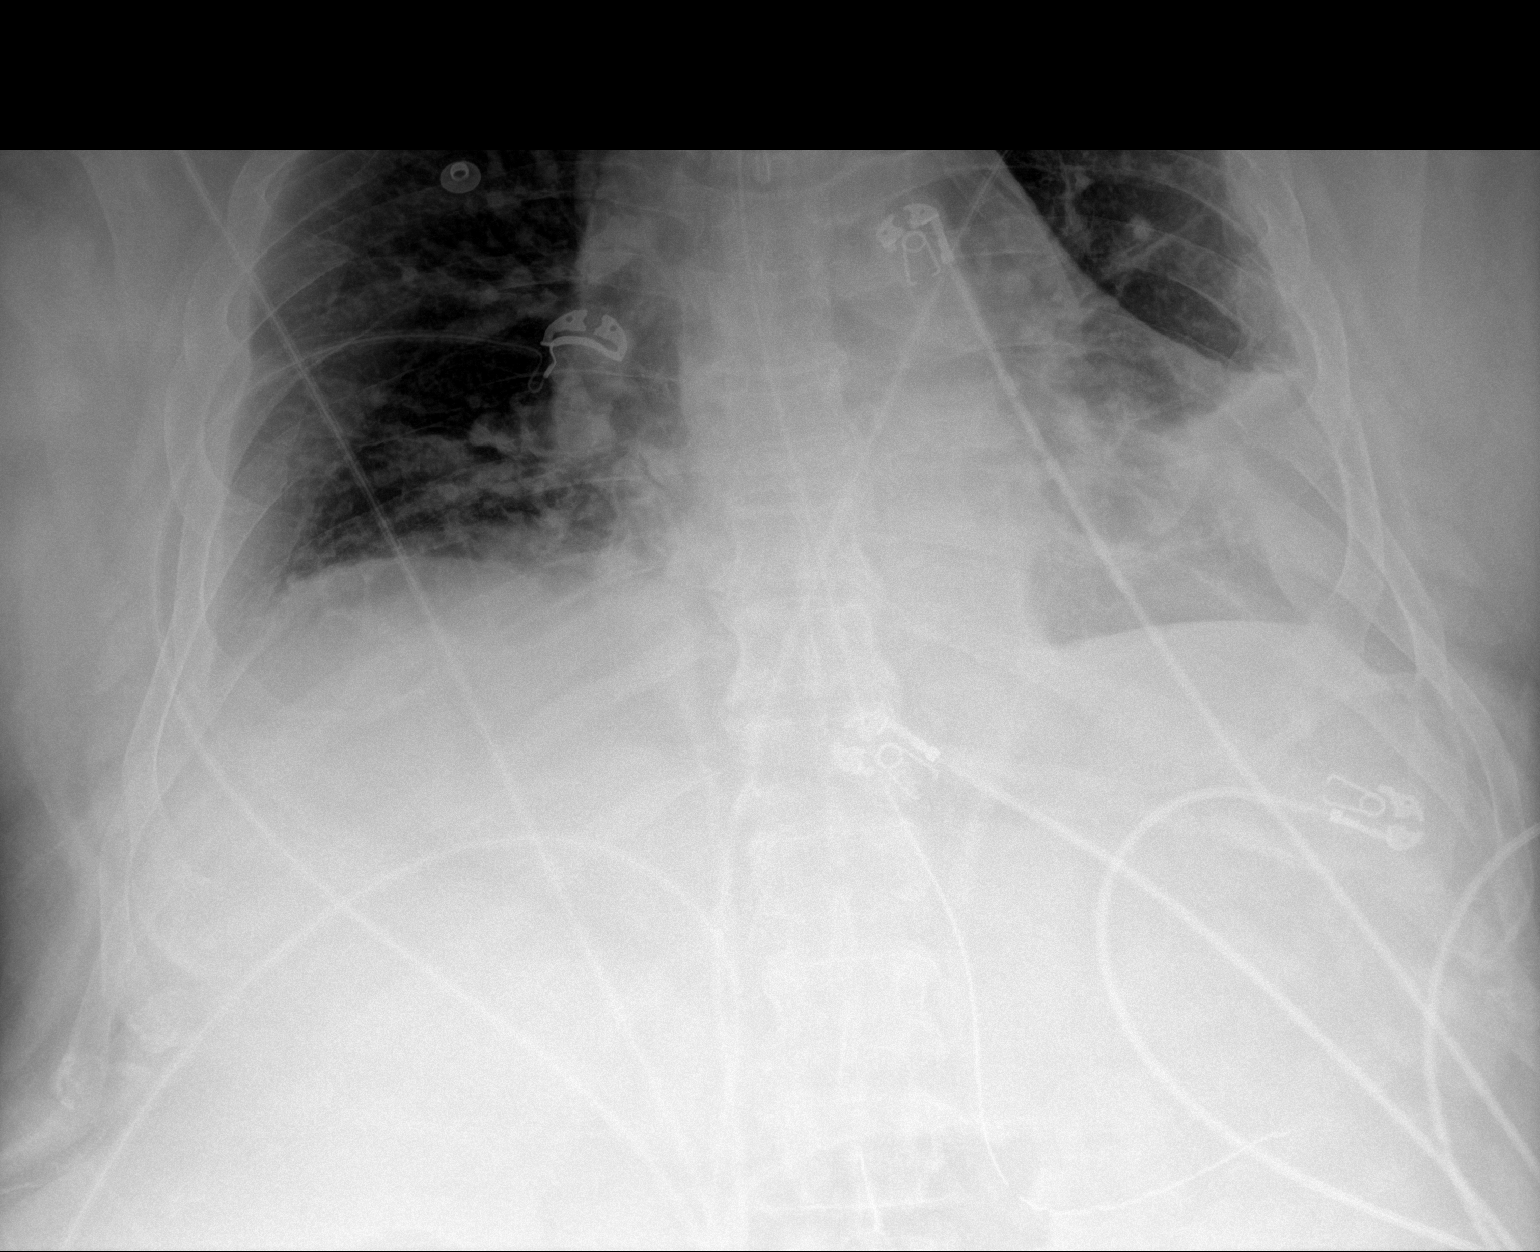

[chest ap (2 of 2)]
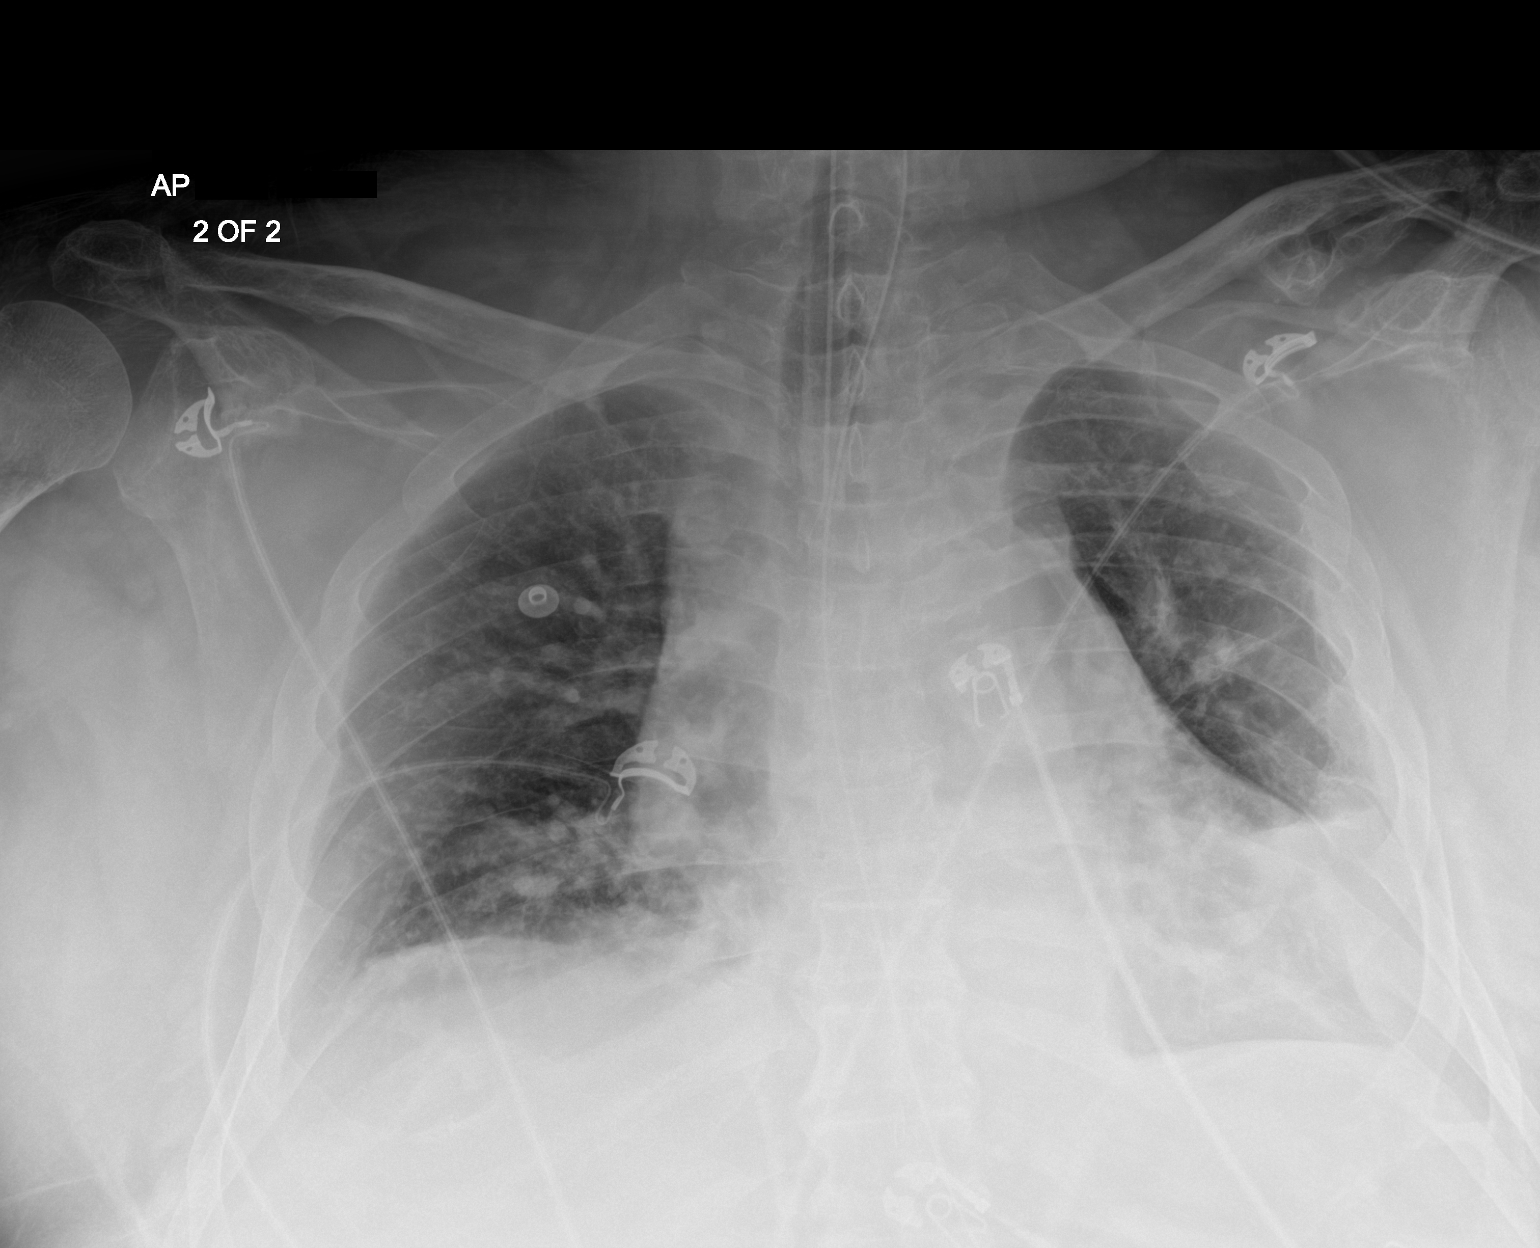

[2 of 2 positions shown; findings below may reference images not displayed]

FINDINGS: Endotracheal tube tip is approximately 4.3 cm from the carina. Tip
and side port of the enteric tube below the diaphragm in the
stomach. Persistent low lung volumes. Stable cardiomegaly. Stable
left basilar opacity, likely combination of volume loss and
epicardial fat pad is seen on prior exam. Interstitial edema with
mild improvement. No pneumothorax.
IMPRESSION: 1. Endotracheal tube tip approximately 4.3 cm from the carina.
Enteric tube in place with tip below the diaphragm in the stomach.
2. Cardiomegaly with improving interstitial edema.
3. Unchanged left basilar opacity, likely combination of volume loss
and epicardial fat pad when compared with prior CT.

## 2023-04-05 IMAGING — DX DG ABDOMEN 1V
1 series · 1 of 1 positions shown · non-contrast
Comparison: March 07, 2021

CLINICAL DATA: Nasogastric tube placement

EXAM:
ABDOMEN - 1 VIEW

[abdomen supine]
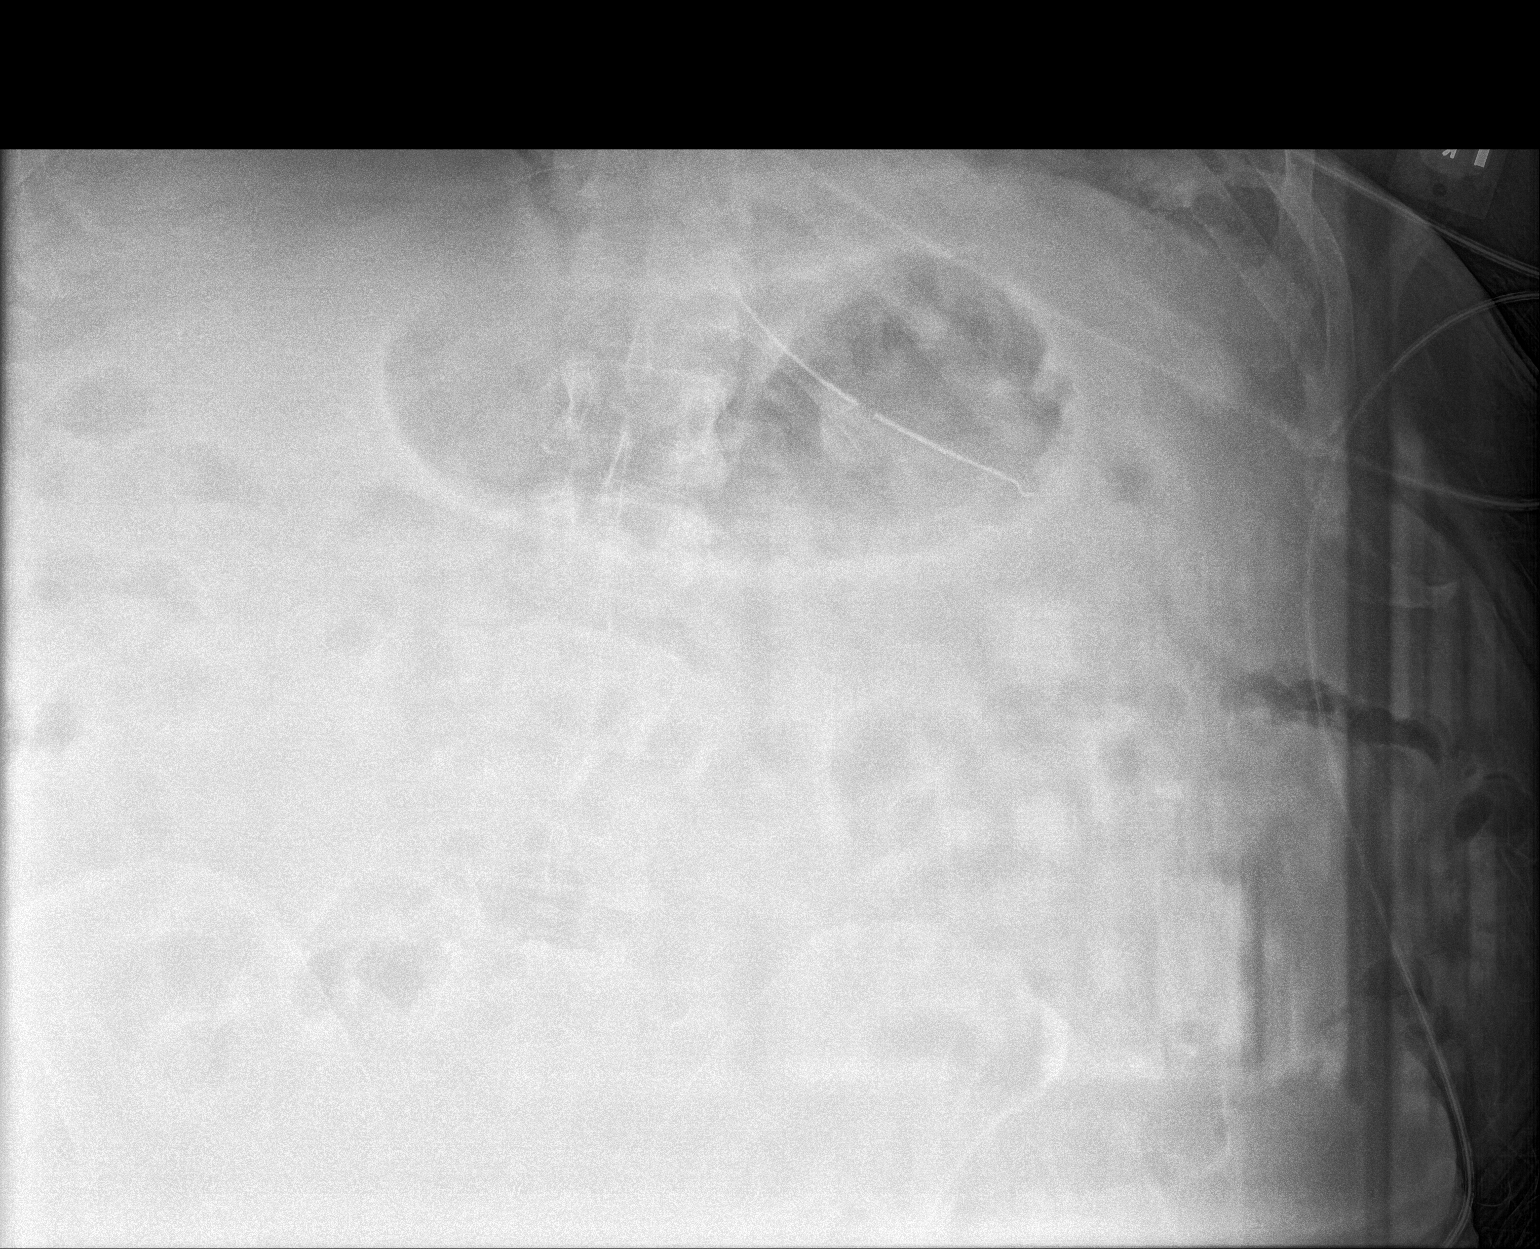

[1 of 1 positions shown; findings below may reference images not displayed]

FINDINGS: Nasogastric tube tip and side port in proximal stomach. There is no
bowel dilatation or air-fluid level to suggest bowel obstruction. No
free air. Visualized lung bases clear.
IMPRESSION: Nasogastric tube tip and side port in proximal stomach. No bowel
obstruction or free air evident on semi erect image.

## 2023-04-09 IMAGING — DX DG ABDOMEN 1V
2 series · 2 of 2 positions shown · non-contrast
Comparison: 03/16/2021

CLINICAL DATA: Abdominal pain and distension

EXAM:
ABDOMEN - 1 VIEW

[abdomen supine (1 of 2)]
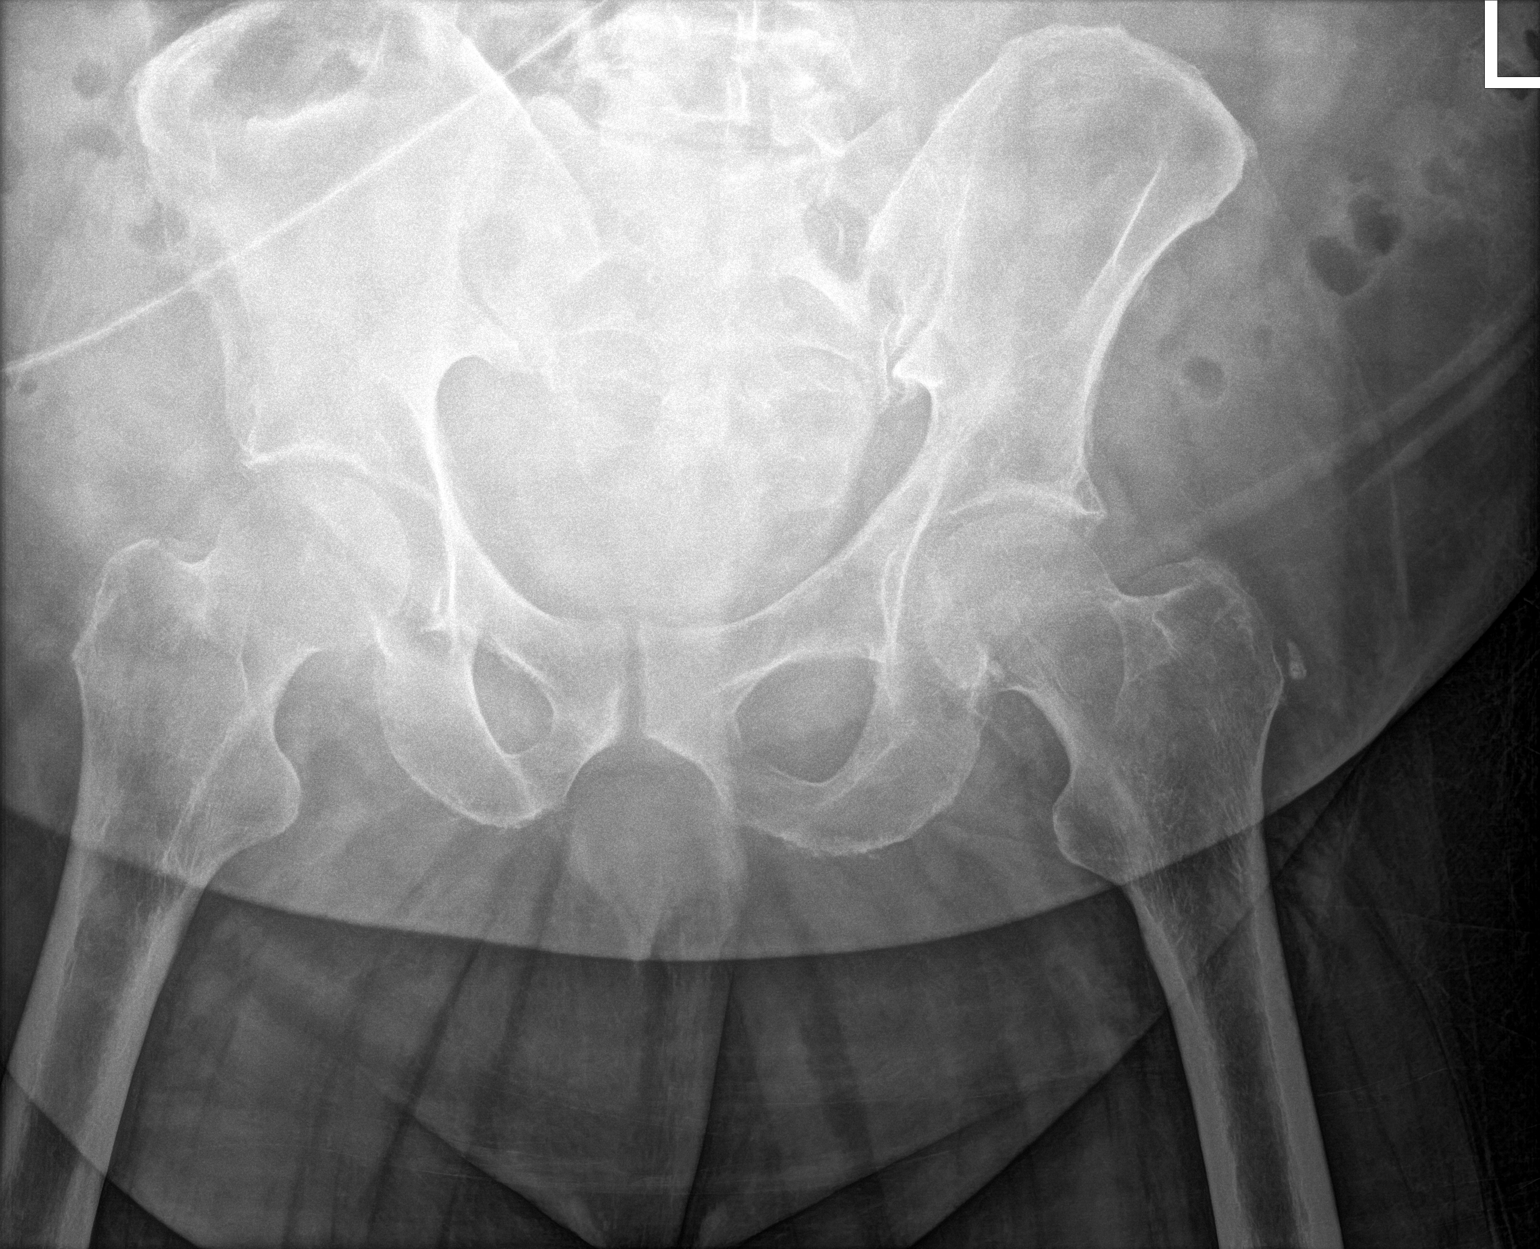

[abdomen supine (2 of 2)]
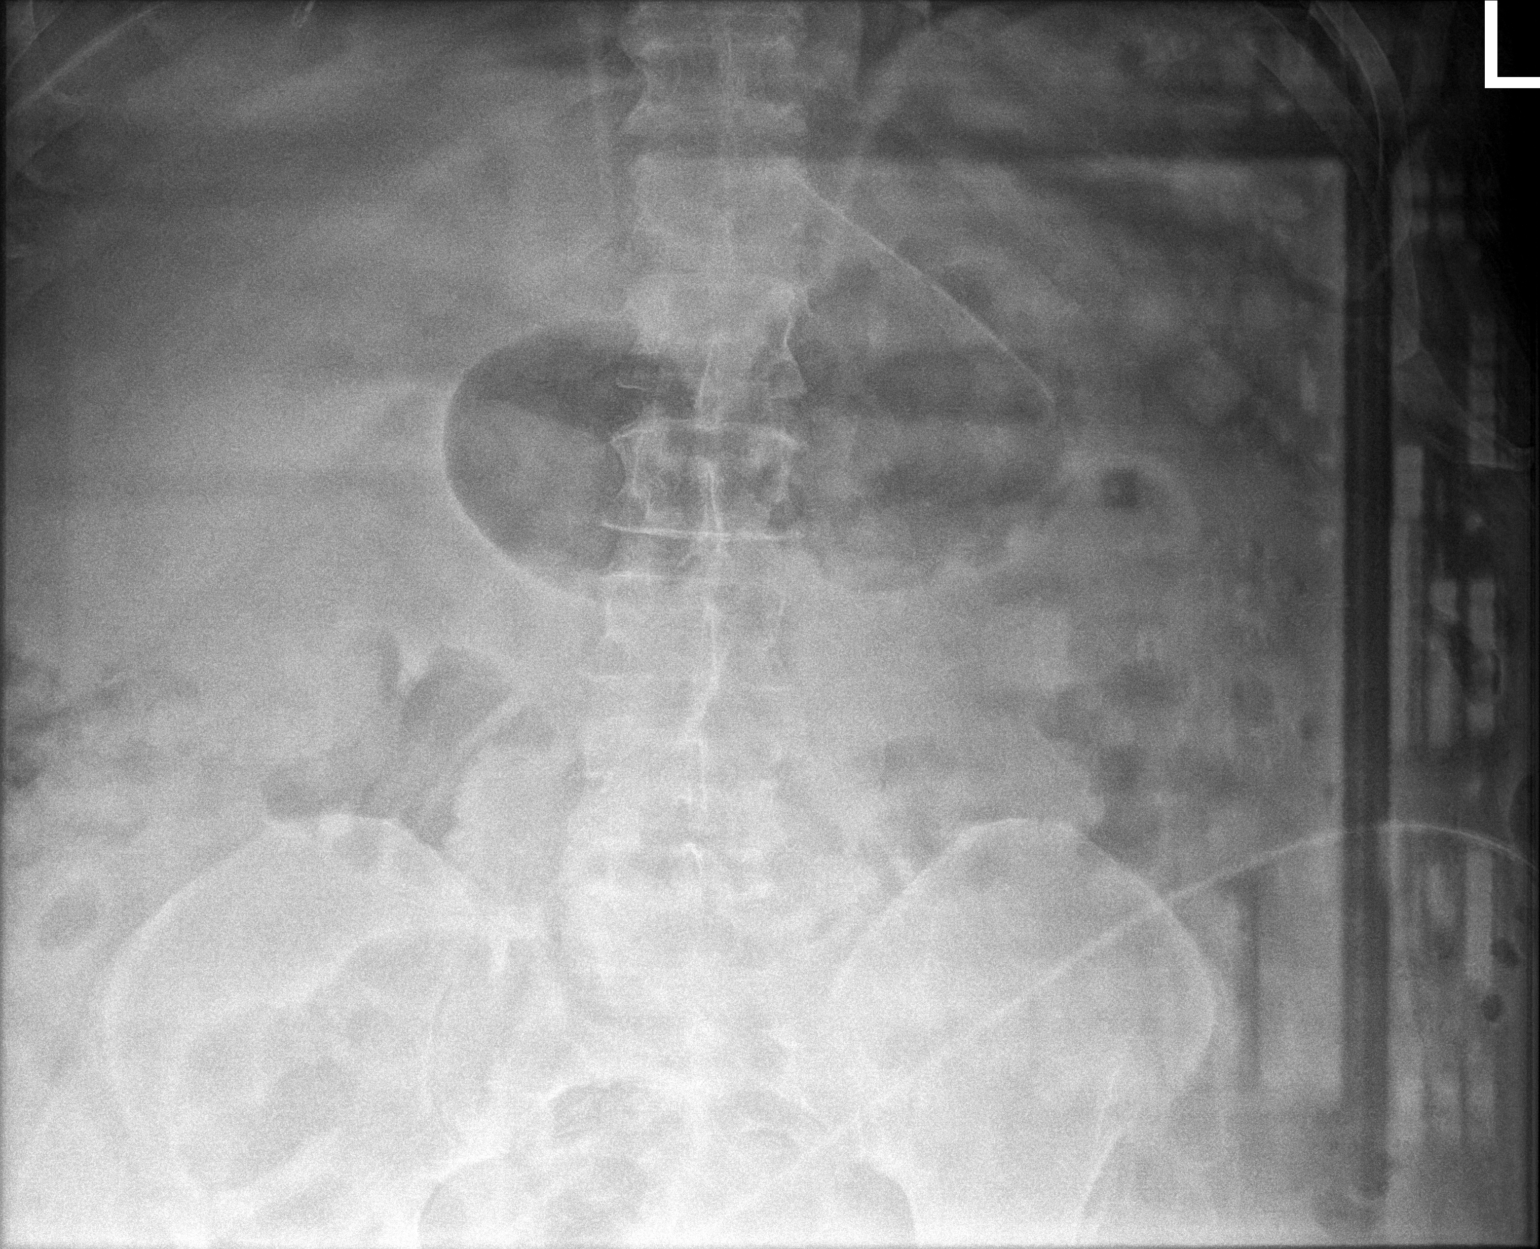

[2 of 2 positions shown; findings below may reference images not displayed]

FINDINGS: Tip of nasogastric tube projects over stomach.

Normal bowel gas pattern.

No bowel dilatation or bowel wall thickening.

Osseous structures unremarkable.
IMPRESSION: No acute abnormalities.
